# Patient Record
Sex: Female | Born: 1945
Health system: Southern US, Community
[De-identification: ages and names within clinical notes are randomized; demographics above are authoritative.]

## PROBLEM LIST (undated history)

## (undated) DIAGNOSIS — E538 Deficiency of other specified B group vitamins: Secondary | ICD-10-CM

## (undated) DIAGNOSIS — Z803 Family history of malignant neoplasm of breast: Secondary | ICD-10-CM

## (undated) DIAGNOSIS — K317 Polyp of stomach and duodenum: Secondary | ICD-10-CM

## (undated) DIAGNOSIS — C439 Malignant melanoma of skin, unspecified: Secondary | ICD-10-CM

## (undated) DIAGNOSIS — F32A Depression, unspecified: Secondary | ICD-10-CM

## (undated) DIAGNOSIS — Z8619 Personal history of other infectious and parasitic diseases: Secondary | ICD-10-CM

## (undated) DIAGNOSIS — T7840XA Allergy, unspecified, initial encounter: Secondary | ICD-10-CM

## (undated) DIAGNOSIS — F419 Anxiety disorder, unspecified: Secondary | ICD-10-CM

## (undated) DIAGNOSIS — L900 Lichen sclerosus et atrophicus: Secondary | ICD-10-CM

## (undated) DIAGNOSIS — M549 Dorsalgia, unspecified: Secondary | ICD-10-CM

## (undated) DIAGNOSIS — T4145XA Adverse effect of unspecified anesthetic, initial encounter: Secondary | ICD-10-CM

## (undated) DIAGNOSIS — C50912 Malignant neoplasm of unspecified site of left female breast: Secondary | ICD-10-CM

## (undated) DIAGNOSIS — C50911 Malignant neoplasm of unspecified site of right female breast: Secondary | ICD-10-CM

## (undated) DIAGNOSIS — G8929 Other chronic pain: Secondary | ICD-10-CM

## (undated) DIAGNOSIS — M1990 Unspecified osteoarthritis, unspecified site: Secondary | ICD-10-CM

## (undated) DIAGNOSIS — E559 Vitamin D deficiency, unspecified: Secondary | ICD-10-CM

## (undated) DIAGNOSIS — Z800 Family history of malignant neoplasm of digestive organs: Secondary | ICD-10-CM

## (undated) DIAGNOSIS — Z8719 Personal history of other diseases of the digestive system: Secondary | ICD-10-CM

## (undated) DIAGNOSIS — K219 Gastro-esophageal reflux disease without esophagitis: Secondary | ICD-10-CM

## (undated) DIAGNOSIS — F329 Major depressive disorder, single episode, unspecified: Secondary | ICD-10-CM

## (undated) DIAGNOSIS — Z86010 Personal history of colonic polyps: Secondary | ICD-10-CM

## (undated) DIAGNOSIS — N200 Calculus of kidney: Secondary | ICD-10-CM

## (undated) DIAGNOSIS — T8859XA Other complications of anesthesia, initial encounter: Secondary | ICD-10-CM

## (undated) DIAGNOSIS — R0600 Dyspnea, unspecified: Secondary | ICD-10-CM

## (undated) DIAGNOSIS — D759 Disease of blood and blood-forming organs, unspecified: Secondary | ICD-10-CM

## (undated) HISTORY — PX: COLONOSCOPY: SHX174

## (undated) HISTORY — DX: Personal history of other infectious and parasitic diseases: Z86.19

## (undated) HISTORY — DX: Dyspnea, unspecified: R06.00

## (undated) HISTORY — DX: Gastro-esophageal reflux disease without esophagitis: K21.9

## (undated) HISTORY — DX: Polyp of stomach and duodenum: K31.7

## (undated) HISTORY — DX: Family history of malignant neoplasm of breast: Z80.3

## (undated) HISTORY — DX: Major depressive disorder, single episode, unspecified: F32.9

## (undated) HISTORY — DX: Personal history of colonic polyps: Z86.010

## (undated) HISTORY — DX: Depression, unspecified: F32.A

## (undated) HISTORY — PX: HERNIA REPAIR: SHX51

## (undated) HISTORY — DX: Lichen sclerosus et atrophicus: L90.0

## (undated) HISTORY — PX: ESOPHAGOGASTRODUODENOSCOPY: SHX1529

## (undated) HISTORY — PX: BUNIONECTOMY: SHX129

## (undated) HISTORY — DX: Family history of malignant neoplasm of digestive organs: Z80.0

## (undated) HISTORY — DX: Allergy, unspecified, initial encounter: T78.40XA

## (undated) HISTORY — DX: Vitamin D deficiency, unspecified: E55.9

## (undated) HISTORY — DX: Malignant melanoma of skin, unspecified: C43.9

## (undated) HISTORY — DX: Deficiency of other specified B group vitamins: E53.8

## (undated) HISTORY — PX: TONSILLECTOMY: SUR1361

## (undated) HISTORY — DX: Calculus of kidney: N20.0

---

## 1985-05-27 HISTORY — PX: TEMPOROMANDIBULAR JOINT SURGERY: SHX35

## 1993-05-27 DIAGNOSIS — C50911 Malignant neoplasm of unspecified site of right female breast: Secondary | ICD-10-CM

## 1993-05-27 HISTORY — DX: Malignant neoplasm of unspecified site of right female breast: C50.911

## 1994-05-27 HISTORY — PX: MASTECTOMY: SHX3

## 1999-12-31 ENCOUNTER — Encounter: Admission: RE | Admit: 1999-12-31 | Discharge: 1999-12-31 | Payer: Self-pay | Admitting: Oncology

## 1999-12-31 ENCOUNTER — Encounter: Payer: Self-pay | Admitting: Oncology

## 2000-01-09 ENCOUNTER — Other Ambulatory Visit: Admission: RE | Admit: 2000-01-09 | Discharge: 2000-01-09 | Payer: Self-pay | Admitting: Obstetrics and Gynecology

## 2001-01-01 ENCOUNTER — Encounter: Payer: Self-pay | Admitting: Oncology

## 2001-01-01 ENCOUNTER — Encounter: Admission: RE | Admit: 2001-01-01 | Discharge: 2001-01-01 | Payer: Self-pay | Admitting: Oncology

## 2001-01-12 ENCOUNTER — Other Ambulatory Visit: Admission: RE | Admit: 2001-01-12 | Discharge: 2001-01-12 | Payer: Self-pay | Admitting: Obstetrics and Gynecology

## 2001-01-26 ENCOUNTER — Inpatient Hospital Stay (HOSPITAL_COMMUNITY): Admission: EM | Admit: 2001-01-26 | Discharge: 2001-01-29 | Payer: Self-pay | Admitting: Emergency Medicine

## 2001-01-26 ENCOUNTER — Encounter: Payer: Self-pay | Admitting: Internal Medicine

## 2001-01-26 ENCOUNTER — Encounter: Payer: Self-pay | Admitting: Emergency Medicine

## 2001-01-27 ENCOUNTER — Encounter: Payer: Self-pay | Admitting: Internal Medicine

## 2001-01-29 ENCOUNTER — Encounter: Payer: Self-pay | Admitting: Internal Medicine

## 2001-02-16 ENCOUNTER — Ambulatory Visit (HOSPITAL_COMMUNITY): Admission: RE | Admit: 2001-02-16 | Discharge: 2001-02-16 | Payer: Self-pay | Admitting: Gastroenterology

## 2001-02-16 ENCOUNTER — Encounter: Payer: Self-pay | Admitting: Gastroenterology

## 2001-04-02 ENCOUNTER — Encounter: Payer: Self-pay | Admitting: Gastroenterology

## 2001-04-02 ENCOUNTER — Ambulatory Visit (HOSPITAL_COMMUNITY): Admission: RE | Admit: 2001-04-02 | Discharge: 2001-04-02 | Payer: Self-pay | Admitting: Gastroenterology

## 2001-12-01 ENCOUNTER — Emergency Department (HOSPITAL_COMMUNITY): Admission: EM | Admit: 2001-12-01 | Discharge: 2001-12-01 | Payer: Self-pay | Admitting: Emergency Medicine

## 2001-12-01 ENCOUNTER — Encounter: Payer: Self-pay | Admitting: Emergency Medicine

## 2002-01-21 ENCOUNTER — Encounter: Admission: RE | Admit: 2002-01-21 | Discharge: 2002-01-21 | Payer: Self-pay | Admitting: *Deleted

## 2002-01-21 ENCOUNTER — Encounter: Payer: Self-pay | Admitting: *Deleted

## 2002-06-02 ENCOUNTER — Other Ambulatory Visit: Admission: RE | Admit: 2002-06-02 | Discharge: 2002-06-02 | Payer: Self-pay | Admitting: Obstetrics and Gynecology

## 2002-11-12 ENCOUNTER — Ambulatory Visit: Admission: RE | Admit: 2002-11-12 | Discharge: 2002-11-12 | Payer: Self-pay | Admitting: Internal Medicine

## 2003-01-24 ENCOUNTER — Encounter: Admission: RE | Admit: 2003-01-24 | Discharge: 2003-01-24 | Payer: Self-pay | Admitting: Surgery

## 2003-01-24 ENCOUNTER — Encounter: Payer: Self-pay | Admitting: Surgery

## 2003-04-13 ENCOUNTER — Encounter: Payer: Self-pay | Admitting: Gastroenterology

## 2003-05-09 ENCOUNTER — Encounter: Payer: Self-pay | Admitting: Gastroenterology

## 2003-06-06 ENCOUNTER — Other Ambulatory Visit: Admission: RE | Admit: 2003-06-06 | Discharge: 2003-06-06 | Payer: Self-pay | Admitting: Obstetrics and Gynecology

## 2003-08-31 ENCOUNTER — Encounter: Admission: RE | Admit: 2003-08-31 | Discharge: 2003-08-31 | Payer: Self-pay | Admitting: Internal Medicine

## 2004-02-14 ENCOUNTER — Encounter: Admission: RE | Admit: 2004-02-14 | Discharge: 2004-02-14 | Payer: Self-pay | Admitting: Obstetrics and Gynecology

## 2004-04-12 ENCOUNTER — Ambulatory Visit: Payer: Self-pay | Admitting: Internal Medicine

## 2004-09-27 ENCOUNTER — Other Ambulatory Visit: Admission: RE | Admit: 2004-09-27 | Discharge: 2004-09-27 | Payer: Self-pay | Admitting: Obstetrics and Gynecology

## 2004-11-21 ENCOUNTER — Ambulatory Visit: Payer: Self-pay | Admitting: Internal Medicine

## 2005-03-20 ENCOUNTER — Ambulatory Visit: Payer: Self-pay | Admitting: Internal Medicine

## 2005-04-16 ENCOUNTER — Encounter: Admission: RE | Admit: 2005-04-16 | Discharge: 2005-04-16 | Payer: Self-pay | Admitting: General Surgery

## 2005-10-22 ENCOUNTER — Emergency Department (HOSPITAL_COMMUNITY): Admission: EM | Admit: 2005-10-22 | Discharge: 2005-10-22 | Payer: Self-pay | Admitting: *Deleted

## 2006-01-17 ENCOUNTER — Encounter (INDEPENDENT_AMBULATORY_CARE_PROVIDER_SITE_OTHER): Payer: Self-pay | Admitting: *Deleted

## 2006-01-17 ENCOUNTER — Ambulatory Visit (HOSPITAL_BASED_OUTPATIENT_CLINIC_OR_DEPARTMENT_OTHER): Admission: RE | Admit: 2006-01-17 | Discharge: 2006-01-17 | Payer: Self-pay | Admitting: Orthopedic Surgery

## 2006-03-20 ENCOUNTER — Ambulatory Visit: Payer: Self-pay | Admitting: Endocrinology

## 2006-04-01 ENCOUNTER — Ambulatory Visit: Payer: Self-pay | Admitting: Internal Medicine

## 2006-04-23 ENCOUNTER — Encounter: Admission: RE | Admit: 2006-04-23 | Discharge: 2006-04-23 | Payer: Self-pay | Admitting: General Surgery

## 2006-07-01 ENCOUNTER — Ambulatory Visit: Payer: Self-pay | Admitting: Internal Medicine

## 2006-09-19 ENCOUNTER — Ambulatory Visit: Payer: Self-pay

## 2006-09-19 ENCOUNTER — Ambulatory Visit: Payer: Self-pay | Admitting: Internal Medicine

## 2006-09-24 ENCOUNTER — Encounter: Admission: RE | Admit: 2006-09-24 | Discharge: 2006-09-24 | Payer: Self-pay | Admitting: Internal Medicine

## 2006-10-16 ENCOUNTER — Ambulatory Visit: Payer: Self-pay | Admitting: Internal Medicine

## 2006-10-16 LAB — CONVERTED CEMR LAB
ALT: 22 units/L (ref 0–40)
CO2: 32 meq/L (ref 19–32)
GFR calc non Af Amer: 78 mL/min
Glucose, Bld: 81 mg/dL (ref 70–99)
Potassium: 3.7 meq/L (ref 3.5–5.1)
Sodium: 143 meq/L (ref 135–145)
Vit D, 1,25-Dihydroxy: 24 (ref 20–57)

## 2006-11-03 ENCOUNTER — Encounter: Admission: RE | Admit: 2006-11-03 | Discharge: 2006-11-03 | Payer: Self-pay | Admitting: Obstetrics and Gynecology

## 2006-12-10 ENCOUNTER — Ambulatory Visit: Payer: Self-pay | Admitting: Internal Medicine

## 2007-02-27 ENCOUNTER — Encounter: Payer: Self-pay | Admitting: *Deleted

## 2007-02-27 DIAGNOSIS — K219 Gastro-esophageal reflux disease without esophagitis: Secondary | ICD-10-CM

## 2007-02-27 DIAGNOSIS — M797 Fibromyalgia: Secondary | ICD-10-CM

## 2007-02-27 DIAGNOSIS — Z853 Personal history of malignant neoplasm of breast: Secondary | ICD-10-CM | POA: Insufficient documentation

## 2007-02-27 DIAGNOSIS — I10 Essential (primary) hypertension: Secondary | ICD-10-CM | POA: Insufficient documentation

## 2007-02-27 DIAGNOSIS — E538 Deficiency of other specified B group vitamins: Secondary | ICD-10-CM

## 2007-02-27 HISTORY — DX: Fibromyalgia: M79.7

## 2007-02-27 HISTORY — DX: Essential (primary) hypertension: I10

## 2007-02-27 HISTORY — DX: Deficiency of other specified B group vitamins: E53.8

## 2007-02-27 HISTORY — DX: Gastro-esophageal reflux disease without esophagitis: K21.9

## 2007-04-14 ENCOUNTER — Ambulatory Visit: Payer: Self-pay | Admitting: Internal Medicine

## 2007-04-14 DIAGNOSIS — E559 Vitamin D deficiency, unspecified: Secondary | ICD-10-CM | POA: Insufficient documentation

## 2007-04-27 ENCOUNTER — Encounter: Admission: RE | Admit: 2007-04-27 | Discharge: 2007-04-27 | Payer: Self-pay | Admitting: Obstetrics and Gynecology

## 2007-09-03 ENCOUNTER — Emergency Department (HOSPITAL_COMMUNITY): Admission: EM | Admit: 2007-09-03 | Discharge: 2007-09-04 | Payer: Self-pay | Admitting: Emergency Medicine

## 2007-09-15 ENCOUNTER — Telehealth: Payer: Self-pay | Admitting: Internal Medicine

## 2007-09-15 DIAGNOSIS — T783XXA Angioneurotic edema, initial encounter: Secondary | ICD-10-CM | POA: Insufficient documentation

## 2007-09-17 ENCOUNTER — Ambulatory Visit: Payer: Self-pay | Admitting: Internal Medicine

## 2007-09-17 DIAGNOSIS — R21 Rash and other nonspecific skin eruption: Secondary | ICD-10-CM | POA: Insufficient documentation

## 2007-10-08 ENCOUNTER — Ambulatory Visit: Payer: Self-pay | Admitting: Internal Medicine

## 2007-10-08 LAB — CONVERTED CEMR LAB
ALT: 28 units/L (ref 0–35)
BUN: 10 mg/dL (ref 6–23)
Calcium: 9.4 mg/dL (ref 8.4–10.5)
Eosinophils Relative: 5.3 % — ABNORMAL HIGH (ref 0.0–5.0)
GFR calc Af Amer: 82 mL/min
GFR calc non Af Amer: 68 mL/min
HDL: 33.8 mg/dL — ABNORMAL LOW (ref >39.0)
Hemoglobin: 14.5 g/dL (ref 12.0–15.0)
LDL Cholesterol: 145 mg/dL — ABNORMAL HIGH (ref 0–99)
Monocytes Relative: 14.2 % — ABNORMAL HIGH (ref 3.0–12.0)
Platelets: 179 10*3/uL (ref 150–400)
Potassium: 4.2 meq/L (ref 3.5–5.1)
RDW: 12.2 % (ref 11.5–14.6)
Total CHOL/HDL Ratio: 5.7
VLDL: 15 mg/dL (ref 0–40)
Vitamin B-12: 1166 pg/mL — ABNORMAL HIGH (ref 211–911)
WBC: 3.6 10*3/uL — ABNORMAL LOW (ref 4.5–10.5)

## 2007-10-13 ENCOUNTER — Ambulatory Visit: Payer: Self-pay | Admitting: Internal Medicine

## 2007-11-16 ENCOUNTER — Ambulatory Visit: Payer: Self-pay | Admitting: Cardiovascular Disease

## 2007-11-25 ENCOUNTER — Ambulatory Visit: Payer: Self-pay | Admitting: Internal Medicine

## 2007-11-25 ENCOUNTER — Ambulatory Visit: Payer: Self-pay

## 2007-11-25 ENCOUNTER — Encounter: Payer: Self-pay | Admitting: Cardiovascular Disease

## 2007-12-07 ENCOUNTER — Ambulatory Visit: Payer: Self-pay | Admitting: Internal Medicine

## 2007-12-07 DIAGNOSIS — R053 Chronic cough: Secondary | ICD-10-CM | POA: Insufficient documentation

## 2007-12-07 DIAGNOSIS — R05 Cough: Secondary | ICD-10-CM

## 2007-12-07 HISTORY — DX: Chronic cough: R05.3

## 2007-12-22 ENCOUNTER — Ambulatory Visit: Payer: Self-pay | Admitting: Internal Medicine

## 2007-12-22 ENCOUNTER — Encounter: Payer: Self-pay | Admitting: Adult Health

## 2007-12-23 LAB — CONVERTED CEMR LAB
BUN: 15 mg/dL (ref 6–23)
Calcium: 9.3 mg/dL (ref 8.4–10.5)
GFR calc Af Amer: 94 mL/min
GFR calc non Af Amer: 78 mL/min
Glucose, Bld: 92 mg/dL (ref 70–99)
Pro B Natriuretic peptide (BNP): 61 pg/mL (ref 0.0–100.0)
Sed Rate: 13 mm/hr (ref 0–22)
Sodium: 140 meq/L (ref 135–145)

## 2008-01-04 ENCOUNTER — Ambulatory Visit: Payer: Self-pay | Admitting: Internal Medicine

## 2008-01-04 DIAGNOSIS — R61 Generalized hyperhidrosis: Secondary | ICD-10-CM | POA: Insufficient documentation

## 2008-01-04 DIAGNOSIS — R930 Abnormal findings on diagnostic imaging of skull and head, not elsewhere classified: Secondary | ICD-10-CM | POA: Insufficient documentation

## 2008-01-04 HISTORY — DX: Abnormal findings on diagnostic imaging of skull and head, not elsewhere classified: R93.0

## 2008-01-19 ENCOUNTER — Ambulatory Visit: Payer: Self-pay | Admitting: Internal Medicine

## 2008-03-09 ENCOUNTER — Emergency Department (HOSPITAL_BASED_OUTPATIENT_CLINIC_OR_DEPARTMENT_OTHER): Admission: EM | Admit: 2008-03-09 | Discharge: 2008-03-09 | Payer: Self-pay | Admitting: Emergency Medicine

## 2008-03-16 ENCOUNTER — Ambulatory Visit: Payer: Self-pay | Admitting: Internal Medicine

## 2008-03-16 DIAGNOSIS — J209 Acute bronchitis, unspecified: Secondary | ICD-10-CM | POA: Insufficient documentation

## 2008-03-16 DIAGNOSIS — J45901 Unspecified asthma with (acute) exacerbation: Secondary | ICD-10-CM | POA: Insufficient documentation

## 2008-03-28 ENCOUNTER — Encounter: Payer: Self-pay | Admitting: Internal Medicine

## 2008-03-28 ENCOUNTER — Ambulatory Visit: Payer: Self-pay | Admitting: Internal Medicine

## 2008-03-29 ENCOUNTER — Ambulatory Visit: Payer: Self-pay | Admitting: Internal Medicine

## 2008-04-18 ENCOUNTER — Telehealth: Payer: Self-pay | Admitting: Internal Medicine

## 2008-04-28 ENCOUNTER — Encounter: Admission: RE | Admit: 2008-04-28 | Discharge: 2008-04-28 | Payer: Self-pay | Admitting: Obstetrics and Gynecology

## 2008-05-27 DIAGNOSIS — C439 Malignant melanoma of skin, unspecified: Secondary | ICD-10-CM

## 2008-05-27 HISTORY — PX: MELANOMA EXCISION: SHX5266

## 2008-05-27 HISTORY — DX: Malignant melanoma of skin, unspecified: C43.9

## 2008-05-30 ENCOUNTER — Telehealth: Payer: Self-pay | Admitting: Internal Medicine

## 2008-06-09 ENCOUNTER — Telehealth: Payer: Self-pay | Admitting: Internal Medicine

## 2008-06-13 DIAGNOSIS — R609 Edema, unspecified: Secondary | ICD-10-CM | POA: Insufficient documentation

## 2008-06-13 DIAGNOSIS — R0602 Shortness of breath: Secondary | ICD-10-CM | POA: Insufficient documentation

## 2008-06-14 ENCOUNTER — Ambulatory Visit: Payer: Self-pay | Admitting: Cardiology

## 2008-07-11 DIAGNOSIS — K449 Diaphragmatic hernia without obstruction or gangrene: Secondary | ICD-10-CM | POA: Insufficient documentation

## 2008-07-11 DIAGNOSIS — K222 Esophageal obstruction: Secondary | ICD-10-CM | POA: Insufficient documentation

## 2008-07-12 ENCOUNTER — Ambulatory Visit: Payer: Self-pay | Admitting: Gastroenterology

## 2008-07-12 DIAGNOSIS — D689 Coagulation defect, unspecified: Secondary | ICD-10-CM

## 2008-07-12 DIAGNOSIS — K5900 Constipation, unspecified: Secondary | ICD-10-CM | POA: Insufficient documentation

## 2008-07-12 HISTORY — DX: Coagulation defect, unspecified: D68.9

## 2008-07-12 LAB — CONVERTED CEMR LAB
BUN: 17 mg/dL (ref 6–23)
Basophils Relative: 1 % (ref 0.0–3.0)
Bilirubin, Direct: 0.1 mg/dL (ref 0.0–0.3)
Eosinophils Relative: 4.6 % (ref 0.0–5.0)
Ferritin: 200.8 ng/mL (ref 10.0–291.0)
Folate: 11.3 ng/mL
GFR calc non Af Amer: 77 mL/min
Glucose, Bld: 98 mg/dL (ref 70–99)
MCHC: 35.3 g/dL (ref 30.0–36.0)
Monocytes Relative: 7.7 % (ref 3.0–12.0)
Neutrophils Relative %: 64.7 % (ref 43.0–77.0)
Platelets: 176 10*3/uL (ref 150–400)
Saturation Ratios: 24.7 % (ref 20.0–50.0)
TSH: 1.02 microintl units/mL (ref 0.35–5.50)
Total Bilirubin: 0.6 mg/dL (ref 0.3–1.2)
Transferrin: 246.3 mg/dL (ref >=212.0)
Vitamin B-12: 1354 pg/mL — ABNORMAL HIGH (ref 211–911)

## 2008-07-13 ENCOUNTER — Telehealth: Payer: Self-pay | Admitting: Internal Medicine

## 2008-07-19 ENCOUNTER — Ambulatory Visit (HOSPITAL_COMMUNITY): Admission: RE | Admit: 2008-07-19 | Discharge: 2008-07-19 | Payer: Self-pay | Admitting: Gastroenterology

## 2008-07-22 ENCOUNTER — Telehealth: Payer: Self-pay | Admitting: Gastroenterology

## 2008-07-25 ENCOUNTER — Ambulatory Visit: Payer: Self-pay | Admitting: Gastroenterology

## 2008-07-25 ENCOUNTER — Encounter: Payer: Self-pay | Admitting: Gastroenterology

## 2008-07-25 LAB — HM COLONOSCOPY

## 2008-07-27 ENCOUNTER — Encounter: Payer: Self-pay | Admitting: Gastroenterology

## 2008-07-28 ENCOUNTER — Telehealth: Payer: Self-pay | Admitting: Gastroenterology

## 2008-08-02 ENCOUNTER — Ambulatory Visit (HOSPITAL_COMMUNITY): Admission: RE | Admit: 2008-08-02 | Discharge: 2008-08-02 | Payer: Self-pay | Admitting: Gastroenterology

## 2008-08-12 ENCOUNTER — Encounter: Payer: Self-pay | Admitting: Gastroenterology

## 2008-09-12 ENCOUNTER — Telehealth: Payer: Self-pay | Admitting: Gastroenterology

## 2008-09-12 ENCOUNTER — Telehealth: Payer: Self-pay | Admitting: Internal Medicine

## 2008-09-24 HISTORY — PX: NISSEN FUNDOPLICATION: SHX2091

## 2008-10-07 ENCOUNTER — Ambulatory Visit (HOSPITAL_COMMUNITY): Admission: RE | Admit: 2008-10-07 | Discharge: 2008-10-09 | Payer: Self-pay | Admitting: Surgery

## 2009-01-11 ENCOUNTER — Encounter: Payer: Self-pay | Admitting: Internal Medicine

## 2009-02-07 ENCOUNTER — Telehealth: Payer: Self-pay | Admitting: Gastroenterology

## 2009-02-24 ENCOUNTER — Ambulatory Visit: Payer: Self-pay | Admitting: Diagnostic Radiology

## 2009-02-24 ENCOUNTER — Emergency Department (HOSPITAL_BASED_OUTPATIENT_CLINIC_OR_DEPARTMENT_OTHER): Admission: EM | Admit: 2009-02-24 | Discharge: 2009-02-25 | Payer: Self-pay | Admitting: Emergency Medicine

## 2009-02-27 ENCOUNTER — Telehealth: Payer: Self-pay | Admitting: Gastroenterology

## 2009-02-28 ENCOUNTER — Ambulatory Visit: Payer: Self-pay | Admitting: Gastroenterology

## 2009-02-28 DIAGNOSIS — R1310 Dysphagia, unspecified: Secondary | ICD-10-CM | POA: Insufficient documentation

## 2009-02-28 DIAGNOSIS — R079 Chest pain, unspecified: Secondary | ICD-10-CM | POA: Insufficient documentation

## 2009-03-01 ENCOUNTER — Ambulatory Visit (HOSPITAL_COMMUNITY): Admission: RE | Admit: 2009-03-01 | Discharge: 2009-03-01 | Payer: Self-pay | Admitting: Gastroenterology

## 2009-03-01 ENCOUNTER — Ambulatory Visit: Payer: Self-pay | Admitting: Gastroenterology

## 2009-05-02 ENCOUNTER — Encounter: Admission: RE | Admit: 2009-05-02 | Discharge: 2009-05-02 | Payer: Self-pay | Admitting: Obstetrics and Gynecology

## 2009-05-17 ENCOUNTER — Encounter: Admission: RE | Admit: 2009-05-17 | Discharge: 2009-05-17 | Payer: Self-pay | Admitting: Obstetrics and Gynecology

## 2009-11-21 ENCOUNTER — Ambulatory Visit: Payer: Self-pay | Admitting: Gastroenterology

## 2009-11-21 LAB — CONVERTED CEMR LAB
ALT: 19 units/L (ref 0–35)
Albumin: 4.3 g/dL (ref 3.5–5.2)
BUN: 14 mg/dL (ref 6–23)
Basophils Absolute: 0 10*3/uL (ref 0.0–0.1)
Bilirubin, Direct: 0.1 mg/dL (ref 0.0–0.3)
CO2: 32 meq/L (ref 19–32)
Chloride: 110 meq/L (ref 96–112)
Creatinine, Ser: 0.8 mg/dL (ref 0.4–1.2)
Eosinophils Absolute: 0.2 10*3/uL (ref 0.0–0.7)
Eosinophils Relative: 2.5 % (ref 0.0–5.0)
Glucose, Bld: 74 mg/dL (ref 70–99)
INR: 1 (ref 0.8–1.0)
MCHC: 35.2 g/dL (ref 30.0–36.0)
MCV: 91.1 fL (ref 78.0–100.0)
Monocytes Absolute: 0.4 10*3/uL (ref 0.1–1.0)
Neutrophils Relative %: 68.8 % (ref 43.0–77.0)
Platelets: 174 10*3/uL (ref 150.0–400.0)
Prothrombin Time: 10.4 s (ref 9.7–11.8)
RDW: 12.9 % (ref 11.5–14.6)
Sodium: 147 meq/L — ABNORMAL HIGH (ref 135–145)
TSH: 1.15 microintl units/mL (ref 0.35–5.50)
Total Protein: 7.1 g/dL (ref 6.0–8.3)
Transferrin: 251.8 mg/dL (ref 212.0–360.0)
Vitamin B-12: 429 pg/mL (ref 211–911)
WBC: 6.8 10*3/uL (ref 4.5–10.5)

## 2009-12-07 ENCOUNTER — Ambulatory Visit: Payer: Self-pay | Admitting: Gastroenterology

## 2009-12-07 DIAGNOSIS — K760 Fatty (change of) liver, not elsewhere classified: Secondary | ICD-10-CM

## 2009-12-07 DIAGNOSIS — K7689 Other specified diseases of liver: Secondary | ICD-10-CM

## 2009-12-07 HISTORY — DX: Fatty (change of) liver, not elsewhere classified: K76.0

## 2009-12-12 ENCOUNTER — Ambulatory Visit (HOSPITAL_COMMUNITY): Admission: RE | Admit: 2009-12-12 | Discharge: 2009-12-12 | Payer: Self-pay | Admitting: Gastroenterology

## 2010-01-11 ENCOUNTER — Ambulatory Visit: Payer: Self-pay | Admitting: Internal Medicine

## 2010-01-11 DIAGNOSIS — L255 Unspecified contact dermatitis due to plants, except food: Secondary | ICD-10-CM | POA: Insufficient documentation

## 2010-03-26 ENCOUNTER — Telehealth: Payer: Self-pay | Admitting: Gastroenterology

## 2010-05-03 ENCOUNTER — Encounter
Admission: RE | Admit: 2010-05-03 | Discharge: 2010-05-03 | Payer: Self-pay | Source: Home / Self Care | Attending: Obstetrics and Gynecology | Admitting: Obstetrics and Gynecology

## 2010-05-17 ENCOUNTER — Telehealth: Payer: Self-pay | Admitting: Internal Medicine

## 2010-05-22 ENCOUNTER — Ambulatory Visit: Payer: Self-pay | Admitting: Internal Medicine

## 2010-05-22 ENCOUNTER — Encounter: Payer: Self-pay | Admitting: Internal Medicine

## 2010-05-22 DIAGNOSIS — F329 Major depressive disorder, single episode, unspecified: Secondary | ICD-10-CM

## 2010-05-22 DIAGNOSIS — F32A Depression, unspecified: Secondary | ICD-10-CM | POA: Insufficient documentation

## 2010-05-22 LAB — CONVERTED CEMR LAB: Vit D, 25-Hydroxy: 30 ng/mL (ref 30–89)

## 2010-05-24 LAB — CONVERTED CEMR LAB
AST: 29 units/L (ref 0–37)
Alkaline Phosphatase: 71 units/L (ref 39–117)
Basophils Absolute: 0 10*3/uL (ref 0.0–0.1)
Basophils Relative: 0.8 % (ref 0.0–3.0)
Bilirubin Urine: NEGATIVE
Bilirubin, Direct: 0.1 mg/dL (ref 0.0–0.3)
Blood, UA: NEGATIVE
CO2: 29 meq/L (ref 19–32)
Calcium: 9 mg/dL (ref 8.4–10.5)
Creatinine, Ser: 0.7 mg/dL (ref 0.4–1.2)
Eosinophils Absolute: 0.2 10*3/uL (ref 0.0–0.7)
GFR calc non Af Amer: 86.59 mL/min (ref >60.00)
HDL: 46.4 mg/dL (ref >39.00)
Hemoglobin: 14.8 g/dL (ref 12.0–15.0)
Leukocytes, UA: NEGATIVE
Lymphocytes Relative: 22.9 % (ref 12.0–46.0)
MCHC: 34.8 g/dL (ref 30.0–36.0)
Monocytes Relative: 7.1 % (ref 3.0–12.0)
Neutrophils Relative %: 65.9 % (ref 43.0–77.0)
Nitrite: NEGATIVE
RBC: 4.53 M/uL (ref 3.87–5.11)
RDW: 13 % (ref 11.5–14.6)
Sodium: 142 meq/L (ref 135–145)
TSH: 1.57 microintl units/mL (ref 0.35–5.50)
Urobilinogen, UA: 0.2 (ref 0.0–1.0)
VLDL: 20 mg/dL (ref 0.0–40.0)
Vitamin B-12: 463 pg/mL (ref 211–911)

## 2010-06-17 ENCOUNTER — Encounter: Payer: Self-pay | Admitting: Obstetrics and Gynecology

## 2010-06-26 NOTE — Assessment & Plan Note (Signed)
Summary: 2 WEEK F/U..AM.   History of Present Illness Visit Type: Follow-up Visit Primary GI MD: Sheryn Bison MD FACP FAGA Primary Provider: Sonda Primes MD Chief Complaint: Hoarness, coughing & dysphagia has improved History of Present Illness:   No current symptomatology on Exelon 60 mg a day, Bentyl 10 mg t.i.d., and p.r.n. Carafate suspension. Review of labs from previous visit show them to be unremarkable. She is status post fundoplication. Her dysphagia has resolved.   GI Review of Systems      Denies abdominal pain, acid reflux, belching, bloating, chest pain, dysphagia with liquids, dysphagia with solids, heartburn, loss of appetite, nausea, vomiting, vomiting blood, weight loss, and  weight gain.        Denies anal fissure, black tarry stools, change in bowel habit, constipation, diarrhea, diverticulosis, fecal incontinence, heme positive stool, hemorrhoids, irritable bowel syndrome, jaundice, light color stool, liver problems, rectal bleeding, and  rectal pain.    Current Medications (verified): 1)  Klor-Con M20 20 Meq  Tbcr (Potassium Chloride Crys Cr) .... Take 1 By Mouth Qd 2)  Hyzaar 100-12.5 Mg Tabs (Losartan Potassium-Hctz) .Marland Kitchen.. 1 By Mouth Once Daily 3)  Furosemide 40 Mg Tabs (Furosemide) .... Take One Tablet Daily 4)  Vitamin B-12 Cr 1000 Mcg  Tbcr (Cyanocobalamin) .... Take One Tablet By Mouth Daily 5)  Vitamin D3 1000 Unit  Tabs (Cholecalciferol) .Marland Kitchen.. 1 By Mouth Daily 6)  Red Yeast Rice 600 Mg Tabs (Red Yeast Rice Extract) .... Two Times A Day 7)  Dexilant 60 Mg Cpdr (Dexlansoprazole) .Marland Kitchen.. 1 By Mouth Qd 8)  Carafate 1 Gm/40ml  Susp (Sucralfate) .Marland Kitchen.. 1 Gm Q Hs and As Needed 9)  Bentyl 10 Mg  Caps (Dicyclomine Hcl) .Marland Kitchen.. 1 Po Three Times A Day Before Meals  Allergies (verified): 1)  ! Codeine 2)  ! Sulfa 3)  ! Ace Inhibitors 4)  ! * Shellfish 5)  * Micardis  Past History:  Past medical, surgical, family and social histories (including risk factors)  reviewed for relevance to current acute and chronic problems.  Past Medical History: Reviewed history from 06/13/2008 and no changes required. GERD Hypertension Breast cancer, hx of 1996 Vit D def FMS Vit B12 def Gyn   Dr Misinger Post Inflamatory Lung nodules Dyspnea: normal spirometry 11/09 EF 65% BNP normal 7/09  Past Surgical History: Reviewed history from 02/28/2009 and no changes required. 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 Family History of Stroke F 1st degree relative at 20 Family History of Stroke M 1st degree relative   Social History: Reviewed history from 02/28/2009 and no changes required. Occupation: Radiation protection practitioner, 2 children Never Smoked Alcohol use-no Drug use-no Regular exercise-yes Divorced  Review of Systems       The patient complains of cough, fatigue, and shortness of breath.  The patient denies allergy/sinus, anemia, anxiety-new, arthritis/joint pain, back pain, blood in urine, breast changes/lumps, change in vision, confusion, coughing up blood, depression-new, fainting, fever, headaches-new, hearing problems, heart murmur, heart rhythm changes, itching, menstrual pain, muscle pains/cramps, night sweats, nosebleeds, pregnancy symptoms, skin rash, sleeping problems, sore throat, swelling of feet/legs, swollen lymph glands, thirst - excessive , urination - excessive , urination changes/pain, urine leakage, vision changes, and voice change.    Vital Signs:  Patient profile:   65 year old female Height:      4  inches Weight:      195.25 pounds BMI:     28.94 Pulse rate:   80 / minute Pulse rhythm:   regular BP sitting:   142 / 86  (left arm) Cuff size:   regular  Vitals Entered By: June McMurray CMA Duncan Dull) (December 07, 2009 10:20 AM)  Physical Exam  General:  Well  developed, well nourished, no acute distress.healthy appearing.   Head:  Normocephalic and atraumatic. Eyes:  PERRLA, no icterus.exam deferred to patient's ophthalmologist.   Psych:  Alert and cooperative. Normal mood and affect.   Impression & Recommendations:  Problem # 1:  DYSPHAGIA UNSPECIFIED (ICD-787.20) Assessment Improved Continue Dexilant to 60 mg a day and decrease Bentyl and Carafate p.r.n. usage. Gallbladder ultrasound ordered to complete her workup.Possible element of delayed gastric emptying, gas-blood syndrome versus recurrent GERD.  Problem # 2:  CHEST PAIN (ICD-786.50) Assessment: Improved  Problem # 3:  CONSTIPATION (ICD-564.00) Assessment: Improved  Patient Instructions: 1)  You are scheduled for an Ultrasound. 2)  Please continue current medications.  3)  The medication list was reviewed and reconciled.  All changed / newly prescribed medications were explained.  A complete medication list was provided to the patient / caregiver. 4)  Copy sent to : Dr. Trinna Post Plotnikov and Dr. Luretha Murphy  Appended Document: 2 WEEK F/U..AM.    Clinical Lists Changes  Problems: Added new problem of FATTY LIVER DISEASE (ICD-571.8) Orders: Added new Test order of Ultrasound Abdomen (UAS) - Signed

## 2010-06-26 NOTE — Assessment & Plan Note (Signed)
Summary: problems w. esophagus...em   History of Present Illness Visit Type: Follow-up Visit Primary GI MD: Sheryn Bison MD FACP FAGA Primary Provider: Sonda Primes MD Chief Complaint: barrett's esophagus, episodes of hoarseness, coughing, swallowing History of Present Illness:   65 year old Caucasian female who had fundoplication surgery a year ago with marked symptomatic improvement her chronic acid reflux. She did well for one year but has had recurrent spasmodic substernal chest pain over the last month without true reflux symptoms. Associated with the spasmodic episodes his dysphagia for solids and liquids. She denies use of antibiotics, NSAIDs, or abuse of alcohol or cigarettes. She actually had endoscopy in October of this past year which was unremarkable.Previous esophageal manometry was normal in 2002 without evidence of esophageal spasm.  The patient has had recent negative ultrasound exam. She denies any hepatobiliary or lower GI problems at this time.Despite these complaints her after that is good and her weight is stable. She denies specific cardiovascular or pulmonary symptoms. She does not smoke or use ethanol.   GI Review of Systems    Reports abdominal pain, bloating, dysphagia with liquids, dysphagia with solids, loss of appetite, and  nausea.     Location of  Abdominal pain: epigastric area.    Denies acid reflux, belching, chest pain, heartburn, vomiting, vomiting blood, weight loss, and  weight gain.        Denies anal fissure, black tarry stools, change in bowel habit, constipation, diarrhea, diverticulosis, fecal incontinence, heme positive stool, hemorrhoids, irritable bowel syndrome, jaundice, light color stool, liver problems, rectal bleeding, and  rectal pain. Anticoagulation Management History:      Negative risk factors for bleeding include an age less than 70 years old and no history of CVA/TIA.  The bleeding index is 'low risk'.  Positive CHADS2 values  include History of HTN.  Negative CHADS2 values include Age > 22 years old, History of Diabetes, and Prior Stroke/CVA/TIA.       Current Medications (verified): 1)  Klor-Con M20 20 Meq  Tbcr (Potassium Chloride Crys Cr) .... Take 1 By Mouth Qd 2)  Hyzaar 100-12.5 Mg Tabs (Losartan Potassium-Hctz) .Marland Kitchen.. 1 By Mouth Once Daily 3)  Furosemide 40 Mg Tabs (Furosemide) .... Take One Tablet Daily 4)  Vitamin B-12 Cr 1000 Mcg  Tbcr (Cyanocobalamin) .... Take One Tablet By Mouth Daily 5)  Vitamin D3 1000 Unit  Tabs (Cholecalciferol) .Marland Kitchen.. 1 By Mouth Daily 6)  Red Yeast Rice 600 Mg Tabs (Red Yeast Rice Extract) .... Two Times A Day  Allergies (verified): 1)  ! Codeine 2)  ! Sulfa 3)  ! Ace Inhibitors 4)  ! * Shellfish 5)  * Micardis  Past History:  Past medical, surgical, family and social histories (including risk factors) reviewed for relevance to current acute and chronic problems.  Past Medical History: Reviewed history from 06/13/2008 and no changes required. GERD Hypertension Breast cancer, hx of 1996 Vit D def FMS Vit B12 def Gyn   Dr Misinger Post Inflamatory Lung nodules Dyspnea: normal spirometry 11/09 EF 65% BNP normal 7/09  Past Surgical History: Reviewed history from 02/28/2009 and no changes required. 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 Family History of Stroke F 1st degree relative at 52 Family History of Stroke M 1st degree relative   Social History: Reviewed history from 02/28/2009 and no changes required. Occupation: Production designer, theatre/television/film  Single, 2 children Never Smoked Alcohol use-no Drug use-no Regular exercise-yes Divorced  Review of Systems       The patient complains of arthritis/joint pain, back pain, change in vision, cough, fatigue, heart rhythm changes, muscle pains/cramps,  sleeping problems, thirst - excessive, and voice change.  The patient denies allergy/sinus, anemia, anxiety-new, blood in urine, breast changes/lumps, confusion, coughing up blood, depression-new, fainting, fever, headaches-new, hearing problems, heart murmur, itching, menstrual pain, night sweats, nosebleeds, pregnancy symptoms, shortness of breath, skin rash, sore throat, swelling of feet/legs, swollen lymph glands, thirst - excessive , urination - excessive , urination changes/pain, urine leakage, and vision changes.    Vital Signs:  Patient profile:   64 year old female Height:      69 inches Weight:      192.50 pounds BMI:     28.53 Pulse rate:   72 / minute Pulse rhythm:   regular BP sitting:   138 / 82  (left arm) Cuff size:   regular  Vitals Entered By: June McMurray CMA Duncan Dull) (November 21, 2009 2:20 PM)  Physical Exam  General:  Well developed, well nourished, no acute distress.healthy appearing.   Head:  Normocephalic and atraumatic. Eyes:  PERRLA, no icterus.exam deferred to patient's ophthalmologist.   Neck:  Supple; no masses or thyromegaly. Lungs:  Clear throughout to auscultation. Heart:  Regular rate and rhythm; no murmurs, rubs,  or bruits. Abdomen:  Soft, nontender and nondistended. No masses, hepatosplenomegaly or hernias noted. Normal bowel sounds. Extremities:  No clubbing, cyanosis, edema or deformities noted. Neurologic:  Alert and  oriented x4;  grossly normal neurologically. Cervical Nodes:  No significant cervical adenopathy. Psych:  Alert and cooperative. Normal mood and affect.   Impression & Recommendations:  Problem # 1:  DYSPHAGIA UNSPECIFIED (ICD-787.20) Assessment Deteriorated Unusual her current symptomatology---probable occult acid reflux with secondary spasm. I have started Dexilant 60 mg a day along with Bentyl 10 mg t.i.d. before meals, 2 tablespoons of Carafate suspension at bedtime with p.r.n. use during the day, and standard antireflux  maneuvers. I'll see her back in several weeks' time for followup. She may need followup manometry and 24-hour pH probe testing. As mentioned previously, she had endoscopy in October of last year. Also previous barium swallows and upper GI series that showed no other abnormalities. She could have an element of delayed gastric emptying complicating her problems.  Problem # 2:  CONSTIPATION (ICD-564.00) Assessment: Improved high-fiber diet as tolerated.  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 Labs and prothrombin time have been ordered. I cannot see that she is taking salicylates or other anticoagulants.  Other Orders: TLB-CBC Platelet - w/Differential (85025-CBCD) TLB-BMP (Basic Metabolic Panel-BMET) (80048-METABOL) TLB-Hepatic/Liver Function Pnl (80076-HEPATIC) TLB-TSH (Thyroid Stimulating Hormone) (84443-TSH) TLB-B12, Serum-Total ONLY (04540-J81) TLB-Ferritin (82728-FER) TLB-Folic Acid (Folate) (82746-FOL) TLB-IBC Pnl (Iron/FE;Transferrin) (83550-IBC) TLB-Sedimentation Rate (ESR) (85652-ESR) TLB-PT (Protime) (85610-PTP)  Anticoagulation Management Assessment/Plan:            Patient Instructions: 1)  Please go to the basment for lab work. 2)  Begin Dexilant.  Samples are given. 3)  Begin Carafate at bedtime and as needed during the day. 4)  Begin Bentyl three times a day before meals. 5)  Please schedule a follow-up appointment in 2 weeks.  6)  The medication list was reviewed and reconciled.  All changed / newly prescribed medications were explained.  A complete medication list was provided to the patient / caregiver. 7)  Copy  sent to : Dr. Trinna Post Plotnikov 8)  Please continue current medications.  Prescriptions: BENTYL 10 MG  CAPS (DICYCLOMINE HCL) 1 po three times a day before meals  #90 x 1   Entered by:   Ashok Cordia RN   Authorized by:   Mardella Layman MD [facility]   Signed by:   Ashok Cordia RN on  11/21/2009   Method used:   Electronically to        [facility] [address] (retail)       [address]       [place], [place]  [address]       Ph: [phone] or [phone]       Fax: [phone]   RxID:   [id number] CARAFATE 1 GM/10ML  SUSP (SUCRALFATE) 1 gm q hs and as needed  #14 oz x 1   Entered by:   Ashok Cordia RN   Authorized by:   Mardella Layman MD [facility]   Signed by:   Ashok Cordia RN on 11/21/2009   Method used:   Electronically to        [facility] [address] (retail)       [address]       [place], [place]  [address]       Ph: [phone] or [phone]       Fax: [phone]   RxID:   [id number]

## 2010-06-26 NOTE — Progress Notes (Signed)
Summary: Stools are yellowish  Phone Note Call from Patient Call back at Home Phone [phone]   Call For: Dr Jarold Motto Reason for Call: Talk to Nurse Summary of Call: Her stools are yellowish/orange and she is a little worried. Her cancer Physician asked her to call us. Initial call taken by: Leanor Kail [facility],  March 26, 2010 4:12 PM  Follow-up for Phone Call        Patient has a few week hx of "yellowish/orange" stool .  She had some loose stools then.  No longer loose stool, but stools are slightly discolored this "yellowish/orange color" .  Her LDH level was abnormal today at 252 and was asked by her Oncolgist Dr Cresenciano Genre at [facility] to call and let us know.  Dr Jarold Motto please advise if this would cause a problem.  Patient  has no other complaints Follow-up by: Darcey Nora RN, CGRN,  March 26, 2010 4:20 PM  Additional Follow-up for Phone Call Additional follow up Details #1::        negative colonoscopy in March of 2010. I see no need for further evaluation at this time. She is concerned she can make an office visit for followup. Additional Follow-up by: Mardella Layman MD Nani Skillern 12:15 PM    Additional Follow-up for Phone Call Additional follow up Details #2::    patient advised of Dr Norval Gable response Follow-up by: Darcey Nora RN, CGRN,  March 27, 2010 1:27 PM

## 2010-06-26 NOTE — Assessment & Plan Note (Signed)
Summary: DR AVP PT-NO PM CLINIC--MOUTH & HAND SWELL'G-FACE ARM RASH-BR...   Vital Signs:  Patient profile:   65 year old female Height:      69 inches Weight:      192.25 pounds BMI:     28.49 O2 Sat:      97 % on Room air Temp:     98.4 degrees F oral Pulse rate:   72 / minute Pulse rhythm:   regular Resp:     16 per minute BP sitting:   126 / 80  (left arm) Cuff size:   large  Vitals Entered By: Rock Nephew CMA (January 11, 2010 2:11 PM)  Nutrition Counseling: Patient's BMI is greater than 25 and therefore counseled on weight management options.  O2 Flow:  Room air CC: Patient c/o itchy bilateral arm-face rash w/ swollen lip   Primary Care Berna Gitto:  Sonda Primes MD  CC:  Patient c/o itchy bilateral arm-face rash w/ swollen lip.  History of Present Illness: She c/o's a 2 day hx. of itchy rash on her face and forearms that developed one day after she had been spreading bales of pine straw.  Asthma History    Initial Asthma Severity Rating:    Age range: 12+ years    Symptoms: 0-2 days/week    Nighttime Awakenings: 0-2/month    Interferes w/ normal activity: no limitations    SABA use (not for EIB): 0-2 days/week    Asthma Severity Assessment: Intermittent   Preventive Screening-Counseling & Management  Alcohol-Tobacco     Alcohol drinks/day: 0     Smoking Status: never     Passive Smoke Exposure: no  Hep-HIV-STD-Contraception     Hepatitis Risk: no risk noted     HIV Risk: no     STD Risk: no risk noted  Safety-Violence-Falls     Seat Belt Use: yes     Helmet Use: yes     Firearms in the Home: no firearms in the home     Smoke Detectors: yes     Violence in the Home: no risk noted  Medications Prior to Update: 1)  Klor-Con M20 20 Meq  Tbcr (Potassium Chloride Crys Cr) .... Take 1 By Mouth Qd 2)  Hyzaar 100-12.5 Mg Tabs (Losartan Potassium-Hctz) .Marland Kitchen.. 1 By Mouth Once Daily 3)  Furosemide 40 Mg Tabs (Furosemide) .... Take One Tablet Daily 4)  Vitamin  B-12 Cr 1000 Mcg  Tbcr (Cyanocobalamin) .... Take One Tablet By Mouth Daily 5)  Vitamin D3 1000 Unit  Tabs (Cholecalciferol) .Marland Kitchen.. 1 By Mouth Daily 6)  Red Yeast Rice 600 Mg Tabs (Red Yeast Rice Extract) .... Two Times A Day 7)  Dexilant 60 Mg Cpdr (Dexlansoprazole) .Marland Kitchen.. 1 By Mouth Qd 8)  Carafate 1 Gm/57ml  Susp (Sucralfate) .Marland Kitchen.. 1 Gm Q Hs and As Needed 9)  Bentyl 10 Mg  Caps (Dicyclomine Hcl) .Marland Kitchen.. 1 Po Three Times A Day Before Meals  Current Medications (verified): 1)  Klor-Con M20 20 Meq  Tbcr (Potassium Chloride Crys Cr) .... Take 1 By Mouth Qd 2)  Hyzaar 100-12.5 Mg Tabs (Losartan Potassium-Hctz) .Marland Kitchen.. 1 By Mouth Once Daily 3)  Furosemide 40 Mg Tabs (Furosemide) .... Take One Tablet Daily 4)  Vitamin B-12 Cr 1000 Mcg  Tbcr (Cyanocobalamin) .... Take One Tablet By Mouth Daily 5)  Vitamin D3 1000 Unit  Tabs (Cholecalciferol) .Marland Kitchen.. 1 By Mouth Daily 6)  Red Yeast Rice 600 Mg Tabs (Red Yeast Rice Extract) .... Two Times A Day 7)  Dexilant 60 Mg Cpdr (Dexlansoprazole) .Marland Kitchen.. 1 By Mouth Qd 8)  Carafate 1 Gm/40ml  Susp (Sucralfate) .Marland Kitchen.. 1 Gm Q Hs and As Needed 9)  Bentyl 10 Mg  Caps (Dicyclomine Hcl) .Marland Kitchen.. 1 Po Three Times A Day Before Meals 10)  Epipen 2-Pak 0.3 Mg/0.41ml Devi (Epinephrine) .... Use As Directed 11)  Clobetasol Propionate 0.05 % Crea (Clobetasol Propionate) .... Apply To Aa Two Times A Day For 7 Days 12)  Hydroxyzine Hcl 25 Mg Tabs (Hydroxyzine Hcl) .... One By Mouth Qid As Needed For Itching  Allergies (verified): 1)  ! Codeine 2)  ! Sulfa 3)  ! Ace Inhibitors 4)  ! 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 Family History of Stroke F 1st degree relative at 34 Family History of Stroke M 1st degree relative   Social History: Reviewed history from 02/28/2009 and no changes required. Occupation: Radiation protection practitioner, 2 children Never Smoked Alcohol use-no Drug use-no Regular exercise-yes Divorced Hepatitis Risk:  no risk noted STD Risk:  no risk noted Seat Belt Use:  yes  Review of Systems Resp:  Denies chest pain with inspiration, cough, coughing up blood, pleuritic, shortness of breath, sputum productive, and wheezing. Derm:  Complains of itching, lesion(s), and rash; denies changes in color of skin, changes in nail beds, dryness, excessive perspiration, flushing, hair loss, and poor wound healing.  Physical Exam  General:  Well developed, well nourished, no acute distress.healthy appearing.   Head:  Normocephalic and atraumatic. Mouth:  Oral mucosa and oropharynx without lesions or exudates.  Teeth in good repair. Neck:  Supple; no masses or thyromegaly. Lungs:  normal respiratory effort, no intercostal retractions, no accessory muscle use, normal breath sounds, no dullness, no fremitus, no crackles, and no wheezes.   Heart:  Regular rate and rhythm; no murmurs, rubs,  or bruits. Abdomen:  soft, non-tender, normal bowel sounds, no distention, no masses, no guarding, no rigidity,  no rebound tenderness, no abdominal hernia, no inguinal hernia, no hepatomegaly, and no splenomegaly.   Msk:  normal ROM, no joint tenderness, no joint swelling, no joint warmth, no redness over joints, no joint deformities, no joint instability, and no crepitation.   Pulses:  R and L carotid,radial,femoral,dorsalis pedis and posterior tibial pulses are full and equal bilaterally Extremities:  No clubbing, cyanosis, edema, or deformity noted with normal full range of motion of all joints.   Neurologic:  No cranial nerve deficits noted. Station and gait are normal. Plantar reflexes are down-going bilaterally. DTRs are symmetrical throughout. Sensory, motor and coordinative functions appear intact. Skin:  she has faint erythema and swelling on the forearms that ends  abruptly where her gloves were located, she has mild erythema and swelling over her chin and lower face bilaterally. there are some linear papules that appear classic for contact dermatitis Cervical Nodes:  no anterior cervical adenopathy and no posterior cervical adenopathy.   Axillary Nodes:  no R axillary adenopathy and no L axillary adenopathy.   Inguinal Nodes:  no R inguinal adenopathy and no L inguinal adenopathy.   Psych:  Cognition and judgment appear intact. Alert and cooperative with normal attention span and concentration. No apparent delusions, illusions, hallucinations   Impression & Recommendations:  Problem # 1:  CONTACT DERMATITIS&OTHER ECZEMA DUE TO PLANTS (ICD-692.6) Assessment New  Her updated medication list for this problem includes:    Clobetasol Propionate 0.05 % Crea (Clobetasol propionate) .Marland Kitchen... Apply to aa two times a day for 7 days  Orders: Depo- Medrol 40mg  (J1030) Depo- Medrol 80mg  (J1040) Admin of Therapeutic Inj  intramuscular or subcutaneous (45409)  Complete Medication List: 1)  Klor-con M20 20 Meq Tbcr (Potassium chloride crys cr) .... Take 1 by mouth qd 2)  Hyzaar 100-12.5 Mg Tabs (Losartan  potassium-hctz) .Marland Kitchen.. 1 by mouth once daily 3)  Furosemide 40 Mg Tabs (Furosemide) .... Take one tablet daily 4)  Vitamin B-12 Cr 1000 Mcg Tbcr (Cyanocobalamin) .... Take one tablet by mouth daily 5)  Vitamin D3 1000 Unit Tabs (Cholecalciferol) .Marland Kitchen.. 1 by mouth daily 6)  Red Yeast Rice 600 Mg Tabs (Red yeast rice extract) .... Two times a day 7)  Dexilant 60 Mg Cpdr (Dexlansoprazole) .Marland Kitchen.. 1 by mouth qd 8)  Carafate 1 Gm/67ml Susp (Sucralfate) .Marland Kitchen.. 1 gm q hs and as needed 9)  Bentyl 10 Mg Caps (Dicyclomine hcl) .Marland Kitchen.. 1 po three times a day before meals 10)  Epipen 2-pak 0.3 Mg/0.63ml Devi (Epinephrine) .... Use as directed 11)  Clobetasol Propionate 0.05 % Crea (Clobetasol propionate) .... Apply to aa two times a day for 7 days 12)  Hydroxyzine Hcl 25 Mg Tabs (Hydroxyzine hcl) .... One by mouth qid as needed for itching  Patient Instructions: 1)  Please schedule a follow-up appointment in 2 weeks. Prescriptions: HYDROXYZINE HCL 25 MG TABS (HYDROXYZINE HCL) One by mouth QID as needed for itching  #35 x 1   Entered and Authorized by:   Etta Grandchild MD   Signed by:   Etta Grandchild MD on 01/11/2010   Method used:   Electronically to        [facility] [address] (retail)       [address]       [place], [place]  [address]       Ph: [phone] or [phone]       Fax: [phone]   RxID:   [id number] CLOBETASOL PROPIONATE 0.05 % CREA (CLOBETASOL PROPIONATE) Apply to AA two times a day for 7 days  #30 gms x 1   Entered and Authorized by:   Etta Grandchild MD   Signed by:   Etta Grandchild MD on 01/11/2010   Method used:   Electronically to        [facility] [address] (retail)       [address]       [place], [place]  [address]       Ph: [phone] or [phone]       Fax: [phone]   RxID:  [id number] EPIPEN 2-PAK 0.3 MG/0.3ML DEVI (EPINEPHRINE) Use as directed  #1 x 5   Entered and Authorized by:   Etta Grandchild MD   Signed by:   Etta Grandchild MD on 01/11/2010   Method used:   Electronically to        [facility] [address] (retail)       [address]       [place], [place]  [address]       Ph: [phone] or [phone]       Fax: [phone]   RxID:   [id number]    Medication Administration  Injection # 1:    Medication: Depo- Medrol 40mg     Diagnosis: CONTACT DERMATITIS&OTHER ECZEMA DUE TO PLANTS (ICD-692.6)    Route: IM    Site: LUOQ gluteus    Exp Date: 08/25/2012    Lot #: [id number]    Mfr: Pharmacia    Comments: pt rec 120mg     Patient tolerated injection without complications    Given by: Lanier Prude, CMA(AAMA) (January 11, 2010 2:41 PM)  Injection # 2:    Medication: Depo- Medrol 80mg     Diagnosis: CONTACT DERMATITIS&OTHER ECZEMA DUE TO PLANTS (ICD-692.6)    Comments: same as above  Orders Added: 1)  Depo- Medrol 40mg  [J1030] 2)  Depo- Medrol 80mg  [J1040] 3)  Admin of Therapeutic Inj  intramuscular or subcutaneous [96372] 4)  Est. Patient Level IV [95638]

## 2010-06-28 NOTE — Progress Notes (Signed)
Summary: REQ FOR RX?  Phone Note Call from Patient   Summary of Call: Pt's boyfriend has recenlty left her. She is having trouble dealing w/this and wants to know if MD would call in rx to help. Or does pt need office visit?   Pt wants med to help that is not habit forming & she only wants it for a short time.   Initial call taken by: Lamar Sprinkles, CMA,  May 17, 2010 1:58 PM  Follow-up for Phone Call        Take Lorazepam as needed. OV next week Follow-up by: Tresa Garter MD,  May 18, 2010 1:09 PM    New/Updated Medications: LORAZEPAM 0.5 MG TABS (LORAZEPAM) 1 by mouth two times a day as needed anxiety Prescriptions: LORAZEPAM 0.5 MG TABS (LORAZEPAM) 1 by mouth two times a day as needed anxiety  #15 x 0   Entered and Authorized by:   Margaret Pyle, CMA   Signed by:   Margaret Pyle, CMA on 05/18/2010   Method used:   Telephoned to ...       [facility] [address] (retail)       [address]       [place], [place]  [address]       Ph: [phone] or [phone]       Fax: [phone]   RxID:   [id number]

## 2010-06-28 NOTE — Assessment & Plan Note (Signed)
Summary: ROA/ANXIETY/JSS   Vital Signs:  Patient profile:   65 year old female Height:      69 inches Weight:      193 pounds BMI:     28.60 Temp:     98.7 degrees F oral Pulse rate:   84 / minute Pulse rhythm:   regular Resp:     16 per minute BP sitting:   148 / 82  (left arm) Cuff size:   large  Vitals Entered By: Lanier Prude, CMA(AAMA) (May 22, 2010 7:56 AM) CC: f/u  Comments pt is not taking klor con, Hyzaar, Furosemide, Dexilant, Carafate, bentyl, Epipen, Clobetasol or  Hydroxyzine   Primary Care Provider:  Sonda Primes MD  CC:  f/u .  History of Present Illness: C/o depressed mood worse over holidays - boyfriend of 15 years left her. F/u HTN, anxiety, FMS.  Current Medications (verified): 1)  Klor-Con M20 20 Meq  Tbcr (Potassium Chloride Crys Cr) .... Take 1 By Mouth Qd 2)  Hyzaar 100-12.5 Mg Tabs (Losartan Potassium-Hctz) .Marland Kitchen.. 1 By Mouth Once Daily 3)  Furosemide 40 Mg Tabs (Furosemide) .... Take One Tablet Daily 4)  Vitamin B-12 Cr 1000 Mcg  Tbcr (Cyanocobalamin) .... Take One Tablet By Mouth Daily 5)  Vitamin D3 1000 Unit  Tabs (Cholecalciferol) .Marland Kitchen.. 1 By Mouth Daily 6)  Red Yeast Rice 600 Mg Tabs (Red Yeast Rice Extract) .... Two Times A Day 7)  Dexilant 60 Mg Cpdr (Dexlansoprazole) .Marland Kitchen.. 1 By Mouth Qd 8)  Carafate 1 Gm/33ml  Susp (Sucralfate) .Marland Kitchen.. 1 Gm Q Hs and As Needed 9)  Bentyl 10 Mg  Caps (Dicyclomine Hcl) .Marland Kitchen.. 1 Po Three Times A Day Before Meals 10)  Epipen 2-Pak 0.3 Mg/0.38ml Devi (Epinephrine) .... Use As Directed 11)  Clobetasol Propionate 0.05 % Crea (Clobetasol Propionate) .... Apply To Aa Two Times A Day For 7 Days 12)  Hydroxyzine Hcl 25 Mg Tabs (Hydroxyzine Hcl) .... One By Mouth Qid As Needed For Itching 13)  Lorazepam 0.5 Mg Tabs (Lorazepam) .Marland Kitchen.. 1 By Mouth Two Times A Day As Needed Anxiety 14)  Tylenol Extra Strength 500 Mg Tabs (Acetaminophen) .... As Needed  Allergies (verified): 1)  ! Codeine 2)  ! Sulfa 3)  ! Ace Inhibitors 4)   ! 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  The patient denies anorexia, chest pain, and dyspnea on exertion.    Physical Exam  General:  Well developed, well nourished, no acute distress.healthy appearing.   Eyes:  No corneal or conjunctival inflammation noted. EOMI. Perrla. Nose:  External nasal examination shows no deformity or inflammation. Nasal mucosa are pink and moist without lesions or exudates. Mouth:  Oral mucosa and oropharynx without lesions or exudates.  Teeth in good repair. Lungs:  normal respiratory effort, no intercostal retractions, no accessory muscle use, normal breath sounds, no dullness, no fremitus, no crackles,  and no wheezes.   Heart:  Regular rate and rhythm; no murmurs, rubs,  or bruits. Abdomen:  soft, non-tender, normal bowel sounds, no distention, no masses, no guarding, no rigidity, no rebound tenderness, no abdominal hernia, no inguinal hernia, no hepatomegaly, and no splenomegaly.   Msk:  normal ROM, no joint tenderness, no joint swelling, no joint warmth, no redness over joints, no joint deformities, no joint instability, and no crepitation.   Extremities:  No clubbing, cyanosis, edema, or deformity noted with normal full range of motion of all joints.   Neurologic:  No cranial nerve deficits noted. Station and gait  are normal. Plantar reflexes are down-going bilaterally. DTRs are symmetrical throughout. Sensory, motor and coordinative functions appear intact. Skin:  R forearm scar Psych:  Oriented X3, not suicidal, not homicidal, depressed affect, and tearful.     Impression & Recommendations:  Problem # 1:  DEPRESSION (ICD-311) situational Assessment New  Start Prozac - discussed Declined psychol consult  Problem # 2:  VITAMIN B12 DEFICIENCY (ICD-266.2) Assessment: Comment Only  Orders: T-Vitamin D (25-Hydroxy) ([address]) TLB-B12, Serum-Total ONLY (09811-B14) TLB-BMP (Basic Metabolic Panel-BMET) (80048-METABOL) TLB-CBC Platelet - w/Differential (85025-CBCD) TLB-Hepatic/Liver Function Pnl (80076-HEPATIC) TLB-Lipid Panel (80061-LIPID) TLB-TSH (Thyroid Stimulating Hormone) (84443-TSH) TLB-Udip ONLY (81003-UDIP)  Problem # 3:  FIBROMYALGIA (ICD-729.1) Assessment: Improved  Her updated medication list for this problem includes:    Tylenol Extra Strength 500 Mg Tabs (Acetaminophen) .Marland Kitchen... As needed  Problem # 4:  VITAMIN D DEFICIENCY (ICD-268.9) Assessment: Comment Only  Orders: T-Vitamin D (25-Hydroxy) ([address]-62130) TLB-B12, Serum-Total ONLY (86578-I69) TLB-BMP (Basic Metabolic Panel-BMET) (80048-METABOL) TLB-CBC Platelet - w/Differential (85025-CBCD) TLB-Hepatic/Liver Function Pnl (80076-HEPATIC) TLB-Lipid Panel (80061-LIPID) TLB-TSH (Thyroid Stimulating Hormone) (84443-TSH) TLB-Udip ONLY (81003-UDIP)  Problem # 5:  GERD (ICD-530.81) Assessment: Improved  Her updated medication list for this problem includes:    Dexilant 60 Mg Cpdr (Dexlansoprazole) .Marland Kitchen... 1 by mouth qd    Carafate 1 Gm/61ml Susp (Sucralfate) .Marland Kitchen... 1 gm q hs and as needed    Bentyl 10 Mg Caps (Dicyclomine hcl) .Marland Kitchen... 1 po three times a day before meals  Problem # 6:  HYPERTENSION (ICD-401.9) Assessment: Deteriorated  Her updated medication list for this problem includes:    Hyzaar 100-12.5 Mg Tabs (Losartan  potassium-hctz) .Marland Kitchen... 1 by mouth once daily    Furosemide 40 Mg Tabs (Furosemide) .Marland Kitchen... Take one tablet daily  Orders: T-Vitamin D (25-Hydroxy) [phone]) TLB-B12, Serum-Total ONLY (44010-U72) TLB-BMP (Basic Metabolic Panel-BMET) (80048-METABOL) TLB-CBC Platelet - w/Differential (85025-CBCD) TLB-Hepatic/Liver Function Pnl (80076-HEPATIC) TLB-Lipid Panel (80061-LIPID) TLB-TSH (Thyroid Stimulating Hormone) (84443-TSH) TLB-Udip ONLY (81003-UDIP)  BP today: 148/82 Prior BP: 126/80 (01/11/2010)  Prior 10 Yr Risk Heart Disease: > 32 % (03/16/2008)  Labs Reviewed: K+: 3.9 (11/21/2009) Creat: : 0.8 (11/21/2009)   Chol: 194 (10/08/2007)   HDL: 33.8 (10/08/2007)   LDL: 145 (10/08/2007)   TG: 75 (10/08/2007)  Complete Medication List: 1)  Klor-con M20 20 Meq Tbcr (Potassium chloride crys cr) .... Take 1 by mouth qd 2)  Hyzaar 100-12.5 Mg Tabs (Losartan potassium-hctz) .Marland Kitchen.. 1 by mouth once daily 3)  Furosemide 40 Mg Tabs (Furosemide) .... Take one tablet daily 4)  Vitamin B-12 Cr 1000 Mcg Tbcr (Cyanocobalamin) .... Take one tablet by mouth daily 5)  Vitamin D3 1000 Unit Tabs (Cholecalciferol) .Marland Kitchen.. 1 by mouth daily 6)  Red Yeast Rice 600 Mg Tabs (Red yeast rice extract) .... Two times a day 7)  Dexilant 60 Mg Cpdr (Dexlansoprazole) .Marland Kitchen.. 1 by mouth qd 8)  Carafate 1 Gm/20ml Susp (Sucralfate) .Marland KitchenMarland KitchenMarland Kitchen  1 gm q hs and as needed 9)  Bentyl 10 Mg Caps (Dicyclomine hcl) .Marland Kitchen.. 1 po three times a day before meals 10)  Epipen 2-pak 0.3 Mg/0.38ml Devi (Epinephrine) .... Use as directed 11)  Clobetasol Propionate 0.05 % Crea (Clobetasol propionate) .... Apply to aa two times a day for 7 days 12)  Hydroxyzine Hcl 25 Mg Tabs (Hydroxyzine hcl) .... One by mouth qid as needed for itching 13)  Lorazepam 0.5 Mg Tabs (Lorazepam) .Marland Kitchen.. 1 by mouth two times a day as needed anxiety 14)  Tylenol Extra Strength 500 Mg Tabs (Acetaminophen) .... As needed 15)  Fluoxetine Hcl 10 Mg Tabs (Fluoxetine hcl) .Marland Kitchen.. 1 by mouth once  daily  Patient Instructions: 1)  Please schedule a follow-up appointment in 6 weeks. Prescriptions: FLUOXETINE HCL 10 MG TABS (FLUOXETINE HCL) 1 by mouth once daily  #30 x 6   Entered and Authorized by:   Tresa Garter MD   Signed by:   Tresa Garter MD on 05/22/2010   Method used:   Electronically to        [facility] [address] (retail)       [address]       [place], [place]  [address]       Ph: [phone] or [phone]       Fax: [phone]   RxID:   [id number]    Orders Added: 1)  Est. Patient Level IV [27253] 2)  T-Vitamin D (25-Hydroxy) [phone] 3)  TLB-B12, Serum-Total ONLY [82607-B12] 4)  TLB-BMP (Basic Metabolic Panel-BMET) [80048-METABOL] 5)  TLB-CBC Platelet - w/Differential [85025-CBCD] 6)  TLB-Hepatic/Liver Function Pnl [80076-HEPATIC] 7)  TLB-Lipid Panel [80061-LIPID] 8)  TLB-TSH (Thyroid Stimulating Hormone) [84443-TSH] 9)  TLB-Udip ONLY [81003-UDIP]

## 2010-07-03 ENCOUNTER — Ambulatory Visit: Payer: Self-pay | Admitting: Internal Medicine

## 2010-07-23 ENCOUNTER — Ambulatory Visit (INDEPENDENT_AMBULATORY_CARE_PROVIDER_SITE_OTHER): Payer: BC Managed Care – PPO | Admitting: Internal Medicine

## 2010-07-23 ENCOUNTER — Encounter: Payer: Self-pay | Admitting: Internal Medicine

## 2010-07-23 DIAGNOSIS — E538 Deficiency of other specified B group vitamins: Secondary | ICD-10-CM

## 2010-07-23 DIAGNOSIS — F329 Major depressive disorder, single episode, unspecified: Secondary | ICD-10-CM

## 2010-07-23 DIAGNOSIS — E559 Vitamin D deficiency, unspecified: Secondary | ICD-10-CM

## 2010-08-02 NOTE — Assessment & Plan Note (Signed)
Summary: 6 wk rov,#,cd   Vital Signs:  Patient profile:   65 year old female Height:      69 inches (175.26 cm) Weight:      192.50 pounds (87.50 kg) BMI:     28.53 O2 Sat:      98 % on Room air Temp:     98.3 degrees F (36.83 degrees C) oral Pulse rate:   65 / minute Resp:     16 per minute BP sitting:   148 / 82  (left arm) Cuff size:   regular  Vitals Entered By: Burnard Leigh CMA(AAMA) (July 23, 2010 9:11 AM)  O2 Flow:  Room air CC: 6-wk F/U to discuss Meds Is Patient Diabetic? No Comments Pt states that she is no longer taking: Klor-Con, Furosemide, Hyzaar, Dexilant, Carafate, Bentyl, Clobetasol Propionate, Hydroxyzine HCL, Lorazepam. Pt needs Rx for EpiPen renewal.   Primary Care Provider:  Sonda Primes MD  CC:  6-wk F/U to discuss Meds.  History of Present Illness: F/u depression - better F/u FMS, Vit D def and Vit B12 def  Current Medications (verified): 1)  Klor-Con M20 20 Meq  Tbcr (Potassium Chloride Crys Cr) .... Take 1 By Mouth Qd 2)  Hyzaar 100-12.5 Mg Tabs (Losartan Potassium-Hctz) .Marland Kitchen.. 1 By Mouth Once Daily 3)  Furosemide 40 Mg Tabs (Furosemide) .... Take One Tablet Daily 4)  Vitamin B-12 Cr 1000 Mcg  Tbcr (Cyanocobalamin) .... Take One Tablet By Mouth Daily 5)  Vitamin D3 1000 Unit  Tabs (Cholecalciferol) .Marland Kitchen.. 1 By Mouth Daily 6)  Red Yeast Rice 600 Mg Tabs (Red Yeast Rice Extract) .... Two Times A Day 7)  Dexilant 60 Mg Cpdr (Dexlansoprazole) .Marland Kitchen.. 1 By Mouth Qd 8)  Carafate 1 Gm/78ml  Susp (Sucralfate) .Marland Kitchen.. 1 Gm Q Hs and As Needed 9)  Bentyl 10 Mg  Caps (Dicyclomine Hcl) .Marland Kitchen.. 1 Po Three Times A Day Before Meals 10)  Epipen 2-Pak 0.3 Mg/0.26ml Devi (Epinephrine) .... Use As Directed 11)  Clobetasol Propionate 0.05 % Crea (Clobetasol Propionate) .... Apply To Aa Two Times A Day For 7 Days 12)  Hydroxyzine Hcl 25 Mg Tabs (Hydroxyzine Hcl) .... One By Mouth Qid As Needed For Itching 13)  Lorazepam 0.5 Mg Tabs (Lorazepam) .Marland Kitchen.. 1 By Mouth Two Times A Day  As Needed Anxiety 14)  Tylenol Extra Strength 500 Mg Tabs (Acetaminophen) .... As Needed 15)  Fluoxetine Hcl 10 Mg Tabs (Fluoxetine Hcl) .Marland Kitchen.. 1 By Mouth Once Daily  Allergies (verified): 1)  ! Codeine 2)  ! Sulfa 3)  ! Ace Inhibitors 4)  ! * Shellfish 5)  * Micardis  Past History:  Social History: Last updated: 02/28/2009 Occupation: Production designer, theatre/television/film Single, 2 children Never Smoked Alcohol use-no Drug use-no Regular exercise-yes Divorced  Past Medical History: Reviewed history from 05/22/2010 and no changes required. GERD Hypertension Breast cancer, hx of 1996 Vit D def FMS Vit B12 def Gyn   Dr [facility] Inflamatory Lung nodules Dyspnea: normal spirometry 11/09 EF 65% BNP normal 7/09 Depression situational 2011 R elbow melanoma 2009  Past Surgical History: Reviewed history from 05/22/2010 and no changes required. EGD (05/09/2003) Mastect R 1996, no lymph nodes, chemotherapy sx  to feet Tonsillectomy TMJ sx Nissen's surgery 05/10 Melanoma (R) arm 07/10  Review of Systems  The patient denies fever, weight gain, dyspnea on exertion, and abdominal pain.         better  Physical Exam  General:  Well developed, well nourished, no acute distress.healthy appearing.  Eyes:  No corneal or conjunctival inflammation noted. EOMI. Perrla. Mouth:  Oral mucosa and oropharynx without lesions or exudates.  Teeth in good repair. Lungs:  normal respiratory effort, no intercostal retractions, no accessory muscle use, normal breath sounds, no dullness, no fremitus, no crackles, and no wheezes.   Heart:  Regular rate and rhythm; no murmurs, rubs,  or bruits. Abdomen:  soft, non-tender, normal bowel sounds, no distention, no masses, no guarding, no rigidity, no rebound tenderness, no abdominal hernia, no inguinal hernia, no hepatomegaly, and no splenomegaly.   Msk:  normal ROM, no joint tenderness, no joint swelling, no joint warmth, no redness over joints, no joint deformities, no  joint instability, and no crepitation.   Neurologic:  No cranial nerve deficits noted. Station and gait are normal. Plantar reflexes are down-going bilaterally. DTRs are symmetrical throughout. Sensory, motor and coordinative functions appear intact. Skin:  R forearm scar Psych:  Oriented X3, not suicidal, not homicidal, no depressed affect, and not tearful.     Impression & Recommendations:  Problem # 1:  DEPRESSION (ICD-311) Assessment Improved  Her updated medication list for this problem includes:    Hydroxyzine Hcl 25 Mg Tabs (Hydroxyzine hcl) ..... One by mouth qid as needed for itching    Lorazepam 0.5 Mg Tabs (Lorazepam) .Marland Kitchen... 1 by mouth two times a day as needed anxiety    Fluoxetine Hcl 10 Mg Tabs (Fluoxetine hcl) .Marland Kitchen... 1 by mouth once daily  Problem # 2:  VITAMIN B12 DEFICIENCY (ICD-266.2) Assessment: Improved On the regimen of medicine(s) reflected in the chart    Problem # 3:  VITAMIN D DEFICIENCY (ICD-268.9) Assessment: Improved On the regimen of medicine(s) reflected in the chart    Problem # 4:  FIBROMYALGIA (ICD-729.1) Assessment: Improved  Her updated medication list for this problem includes:    Tylenol Extra Strength 500 Mg Tabs (Acetaminophen) .Marland Kitchen... As needed  Complete Medication List: 1)  Klor-con M20 20 Meq Tbcr (Potassium chloride crys cr) .... Take 1 by mouth qd 2)  Hyzaar 100-12.5 Mg Tabs (Losartan potassium-hctz) .Marland Kitchen.. 1 by mouth once daily 3)  Furosemide 40 Mg Tabs (Furosemide) .... Take one tablet daily 4)  Vitamin B-12 Cr 1000 Mcg Tbcr (Cyanocobalamin) .... Take one tablet by mouth daily 5)  Vitamin D3 1000 Unit Tabs (Cholecalciferol) .Marland Kitchen.. 1 by mouth daily 6)  Red Yeast Rice 600 Mg Tabs (Red yeast rice extract) .... Two times a day 7)  Dexilant 60 Mg Cpdr (Dexlansoprazole) .Marland Kitchen.. 1 by mouth qd 8)  Carafate 1 Gm/75ml Susp (Sucralfate) .Marland Kitchen.. 1 gm q hs and as needed 9)  Bentyl 10 Mg Caps (Dicyclomine hcl) .Marland Kitchen.. 1 po three times a day before meals 10)   Epipen 2-pak 0.3 Mg/0.3ml Devi (Epinephrine) .... Use as directed 11)  Clobetasol Propionate 0.05 % Crea (Clobetasol propionate) .... Apply to aa two times a day for 7 days 12)  Hydroxyzine Hcl 25 Mg Tabs (Hydroxyzine hcl) .... One by mouth qid as needed for itching 13)  Lorazepam 0.5 Mg Tabs (Lorazepam) .Marland Kitchen.. 1 by mouth two times a day as needed anxiety 14)  Tylenol Extra Strength 500 Mg Tabs (Acetaminophen) .... As needed 15)  Fluoxetine Hcl 10 Mg Tabs (Fluoxetine hcl) .Marland Kitchen.. 1 by mouth once daily  Patient Instructions: 1)  Please schedule a follow-up appointment in 4 months.   Orders Added: 1)  Est. Patient Level IV [16109]

## 2010-08-30 LAB — POCT CARDIAC MARKERS
CKMB, poc: 1.8 ng/mL (ref 1.0–8.0)
Myoglobin, poc: 64.8 ng/mL (ref 12–200)

## 2010-08-30 LAB — DIFFERENTIAL
Lymphocytes Relative: 26 % (ref 12–46)
Lymphs Abs: 1.5 10*3/uL (ref 0.7–4.0)
Monocytes Absolute: 0.3 10*3/uL (ref 0.1–1.0)
Monocytes Relative: 6 % (ref 3–12)
Neutro Abs: 3.7 10*3/uL (ref 1.7–7.7)
Neutrophils Relative %: 65 % (ref 43–77)

## 2010-08-30 LAB — URINE MICROSCOPIC-ADD ON

## 2010-08-30 LAB — COMPREHENSIVE METABOLIC PANEL
ALT: 21 U/L (ref 0–35)
AST: 29 U/L (ref 0–37)
Albumin: 4 g/dL (ref 3.5–5.2)
Calcium: 9.4 mg/dL (ref 8.4–10.5)
GFR calc Af Amer: >60 mL/min (ref >60)
Glucose, Bld: 141 mg/dL — ABNORMAL HIGH (ref 70–99)
Sodium: 146 mEq/L — ABNORMAL HIGH (ref 135–145)
Total Protein: 7 g/dL (ref 6.0–8.3)

## 2010-08-30 LAB — URINALYSIS, ROUTINE W REFLEX MICROSCOPIC
Glucose, UA: NEGATIVE mg/dL
Ketones, ur: NEGATIVE mg/dL
Protein, ur: NEGATIVE mg/dL
Urobilinogen, UA: 0.2 mg/dL (ref 0.0–1.0)

## 2010-08-30 LAB — CBC
MCHC: 35.9 g/dL (ref 30.0–36.0)
Platelets: 166 10*3/uL (ref 150–400)
RDW: 12.9 % (ref 11.5–15.5)

## 2010-09-04 LAB — CBC
HCT: 34.5 % — ABNORMAL LOW (ref 36.0–46.0)
MCHC: 34.3 g/dL (ref 30.0–36.0)
MCV: 93.9 fL (ref 78.0–100.0)
Platelets: 129 10*3/uL — ABNORMAL LOW (ref 150–400)
RDW: 12.8 % (ref 11.5–15.5)

## 2010-09-04 LAB — HEMOGLOBIN AND HEMATOCRIT, BLOOD: HCT: 35.3 % — ABNORMAL LOW (ref 36.0–46.0)

## 2010-09-04 LAB — DIFFERENTIAL
Basophils Absolute: 0 10*3/uL (ref 0.0–0.1)
Basophils Relative: 1 % (ref 0–1)
Eosinophils Absolute: 0 10*3/uL (ref 0.0–0.7)
Eosinophils Relative: 0 % (ref 0–5)

## 2010-09-04 LAB — BASIC METABOLIC PANEL
BUN: 19 mg/dL (ref 6–23)
Chloride: 108 mEq/L (ref 96–112)
Glucose, Bld: 102 mg/dL — ABNORMAL HIGH (ref 70–99)
Potassium: 3.8 mEq/L (ref 3.5–5.1)
Sodium: 142 mEq/L (ref 135–145)

## 2010-10-09 NOTE — Op Note (Signed)
[name], [name]               ACCOUNT NO.:  [id number]   MEDICAL RECORD NO.:  [id number]          PATIENT TYPE:  OIB   LOCATION:  0098                         FACILITY:  [facility]   PHYSICIAN:  Thornton Park. Daphine Deutscher, MD  DATE OF BIRTH:  1945/07/20   DATE OF PROCEDURE:  10/07/2008  DATE OF DISCHARGE:                               OPERATIVE REPORT   PREOPERATIVE DIAGNOSES:  1. Gastroesophageal reflux.  2. Hiatal hernia.  3. GR.   POSTOPERATIVE DIAGNOSES:  1. Gastroesophageal reflux.  2. Hiatal hernia.  3. GR.   PROCEDURES:  Laparoscopic repair of hiatal hernia, with pledgeted suture  posteriorly and anteriorly; with Nissen fundoplication over a #50  lighted bougie, held in place with 3 sutures.   FINDINGS:  1. Prominent dimple with hiatal hernia.  2. Previous TRAM flap avoided by concentrating trocar sites in the      left upper quadrant.   ASSISTANT:  Sharlet Salina T. Hoxworth, M.D.   ANESTHESIA:  General endotracheal.   DESCRIPTION OF PROCEDURE:  This 65 year old white female was taken to  Room 11 on Friday, Oct 07, 2008, and given general anesthesia.  The  abdomen was prepped with a Techni-Care equivalent and draped sterilely.  I entered the abdomen through the left upper quadrant, using a 0 degree  OptiVu without difficulty and insufflated.  Because of her previous  TRAM, we kind of transilluminated the anterior abdominal wall.  Her TRAM  on the right side was avoided by staying in the left upper quadrant.  I  therefore made a 5 mm trocar in the upper mid on the right, and an 11  more toward the midline on the right; and then another 10/11 in the left  lower abdomen for the camera port.   We took down the EG junction attachments and reduced the hernia, using  the harmonic scalpel.  I got good esophageal length by getting around  with a Penrose drain, holding traction and freeing it.  Once that was  done, I did a pledgeted suture closure posteriorly; and just because of  the  way it was lying, I went ahead and placed one anteriorly.  Anteriorly there was good hiatal crura to sew to, and pledgeted the  closure there as well.   That having been done, I freed up plenty of stomach and vaginated the  distal esophagus around the 50 dilator; suturing all 3 and attaching to  the esophagus, tying them, and holding them into place with tie knots.  I also used tie knots on the crural closure.   I put some Tisseel over the whole area.  It looked good.  No bleeding  was noted.  I deflated and injected the wounds with some Marcaine, and  closed with 4-0 Vicryl, Benzoin and Steri-Strips.  The patient tolerated  the procedure well; was taken to recovery room in satisfactory  condition.      Thornton Park Daphine Deutscher, MD  Electronically Signed     MBM/MEDQ  D:  10/07/2008  T:  10/07/2008  Job:  [id number]   cc:   [name]. Jarold Motto, MD, Caleen Essex,  FAGA  [address]  [place]  [place] [address]   Georgina Quint. Plotnikov, MD  [address]  [place]  [place] [address]

## 2010-10-09 NOTE — Assessment & Plan Note (Signed)
[name]                            CARDIOLOGY OFFICE NOTE   NAME:[name], [name] HITCH                      MRN:          [id number]  DATE:11/16/2007                            DOB:          [birth year]    A 65 year old patient referred for question heart failure and shortness  of breath.   The patient has been seen by Dr. Posey Rea about 6 weeks ago.  She was  treated with antibiotics.  She appeared to have URI with a cough and  congestion.  It did not improve.  She saw Dr. Andi Devon today at [facility] and was given a Z-Pak.  He was concerned since she had a cough,  shortness of breath.  Her symptoms seemed to be worse with recumbency.  There was a question of heart failure.  She apparently had a chest x-ray  there, which she told me was read as normal.  I do not have the results.  She said she only blew 60% on what sounds like a bedside FEV1.   In talking to the patient, there has been no previous history of  coronary artery disease.  She has been on high blood pressure medicines  for about 2 years.  She was also started on Lasix about 2 years ago for  lower extremity edema.  She saw Dr. Alanda Amass more than 10 years ago for  atypical chest pain and apparently had a negative workup.   She also apparently saw Dr. Sherene Sires 2-3 years ago for shortness of breath  and was started on a H2 blocker and Reglan with improvement in her  symptoms.   In talking to the patient, she has not had any significant fever.  She  has had a cough, it is fairly nonproductive.  She does describe her  shortness of breath being worse with recumbency.  She has not had any  significant weight gain and lower extremity edema.  She has also had  some atypical chest pain, it has been right-sided; it is not necessarily  exertional.  She feels tingling.  It can radiate up to the right side of  her head and she gets occasional diaphoresis.   REVIEW OF SYSTEMS:  Otherwise, negative.   PAST MEDICAL HISTORY:  Remarkable for breast cancer with right-sided  myectomy and plastic surgery flap.  She did have chemotherapy, but has  been cured since 1996.  She has had previous surgery of her feet,  tonsillectomy, and TMJ surgery.   ALLERGIES:  1. SHELLFISH.  2. CODEINE.   MEDICATIONS:  1. Amlodipine 10 mg a day.  2. Lasix 20 a day.  3. Klor-Con 10 a day.  4. B12.  5. Vitamin D.  6. Calcium.  7. She has also just started Z-Pak.   SOCIAL HISTORY:  The patient is divorced for over 20 years.  Her  boyfriend was with her.  She has 2 older children.  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   She is a nonsmoker.  She does not drink.  She is otherwise fairly  active.   FAMILY HISTORY:  Noncontributory.  Mother died of her stroke at 57, and  father died at 63 of old age.   PHYSICAL EXAMINATION:  Remarkable for a middle-aged white female in no  distress.  She is coughing, sounds more bronchitic.  Weight is 196, blood pressure 130/79, pulse 80 and regular, respiratory  rate 14, afebrile.  She appears to have a rub in the left side of her lungs.  Diaphragmatic  motion is good.  Carotids are normal without bruit.  No lymphadenopathy, thyromegaly, or  JVP elevation.  There is S1, S2.  Normal heart sounds.  PMI normal.  She is status post right mastectomy with surgical flap and plastic  surgery correction.  No lymphadenopathy.  ABDOMEN:  Benign.  Bowel sounds positive.  No AAA.  No tenderness.  No  bruit.  No hepatosplenomegaly or hepatojugular reflux.  EXTREMITIES:  Distal pulses are intact.  No edema.  NEURO:  Nonfocal.  SKIN:  Warm and dry.  No muscular weakness.   Her EKG shows sinus rhythm, nonspecific ST-T wave changes.   IMPRESSION:  1. Shortness of breath and cough.  I doubt that this is related to her      heart.  However, she does appear to have some orthopnea.  Check 2-D      echocardiogram.  Assess right ventricular and left ventricular       function.  2. Dyspnea with cough, question left-sided pleural rub.  Follow up      high-resolution CT scan and then followup appointment with Dr.      Sherene Sires.  Consider reinstitution of a proton pump inhibitor and      Reglan.  3. Hypertension, currently well controlled.  Continue current dose of      amlodipine.  4. Lower extremity edema, appears resolved.  Continue Lasix 20 a day.  5. Abnormal EKG, likely due to blood pressure, nonspecific ST-T wave      changes without ischemic disease.  However, the patient is having      atypical chest pain.  We will do a followup to review the mean echo      at the time of her resting echo.   Further recommendations will be based on the result of her tests.  However, I suspect that her issue is primarily pulmonary, and we will  make arrangements for her to have a high-resolution CT scan and to  follow up with Dr. Posey Rea and Sherene Sires.     Noralyn Pick. Eden Emms, MD, [facility]  Electronically Signed    PCN/MedQ  DD: 11/16/2007  DT: 11/17/2007  Job #: [id number]   cc:   Georgina Quint. Plotnikov, MD  Gabriel Earing, M.D.  Charlaine Dalton. Sherene Sires, MD, FCCP

## 2010-10-12 NOTE — Procedures (Signed)
[place]. [facility]  Patient:    [name], [name] Visit Number: [id number] MRN: [id number]          Service Type: END Location: ENDO Attending Physician:  Mardella Layman Dictated by:   [name]. Jarold Motto, M.D. [facility] Proc. Date: 02/16/01 Admit Date:  02/16/2001 Discharge Date: 02/16/2001                             Procedure Report  PROCEDURE:  Twenty-four hour pH probe.  RESULTS:  [name] is a 65 year old white female with atypical chest pain. She underwent a 24-hour pH probe test that was completed on February 16, 2001.  The test was entirely normal without any evidence of acid reflux in any position in the proximal or distal pH probe.  Total DeMeester score is 1.8. Total time the pH was less than 4 was normal in all positions, and there were no reflux episodes longer than five minutes in duration.  ASSESSMENT:  This is an entirely normal 24-hour pH probe study without any evidence of acid reflux disease.  There is no correlation of the patients coughing, belching, or other symptomatology with acid reflux.  RECOMMENDATIONS:  I do not think that the patient has acid reflux disease, and probably has atypical chest pain associated with afferent pain sensation. I will review her medical record and probably consider placing this patient on an antidepressant medication for afferent pain modification.  I see no need to continue treatment for acid reflux disease with these results. Dictated by:   [name]. Jarold Motto, M.D. [facility] Attending Physician:  Mardella Layman DD:  02/20/01 TD:  02/20/01 Job: [id number]

## 2010-10-12 NOTE — Op Note (Signed)
NAME:  [name], [name]               ACCOUNT NO.:  [id number]   MEDICAL RECORD NO.:  [id number]          PATIENT TYPE:  AMB   LOCATION:  DSC                          FACILITY:  [facility]   PHYSICIAN:  Marlowe Kays, M.D.  DATE OF BIRTH:  1946/02/20   DATE OF PROCEDURE:  01/17/2006  DATE OF DISCHARGE:                                 OPERATIVE REPORT   PREOPERATIVE DIAGNOSIS:  Nail gun injury to dorsum of proximal  interphalageal joint right index finger with persistent swelling and pain,  rule out foreign body inclusion cyst.   POSTOPERATIVE DIAGNOSIS:  Nail gun injury to dorsum of proximal  interphalageal joint right index finger with persistent swelling and pain,  rule out foreign body inclusion cyst.   OPERATION:  Exploration of the dorsum of the PIP joint in her right index  finger with coring out of the skin lesion, debridement of the underlying  tissues and excision of the central defect in the extensor tendon with  repair of tendon.   SURGEON:  Dr. Marlowe Kays.   ASSISTANT:  Nurse.   ANESTHESIA:  IV regional.   PATHOLOGY AND JUSTIFICATION FOR PROCEDURE:  She sustained a nail gun injury  on Oct 22, 2005 or almost 3 months ago.  She has had a persistent firm  nodule in the dorsum of the PIP joint with some pain.  When seen in my  office, she had no obvious evidence for infection and x-ray demonstrated no  foreign body. Because of the persistent problem, she is here today for  surgical correction however.   DESCRIPTION OF PROCEDURE:  Satisfactory IV regional anesthesia, DuraPrep  from mid forearm to fingertips and was draped in a sterile field.  I made a  ulnar based curved incision around the lesion at the dorsum of the PIP joint  and carried it down through the skin down to the underlying subcutaneous  tissue and extensor mechanism. The thickened area of the skin, I  meticulously pared down partly with magnifying loupes and thinned out most  of the area, sending the  tissue debrided out to pathology. I did core out  however the plug of tissue and sent that to pathology as well.  She had a  few mm hole in the central portion of the extensor tendon which I opened  with a longitudinal incision and coring out of the defect sending this  material to pathology as well. I then irrigated the wound well and again  checking it with loops found no foreign body and there was no gross evidence  for any infection.  I repaired the tendon with interrupted 4-0 Vicryl and  the skin with interrupted 4-0 nylon.  I blocked the finger with a 0.25%  plain Marcaine. Betadine Adaptic dry sterile dressing and a volar finger  splint were applied.  The tourniquet was released.  She tolerated the  procedure well and was taken to the recovery room in satisfactory condition  with no known complications.          ______________________________  Marlowe Kays, M.D.    JA/MEDQ  D:  01/17/2006  T:  01/18/2006  Job:  [id number]

## 2010-10-12 NOTE — Discharge Summary (Signed)
[facility]  Patient:    [name], [name] Visit Number: [id number] MRN: [id number]          Service Type: MED Location: 3W [phone] Attending Physician:  Corwin Levins Dictated by:   Rosalyn Gess. Norins, M.D. [facility] Admit Date:  01/26/2001 Discharge Date: 01/29/2001   CC:         Richard A. Alanda Amass, M.D.  Sonda Primes, M.D. [facility]  [name]. Jarold Motto, M.D. [facility]   Discharge Summary  ADMISSION DIAGNOSES: 1. Near syncope. 2. Abdominal/chest pain. 3. Hypertension. 4. History of gastroesophageal reflux disease. 5. History of breast cancer.  DISCHARGE DIAGNOSES: 1. Near syncope. 2. Abdominal/chest pain. 3. Hypertension. 4. History of gastroesophageal reflux disease. 5. History of breast cancer.  CONSULTATIONS:  Dr. Lina Sar for GI.  PROCEDURES: 1. The patient had an upper endoscopy which revealed the patient to have a    normal upper endoscopy.  She had normal cardia. 2. The patient had chest and abdominal x-rays on admission on 01/26/01, which    revealed a 3 mm calcification that might possibly be in the mid right    ureter.  Chest was unremarkable. 3. The patient had a CT of abdomen and pelvis with contrast on 01/27/01, which    revealed no abdominal findings, and no pelvic findings. 4. The patient had a CT examination of the chest with contrast, which    preliminary report indicates was negative for any significant findings    except for three small nodular densities that were thought to be    insignificant. 5. Abdominal ultrasound was negative for any gallbladder disease.  HISTORY OF PRESENT ILLNESS:  The patient is a 65 year old woman with 2 to 3 weeks of fatigue, hot flashes, and cold sweats, with one episode of left arm and neck tingling and discomfort.  The patient also has had some abdominal cramping and discomfort.  She awoke at 4 a.m. on the day of admission, and had a difficult bowel movement, and had a light-headed feeling and then  a near syncopal episode.  She was found by her family member to be clammy and pale. She did have emesis of a small amount of mucus.  She felt her stomach was "on fire."  Because of her multiple symptoms, she presented to the [facility].  At that point, it was felt prudent to admit the patient for her symptoms.  PAST MEDICAL HISTORY:  Well documented in the patients admission H&P.  ADMISSION MEDICATIONS: 1. Protonix 40 mg q.d. 2. Klonodine 0.1 TTS patch weekly. 3. Multivitamins, including black cohage.  PHYSICAL EXAMINATION:  VITAL SIGNS:  Blood pressure 157/89, temperature 96.5.  ABDOMEN:  Mild diffuse abdominal tenderness, but no other abnormalities noted.  LABORATORY DATA:  EKG showed a sinus rhythm, question of an old septal infarct which was unchanged.  Chest x-ray was negative.  HOSPITAL COURSE:  The patient was admitted to a telemetry floor.  #1 - CARDIOVASCULAR:  The patient had cardiac enzymes, CK, and troponin-I that were negative.  She had no significant abnormalities on telemetry with no arrhythmias.  It was felt that she had non-cardiac related symptoms.  #2 - GASTROINTESTINAL:  The patient has a history of gastroesophageal reflux disease.  Her last esophagogastroduodenoscopy was approximately one year ago. With her significant symptoms, she was seen in consultation by Dr. Lina Sar.  The patient was taken for upper endoscopy with results as noted.  With the patient having a normal esophagogastroduodenoscopy with unremarkable abdominal  ultrasound and negative chest CT and other studies, it was felt that although she had some mild residual discomfort, she was stable and ready for discharge home.  The patients other medical problems remained stable.  DISCHARGE PHYSICAL EXAMINATION:  GENERAL:  The patient is awake, alert, sitting in bed in no acute distress with no significant complaints.  This is a well-nourished woman.  VITAL  SIGNS:  Temperature was 97, blood pressure was 114/62, heart rate was 62, respiratory rate was 16.  CHEST:  The patient had no CVA tenderness.  LUNGS:  Clear.  CARDIOVASCULAR:  2+ radial pulse.  She had a regular rate and rhythm without murmurs.  ABDOMEN:  Soft.  She had no guarding, no rebound, no specific tenderness to palpation.  EXTREMITIES:  Without edema or deformity.  FINAL LABORATORY DATA:  The patient had a CBC on admission which was unremarkable with a white blood cell count of 7700, hemoglobin of 13.9. Prothrombin time was normal with an INR of 0.8.  Chemistries were unremarkable with a sodium of 141, potassium of 3.4, chloride of 110, CO2 of 27, glucose of 104, BUN of 16, creatinine of 0.8.  Liver function tests were normal.  CK was normal at 50, 48, and 42.  Troponin-I was negative x 2 at 0.02 and 0.01. Urinalysis was unremarkable with a urine culture being no growth at the time of discharge.  Radiographic studies as noted.  DISCHARGE MEDICATIONS:  The patient is to continue on her home medications with the change of increasing her Protonix to 40 mg b.i.d.  DISPOSITION:  The patient is discharged home.  She is to keep her already scheduled appointment with Dr. Sheryn Bison for February 10, 2001.  She should follow up with her other physicians as needed, including Dr. Alanda Amass and Dr. Posey Rea.  CONDITION ON DISCHARGE:  Stable. Dictated by:   Rosalyn Gess. Norins, M.D. [facility] Attending Physician:  Corwin Levins DD:  01/29/01 TD:  01/29/01 Job: [id number]

## 2010-10-12 NOTE — Procedures (Signed)
[facility]  Patient:    [name], [name] Visit Number: [id number] MRN: [id number]          Service Type: MED Location: 3W [phone] Attending Physician:  Corwin Levins Dictated by:   Hedwig Morton. Juanda Chance, M.D. [facility] Admit Date:  01/26/2001   CC:         Corwin Levins, M.D. [facility]   Procedure Report  PROCEDURE:  Upper endoscopy.  ENDOSCOPIST:  Hedwig Morton. Juanda Chance, M.D.  INDICATIONS:  This 65 year old white female was admitted with chest pain and severe constant discomfort about the level of the breast.  She is just over breast cancer.  Her pain has been partly positional.  Cardiac sources have been ruled out.  She has a history of gastroesophageal reflux which was evaluated one year ago by Dr. Jarold Motto with impressive findings on upper endoscopy of a 5 cm hiatal hernia, esophageal stricture and erosive esophagitis.  She has been on Protonix 40 mg q.d.  The dose has been increased to b.i.d. today without any improvement of the patient symptoms.  She is undergoing upper endoscopy to evaluate her chest pain.  ENDOSCOPE:  Olympus single-channel video endoscope.  SEDATION:  Versed 5 mg IV, Demerol 50 mg IV.  FINDINGS:  The Olympus single-channel videoscope was passed under direct vision through the posterior pharynx into the esophagus.  The patient was monitored by pulse oximeter.  Oxygen saturations were normal.  The proximal and distal esophageal mucosa were unremarkable with no evidence of hiatal hernia or stricture.  The squamocolumnar junction was normal with one short tongue of gastric mucosa which was 5 mm in length located on the esophageal side of the GE junction.  Again, there was no hiatal hernia.  STOMACH:  The stomach was insufflated with air and showed normal rugal folds. There was no food or blood in the stomach.  The gastric outlet was normal. Retroflexion of the endoscope revealed a normal fundus and cardia.  The patient was quite comfortable  during the procedure.  DUODENUM:  The duodenal bulb and descending duodenum were normal.  IMPRESSION:  Normal upper endoscopy, esophagus, stomach and duodenum.  PLAN:  The patients chest pain is noncardiac.  There is nothing to explain the patients chest pain in a GI basis.  She will have a CT scan of the chest tomorrow and will continue her treatment of gastroesophageal reflux with Protonix. Dictated by:   Hedwig Morton. Juanda Chance, M.D. [facility] Attending Physician:  Corwin Levins DD:  01/28/01 TD:  01/29/01 Job: [id number]

## 2010-10-12 NOTE — Procedures (Signed)
[place]  Patient:    [name], [name] Visit Number: [id number] MRN: [id number]          Service Type: END Location: ENDO Attending Physician:  Mardella Layman Dictated by:   [name]. Jarold Motto, M.D. [facility] Proc. Date: 02/16/01 Admit Date:  02/16/2001 Discharge Date: 02/16/2001                             Procedure Report  PROCEDURE:  Esophageal manometry.  1. Upper esophageal sphincter:  There appears to be normal coordination    between pharyngeal contractions and cricopharyngeal relaxation. 2. Lower esophageal sphincter:  Mean pressure is 30 mmHg with normal    relaxation with swallowing. 3. Motility pattern:  Normally propagated peristaltic waves throughout the    length of the esophagus in both wet and dry swallows.  Mean amplitude of    contractions is 57 mmHg.  ASSESSMENT:  This is a normal esophageal manometry without any evidence of an incompetent lower esophageal sphincter or an esophageal motility disturbance. Dictated by:   [name]. Jarold Motto, M.D. [facility] Attending Physician:  Mardella Layman DD:  02/20/01 TD:  02/20/01 Job: [id number]

## 2010-10-12 NOTE — H&P (Signed)
[facility]  Patient:    [name], [name] Visit Number: [id number] MRN: [id number]          Service Type: MED Location: 1E 0103 01 Attending Physician:  Shelba Flake Dictated by:   Corwin Levins, M.D. [facility] Admit Date:  01/26/2001   CC:         Richard A. Alanda Amass, M.D.   History and Physical  CHIEF COMPLAINT:  Decreased level of consciousness intermittently this morning associated with abdominal and chest discomfort.  HISTORY OF PRESENT ILLNESS:  The patient is a 65 year old white female with 2-3 weeks of feeling of fatigue, hot flashes and cold sweats intermittently with one episode in particular of left arm and neck tingling and pain three weeks ago, which seemed prolonged more than 30 minutes, but has not recurred. She felt worse last evening, with abdominal cramping.  She took Correctol about 9 p.m.  She awoke at 4 a.m. and it was somewhat difficult to have a bowel movement.  She was successful, however, but subsequently started feeling light headed and almost passed out.  She could not get back to the bed, but called her brother, who found her clammy, pale, in and out of it and vomiting a small amount of "mucus."  She still complains of stomach "on fire." as well as vague back and chest discomfort, currently mild, with mild shortness of breath, although she does not appear in distress in the ER.  Also incidentally, she had catfish for dinner last evening with a history of shellfish.  It is not clear whether this is related.  She also takes several over-the-counter health supplements including one or two for the gastrointestinal tract.  She states she is like her mother in that she tends to take things over-the-counter medications for various symptoms she has.  PAST MEDICAL HISTORY: 1. Breast cancer chest pain right mastectomy with chemotherapy only and    reconstructive surgery thereafter. 2. Hypertension. 3. Migraines. 4.  Postmenopausal. 5. GERD. 6. Notation of stress testing per Dr. Alanda Amass just over one year ago, which    was apparently negative per the patient.  PAST SURGICAL HISTORY: 1. Foot bunion surgery bilaterally. 2. Status post T&A.  ALLERGIES:  CODEINE, SULFA, and SHELLFISH.  MEDICATIONS: 1. Protonix 40 mg p.o. q.d. 2. Clonidine 0.1 TTS every week. 3. Multivitamin and several over-the-counter heath food supplements including    "black cohosh."  SOCIAL HISTORY:  No tobacco or alcohol.  Divorced.  Two children.  She lives with her brother in [place] in a duplex.  FAMILY HISTORY:  Father with coronary artery disease status post CABG and dementia.  Uncle deceased with Hodgkins.  Grandfather deceased with leukemia. Mother with DJD and hypertension.  REVIEW OF SYSTEMS:  Otherwise noncontributory.  There is no fever or other symptoms.  PHYSICAL EXAMINATION:  GENERAL:  A 65 year old white female, very nice, alert and oriented x 3.  VITAL SIGNS:  Blood pressure 157/89, heart rate 65, respirations 20, temperature 96.5.  HEENT:  Sclerae clear.  TMs clear.  Pharynx benign.  CHEST:  Without rales or wheezing.  CARDIAC:  Regular rate and rhythm.  No murmur.  ABDOMEN:  Diffusely mildly tender.  Positive bowel sounds.  No organomegaly.  EXTREMITIES:  No edema.  NEUROLOGIC:  Alert and oriented x 3.  Otherwise nonfocal.  LABORATORY DATA:  CPK-MB negative.  Troponin I pending.  ECG shows sinus rhythm.  Questionable septal infarct.  No acute changes. Chest x-ray shows no acute disease.  CMET and CBC pending at this time.  ASSESSMENT AND PLAN: 1. Syncope, likely vagal by history but, with chest and back pain, she is    admitted, placed on telemetry, given oxygen, rule out MI with serial    enzymes, check 2-D echocardiogram, follow-up CBC and CMET. 2. Abdominal pain, likely the source of all of the symptoms.  Questionable    constipation.  Will check abdominal films, rule out  obstruction, add    amylase and lipase to laps, continue the Protonix, add Phenergan on p.r.n.    basis. 3. Hypertension.  Continue clonidine for now. 4. Gastroesophageal reflux disease. 5. History of breast cancer. Dictated by:   Corwin Levins, M.D. [facility] Attending Physician:  Shelba Flake DD:  01/26/01 TD:  01/26/01 Job: [id number]

## 2010-11-22 ENCOUNTER — Ambulatory Visit: Payer: BC Managed Care – PPO | Admitting: Internal Medicine

## 2010-12-07 ENCOUNTER — Other Ambulatory Visit: Payer: Self-pay | Admitting: Internal Medicine

## 2011-02-06 ENCOUNTER — Ambulatory Visit (INDEPENDENT_AMBULATORY_CARE_PROVIDER_SITE_OTHER)
Admission: RE | Admit: 2011-02-06 | Discharge: 2011-02-06 | Disposition: A | Discharge status: Home / Self Care | Payer: PRIVATE HEALTH INSURANCE | Source: Ambulatory Visit | Attending: Internal Medicine | Admitting: Internal Medicine

## 2011-02-06 ENCOUNTER — Telehealth: Payer: Self-pay | Admitting: Internal Medicine

## 2011-02-06 ENCOUNTER — Other Ambulatory Visit (INDEPENDENT_AMBULATORY_CARE_PROVIDER_SITE_OTHER): Payer: PRIVATE HEALTH INSURANCE

## 2011-02-06 ENCOUNTER — Encounter: Payer: Self-pay | Admitting: Internal Medicine

## 2011-02-06 ENCOUNTER — Ambulatory Visit (INDEPENDENT_AMBULATORY_CARE_PROVIDER_SITE_OTHER): Payer: PRIVATE HEALTH INSURANCE | Admitting: Internal Medicine

## 2011-02-06 VITALS — BP 130/70 | HR 76 | Temp 98.9°F | Resp 16 | Wt 189.0 lb

## 2011-02-06 DIAGNOSIS — K219 Gastro-esophageal reflux disease without esophagitis: Secondary | ICD-10-CM

## 2011-02-06 DIAGNOSIS — R05 Cough: Secondary | ICD-10-CM

## 2011-02-06 DIAGNOSIS — M25512 Pain in left shoulder: Secondary | ICD-10-CM | POA: Insufficient documentation

## 2011-02-06 DIAGNOSIS — E559 Vitamin D deficiency, unspecified: Secondary | ICD-10-CM

## 2011-02-06 DIAGNOSIS — E538 Deficiency of other specified B group vitamins: Secondary | ICD-10-CM

## 2011-02-06 DIAGNOSIS — M545 Low back pain, unspecified: Secondary | ICD-10-CM

## 2011-02-06 DIAGNOSIS — M25519 Pain in unspecified shoulder: Secondary | ICD-10-CM

## 2011-02-06 DIAGNOSIS — F329 Major depressive disorder, single episode, unspecified: Secondary | ICD-10-CM

## 2011-02-06 DIAGNOSIS — Z23 Encounter for immunization: Secondary | ICD-10-CM

## 2011-02-06 DIAGNOSIS — R059 Cough, unspecified: Secondary | ICD-10-CM

## 2011-02-06 LAB — URINALYSIS
Bilirubin Urine: NEGATIVE
Ketones, ur: NEGATIVE
Leukocytes, UA: NEGATIVE
Urobilinogen, UA: 0.2 (ref 0.0–1.0)

## 2011-02-06 MED ORDER — MELOXICAM 15 MG PO TABS: 15.0000 mg | ORAL_TABLET | Freq: Every day | ORAL | Status: DC | PRN

## 2011-02-06 MED ORDER — FLUOXETINE HCL 10 MG PO TABS: 10.0000 mg | ORAL_TABLET | Freq: Every day | ORAL | Status: DC

## 2011-02-06 MED ORDER — RANITIDINE HCL 300 MG PO TABS: 300.0000 mg | ORAL_TABLET | Freq: Every day | ORAL | Status: DC

## 2011-02-06 NOTE — Assessment & Plan Note (Signed)
Continue with current prescription therapy as reflected on the Med list.  

## 2011-02-06 NOTE — Telephone Encounter (Signed)
Left mess for patient to call back.  

## 2011-02-06 NOTE — Assessment & Plan Note (Signed)
UA LS xray 

## 2011-02-06 NOTE — Assessment & Plan Note (Signed)
ROM exercise Mobic  Will inject if not well

## 2011-02-06 NOTE — Progress Notes (Signed)
  Subjective:    Patient ID: [name], female    DOB: 1946-05-09, 65 y.o.   MRN: [id number]  HPI  The patient presents for a follow-up of  chronic hypertension, chronic dyslipidemia, depression, controlled with medicines C/o L shoulder and LS spine pain (R>L) worse w/movement    Review of Systems  Constitutional: Negative for chills, activity change, appetite change, fatigue and unexpected weight change.  HENT: Negative for congestion, mouth sores and sinus pressure.   Eyes: Negative for visual disturbance.  Respiratory: Negative for cough and chest tightness.   Gastrointestinal: Negative for nausea and abdominal pain.  Genitourinary: Negative for frequency, difficulty urinating and vaginal pain.  Musculoskeletal: Negative for back pain and gait problem.  Skin: Negative for pallor and rash.  Neurological: Negative for dizziness, tremors, weakness, numbness and headaches.  Psychiatric/Behavioral: Negative for behavioral problems, confusion and sleep disturbance. The patient is not nervous/anxious.        Objective:   Physical Exam  Constitutional: She appears well-developed and well-nourished. No distress.  HENT:  Head: Normocephalic.  Right Ear: External ear normal.  Left Ear: External ear normal.  Nose: Nose normal.  Mouth/Throat: Oropharynx is clear and moist.  Eyes: Conjunctivae are normal. Pupils are equal, round, and reactive to light. Right eye exhibits no discharge. Left eye exhibits no discharge.  Neck: Normal range of motion. Neck supple. No JVD present. No tracheal deviation present. No thyromegaly present.  Cardiovascular: Normal rate, regular rhythm and normal heart sounds.   Pulmonary/Chest: No stridor. No respiratory distress. She has no wheezes.  Abdominal: Soft. Bowel sounds are normal. She exhibits no distension and no mass. There is no tenderness. There is no rebound and no guarding.  Musculoskeletal: She exhibits no edema and no tenderness.       LS NT    Lymphadenopathy:    She has no cervical adenopathy.  Neurological: She displays normal reflexes. No cranial nerve deficit. She exhibits normal muscle tone. Coordination normal.  Skin: No rash noted. No erythema.  Psychiatric: She has a normal mood and affect. Her behavior is normal. Judgment and thought content normal.    Lab Results  Component Value Date   WBC 5.4 05/22/2010   HGB 14.8 05/22/2010   HCT 42.6 05/22/2010   PLT 163.0 05/22/2010   CHOL 205* 05/22/2010   TRIG 100.0 05/22/2010   HDL 46.40 05/22/2010   LDLDIRECT 150.6 05/22/2010   ALT 22 05/22/2010   AST 29 05/22/2010   NA 142 05/22/2010   K 4.1 05/22/2010   CL 104 05/22/2010   CREATININE 0.7 05/22/2010   BUN 17 05/22/2010   CO2 29 05/22/2010   TSH 1.57 05/22/2010   INR 1.0 ratio 11/21/2009         Assessment & Plan:

## 2011-02-06 NOTE — Assessment & Plan Note (Signed)
Better  

## 2011-02-06 NOTE — Assessment & Plan Note (Signed)
Try Zantac 

## 2011-02-06 NOTE — Telephone Encounter (Signed)
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

## 2011-02-06 NOTE — Assessment & Plan Note (Signed)
Worse sx now Start Ranitidine

## 2011-02-07 NOTE — Telephone Encounter (Signed)
Pt informed

## 2011-02-10 ENCOUNTER — Encounter: Payer: Self-pay | Admitting: Internal Medicine

## 2011-02-26 LAB — URINALYSIS, ROUTINE W REFLEX MICROSCOPIC
Bilirubin Urine: NEGATIVE
Glucose, UA: NEGATIVE
Hgb urine dipstick: NEGATIVE
Protein, ur: NEGATIVE
Urobilinogen, UA: 1

## 2011-03-06 ENCOUNTER — Telehealth: Payer: Self-pay | Admitting: Internal Medicine

## 2011-03-06 ENCOUNTER — Ambulatory Visit (INDEPENDENT_AMBULATORY_CARE_PROVIDER_SITE_OTHER): Payer: PRIVATE HEALTH INSURANCE | Admitting: Internal Medicine

## 2011-03-06 ENCOUNTER — Other Ambulatory Visit (INDEPENDENT_AMBULATORY_CARE_PROVIDER_SITE_OTHER): Payer: PRIVATE HEALTH INSURANCE

## 2011-03-06 ENCOUNTER — Encounter: Payer: Self-pay | Admitting: Internal Medicine

## 2011-03-06 VITALS — BP 150/86 | HR 76 | Temp 98.5°F | Resp 16 | Wt 190.0 lb

## 2011-03-06 DIAGNOSIS — R5381 Other malaise: Secondary | ICD-10-CM

## 2011-03-06 DIAGNOSIS — R5383 Other fatigue: Secondary | ICD-10-CM

## 2011-03-06 DIAGNOSIS — T148XXA Other injury of unspecified body region, initial encounter: Secondary | ICD-10-CM | POA: Insufficient documentation

## 2011-03-06 DIAGNOSIS — IMO0001 Reserved for inherently not codable concepts without codable children: Secondary | ICD-10-CM

## 2011-03-06 DIAGNOSIS — M25512 Pain in left shoulder: Secondary | ICD-10-CM

## 2011-03-06 DIAGNOSIS — E538 Deficiency of other specified B group vitamins: Secondary | ICD-10-CM

## 2011-03-06 DIAGNOSIS — F329 Major depressive disorder, single episode, unspecified: Secondary | ICD-10-CM

## 2011-03-06 DIAGNOSIS — M25519 Pain in unspecified shoulder: Secondary | ICD-10-CM

## 2011-03-06 DIAGNOSIS — F3289 Other specified depressive episodes: Secondary | ICD-10-CM

## 2011-03-06 LAB — HEPATIC FUNCTION PANEL
AST: 22 U/L (ref 0–37)
Albumin: 4.1 g/dL (ref 3.5–5.2)
Alkaline Phosphatase: 80 U/L (ref 39–117)
Total Bilirubin: 0.6 mg/dL (ref 0.3–1.2)

## 2011-03-06 LAB — BASIC METABOLIC PANEL
BUN: 16 mg/dL (ref 6–23)
CO2: 30 mEq/L (ref 19–32)
Chloride: 107 mEq/L (ref 96–112)
Potassium: 3.9 mEq/L (ref 3.5–5.1)

## 2011-03-06 LAB — PROTIME-INR: INR: 0.9 ratio (ref 0.8–1.0)

## 2011-03-06 LAB — VITAMIN B12: Vitamin B-12: 303 pg/mL (ref 211–911)

## 2011-03-06 MED ORDER — METHYLPREDNISOLONE ACETATE 40 MG/ML IJ SUSP: 40.0000 mg | Freq: Once | INTRAMUSCULAR | Status: DC

## 2011-03-06 MED ORDER — VALSARTAN-HYDROCHLOROTHIAZIDE 320-25 MG PO TABS: 1.0000 | ORAL_TABLET | Freq: Every day | ORAL | Status: DC

## 2011-03-06 NOTE — Patient Instructions (Signed)
   Postprocedure instructions :    A Band-Aid should be left on for 12 hours. Injection therapy is not a cure itself. It is used in conjunction with other modalities. You can use nonsteroidal anti-inflammatories like ibuprofen , hot and cold compresses. Rest is recommended in the next 24 hours. You need to report immediately  if fever, chills or any signs of infection develop.   You can try Vimovo 1 a day in plce of Meloxicam

## 2011-03-06 NOTE — Assessment & Plan Note (Signed)
Check labs 

## 2011-03-06 NOTE — Progress Notes (Signed)
  Subjective:    Patient ID: [name], female    DOB: [birth year], 65 y.o.   MRN: [id number]  HPI  F/u HTN and L shoulder pain - not much better. GERD is better. General OA is better.  Review of Systems  Constitutional: Positive for fatigue. Negative for chills, activity change, appetite change and unexpected weight change.  HENT: Negative for congestion, mouth sores and sinus pressure.   Eyes: Negative for visual disturbance.  Respiratory: Negative for cough and chest tightness.   Gastrointestinal: Negative for nausea and abdominal pain.  Genitourinary: Negative for frequency, difficulty urinating and vaginal pain.  Musculoskeletal: Positive for back pain and arthralgias. Negative for gait problem.  Skin: Negative for pallor and rash.  Neurological: Negative for dizziness, tremors, weakness, numbness and headaches.  Psychiatric/Behavioral: Negative for confusion and sleep disturbance.       Objective:   Physical Exam  Constitutional: She appears well-developed and well-nourished. No distress.  HENT:  Head: Normocephalic.  Right Ear: External ear normal.  Left Ear: External ear normal.  Nose: Nose normal.  Mouth/Throat: Oropharynx is clear and moist.  Eyes: Conjunctivae are normal. Pupils are equal, round, and reactive to light. Right eye exhibits no discharge. Left eye exhibits no discharge.  Neck: Normal range of motion. Neck supple. No JVD present. No tracheal deviation present. No thyromegaly present.  Cardiovascular: Normal rate, regular rhythm and normal heart sounds.   Pulmonary/Chest: No stridor. No respiratory distress. She has no wheezes.  Abdominal: Soft. Bowel sounds are normal. She exhibits no distension and no mass. There is no tenderness. There is no rebound and no guarding.  Musculoskeletal: She exhibits tenderness (L shoulder is tender). She exhibits no edema.  Lymphadenopathy:    She has no cervical adenopathy.  Neurological: She displays normal reflexes.  No cranial nerve deficit. She exhibits normal muscle tone. Coordination normal.  Skin: No rash noted. No erythema.  Psychiatric: She has a normal mood and affect. Her behavior is normal. Judgment and thought content normal.    Procedure :Joint Injection,   L shoulder   Indication:  Subacromial bursitis with refractory  chronic pain.   Risks including unsuccessful procedure , bleeding, infection, bruising, skin atrophy and others were explained to the patient in detail as well as the benefits. Informed consent was obtained and signed.   Tthe patient was placed in a comfortable position. Lateral approach was used. Skin was prepped with Betadine and alcohol Then, a 5 cc syringe with a 2 inch long 24-gauge needle was used for a joint injection.. The needle was advanced  Into the subacromial space.The bursa was injected with 3 mL of 2% lidocaine and 40 mg of Depo-Medrol .  Band-Aid was applied.   Tolerated well. Complications: None. Good pain relief following the procedure.   Postprocedure instructions :    A Band-Aid should be left on for 12 hours. Injection therapy is not a cure itself. It is used in conjunction with other modalities. You can use nonsteroidal anti-inflammatories like ibuprofen , hot and cold compresses. Rest is recommended in the next 24 hours. You need to report immediately  if fever, chills or any signs of infection develop.         Assessment & Plan:

## 2011-03-06 NOTE — Assessment & Plan Note (Signed)
Continue with current prescription therapy as reflected on the Med list.  

## 2011-03-06 NOTE — Telephone Encounter (Signed)
Stacey, please, inform patient that all labs are ok thx   

## 2011-03-06 NOTE — Assessment & Plan Note (Signed)
Will inject today

## 2011-03-06 NOTE — Assessment & Plan Note (Signed)
Better  

## 2011-03-07 NOTE — Telephone Encounter (Signed)
Pt informed

## 2011-04-01 ENCOUNTER — Other Ambulatory Visit: Payer: Self-pay | Admitting: Obstetrics and Gynecology

## 2011-04-01 DIAGNOSIS — Z1231 Encounter for screening mammogram for malignant neoplasm of breast: Secondary | ICD-10-CM

## 2011-04-01 DIAGNOSIS — Z9011 Acquired absence of right breast and nipple: Secondary | ICD-10-CM

## 2011-05-06 ENCOUNTER — Ambulatory Visit
Admission: RE | Admit: 2011-05-06 | Discharge: 2011-05-06 | Disposition: A | Discharge status: Home / Self Care | Payer: Medicare Other | Source: Ambulatory Visit | Attending: Obstetrics and Gynecology | Admitting: Obstetrics and Gynecology

## 2011-05-06 DIAGNOSIS — Z9011 Acquired absence of right breast and nipple: Secondary | ICD-10-CM

## 2011-05-06 DIAGNOSIS — Z1231 Encounter for screening mammogram for malignant neoplasm of breast: Secondary | ICD-10-CM

## 2011-06-10 ENCOUNTER — Ambulatory Visit: Payer: PRIVATE HEALTH INSURANCE | Admitting: Internal Medicine

## 2011-08-05 ENCOUNTER — Telehealth: Payer: Self-pay

## 2011-08-05 DIAGNOSIS — J329 Chronic sinusitis, unspecified: Secondary | ICD-10-CM

## 2011-08-05 NOTE — Telephone Encounter (Signed)
Pt called requesting a referral to an ENT 

## 2011-08-05 NOTE — Telephone Encounter (Signed)
What is the problem? Thx 

## 2011-08-05 NOTE — Telephone Encounter (Signed)
Pt called requesting a referral to an ENT

## 2011-08-06 NOTE — Telephone Encounter (Signed)
Pt stated she was seen at local [facility] for sinusitis and was advised to see ENT for sinus infections.

## 2011-08-06 NOTE — Telephone Encounter (Signed)
Ok Thx 

## 2011-08-09 ENCOUNTER — Encounter: Payer: Self-pay | Admitting: Gastroenterology

## 2011-08-09 ENCOUNTER — Ambulatory Visit (INDEPENDENT_AMBULATORY_CARE_PROVIDER_SITE_OTHER): Payer: Medicare Other | Admitting: Gastroenterology

## 2011-08-09 VITALS — BP 134/62 | HR 60 | Ht 69.0 in | Wt 182.0 lb

## 2011-08-09 DIAGNOSIS — R05 Cough: Secondary | ICD-10-CM

## 2011-08-09 DIAGNOSIS — R059 Cough, unspecified: Secondary | ICD-10-CM

## 2011-08-09 DIAGNOSIS — K140 Glossitis: Secondary | ICD-10-CM

## 2011-08-09 DIAGNOSIS — R053 Chronic cough: Secondary | ICD-10-CM

## 2011-08-09 DIAGNOSIS — Z9889 Other specified postprocedural states: Secondary | ICD-10-CM

## 2011-08-09 HISTORY — DX: Other specified postprocedural states: Z98.890

## 2011-08-09 MED ORDER — DPH-LIDO-ALHYDR-MGHYDR-SIMETH MT SUSP: 5.0000 mL | Freq: Four times a day (QID) | OROMUCOSAL | Status: DC

## 2011-08-09 NOTE — Progress Notes (Signed)
This is a very nice 66 year old Caucasian female who is status post fundoplication surgery for refractory reflux. She has been doing well taking Zantac 300 mg at bedtime, but has recently had coughing, hoarseness, and recurrent sinusitis and bronchitis with several courses of antibiotics. Apparently recent ENT evaluation was unremarkable. She complains of a coated tongue and a bad taste in her mouth, but denies dysphagia or painful swallowing or any other gastrointestinal or hepatobiliary or systemic complaints. Rest endoscopy was in October of 2010 . and was unremarkable.  Current Medications, Allergies, Past Medical History, Past Surgical History, Family History and Social History were reviewed in [employer] record.  Pertinent Review of Systems Negative.. no specific cardiovascular or pulmonary or collagen vascular disease symptomatology.   Physical Exam: Dictation no acute distress. Blood pressure 134/82 and pulse 60 and regular. PMI 26.8 8. Appreciate stigmata of chronic liver disease or thyromegaly. Examination oropharynx does show some white coat into her tongue but no definite oral pharyngeal plaques. Chest is clear she is a regular rhythm without murmurs gallops or rubs. Abdominal exam shows no organomegaly, masses or tenderness. Bowel sounds are normal. Mental status is normal.    Assessment and Plan: She is status post fundoplication surgery with good response. I suspect she has an element of Candida glossitis and perhaps esophagitis from her multiple antibiotics. I prescribed Mycostatin mouthwash cc swish and swallow 4 times a day. If she is not improved in one week for proceed with repeat endoscopic exam. Have urged her to take her other medications as listed and reviewed in her record. No diagnosis found.

## 2011-08-09 NOTE — Patient Instructions (Signed)
We are sending in your new medication to your pharmacy Call us after one week yo let us know how you are doing

## 2011-08-30 ENCOUNTER — Ambulatory Visit: Payer: Self-pay | Admitting: Internal Medicine

## 2011-12-16 ENCOUNTER — Other Ambulatory Visit: Payer: Self-pay | Admitting: Internal Medicine

## 2012-02-10 ENCOUNTER — Other Ambulatory Visit (INDEPENDENT_AMBULATORY_CARE_PROVIDER_SITE_OTHER): Payer: Medicare Other

## 2012-02-10 ENCOUNTER — Encounter: Payer: Self-pay | Admitting: Internal Medicine

## 2012-02-10 ENCOUNTER — Ambulatory Visit (INDEPENDENT_AMBULATORY_CARE_PROVIDER_SITE_OTHER): Payer: Medicare Other | Admitting: Internal Medicine

## 2012-02-10 VITALS — BP 128/90 | HR 72 | Temp 97.7°F | Resp 16 | Wt 177.0 lb

## 2012-02-10 DIAGNOSIS — K219 Gastro-esophageal reflux disease without esophagitis: Secondary | ICD-10-CM

## 2012-02-10 DIAGNOSIS — R0683 Snoring: Secondary | ICD-10-CM

## 2012-02-10 DIAGNOSIS — M25512 Pain in left shoulder: Secondary | ICD-10-CM

## 2012-02-10 DIAGNOSIS — M25519 Pain in unspecified shoulder: Secondary | ICD-10-CM

## 2012-02-10 DIAGNOSIS — C4360 Malignant melanoma of unspecified upper limb, including shoulder: Secondary | ICD-10-CM

## 2012-02-10 DIAGNOSIS — R5381 Other malaise: Secondary | ICD-10-CM

## 2012-02-10 DIAGNOSIS — IMO0001 Reserved for inherently not codable concepts without codable children: Secondary | ICD-10-CM

## 2012-02-10 DIAGNOSIS — K222 Esophageal obstruction: Secondary | ICD-10-CM

## 2012-02-10 DIAGNOSIS — F329 Major depressive disorder, single episode, unspecified: Secondary | ICD-10-CM

## 2012-02-10 DIAGNOSIS — R0989 Other specified symptoms and signs involving the circulatory and respiratory systems: Secondary | ICD-10-CM

## 2012-02-10 DIAGNOSIS — E538 Deficiency of other specified B group vitamins: Secondary | ICD-10-CM

## 2012-02-10 DIAGNOSIS — Z23 Encounter for immunization: Secondary | ICD-10-CM

## 2012-02-10 DIAGNOSIS — R0609 Other forms of dyspnea: Secondary | ICD-10-CM

## 2012-02-10 DIAGNOSIS — I10 Essential (primary) hypertension: Secondary | ICD-10-CM

## 2012-02-10 DIAGNOSIS — F3289 Other specified depressive episodes: Secondary | ICD-10-CM

## 2012-02-10 DIAGNOSIS — Z853 Personal history of malignant neoplasm of breast: Secondary | ICD-10-CM

## 2012-02-10 DIAGNOSIS — R609 Edema, unspecified: Secondary | ICD-10-CM

## 2012-02-10 DIAGNOSIS — R5383 Other fatigue: Secondary | ICD-10-CM

## 2012-02-10 DIAGNOSIS — E559 Vitamin D deficiency, unspecified: Secondary | ICD-10-CM

## 2012-02-10 DIAGNOSIS — R0602 Shortness of breath: Secondary | ICD-10-CM

## 2012-02-10 HISTORY — DX: Malignant melanoma of unspecified upper limb, including shoulder: C43.60

## 2012-02-10 LAB — CBC WITH DIFFERENTIAL/PLATELET
Basophils Absolute: 0 10*3/uL (ref 0.0–0.1)
HCT: 44 % (ref 36.0–46.0)
Lymphs Abs: 1.3 10*3/uL (ref 0.7–4.0)
MCV: 92.5 fl (ref 78.0–100.0)
Monocytes Absolute: 0.5 10*3/uL (ref 0.1–1.0)
Monocytes Relative: 7.8 % (ref 3.0–12.0)
Neutrophils Relative %: 66.3 % (ref 43.0–77.0)
Platelets: 173 10*3/uL (ref 150.0–400.0)
RDW: 13.3 % (ref 11.5–14.6)

## 2012-02-10 LAB — LIPID PANEL
HDL: 43.4 mg/dL (ref >39.00)
Triglycerides: 103 mg/dL (ref 0.0–149.0)
VLDL: 20.6 mg/dL (ref 0.0–40.0)

## 2012-02-10 LAB — BASIC METABOLIC PANEL
BUN: 16 mg/dL (ref 6–23)
Creatinine, Ser: 0.9 mg/dL (ref 0.4–1.2)
GFR: 66.57 mL/min (ref >60.00)
Glucose, Bld: 96 mg/dL (ref 70–99)
Potassium: 5.1 mEq/L (ref 3.5–5.1)

## 2012-02-10 LAB — VITAMIN B12: Vitamin B-12: 806 pg/mL (ref 211–911)

## 2012-02-10 LAB — TSH: TSH: 2.06 u[IU]/mL (ref 0.35–5.50)

## 2012-02-10 LAB — LDL CHOLESTEROL, DIRECT: Direct LDL: 179.7 mg/dL

## 2012-02-10 MED ORDER — POTASSIUM CHLORIDE CRYS ER 20 MEQ PO TBCR: 20.0000 meq | EXTENDED_RELEASE_TABLET | Freq: Every day | ORAL | Status: DC

## 2012-02-10 MED ORDER — FLUOXETINE HCL 10 MG PO CAPS: 10.0000 mg | ORAL_CAPSULE | Freq: Every day | ORAL | Status: DC

## 2012-02-10 MED ORDER — FUROSEMIDE 40 MG PO TABS: 40.0000 mg | ORAL_TABLET | Freq: Every day | ORAL | Status: DC

## 2012-02-10 MED ORDER — OMEPRAZOLE 40 MG PO CPDR: 40.0000 mg | DELAYED_RELEASE_CAPSULE | Freq: Every day | ORAL | Status: DC

## 2012-02-10 NOTE — Assessment & Plan Note (Signed)
Mild Off meds 2013

## 2012-02-10 NOTE — Assessment & Plan Note (Signed)
Continue with current prescription therapy as reflected on the Med list. Labs  

## 2012-02-10 NOTE — Assessment & Plan Note (Signed)
Better  

## 2012-02-10 NOTE — Assessment & Plan Note (Signed)
Sleep test 

## 2012-02-10 NOTE — Assessment & Plan Note (Signed)
Continue with current prescription therapy as reflected on the Med list.  

## 2012-02-10 NOTE — Progress Notes (Signed)
  Subjective:    Patient ID: [name], female    DOB: 01-14-1946, 66 y.o.   MRN: [id number]  HPI  F/u HTN and L shoulder pain - not much better. GERD is worse (s/p Nissen's). General OA is better.  BP Readings from Last 3 Encounters:  02/10/12 128/90  08/09/11 134/62  03/06/11 150/86   Wt Readings from Last 3 Encounters:  02/10/12 177 lb (80.287 kg)  08/09/11 182 lb (82.555 kg)  03/06/11 190 lb (86.183 kg)      Review of Systems  Constitutional: Positive for fatigue. Negative for chills, activity change, appetite change and unexpected weight change.  HENT: Negative for congestion, mouth sores and sinus pressure.   Eyes: Negative for visual disturbance.  Respiratory: Negative for cough and chest tightness.   Gastrointestinal: Negative for nausea and abdominal pain.  Genitourinary: Negative for frequency, difficulty urinating and vaginal pain.  Musculoskeletal: Positive for back pain and arthralgias. Negative for gait problem.  Skin: Negative for pallor and rash.  Neurological: Negative for dizziness, tremors, weakness, numbness and headaches.  Psychiatric/Behavioral: Negative for suicidal ideas, confusion and disturbed wake/sleep cycle.       Objective:   Physical Exam  Constitutional: She appears well-developed and well-nourished. No distress.  HENT:  Head: Normocephalic.  Right Ear: External ear normal.  Left Ear: External ear normal.  Nose: Nose normal.  Mouth/Throat: Oropharynx is clear and moist.  Eyes: Conjunctivae normal are normal. Pupils are equal, round, and reactive to light. Right eye exhibits no discharge. Left eye exhibits no discharge.  Neck: Normal range of motion. Neck supple. No JVD present. No tracheal deviation present. No thyromegaly present.  Cardiovascular: Normal rate, regular rhythm and normal heart sounds.   Pulmonary/Chest: No stridor. No respiratory distress. She has no wheezes.  Abdominal: Soft. Bowel sounds are normal. She exhibits no  distension and no mass. There is no tenderness. There is no rebound and no guarding.  Musculoskeletal: She exhibits tenderness (L shoulder is tender). She exhibits no edema.  Lymphadenopathy:    She has no cervical adenopathy.  Neurological: She displays normal reflexes. No cranial nerve deficit. She exhibits normal muscle tone. Coordination normal.  Skin: No rash noted. No erythema.  Psychiatric: She has a normal mood and affect. Her behavior is normal. Judgment and thought content normal.     Lab Results  Component Value Date   WBC 5.4 05/22/2010   HGB 14.8 05/22/2010   HCT 42.6 05/22/2010   PLT 163.0 05/22/2010   GLUCOSE 100* 03/06/2011   CHOL 205* 05/22/2010   TRIG 100.0 05/22/2010   HDL 46.40 05/22/2010   LDLDIRECT 150.6 05/22/2010   LDLCALC 145* 10/08/2007   ALT 16 03/06/2011   AST 22 03/06/2011   NA 143 03/06/2011   K 3.9 03/06/2011   CL 107 03/06/2011   CREATININE 0.9 03/06/2011   BUN 16 03/06/2011   CO2 30 03/06/2011   TSH 1.43 03/06/2011   INR 0.9 03/06/2011          Assessment & Plan:

## 2012-02-10 NOTE — Assessment & Plan Note (Signed)
2010 R  Elbow ([facility]) Drm q 12 mo

## 2012-02-10 NOTE — Assessment & Plan Note (Signed)
Continue with current prescription therapy as reflected on the Med list. labs 

## 2012-02-11 ENCOUNTER — Telehealth: Payer: Self-pay | Admitting: Internal Medicine

## 2012-02-11 NOTE — Telephone Encounter (Signed)
[name], please, inform patient that all labs are normal except for elev chol 236 Thx

## 2012-02-12 NOTE — Assessment & Plan Note (Signed)
GI cons if neded See meds

## 2012-02-12 NOTE — Assessment & Plan Note (Signed)
Resolved

## 2012-02-12 NOTE — Telephone Encounter (Signed)
Pt informed

## 2012-02-12 NOTE — Assessment & Plan Note (Signed)
See meds 

## 2012-02-12 NOTE — Assessment & Plan Note (Signed)
See meds GI cons if needed

## 2012-03-31 ENCOUNTER — Other Ambulatory Visit: Payer: Self-pay | Admitting: Obstetrics and Gynecology

## 2012-03-31 DIAGNOSIS — Z9889 Other specified postprocedural states: Secondary | ICD-10-CM

## 2012-03-31 DIAGNOSIS — Z853 Personal history of malignant neoplasm of breast: Secondary | ICD-10-CM

## 2012-03-31 DIAGNOSIS — Z1231 Encounter for screening mammogram for malignant neoplasm of breast: Secondary | ICD-10-CM

## 2012-05-01 ENCOUNTER — Encounter (HOSPITAL_BASED_OUTPATIENT_CLINIC_OR_DEPARTMENT_OTHER): Payer: Self-pay | Admitting: *Deleted

## 2012-05-01 ENCOUNTER — Emergency Department (HOSPITAL_BASED_OUTPATIENT_CLINIC_OR_DEPARTMENT_OTHER)
Admission: EM | Admit: 2012-05-01 | Discharge: 2012-05-02 | Disposition: A | Discharge status: Home / Self Care | Payer: Medicare Other | Attending: Emergency Medicine | Admitting: Emergency Medicine

## 2012-05-01 DIAGNOSIS — F329 Major depressive disorder, single episode, unspecified: Secondary | ICD-10-CM | POA: Insufficient documentation

## 2012-05-01 DIAGNOSIS — Z85828 Personal history of other malignant neoplasm of skin: Secondary | ICD-10-CM | POA: Insufficient documentation

## 2012-05-01 DIAGNOSIS — E538 Deficiency of other specified B group vitamins: Secondary | ICD-10-CM | POA: Insufficient documentation

## 2012-05-01 DIAGNOSIS — W19XXXA Unspecified fall, initial encounter: Secondary | ICD-10-CM

## 2012-05-01 DIAGNOSIS — F3289 Other specified depressive episodes: Secondary | ICD-10-CM | POA: Insufficient documentation

## 2012-05-01 DIAGNOSIS — Z8709 Personal history of other diseases of the respiratory system: Secondary | ICD-10-CM | POA: Insufficient documentation

## 2012-05-01 DIAGNOSIS — K219 Gastro-esophageal reflux disease without esophagitis: Secondary | ICD-10-CM | POA: Insufficient documentation

## 2012-05-01 DIAGNOSIS — E559 Vitamin D deficiency, unspecified: Secondary | ICD-10-CM | POA: Insufficient documentation

## 2012-05-01 DIAGNOSIS — S91109A Unspecified open wound of unspecified toe(s) without damage to nail, initial encounter: Secondary | ICD-10-CM | POA: Insufficient documentation

## 2012-05-01 DIAGNOSIS — I10 Essential (primary) hypertension: Secondary | ICD-10-CM | POA: Insufficient documentation

## 2012-05-01 DIAGNOSIS — Y9301 Activity, walking, marching and hiking: Secondary | ICD-10-CM | POA: Insufficient documentation

## 2012-05-01 DIAGNOSIS — R296 Repeated falls: Secondary | ICD-10-CM | POA: Insufficient documentation

## 2012-05-01 DIAGNOSIS — Z79899 Other long term (current) drug therapy: Secondary | ICD-10-CM | POA: Insufficient documentation

## 2012-05-01 DIAGNOSIS — S91119A Laceration without foreign body of unspecified toe without damage to nail, initial encounter: Secondary | ICD-10-CM

## 2012-05-01 DIAGNOSIS — Y9289 Other specified places as the place of occurrence of the external cause: Secondary | ICD-10-CM | POA: Insufficient documentation

## 2012-05-01 DIAGNOSIS — Z853 Personal history of malignant neoplasm of breast: Secondary | ICD-10-CM | POA: Insufficient documentation

## 2012-05-01 MED ORDER — LIDOCAINE HCL 2 % IJ SOLN
INTRAMUSCULAR | Status: AC
  Administered 2012-05-01: 400 mg
  Filled 2012-05-01: qty 20

## 2012-05-01 NOTE — ED Notes (Signed)
MD at bedside. 

## 2012-05-01 NOTE — ED Notes (Signed)
Pt reports she fell walking up the stairs and hit both arms left knee and has laceration to underside of right great toe- wound care in triage

## 2012-05-02 NOTE — ED Provider Notes (Signed)
History     CSN: [id number]  Arrival date & time 05/01/12  2124   First MD Initiated Contact with Patient 05/01/12 2320      Chief Complaint  Patient presents with  . Toe Injury  . Fall    (Consider location/radiation/quality/duration/timing/severity/associated sxs/prior treatment) Patient is a 66 y.o. female presenting with fall. The history is provided by the patient.  Fall The accident occurred 1 to 2 hours ago. The fall occurred while walking (fell going up the stairs and cut her right great toe). She landed on a hard floor. The volume of blood lost was moderate. The point of impact was the left knee and right knee. Pain location: right great toe. The pain is at a severity of 4/10. The pain is moderate. She was ambulatory at the scene. Pertinent negatives include no loss of consciousness. The symptoms are aggravated by use of the injured limb and pressure on the injury. She has tried nothing for the symptoms. The treatment provided no relief.    Past Medical History  Diagnosis Date  . GERD (gastroesophageal reflux disease)   . Hypertension   . Depression   . Breast cancer 1995  . Vitamin D deficiency   . Vitamin B12 deficiency   . Dyspnea     Normal Spirometry 03/2008 EF 65% BNP normal 11/2007  . 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 Mastectomy 1996    RT, no lymph nodes, chemotherapy  . Tonsillectomy   . Temporomandibular joint surgery   . Nissen fundoplication 09/2008  . Melanoma excision     From elbow-- Done at [facility] History  Problem Relation Age of Onset  . Stroke Other     F 1st degree relative 59, M 1st degree relative  . Colon cancer Cousin     Maternal Side  . Colon cancer Cousin     Paternal Side  . Colon polyps Father     History  Substance Use Topics  . Smoking status: Never Smoker   . Smokeless tobacco: Never Used     Comment: Regular Exercise - Yes  . Alcohol Use: No    OB History    Grav Para  Term Preterm Abortions TAB SAB Ect Mult Living                  Review of Systems  Neurological: Negative for loss of consciousness.  All other systems reviewed and are negative.    Allergies  Ace inhibitors; Codeine; Sulfonamide derivatives; and Telmisartan  Home Medications   Current Outpatient Rx  Name  Route  Sig  Dispense  Refill  . ACETAMINOPHEN 500 MG PO TABS   Oral   Take 500 mg by mouth as needed.           Marland Kitchen VITAMIN D3 1000 UNITS PO CAPS   Oral   Take 1 capsule by mouth daily.           Marland Kitchen VITAMIN B-12 ER 1000 MCG PO TBCR   Oral   Take 1 tablet by mouth daily.           Marland Kitchen EPINEPHRINE 0.3 MG/0.3ML IJ DEVI   Intramuscular   Inject 0.3 mg into the muscle once.           Marland Kitchen FLUOXETINE HCL 10 MG PO CAPS   Oral   Take 1 capsule (10 mg total) by mouth daily.   30  capsule   5   . FUROSEMIDE 40 MG PO TABS   Oral   Take 1 tablet (40 mg total) by mouth daily.   30 tablet   11   . IBUPROFEN 200 MG PO TABS   Oral   Take 200 mg by mouth as needed.         Marland Kitchen OMEPRAZOLE 40 MG PO CPDR   Oral   Take 1 capsule (40 mg total) by mouth daily.   30 capsule   11   . RED YEAST RICE 600 MG PO TABS   Oral   Take 1 tablet by mouth 2 (two) times daily.           Marland Kitchen BIOTIN 5 MG PO CAPS   Oral   Take by mouth as directed.         Marland Kitchen POTASSIUM CHLORIDE CRYS ER 20 MEQ PO TBCR   Oral   Take 1 tablet (20 mEq total) by mouth daily.   30 tablet   11   . RANITIDINE HCL 300 MG PO TABS   Oral   Take 300 mg by mouth at bedtime.           BP 157/70  Pulse 80  Temp 99.1 F (37.3 C) (Oral)  Resp 18  Ht 5' 8.5" (1.74 m)  Wt 182 lb (82.555 kg)  BMI 27.27 kg/m2  SpO2 98%  Physical Exam  Nursing note and vitals reviewed. Constitutional: She is oriented to person, place, and time. She appears well-developed and well-nourished. No distress.  HENT:  Head: Normocephalic and atraumatic.  Eyes: EOM are normal. Pupils are equal, round, and reactive to light.    Musculoskeletal:       Right foot: She exhibits tenderness and laceration. She exhibits normal range of motion, normal capillary refill and no deformity.       Feet:       Laceration to the plantar surface of the right great toe.  Normal cap refill. Normal flex/ext of great toe and normal sensation  Neurological: She is alert and oriented to person, place, and time. She has normal strength. No sensory deficit.  Skin: Skin is warm and dry. No rash noted. No erythema.    ED Course  Procedures (including critical care time)  Labs Reviewed - No data to display No results found. LACERATION REPAIR Performed by: Gwyneth Sprout Authorized byGwyneth Sprout Consent: Verbal consent obtained. Risks and benefits: risks, benefits and alternatives were discussed Consent given by: patient Patient identity confirmed: provided demographic data Prepped and Draped in normal sterile fashion Wound explored  Laceration Location: Plantar surface of the right great toe  Laceration Length: 4 cm  No Foreign Bodies seen or palpated  Anesthesia: local infiltration  Local anesthetic: lidocaine 1 % without epinephrine  Anesthetic total: 4 ml  Irrigation method: syringe Amount of cleaning: standard  Skin closure: 4.0 Prolene   Number of sutures: 8   Technique: Simple interrupted   Patient tolerance: Patient tolerated the procedure well with no immediate complications.   1. Fall   2. Toe laceration       MDM   Patient with a mechanical fall on the steps sustaining a laceration to her right great toe. Wound repaired as above. Patient is unclear when her last tetanus shot was however it was overlooked while she was in the ER tonight and the patient was called. She's returning here for suture removal and will check with her doctor and if need be get a  tetanus shot when she returns. Patient does have some mild bruising to bilateral knees but was able to ambulate and doubt any other acute  injuries at this time.        Gwyneth Sprout, MD 05/02/12 [phone]

## 2012-05-02 NOTE — ED Notes (Signed)
I completed wound care with bacitracin and kerlix wrap. I then fit and adjusted post op boot/shoe.

## 2012-05-02 NOTE — ED Notes (Signed)
Left Pt. A message about getting a Tdap when she comes for suture removal and if she has had one in last 5 yrs to let us know.  Encouraged Pt. To ask for the Tdap when she returns for suture removal.

## 2012-05-08 ENCOUNTER — Ambulatory Visit
Admission: RE | Admit: 2012-05-08 | Discharge: 2012-05-08 | Disposition: A | Discharge status: Home / Self Care | Payer: Medicare Other | Source: Ambulatory Visit | Attending: Obstetrics and Gynecology | Admitting: Obstetrics and Gynecology

## 2012-05-08 DIAGNOSIS — Z9889 Other specified postprocedural states: Secondary | ICD-10-CM

## 2012-05-08 DIAGNOSIS — Z1231 Encounter for screening mammogram for malignant neoplasm of breast: Secondary | ICD-10-CM

## 2012-05-08 DIAGNOSIS — Z853 Personal history of malignant neoplasm of breast: Secondary | ICD-10-CM

## 2012-09-15 ENCOUNTER — Telehealth: Payer: Self-pay | Admitting: *Deleted

## 2012-09-15 ENCOUNTER — Other Ambulatory Visit: Payer: Self-pay | Admitting: *Deleted

## 2012-09-15 MED ORDER — EPINEPHRINE 0.3 MG/0.3ML IJ DEVI: 0.3000 mg | Freq: Once | INTRAMUSCULAR | Status: DC

## 2012-09-15 NOTE — Telephone Encounter (Signed)
Ok Thx 

## 2012-09-15 NOTE — Telephone Encounter (Signed)
Rec fax from pharmacy- pt is requesting to change Pot Cl 20 meq ER tablets to 10 meq caps (taking 2 qd) to see if she can swallow them easier. Please advise.

## 2012-09-16 MED ORDER — POTASSIUM CHLORIDE ER 10 MEQ PO CPCR: 20.0000 meq | ORAL_CAPSULE | Freq: Every day | ORAL | Status: DC

## 2012-09-16 NOTE — Telephone Encounter (Signed)
New Rx sent.

## 2012-11-22 ENCOUNTER — Emergency Department (HOSPITAL_BASED_OUTPATIENT_CLINIC_OR_DEPARTMENT_OTHER)
Admission: EM | Admit: 2012-11-22 | Discharge: 2012-11-22 | Disposition: A | Discharge status: Home / Self Care | Payer: Medicare Other | Attending: Emergency Medicine | Admitting: Emergency Medicine

## 2012-11-22 ENCOUNTER — Other Ambulatory Visit: Payer: Self-pay | Admitting: Internal Medicine

## 2012-11-22 ENCOUNTER — Emergency Department (HOSPITAL_BASED_OUTPATIENT_CLINIC_OR_DEPARTMENT_OTHER): Payer: Medicare Other

## 2012-11-22 ENCOUNTER — Encounter (HOSPITAL_BASED_OUTPATIENT_CLINIC_OR_DEPARTMENT_OTHER): Payer: Self-pay | Admitting: *Deleted

## 2012-11-22 DIAGNOSIS — R5381 Other malaise: Secondary | ICD-10-CM | POA: Insufficient documentation

## 2012-11-22 DIAGNOSIS — F3289 Other specified depressive episodes: Secondary | ICD-10-CM | POA: Insufficient documentation

## 2012-11-22 DIAGNOSIS — Z853 Personal history of malignant neoplasm of breast: Secondary | ICD-10-CM | POA: Insufficient documentation

## 2012-11-22 DIAGNOSIS — R5383 Other fatigue: Secondary | ICD-10-CM

## 2012-11-22 DIAGNOSIS — Z79899 Other long term (current) drug therapy: Secondary | ICD-10-CM | POA: Insufficient documentation

## 2012-11-22 DIAGNOSIS — Z8709 Personal history of other diseases of the respiratory system: Secondary | ICD-10-CM | POA: Insufficient documentation

## 2012-11-22 DIAGNOSIS — E538 Deficiency of other specified B group vitamins: Secondary | ICD-10-CM | POA: Insufficient documentation

## 2012-11-22 DIAGNOSIS — R110 Nausea: Secondary | ICD-10-CM | POA: Insufficient documentation

## 2012-11-22 DIAGNOSIS — E559 Vitamin D deficiency, unspecified: Secondary | ICD-10-CM | POA: Insufficient documentation

## 2012-11-22 DIAGNOSIS — K219 Gastro-esophageal reflux disease without esophagitis: Secondary | ICD-10-CM | POA: Insufficient documentation

## 2012-11-22 DIAGNOSIS — F329 Major depressive disorder, single episode, unspecified: Secondary | ICD-10-CM | POA: Insufficient documentation

## 2012-11-22 DIAGNOSIS — Z85820 Personal history of malignant melanoma of skin: Secondary | ICD-10-CM | POA: Insufficient documentation

## 2012-11-22 DIAGNOSIS — R197 Diarrhea, unspecified: Secondary | ICD-10-CM | POA: Insufficient documentation

## 2012-11-22 DIAGNOSIS — R630 Anorexia: Secondary | ICD-10-CM | POA: Insufficient documentation

## 2012-11-22 DIAGNOSIS — M549 Dorsalgia, unspecified: Secondary | ICD-10-CM

## 2012-11-22 DIAGNOSIS — G4700 Insomnia, unspecified: Secondary | ICD-10-CM | POA: Insufficient documentation

## 2012-11-22 LAB — COMPREHENSIVE METABOLIC PANEL
ALT: 17 U/L (ref 0–35)
Calcium: 9.6 mg/dL (ref 8.4–10.5)
Creatinine, Ser: 0.8 mg/dL (ref 0.50–1.10)
GFR calc Af Amer: 87 mL/min — ABNORMAL LOW (ref >90)
Glucose, Bld: 109 mg/dL — ABNORMAL HIGH (ref 70–99)
Sodium: 141 mEq/L (ref 135–145)
Total Protein: 6.8 g/dL (ref 6.0–8.3)

## 2012-11-22 LAB — CBC WITH DIFFERENTIAL/PLATELET
Eosinophils Absolute: 0.2 10*3/uL (ref 0.0–0.7)
Eosinophils Relative: 4 % (ref 0–5)
Lymphs Abs: 1.2 10*3/uL (ref 0.7–4.0)
MCH: 31.6 pg (ref 26.0–34.0)
MCV: 90.1 fL (ref 78.0–100.0)
Platelets: 155 10*3/uL (ref 150–400)
RBC: 4.43 MIL/uL (ref 3.87–5.11)
RDW: 13 % (ref 11.5–15.5)

## 2012-11-22 LAB — URINALYSIS, ROUTINE W REFLEX MICROSCOPIC
Nitrite: NEGATIVE
Specific Gravity, Urine: 1.019 (ref 1.005–1.030)
pH: 6.5 (ref 5.0–8.0)

## 2012-11-22 LAB — OCCULT BLOOD X 1 CARD TO LAB, STOOL: Fecal Occult Bld: NEGATIVE

## 2012-11-22 LAB — URINE MICROSCOPIC-ADD ON

## 2012-11-22 MED ORDER — POTASSIUM CHLORIDE CRYS ER 20 MEQ PO TBCR
40.0000 meq | EXTENDED_RELEASE_TABLET | Freq: Once | ORAL | Status: AC
  Administered 2012-11-22: 40 meq via ORAL
  Filled 2012-11-22: qty 2

## 2012-11-22 NOTE — ED Notes (Signed)
MD at bedside. 

## 2012-11-22 NOTE — ED Notes (Signed)
The patient's right arm is restricted. She had breast cancer in 1996 and then skin cancer date not told.

## 2012-11-22 NOTE — ED Provider Notes (Signed)
History    CSN: [id number] Arrival date & time 11/22/12  0740  First MD Initiated Contact with Patient 11/22/12 [phone]     Chief Complaint  Patient presents with  . multiple complaints    (Consider location/radiation/quality/duration/timing/severity/associated sxs/prior Treatment) HPI This 67 year old female has a few months of generalized fatigue generalized weakness nausea decreased appetite difficulty sleeping with insomnia with stable weight is 2 months of 2 loose stool diarrhea spells per day nonbloody without abdominal pain or vomiting she is no chest pain or shortness breath or fever but has had a cough for a couple months as well and now since yesterday has a gradual constant left upper thoracic flank pain without rash or trauma the pain is somewhat positional and nonexertional and nonpleuritic with no midline back pain no midline neck pain but the left parathoracic flank pain radiates towards her left trapezius muscle area at times but has been constant for over 12 hours with no pain to her extremities no headache no change in speech vision swallowing or understanding no focal or lateralizing weakness numbness or incoordination no trauma.  Past Medical History  Diagnosis Date  . GERD (gastroesophageal reflux disease)   . Hypertension   . Depression   . Breast cancer 1995  . Vitamin D deficiency   . Vitamin B12 deficiency   . Dyspnea     Normal Spirometry 03/2008 EF 65% BNP normal 11/2007  . 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 Mastectomy  1996    RT, no lymph nodes, chemotherapy  . Tonsillectomy    . Temporomandibular joint surgery    . Nissen fundoplication  09/2008  . Melanoma excision      From elbow-- Done at [facility] History  Problem Relation Age of Onset  . Stroke Other     F 1st degree relative 33, M 1st degree relative  . Colon cancer Cousin     Maternal Side  . Colon cancer Cousin     Paternal Side  . Colon  polyps Father    History  Substance Use Topics  . Smoking status: Never Smoker   . Smokeless tobacco: Never Used     Comment: Regular Exercise - Yes  . Alcohol Use: No   OB History   Grav Para Term Preterm Abortions TAB SAB Ect Mult Living                 Review of Systems 10 Systems reviewed and are negative for acute change except as noted in the HPI. Allergies  Ace inhibitors; Codeine; Sulfonamide derivatives; and Telmisartan  Home Medications   Current Outpatient Rx  Name  Route  Sig  Dispense  Refill  . fish oil-omega-3 fatty acids 1000 MG capsule   Oral   Take 1,400 mg by mouth daily.         Marland Kitchen acetaminophen (TYLENOL) 500 MG tablet   Oral   Take 500 mg by mouth as needed.           . Biotin (BIOTIN 5000) 5 MG CAPS   Oral   Take by mouth as directed.         . Cholecalciferol (VITAMIN D3) 1000 UNITS CAPS   Oral   Take 1 capsule by mouth daily.           . Cyanocobalamin (VITAMIN B-12 CR) 1000 MCG TBCR   Oral   Take 1 tablet by mouth  daily.           Marland Kitchen EPINEPHrine (EPIPEN 2-PAK) 0.3 mg/0.3 mL DEVI   Intramuscular   Inject 0.3 mLs (0.3 mg total) into the muscle once.   1 Device   2   . FLUoxetine (PROZAC) 10 MG capsule   Oral   Take 1 capsule (10 mg total) by mouth daily.   30 capsule   5   . furosemide (LASIX) 40 MG tablet   Oral   Take 1 tablet (40 mg total) by mouth daily.   30 tablet   11   . ibuprofen (ADVIL,MOTRIN) 200 MG tablet   Oral   Take 200 mg by mouth as needed.         Marland Kitchen omeprazole (PRILOSEC) 40 MG capsule   Oral   Take 1 capsule (40 mg total) by mouth daily.   30 capsule   11   . potassium chloride (MICRO-K) 10 MEQ CR capsule   Oral   Take 2 capsules (20 mEq total) by mouth daily.   60 capsule   5   . EXPIRED: ranitidine (ZANTAC) 300 MG tablet   Oral   Take 300 mg by mouth at bedtime.         . Red Yeast Rice 600 MG TABS   Oral   Take 1 tablet by mouth 2 (two) times daily.            BP 146/96   Pulse 74  Temp(Src) 98 F (36.7 C) (Oral)  Resp 18  Ht 5\' 9"  (1.753 m)  Wt 185 lb (83.915 kg)  BMI 27.31 kg/m2  SpO2 99% Physical Exam  Nursing note and vitals reviewed. Constitutional:  Awake, alert, nontoxic appearance with baseline speech for patient.  HENT:  Head: Atraumatic.  Mouth/Throat: No oropharyngeal exudate.  Eyes: EOM are normal. Pupils are equal, round, and reactive to light. Right eye exhibits no discharge. Left eye exhibits no discharge.  Neck: Neck supple.  Cervical spine nontender  Cardiovascular: Normal rate and regular rhythm.   No murmur heard. Pulmonary/Chest: Effort normal and breath sounds normal. No stridor. No respiratory distress. She has no wheezes. She has no rales. She exhibits no tenderness.  Abdominal: Soft. Bowel sounds are normal. She exhibits no mass. There is no tenderness. There is no rebound.  Musculoskeletal: She exhibits tenderness. She exhibits no edema.  Baseline ROM, moves extremities with no obvious new focal weakness. Minimal left parathoracic back tenderness without midline tenderness without rash  Lymphadenopathy:    She has no cervical adenopathy.  Neurological:  Awake, alert, cooperative and aware of situation; motor strength bilaterally; sensation normal to light touch bilaterally; peripheral visual fields full to confrontation; no facial asymmetry; tongue midline; major cranial nerves appear intact; no pronator drift, normal finger to nose bilaterally  Skin: No rash noted.  Psychiatric: She has a normal mood and affect.    ED Course  Procedures (including critical care time) ECG: Normal sinus rhythm, ventricular rate 60, normal axis, normal intervals, no acute ischemic changes noted, nonspecific ST abnormality, no significant change noted compared with October 2010 Patient / Family / Caregiver understand and agree with initial ED impression and plan with expectations set for ED visit. Pt stable in ED with no significant  deterioration in condition. Patient / Family / Caregiver informed of clinical course, understand medical decision-making process, and agree with plan.  Labs Reviewed  COMPREHENSIVE METABOLIC PANEL - Abnormal; Notable for the following:    Potassium 2.9 (*)  Glucose, Bld 109 (*)    GFR calc non Af Amer 75 (*)    GFR calc Af Amer 87 (*)    All other components within normal limits  URINALYSIS, ROUTINE W REFLEX MICROSCOPIC - Abnormal; Notable for the following:    Leukocytes, UA SMALL (*)    All other components within normal limits  CBC WITH DIFFERENTIAL  OCCULT BLOOD X 1 CARD TO LAB, STOOL  URINE MICROSCOPIC-ADD ON  TROPONIN I  TSH   Dg Chest 2 View  11/22/2012   *RADIOLOGY REPORT*  Clinical Data: Low back pain radiating to left shoulder.  CHEST - 2 VIEW  Comparison: None.  Findings: The heart, mediastinum and hilar contours are normal. The lungs are clear.  There are no effusions or pneumothoraces. There are no acute bony changes.  Degenerative changes history of thoracic spine.  Multiple surgical clips project in the anterior upper abdomen.  IMPRESSION: No active disease.   Original Report Authenticated By: Sander Radon, M.D.   1. Back pain   2. Fatigue     MDM  I doubt any other EMC precluding discharge at this time including, but not necessarily limited to the following:ACS.  Hurman Horn, MD 11/22/12 2200

## 2012-11-22 NOTE — ED Notes (Signed)
Patient transported to X-ray 

## 2012-11-22 NOTE — ED Notes (Addendum)
Patient here with c/o low back pain that radiates to her left shoulder, c/o nausea, cold and hot sweats, not sleeping well for the past few weeks.  Patient also c/o diarrhea for about a few weeks.  Yesterday she started having nagging cough.

## 2012-11-23 LAB — TSH: TSH: 1.688 u[IU]/mL (ref 0.350–4.500)

## 2012-12-09 ENCOUNTER — Telehealth: Payer: Self-pay | Admitting: Internal Medicine

## 2012-12-09 NOTE — Telephone Encounter (Signed)
Patient would like to transfer to [facility] due to location. Is this okay? Thanks!

## 2012-12-09 NOTE — Telephone Encounter (Signed)
OK W/me Thx

## 2012-12-15 NOTE — Telephone Encounter (Signed)
Spoke to patient and she will have to call back to schedule appointment

## 2013-01-01 ENCOUNTER — Ambulatory Visit (INDEPENDENT_AMBULATORY_CARE_PROVIDER_SITE_OTHER): Payer: Medicare Other | Admitting: Physician Assistant

## 2013-01-01 ENCOUNTER — Encounter: Payer: Self-pay | Admitting: Physician Assistant

## 2013-01-01 ENCOUNTER — Ambulatory Visit (HOSPITAL_BASED_OUTPATIENT_CLINIC_OR_DEPARTMENT_OTHER)
Admission: RE | Admit: 2013-01-01 | Discharge: 2013-01-01 | Disposition: A | Discharge status: Home / Self Care | Payer: Medicare Other | Source: Ambulatory Visit | Attending: Physician Assistant | Admitting: Physician Assistant

## 2013-01-01 VITALS — BP 132/84 | HR 75 | Temp 98.3°F | Resp 16 | Ht 67.0 in | Wt 190.5 lb

## 2013-01-01 DIAGNOSIS — M545 Low back pain, unspecified: Secondary | ICD-10-CM

## 2013-01-01 DIAGNOSIS — C4360 Malignant melanoma of unspecified upper limb, including shoulder: Secondary | ICD-10-CM

## 2013-01-01 DIAGNOSIS — G629 Polyneuropathy, unspecified: Secondary | ICD-10-CM

## 2013-01-01 DIAGNOSIS — E559 Vitamin D deficiency, unspecified: Secondary | ICD-10-CM

## 2013-01-01 DIAGNOSIS — M47817 Spondylosis without myelopathy or radiculopathy, lumbosacral region: Secondary | ICD-10-CM | POA: Insufficient documentation

## 2013-01-01 DIAGNOSIS — Z0000 Encounter for general adult medical examination without abnormal findings: Secondary | ICD-10-CM

## 2013-01-01 DIAGNOSIS — G609 Hereditary and idiopathic neuropathy, unspecified: Secondary | ICD-10-CM

## 2013-01-01 DIAGNOSIS — I10 Essential (primary) hypertension: Secondary | ICD-10-CM

## 2013-01-01 DIAGNOSIS — K219 Gastro-esophageal reflux disease without esophagitis: Secondary | ICD-10-CM

## 2013-01-01 DIAGNOSIS — C4361 Malignant melanoma of right upper limb, including shoulder: Secondary | ICD-10-CM

## 2013-01-01 DIAGNOSIS — K7689 Other specified diseases of liver: Secondary | ICD-10-CM

## 2013-01-01 HISTORY — DX: Polyneuropathy, unspecified: G62.9

## 2013-01-01 LAB — LIPID PANEL
Cholesterol: 212 mg/dL — ABNORMAL HIGH (ref 0–200)
HDL: 44 mg/dL (ref >39)
Total CHOL/HDL Ratio: 4.8 Ratio
VLDL: 22 mg/dL (ref 0–40)

## 2013-01-01 LAB — VITAMIN B12: Vitamin B-12: 883 pg/mL (ref 211–911)

## 2013-01-01 LAB — HEPATIC FUNCTION PANEL
AST: 18 U/L (ref 0–37)
Albumin: 4.2 g/dL (ref 3.5–5.2)
Total Bilirubin: 0.5 mg/dL (ref 0.3–1.2)
Total Protein: 6.6 g/dL (ref 6.0–8.3)

## 2013-01-01 LAB — BASIC METABOLIC PANEL
BUN: 14 mg/dL (ref 6–23)
CO2: 30 mEq/L (ref 19–32)
Calcium: 9.5 mg/dL (ref 8.4–10.5)
Glucose, Bld: 91 mg/dL (ref 70–99)
Potassium: 4.1 mEq/L (ref 3.5–5.3)

## 2013-01-01 LAB — CBC WITH DIFFERENTIAL/PLATELET
Basophils Relative: 1 % (ref 0–1)
Eosinophils Absolute: 0.2 10*3/uL (ref 0.0–0.7)
HCT: 41.7 % (ref 36.0–46.0)
Hemoglobin: 14.3 g/dL (ref 12.0–15.0)
Lymphs Abs: 1.4 10*3/uL (ref 0.7–4.0)
MCH: 31 pg (ref 26.0–34.0)
MCHC: 34.3 g/dL (ref 30.0–36.0)
Monocytes Absolute: 0.4 10*3/uL (ref 0.1–1.0)
Monocytes Relative: 9 % (ref 3–12)

## 2013-01-01 LAB — TSH: TSH: 2.277 u[IU]/mL (ref 0.350–4.500)

## 2013-01-01 NOTE — Assessment & Plan Note (Signed)
Patient follow-up annually with dermatology

## 2013-01-01 NOTE — Progress Notes (Addendum)
Patient ID: [name], female   DOB: 11-29-1945, 67 y.o.   MRN: [id number]   Patient is transferring care from Dr. Posey Rea.    Hypertension -- BP good at office visit.  Patient endorses checking BP at home.  Denies headache, lightheadedness or dizziness.  GERD -- S/P Nissen.  Endorses cough and symptoms of regurgitation.  Endorses some chest pain associated with symptoms.  No SOB.  Was recently seen in ED for this problem and cardiopulmonary etiology was ruled/out.  Patient takes PPI daily as prescribed.  Has not seen GI in over a year.  Depression -- Well controlled with current regimen.  No current depressive thoughts or suicidal ideation.  Patient with history of Nonalcoholic Fatty Liver disease.  Patient on fish oil supplementation.  No recent lipid panel on record.  Acute Complaints: Patient is complaining of a burning sensation in her legs for the past week.  Sensation is associated with some pain in her lower back, more pronounced on the right side.  Endorses transient numbness/tingling of right thigh.  States low back pain is worse with movement.  Endorses history of OA.  Endorses history of low B12 for which she takes a daily supplement.  Denies loss of consciousness or gait abnormality.  Endorses some transient muscle weakness of R leg.  Endorses some weight gain and increased dryness of skin.  Denies hx of hypothyroidism.  Denies hx of neurological disease.  Denies recent trauma.   Past Medical History  Diagnosis Date  . GERD (gastroesophageal reflux disease)   . Hypertension   . Depression   . Breast cancer 1995  . Vitamin D deficiency   . Vitamin B12 deficiency   . Dyspnea     Normal Spirometry 03/2008 EF 65% BNP normal 11/2007  . Melanoma 2009    Elbow   Current Outpatient Prescriptions on File Prior to Visit  Medication Sig Dispense Refill  . acetaminophen (TYLENOL) 500 MG tablet Take 500 mg by mouth as needed.        . Biotin (BIOTIN 5000) 5 MG CAPS Take by mouth  as directed.      . Cholecalciferol (VITAMIN D3) 1000 UNITS CAPS Take 1 capsule by mouth daily.        . Cyanocobalamin (VITAMIN B-12 CR) 1000 MCG TBCR Take 1 tablet by mouth daily.        Marland Kitchen EPINEPHrine (EPIPEN 2-PAK) 0.3 mg/0.3 mL DEVI Inject 0.3 mLs (0.3 mg total) into the muscle once.  1 Device  2  . fish oil-omega-3 fatty acids 1000 MG capsule Take 1,400 mg by mouth daily.      Marland Kitchen FLUoxetine (PROZAC) 10 MG capsule TAKE 1 CAPSULE BY MOUTH EVERY DAY  30 capsule  3  . furosemide (LASIX) 40 MG tablet Take 1 tablet (40 mg total) by mouth daily.  30 tablet  11  . ibuprofen (ADVIL,MOTRIN) 200 MG tablet Take 200 mg by mouth as needed.      Marland Kitchen omeprazole (PRILOSEC) 40 MG capsule Take 1 capsule (40 mg total) by mouth daily.  30 capsule  11  . potassium chloride (MICRO-K) 10 MEQ CR capsule Take 2 capsules (20 mEq total) by mouth daily.  60 capsule  5  . Red Yeast Rice 600 MG TABS Take 1 tablet by mouth 2 (two) times daily.        . [DISCONTINUED] sucralfate (CARAFATE) 1 GM/10ML suspension Take 1 g by mouth at bedtime as needed.         Current  Facility-Administered Medications on File Prior to Visit  Medication Dose Route Frequency Provider Last Rate Last Dose  . methylPREDNISolone acetate (DEPO-MEDROL) injection 40 mg  40 mg Intra-articular Once Tresa Garter, MD       Allergies  Allergen Reactions  . Ace Inhibitors     REACTION: angioedema  . Codeine   . Sulfonamide Derivatives   . Telmisartan    Family History  Problem Relation Age of Onset  . Stroke Other     F 1st degree relative 63, M 1st degree relative  . Colon cancer Cousin     Maternal Side  . Colon cancer Cousin     Paternal Side  . Colon polyps Father    History   Social History  . Marital Status: Married    Spouse Name: N/A    Number of Children: N/A  . Years of Education: N/A   Occupational History  . Manager    Social History Main Topics  . Smoking status: Never Smoker   . Smokeless tobacco: Never Used      Comment: Regular Exercise - Yes  . Alcohol Use: No  . Drug Use: No  . Sexual Activity: Yes   Other Topics Concern  . None   Social History Narrative  . None   Review of Systems  Constitutional: Positive for malaise/fatigue. Negative for fever, chills and weight loss.  HENT: Negative for hearing loss, ear pain, tinnitus and ear discharge.   Eyes: Negative for blurred vision, double vision, photophobia and pain.  Respiratory: Positive for cough. Negative for hemoptysis, sputum production, shortness of breath and wheezing.   Cardiovascular: Positive for chest pain. Negative for palpitations, orthopnea, claudication, leg swelling and PND.       Chest tenderness 2/2 chronic cough.  Workup in ED ruled out cardiovascular abnormality.  Gastrointestinal: Positive for heartburn and nausea. Negative for vomiting, abdominal pain, diarrhea, constipation, blood in stool and melena.  Genitourinary: Negative for dysuria, urgency, frequency, hematuria and flank pain.  Musculoskeletal: Positive for back pain. Negative for myalgias.  Neurological: Positive for tingling and sensory change. Negative for loss of consciousness and headaches.  Endo/Heme/Allergies: Does not bruise/bleed easily.  Psychiatric/Behavioral: Negative for depression, suicidal ideas and substance abuse.   Filed Vitals:   01/01/13 0833  BP: 132/84  Pulse: 75  Temp: 98.3 F (36.8 C)  Resp: 16    Physical Exam  Constitutional: She is oriented to person, place, and time and well-developed, well-nourished, and in no distress.  HENT:  Head: Normocephalic and atraumatic.  Right Ear: External ear normal.  Left Ear: External ear normal.  Nose: Nose normal.  Mouth/Throat: Oropharynx is clear and moist.  Eyes: Conjunctivae and EOM are normal. Pupils are equal, round, and reactive to light.  Neck: Normal range of motion. Neck supple. No thyromegaly present.  Cardiovascular: Normal rate, regular rhythm, normal heart sounds and intact  distal pulses.   Pulmonary/Chest: Effort normal and breath sounds normal. No respiratory distress. She has no wheezes. She has no rales. She exhibits no tenderness.  Abdominal: Soft. Bowel sounds are normal. She exhibits no distension and no mass. There is no tenderness. There is no rebound and no guarding.  Musculoskeletal: Normal range of motion.  No point tenderness or bony deformity noted  Lymphadenopathy:    She has no cervical adenopathy.  Neurological: She is alert and oriented to person, place, and time. No cranial nerve deficit. Gait normal. Coordination normal.  Right knee jerk reflex 1+, all other DTRs within  normal limits.  Skin: Skin is warm and dry. No rash noted.    Assessment/Plan: HYPERTENSION BP normal at today's visit.  Patient asymptomatic.  Continue to monitor BP at home.  GERD Reflux worsening even after Nissen and daily PPI therapy.  Patient to schedule appointment with her GI provider for reassessment.  LBP (low back pain) Will obtain lumbar xray today and compare with last imaging in 2012.  Patient instructed to apply moist heat to troublesome area.  Ibuprofen every 4-6 hours as needed.  Will reassess therapy once labs/imaging are obtained.  Melanoma of upper arm Patient follow-up annually with dermatology  Peripheral neuropathy Will obtain labs today to include -- B12, A1C, TSH.  Ibuprofen every 4-6 hours as needed to help with symptoms.  If symptoms persist or worsen will consider addition of gabapentin  Encounter for preventive health examination Will obtain fasting labs today.  FATTY LIVER DISEASE Will check lipid profile.

## 2013-01-01 NOTE — Assessment & Plan Note (Signed)
BP normal at today's visit.  Patient asymptomatic.  Continue to monitor BP at home.

## 2013-01-01 NOTE — Assessment & Plan Note (Signed)
Will obtain lumbar xray today and compare with last imaging in 2012.  Patient instructed to apply moist heat to troublesome area.  Ibuprofen every 4-6 hours as needed.  Will reassess therapy once labs/imaging are obtained.

## 2013-01-01 NOTE — Assessment & Plan Note (Signed)
Reflux worsening even after Nissen and daily PPI therapy.  Patient to schedule appointment with her GI provider for reassessment.

## 2013-01-01 NOTE — Assessment & Plan Note (Signed)
Will obtain labs today to include -- B12, A1C, TSH.  Ibuprofen every 4-6 hours as needed to help with symptoms.  If symptoms persist or worsen will consider addition of gabapentin

## 2013-01-01 NOTE — Assessment & Plan Note (Signed)
Will obtain fasting labs today.

## 2013-01-01 NOTE — Patient Instructions (Signed)
Ibuprofen every 4-6 hours as needed for pain relief.  Apply moist heat to troublesome areas.  Obtain labs and xray today.  Will call you with results and tailor treatment depending on cause of pain.  If this persists, you may need to see a neurologist.  Peripheral Neuropathy Peripheral neuropathy is a common disorder of your nerves resulting from damage. CAUSES  This disorder may be caused by a disease of the nerves or illness. Many neuropathies have well known causes such as:  Diabetes. This is one of the most common causes.   Uremia.   AIDS.   Nutritional deficiencies.   Other causes include mechanical pressures. These may be from:   Compression.   Injury.   Contusions or bruises.   Fracture or dislocated bones.   Pressure involving the nerves close to the surface. Nerves such as the ulnar, or radial can be injured by prolonged use of crutches.  Other injuries may come from:  Tumor.   Hemorrhage or bleeding into a nerve.   Exposure to cold or radiation.   Certain medicines or toxic substances (rare).   Vascular or collagen disorders such as:   Atherosclerosis.   Systemic lupus erythematosus.   Scleroderma.   Sarcoidosis.   Rheumatoid arthritis.   Polyarteritis nodosa.   A large number of cases are of unknown cause.  SYMPTOMS  Common problems include:  Weakness.   Numbness.   Abnormal sensations (paresthesia) such as:   Burning.   Tickling.   Pricking.   Tingling.   Pain in the arms, hands, legs and/or feet.  TREATMENT  Therapy for this disorder differs depending on the cause. It may vary from medical treatment with medications or physical therapy among others.   For example, therapy for this disorder caused by diabetes involves control of the diabetes.   In cases where a tumor or ruptured disc is the cause, therapy may involve surgery. This would be to remove the tumor or to repair the ruptured disc.   In entrapment or compression  neuropathy, treatment may consist of splinting or surgical decompression of the ulnar or median nerves. A common example of entrapment neuropathy is carpal tunnel syndrome. This has become more common because of the increasing use of computers.   Peroneal and radial compression neuropathies may require avoidance of pressure.   Physical therapy and/or splints may be useful in preventing contractures. This is a condition in which shortened muscles around joints cause abnormal and sometimes painful positioning of the joints.  Document Released: 05/03/2002 Document Revised: 01/23/2011 Document Reviewed: 05/13/2005 [facility] Patient Information 2012 [place], [place].

## 2013-01-05 LAB — VITAMIN D 1,25 DIHYDROXY: Vitamin D2 1, 25 (OH)2: <8 pg/mL

## 2013-02-12 ENCOUNTER — Ambulatory Visit (INDEPENDENT_AMBULATORY_CARE_PROVIDER_SITE_OTHER): Payer: Medicare Other | Admitting: Physician Assistant

## 2013-02-12 ENCOUNTER — Encounter: Payer: Self-pay | Admitting: Physician Assistant

## 2013-02-12 VITALS — BP 126/86 | HR 75 | Temp 98.3°F | Resp 16 | Ht 67.0 in | Wt 189.8 lb

## 2013-02-12 DIAGNOSIS — J209 Acute bronchitis, unspecified: Secondary | ICD-10-CM

## 2013-02-12 LAB — CBC WITH DIFFERENTIAL/PLATELET
Eosinophils Relative: 4 % (ref 0–5)
Hemoglobin: 13.7 g/dL (ref 12.0–15.0)
Lymphocytes Relative: 27 % (ref 12–46)
Lymphs Abs: 1.8 10*3/uL (ref 0.7–4.0)
MCV: 90.4 fL (ref 78.0–100.0)
Monocytes Relative: 8 % (ref 3–12)
Neutrophils Relative %: 60 % (ref 43–77)
Platelets: 229 10*3/uL (ref 150–400)
RBC: 4.36 MIL/uL (ref 3.87–5.11)
WBC: 6.7 10*3/uL (ref 4.0–10.5)

## 2013-02-12 LAB — BASIC METABOLIC PANEL
CO2: 32 mEq/L (ref 19–32)
Chloride: 107 mEq/L (ref 96–112)
Glucose, Bld: 77 mg/dL (ref 70–99)
Sodium: 142 mEq/L (ref 135–145)

## 2013-02-12 MED ORDER — AZITHROMYCIN 250 MG PO TABS: ORAL_TABLET | ORAL | Status: DC

## 2013-02-12 NOTE — Patient Instructions (Addendum)
Please obtain labs.  I will call you with the results.  Increase water intake.  Get plenty of rest.  Take antibiotics as prescribed until all pills are gone.  Take a daily probiotic.  Mucinex for congestion.  Avoid Mucinex D as it can increase your blood pressure.  A zinc supplement may be beneficial.

## 2013-02-14 DIAGNOSIS — J209 Acute bronchitis, unspecified: Secondary | ICD-10-CM | POA: Insufficient documentation

## 2013-02-14 NOTE — Progress Notes (Signed)
Patient ID: [name], female   DOB: 1945/10/26, 67 y.o.   MRN: [id number]  Patient presents to clinic today c/o cough sometimes productive, with fever and fatigue that has been going on for the past 3 weeks.  Patient states she originally had a non-productive cough that lasted for a week before improving.  Thought she was better then developed nasal congestion, productive cough and felt feverish since 1.5 weeks ago.  Denies sinus pressure, pain or headache.  Denies N/V/C/D or rash.  Denies chest pain, shortness of breath or wheezing.  Past Medical History  Diagnosis Date  . GERD (gastroesophageal reflux disease)   . Hypertension   . Depression   . Breast cancer 1995  . Vitamin D deficiency   . Vitamin B12 deficiency   . Dyspnea     Normal Spirometry 03/2008 EF 65% BNP normal 11/2007  . 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 acetaminophen (TYLENOL) 500 MG tablet Take 500 mg by mouth as needed.        . Biotin (BIOTIN 5000) 5 MG CAPS Take by mouth as directed.      . Cholecalciferol (VITAMIN D3) 1000 UNITS CAPS Take 1 capsule by mouth daily.        . Cyanocobalamin (VITAMIN B-12 CR) 1000 MCG TBCR Take 1 tablet by mouth daily.        Marland Kitchen EPINEPHrine (EPIPEN 2-PAK) 0.3 mg/0.3 mL DEVI Inject 0.3 mLs (0.3 mg total) into the muscle once.  1 Device  2  . fish oil-omega-3 fatty acids 1000 MG capsule Take 1,400 mg by mouth daily.      Marland Kitchen FLUoxetine (PROZAC) 10 MG capsule TAKE 1 CAPSULE BY MOUTH EVERY DAY  30 capsule  3  . furosemide (LASIX) 40 MG tablet Take 1 tablet (40 mg total) by mouth daily.  30 tablet  11  . ibuprofen (ADVIL,MOTRIN) 200 MG tablet Take 200 mg by mouth as needed.      Marland Kitchen omeprazole (PRILOSEC) 40 MG capsule Take 1 capsule (40 mg total) by mouth daily.  30 capsule  11  . potassium chloride (MICRO-K) 10 MEQ CR capsule Take 2 capsules (20 mEq total) by mouth daily.  60 capsule  5  . Red Yeast Rice 600 MG  TABS Take 1 tablet by mouth 2 (two) times daily.        . [DISCONTINUED] sucralfate (CARAFATE) 1 GM/10ML suspension Take 1 g by mouth at bedtime as needed.         Current Facility-Administered Medications on File Prior to Visit  Medication Dose Route Frequency Provider Last Rate Last Dose  . methylPREDNISolone acetate (DEPO-MEDROL) injection 40 mg  40 mg Intra-articular Once Tresa Garter, MD        Allergies  Allergen Reactions  . Ace Inhibitors     REACTION: angioedema  . Codeine   . Sulfonamide Derivatives   . Telmisartan     Family History  Problem Relation Age of Onset  . Stroke Other     F 1st degree relative 45, M 1st degree relative  . Colon cancer Cousin     Maternal Side  . Colon cancer Cousin     Paternal Side  . Colon polyps Father     History   Social History  . Marital Status: Married    Spouse Name: N/A    Number of Children: N/A  . Years of  Education: N/A   Occupational History  . Manager    Social History Main Topics  . Smoking status: Never Smoker   . Smokeless tobacco: Never Used     Comment: Regular Exercise - Yes  . Alcohol Use: No  . Drug Use: No  . Sexual Activity: Yes   Other Topics Concern  . None   Social History Narrative  . None   ROS See HPI  Filed Vitals:   02/12/13 0859  BP: 126/86  Pulse: 75  Temp: 98.3 F (36.8 C)  Resp: 16   Physical Exam  Vitals reviewed. Constitutional: She is oriented to person, place, and time and well-developed, well-nourished, and in no distress.  HENT:  Head: Normocephalic and atraumatic.  Right Ear: External ear normal.  Left Ear: External ear normal.  Nose: Nose normal.  Mouth/Throat: Oropharynx is clear and moist. No oropharyngeal exudate.  TM WNL bilaterally  Eyes: Conjunctivae are normal.  Neck: Neck supple.  Cardiovascular: Normal rate, regular rhythm, normal heart sounds and intact distal pulses.   Pulmonary/Chest: Effort normal and breath sounds normal.   Lymphadenopathy:    She has no cervical adenopathy.  Neurological: She is oriented to person, place, and time.  Skin: Skin is warm and dry. No rash noted.     Recent Results (from the past 2160 hour(s))  OCCULT BLOOD X 1 CARD TO LAB, STOOL     Status: None   Collection Time    11/22/12  8:10 AM      Result Value Range   Fecal Occult Bld NEGATIVE  NEGATIVE  CBC WITH DIFFERENTIAL     Status: None   Collection Time    11/22/12  8:25 AM      Result Value Range   WBC 4.9  4.0 - 10.5 K/uL   RBC 4.43  3.87 - 5.11 MIL/uL   Hemoglobin 14.0  12.0 - 15.0 g/dL   HCT 16.1  09.6 - 04.5 %   MCV 90.1  78.0 - 100.0 fL   MCH 31.6  26.0 - 34.0 pg   MCHC 35.1  30.0 - 36.0 g/dL   RDW 40.9  81.1 - 91.4 %   Platelets 155  150 - 400 K/uL   Neutrophils Relative % 59  43 - 77 %   Neutro Abs 2.9  1.7 - 7.7 K/uL   Lymphocytes Relative 25  12 - 46 %   Lymphs Abs 1.2  0.7 - 4.0 K/uL   Monocytes Relative 11  3 - 12 %   Monocytes Absolute 0.5  0.1 - 1.0 K/uL   Eosinophils Relative 4  0 - 5 %   Eosinophils Absolute 0.2  0.0 - 0.7 K/uL   Basophils Relative 1  0 - 1 %   Basophils Absolute 0.0  0.0 - 0.1 K/uL  COMPREHENSIVE METABOLIC PANEL     Status: Abnormal   Collection Time    11/22/12  8:25 AM      Result Value Range   Sodium 141  135 - 145 mEq/L   Potassium 2.9 (*) 3.5 - 5.1 mEq/L   Chloride 101  96 - 112 mEq/L   CO2 31  19 - 32 mEq/L   Glucose, Bld 109 (*) 70 - 99 mg/dL   BUN 17  6 - 23 mg/dL   Creatinine, Ser 7.82  0.50 - 1.10 mg/dL   Calcium 9.6  8.4 - 95.6 mg/dL   Total Protein 6.8  6.0 - 8.3 g/dL   Albumin 3.8  3.5 - 5.2 g/dL   AST 21  0 - 37 U/L   ALT 17  0 - 35 U/L   Alkaline Phosphatase 77  39 - 117 U/L   Total Bilirubin 0.4  0.3 - 1.2 mg/dL   GFR calc non Af Amer 75 (*) >90 mL/min   GFR calc Af Amer 87 (*) >90 mL/min   Comment:            The eGFR has been calculated     using the CKD EPI equation.     This calculation has not been     validated in all clinical      situations.     eGFR's persistently     <90 mL/min signify     possible Chronic Kidney Disease.  TSH     Status: None   Collection Time    11/22/12  8:25 AM      Result Value Range   TSH 1.688  0.350 - 4.500 uIU/mL  TROPONIN I     Status: None   Collection Time    11/22/12  8:25 AM      Result Value Range   Troponin I <0.30  <0.30 ng/mL   Comment:            Due to the release kinetics of cTnI,     a negative result within the first hours     of the onset of symptoms does not rule out     myocardial infarction with certainty.     If myocardial infarction is still suspected,     repeat the test at appropriate intervals.  URINALYSIS, ROUTINE W REFLEX MICROSCOPIC     Status: Abnormal   Collection Time    11/22/12  8:36 AM      Result Value Range   Color, Urine YELLOW  YELLOW   APPearance CLEAR  CLEAR   Specific Gravity, Urine 1.019  1.005 - 1.030   pH 6.5  5.0 - 8.0   Glucose, UA NEGATIVE  NEGATIVE mg/dL   Hgb urine dipstick NEGATIVE  NEGATIVE   Bilirubin Urine NEGATIVE  NEGATIVE   Ketones, ur NEGATIVE  NEGATIVE mg/dL   Protein, ur NEGATIVE  NEGATIVE mg/dL   Urobilinogen, UA 1.0  0.0 - 1.0 mg/dL   Nitrite NEGATIVE  NEGATIVE   Leukocytes, UA SMALL (*) NEGATIVE  URINE MICROSCOPIC-ADD ON     Status: None   Collection Time    11/22/12  8:36 AM      Result Value Range   Squamous Epithelial / LPF RARE  RARE   WBC, UA 0-2  <3 WBC/hpf   Bacteria, UA RARE  RARE   Urine-Other MUCOUS PRESENT    CBC WITH DIFFERENTIAL     Status: None   Collection Time    01/01/13  9:13 AM      Result Value Range   WBC 5.1  4.0 - 10.5 K/uL   RBC 4.61  3.87 - 5.11 MIL/uL   Hemoglobin 14.3  12.0 - 15.0 g/dL   HCT 16.1  09.6 - 04.5 %   MCV 90.5  78.0 - 100.0 fL   MCH 31.0  26.0 - 34.0 pg   MCHC 34.3  30.0 - 36.0 g/dL   RDW 40.9  81.1 - 91.4 %   Platelets 197  150 - 400 K/uL   Neutrophils Relative % 59  43 - 77 %   Neutro Abs 3.0  1.7 - 7.7 K/uL   Lymphocytes Relative 27  12 - 46 %   Lymphs  Abs 1.4  0.7 - 4.0 K/uL   Monocytes Relative 9  3 - 12 %   Monocytes Absolute 0.4  0.1 - 1.0 K/uL   Eosinophils Relative 4  0 - 5 %   Eosinophils Absolute 0.2  0.0 - 0.7 K/uL   Basophils Relative 1  0 - 1 %   Basophils Absolute 0.0  0.0 - 0.1 K/uL   Smear Review Criteria for review not met    BASIC METABOLIC PANEL     Status: None   Collection Time    01/01/13  9:13 AM      Result Value Range   Sodium 142  135 - 145 mEq/L   Potassium 4.1  3.5 - 5.3 mEq/L   Chloride 104  96 - 112 mEq/L   CO2 30  19 - 32 mEq/L   Glucose, Bld 91  70 - 99 mg/dL   BUN 14  6 - 23 mg/dL   Creat 1.61  0.96 - 0.45 mg/dL   Calcium 9.5  8.4 - 40.9 mg/dL  HEPATIC FUNCTION PANEL     Status: None   Collection Time    01/01/13  9:13 AM      Result Value Range   Total Bilirubin 0.5  0.3 - 1.2 mg/dL   Bilirubin, Direct 0.1  0.0 - 0.3 mg/dL   Indirect Bilirubin 0.4  0.0 - 0.9 mg/dL   Alkaline Phosphatase 73  39 - 117 U/L   AST 18  0 - 37 U/L   ALT 15  0 - 35 U/L   Total Protein 6.6  6.0 - 8.3 g/dL   Albumin 4.2  3.5 - 5.2 g/dL  TSH     Status: None   Collection Time    01/01/13  9:13 AM      Result Value Range   TSH 2.277  0.350 - 4.500 uIU/mL  HEMOGLOBIN A1C     Status: None   Collection Time    01/01/13  9:13 AM      Result Value Range   Hemoglobin A1C 5.5  <5.7 %   Comment:                                                                            According to the ADA Clinical Practice Recommendations for 2011, when     HbA1c is used as a screening test:             >=6.5%   Diagnostic of Diabetes Mellitus                (if abnormal result is confirmed)           5.7-6.4%   Increased risk of developing Diabetes Mellitus           References:Diagnosis and Classification of Diabetes Mellitus,Diabetes     Care,2011,34(Suppl 1):S62-S69 and Standards of Medical Care in             Diabetes - 2011,Diabetes Care,2011,34 (Suppl 1):S11-S61.         Mean Plasma Glucose 111  <117 mg/dL  VITAMIN W11      Status: None   Collection Time  01/01/13  9:13 AM      Result Value Range   Vitamin B-12 883  211 - 911 pg/mL  VITAMIN D 1,25 DIHYDROXY     Status: None   Collection Time    01/01/13  9:13 AM      Result Value Range   Vitamin D 1, 25 (OH) Total 63  18 - 72 pg/mL   Vitamin D3 1, 25 (OH) 63     Vitamin D2 1, 25 (OH) <8     Comment: Vitamin D3, 1,25(OH)2 indicates both endogenous     production and supplementation.  Vitamin D2, 1,25(OH)2     is an indicator of exogeous sources, such as diet or     supplementation.  Interpretation and therapy are based     on measurement of Vitamin D,1,25(OH)2, Total.     This test was developed and its performance     characteristics have been determined by [facility], [place], [place].     Performance characteristics refer to the     analytical performance of the test.  LIPID PANEL     Status: Abnormal   Collection Time    01/01/13  9:13 AM      Result Value Range   Cholesterol 212 (*) 0 - 200 mg/dL   Comment: ATP III Classification:           < 200        mg/dL        Desirable          200 - 239     mg/dL        Borderline High          >= 240        mg/dL        High         Triglycerides 111  <150 mg/dL   HDL 44  >40 mg/dL   Total CHOL/HDL Ratio 4.8     VLDL 22  0 - 40 mg/dL   LDL Cholesterol 981 (*) 0 - 99 mg/dL   Comment:       Total Cholesterol/HDL Ratio:CHD Risk                            Coronary Heart Disease Risk Table                                            Men       Women              1/2 Average Risk              3.4        3.3                  Average Risk              5.0        4.4               2X Average Risk              9.6        7.1               3X Average Risk  23.4       11.0     Use the calculated Patient Ratio above and the CHD Risk table      to determine the patient's CHD Risk.     ATP III Classification (LDL):           < 100        mg/dL         Optimal           100 - 129     mg/dL         Near or Above Optimal          130 - 159     mg/dL         Borderline High          160 - 189     mg/dL         High           > 190        mg/dL         Very High        CBC WITH DIFFERENTIAL     Status: None   Collection Time    02/12/13  9:49 AM      Result Value Range   WBC 6.7  4.0 - 10.5 K/uL   RBC 4.36  3.87 - 5.11 MIL/uL   Hemoglobin 13.7  12.0 - 15.0 g/dL   HCT 16.1  09.6 - 04.5 %   MCV 90.4  78.0 - 100.0 fL   MCH 31.4  26.0 - 34.0 pg   MCHC 34.8  30.0 - 36.0 g/dL   RDW 40.9  81.1 - 91.4 %   Platelets 229  150 - 400 K/uL   Neutrophils Relative % 60  43 - 77 %   Neutro Abs 4.0  1.7 - 7.7 K/uL   Lymphocytes Relative 27  12 - 46 %   Lymphs Abs 1.8  0.7 - 4.0 K/uL   Monocytes Relative 8  3 - 12 %   Monocytes Absolute 0.5  0.1 - 1.0 K/uL   Eosinophils Relative 4  0 - 5 %   Eosinophils Absolute 0.2  0.0 - 0.7 K/uL   Basophils Relative 1  0 - 1 %   Basophils Absolute 0.1  0.0 - 0.1 K/uL   Smear Review Criteria for review not met    BASIC METABOLIC PANEL     Status: None   Collection Time    02/12/13  9:49 AM      Result Value Range   Sodium 142  135 - 145 mEq/L   Potassium 3.9  3.5 - 5.3 mEq/L   Chloride 107  96 - 112 mEq/L   CO2 32  19 - 32 mEq/L   Glucose, Bld 77  70 - 99 mg/dL   BUN 16  6 - 23 mg/dL   Creat 7.82  9.56 - 2.13 mg/dL   Calcium 9.0  8.4 - 08.6 mg/dL    Assessment/Plan: No problem-specific assessment & plan notes found for this encounter.

## 2013-02-14 NOTE — Assessment & Plan Note (Signed)
Rx Azithromycin given length of illness.  PE WNL.  O2 sats 98%.  Rest, fluids, saline nasal spray, daily probiotic, zinc supplement.

## 2013-02-19 NOTE — Assessment & Plan Note (Signed)
Will check lipid profile

## 2013-02-19 NOTE — Addendum Note (Signed)
Addended by: Marcelline Mates on: 02/19/2013 09:21 AM   Modules accepted: Level of Service

## 2013-03-15 ENCOUNTER — Telehealth: Payer: Self-pay

## 2013-03-15 ENCOUNTER — Other Ambulatory Visit: Payer: Self-pay

## 2013-03-15 MED ORDER — FUROSEMIDE 40 MG PO TABS: 40.0000 mg | ORAL_TABLET | Freq: Every day | ORAL | Status: DC

## 2013-03-15 MED ORDER — FLUOXETINE HCL 10 MG PO CAPS: ORAL_CAPSULE | ORAL | Status: DC

## 2013-03-15 MED ORDER — OMEPRAZOLE 40 MG PO CPDR: 40.0000 mg | DELAYED_RELEASE_CAPSULE | Freq: Every day | ORAL | Status: DC

## 2013-03-15 NOTE — Telephone Encounter (Signed)
Received a call and pt states she received a few statements from [facility] stating pt would be sent to collections on Nov. 7th if the bill had not been paid.  Pt states she has called her insurance company twice.  The first time she spoke with Mikle Bosworth in Sept, 2014, who told pt that more medical back up was needed and he was going to request it.  Then pt called and spoke with Abby and states it was denied and that the insurance will not pay for it and it may need to be recoded and resubmitted. Pt spoke with [facility] as well and was told not to pay the bill.  Pt is requesting this be recoded so that her insurance will pay for it.  The two test not covered are the lipid panel which is 55.00 and the A1C which is 49.00. Pls advise.

## 2013-03-15 NOTE — Telephone Encounter (Signed)
Received a call from pt wanting to have prozac 10 mg, omeprazole 40 mg, and furosemide 40 mg sent to [facility] [place].  Pt called the office bc she switched doctors and did not want the rx sent previous PCP. Ok per Malva Cogan to send rx to pharmacy.

## 2013-03-17 NOTE — Telephone Encounter (Signed)
Spoke with Katrina in lab; will contact [facility] and see if these labs can be recoded/SLS

## 2013-04-05 ENCOUNTER — Encounter: Payer: Self-pay | Admitting: Physician Assistant

## 2013-04-05 ENCOUNTER — Ambulatory Visit (INDEPENDENT_AMBULATORY_CARE_PROVIDER_SITE_OTHER): Payer: Medicare Other | Admitting: Physician Assistant

## 2013-04-05 ENCOUNTER — Ambulatory Visit (HOSPITAL_BASED_OUTPATIENT_CLINIC_OR_DEPARTMENT_OTHER)
Admission: RE | Admit: 2013-04-05 | Discharge: 2013-04-05 | Disposition: A | Discharge status: Home / Self Care | Payer: Medicare Other | Source: Ambulatory Visit | Attending: Physician Assistant | Admitting: Physician Assistant

## 2013-04-05 ENCOUNTER — Telehealth: Payer: Self-pay | Admitting: Physician Assistant

## 2013-04-05 VITALS — BP 136/90 | HR 68 | Temp 98.3°F | Ht 67.0 in | Wt 192.5 lb

## 2013-04-05 DIAGNOSIS — H53489 Generalized contraction of visual field, unspecified eye: Secondary | ICD-10-CM | POA: Insufficient documentation

## 2013-04-05 DIAGNOSIS — G43509 Persistent migraine aura without cerebral infarction, not intractable, without status migrainosus: Secondary | ICD-10-CM

## 2013-04-05 LAB — BASIC METABOLIC PANEL
BUN: 14 mg/dL (ref 6–23)
Calcium: 9.1 mg/dL (ref 8.4–10.5)
Creat: 0.81 mg/dL (ref 0.50–1.10)
Glucose, Bld: 88 mg/dL (ref 70–99)

## 2013-04-05 LAB — CBC WITH DIFFERENTIAL/PLATELET
Basophils Relative: 1 % (ref 0–1)
Eosinophils Absolute: 0.2 10*3/uL (ref 0.0–0.7)
Eosinophils Relative: 3 % (ref 0–5)
HCT: 40.1 % (ref 36.0–46.0)
Hemoglobin: 14 g/dL (ref 12.0–15.0)
MCH: 31.7 pg (ref 26.0–34.0)
MCHC: 34.9 g/dL (ref 30.0–36.0)
Monocytes Absolute: 0.5 10*3/uL (ref 0.1–1.0)
Monocytes Relative: 8 % (ref 3–12)

## 2013-04-05 LAB — SEDIMENTATION RATE: Sed Rate: 12 mm/hr (ref 0–22)

## 2013-04-05 NOTE — Assessment & Plan Note (Signed)
Patient delines Triptan therapy due to previous reaction.  Encourage use of excedrin migraine.  Daily claritin.  Avoid high salt intake.  Will obtain Ct head to rule out mass.  Will also obtain CBC, BMP.

## 2013-04-05 NOTE — Patient Instructions (Signed)
Please take excedrin migraine for headache.  Take a daily claritin.  Monitor your diet, being mindful of salt intake.  Please obtain labs and imaging.  I will call you with your results.

## 2013-04-05 NOTE — Progress Notes (Signed)
Patient ID: [name], female   DOB: 1945-10-24, 67 y.o.   MRN: [id number]  Patient presents to clinic today c/o migraine with tunnel vision that has been present intermittently over the past 2 weeks.  Onset of headache was associated with nausea.  Patient states she took some excedrin migraine and the headache went away.  Has endorsed intermittent auras associated with phonophobia and tunnel vision.  Endorses occasional blurry vision during auras.  Denies ear pain, tinnitus, vertigo.  Denies hx of meniere's disease.  Follows a low-0salt diet.  Patient denies recent change in medication.  Denies chest pain, palpitations, LH or syncope.  Denies recent URI or symptoms.  Denies sinus pressure or sinus pain.   Past Medical History  Diagnosis Date  . GERD (gastroesophageal reflux disease)   . Hypertension   . Depression   . Breast cancer 1995  . Vitamin D deficiency   . Vitamin B12 deficiency   . Dyspnea     Normal Spirometry 03/2008 EF 65% BNP normal 11/2007  . 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 acetaminophen (TYLENOL) 500 MG tablet Take 500 mg by mouth as needed.        Marland Kitchen azithromycin (ZITHROMAX) 250 MG tablet Take 2 tablet today, then 1 tablet each day after  6 tablet  0  . Biotin (BIOTIN 5000) 5 MG CAPS Take by mouth as directed.      . Cholecalciferol (VITAMIN D3) 1000 UNITS CAPS Take 1 capsule by mouth daily.        . Cyanocobalamin (VITAMIN B-12 CR) 1000 MCG TBCR Take 1 tablet by mouth daily.        Marland Kitchen EPINEPHrine (EPIPEN 2-PAK) 0.3 mg/0.3 mL DEVI Inject 0.3 mLs (0.3 mg total) into the muscle once.  1 Device  2  . fish oil-omega-3 fatty acids 1000 MG capsule Take 1,400 mg by mouth daily.      Marland Kitchen FLUoxetine (PROZAC) 10 MG capsule TAKE 1 CAPSULE BY MOUTH EVERY DAY  90 capsule  1  . furosemide (LASIX) 40 MG tablet Take 1 tablet (40 mg total) by mouth daily.  90 tablet  1  . ibuprofen (ADVIL,MOTRIN) 200 MG  tablet Take 200 mg by mouth as needed.      Marland Kitchen omeprazole (PRILOSEC) 40 MG capsule Take 1 capsule (40 mg total) by mouth daily.  90 capsule  3  . potassium chloride (MICRO-K) 10 MEQ CR capsule Take 2 capsules (20 mEq total) by mouth daily.  60 capsule  5  . Red Yeast Rice 600 MG TABS Take 1 tablet by mouth 2 (two) times daily.        . [DISCONTINUED] sucralfate (CARAFATE) 1 GM/10ML suspension Take 1 g by mouth at bedtime as needed.         Current Facility-Administered Medications on File Prior to Visit  Medication Dose Route Frequency Provider Last Rate Last Dose  . methylPREDNISolone acetate (DEPO-MEDROL) injection 40 mg  40 mg Intra-articular Once Tresa Garter, MD        Allergies  Allergen Reactions  . Ace Inhibitors     REACTION: angioedema  . Codeine   . Sulfonamide Derivatives   . Telmisartan     Family History  Problem Relation Age of Onset  . Stroke Other     F 1st degree relative 18, M 1st degree relative  . Colon cancer Cousin  Maternal Side  . Colon cancer Cousin     Paternal Side  . Colon polyps Father     History   Social History  . Marital Status: Married    Spouse Name: N/A    Number of Children: N/A  . Years of Education: N/A   Occupational History  . Manager    Social History Main Topics  . Smoking status: Never Smoker   . Smokeless tobacco: Never Used     Comment: Regular Exercise - Yes  . Alcohol Use: No  . Drug Use: No  . Sexual Activity: Yes   Other Topics Concern  . None   Social History Narrative  . None    Review of Systems - See HPI.  All other ROS are negative.   Filed Vitals:   04/05/13 1100  BP: 136/90  Pulse: 68  Temp: 98.3 F (36.8 C)   Physical Exam  Vitals reviewed. Constitutional: She is oriented to person, place, and time and well-developed, well-nourished, and in no distress.  HENT:  Head: Normocephalic and atraumatic.  Right Ear: External ear normal.  Left Ear: External ear normal.  Nose: Nose  normal.  Mouth/Throat: Oropharynx is clear and moist. No oropharyngeal exudate.  TM within normal limits bilaterally   Eyes: Conjunctivae and EOM are normal. Pupils are equal, round, and reactive to light.  Neck: Neck supple.  Cardiovascular: Normal rate, regular rhythm, normal heart sounds and intact distal pulses.   Pulmonary/Chest: Effort normal and breath sounds normal. No respiratory distress. She has no wheezes. She has no rales. She exhibits no tenderness.  Lymphadenopathy:    She has no cervical adenopathy.  Neurological: She is alert and oriented to person, place, and time. No cranial nerve deficit. Coordination normal.  Skin: Skin is warm and dry. No rash noted.  Psychiatric: Affect normal.   Recent Results (from the past 2160 hour(s))  CBC WITH DIFFERENTIAL     Status: None   Collection Time    02/12/13  9:49 AM      Result Value Range   WBC 6.7  4.0 - 10.5 K/uL   RBC 4.36  3.87 - 5.11 MIL/uL   Hemoglobin 13.7  12.0 - 15.0 g/dL   HCT 09.8  11.9 - 14.7 %   MCV 90.4  78.0 - 100.0 fL   MCH 31.4  26.0 - 34.0 pg   MCHC 34.8  30.0 - 36.0 g/dL   RDW 82.9  56.2 - 13.0 %   Platelets 229  150 - 400 K/uL   Neutrophils Relative % 60  43 - 77 %   Neutro Abs 4.0  1.7 - 7.7 K/uL   Lymphocytes Relative 27  12 - 46 %   Lymphs Abs 1.8  0.7 - 4.0 K/uL   Monocytes Relative 8  3 - 12 %   Monocytes Absolute 0.5  0.1 - 1.0 K/uL   Eosinophils Relative 4  0 - 5 %   Eosinophils Absolute 0.2  0.0 - 0.7 K/uL   Basophils Relative 1  0 - 1 %   Basophils Absolute 0.1  0.0 - 0.1 K/uL   Smear Review Criteria for review not met    BASIC METABOLIC PANEL     Status: None   Collection Time    02/12/13  9:49 AM      Result Value Range   Sodium 142  135 - 145 mEq/L   Potassium 3.9  3.5 - 5.3 mEq/L   Chloride 107  96 -  112 mEq/L   CO2 32  19 - 32 mEq/L   Glucose, Bld 77  70 - 99 mg/dL   BUN 16  6 - 23 mg/dL   Creat 4.09  8.11 - 9.14 mg/dL   Calcium 9.0  8.4 - 78.2 mg/dL     Assessment/Plan: Migraine aura, persistent Patient delines Triptan therapy due to previous reaction.  Encourage use of excedrin migraine.  Daily claritin.  Avoid high salt intake.  Will obtain Ct head to rule out mass.  Will also obtain CBC, BMP.

## 2013-04-05 NOTE — Telephone Encounter (Signed)
LMOM with return number to discuss results with patient.  CT Head within normal limits.  Labs pending.

## 2013-04-05 NOTE — Progress Notes (Signed)
Pre visit review using our clinic review tool, if applicable. No additional management support is needed unless otherwise documented below in the visit note. 

## 2013-04-06 ENCOUNTER — Telehealth: Payer: Self-pay

## 2013-04-06 NOTE — Telephone Encounter (Signed)
Patient informed of results.  

## 2013-04-06 NOTE — Telephone Encounter (Signed)
Message copied by Eulis Manly on Tue Apr 06, 2013  8:30 AM ------      Message from: Marcelline Mates      Created: Tue Apr 06, 2013  6:54 AM       Please inform patient that her labs look good.  No sign of infection or electrolyte disturbance.  She should continue care as discussed at last visit.  If her symptoms are not improving, she should return to clinic. ------

## 2013-04-28 ENCOUNTER — Telehealth: Payer: Self-pay | Admitting: Physician Assistant

## 2013-04-28 DIAGNOSIS — R937 Abnormal findings on diagnostic imaging of other parts of musculoskeletal system: Secondary | ICD-10-CM

## 2013-04-28 NOTE — Telephone Encounter (Signed)
Pt reports Left foot Fx treated by Dr. Ralene Cork at [facility], will call and request those records faxed to our office; Insurance letter noted & faxed; bone density scheduled for Mon, 12.08.14/SLS

## 2013-04-28 NOTE — Telephone Encounter (Signed)
Please inform patient that she is due to have a bone density screening giving she is over 67 years of age, female, and has no record of previous density.  She is at increased risk of osteoporosis. Also received a letter from her insurance stating she also needs the scan because she sustained a fracture on 01/12/2013.  This is not documented in our system.  I am placing an order for bone density scan.  She will be contacted to set up a day/time for this imaging/

## 2013-05-03 ENCOUNTER — Ambulatory Visit (INDEPENDENT_AMBULATORY_CARE_PROVIDER_SITE_OTHER)
Admission: RE | Admit: 2013-05-03 | Discharge: 2013-05-03 | Disposition: A | Discharge status: Home / Self Care | Payer: Medicare Other | Source: Ambulatory Visit | Attending: Physician Assistant | Admitting: Physician Assistant

## 2013-05-03 ENCOUNTER — Other Ambulatory Visit: Payer: Self-pay | Admitting: Physician Assistant

## 2013-05-03 ENCOUNTER — Telehealth: Payer: Self-pay | Admitting: Physician Assistant

## 2013-05-03 DIAGNOSIS — Z299 Encounter for prophylactic measures, unspecified: Secondary | ICD-10-CM

## 2013-05-03 NOTE — Telephone Encounter (Signed)
Call from [facility].  Patient presented for DEXA scan.  Order not visible in system to performing technician.  Order placed again.

## 2013-05-18 ENCOUNTER — Telehealth: Payer: Self-pay | Admitting: *Deleted

## 2013-05-18 DIAGNOSIS — M8580 Other specified disorders of bone density and structure, unspecified site: Secondary | ICD-10-CM

## 2013-05-18 NOTE — Telephone Encounter (Signed)
Message copied by Regis Bill on Tue May 18, 2013  3:07 PM ------      Message from: Marcelline Mates      Created: Thu May 13, 2013  5:18 PM       Please inform patient that her bone density scan showed osteopenia, but no osteoporosis.  I would like for her to return to clinic for a vitamin-d blood level.  I would also like for her to exercise several times per week, including upper-body exercises with 2-5lb weights.  This will help prevent fractures and bone weakening.  Will also start a vitamin D supplement once lab results are in. ------

## 2013-05-18 NOTE — Telephone Encounter (Signed)
Patient informed, understood & agreed; lab order for Vit D placed/SLS

## 2013-05-27 LAB — HM PAP SMEAR

## 2013-05-27 LAB — VITAMIN D 25 HYDROXY (VIT D DEFICIENCY, FRACTURES): Vit D, 25-Hydroxy: 39 ng/mL (ref 30–89)

## 2013-08-19 ENCOUNTER — Telehealth: Payer: Self-pay | Admitting: *Deleted

## 2013-08-19 NOTE — Telephone Encounter (Signed)
Patient called and left message inquiring if she has received an influenza vaccination this year and a shingles vaccine. These immunizations both need to be updated. Called and unable to leave message for patient. JG//[id number]

## 2013-11-08 ENCOUNTER — Other Ambulatory Visit: Payer: Self-pay | Admitting: Physician Assistant

## 2013-11-08 DIAGNOSIS — F419 Anxiety disorder, unspecified: Secondary | ICD-10-CM

## 2013-11-08 MED ORDER — FLUOXETINE HCL 10 MG PO CAPS: ORAL_CAPSULE | ORAL | Status: DC

## 2013-11-08 NOTE — Telephone Encounter (Signed)
Please call patient and schedule F/U visit needed prior to future med refills/SLS Medications:     Approved        Disp Refills Start End      FLUoxetine (PROZAC) 10 MG capsule 30 capsule 1 11/08/2013        Sig:  TAKE 1 CAPSULE BY MOUTH EVERY DAY      Class:  Normal      Comment:  No further refills without appointment.      Authorizing Provider:  Leeanne Rio, PA-C         Call Documentation      Rockwell Germany, CMA at 11/08/2013 11:54 AM      Status: Signed            Refill request for Fluoxetine Last filled by MD on - 10.20.14. #90x1 Last AEX - New Pt 08.08.14; Acute visit 11.10.14 Next AEX - No future appointment scheduled Please Advise on refills/SLS

## 2013-11-08 NOTE — Telephone Encounter (Signed)
Refill request for Fluoxetine Last filled by MD on - 10.20.14. #90x1 Last AEX - New Pt 08.08.14; Acute visit 11.10.14 Next AEX - No future appointment scheduled Please Advise on refills/SLS

## 2013-11-08 NOTE — Telephone Encounter (Signed)
New rx request  Fluoxetine  [facility]

## 2013-11-09 NOTE — Telephone Encounter (Signed)
Appointment scheduled for 11/10/13

## 2013-11-10 ENCOUNTER — Ambulatory Visit: Payer: Medicare Other | Admitting: Physician Assistant

## 2013-11-10 MED ORDER — FLUOXETINE HCL 10 MG PO CAPS: ORAL_CAPSULE | ORAL | Status: DC

## 2013-11-10 NOTE — Telephone Encounter (Signed)
Received fax from [place] for Fluoxetine for pt [sent to [facility] on 06.15.15]; this pharmacy not listed in patient demographics, called patient to verify if change was needed in Demographics to send this refill request to new pharmacy. Patient verified Ok to change pharmacy in EMR and forwarded all future request to [place]; [facility] deleted from pt demographics and Fluoxetine request reordered to new pharmacy/SLS

## 2013-11-11 ENCOUNTER — Encounter: Payer: Self-pay | Admitting: Physician Assistant

## 2013-11-11 ENCOUNTER — Other Ambulatory Visit: Payer: Self-pay | Admitting: Physician Assistant

## 2013-11-11 ENCOUNTER — Ambulatory Visit (INDEPENDENT_AMBULATORY_CARE_PROVIDER_SITE_OTHER): Payer: Medicare HMO | Admitting: Physician Assistant

## 2013-11-11 VITALS — BP 122/84 | HR 75 | Temp 98.0°F | Resp 16 | Ht 67.0 in | Wt 188.0 lb

## 2013-11-11 DIAGNOSIS — R399 Unspecified symptoms and signs involving the genitourinary system: Secondary | ICD-10-CM

## 2013-11-11 DIAGNOSIS — R3989 Other symptoms and signs involving the genitourinary system: Secondary | ICD-10-CM

## 2013-11-11 DIAGNOSIS — M549 Dorsalgia, unspecified: Secondary | ICD-10-CM

## 2013-11-11 DIAGNOSIS — G8929 Other chronic pain: Secondary | ICD-10-CM

## 2013-11-11 DIAGNOSIS — R0789 Other chest pain: Secondary | ICD-10-CM

## 2013-11-11 DIAGNOSIS — Z9889 Other specified postprocedural states: Secondary | ICD-10-CM

## 2013-11-11 DIAGNOSIS — R5383 Other fatigue: Secondary | ICD-10-CM

## 2013-11-11 DIAGNOSIS — R109 Unspecified abdominal pain: Secondary | ICD-10-CM

## 2013-11-11 DIAGNOSIS — Z789 Other specified health status: Secondary | ICD-10-CM

## 2013-11-11 DIAGNOSIS — R5381 Other malaise: Secondary | ICD-10-CM

## 2013-11-11 LAB — POCT URINALYSIS DIPSTICK
Blood, UA: NEGATIVE
GLUCOSE UA: NEGATIVE
NITRITE UA: NEGATIVE
PH UA: 8
Spec Grav, UA: <=1.005
Urobilinogen, UA: 0.2

## 2013-11-11 LAB — BASIC METABOLIC PANEL
BUN: 18 mg/dL (ref 6–23)
CHLORIDE: 104 meq/L (ref 96–112)
CO2: 29 mEq/L (ref 19–32)
CREATININE: 0.85 mg/dL (ref 0.50–1.10)
Calcium: 9 mg/dL (ref 8.4–10.5)
Glucose, Bld: 88 mg/dL (ref 70–99)
POTASSIUM: 4.1 meq/L (ref 3.5–5.3)
Sodium: 139 mEq/L (ref 135–145)

## 2013-11-11 LAB — CBC
HEMATOCRIT: 41.3 % (ref 36.0–46.0)
HEMOGLOBIN: 14.2 g/dL (ref 12.0–15.0)
MCH: 30.7 pg (ref 26.0–34.0)
MCHC: 34.4 g/dL (ref 30.0–36.0)
MCV: 89.4 fL (ref 78.0–100.0)
Platelets: 182 10*3/uL (ref 150–400)
RBC: 4.62 MIL/uL (ref 3.87–5.11)
RDW: 13.4 % (ref 11.5–15.5)
WBC: 5.4 10*3/uL (ref 4.0–10.5)

## 2013-11-11 LAB — SEDIMENTATION RATE: SED RATE: 11 mm/h (ref 0–22)

## 2013-11-11 MED ORDER — CIPROFLOXACIN HCL 500 MG PO TABS: 500.0000 mg | ORAL_TABLET | Freq: Two times a day (BID) | ORAL | Status: DC

## 2013-11-11 NOTE — Progress Notes (Signed)
Pre visit review using our clinic review tool, if applicable. No additional management support is needed unless otherwise documented below in the visit note/SLS  

## 2013-11-11 NOTE — Patient Instructions (Signed)
Please continue medications as directed. Take ciprofloxacin as directed. Please obtain labs. Then stop by the front desk to get your CT scan scheduled.  I will call you with all of your results. Please add a fiber supplement to your diet and increase your fluid intake.

## 2013-11-12 ENCOUNTER — Ambulatory Visit (HOSPITAL_BASED_OUTPATIENT_CLINIC_OR_DEPARTMENT_OTHER)
Admission: RE | Admit: 2013-11-12 | Discharge: 2013-11-12 | Disposition: A | Discharge status: Home / Self Care | Payer: Medicare HMO | Source: Ambulatory Visit | Attending: Physician Assistant | Admitting: Physician Assistant

## 2013-11-12 ENCOUNTER — Encounter (HOSPITAL_BASED_OUTPATIENT_CLINIC_OR_DEPARTMENT_OTHER): Payer: Self-pay

## 2013-11-12 DIAGNOSIS — R109 Unspecified abdominal pain: Secondary | ICD-10-CM | POA: Insufficient documentation

## 2013-11-12 DIAGNOSIS — N200 Calculus of kidney: Secondary | ICD-10-CM | POA: Insufficient documentation

## 2013-11-12 DIAGNOSIS — G8929 Other chronic pain: Secondary | ICD-10-CM

## 2013-11-12 DIAGNOSIS — R112 Nausea with vomiting, unspecified: Secondary | ICD-10-CM | POA: Insufficient documentation

## 2013-11-12 DIAGNOSIS — R197 Diarrhea, unspecified: Secondary | ICD-10-CM | POA: Insufficient documentation

## 2013-11-12 DIAGNOSIS — Z853 Personal history of malignant neoplasm of breast: Secondary | ICD-10-CM | POA: Insufficient documentation

## 2013-11-12 DIAGNOSIS — Z85820 Personal history of malignant melanoma of skin: Secondary | ICD-10-CM | POA: Insufficient documentation

## 2013-11-12 DIAGNOSIS — K5900 Constipation, unspecified: Secondary | ICD-10-CM | POA: Insufficient documentation

## 2013-11-12 LAB — TSH: TSH: 1.149 u[IU]/mL (ref 0.350–4.500)

## 2013-11-12 LAB — COMPREHENSIVE METABOLIC PANEL
ALBUMIN: 4 g/dL (ref 3.5–5.2)
ALK PHOS: 70 U/L (ref 39–117)
ALT: 13 U/L (ref 0–35)
AST: 18 U/L (ref 0–37)
BUN: 18 mg/dL (ref 6–23)
CALCIUM: 9 mg/dL (ref 8.4–10.5)
CHLORIDE: 105 meq/L (ref 96–112)
CO2: 26 mEq/L (ref 19–32)
Creat: 0.83 mg/dL (ref 0.50–1.10)
GLUCOSE: 87 mg/dL (ref 70–99)
POTASSIUM: 4.3 meq/L (ref 3.5–5.3)
Sodium: 142 mEq/L (ref 135–145)
Total Bilirubin: 0.6 mg/dL (ref 0.2–1.2)
Total Protein: 6.1 g/dL (ref 6.0–8.3)

## 2013-11-12 LAB — LIPASE: LIPASE: 35 U/L (ref 0–75)

## 2013-11-12 LAB — H. PYLORI ANTIBODY, IGG: H Pylori IgG: <0.4 {ISR}

## 2013-11-12 MED ORDER — IOHEXOL 300 MG/ML  SOLN
100.0000 mL | Freq: Once | INTRAMUSCULAR | Status: AC | PRN
  Administered 2013-11-12: 100 mL via INTRAVENOUS

## 2013-11-21 DIAGNOSIS — R109 Unspecified abdominal pain: Secondary | ICD-10-CM

## 2013-11-21 DIAGNOSIS — G8929 Other chronic pain: Secondary | ICD-10-CM | POA: Insufficient documentation

## 2013-11-21 DIAGNOSIS — R399 Unspecified symptoms and signs involving the genitourinary system: Secondary | ICD-10-CM | POA: Insufficient documentation

## 2013-11-21 NOTE — Assessment & Plan Note (Signed)
Unclear etiology. Exam unremarkable. EKG obtained due to complain of co-existing chest tightness.  EKG within normal limits. Will obtain labs and CT abdomen/pelvis.  Increase fluids.  Avoid heavy foods.  Tylenol if needed for pain.  Will need referral to GI if symptoms persist and initial workup unremarkable.

## 2013-11-21 NOTE — Assessment & Plan Note (Signed)
Urine dip with moderate LE.  Will treat with Cipro.  Urine sent for culture.  Increase fluids.  Daily cranberry supplement and probiotic.

## 2013-11-21 NOTE — Progress Notes (Signed)
Patient presents to clinic today c/o nausea, fatigue and epigastric discomfort that has been present for 2 months.  States discomfort is intermittent.  Denies tenesmus, melena or hematochezia.  Denies vomiting.  Denies NSAID use.  Denies worsening discomfort after eating. Also endorses increased urinary urgency, frequency and some dysuria over the past week.  Also endorses chest tightness at present but denies chest pain, palpitations SOB or lightheadedness.  Past Medical History  Diagnosis Date  . GERD (gastroesophageal reflux disease)   . Hypertension   . Depression   . Breast cancer 1995  . Vitamin D deficiency   . Vitamin B12 deficiency   . Dyspnea     Normal Spirometry 03/2008 EF 65% BNP normal 11/2007  . 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 acetaminophen (TYLENOL) 500 MG tablet Take 500 mg by mouth as needed.        . Cholecalciferol (VITAMIN D3) 1000 UNITS CAPS Take 1 capsule by mouth daily.        . Cyanocobalamin (VITAMIN B-12 CR) 1000 MCG TBCR Take 1 tablet by mouth daily.        Marland Kitchen EPINEPHrine (EPIPEN 2-PAK) 0.3 mg/0.3 mL DEVI Inject 0.3 mLs (0.3 mg total) into the muscle once.  1 Device  2  . FLUoxetine (PROZAC) 10 MG capsule TAKE 1 CAPSULE BY MOUTH EVERY DAY  30 capsule  1  . furosemide (LASIX) 40 MG tablet Take 1 tablet (40 mg total) by mouth daily.  90 tablet  1  . ibuprofen (ADVIL,MOTRIN) 200 MG tablet Take 200 mg by mouth as needed.      Marland Kitchen omeprazole (PRILOSEC) 40 MG capsule Take 1 capsule (40 mg total) by mouth daily.  90 capsule  3  . Red Yeast Rice 600 MG TABS Take 1 tablet by mouth 2 (two) times daily.        . [DISCONTINUED] sucralfate (CARAFATE) 1 GM/10ML suspension Take 1 g by mouth at bedtime as needed.         Current Facility-Administered Medications on File Prior to Visit  Medication Dose Route Frequency Provider Last Rate Last Dose  . methylPREDNISolone acetate (DEPO-MEDROL)  injection 40 mg  40 mg Intra-articular Once Lew Dawes V, MD        Allergies  Allergen Reactions  . Ace Inhibitors     REACTION: angioedema  . Codeine   . Sulfonamide Derivatives   . Telmisartan     Family History  Problem Relation Age of Onset  . Stroke Other     F 1st degree relative 57, M 1st degree relative  . Colon cancer Cousin     Maternal Side  . Colon cancer Cousin     Paternal Side  . Colon polyps Father     History   Social History  . Marital Status: Married    Spouse Name: N/A    Number of Children: N/A  . Years of Education: N/A   Occupational History  . Manager    Social History Main Topics  . Smoking status: Never Smoker   . Smokeless tobacco: Never Used     Comment: Regular Exercise - Yes  . Alcohol Use: No  . Drug Use: No  . Sexual Activity: Yes   Other Topics Concern  . None   Social History Narrative  . None   Review of Systems - See HPI.  All other ROS are negative.  BP 122/84  Pulse 75  Temp(Src) 98 F (36.7 C) (Oral)  Resp 16  Ht 5\' 7"  (1.702 m)  Wt 188 lb (85.276 kg)  BMI 29.44 kg/m2  SpO2 98%  Physical Exam  Vitals reviewed. Constitutional: She is oriented to person, place, and time and well-developed, well-nourished, and in no distress.  HENT:  Head: Normocephalic and atraumatic.  Neck: Neck supple.  Cardiovascular: Normal rate, regular rhythm, normal heart sounds and intact distal pulses.   Pulmonary/Chest: Effort normal and breath sounds normal. No respiratory distress. She has no wheezes. She has no rales. She exhibits no tenderness.  Negative CVA tenderness.  Abdominal: Soft. Bowel sounds are normal. She exhibits no distension and no mass. There is no tenderness. There is no rebound and no guarding.  Lymphadenopathy:    She has no cervical adenopathy.  Neurological: She is alert and oriented to person, place, and time.  Skin: Skin is warm and dry. No rash noted.  Psychiatric: Affect normal.    Recent  Results (from the past 2160 hour(s))  POCT URINALYSIS DIPSTICK     Status: Abnormal   Collection Time    11/11/13  8:47 AM      Result Value Ref Range   Color, UA gold     Clarity, UA n/a     Comment: Not enough to determine   Glucose, UA neg     Bilirubin, UA small     Ketones, UA trace     Spec Grav, UA <=1.005     Blood, UA neg     pH, UA 8.0     Protein, UA 30+     Urobilinogen, UA 0.2     Nitrite, UA neg     Leukocytes, UA moderate (2+)    CBC     Status: None   Collection Time    11/11/13  9:40 AM      Result Value Ref Range   WBC 5.4  4.0 - 10.5 K/uL   RBC 4.62  3.87 - 5.11 MIL/uL   Hemoglobin 14.2  12.0 - 15.0 g/dL   HCT 41.3  36.0 - 46.0 %   MCV 89.4  78.0 - 100.0 fL   MCH 30.7  26.0 - 34.0 pg   MCHC 34.4  30.0 - 36.0 g/dL   RDW 13.4  11.5 - 15.5 %   Platelets 182  150 - 400 K/uL  COMPREHENSIVE METABOLIC PANEL     Status: None   Collection Time    11/11/13  9:40 AM      Result Value Ref Range   Sodium 142  135 - 145 mEq/L   Potassium 4.3  3.5 - 5.3 mEq/L   Chloride 105  96 - 112 mEq/L   CO2 26  19 - 32 mEq/L   Glucose, Bld 87  70 - 99 mg/dL   BUN 18  6 - 23 mg/dL   Creat 0.83  0.50 - 1.10 mg/dL   Total Bilirubin 0.6  0.2 - 1.2 mg/dL   Alkaline Phosphatase 70  39 - 117 U/L   AST 18  0 - 37 U/L   ALT 13  0 - 35 U/L   Total Protein 6.1  6.0 - 8.3 g/dL   Albumin 4.0  3.5 - 5.2 g/dL   Calcium 9.0  8.4 - 10.5 mg/dL  TSH     Status: None   Collection Time    11/11/13  9:40 AM      Result Value Ref Range  TSH 1.149  0.350 - 4.500 uIU/mL  SEDIMENTATION RATE     Status: None   Collection Time    11/11/13  9:40 AM      Result Value Ref Range   Sed Rate 11  0 - 22 mm/hr  LIPASE     Status: None   Collection Time    11/11/13  9:40 AM      Result Value Ref Range   Lipase 35  0 - 75 U/L  H. PYLORI ANTIBODY, IGG     Status: None   Collection Time    11/11/13  9:40 AM      Result Value Ref Range   H Pylori IgG <0.40     Comment: No significant level of  IgG antibody to H. pylori detected.              ISR = Immune Status Ratio                  <0.90         ISR       Negative                  0.90 - 1.09   ISR       Equivocal                  >=1.10        ISR       Positive           The above results were obtained with the Immulite 2000 H. pylori IgG     EIA.  Results obtained from other manufacturer's assay methods may not     be used interchangeably.        BASIC METABOLIC PANEL     Status: None   Collection Time    11/11/13 11:05 AM      Result Value Ref Range   Sodium 139  135 - 145 mEq/L   Potassium 4.1  3.5 - 5.3 mEq/L   Chloride 104  96 - 112 mEq/L   CO2 29  19 - 32 mEq/L   Glucose, Bld 88  70 - 99 mg/dL   BUN 18  6 - 23 mg/dL   Creat 0.85  0.50 - 1.10 mg/dL   Calcium 9.0  8.4 - 10.5 mg/dL    Assessment/Plan: Chronic abdominal pain Unclear etiology. Exam unremarkable. EKG obtained due to complain of co-existing chest tightness.  EKG within normal limits. Will obtain labs and CT abdomen/pelvis.  Increase fluids.  Avoid heavy foods.  Tylenol if needed for pain.  Will need referral to GI if symptoms persist and initial workup unremarkable.  UTI symptoms Urine dip with moderate LE.  Will treat with Cipro.  Urine sent for culture.  Increase fluids.  Daily cranberry supplement and probiotic.

## 2013-12-27 ENCOUNTER — Other Ambulatory Visit: Payer: Self-pay | Admitting: Physician Assistant

## 2013-12-27 NOTE — Telephone Encounter (Signed)
Rx request to pharmacy/SLS  

## 2014-01-27 ENCOUNTER — Telehealth: Payer: Self-pay | Admitting: Gastroenterology

## 2014-01-27 NOTE — Telephone Encounter (Signed)
Patient states that she has been having back pain and stomach pain. States every time she eats she has stomach pain, nausea and reports sweats this week. She is concerned that her Florence Canner is coming undone. She did see her PCP earlier this summer with some of these symptoms. She was treated at that time for UTI. She states she is going to call her PCP and have urine checked again.Former Dr. Sharlett Iles patient that would like Dr. Carlean Purl as her MD now. Hx Nissen. She is going out of town for the weekend. Scheduled with Alonza Bogus, PA on 02/02/14 at 1:30 PM.

## 2014-02-02 ENCOUNTER — Ambulatory Visit (INDEPENDENT_AMBULATORY_CARE_PROVIDER_SITE_OTHER): Payer: Medicare HMO | Admitting: Gastroenterology

## 2014-02-02 ENCOUNTER — Encounter: Payer: Self-pay | Admitting: Gastroenterology

## 2014-02-02 VITALS — BP 122/84 | HR 76 | Ht 67.0 in | Wt 187.8 lb

## 2014-02-02 DIAGNOSIS — R110 Nausea: Secondary | ICD-10-CM | POA: Insufficient documentation

## 2014-02-02 DIAGNOSIS — R1011 Right upper quadrant pain: Secondary | ICD-10-CM

## 2014-02-02 DIAGNOSIS — R1013 Epigastric pain: Secondary | ICD-10-CM | POA: Insufficient documentation

## 2014-02-02 NOTE — Patient Instructions (Signed)
STOP PRILOSEC/OMEPRAZOLE SIX HOURS BEFORE EXAM   You have been scheduled for a HIDA scan at [facility] (1st floor) on 02-07-2014. Please arrive 15 minutes prior to your scheduled appointment at  1 PM. Make certain not to have anything to eat or drink at least 6 hours prior to your test. Should this appointment date or time not work well for you, please call radiology scheduling at [phone].  _____________________________________________________________________ hepatobiliary (HIDA) scan is an imaging procedure used to diagnose problems in the liver, gallbladder and bile ducts. In the HIDA scan, a radioactive chemical or tracer is injected into a vein in your arm. The tracer is handled by the liver like bile. Bile is a fluid produced and excreted by your liver that helps your digestive system break down fats in the foods you eat. Bile is stored in your gallbladder and the gallbladder releases the bile when you eat a meal. A special nuclear medicine scanner (gamma camera) tracks the flow of the tracer from your liver into your gallbladder and small intestine.  During your HIDA scan  You'll be asked to change into a hospital gown before your HIDA scan begins. Your health care team will position you on a table, usually on your back. The radioactive tracer is then injected into a vein in your arm.The tracer travels through your bloodstream to your liver, where it's taken up by the bile-producing cells. The radioactive tracer travels with the bile from your liver into your gallbladder and through your bile ducts to your small intestine.You may feel some pressure while the radioactive tracer is injected into your vein. As you lie on the table, a special gamma camera is positioned over your abdomen taking pictures of the tracer as it moves through your body. The gamma camera takes pictures continually for about an hour. You'll need to keep still during the HIDA scan. This can become uncomfortable, but  you may find that you can lessen the discomfort by taking deep breaths and thinking about other things. Tell your health care team if you're uncomfortable. The radiologist will watch on a computer the progress of the radioactive tracer through your body. The HIDA scan may be stopped when the radioactive tracer is seen in the gallbladder and enters your small intestine. This typically takes about an hour. In some cases extra imaging will be performed if original images aren't satisfactory, if morphine is given to help visualize the gallbladder or if the medication CCK is given to look at the contraction of the gallbladder. This test typically takes 2 hours to complete. ________________________________________________________________________

## 2014-02-02 NOTE — Progress Notes (Signed)
     02/02/2014 [name] [id number] 1946/04/25   History of Present Illness: This is a 68 year old female who is previously a patient of Dr. Buel Ream; she did request for Dr. Carlean Purl as her new GI MD. She is status post Nissen fundoplication approximately 5 or 6 years ago for refractory reflux. She has been back on omeprazole 40 mg daily for quite some time, however. She presents to our office today with a few different complaints. She says that all of these symptoms began some time before the month of June, but seem to have gotten worse. She complains of epigastric abdominal burning, but also complains of right upper quadrant abdominal pain as well. She states that the right upper quadrant abdominal pain sometimes goes into her right lower side and seems to be associated with pain into the right side of her back. She complains that she seems to be clearing her throat and coughing more often again. Complains of nausea and that she generally just does not feel well.  She says that she can only eat small amounts at a time before feeling full. When she is going to have a bowel movement she sometimes feels as if she may passout. She denies having diarrhea, but her stools have been a little bit looser recently. She had a CT scan of the abdomen and pelvis with contrast in June, which showed postsurgical changes, grossly stable nodularity at the left lung base, and a nonobstructing calculus in the upper pole right kidney, but was otherwise unremarkable. CBC, CMP, TSH, sedimentation rate, CRP, H. pylori antibody IgG were all unremarkable. They did treat her for a urinary tract infection, but this did not seem to make any difference in her symptoms.  Her last colonoscopy was in March 2010 at which time the study was normal. Her last EGD was in October 2010 following her Nissen fundoplication at which time the study was normal status post surgical changes.   Current Medications, Allergies, Past Medical  History, Past Surgical History, Family History and Social History were reviewed in [employer] record.   Physical Exam: BP 122/84  Pulse 76  Ht 5\' 7"  (1.702 m)  Wt 187 lb 12.8 oz (85.186 kg)  BMI 29.41 kg/m2 General: Well developed white female in no acute distress Head: Normocephalic and atraumatic Eyes:  Sclerae anicteric, conjunctiva pink  Ears: Normal auditory acuity Lungs: Clear throughout to auscultation Heart: Regular rate and rhythm Abdomen: Soft, non-distended.  Normal bowel sounds.  Mild epigastric and RUQ TTP without R/R/G. Musculoskeletal: Symmetrical with no gross deformities  Extremities: No edema  Neurological: Alert oriented x 4, grossly non-focal Psychological:  Alert and cooperative. Normal mood and affect  Assessment and Recommendations: -68 year old female who is s/p Nissen fundoplication surgery who presents with complaints of nausea, belching, epigastric and RUQ abdominal pains.  I am unsure if this is related to her Nissen and reflux.  CT scan recently unremarkable.  Will check HIDA scan to rule out gallbladder dysfunction.  We considered muscular/nerve pain, but this wouldn't explain the worsening of her pain and symptoms with eating.  If HIDA is negative then could consider UGI to evaluate for slipped Nissen, etc.  Could consider back x-rays as well, however.  In the interim she will continue her daily PPI and will just take pepcid or Tums prn for now.

## 2014-02-03 ENCOUNTER — Other Ambulatory Visit: Payer: Self-pay | Admitting: Physician Assistant

## 2014-02-03 NOTE — Progress Notes (Signed)
Agree with Ms. Zehr's management.  Carl E. Gessner, MD, FACG  

## 2014-02-07 ENCOUNTER — Ambulatory Visit (HOSPITAL_COMMUNITY)
Admission: RE | Admit: 2014-02-07 | Discharge: 2014-02-07 | Disposition: A | Discharge status: Home / Self Care | Payer: Medicare HMO | Source: Ambulatory Visit | Attending: Gastroenterology | Admitting: Gastroenterology

## 2014-02-07 ENCOUNTER — Encounter (HOSPITAL_COMMUNITY): Payer: Self-pay

## 2014-02-07 DIAGNOSIS — R110 Nausea: Secondary | ICD-10-CM | POA: Diagnosis not present

## 2014-02-07 DIAGNOSIS — R1011 Right upper quadrant pain: Secondary | ICD-10-CM | POA: Insufficient documentation

## 2014-02-07 MED ORDER — TECHNETIUM TC 99M MEBROFENIN IV KIT: 5.0000 | PACK | Freq: Once | INTRAVENOUS | Status: AC | PRN

## 2014-02-07 MED ORDER — SINCALIDE 5 MCG IJ SOLR: 0.0200 ug/kg | Freq: Once | INTRAMUSCULAR | Status: DC

## 2014-02-11 ENCOUNTER — Telehealth: Payer: Self-pay | Admitting: *Deleted

## 2014-02-11 NOTE — Telephone Encounter (Signed)
Spoke with [facility] and patient is scheduled with Dr. Hassell Done on Oct 23 at 1:30 PM.

## 2014-02-11 NOTE — Telephone Encounter (Signed)
Message copied by Hulan Saas on Fri Feb 11, 2014  3:12 PM ------      Message from: Hulan Saas      Created: Wed Feb 09, 2014 11:45 AM       Did patient get scheduled at [facility] ------

## 2014-02-17 ENCOUNTER — Other Ambulatory Visit (INDEPENDENT_AMBULATORY_CARE_PROVIDER_SITE_OTHER): Payer: Self-pay

## 2014-02-17 DIAGNOSIS — R0789 Other chest pain: Secondary | ICD-10-CM

## 2014-02-17 DIAGNOSIS — Z9889 Other specified postprocedural states: Secondary | ICD-10-CM

## 2014-02-23 ENCOUNTER — Ambulatory Visit
Admission: RE | Admit: 2014-02-23 | Discharge: 2014-02-23 | Disposition: A | Discharge status: Home / Self Care | Payer: Medicare HMO | Source: Ambulatory Visit | Attending: Surgery | Admitting: Surgery

## 2014-02-23 DIAGNOSIS — Z9889 Other specified postprocedural states: Secondary | ICD-10-CM

## 2014-02-23 DIAGNOSIS — R0789 Other chest pain: Secondary | ICD-10-CM

## 2014-03-08 ENCOUNTER — Encounter: Payer: Self-pay | Admitting: Gastroenterology

## 2014-03-10 ENCOUNTER — Other Ambulatory Visit (INDEPENDENT_AMBULATORY_CARE_PROVIDER_SITE_OTHER): Payer: Self-pay | Admitting: Surgery

## 2014-03-10 DIAGNOSIS — R071 Chest pain on breathing: Secondary | ICD-10-CM

## 2014-03-15 ENCOUNTER — Ambulatory Visit
Admission: RE | Admit: 2014-03-15 | Discharge: 2014-03-15 | Disposition: A | Discharge status: Home / Self Care | Payer: Medicare HMO | Source: Ambulatory Visit | Attending: Surgery | Admitting: Surgery

## 2014-03-15 DIAGNOSIS — R071 Chest pain on breathing: Secondary | ICD-10-CM

## 2014-03-15 MED ORDER — IOHEXOL 300 MG/ML  SOLN
75.0000 mL | Freq: Once | INTRAMUSCULAR | Status: AC | PRN
  Administered 2014-03-15: 75 mL via INTRAVENOUS

## 2014-03-23 ENCOUNTER — Other Ambulatory Visit: Payer: Self-pay

## 2014-03-23 DIAGNOSIS — Z9011 Acquired absence of right breast and nipple: Secondary | ICD-10-CM

## 2014-03-23 DIAGNOSIS — Z1239 Encounter for other screening for malignant neoplasm of breast: Secondary | ICD-10-CM

## 2014-03-23 DIAGNOSIS — Z853 Personal history of malignant neoplasm of breast: Secondary | ICD-10-CM

## 2014-04-18 ENCOUNTER — Ambulatory Visit: Payer: Medicare HMO | Admitting: Physician Assistant

## 2014-04-19 ENCOUNTER — Ambulatory Visit (INDEPENDENT_AMBULATORY_CARE_PROVIDER_SITE_OTHER): Payer: Medicare HMO | Admitting: Physician Assistant

## 2014-04-19 ENCOUNTER — Encounter: Payer: Self-pay | Admitting: Physician Assistant

## 2014-04-19 VITALS — BP 137/73 | HR 73 | Temp 98.3°F | Wt 187.0 lb

## 2014-04-19 DIAGNOSIS — F329 Major depressive disorder, single episode, unspecified: Secondary | ICD-10-CM

## 2014-04-19 DIAGNOSIS — H6091 Unspecified otitis externa, right ear: Secondary | ICD-10-CM

## 2014-04-19 DIAGNOSIS — F32A Depression, unspecified: Secondary | ICD-10-CM

## 2014-04-19 MED ORDER — FLUOXETINE HCL 10 MG PO CAPS
ORAL_CAPSULE | ORAL | Status: DC
Start: 2014-04-19 — End: 2014-05-30

## 2014-04-19 MED ORDER — OFLOXACIN 0.3 % OT SOLN: 5.0000 [drp] | Freq: Every day | OTIC | Status: DC

## 2014-04-19 NOTE — Progress Notes (Signed)
Pre visit review using our clinic review tool, if applicable. No additional management support is needed unless otherwise documented below in the visit note. 

## 2014-04-19 NOTE — Assessment & Plan Note (Signed)
Rx Ofloxacin otic solution.  Supportive measures discussed.  Follow-up in 1 week if symptoms have not resolved.

## 2014-04-19 NOTE — Progress Notes (Signed)
Patient presents to clinic today c/o R otalgia x 2-3 weeks.  Denies fever, chills, nasal congestion.  Endorses sinus pressure. Denies ear drainage, tinnitus or decreased hearing.  Denies similar symptoms of L ear.  Patient also requesting refill of Prozac.  Was taking for her depression.  Endorses good improvement with medications.  Denies depressed mood or anhedonia. Denies SI/HI.  Past Medical History  Diagnosis Date  . GERD (gastroesophageal reflux disease)   . Hypertension   . Depression   . Breast cancer 1995  . Vitamin D deficiency   . Vitamin B12 deficiency   . Dyspnea     Normal Spirometry 03/2008 EF 65% BNP normal 11/2007  . 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 Lichen sclerosus     Current Outpatient Prescriptions on File Prior to Visit  Medication Sig Dispense Refill  . acetaminophen (TYLENOL) 500 MG tablet Take 500 mg by mouth as needed.      . Cholecalciferol (VITAMIN D3) 1000 UNITS CAPS Take 1 capsule by mouth daily.      . Cyanocobalamin (VITAMIN B-12 CR) 1000 MCG TBCR Take 1 tablet by mouth daily.      . furosemide (LASIX) 40 MG tablet TAKE 1 TABLET BY MOUTH DAILY 90 tablet 0  . ibuprofen (ADVIL,MOTRIN) 200 MG tablet Take 200 mg by mouth as needed.    Marland Kitchen EPINEPHrine (EPIPEN 2-PAK) 0.3 mg/0.3 mL DEVI Inject 0.3 mLs (0.3 mg total) into the muscle once. (Patient not taking: Reported on 04/19/2014) 1 Device 2  . omeprazole (PRILOSEC) 40 MG capsule Take 1 capsule (40 mg total) by mouth daily. 90 capsule 3  . Red Yeast Rice 600 MG TABS Take 1 tablet by mouth 2 (two) times daily.      . [DISCONTINUED] sucralfate (CARAFATE) 1 GM/10ML suspension Take 1 g by mouth at bedtime as needed.       Current Facility-Administered Medications on File Prior to Visit  Medication Dose Route Frequency Provider Last Rate Last Dose  . methylPREDNISolone acetate (DEPO-MEDROL) injection 40 mg  40 mg Intra-articular Once Lew Dawes V, MD        Allergies  Allergen Reactions  . Ace Inhibitors      REACTION: angioedema  . Codeine   . Sulfonamide Derivatives   . Telmisartan     Family History  Problem Relation Age of Onset  . Stroke Other     F 1st degree relative 40, M 1st degree relative  . Colon cancer Cousin     Maternal Side  . Colon cancer Cousin     Paternal Side  . Colon polyps Father     History   Social History  . Marital Status: Married    Spouse Name: N/A    Number of Children: N/A  . Years of Education: N/A   Occupational History  . Manager    Social History Main Topics  . Smoking status: Never Smoker   . Smokeless tobacco: Never Used     Comment: Regular Exercise - Yes  . Alcohol Use: No  . Drug Use: No  . Sexual Activity: Yes   Other Topics Concern  . None   Social History Narrative   Review of Systems - See HPI.  All other ROS are negative.  BP 137/73 mmHg  Pulse 73  Temp(Src) 98.3 F (36.8 C) (Oral)  Wt 187 lb (84.823 kg)  SpO2 99%  Physical Exam  Constitutional: She is oriented to person, place, and time and well-developed, well-nourished,  and in no distress.  HENT:  Head: Normocephalic and atraumatic.  Right Ear: There is swelling. Tympanic membrane is not erythematous, not retracted and not bulging.  Left Ear: Tympanic membrane, external ear and ear canal normal.  Nose: Nose normal.  Mouth/Throat: Uvula is midline, oropharynx is clear and moist and mucous membranes are normal.  Eyes: Conjunctivae are normal. Pupils are equal, round, and reactive to light.  Neck: Neck supple.  Cardiovascular: Normal rate, regular rhythm, normal heart sounds and intact distal pulses.   Pulmonary/Chest: Effort normal and breath sounds normal. No respiratory distress. She has no wheezes. She has no rales. She exhibits no tenderness.  Lymphadenopathy:    She has no cervical adenopathy.  Neurological: She is alert and oriented to person, place, and time.  Skin: Skin is warm and dry. No rash noted.  Psychiatric: Affect normal.  Vitals  reviewed.  Assessment/Plan: Right otitis externa Rx Ofloxacin otic solution.  Supportive measures discussed.  Follow-up in 1 week if symptoms have not resolved.  Depression Doping well with Prozac 10 mg daily.  Medications refilled.  Follow-up in 3 months.

## 2014-04-19 NOTE — Assessment & Plan Note (Signed)
Doping well with Prozac 10 mg daily.  Medications refilled.  Follow-up in 3 months.

## 2014-04-19 NOTE — Patient Instructions (Signed)
Please use ear drop following instructions to help cure infection.  Take a daily claritin.  Continue other medications as directed.  Apply cotton to the ear when showering to prevent water from entering ear canal.  Otitis Externa Otitis externa is a bacterial or fungal infection of the outer ear canal. This is the area from the eardrum to the outside of the ear. Otitis externa is sometimes called "swimmer's ear." CAUSES  Possible causes of infection include:  Swimming in dirty water.  Moisture remaining in the ear after swimming or bathing.  Mild injury (trauma) to the ear.  Objects stuck in the ear (foreign body).  Cuts or scrapes (abrasions) on the outside of the ear. SIGNS AND SYMPTOMS  The first symptom of infection is often itching in the ear canal. Later signs and symptoms may include swelling and redness of the ear canal, ear pain, and yellowish-white fluid (pus) coming from the ear. The ear pain may be worse when pulling on the earlobe. DIAGNOSIS  Your health care provider will perform a physical exam. A sample of fluid may be taken from the ear and examined for bacteria or fungi. TREATMENT  Antibiotic ear drops are often given for 10 to 14 days. Treatment may also include pain medicine or corticosteroids to reduce itching and swelling. HOME CARE INSTRUCTIONS   Apply antibiotic ear drops to the ear canal as prescribed by your health care provider.  Take medicines only as directed by your health care provider.  If you have diabetes, follow any additional treatment instructions from your health care provider.  Keep all follow-up visits as directed by your health care provider. PREVENTION   Keep your ear dry. Use the corner of a towel to absorb water out of the ear canal after swimming or bathing.  Avoid scratching or putting objects inside your ear. This can damage the ear canal or remove the protective wax that lines the canal. This makes it easier for bacteria and fungi to  grow.  Avoid swimming in lakes, polluted water, or poorly chlorinated pools.  You may use ear drops made of rubbing alcohol and vinegar after swimming. Combine equal parts of white vinegar and alcohol in a bottle. Put 3 or 4 drops into each ear after swimming. SEEK MEDICAL CARE IF:   You have a fever.  Your ear is still red, swollen, painful, or draining pus after 3 days.  Your redness, swelling, or pain gets worse.  You have a severe headache.  You have redness, swelling, pain, or tenderness in the area behind your ear. MAKE SURE YOU:   Understand these instructions.  Will watch your condition.  Will get help right away if you are not doing well or get worse. Document Released: 05/13/2005 Document Revised: 09/27/2013 Document Reviewed: 05/30/2011 [facility] Patient Information 2015 [place], [place]. This information is not intended to replace advice given to you by your health care provider. Make sure you discuss any questions you have with your health care provider.

## 2014-04-26 HISTORY — PX: BREAST BIOPSY: SHX20

## 2014-04-27 ENCOUNTER — Ambulatory Visit
Admission: RE | Admit: 2014-04-27 | Discharge: 2014-04-27 | Disposition: A | Discharge status: Home / Self Care | Payer: Medicare HMO | Source: Ambulatory Visit

## 2014-04-27 DIAGNOSIS — Z853 Personal history of malignant neoplasm of breast: Secondary | ICD-10-CM

## 2014-04-27 DIAGNOSIS — Z9011 Acquired absence of right breast and nipple: Secondary | ICD-10-CM

## 2014-04-27 DIAGNOSIS — Z1239 Encounter for other screening for malignant neoplasm of breast: Secondary | ICD-10-CM

## 2014-04-28 ENCOUNTER — Other Ambulatory Visit: Payer: Self-pay | Admitting: Obstetrics and Gynecology

## 2014-04-28 DIAGNOSIS — R928 Other abnormal and inconclusive findings on diagnostic imaging of breast: Secondary | ICD-10-CM

## 2014-05-09 ENCOUNTER — Other Ambulatory Visit: Payer: Self-pay | Admitting: Obstetrics and Gynecology

## 2014-05-09 ENCOUNTER — Ambulatory Visit
Admission: RE | Admit: 2014-05-09 | Discharge: 2014-05-09 | Disposition: A | Discharge status: Home / Self Care | Payer: Medicare HMO | Source: Ambulatory Visit | Attending: Obstetrics and Gynecology | Admitting: Obstetrics and Gynecology

## 2014-05-09 DIAGNOSIS — R928 Other abnormal and inconclusive findings on diagnostic imaging of breast: Secondary | ICD-10-CM

## 2014-05-10 ENCOUNTER — Other Ambulatory Visit: Payer: Self-pay | Admitting: Obstetrics and Gynecology

## 2014-05-10 ENCOUNTER — Other Ambulatory Visit: Payer: Self-pay | Admitting: Family Medicine

## 2014-05-10 DIAGNOSIS — C50911 Malignant neoplasm of unspecified site of right female breast: Secondary | ICD-10-CM

## 2014-05-10 DIAGNOSIS — C50912 Malignant neoplasm of unspecified site of left female breast: Secondary | ICD-10-CM

## 2014-05-13 NOTE — Progress Notes (Signed)
Spoke with pt. She has an appt with surgeon on Monday and an MRI set up for 12-22.

## 2014-05-16 ENCOUNTER — Encounter: Payer: Self-pay | Admitting: Physician Assistant

## 2014-05-16 ENCOUNTER — Telehealth: Payer: Self-pay | Admitting: *Deleted

## 2014-05-16 ENCOUNTER — Other Ambulatory Visit (INDEPENDENT_AMBULATORY_CARE_PROVIDER_SITE_OTHER): Payer: Self-pay | Admitting: *Deleted

## 2014-05-16 ENCOUNTER — Other Ambulatory Visit (INDEPENDENT_AMBULATORY_CARE_PROVIDER_SITE_OTHER): Payer: Self-pay

## 2014-05-16 ENCOUNTER — Other Ambulatory Visit: Payer: Self-pay | Admitting: Physician Assistant

## 2014-05-16 ENCOUNTER — Ambulatory Visit (HOSPITAL_COMMUNITY)
Admission: RE | Admit: 2014-05-16 | Discharge: 2014-05-16 | Disposition: A | Discharge status: Home / Self Care | Payer: Medicare HMO | Source: Ambulatory Visit | Attending: General Surgery | Admitting: General Surgery

## 2014-05-16 ENCOUNTER — Other Ambulatory Visit (INDEPENDENT_AMBULATORY_CARE_PROVIDER_SITE_OTHER): Payer: Self-pay | Admitting: General Surgery

## 2014-05-16 ENCOUNTER — Ambulatory Visit (INDEPENDENT_AMBULATORY_CARE_PROVIDER_SITE_OTHER): Payer: Medicare HMO | Admitting: Physician Assistant

## 2014-05-16 ENCOUNTER — Ambulatory Visit (HOSPITAL_BASED_OUTPATIENT_CLINIC_OR_DEPARTMENT_OTHER)
Admission: RE | Admit: 2014-05-16 | Discharge: 2014-05-16 | Disposition: A | Discharge status: Home / Self Care | Payer: Medicare HMO | Source: Ambulatory Visit | Attending: Physician Assistant | Admitting: Physician Assistant

## 2014-05-16 VITALS — BP 140/80 | HR 68 | Temp 98.5°F | Resp 16 | Ht 67.0 in | Wt 190.4 lb

## 2014-05-16 DIAGNOSIS — R079 Chest pain, unspecified: Secondary | ICD-10-CM

## 2014-05-16 DIAGNOSIS — Z170 Estrogen receptor positive status [ER+]: Secondary | ICD-10-CM

## 2014-05-16 DIAGNOSIS — R001 Bradycardia, unspecified: Secondary | ICD-10-CM | POA: Insufficient documentation

## 2014-05-16 DIAGNOSIS — I10 Essential (primary) hypertension: Secondary | ICD-10-CM

## 2014-05-16 DIAGNOSIS — C50412 Malignant neoplasm of upper-outer quadrant of left female breast: Secondary | ICD-10-CM

## 2014-05-16 HISTORY — DX: Estrogen receptor positive status (ER+): C50.412

## 2014-05-16 HISTORY — DX: Estrogen receptor positive status (ER+): Z17.0

## 2014-05-16 NOTE — Progress Notes (Signed)
Pre visit review using our clinic review tool, if applicable. No additional management support is needed unless otherwise documented below in the visit note/SLS  

## 2014-05-16 NOTE — Telephone Encounter (Signed)
Left message for a return phone call to schedule an appointment with Dr. Jana Hakim.  Awaiting patient response.

## 2014-05-16 NOTE — Progress Notes (Signed)
Patient presents to clinic today for discussion of today's EKG results and for further evaluation and workup of left-sided chest pain.  Patient was being evaluated by General Surgery (Dr. [name]) to discuss breast biopsy findings and to schedule lumpectomy. Patient endorses persistent left-sided chest pain since biopsy.  MD felt further evaluation was needed.  Called office and was instructed to get an EKG and send patient to ER if there was any concern for ACS.  EKG was obtained revealing sinus bradycardia.  Patient was told to schedule an appointment here.  Patient endorses "pain" is constant and feels like a tightness but sometimes achy.  Does go into shoulder.  Denies jaw pain.  Denies palpitations, lightheadedness, dizziness, SOB or fatigue.  Patient does endorse stress and anxiety secondary to recent diagnosis of breast cancer.  States her symptoms began the day after her biopsy.  Past Medical History  Diagnosis Date  . GERD (gastroesophageal reflux disease)   . Hypertension   . Depression   . Breast cancer 1995  . Vitamin D deficiency   . Vitamin B12 deficiency   . Dyspnea     Normal Spirometry 03/2008 EF 65% BNP normal 11/2007  . 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 Lichen sclerosus     Current Outpatient Prescriptions on File Prior to Visit  Medication Sig Dispense Refill  . acetaminophen (TYLENOL) 500 MG tablet Take 500 mg by mouth as needed.      . Cholecalciferol (VITAMIN D3) 1000 UNITS CAPS Take 1 capsule by mouth daily.      . Cyanocobalamin (VITAMIN B-12 CR) 1000 MCG TBCR Take 1 tablet by mouth daily.      Marland Kitchen FLUoxetine (PROZAC) 10 MG capsule TAKE 1 CAPSULE BY MOUTH EVERY DAY 90 capsule 1  . furosemide (LASIX) 40 MG tablet TAKE 1 TABLET BY MOUTH DAILY 90 tablet 0  . ibuprofen (ADVIL,MOTRIN) 200 MG tablet Take 200 mg by mouth as needed.    Marland Kitchen ofloxacin (FLOXIN) 0.3 % otic solution Place 5 drops into the right ear daily. 5 mL 0  . OVER THE COUNTER MEDICATION Probiotic 90 billion-Take 1  tablet by mouth daily.    Marland Kitchen EPINEPHrine (EPIPEN 2-PAK) 0.3 mg/0.3 mL DEVI Inject 0.3 mLs (0.3 mg total) into the muscle once. (Patient not taking: Reported on 04/19/2014) 1 Device 2  . [DISCONTINUED] sucralfate (CARAFATE) 1 GM/10ML suspension Take 1 g by mouth at bedtime as needed.       Current Facility-Administered Medications on File Prior to Visit  Medication Dose Route Frequency Provider Last Rate Last Dose  . methylPREDNISolone acetate (DEPO-MEDROL) injection 40 mg  40 mg Intra-articular Once Lew Dawes V, MD        Allergies  Allergen Reactions  . Ace Inhibitors     REACTION: angioedema  . Codeine   . Sulfonamide Derivatives   . Telmisartan     Family History  Problem Relation Age of Onset  . Stroke Other     F 1st degree relative 45, M 1st degree relative  . Colon cancer Cousin     Maternal Side  . Colon cancer Cousin     Paternal Side  . Colon polyps Father     History   Social History  . Marital Status: Married    Spouse Name: N/A    Number of Children: N/A  . Years of Education: N/A   Occupational History  . Manager    Social History Main Topics  . Smoking status: Never  Smoker   . Smokeless tobacco: Never Used     Comment: Regular Exercise - Yes  . Alcohol Use: No  . Drug Use: No  . Sexual Activity: Yes   Other Topics Concern  . None   Social History Narrative   Review of Systems - See HPI.  All other ROS are negative.  BP 140/80 mmHg  Pulse 68  Temp(Src) 98.5 F (36.9 C) (Oral)  Resp 16  Ht 5\' 7"  (1.702 m)  Wt 190 lb 6 oz (86.354 kg)  BMI 29.81 kg/m2  SpO2 100%  Physical Exam  Constitutional: She is oriented to person, place, and time and well-developed, well-nourished, and in no distress.  HENT:  Head: Normocephalic and atraumatic.  Eyes: Conjunctivae are normal.  Neck: Neck supple.  Cardiovascular: Normal rate, regular rhythm, normal heart sounds and intact distal pulses.   Pulmonary/Chest: Effort normal and breath sounds  normal. No respiratory distress. She has no wheezes. She has no rales. She exhibits no tenderness.  Musculoskeletal: Normal range of motion. She exhibits no edema or tenderness.  Lymphadenopathy:    She has no cervical adenopathy.  Neurological: She is alert and oriented to person, place, and time.  Skin: Skin is warm and dry. No rash noted.  Psychiatric:  Anxious affect  Vitals reviewed.  Assessment/Plan: Pain in the chest EKG without concerning findings.  Symptoms present for several days and patient with significant anxiety that seems possible cause of symptoms.  Cardiac etiology cannot be ruled out.  Will proceed with labs including troponin and CXR.  Referral to Cardiology placed for evaluation and for cardiac clearance for surgery.  Start 81 mg ASA daily. Discussed anxiety medications but patient declines.  Alarm signs/symptoms discussed.  Follow-up 1 week.

## 2014-05-16 NOTE — Patient Instructions (Signed)
Please start a baby aspirin 81 mg daily.  Your EKG looks good overall.  I will be checking some blood work and a chest x-ray today to further assess.  I have also placed a referral to Cardiology for further assessment and cardiac clearance for your lumpectomy.

## 2014-05-16 NOTE — Assessment & Plan Note (Signed)
EKG without concerning findings.  Symptoms present for several days and patient with significant anxiety that seems possible cause of symptoms.  Cardiac etiology cannot be ruled out.  Will proceed with labs including troponin and CXR.  Referral to Cardiology placed for evaluation and for cardiac clearance for surgery.  Start 81 mg ASA daily. Discussed anxiety medications but patient declines.  Alarm signs/symptoms discussed.  Follow-up 1 week.

## 2014-05-16 NOTE — Telephone Encounter (Signed)
Received call back from patient. Confirmed appointment for 05/18/14 at 930 am for FIN, lab, and then Dr. Jana Hakim. Contact information given.

## 2014-05-16 NOTE — Telephone Encounter (Signed)
error:[id number] ° °

## 2014-05-17 ENCOUNTER — Other Ambulatory Visit: Payer: Self-pay

## 2014-05-17 ENCOUNTER — Ambulatory Visit
Admission: RE | Admit: 2014-05-17 | Discharge: 2014-05-17 | Disposition: A | Discharge status: Home / Self Care | Payer: Medicare HMO | Source: Ambulatory Visit | Attending: Obstetrics and Gynecology | Admitting: Obstetrics and Gynecology

## 2014-05-17 ENCOUNTER — Encounter: Payer: Self-pay | Admitting: *Deleted

## 2014-05-17 DIAGNOSIS — R0789 Other chest pain: Secondary | ICD-10-CM

## 2014-05-17 DIAGNOSIS — Z9889 Other specified postprocedural states: Secondary | ICD-10-CM

## 2014-05-17 DIAGNOSIS — C50912 Malignant neoplasm of unspecified site of left female breast: Secondary | ICD-10-CM

## 2014-05-17 DIAGNOSIS — M549 Dorsalgia, unspecified: Secondary | ICD-10-CM

## 2014-05-17 DIAGNOSIS — R399 Unspecified symptoms and signs involving the genitourinary system: Secondary | ICD-10-CM

## 2014-05-17 LAB — COMPREHENSIVE METABOLIC PANEL
ALBUMIN: 4.3 g/dL (ref 3.5–5.2)
ALK PHOS: 66 U/L (ref 39–117)
ALT: 19 U/L (ref 0–35)
AST: 18 U/L (ref 0–37)
BUN: 20 mg/dL (ref 6–23)
CO2: 24 mEq/L (ref 19–32)
Calcium: 9.4 mg/dL (ref 8.4–10.5)
Chloride: 105 mEq/L (ref 96–112)
Creatinine, Ser: 0.9 mg/dL (ref 0.4–1.2)
GFR: 68.76 mL/min (ref >60.00)
GLUCOSE: 80 mg/dL (ref 70–99)
Potassium: 3.7 mEq/L (ref 3.5–5.1)
SODIUM: 137 meq/L (ref 135–145)
TOTAL PROTEIN: 7.1 g/dL (ref 6.0–8.3)
Total Bilirubin: 0.2 mg/dL (ref 0.2–1.2)

## 2014-05-17 LAB — CBC
HCT: 42.2 % (ref 36.0–46.0)
Hemoglobin: 14 g/dL (ref 12.0–15.0)
MCHC: 33.2 g/dL (ref 30.0–36.0)
MCV: 92.9 fl (ref 78.0–100.0)
PLATELETS: 196 10*3/uL (ref 150.0–400.0)
RBC: 4.55 Mil/uL (ref 3.87–5.11)
RDW: 13.3 % (ref 11.5–15.5)
WBC: 8.1 10*3/uL (ref 4.0–10.5)

## 2014-05-17 LAB — TROPONIN I: Troponin I: 0.01 ng/mL (ref <0.06)

## 2014-05-17 LAB — LIPASE: LIPASE: 40 U/L (ref 11.0–59.0)

## 2014-05-17 MED ORDER — GADOBENATE DIMEGLUMINE 529 MG/ML IV SOLN
18.0000 mL | Freq: Once | INTRAVENOUS | Status: AC | PRN
  Administered 2014-05-17: 18 mL via INTRAVENOUS

## 2014-05-17 NOTE — Progress Notes (Signed)
Received information from [facility] that pt has an appt w/ Dr. Jana Hakim.  Created chart.  Added to spreadsheet & placed in Dr. Virgie Dad box.

## 2014-05-17 NOTE — Addendum Note (Signed)
Addended by: Peggyann Shoals on: 05/17/2014 04:06 PM   Modules accepted: Orders

## 2014-05-17 NOTE — Progress Notes (Unsigned)
Called and spoke with patient to request she return to the clinic for redraw on Troponin 1.  Yesterday's draw was not processed due to [facility] being down. They stated that it was sent to [facility] but they have no records of ever receiving it.  New order placed, patient will come today for draw.

## 2014-05-18 ENCOUNTER — Telehealth: Payer: Self-pay | Admitting: Oncology

## 2014-05-18 ENCOUNTER — Ambulatory Visit (HOSPITAL_BASED_OUTPATIENT_CLINIC_OR_DEPARTMENT_OTHER): Payer: Medicare HMO

## 2014-05-18 ENCOUNTER — Ambulatory Visit (HOSPITAL_BASED_OUTPATIENT_CLINIC_OR_DEPARTMENT_OTHER): Payer: Medicare HMO | Admitting: Oncology

## 2014-05-18 ENCOUNTER — Encounter: Payer: Self-pay | Admitting: Oncology

## 2014-05-18 VITALS — BP 130/67 | HR 71 | Temp 98.6°F | Resp 18 | Ht 67.0 in | Wt 187.2 lb

## 2014-05-18 DIAGNOSIS — C50412 Malignant neoplasm of upper-outer quadrant of left female breast: Secondary | ICD-10-CM

## 2014-05-18 DIAGNOSIS — Z170 Estrogen receptor positive status [ER+]: Secondary | ICD-10-CM

## 2014-05-18 DIAGNOSIS — C50911 Malignant neoplasm of unspecified site of right female breast: Secondary | ICD-10-CM

## 2014-05-18 LAB — COMPREHENSIVE METABOLIC PANEL (CC13)
ALT: 21 U/L (ref 0–55)
AST: 23 U/L (ref 5–34)
Albumin: 3.9 g/dL (ref 3.5–5.0)
Alkaline Phosphatase: 76 U/L (ref 40–150)
Anion Gap: 10 mEq/L (ref 3–11)
BILIRUBIN TOTAL: 0.39 mg/dL (ref 0.20–1.20)
BUN: 17 mg/dL (ref 7.0–26.0)
CHLORIDE: 105 meq/L (ref 98–109)
CO2: 28 mEq/L (ref 22–29)
CREATININE: 1 mg/dL (ref 0.6–1.1)
Calcium: 9.6 mg/dL (ref 8.4–10.4)
EGFR: 59 mL/min/{1.73_m2} — AB (ref >90)
GLUCOSE: 101 mg/dL (ref 70–140)
Potassium: 3.6 mEq/L (ref 3.5–5.1)
Sodium: 143 mEq/L (ref 136–145)
Total Protein: 7 g/dL (ref 6.4–8.3)

## 2014-05-18 LAB — CBC WITH DIFFERENTIAL/PLATELET
BASO%: 0.8 % (ref 0.0–2.0)
BASOS ABS: 0 10*3/uL (ref 0.0–0.1)
EOS ABS: 0.2 10*3/uL (ref 0.0–0.5)
EOS%: 3.4 % (ref 0.0–7.0)
HEMATOCRIT: 43.3 % (ref 34.8–46.6)
HEMOGLOBIN: 14.6 g/dL (ref 11.6–15.9)
LYMPH#: 1.4 10*3/uL (ref 0.9–3.3)
LYMPH%: 25.9 % (ref 14.0–49.7)
MCH: 31.2 pg (ref 25.1–34.0)
MCHC: 33.7 g/dL (ref 31.5–36.0)
MCV: 92.5 fL (ref 79.5–101.0)
MONO#: 0.4 10*3/uL (ref 0.1–0.9)
MONO%: 7.8 % (ref 0.0–14.0)
NEUT#: 3.3 10*3/uL (ref 1.5–6.5)
NEUT%: 62.1 % (ref 38.4–76.8)
PLATELETS: 187 10*3/uL (ref 145–400)
RBC: 4.68 10*6/uL (ref 3.70–5.45)
RDW: 13.4 % (ref 11.2–14.5)
WBC: 5.3 10*3/uL (ref 3.9–10.3)

## 2014-05-18 MED ORDER — TAMOXIFEN CITRATE 20 MG PO TABS: 20.0000 mg | ORAL_TABLET | Freq: Every day | ORAL | Status: DC

## 2014-05-18 NOTE — Progress Notes (Signed)
Checked in new patient with no issues prior to seeing the dr. She will call [address] is any questions(gave card). I gave her [facility] packet.

## 2014-05-18 NOTE — Telephone Encounter (Signed)
per pof to sch pt appt-cld Dr Valetta Close office to sch appt-they req order to be faxed to [phone] and adv pt that the office would reach out to her-pt understood

## 2014-05-18 NOTE — Progress Notes (Signed)
[place]  Telephone:([phone] Fax:([phone]) [phone]     ID: [name] DOB: Mar 19, 1946  MR#: [id number]  FHQ#:[id number]  Patient Care Team: Brunetta Jeans, PA-C as PCP - General (Physician Assistant) Sable Feil, MD as Attending Physician (Gastroenterology) Josue Hector, MD (Cardiology) Cheri Fowler, MD as Attending Physician (Obstetrics and Gynecology) Chauncey Cruel, MD (Hematology and Oncology) PCP: Leeanne Rio, PA-C GYN: SU: Star Age MD OTHER MD: Crissie Reese M.D. , Nena Polio M.D., Silvano Rusk M.D.  CHIEF COMPLAINT: Left-sided breast cancer  CURRENT TREATMENT: Awaiting definitive surgery  RESEARCH PROTOCOL: No    Breast cancer of upper-outer quadrant of left female breast   05/09/2014 Initial Diagnosis Left breast  upper-outer quadrant biopsy shows an invasive breast cancer with lobular features, grade 1, estrogen receptor 100% positive, progesterone receptor 100% positive, with an MIB-1 of 8% and no HER-2 amplification     INTERVAL HISTORY: [name], now [name], was my patient in 1996, at which time she had an early stage breast cancer treated with mastectomy with TRAM flap reconstruction followed by CMF chemotherapy 8. She did not receive radiation or anti-estrogens. She was released from follow-up early this sensory.  More recently [name] had routine left screening mammography with tomography at the breast Center 04/27/2014. Breast density was category B. A possible mass in the left breast was noted and on 05/09/2014 she underwent diagnostic left mammography and ultrasonography showing a 9 mm spiculated asymmetry in the left breast upper outer quadrant. This was not palpable. Ultrasound identified a 1.0 cm irregular hypoechoic lesion in that area. Ultrasound of the left axilla was negative.  Biopsy of the left breast mass 05/09/2014 showed ([id number]) an invasive breast cancer with lobular features, grade 1. The  tumor was estrogen receptor 100% positive, progesterone receptor 99% positive, both with strong staining intensity. The MIB-1 was 8%. HER-2 was not amplified, the signals ratio being 1.10 and the number per cell 1.70.  On 05/17/2014 the patient underwent bilateral breast MRI. This showed breast composition category C. In the upper outer quadrant of the left breast there was stippled non-masslike enhancement measuring 3.9 cm. Within this region there was an area of slightly increased consolidation corresponding to the smaller mass noted on ultrasonography. There were no abnormal appearing lymph nodes in the right breast was unremarkable.  [name]'s case was presented at the breast cancer multidisciplinary conference 05/18/2014. The patient is a candidate for breast conservation, and if she opts for that her surgeon may wish to do a generous lumpectomy or proceed to biopsy of the more extensive area of stippled non-masslike enhancement. It was felt that since the patient had a mastectomy on the right she might choose a mastectomy on the left. An Oncotype test was also recommended.  [name] was evaluated at the breast clinic 05/18/2014. Her subsequent history is as detailed below  REVIEW OF SYSTEMS: There were no specific symptoms leading to the original mammogram, which was routinely scheduled. The patient denies unusual headaches, visual changes, nausea, vomiting, stiff neck, dizziness, or gait imbalance. There has been no cough, phlegm production, or pleurisy, no chest pain or pressure, and no change in bowel or bladder habits. The patient denies fever, rash, bleeding, unexplained fatigue or unexplained weight loss. Chanon did have an episode of left chest wall pain radiating down her left arm, after her left breast biopsy and mammography. She has been referred to cardiology as a result for presurgical clearance. A detailed review of  systems was otherwise entirely negative today.   Allergies  Allergen  Reactions  . Ace Inhibitors     REACTION: angioedema  . Codeine   . Sulfonamide Derivatives   . Telmisartan     Current Outpatient Prescriptions  Medication Sig Dispense Refill  . Cholecalciferol (VITAMIN D3) 1000 UNITS CAPS Take 1 capsule by mouth daily.      . Cyanocobalamin (VITAMIN B-12 CR) 1000 MCG TBCR Take 1 tablet by mouth daily.      Marland Kitchen FLUoxetine (PROZAC) 10 MG capsule TAKE 1 CAPSULE BY MOUTH EVERY DAY 90 capsule 1  . furosemide (LASIX) 40 MG tablet TAKE 1 TABLET BY MOUTH DAILY 90 tablet 0  . acetaminophen (TYLENOL) 500 MG tablet Take 500 mg by mouth as needed.      Marland Kitchen EPINEPHrine (EPIPEN 2-PAK) 0.3 mg/0.3 mL DEVI Inject 0.3 mLs (0.3 mg total) into the muscle once. (Patient not taking: Reported on 04/19/2014) 1 Device 2  . ibuprofen (ADVIL,MOTRIN) 200 MG tablet Take 200 mg by mouth as needed.    Marland Kitchen OVER THE COUNTER MEDICATION Probiotic 90 billion-Take 1 tablet by mouth daily.    . tamoxifen (NOLVADEX) 20 MG tablet Take 1 tablet (20 mg total) by mouth daily. 90 tablet 4  . [DISCONTINUED] sucralfate (CARAFATE) 1 GM/10ML suspension Take 1 g by mouth at bedtime as needed.       Current Facility-Administered Medications  Medication Dose Route Frequency Provider Last Rate Last Dose  . methylPREDNISolone acetate (DEPO-MEDROL) injection 40 mg  40 mg Intra-articular Once Cassandria Anger, MD        PAST MEDICAL HISTORY: Past Medical History  Diagnosis Date  . GERD (gastroesophageal reflux disease)   . Hypertension   . Depression   . Breast cancer 1995  . Vitamin D deficiency   . Vitamin B12 deficiency   . Dyspnea     Normal Spirometry 03/2008 EF 65% BNP normal 11/2007  . 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 Lichen sclerosus     PAST SURGICAL HISTORY: Past Surgical History  Procedure Laterality Date  . Mastectomy  1996    RT, no lymph nodes, chemotherapy  . Tonsillectomy    . Temporomandibular joint surgery    . Nissen fundoplication  [id number]/[id number]  . Melanoma excision      From  elbow-- Done at [facility]     FAMILY HISTORY Family History  Problem Relation Age of Onset  . Stroke Other     F 1st degree relative 26, M 1st degree relative  . Colon cancer Cousin     Maternal Side  . Colon cancer Cousin     Paternal Side  . Colon polyps Father    the patient's father died at age 12 in the setting of Alzheimer's disease. The patient's mother died at age 49 following a stroke. Cate has 3 brothers, no sisters. Hayleigh's niece, Kela Millin, was my patient with a history of breast cancer diagnosed at age 55. She succumbed to that tumor. The patient also has 2 paternal cousins with breast cancer diagnosed at age 53 and age 95. One brother has prostate cancer diagnosed at age 7. The other brother has a history of throat cancer diagnosed at age 33. There is no other history of breast or ovarian cancer in the family to her knowledge.  GENETICS TESTING: Requested  GYNECOLOGIC HISTORY:  No LMP recorded. Patient is postmenopausal. Menarche age 22, first live birth age 36. She is GX P2. She went through menopause in  1996 at the time of her chemotherapy. She did not take hormone replacement. 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 Her husband of 3 years, Marlou Sa, is a Gaffer at [employer]. He matches colors for a [employer]. Susen has 2 children from a prior marriage, Becky Sax, 68 years old, lives in [place] and is a homemaker; and Corene Cornea, 33, who lives in [place] and works as a Freight forwarder. Center has 4 biological grandchildren. Marlou Sa has a son, Mitzi Hansen, who lives in [place] and worse at [employer]. Marlou Sa has 4 grandchildren of his. The patient is a Psychologist, forensic.    ADVANCED DIRECTIVES: In place; currently Joley's children Becky Sax and Corene Cornea are co- healthcare powers of attorney. Corene Cornea can be reached at  [phone]   HEALTH MAINTENANCE: History  Substance Use Topics  . Smoking status: Never Smoker   . Smokeless tobacco: Never Used     Comment: Regular Exercise - Yes  . Alcohol Use: No     Colonoscopy:  PAP: 2013  Bone density: Remote  Lipid panel:    OBJECTIVE: Middle-aged white woman who appears stated age 14 Vitals:   05/18/14 1012  BP: 130/67  Pulse: 71  Temp: 98.6 F (37 C)  Resp: 18     Body mass index is 29.31 kg/(m^2).    ECOG FS:0 - Asymptomatic  Ocular: Sclerae unicteric, pupils equal, round and reactive to light Ear-nose-throat: Oropharynx clear, dentition in good repair Lymphatic: No cervical or supraclavicular adenopathy Lungs no rales or rhonchi, good excursion bilaterally Heart regular rate and rhythm, no murmur appreciated Abd soft, nontender, positive bowel sounds MSK no focal spinal tenderness, no joint edema Neuro: non-focal, well-oriented, appropriate affect Breasts: The right breast is status post mastectomy and TRAM reconstruction. The cosmetic result is excellent. There is no evidence of local recurrence. The right axilla is benign. The left breast is status post recent biopsy. There is a significant ecchymosis. There is no palpable mass. There are no skin or nipple changes of concern. The left axilla is benign.   LAB RESULTS:  CMP     Component Value Date/Time   NA 143 05/18/2014 0930   NA 137 05/16/2014 1650   K 3.6 05/18/2014 0930   K 3.7 05/16/2014 1650   CL 105 05/16/2014 1650   CO2 28 05/18/2014 0930   CO2 24 05/16/2014 1650   GLUCOSE 101 05/18/2014 0930   GLUCOSE 80 05/16/2014 1650   BUN 17.0 05/18/2014 0930   BUN 20 05/16/2014 1650   CREATININE 1.0 05/18/2014 0930   CREATININE 0.9 05/16/2014 1650   CREATININE 0.85 11/11/2013 1105   CALCIUM 9.6 05/18/2014 0930   CALCIUM 9.4 05/16/2014 1650   PROT 7.0 05/18/2014 0930   PROT 7.1 05/16/2014 1650   ALBUMIN 3.9 05/18/2014 0930   ALBUMIN 4.3 05/16/2014 1650   AST 23  05/18/2014 0930   AST 18 05/16/2014 1650   ALT 21 05/18/2014 0930   ALT 19 05/16/2014 1650   ALKPHOS 76 05/18/2014 0930   ALKPHOS 66 05/16/2014 1650   BILITOT 0.39 05/18/2014 0930   BILITOT 0.2 05/16/2014 1650   GFRNONAA 75* 11/22/2012 0825   GFRAA 87* 11/22/2012 0825    INo results found for: SPEP, UPEP  Lab Results  Component Value Date   WBC 5.3 05/18/2014   NEUTROABS 3.3 05/18/2014   HGB 14.6 05/18/2014   HCT 43.3 05/18/2014  MCV 92.5 05/18/2014   PLT 187 05/18/2014      Chemistry      Component Value Date/Time   NA 143 05/18/2014 0930   NA 137 05/16/2014 1650   K 3.6 05/18/2014 0930   K 3.7 05/16/2014 1650   CL 105 05/16/2014 1650   CO2 28 05/18/2014 0930   CO2 24 05/16/2014 1650   BUN 17.0 05/18/2014 0930   BUN 20 05/16/2014 1650   CREATININE 1.0 05/18/2014 0930   CREATININE 0.9 05/16/2014 1650   CREATININE 0.85 11/11/2013 1105      Component Value Date/Time   CALCIUM 9.6 05/18/2014 0930   CALCIUM 9.4 05/16/2014 1650   ALKPHOS 76 05/18/2014 0930   ALKPHOS 66 05/16/2014 1650   AST 23 05/18/2014 0930   AST 18 05/16/2014 1650   ALT 21 05/18/2014 0930   ALT 19 05/16/2014 1650   BILITOT 0.39 05/18/2014 0930   BILITOT 0.2 05/16/2014 1650       No results found for: LABCA2  No components found for: LABCA125  No results for input(s): INR in the last 168 hours.  Urinalysis    Component Value Date/Time   COLORURINE YELLOW 11/22/2012 0836   APPEARANCEUR CLEAR 11/22/2012 0836   LABSPEC 1.019 11/22/2012 0836   PHURINE 6.5 11/22/2012 0836   GLUCOSEU NEGATIVE 11/22/2012 0836   GLUCOSEU NEGATIVE 02/06/2011 0845   HGBUR NEGATIVE 11/22/2012 0836   BILIRUBINUR small 11/11/2013 0847   BILIRUBINUR NEGATIVE 11/22/2012 0836   KETONESUR NEGATIVE 11/22/2012 0836   PROTEINUR 30+ 11/11/2013 0847   PROTEINUR NEGATIVE 11/22/2012 0836   UROBILINOGEN 0.2 11/11/2013 0847   UROBILINOGEN 1.0 11/22/2012 0836   NITRITE neg 11/11/2013 0847   NITRITE NEGATIVE  11/22/2012 0836   LEUKOCYTESUR moderate (2+) 11/11/2013 0847    RADIOLOGY AND OTHER STUDIES: Dg Chest 2 View  05/17/2014   CLINICAL DATA:  Chest pain  EXAM: CHEST  2 VIEW  COMPARISON:  11/22/2012  FINDINGS: The heart size and mediastinal contours are within normal limits. Both lungs are clear. The visualized skeletal structures are unremarkable.  IMPRESSION: No active cardiopulmonary disease.   Electronically Signed   By: Franchot Gallo M.D.   On: 05/17/2014 08:36   Mr Breast Bilateral W Wo Contrast  05/17/2014   ADDENDUM REPORT: 05/17/2014 12:28  ADDENDUM: BUN and creatinine were obtained on site at [place] at  [address]  Results:  BUN 14 mg/dL,  Creatinine 0.9 mg/dL.   Electronically Signed   By: Shon Hale M.D.   On: 05/17/2014 12:28   05/17/2014   CLINICAL DATA:  Newly diagnosed left breast invasive mammary carcinoma and mammary carcinoma in situ following ultrasound-guided core biopsy of mass in the 230 o'clock location of the left breast. History of right breast cancer with mastectomy 1996.  LABS:  None applicable  EXAM: BILATERAL BREAST MRI WITH AND WITHOUT CONTRAST  TECHNIQUE: Multiplanar, multisequence MR images of both breasts were obtained prior to and following the intravenous administration of 27m of MultiHance.  THREE-DIMENSIONAL MR IMAGE RENDERING ON INDEPENDENT WORKSTATION:  Three-dimensional MR images were rendered by post-processing of the original MR data on an independent workstation. The three-dimensional MR images were interpreted, and findings are reported in the following complete MRI report for this study. Three dimensional images were evaluated at the independent DynaCad workstation  COMPARISON:  Mammogram from [facility]/2015 and earlier  FINDINGS: Breast composition: c:  Heterogeneous fibroglandular tissue  Background parenchymal enhancement: Moderate ; small foci  of enhancement in the left breast.  Right breast:  Status post mastectomy with tram flap reconstruction. No suspicious enhancement.  Left breast: Within the upper-outer quadrant of the left breast there is stippled non mass enhancement measuring 3.8 x 3.9 x 1.3 cm. Within this region there is low signal intensity artifact firm recent ultrasound-guided core biopsy which showed malignancy. No other suspicious enhancement identified in the left breast.  Lymph nodes: No abnormal appearing lymph nodes.  Ancillary findings:  None.  IMPRESSION: 1. Status post right mastectomy.  Tram flap is negative by MRI. 2. Non mass enhancement in the upper-outer quadrant of the left breast consistent with known malignancy.  RECOMMENDATION: Treatment plan is recommended regarding new left breast malignancy.  BI-RADS CATEGORY  6: Known biopsy-proven malignancy.  Electronically Signed: By: Shon Hale M.D. On: 05/17/2014 12:14   Mm Digital Diagnostic Unilat L  05/09/2014   CLINICAL DATA:  Status post ultrasound-guided core biopsy of the left breast.  EXAM: DIAGNOSTIC LEFT MAMMOGRAM POST ULTRASOUND BIOPSY  COMPARISON:  Previous exams  FINDINGS: Mammographic images were obtained following ultrasound guided biopsy of the left breast. Mammographic images show there is a ribbon shaped clip in the upper-outer quadrant of the left breast.  IMPRESSION: Status post ultrasound-guided core biopsy of the left breast with pathology pending.  Final Assessment: Post Procedure Mammograms for Marker Placement   Electronically Signed   By: Lillia Mountain M.D.   On: 05/09/2014 14:23   Mm Digital Diagnostic Unilat L  05/09/2014   CLINICAL DATA:  History of right breast cancer status post mastectomy 1996. Patient was called back from screening mammogram for a possible mass in the left breast.  EXAM: DIGITAL DIAGNOSTIC  LEFT MAMMOGRAM  ULTRASOUND LEFT BREAST  COMPARISON:  With priors.  ACR Breast Density Category b: There are scattered areas of fibroglandular density.  FINDINGS: Spot compression views of  the lateral aspect of the left breast shows persistence of a 9 mm spiculated asymmetry only seen on the CC view. There are no malignant type microcalcifications.  On physical exam, I do not palpate a mass in the left breast.  Ultrasound is performed, showing a 10 x 7 x 10 mm irregular hypoechoic lesion in the left breast at 2:30 3 cm from the nipple. Sonographic evaluation of the left axilla does not show any enlarged adenopathy.  IMPRESSION: Suspicious left breast mass.  RECOMMENDATION: Ultrasound-guided core biopsy of the left breast is recommended. Biopsy will be performed and dictated separately.  I have discussed the findings and recommendations with the patient. Results were also provided in writing at the conclusion of the visit. If applicable, a reminder letter will be sent to the patient regarding the next appointment.  BI-RADS CATEGORY  4: Suspicious.   Electronically Signed   By: Lillia Mountain M.D.   On: 05/09/2014 14:20   US Breast Ltd Uni Left Inc Axilla  05/09/2014   CLINICAL DATA:  History of right breast cancer status post mastectomy 1996. Patient was called back from screening mammogram for a possible mass in the left breast.  EXAM: DIGITAL DIAGNOSTIC  LEFT MAMMOGRAM  ULTRASOUND LEFT BREAST  COMPARISON:  With priors.  ACR Breast Density Category b: There are scattered areas of fibroglandular density.  FINDINGS: Spot compression views of the lateral aspect of the left breast shows persistence of a 9 mm spiculated asymmetry only seen on the CC view. There are no malignant type microcalcifications.  On physical exam, I do not palpate a mass in the  left breast.  Ultrasound is performed, showing a 10 x 7 x 10 mm irregular hypoechoic lesion in the left breast at 2:30 3 cm from the nipple. Sonographic evaluation of the left axilla does not show any enlarged adenopathy.  IMPRESSION: Suspicious left breast mass.  RECOMMENDATION: Ultrasound-guided core biopsy of the left breast is recommended. Biopsy will  be performed and dictated separately.  I have discussed the findings and recommendations with the patient. Results were also provided in writing at the conclusion of the visit. If applicable, a reminder letter will be sent to the patient regarding the next appointment.  BI-RADS CATEGORY  4: Suspicious.   Electronically Signed   By: Lillia Mountain M.D.   On: 05/09/2014 14:20   Mm Screening Breast Tomo Uni L  04/27/2014   CLINICAL DATA:  Screening.  EXAM: DIGITAL SCREENING UNILATERAL LEFT MAMMOGRAM WITH TOMO AND CAD  COMPARISON:  Previous exam(s)  ACR Breast Density Category b: There are scattered areas of fibroglandular density.  FINDINGS: In the left breast, a possible mass warrants further evaluation with spot compression views and possibly ultrasound. There has been a previous right mastectomy.  Images were processed with CAD.  IMPRESSION: Further evaluation is suggested for possible mass in the left breast.  RECOMMENDATION: Diagnostic mammogram and possibly ultrasound of the left breast. (Code:[id number]-L-46M)  The patient will be contacted regarding the findings, and additional imaging will be scheduled.  BI-RADS CATEGORY  0: Incomplete. Need additional imaging evaluation and/or prior mammograms for comparison.   Electronically Signed   By: Luberta Robertson M.D.   On: 04/27/2014 14:08   [place] Lt Breast Bx W Loc Dev 1st Lesion Img Bx Spec US Guide  05/10/2014   ADDENDUM REPORT: 05/10/2014 14:18  ADDENDUM: PATHOLOGY ADDENDUM  Pathology Breast, left, needle core biopsy, 2:30  - INVASIVE MAMMARY CARCINOMA.  - MAMMARY CARCINOMA IN SITU.  Pathology concordance with imaging findings: Yes  Recommendation: Consultation regarding treatment plan. MRI scheduled for the patient on 05/17/2014. Consultation will be arranged for the patient.  At the request of the patient, I spoke with her by telephone on 05/10/2014 at 14:17. She reports doing well after the biopsy .   Electronically Signed   By: Shon Hale M.D.   On: 05/10/2014 14:18    05/10/2014   CLINICAL DATA:  Suspicious left breast mass.  EXAM: ULTRASOUND GUIDED LEFT BREAST CORE NEEDLE BIOPSY  COMPARISON:  Previous exams.  PROCEDURE: I met with the patient and we discussed the procedure of ultrasound-guided biopsy, including benefits and alternatives. We discussed the high likelihood of a successful procedure. We discussed the risks of the procedure including infection, bleeding, tissue injury, clip migration, and inadequate sampling. Informed written consent was given. The usual time-out protocol was performed immediately prior to the procedure.  Using sterile technique and 2% Lidocaine as local anesthetic, under direct ultrasound visualization, a 12 gauge vacuum-assisteddevice was used to perform biopsy of a mass in the 2:30 region of the left breast using a inferior to superior approach. At the conclusion of the procedure, a ribbon shaped tissue marker clip was deployed into the biopsy cavity. Follow-up 2-view mammogram was performed and dictated separately.  IMPRESSION: Ultrasound-guided biopsy of the left breast. No apparent complications.  Electronically Signed: By: Lillia Mountain M.D. On: 05/09/2014 14:22      ASSESSMENT: 68 y.o. [place] woman status post left breast biopsy 05/09/2014 for a clinical T1-T2, N0 (stage I-II) invasive lobular breast cancer, estrogen receptor 100% positive, progesterone receptor 99% positive,  with an MIB-1 of 8%, and no HER-2 amplification.  (1) history of right modified radical mastectomy with TRAM reconstruction 1996, followed by chemotherapy for 6 months; did not receive antiestrogen therapy or radiation therapy adjuvantly  (2) tamoxifen started neoadjuvant 05/18/2014 in view of likely surgical the left; 10 years of antiestrogen planned  (3) the patient has opted for mastectomy over lumpectomy  (4) Oncotype will be sent from definitive surgery, but I anticipate chemotherapy will not be necessary  (5) genetics testing has been  requested  PLAN: We spent the better part of today's hour-long appointment discussing the biology of breast cancer in general, and the specifics of the patient's tumor in particular. I also showed Graviela the images from her breast MRI. There is a large area of stippled enhancement, which may be benign, may be DCIS, or might even be invasive, although that seems less likely. Within this area there is a smaller section which is clearly more dense. This is probably what is being seen on ultrasound.  Because her cancer appears to be lobular, it is more difficult to image and it could indeed be that all that we see in the left breast is tumor. In that case while a generous lumpectomy would still be possible, most likely there would not be enough breast left for good cosmesis. In addition, if she decided to try for breast conservation she would have to have biopsies of the more extreme sections of the abnormal part of the left breast. Of course, if she did have breast conservation she would also need radiation.  Ketzia understands that a mastectomy does not provide any survival advantage over lumpectomy with negative margins followed by radiation. She really does not want anymore mammograms does not want any more biopsies and certainly doesn't want any radiation. She has decided that she would like to proceed to mastectomy. Given this discussion, I have absolutely no problem with that decision.  We then discussed systemic therapy. She understands she is not a candidate for anti-HER-2 treatment, but a very good candidate for anti-estrogens. The issue of chemotherapy is more complicated. Lobular breast cancers do not appear to benefit as much from chemotherapy as ductal, and in any case she has a low-grade slow-growing tumor which likely would type out as luminal a. We are going to send an Oncotype from the definitive surgery but my expectation is that it will show this to be "low risk" and do not need  chemotherapy.  Finally I think Saloni would benefit from genetics testing and this is being operationalized.  Because she needs cardiology clearance before going to surgery, and because she is going on a cruise in February, her definitive local treatment will be delayed. Accordingly I think it would be a good idea for her to start anti-estrogens now. If anything this will make the surgery easier. It will also let us know whether she would tolerate anti-estrogens well. I discussed the possible toxicities, side effects and complications of tamoxifen as compared to aromatase inhibitors and we are starting tamoxifen now. She will call if there are any problems with that.  Otherwise she will see me again in early March. As she should have had her surgery and we should have the Oncotype result by then.  Eddith has a good understanding of the overall plan. She agrees with it. She knows the goal of treatment in her case is cure. She will call with any problems that may develop before her next visit here.   The patient  has a good understanding of the overall plan. She agrees with it. She knows the goal of treatment in her case is cure. She will call with any problems that may develop before her next visit here.  Chauncey Cruel, MD   05/18/2014 11:04 AM Medical Oncology and Hematology [facility] [address] [place], [place] [address] Tel. [phone]    Fax. [phone]

## 2014-05-19 ENCOUNTER — Telehealth: Payer: Self-pay

## 2014-05-19 NOTE — Telephone Encounter (Signed)
Ok to attempt trial off of medication for now. If symptoms worsen, she is to call

## 2014-05-19 NOTE — Telephone Encounter (Signed)
FYI-Kim with [facility] called to advice that there is an interaction between patient's tamoxifen (NOLVADEX) 20 MG tablet and FLUoxetine (PROZAC) 10 MG capsule. Kim recommends either effexor or celexa. Patient states that she may try to just not take an SSRI for now.

## 2014-05-25 ENCOUNTER — Telehealth: Payer: Self-pay | Admitting: *Deleted

## 2014-05-25 NOTE — Telephone Encounter (Signed)
Received request from Dr. Jana Hakim to schedule pt for genetics.  Called pt and confirmed 06/06/14 genetic appt w/ her.  Emailed Dr. Jana Hakim to make him aware.

## 2014-05-30 ENCOUNTER — Other Ambulatory Visit (INDEPENDENT_AMBULATORY_CARE_PROVIDER_SITE_OTHER): Payer: Self-pay | Admitting: General Surgery

## 2014-05-30 DIAGNOSIS — C50912 Malignant neoplasm of unspecified site of left female breast: Secondary | ICD-10-CM

## 2014-05-30 MED ORDER — CITALOPRAM HYDROBROMIDE 10 MG PO TABS: 10.0000 mg | ORAL_TABLET | Freq: Every day | ORAL | Status: DC

## 2014-05-30 NOTE — Telephone Encounter (Signed)
Patient states that she has been off the medication but she is starting to feel very anxious. She would like to try one of the other medications until she gets through this season. Please advise.   Let patient know when Rx sent.

## 2014-05-30 NOTE — Telephone Encounter (Signed)
LMOM with contact name and number [for return call, if needed] RE: new medication to pharmacy and further provider instructions/SLS

## 2014-05-30 NOTE — Telephone Encounter (Signed)
Will start Celexa 10 mg daily for now.  Follow-up in 3-4 weeks. If anxiety or mood worsens while on medication, stop medication and call office.

## 2014-05-31 ENCOUNTER — Other Ambulatory Visit: Payer: Self-pay | Admitting: Physician Assistant

## 2014-05-31 NOTE — Telephone Encounter (Signed)
Rx request to pharmacy/SLS  

## 2014-06-01 NOTE — Progress Notes (Signed)
Patient ID: ARBOR LEER, female   DOB: 1945-11-09, 69 y.o.   MRN: [id number]    69 y.o. referred by DR Hassell Done for preop clearance Previous right mastectomy XRT and chemo with recurrent breast CA on left for mastectomy soon.  Has started hormonal Rx  Seen by primary on 12/21And indicated left sided pain started after her breast biopsy STarted on ASA , CXR reviewed NAD and tropoinin negative  She has no history of CAD  Saw Dr Rollene Fare for dyspnea many years ago with negative w/u ? From diet drug.  Normal myovue at that time.  Pain is clearly muscular  Actually localized to shoulder and upper back.  Relieved with NSAI's  And warm heat.  Not worse with exercise.  Persistent last 3 weeks since mamogram and biopsy    ROS: Denies fever, malais, weight loss, blurry vision, decreased visual acuity, cough, sputum, SOB, hemoptysis, pleuritic pain, palpitaitons, heartburn, abdominal pain, melena, lower extremity edema, claudication, or rash.  All other systems reviewed and negative   General: Affect appropriate Healthy:  appears stated age 4: normal Neck supple with no adenopathy JVP normal no bruits no thyromegaly Lungs clear with no wheezing and good diaphragmatic motion Heart:  S1/S2 no murmur,rub, gallop or click  Previous right mastectomy  PMI normal Abdomen: benighn, BS positve, no tenderness, no AAA no bruit.  No HSM or HJR Distal pulses intact with no bruits No edema Neuro non-focal Skin warm and dry No muscular weakness  Medications Current Outpatient Prescriptions  Medication Sig Dispense Refill  . acetaminophen (TYLENOL) 500 MG tablet Take 500 mg by mouth as needed.      . Cholecalciferol (VITAMIN D3) 1000 UNITS CAPS Take 1 capsule by mouth daily.      . citalopram (CELEXA) 10 MG tablet Take 1 tablet (10 mg total) by mouth daily. 30 tablet 1  . Cyanocobalamin (VITAMIN B-12 CR) 1000 MCG TBCR Take 1 tablet by mouth daily.      Marland Kitchen EPINEPHrine (EPIPEN 2-PAK) 0.3 mg/0.3 mL DEVI  Inject 0.3 mLs (0.3 mg total) into the muscle once. (Patient not taking: Reported on 04/19/2014) 1 Device 2  . furosemide (LASIX) 40 MG tablet TAKE 1 TABLET BY MOUTH DAILY 90 tablet 0  . ibuprofen (ADVIL,MOTRIN) 200 MG tablet Take 200 mg by mouth as needed.    Marland Kitchen OVER THE COUNTER MEDICATION Probiotic 90 billion-Take 1 tablet by mouth daily.    . tamoxifen (NOLVADEX) 20 MG tablet Take 1 tablet (20 mg total) by mouth daily. 90 tablet 4  . [DISCONTINUED] sucralfate (CARAFATE) 1 GM/10ML suspension Take 1 g by mouth at bedtime as needed.       Current Facility-Administered Medications  Medication Dose Route Frequency Provider Last Rate Last Dose  . methylPREDNISolone acetate (DEPO-MEDROL) injection 40 mg  40 mg Intra-articular Once Lew Dawes V, MD        Allergies Ace inhibitors; Codeine; Sulfonamide derivatives; and Telmisartan  Family History: Family History  Problem Relation Age of Onset  . Stroke Other     F 1st degree relative 86, M 1st degree relative  . Colon cancer Cousin     Maternal Side  . Colon cancer Cousin     Paternal Side  . Colon polyps Father     Social History: History   Social History  . Marital Status: Married    Spouse Name: N/A    Number of Children: N/A  . Years of Education: N/A   Occupational History  . Manager  Social History Main Topics  . Smoking status: Never Smoker   . Smokeless tobacco: Never Used     Comment: Regular Exercise - Yes  . Alcohol Use: No  . Drug Use: No  . Sexual Activity: Yes   Other Topics Concern  . Not on file   Social History Narrative    Past Surgical History  Procedure Laterality Date  . Mastectomy  1996    RT, no lymph nodes, chemotherapy  . Tonsillectomy    . Temporomandibular joint surgery    . Nissen fundoplication  74/0814  . Melanoma excision      From elbow-- Done at [facility]     Past Medical History  Diagnosis Date  . GERD (gastroesophageal reflux disease)   . Hypertension   . Depression     . Breast cancer 1995  . Vitamin D deficiency   . Vitamin B12 deficiency   . Dyspnea     Normal Spirometry 03/2008 EF 65% BNP normal 11/2007  . 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 Lichen sclerosus     Electrocardiogram:  05/16/14  SR rate 51 normal ECG  Today 06/01/13  SR rate 64  Nonspecific ST/T Wave changes   Assessment and Plan

## 2014-06-02 ENCOUNTER — Encounter: Payer: Self-pay | Admitting: Cardiovascular Disease

## 2014-06-02 ENCOUNTER — Ambulatory Visit (INDEPENDENT_AMBULATORY_CARE_PROVIDER_SITE_OTHER): Payer: Medicare HMO | Admitting: Cardiovascular Disease

## 2014-06-02 VITALS — BP 126/80 | HR 65 | Ht 67.0 in | Wt 186.0 lb

## 2014-06-02 DIAGNOSIS — Z01810 Encounter for preprocedural cardiovascular examination: Secondary | ICD-10-CM | POA: Insufficient documentation

## 2014-06-02 DIAGNOSIS — I10 Essential (primary) hypertension: Secondary | ICD-10-CM

## 2014-06-02 NOTE — Assessment & Plan Note (Signed)
No history of CAD.  Atypical pain temporally related to mammogram and biopsy , relieved with NSAI's  ECG normal This is non cardiac neuropathic/muscular  pain  Clear to have surgery with no further testing

## 2014-06-02 NOTE — Assessment & Plan Note (Signed)
Well controlled.  Continue current medications and low sodium Dash type diet.    

## 2014-06-02 NOTE — Addendum Note (Signed)
Addended by: Marlis Edelson C on: 06/02/2014 01:26 PM   Modules accepted: Orders

## 2014-06-02 NOTE — Patient Instructions (Signed)
Your physician recommends that you schedule a follow-up appointment in: AS NEEDED  Your physician recommends that you continue on your current medications as directed. Please refer to the Current Medication list given to you today.  

## 2014-06-06 ENCOUNTER — Other Ambulatory Visit: Payer: Medicare HMO

## 2014-06-06 ENCOUNTER — Encounter: Payer: Self-pay | Admitting: Genetic Counselor

## 2014-06-06 ENCOUNTER — Ambulatory Visit (HOSPITAL_BASED_OUTPATIENT_CLINIC_OR_DEPARTMENT_OTHER): Payer: Medicare HMO | Admitting: Genetic Counselor

## 2014-06-06 DIAGNOSIS — C50911 Malignant neoplasm of unspecified site of right female breast: Secondary | ICD-10-CM

## 2014-06-06 DIAGNOSIS — Z803 Family history of malignant neoplasm of breast: Secondary | ICD-10-CM

## 2014-06-06 DIAGNOSIS — C439 Malignant melanoma of skin, unspecified: Secondary | ICD-10-CM

## 2014-06-06 DIAGNOSIS — C50412 Malignant neoplasm of upper-outer quadrant of left female breast: Secondary | ICD-10-CM

## 2014-06-06 DIAGNOSIS — C4490 Unspecified malignant neoplasm of skin, unspecified: Secondary | ICD-10-CM | POA: Insufficient documentation

## 2014-06-06 DIAGNOSIS — Z800 Family history of malignant neoplasm of digestive organs: Secondary | ICD-10-CM

## 2014-06-06 NOTE — Progress Notes (Signed)
REFERRING PROVIDER: Brunetta Jeans, PA-C [address] [place], [place] [address]   Lurline Del, MD  PRIMARY PROVIDER:  Leeanne Rio, PA-C  PRIMARY REASON FOR VISIT:  1. Breast cancer of upper-outer quadrant of left female breast   2. Breast cancer, right breast   3. Melanoma of skin   4. Family history of breast cancer   5. Family history of pancreatic cancer   6. Family history of colon cancer      HISTORY OF PRESENT ILLNESS:   [name], a 69 y.o. female, was seen for a [place] cancer genetics consultation at the request of Dr. Jana Hakim due to a personal and family history of cancer.  [name] presents to clinic today to discuss the possibility of a hereditary predisposition to cancer, genetic testing, and to further clarify her future cancer risks, as well as potential cancer risks for family members.   CANCER HISTORY:    Breast cancer of upper-outer quadrant of left female breast   05/09/2014 Initial Diagnosis Left breast  upper-outer quadrant biopsy shows an invasive breast cancer with lobular features, grade 1, estrogen receptor 100% positive, progesterone receptor 100% positive, with an MIB-1 of 8% and no HER-2 amplification     HISTORY OF PRESENT ILLNESS: In 1996, at the age of 69, [name] was diagnosed with breast cancer of the right breast. This was treated with mastectomy and chemotherapy.  Ms. Payment reports that this was not estrogen driven.  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  She was treated at [facility] with an excision of the melanoma.  She did not have further treatment and remembers being told that "they got it all".  In 2015, at the age of 69, [name] was diagnosed with breast cancer on her left breast.  This is ER+/PR+/Her2-.  HORMONAL RISK FACTORS:  Menarche was at age 1.  First live birth at age 29.  OCP use for approximately 20 years.  Ovaries intact: yes.  Hysterectomy: no.   Menopausal status: postmenopausal.  HRT use: 0 years. Colonoscopy: yes; normal. Mammogram within the last year: yes. Number of breast biopsies: 1. Up to date with pelvic exams:  yes. Any excessive radiation exposure in the past:  no  Past Medical History  Diagnosis Date  . GERD (gastroesophageal reflux disease)   . Hypertension   . Depression   . Breast cancer 1995  . Vitamin D deficiency   . Vitamin B12 deficiency   . Dyspnea     Normal Spirometry 03/2008 EF 65% BNP normal 11/2007  . 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 Lichen sclerosus   . Family history of breast cancer   . Family history of colon cancer   . Family history of pancreatic cancer   . Skin cancer 2010    melanoma  . Melanoma 2010    [facility] at [facility]    Past Surgical History  Procedure Laterality Date  . Mastectomy  1996    RT, no lymph nodes, chemotherapy  . Tonsillectomy    . Temporomandibular joint surgery    . Nissen fundoplication  41/3244  . Melanoma excision      From elbow-- Done at [facility]     History   Social History  . Marital Status: Married    Spouse Name: N/A    Number of Children: 2  . Years of Education: N/A   Occupational History  .  Manager    Social History Main Topics  . Smoking status: Never Smoker   . Smokeless tobacco: Never Used     Comment: Regular Exercise - Yes  . Alcohol Use: No  . Drug Use: No  . Sexual Activity: Yes   Other Topics Concern  . None   Social History Narrative     FAMILY HISTORY:  We obtained a detailed, 4-generation family history.  Significant diagnoses are listed below: Family History  Problem Relation Age of Onset  . Stroke Other     F 1st degree relative 15, M 1st degree relative  . Colon cancer Cousin 107    double first cousin  . Colon cancer Cousin 66    double first cousin  . Colon polyps Father     between 43-20  . Dementia Father   . Pancreatic cancer Brother 46  . Colon polyps Brother     between 10-20  . Cancer Paternal Uncle      NOS  . Anemia Paternal Grandfather     pernicious anemia  . Breast cancer Cousin 33    double first cousin  . Breast cancer Cousin 74    paternal cousin   Ms. Dutt's niece had breast cancer at age 40 and died at 60 during reconstruction.  She was born with spina bifida, and it is thought that something happened with her shunt.  Ms. Boteler had three brothers.  One died 3 weeks after a diagnosis of pancreatic cancer.  Another brother had throat cancer at 34.  Ms. Lona has three female double first cousins - one who had breast cancer at 58, and two that had colon cancer one at age 65 and the other at 68.  She has one paternal first cousin with breast cancer at 35.  Patient's maternal ancestors are of [place] and [place] descent, and paternal ancestors are of [place] and [place] descent. There is no reported Ashkenazi Jewish ancestry. There is no known consanguinity.  GENETIC COUNSELING ASSESSMENT: [name] is a 69 y.o. female with a personal and family history which somewhat suggestive of a hereditary cancer syndrome and predisposition to cancer. We, therefore, discussed and recommended the following at today's visit.   DISCUSSION: We reviewed the characteristics, features and inheritance patterns of hereditary cancer syndromes. We also discussed genetic testing, including the appropriate family members to test, the process of testing, insurance coverage and turn-around-time for results. We discussed several conditions that are concerning based on her personal and family history including BRCA mutations, PALB2 and CHEK2 mutations as well as Lynch syndrome.  Based on the cancer in her family we may want to consider a larger panel.  We discussed the implications of a negative, positive and/or variant of uncertain significant result. We recommended [name] pursue genetic testing for the CancerNext gene panel.  The CancerNext gene panel offered by [employer] and includes sequencing and  rearrangement analysis for the following 32 genes:   APC, ATM, BARD1, BMPR1A, BRCA1, BRCA2, BRIP1, CDH1, CDK4, CDKN2A, CHEK2, EPCAM, GREM1, MLH1, MRE11A, MSH2, MSH6, MUTYH, NBN, NF1, PALB2, PMS2, POLD1, POLE, PTEN, RAD50, RAD51D, SMAD4, SMARCA4, STK11, and TP53.     PLAN: After considering the risks, benefits, and limitations,[name]  provided informed consent to pursue genetic testing and the blood sample was sent to [facility] for analysis of the [place]. Results should be available within approximately 3-4 weeks' time, at which point they will be disclosed by telephone to [name], as will any  additional recommendations warranted by these results. [name] will receive a summary of her genetic counseling visit and a copy of her results once available. This information will also be available in Epic. We encouraged [name] to remain in contact with cancer genetics annually so that we can continuously update the family history and inform her of any changes in cancer genetics and testing that may be of benefit for her family. [name]'s questions were answered to her satisfaction today. Our contact information was provided should additional questions or concerns arise.  Lastly, we encouraged [name] to remain in contact with cancer genetics annually so that we can continuously update the family history and inform her of any changes in cancer genetics and testing that may be of benefit for this family.   Ms.  [name]'s questions were answered to her satisfaction today. Our contact information was provided should additional questions or concerns arise. Thank you for the referral and allowing [place] to share in the care of your patient.   Karen P. Florene Glen, [place], [facility] Certified Genetic Counselor [name] phone: [phone]  The patient was seen for a total of 45 minutes in face-to-face genetic counseling.  This patient was discussed with Drs. Magrinat, Lindi Adie and/or  Burr Medico who agrees with the above.    _______________________________________________________________________ For Office Staff:  Number of people involved in session: 1 Was an Intern/ student involved with case: no

## 2014-06-20 ENCOUNTER — Other Ambulatory Visit (INDEPENDENT_AMBULATORY_CARE_PROVIDER_SITE_OTHER): Payer: Self-pay | Admitting: General Surgery

## 2014-06-20 ENCOUNTER — Other Ambulatory Visit: Payer: Self-pay

## 2014-06-20 DIAGNOSIS — Z9011 Acquired absence of right breast and nipple: Secondary | ICD-10-CM

## 2014-06-20 DIAGNOSIS — N6310 Unspecified lump in the right breast, unspecified quadrant: Secondary | ICD-10-CM

## 2014-06-21 ENCOUNTER — Other Ambulatory Visit: Payer: Self-pay | Admitting: General Surgery

## 2014-06-21 ENCOUNTER — Other Ambulatory Visit: Payer: Self-pay

## 2014-06-21 ENCOUNTER — Other Ambulatory Visit (INDEPENDENT_AMBULATORY_CARE_PROVIDER_SITE_OTHER): Payer: Self-pay | Admitting: *Deleted

## 2014-06-21 DIAGNOSIS — Z9011 Acquired absence of right breast and nipple: Secondary | ICD-10-CM

## 2014-06-21 DIAGNOSIS — N6310 Unspecified lump in the right breast, unspecified quadrant: Secondary | ICD-10-CM

## 2014-06-24 ENCOUNTER — Ambulatory Visit
Admission: RE | Admit: 2014-06-24 | Discharge: 2014-06-24 | Disposition: A | Discharge status: Home / Self Care | Payer: Medicare HMO | Source: Ambulatory Visit | Attending: General Surgery | Admitting: General Surgery

## 2014-06-24 DIAGNOSIS — Z9011 Acquired absence of right breast and nipple: Secondary | ICD-10-CM

## 2014-06-24 DIAGNOSIS — N6310 Unspecified lump in the right breast, unspecified quadrant: Secondary | ICD-10-CM

## 2014-06-27 ENCOUNTER — Other Ambulatory Visit: Payer: Medicare HMO

## 2014-06-28 ENCOUNTER — Ambulatory Visit (INDEPENDENT_AMBULATORY_CARE_PROVIDER_SITE_OTHER): Payer: Medicare HMO | Admitting: Physician Assistant

## 2014-06-28 ENCOUNTER — Encounter: Payer: Self-pay | Admitting: Physician Assistant

## 2014-06-28 VITALS — BP 125/58 | HR 60 | Temp 98.5°F | Resp 16 | Ht 67.0 in | Wt 189.5 lb

## 2014-06-28 DIAGNOSIS — F329 Major depressive disorder, single episode, unspecified: Secondary | ICD-10-CM

## 2014-06-28 DIAGNOSIS — F32A Depression, unspecified: Secondary | ICD-10-CM

## 2014-06-28 MED ORDER — FUROSEMIDE 40 MG PO TABS: 40.0000 mg | ORAL_TABLET | Freq: Every day | ORAL | Status: DC

## 2014-06-28 MED ORDER — CITALOPRAM HYDROBROMIDE 10 MG PO TABS: 10.0000 mg | ORAL_TABLET | Freq: Every day | ORAL | Status: DC

## 2014-06-28 NOTE — Progress Notes (Signed)
Pre visit review using our clinic review tool, if applicable. No additional management support is needed unless otherwise documented below in the visit note/SLS  

## 2014-06-28 NOTE — Patient Instructions (Signed)
Please continue medications as directed. Follow-up with me in 3 months.  Best of Luck to you with your upcoming surgery!  Have fun on your cruise!

## 2014-06-28 NOTE — Progress Notes (Signed)
Patient presents to clinic today for follow-up of anxiety and depression after starting Citalopram 10 mg daily.  Endorses taking as directed. Notes improvement in symptoms with medication.  Denies SI/HI.  Past Medical History  Diagnosis Date  . GERD (gastroesophageal reflux disease)   . Hypertension   . Depression   . Breast cancer 1995  . Vitamin D deficiency   . Vitamin B12 deficiency   . Dyspnea     Normal Spirometry 03/2008 EF 65% BNP normal 11/2007  . 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 Lichen sclerosus   . Family history of breast cancer   . Family history of colon cancer   . Family history of pancreatic cancer   . Skin cancer 2010    melanoma  . Melanoma 2010    treated at [facility]    Current Outpatient Prescriptions on File Prior to Visit  Medication Sig Dispense Refill  . acetaminophen (TYLENOL) 500 MG tablet Take 500 mg by mouth as needed.      . Cholecalciferol (VITAMIN D3) 1000 UNITS CAPS Take 1 capsule by mouth daily.      . Cyanocobalamin (VITAMIN B-12 CR) 1000 MCG TBCR Take 1 tablet by mouth daily.      Marland Kitchen ibuprofen (ADVIL,MOTRIN) 200 MG tablet Take 200 mg by mouth as needed.    Marland Kitchen OVER THE COUNTER MEDICATION Probiotic 90 billion-Take 1 tablet by mouth daily.    . tamoxifen (NOLVADEX) 20 MG tablet Take 1 tablet (20 mg total) by mouth daily. 90 tablet 4  . [DISCONTINUED] sucralfate (CARAFATE) 1 GM/10ML suspension Take 1 g by mouth at bedtime as needed.       Current Facility-Administered Medications on File Prior to Visit  Medication Dose Route Frequency Provider Last Rate Last Dose  . methylPREDNISolone acetate (DEPO-MEDROL) injection 40 mg  40 mg Intra-articular Once Lew Dawes V, MD        Allergies  Allergen Reactions  . Ace Inhibitors     REACTION: angioedema  . Codeine   . Sulfonamide Derivatives   . Telmisartan     Family History  Problem Relation Age of Onset  . Stroke Other     F 1st degree relative 74, M 1st degree relative  . Colon cancer  Cousin 64    double first cousin  . Colon cancer Cousin 74    double first cousin  . Colon polyps Father     between 27-20  . Dementia Father   . Pancreatic cancer Brother 64  . Colon polyps Brother     between 10-20  . Cancer Paternal Uncle     NOS  . Anemia Paternal Grandfather     pernicious anemia  . Breast cancer Cousin 85    double first cousin  . Breast cancer Cousin 74    paternal cousin    History   Social History  . Marital Status: Married    Spouse Name: N/A    Number of Children: 2  . Years of Education: N/A   Occupational History  . Manager    Social History Main Topics  . Smoking status: Never Smoker   . Smokeless tobacco: Never Used     Comment: Regular Exercise - Yes  . Alcohol Use: No  . Drug Use: No  . Sexual Activity: Yes   Other Topics Concern  . None   Social History Narrative   Review of Systems - See HPI.  All other ROS are negative.  BP 125/58  mmHg  Pulse 60  Temp(Src) 98.5 F (36.9 C) (Oral)  Resp 16  Ht 5' 7"  (1.702 m)  Wt 189 lb 8 oz (85.957 kg)  BMI 29.67 kg/m2  SpO2 100%  Physical Exam  Constitutional: She is oriented to person, place, and time and well-developed, well-nourished, and in no distress.  HENT:  Head: Normocephalic and atraumatic.  Eyes: Conjunctivae are normal.  Cardiovascular: Normal rate, regular rhythm, normal heart sounds and intact distal pulses.   Pulmonary/Chest: Effort normal and breath sounds normal. No respiratory distress. She has no wheezes. She has no rales. She exhibits no tenderness.  Neurological: She is alert and oriented to person, place, and time.  Skin: Skin is warm and dry. No rash noted.  Psychiatric: Affect normal.  Vitals reviewed.   Recent Results (from the past 2160 hour(s))  CBC     Status: None   Collection Time: 05/16/14  4:50 PM  Result Value Ref Range   WBC 8.1 4.0 - 10.5 K/uL   RBC 4.55 3.87 - 5.11 Mil/uL   Platelets 196.0 150.0 - 400.0 K/uL   Hemoglobin 14.0 12.0 -  15.0 g/dL   HCT 42.2 36.0 - 46.0 %   MCV 92.9 78.0 - 100.0 fl   MCHC 33.2 30.0 - 36.0 g/dL   RDW 13.3 11.5 - 15.5 %  Lipase     Status: None   Collection Time: 05/16/14  4:50 PM  Result Value Ref Range   Lipase 40.0 11.0 - 59.0 U/L  Comp Met (CMET)     Status: None   Collection Time: 05/16/14  4:50 PM  Result Value Ref Range   Sodium 137 135 - 145 mEq/L   Potassium 3.7 3.5 - 5.1 mEq/L   Chloride 105 96 - 112 mEq/L   CO2 24 19 - 32 mEq/L   Glucose, Bld 80 70 - 99 mg/dL   BUN 20 6 - 23 mg/dL   Creatinine, Ser 0.9 0.4 - 1.2 mg/dL   Total Bilirubin 0.2 0.2 - 1.2 mg/dL   Alkaline Phosphatase 66 39 - 117 U/L   AST 18 0 - 37 U/L   ALT 19 0 - 35 U/L   Total Protein 7.1 6.0 - 8.3 g/dL   Albumin 4.3 3.5 - 5.2 g/dL   Calcium 9.4 8.4 - 10.5 mg/dL   GFR 68.76 >60.00 mL/min  Troponin I     Status: None   Collection Time: 05/17/14  4:06 PM  Result Value Ref Range   Troponin I 0.01 <0.06 ng/mL    Comment: No indication of myocardial injury.  CBC with Differential     Status: None   Collection Time: 05/18/14  9:30 AM  Result Value Ref Range   WBC 5.3 3.9 - 10.3 10e3/uL   NEUT# 3.3 1.5 - 6.5 10e3/uL   HGB 14.6 11.6 - 15.9 g/dL   HCT 43.3 34.8 - 46.6 %   Platelets 187 145 - 400 10e3/uL   MCV 92.5 79.5 - 101.0 fL   MCH 31.2 25.1 - 34.0 pg   MCHC 33.7 31.5 - 36.0 g/dL   RBC 4.68 3.70 - 5.45 10e6/uL   RDW 13.4 11.2 - 14.5 %   lymph# 1.4 0.9 - 3.3 10e3/uL   MONO# 0.4 0.1 - 0.9 10e3/uL   Eosinophils Absolute 0.2 0.0 - 0.5 10e3/uL   Basophils Absolute 0.0 0.0 - 0.1 10e3/uL   NEUT% 62.1 38.4 - 76.8 %   LYMPH% 25.9 14.0 - 49.7 %   MONO% 7.8 0.0 -  14.0 %   EOS% 3.4 0.0 - 7.0 %   BASO% 0.8 0.0 - 2.0 %  Comprehensive metabolic panel (Cmet) - [facility]     Status: Abnormal   Collection Time: 05/18/14  9:30 AM  Result Value Ref Range   Sodium 143 136 - 145 mEq/L   Potassium 3.6 3.5 - 5.1 mEq/L   Chloride 105 98 - 109 mEq/L   CO2 28 22 - 29 mEq/L   Glucose 101 70 - 140 mg/dl   BUN 17.0 7.0 -  26.0 mg/dL   Creatinine 1.0 0.6 - 1.1 mg/dL   Total Bilirubin 0.39 0.20 - 1.20 mg/dL   Alkaline Phosphatase 76 40 - 150 U/L   AST 23 5 - 34 U/L   ALT 21 0 - 55 U/L   Total Protein 7.0 6.4 - 8.3 g/dL   Albumin 3.9 3.5 - 5.0 g/dL   Calcium 9.6 8.4 - 10.4 mg/dL   Anion Gap 10 3 - 11 mEq/L   EGFR 59 (L) >90 ml/min/1.73 m2    Comment: eGFR is calculated using the CKD-EPI Creatinine Equation (2009)    Assessment/Plan: Depression Stable.  Continue current regimen. Mediations refilled.

## 2014-06-29 ENCOUNTER — Encounter: Payer: Self-pay | Admitting: Genetic Counselor

## 2014-06-29 DIAGNOSIS — Z1379 Encounter for other screening for genetic and chromosomal anomalies: Secondary | ICD-10-CM | POA: Insufficient documentation

## 2014-06-29 NOTE — Assessment & Plan Note (Signed)
Stable.  Continue current regimen. Mediations refilled.

## 2014-06-30 ENCOUNTER — Encounter: Payer: Self-pay | Admitting: Genetic Counselor

## 2014-06-30 ENCOUNTER — Telehealth: Payer: Self-pay | Admitting: Genetic Counselor

## 2014-06-30 NOTE — Progress Notes (Signed)
HPI: Ms. Schicker was previously seen in the [facility] due to a personal and family history of cancer and concerns regarding a hereditary predisposition to cancer. Please refer to our prior cancer genetics clinic note for more information regarding Ms. Enoch's medical, social and family histories, and our assessment and recommendations, at the time. Ms. Postlethwait's recent genetic test results were disclosed to her, as were recommendations warranted by these results. These results and recommendations are discussed in more detail below.  GENETIC TEST RESULTS: At the time of Ms. Mceuen's visit, we recommended she pursue genetic testing of the CancerNext gene panel. The CancerNext gene panel offered by [employer] and includes sequencing and rearrangement analysis for the following 32 genes:   APC, ATM, BARD1, BMPR1A, BRCA1, BRCA2, BRIP1, CDH1, CDK4, CDKN2A, CHEK2, EPCAM, GREM1, MLH1, MRE11A, MSH2, MSH6, MUTYH, NBN, NF1, PALB2, PMS2, POLD1, POLE, PTEN, RAD50, RAD51D, SMAD4, SMARCA4, STK11, and TP53.  The report date is June 28, 2014.  Testing was performed at [facility]. 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 Heterozygous MUTYH mutations are not associated with an increased risk for cancer in the patient, however, other family members may be at risk for homozygous mutations and therefore family testing should be considered. The test report has been scanned into EPIC and is located under the Media tab. Ms. Chriswell was emailed a copy of her report via secure email.  We discussed with Ms. Waynick that since the current genetic testing is not perfect, it is possible there may be a gene mutation in one of these genes that current testing cannot detect, but that chance is small. We also discussed, that it is possible that another gene that has not yet been discovered, or that we have not yet tested, is responsible for the cancer  diagnoses in the family, and it is, therefore, important to remain in touch with cancer genetics in the future so that we can continue to offer Ms. Marlette the most up to date genetic testing.   CANCER SCREENING RECOMMENDATIONS: This result is reassuring and suggests that Ms. Houchins's personal history of cancer was most likely not due to an inherited predisposition associated with one of these genes, however other family members should consider testing in to determine if others in the family may have a hereditary cancer syndrome. Most cancers happen by chance and this negative test, along with details of her family history, suggests that her cancer falls into this category. We, therefore, recommended she continue to follow the cancer management and screening guidelines provided by her oncology and primary providers.   RECOMMENDATIONS FOR FAMILY MEMBERS: Women in this family might be at some increased risk of developing cancer, over the general population risk, simply due to the family history of cancer. We recommended women in this family have a yearly mammogram beginning at age 28, or 17 years younger than the earliest onset of cancer, an an annual clinical breast exam, and perform monthly breast self-exams. Women in this family should also have a gynecological exam as recommended by their primary provider. All family members should have a colonoscopy by age 65.  FOLLOW-UP: Lastly, we discussed with Ms. Bena that cancer genetics is a rapidly advancing field and it is possible that new genetic tests will be appropriate for her and/or her family members in the future. We encouraged her to remain in contact with cancer genetics on an annual basis so we can update her personal and family  histories and let her know of advances in cancer genetics that may benefit this family.   Our contact number was provided. Ms. Gledhill's questions were answered to her satisfaction, and she knows she is welcome to call us at  anytime with additional questions or concerns.   Roma Kayser, MS, [facility] Certified Genetic Counselor [name]

## 2014-06-30 NOTE — Telephone Encounter (Signed)
Revealed MUTYH heterozygous mutation, but no other deleterious mutation explaining her breast cancer.  Discussed recessive inheritance and that her children should be tested.

## 2014-07-02 ENCOUNTER — Other Ambulatory Visit: Payer: Self-pay | Admitting: Oncology

## 2014-07-02 NOTE — Progress Notes (Unsigned)
At the time of Ms. Simonetti's visit, we recommended she pursue genetic testing of the CancerNext gene panel. The CancerNext gene panel offered by [facility] and includes sequencing and rearrangement analysis for the following 32 genes: APC, ATM, BARD1, BMPR1A, BRCA1, BRCA2, BRIP1, CDH1, CDK4, CDKN2A, CHEK2, EPCAM, GREM1, MLH1, MRE11A, MSH2, MSH6, MUTYH, NBN, NF1, PALB2, PMS2, POLD1, POLE, PTEN, RAD50, RAD51D, SMAD4, SMARCA4, STK11, and TP53. The report date is June 28, 2014. Testing was performed at [facility]. 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 Heterozygous MUTYH mutations are not associated with an increased risk for cancer in the patient, however, other family members may be at risk for homozygous mutations and therefore family testing should be considered. The test report has been scanned into EPIC and is located under the Media tab. Ms. Fawcett was emailed a copy of her report via secure email. 

## 2014-07-13 ENCOUNTER — Other Ambulatory Visit (HOSPITAL_COMMUNITY): Payer: Self-pay | Admitting: Plastic Surgery

## 2014-07-19 NOTE — Pre-Procedure Instructions (Addendum)
[name]  07/19/2014   Your procedure is scheduled on:  Thursday, March 3.   Report to [facility] Admitting at 5:30AM.  Call this number if you have problems the morning of surgery: [phone]              For any other questions, please call [phone], Monday - Friday 8 AM - 4 PM.  Remember:   Do not eat food or drink liquids after midnight Wednesday, March 2.   Take these medicines the morning of surgery with A SIP OF WATER: citalopram (CELEXA).              Stop taking Aspirin, Coumadin, Plavix, Effient and Herbal medications.  Do not take any NSAIDs ie: Ibuprofen,  Advil,Naproxen or any medication containing Aspirin.   Do not wear jewelry, make-up or nail polish.  Do not wear lotions, powders, or perfumes.   Do not shave 48 hours prior to surgery.   Do not bring valuables to the hospital.              [name] is not responsible for any belongings or valuables.               Contacts, dentures or bridgework may not be worn into surgery.  Leave suitcase in the car. After surgery it may be brought to your room.  For patients admitted to the hospital, discharge time is determined by your treatment team.               Patients discharged the day of surgery will not be allowed to drive home.  Name and phone number of your driver: -   Special Instructions: [place] - Preparing for Surgery  Before surgery, you can play an important role.  Because skin is not sterile, your skin needs to be as free of germs as possible.  You can reduce the number of germs on you skin by washing with CHG (chlorahexidine gluconate) soap before surgery.  CHG is an antiseptic cleaner which kills germs and bonds with the skin to continue killing germs even after washing.  Please DO NOT use if you have an allergy to CHG or antibacterial soaps.  If your skin becomes reddened/irritated stop using the CHG and inform your nurse when you arrive at Short Stay.  Do not shave (including  legs and underarms) for at least 48 hours prior to the first CHG shower.  You may shave your face.  Please follow these instructions carefully:   1.  Shower with CHG Soap the night before surgery and the                                morning of Surgery.  2.  If you choose to wash your hair, wash your hair first as usual with your       normal shampoo.  3.  After you shampoo, rinse your hair and body thoroughly to remove the                      Shampoo.  4.  Use CHG as you would any other liquid soap.  You can apply chg directly       to the skin and wash gently with scrungie or a clean washcloth.  5.  Apply the CHG Soap to your body ONLY FROM THE NECK DOWN.  Do not use on open wounds or open sores.  Avoid contact with your eyes,       ears, mouth and genitals (private parts).  Wash genitals (private parts)       with your normal soap.  6.  Wash thoroughly, paying special attention to the area where your surgery        will be performed.  7.  Thoroughly rinse your body with warm water from the neck down.  8.  DO NOT shower/wash with your normal soap after using and rinsing off       the CHG Soap.  9.  Pat yourself dry with a clean towel.            10.  Wear clean pajamas.            11.  Place clean sheets on your bed the night of your first shower and do not        sleep with pets.  Day of Surgery  Do not apply any lotions/deoderants the morning of surgery.  Please wear clean clothes to the hospital/surgery center.      Please read over the following fact sheets that you were given: Pain Booklet, Coughing and Deep Breathing and Surgical Site Infection Prevention

## 2014-07-20 ENCOUNTER — Encounter (HOSPITAL_COMMUNITY)
Admission: RE | Admit: 2014-07-20 | Discharge: 2014-07-20 | Disposition: A | Discharge status: Home / Self Care | Payer: Medicare HMO | Source: Ambulatory Visit | Attending: General Surgery | Admitting: General Surgery

## 2014-07-20 ENCOUNTER — Ambulatory Visit (HOSPITAL_COMMUNITY)
Admission: RE | Admit: 2014-07-20 | Discharge: 2014-07-20 | Disposition: A | Discharge status: Home / Self Care | Payer: Medicare HMO | Source: Ambulatory Visit | Attending: General Surgery | Admitting: General Surgery

## 2014-07-20 ENCOUNTER — Encounter (HOSPITAL_COMMUNITY): Payer: Self-pay

## 2014-07-20 DIAGNOSIS — Z01818 Encounter for other preprocedural examination: Secondary | ICD-10-CM | POA: Diagnosis not present

## 2014-07-20 DIAGNOSIS — C50412 Malignant neoplasm of upper-outer quadrant of left female breast: Secondary | ICD-10-CM | POA: Diagnosis not present

## 2014-07-20 HISTORY — DX: Unspecified osteoarthritis, unspecified site: M19.90

## 2014-07-20 HISTORY — DX: Other complications of anesthesia, initial encounter: T88.59XA

## 2014-07-20 HISTORY — DX: Personal history of other diseases of the digestive system: Z87.19

## 2014-07-20 HISTORY — DX: Disease of blood and blood-forming organs, unspecified: D75.9

## 2014-07-20 HISTORY — DX: Adverse effect of unspecified anesthetic, initial encounter: T41.45XA

## 2014-07-20 LAB — CBC WITH DIFFERENTIAL/PLATELET
Basophils Absolute: 0 10*3/uL (ref 0.0–0.1)
Basophils Relative: 1 % (ref 0–1)
EOS PCT: 3 % (ref 0–5)
Eosinophils Absolute: 0.2 10*3/uL (ref 0.0–0.7)
HCT: 40.6 % (ref 36.0–46.0)
HEMOGLOBIN: 13.5 g/dL (ref 12.0–15.0)
LYMPHS PCT: 26 % (ref 12–46)
Lymphs Abs: 1.6 10*3/uL (ref 0.7–4.0)
MCH: 30.4 pg (ref 26.0–34.0)
MCHC: 33.3 g/dL (ref 30.0–36.0)
MCV: 91.4 fL (ref 78.0–100.0)
MONO ABS: 0.4 10*3/uL (ref 0.1–1.0)
Monocytes Relative: 6 % (ref 3–12)
NEUTROS ABS: 4 10*3/uL (ref 1.7–7.7)
Neutrophils Relative %: 64 % (ref 43–77)
Platelets: 180 10*3/uL (ref 150–400)
RBC: 4.44 MIL/uL (ref 3.87–5.11)
RDW: 12.9 % (ref 11.5–15.5)
WBC: 6.2 10*3/uL (ref 4.0–10.5)

## 2014-07-20 LAB — COMPREHENSIVE METABOLIC PANEL
ALT: 19 U/L (ref 0–35)
AST: 22 U/L (ref 0–37)
Albumin: 3.7 g/dL (ref 3.5–5.2)
Alkaline Phosphatase: 56 U/L (ref 39–117)
Anion gap: 8 (ref 5–15)
BILIRUBIN TOTAL: 0.4 mg/dL (ref 0.3–1.2)
BUN: 14 mg/dL (ref 6–23)
CALCIUM: 8.8 mg/dL (ref 8.4–10.5)
CO2: 29 mmol/L (ref 19–32)
Chloride: 105 mmol/L (ref 96–112)
Creatinine, Ser: 0.9 mg/dL (ref 0.50–1.10)
GFR calc Af Amer: 74 mL/min — ABNORMAL LOW (ref >90)
GFR, EST NON AFRICAN AMERICAN: 64 mL/min — AB (ref >90)
Glucose, Bld: 92 mg/dL (ref 70–99)
POTASSIUM: 3.7 mmol/L (ref 3.5–5.1)
SODIUM: 142 mmol/L (ref 135–145)
Total Protein: 6.4 g/dL (ref 6.0–8.3)

## 2014-07-20 MED ORDER — CHLORHEXIDINE GLUCONATE 4 % EX LIQD: 1.0000 "application " | Freq: Once | CUTANEOUS | Status: DC

## 2014-07-27 MED ORDER — CEFAZOLIN SODIUM-DEXTROSE 2-3 GM-% IV SOLR
2.0000 g | INTRAVENOUS | Status: AC
  Administered 2014-07-28: 2 g via INTRAVENOUS
  Filled 2014-07-27: qty 50

## 2014-07-27 MED ORDER — HEPARIN SODIUM (PORCINE) 5000 UNIT/ML IJ SOLN
5000.0000 [IU] | Freq: Once | INTRAMUSCULAR | Status: AC
  Administered 2014-07-28: 5000 [IU] via SUBCUTANEOUS
  Filled 2014-07-27: qty 1

## 2014-07-28 ENCOUNTER — Encounter (HOSPITAL_COMMUNITY)
Admission: RE | Disposition: A | Discharge status: Home / Self Care | Payer: Self-pay | Source: Ambulatory Visit | Attending: General Surgery

## 2014-07-28 ENCOUNTER — Ambulatory Visit (HOSPITAL_COMMUNITY): Payer: Medicare HMO | Admitting: Certified Registered Nurse Anesthetist

## 2014-07-28 ENCOUNTER — Ambulatory Visit (HOSPITAL_COMMUNITY)
Admission: RE | Admit: 2014-07-28 | Discharge: 2014-07-28 | Disposition: A | Discharge status: Home / Self Care | Payer: Medicare HMO | Source: Ambulatory Visit | Attending: General Surgery | Admitting: General Surgery

## 2014-07-28 ENCOUNTER — Encounter (HOSPITAL_COMMUNITY): Payer: Self-pay | Admitting: *Deleted

## 2014-07-28 ENCOUNTER — Ambulatory Visit (HOSPITAL_COMMUNITY)
Admission: RE | Admit: 2014-07-28 | Discharge: 2014-07-29 | Disposition: A | Discharge status: Home / Self Care | Payer: Medicare HMO | Source: Ambulatory Visit | Attending: General Surgery | Admitting: General Surgery

## 2014-07-28 DIAGNOSIS — Z853 Personal history of malignant neoplasm of breast: Secondary | ICD-10-CM | POA: Insufficient documentation

## 2014-07-28 DIAGNOSIS — Z888 Allergy status to other drugs, medicaments and biological substances status: Secondary | ICD-10-CM | POA: Insufficient documentation

## 2014-07-28 DIAGNOSIS — Z23 Encounter for immunization: Secondary | ICD-10-CM | POA: Insufficient documentation

## 2014-07-28 DIAGNOSIS — C50412 Malignant neoplasm of upper-outer quadrant of left female breast: Secondary | ICD-10-CM | POA: Diagnosis present

## 2014-07-28 DIAGNOSIS — Z79899 Other long term (current) drug therapy: Secondary | ICD-10-CM | POA: Insufficient documentation

## 2014-07-28 DIAGNOSIS — C50912 Malignant neoplasm of unspecified site of left female breast: Secondary | ICD-10-CM | POA: Diagnosis present

## 2014-07-28 DIAGNOSIS — Z882 Allergy status to sulfonamides status: Secondary | ICD-10-CM | POA: Insufficient documentation

## 2014-07-28 DIAGNOSIS — Z170 Estrogen receptor positive status [ER+]: Secondary | ICD-10-CM

## 2014-07-28 HISTORY — PX: BREAST RECONSTRUCTION WITH PLACEMENT OF TISSUE EXPANDER AND FLEX HD (ACELLULAR HYDRATED DERMIS): SHX6295

## 2014-07-28 HISTORY — DX: Anxiety disorder, unspecified: F41.9

## 2014-07-28 HISTORY — DX: Malignant neoplasm of unspecified site of left female breast: C50.912

## 2014-07-28 HISTORY — PX: MASTECTOMY W/ SENTINEL NODE BIOPSY: SHX2001

## 2014-07-28 HISTORY — DX: Other chronic pain: G89.29

## 2014-07-28 HISTORY — PX: MASTECTOMY COMPLETE / SIMPLE W/ SENTINEL NODE BIOPSY: SUR846

## 2014-07-28 HISTORY — PX: RECONSTRUCTION BREAST IMMEDIATE / DELAYED W/ TISSUE EXPANDER: SUR1077

## 2014-07-28 HISTORY — DX: Malignant neoplasm of unspecified site of right female breast: C50.911

## 2014-07-28 HISTORY — DX: Dorsalgia, unspecified: M54.9

## 2014-07-28 SURGERY — MASTECTOMY WITH SENTINEL LYMPH NODE BIOPSY
Anesthesia: General | Laterality: Left

## 2014-07-28 MED ORDER — TECHNETIUM TC 99M SULFUR COLLOID FILTERED: 1.0000 | Freq: Once | INTRAVENOUS | Status: AC | PRN

## 2014-07-28 MED ORDER — HYDROMORPHONE HCL 1 MG/ML IJ SOLN: 0.2500 mg | INTRAMUSCULAR | Status: DC | PRN

## 2014-07-28 MED ORDER — 0.9 % SODIUM CHLORIDE (POUR BTL) OPTIME
TOPICAL | Status: DC | PRN
  Administered 2014-07-28: 1000 mL

## 2014-07-28 MED ORDER — DOCUSATE SODIUM 100 MG PO CAPS
100.0000 mg | ORAL_CAPSULE | Freq: Every day | ORAL | Status: DC
  Administered 2014-07-28 – 2014-07-29 (×2): 100 mg via ORAL
  Filled 2014-07-28 (×2): qty 1

## 2014-07-28 MED ORDER — ONDANSETRON HCL 4 MG/2ML IJ SOLN
INTRAMUSCULAR | Status: DC | PRN
  Administered 2014-07-28: 4 mg via INTRAVENOUS

## 2014-07-28 MED ORDER — FENTANYL CITRATE 0.05 MG/ML IJ SOLN
50.0000 ug | INTRAMUSCULAR | Status: DC | PRN
  Administered 2014-07-28 (×3): 50 ug via INTRAVENOUS
  Filled 2014-07-28 (×3): qty 2

## 2014-07-28 MED ORDER — NEOSTIGMINE METHYLSULFATE 10 MG/10ML IV SOLN
INTRAVENOUS | Status: DC | PRN
Start: 2014-07-28 — End: 2014-07-28
  Administered 2014-07-28: 4 mg via INTRAVENOUS

## 2014-07-28 MED ORDER — PROMETHAZINE HCL 25 MG/ML IJ SOLN: 6.2500 mg | INTRAMUSCULAR | Status: DC | PRN

## 2014-07-28 MED ORDER — ONDANSETRON HCL 4 MG/2ML IJ SOLN: 4.0000 mg | Freq: Four times a day (QID) | INTRAMUSCULAR | Status: DC | PRN

## 2014-07-28 MED ORDER — EPHEDRINE SULFATE 50 MG/ML IJ SOLN
INTRAMUSCULAR | Status: AC
  Filled 2014-07-28: qty 1

## 2014-07-28 MED ORDER — PROPOFOL 10 MG/ML IV BOLUS
INTRAVENOUS | Status: AC
  Filled 2014-07-28: qty 20

## 2014-07-28 MED ORDER — CITALOPRAM HYDROBROMIDE 20 MG PO TABS
10.0000 mg | ORAL_TABLET | Freq: Every day | ORAL | Status: DC
  Administered 2014-07-29: 10 mg via ORAL
  Filled 2014-07-28: qty 1

## 2014-07-28 MED ORDER — FENTANYL CITRATE 0.05 MG/ML IJ SOLN
INTRAMUSCULAR | Status: AC
Start: 2014-07-28 — End: 2014-07-28
  Filled 2014-07-28: qty 5

## 2014-07-28 MED ORDER — VECURONIUM BROMIDE 10 MG IV SOLR
INTRAVENOUS | Status: AC
  Filled 2014-07-28: qty 10

## 2014-07-28 MED ORDER — STERILE WATER FOR INJECTION IJ SOLN
INTRAMUSCULAR | Status: AC
  Filled 2014-07-28: qty 10

## 2014-07-28 MED ORDER — LIDOCAINE HCL (CARDIAC) 20 MG/ML IV SOLN
INTRAVENOUS | Status: DC | PRN
  Administered 2014-07-28: 20 mg via INTRAVENOUS

## 2014-07-28 MED ORDER — FENTANYL CITRATE 0.05 MG/ML IJ SOLN
INTRAMUSCULAR | Status: AC
  Filled 2014-07-28: qty 5

## 2014-07-28 MED ORDER — FUROSEMIDE 40 MG PO TABS
40.0000 mg | ORAL_TABLET | Freq: Every day | ORAL | Status: DC
  Administered 2014-07-28 – 2014-07-29 (×2): 40 mg via ORAL
  Filled 2014-07-28 (×2): qty 1

## 2014-07-28 MED ORDER — LACTATED RINGERS IV SOLN
INTRAVENOUS | Status: DC | PRN
  Administered 2014-07-28 (×3): via INTRAVENOUS

## 2014-07-28 MED ORDER — PNEUMOCOCCAL VAC POLYVALENT 25 MCG/0.5ML IJ INJ
0.5000 mL | INJECTION | INTRAMUSCULAR | Status: AC
  Administered 2014-07-29: 0.5 mL via INTRAMUSCULAR

## 2014-07-28 MED ORDER — TAMOXIFEN CITRATE 10 MG PO TABS
20.0000 mg | ORAL_TABLET | Freq: Every day | ORAL | Status: DC
  Administered 2014-07-28 – 2014-07-29 (×2): 20 mg via ORAL
  Filled 2014-07-28 (×2): qty 2

## 2014-07-28 MED ORDER — GLYCOPYRROLATE 0.2 MG/ML IJ SOLN
INTRAMUSCULAR | Status: DC | PRN
  Administered 2014-07-28: 0.2 mg via INTRAVENOUS
  Administered 2014-07-28: 0.6 mg via INTRAVENOUS

## 2014-07-28 MED ORDER — DEXAMETHASONE SODIUM PHOSPHATE 4 MG/ML IJ SOLN
INTRAMUSCULAR | Status: AC
  Filled 2014-07-28: qty 2

## 2014-07-28 MED ORDER — ROCURONIUM BROMIDE 50 MG/5ML IV SOLN
INTRAVENOUS | Status: AC
  Filled 2014-07-28: qty 1

## 2014-07-28 MED ORDER — METHOCARBAMOL 500 MG PO TABS
500.0000 mg | ORAL_TABLET | Freq: Four times a day (QID) | ORAL | Status: DC
  Administered 2014-07-28 – 2014-07-29 (×5): 500 mg via ORAL
  Filled 2014-07-28 (×6): qty 1

## 2014-07-28 MED ORDER — VECURONIUM BROMIDE 10 MG IV SOLR
INTRAVENOUS | Status: DC | PRN
  Administered 2014-07-28: 1 mg via INTRAVENOUS
  Administered 2014-07-28 (×2): 2 mg via INTRAVENOUS

## 2014-07-28 MED ORDER — ONDANSETRON HCL 4 MG PO TABS: 4.0000 mg | ORAL_TABLET | Freq: Four times a day (QID) | ORAL | Status: DC | PRN

## 2014-07-28 MED ORDER — MIDAZOLAM HCL 5 MG/5ML IJ SOLN
INTRAMUSCULAR | Status: DC | PRN
  Administered 2014-07-28: 1 mg via INTRAVENOUS

## 2014-07-28 MED ORDER — EPHEDRINE SULFATE 50 MG/ML IJ SOLN
INTRAMUSCULAR | Status: DC | PRN
  Administered 2014-07-28: 10 mg via INTRAVENOUS
  Administered 2014-07-28: 5 mg via INTRAVENOUS

## 2014-07-28 MED ORDER — ROCURONIUM BROMIDE 100 MG/10ML IV SOLN
INTRAVENOUS | Status: DC | PRN
  Administered 2014-07-28: 50 mg via INTRAVENOUS

## 2014-07-28 MED ORDER — PROPOFOL 10 MG/ML IV BOLUS
INTRAVENOUS | Status: DC | PRN
  Administered 2014-07-28: 130 mg via INTRAVENOUS

## 2014-07-28 MED ORDER — MIDAZOLAM HCL 2 MG/2ML IJ SOLN: 0.5000 mg | Freq: Once | INTRAMUSCULAR | Status: DC | PRN

## 2014-07-28 MED ORDER — ONDANSETRON HCL 4 MG/2ML IJ SOLN
INTRAMUSCULAR | Status: AC
  Filled 2014-07-28: qty 2

## 2014-07-28 MED ORDER — CEFAZOLIN SODIUM 1-5 GM-% IV SOLN
1.0000 g | Freq: Four times a day (QID) | INTRAVENOUS | Status: DC
  Administered 2014-07-28 – 2014-07-29 (×3): 1 g via INTRAVENOUS
  Filled 2014-07-28 (×5): qty 50

## 2014-07-28 MED ORDER — NEOSTIGMINE METHYLSULFATE 10 MG/10ML IV SOLN
INTRAVENOUS | Status: AC
  Filled 2014-07-28: qty 3

## 2014-07-28 MED ORDER — KCL IN DEXTROSE-NACL 20-5-0.9 MEQ/L-%-% IV SOLN
INTRAVENOUS | Status: DC
  Administered 2014-07-28 – 2014-07-29 (×2): via INTRAVENOUS
  Filled 2014-07-28 (×4): qty 1000

## 2014-07-28 MED ORDER — GLYCOPYRROLATE 0.2 MG/ML IJ SOLN
INTRAMUSCULAR | Status: AC
  Filled 2014-07-28: qty 3

## 2014-07-28 MED ORDER — MEPERIDINE HCL 25 MG/ML IJ SOLN: 6.2500 mg | INTRAMUSCULAR | Status: DC | PRN

## 2014-07-28 MED ORDER — SODIUM CHLORIDE 0.9 % IV SOLN
Freq: Once | INTRAVENOUS | Status: AC
  Administered 2014-07-28: 1000 mL
  Filled 2014-07-28: qty 1

## 2014-07-28 MED ORDER — MIDAZOLAM HCL 2 MG/2ML IJ SOLN
INTRAMUSCULAR | Status: AC
  Filled 2014-07-28: qty 2

## 2014-07-28 MED ORDER — INFLUENZA VAC SPLIT QUAD 0.5 ML IM SUSY
0.5000 mL | PREFILLED_SYRINGE | INTRAMUSCULAR | Status: AC
  Administered 2014-07-29: 0.5 mL via INTRAMUSCULAR
  Filled 2014-07-28: qty 0.5

## 2014-07-28 MED ORDER — HEPARIN SODIUM (PORCINE) 5000 UNIT/ML IJ SOLN
5000.0000 [IU] | Freq: Three times a day (TID) | INTRAMUSCULAR | Status: DC
  Administered 2014-07-29: 5000 [IU] via SUBCUTANEOUS

## 2014-07-28 MED ORDER — FENTANYL CITRATE 0.05 MG/ML IJ SOLN
INTRAMUSCULAR | Status: DC | PRN
Start: 2014-07-28 — End: 2014-07-28
  Administered 2014-07-28 (×3): 50 ug via INTRAVENOUS
  Administered 2014-07-28: 200 ug via INTRAVENOUS
  Administered 2014-07-28 (×2): 25 ug via INTRAVENOUS
  Administered 2014-07-28: 50 ug via INTRAVENOUS

## 2014-07-28 MED ORDER — OXYCODONE-ACETAMINOPHEN 5-325 MG PO TABS
1.0000 | ORAL_TABLET | ORAL | Status: DC | PRN
  Administered 2014-07-29: 2 via ORAL
  Filled 2014-07-28: qty 2

## 2014-07-28 MED ORDER — DEXAMETHASONE SODIUM PHOSPHATE 4 MG/ML IJ SOLN
INTRAMUSCULAR | Status: DC | PRN
  Administered 2014-07-28: 8 mg via INTRAVENOUS

## 2014-07-28 MED ORDER — LIDOCAINE HCL (CARDIAC) 20 MG/ML IV SOLN
INTRAVENOUS | Status: AC
  Filled 2014-07-28: qty 5

## 2014-07-28 SURGICAL SUPPLY — 78 items
ADH SKN CLS APL DERMABOND .7 (GAUZE/BANDAGES/DRESSINGS) ×1
APPLIER CLIP 9.375 MED OPEN (MISCELLANEOUS) ×2
APR CLP MED 9.3 20 MLT OPN (MISCELLANEOUS) ×1
ATCH SMKEVC FLXB CAUT HNDSWH (FILTER) ×1 IMPLANT
BAG DECANTER FOR FLEXI CONT (MISCELLANEOUS) ×2 IMPLANT
BINDER BREAST LRG (GAUZE/BANDAGES/DRESSINGS) IMPLANT
BINDER BREAST XLRG (GAUZE/BANDAGES/DRESSINGS) IMPLANT
BINDER BREAST XXLRG (GAUZE/BANDAGES/DRESSINGS) ×2 IMPLANT
BIOPATCH RED 1 DISK 7.0 (GAUZE/BANDAGES/DRESSINGS) ×4 IMPLANT
BLADE 10 SAFETY STRL DISP (BLADE) ×2 IMPLANT
CANISTER SUCTION 2500CC (MISCELLANEOUS) ×6 IMPLANT
CHLORAPREP W/TINT 26ML (MISCELLANEOUS) ×4 IMPLANT
CLIP APPLIE 9.375 MED OPEN (MISCELLANEOUS) ×1 IMPLANT
CONT SPEC 4OZ CLIKSEAL STRL BL (MISCELLANEOUS) ×2 IMPLANT
COVER PROBE W GEL 5X96 (DRAPES) ×2 IMPLANT
COVER SURGICAL LIGHT HANDLE (MISCELLANEOUS) ×4 IMPLANT
DERMABOND ADVANCED (GAUZE/BANDAGES/DRESSINGS) ×1
DERMABOND ADVANCED .7 DNX12 (GAUZE/BANDAGES/DRESSINGS) ×1 IMPLANT
DEVICE DISSECT PLASMABLAD 3.0S (MISCELLANEOUS) ×1 IMPLANT
DRAIN CHANNEL 19F RND (DRAIN) ×6 IMPLANT
DRAPE LAPAROSCOPIC ABDOMINAL (DRAPES) ×2 IMPLANT
DRAPE ORTHO SPLIT 77X108 STRL (DRAPES) ×4
DRAPE PROXIMA HALF (DRAPES) ×8 IMPLANT
DRAPE SURG 17X23 STRL (DRAPES) ×10 IMPLANT
DRAPE SURG ORHT 6 SPLT 77X108 (DRAPES) ×2 IMPLANT
DRAPE UTILITY XL STRL (DRAPES) ×4 IMPLANT
DRAPE WARM FLUID 44X44 (DRAPE) ×2 IMPLANT
DRSG PAD ABDOMINAL 8X10 ST (GAUZE/BANDAGES/DRESSINGS) ×4 IMPLANT
DRSG SORBAVIEW 3.5X5-5/16 MED (GAUZE/BANDAGES/DRESSINGS) ×4 IMPLANT
ELECT BLADE 6.5 EXT (BLADE) IMPLANT
ELECT CAUTERY BLADE 6.4 (BLADE) ×4 IMPLANT
ELECT REM PT RETURN 9FT ADLT (ELECTROSURGICAL) ×4
ELECTRODE REM PT RTRN 9FT ADLT (ELECTROSURGICAL) ×2 IMPLANT
EVACUATOR SILICONE 100CC (DRAIN) ×6 IMPLANT
EVACUATOR SMOKE ACCUVAC VALLEY (FILTER) ×1
EXPANDER BREAST CONT 800CC (Breast) ×2 IMPLANT
GAUZE SPONGE 4X4 12PLY STRL (GAUZE/BANDAGES/DRESSINGS) ×2 IMPLANT
GAUZE XEROFORM 5X9 LF (GAUZE/BANDAGES/DRESSINGS) ×2 IMPLANT
GLOVE BIO SURGEON STRL SZ7.5 (GLOVE) ×4 IMPLANT
GLOVE BIOGEL PI IND STRL 6 (GLOVE) ×1 IMPLANT
GLOVE BIOGEL PI IND STRL 8 (GLOVE) ×1 IMPLANT
GLOVE BIOGEL PI INDICATOR 6 (GLOVE) ×1
GLOVE BIOGEL PI INDICATOR 8 (GLOVE) ×1
GLOVE SURG SS PI 7.0 STRL IVOR (GLOVE) ×12 IMPLANT
GOWN STRL REUS W/ TWL LRG LVL3 (GOWN DISPOSABLE) ×3 IMPLANT
GOWN STRL REUS W/ TWL XL LVL3 (GOWN DISPOSABLE) ×1 IMPLANT
GOWN STRL REUS W/TWL LRG LVL3 (GOWN DISPOSABLE) ×6
GOWN STRL REUS W/TWL XL LVL3 (GOWN DISPOSABLE) ×2
KIT BASIN OR (CUSTOM PROCEDURE TRAY) ×4 IMPLANT
KIT ROOM TURNOVER OR (KITS) ×4 IMPLANT
LIQUID BAND (GAUZE/BANDAGES/DRESSINGS) ×4 IMPLANT
MARKER SKIN DUAL TIP RULER LAB (MISCELLANEOUS) ×2 IMPLANT
NEEDLE 18GX1X1/2 (RX/OR ONLY) (NEEDLE) IMPLANT
NEEDLE 21 GA WING INFUSION (NEEDLE) ×2 IMPLANT
NEEDLE HYPO 25GX1X1/2 BEV (NEEDLE) IMPLANT
NS IRRIG 1000ML POUR BTL (IV SOLUTION) ×6 IMPLANT
PACK GENERAL/GYN (CUSTOM PROCEDURE TRAY) ×4 IMPLANT
PAD ARMBOARD 7.5X6 YLW CONV (MISCELLANEOUS) ×4 IMPLANT
PLASMABLADE 3.0S (MISCELLANEOUS) ×2
PREFILTER EVAC NS 1 1/3-3/8IN (MISCELLANEOUS) ×2 IMPLANT
SET ASEPTIC TRANSFER (MISCELLANEOUS) ×2 IMPLANT
SPECIMEN JAR X LARGE (MISCELLANEOUS) ×2 IMPLANT
STAPLER VISISTAT 35W (STAPLE) ×2 IMPLANT
SUT ETHILON 3 0 FSL (SUTURE) ×2 IMPLANT
SUT MNCRL AB 3-0 PS2 18 (SUTURE) ×8 IMPLANT
SUT MON AB 4-0 PC3 18 (SUTURE) ×2 IMPLANT
SUT PDS AB 3-0 SH 27 (SUTURE) IMPLANT
SUT PROLENE 3 0 PS 2 (SUTURE) ×4 IMPLANT
SUT VIC AB 3-0 54X BRD REEL (SUTURE) IMPLANT
SUT VIC AB 3-0 BRD 54 (SUTURE)
SUT VIC AB 3-0 SH 18 (SUTURE) ×6 IMPLANT
SYR BULB IRRIGATION 50ML (SYRINGE) ×2 IMPLANT
SYR CONTROL 10ML LL (SYRINGE) IMPLANT
TOWEL OR 17X24 6PK STRL BLUE (TOWEL DISPOSABLE) ×4 IMPLANT
TOWEL OR 17X26 10 PK STRL BLUE (TOWEL DISPOSABLE) ×4 IMPLANT
TRAY FOLEY CATH 14FRSI W/METER (CATHETERS) IMPLANT
TRAY FOLEY CATH 16FRSI W/METER (SET/KITS/TRAYS/PACK) ×2 IMPLANT
TUBE CONNECTING 12X1/4 (SUCTIONS) ×4 IMPLANT

## 2014-07-28 NOTE — Anesthesia Preprocedure Evaluation (Addendum)
Anesthesia Evaluation  Patient identified by MRN, date of birth, ID band Patient awake    Reviewed: Allergy & Precautions, NPO status , Patient's Chart, lab work & pertinent test results  History of Anesthesia Complications Negative for: history of anesthetic complications  Airway Mallampati: II  TM Distance: >3 FB Neck ROM: Full    Dental  (+) Dental Advisory Given, Teeth Intact   Pulmonary shortness of breath and with exertion,  breath sounds clear to auscultation        Cardiovascular hypertension, Rhythm:Regular Rate:Normal  '09 EF 65%   Neuro/Psych  Headaches, PSYCHIATRIC DISORDERS Depression    GI/Hepatic Neg liver ROS, hiatal hernia, GERD-  Medicated and Controlled,  Endo/Other  negative endocrine ROS  Renal/GU negative Renal ROS     Musculoskeletal  (+) Arthritis -,   Abdominal   Peds  Hematology negative hematology ROS (+)   Anesthesia Other Findings Breast cancer  Reproductive/Obstetrics                          Anesthesia Physical Anesthesia Plan  ASA: III  Anesthesia Plan: General   Post-op Pain Management:    Induction: Intravenous  Airway Management Planned: Oral ETT  Additional Equipment:   Intra-op Plan:   Post-operative Plan: Extubation in OR  Informed Consent: I have reviewed the patients History and Physical, chart, labs and discussed the procedure including the risks, benefits and alternatives for the proposed anesthesia with the patient or authorized representative who has indicated his/her understanding and acceptance.   Dental advisory given  Plan Discussed with: Anesthesiologist, CRNA and Surgeon  Anesthesia Plan Comments: (Plan routine monitors, GETA)       Anesthesia Quick Evaluation

## 2014-07-28 NOTE — Anesthesia Procedure Notes (Signed)
Procedure Name: Intubation Date/Time: 07/28/2014 7:43 AM Performed by: Garrison Columbus T Pre-anesthesia Checklist: Patient identified, Emergency Drugs available, Suction available and Patient being monitored Patient Re-evaluated:Patient Re-evaluated prior to inductionOxygen Delivery Method: Circle system utilized Preoxygenation: Pre-oxygenation with 100% oxygen Intubation Type: IV induction Ventilation: Mask ventilation without difficulty Laryngoscope Size: Miller and 2 Grade View: Grade II Tube type: Oral Tube size: 7.5 mm Number of attempts: 1 Airway Equipment and Method: Stylet Placement Confirmation: ETT inserted through vocal cords under direct vision,  positive ETCO2 and breath sounds checked- equal and bilateral Secured at: 22 cm Tube secured with: Tape Dental Injury: Teeth and Oropharynx as per pre-operative assessment

## 2014-07-28 NOTE — Op Note (Signed)
07/28/2014  9:54 AM  PATIENT:  [name]  69 y.o. female  PRE-OPERATIVE DIAGNOSIS:  Left Breast Cancer  POST-OPERATIVE DIAGNOSIS:  Left Breast Cancer  PROCEDURE:  Procedure(s): LEFT MASTECTOMY WITH SENTINEL LYMPH NODE MAPPING (Left) LEFT BREAST RECONSTRUCTION PLACEMENT OF LEFT TISSUE EXPANDER  (Left)  SURGEON:  Surgeon(s) and Role: Panel 1:    * Jovita Kussmaul, MD - Primary  Panel 2:    * Crissie Reese, MD - Primary  PHYSICIAN ASSISTANT:   ASSISTANTS: none   ANESTHESIA:   general  EBL:  Total I/O In: 1000 [I.V.:1000] Out: 60 [Urine:235; Blood:50]  BLOOD ADMINISTERED:none  DRAINS: none   LOCAL MEDICATIONS USED:  NONE  SPECIMEN:  Source of Specimen:  left mastectomy and sentinel node  DISPOSITION OF SPECIMEN:  PATHOLOGY  COUNTS:  YES  TOURNIQUET:  * No tourniquets in log *  DICTATION: .Dragon Dictation  After informed consent was obtained the patient was brought to the operating room and placed in the supine position on the operating room table. After adequate induction of general anesthesia the patient's bilateral breast, chest, and axillary areas were prepped with ChloraPrep, allowed to dry, and draped in usual sterile manner. An elliptical incision was made around the left breast nipple and areola complex in order to save as much skin as possible. This incision was carried through the skin and subcutaneous tissue sharply with the plasma blade. Earlier in the day the patient underwent injection of 1 mCi of technetium sulfur colloid in the subareolar position on the left. Breast ducts were then used to elevate the skin flaps anteriorly towards the ceiling. Thin skin flaps were created circumferentially around the incision between the breast tissue in the subcutaneous fat. This dissection was carried all the way to the chest wall. Laterally when the dissection reached the edge of the axilla and the neoprobe was used to identify the hot spot in the left axilla. The hot  lymph node was identified by blunt hemostat dissection. This lymph node was excised by combination of sharp dissection with the plasma blade and some lymphatics and small vessels were controlled with clips. Ex vivo counts on the sentinel node were approximately 1100. No other hot or palpable lymph nodes were identified in the left axilla. The sentinel node was sent for touch preps which were negative. At this point the breast was removed from the pectoralis muscle with the pectoralis fascia in a top-down manner. This was also done sharply with the plasma blade. A couple of small vessels were controlled medially with figure-of-eight 3-0 Vicryl stitches. Once the breast was removed it was oriented with a stitch on the lateral aspect and sent pathology for further evaluation. Wound was irrigated with copious amounts of saline. The wound was examined and found to be hemostatic. The wound was then packed with a moistened lap sponge and covered with a sterile towel. The patient had tolerated this portion of the procedure well. She was hemodynamically stable. At this point the operation was turned over to Dr. Harlow Mares for the reconstruction and his portion will be dictated separately. At the end of my portion of the case all needle sponge and instrument counts were correct.  PLAN OF CARE: Admit for overnight observation  PATIENT DISPOSITION:  PACU - hemodynamically stable.   Delay start of Pharmacological VTE agent (>24hrs) due to surgical blood loss or risk of bleeding: no

## 2014-07-28 NOTE — Anesthesia Postprocedure Evaluation (Signed)
  Anesthesia Post-op Note  Patient: [name]  Procedure(s) Performed: Procedure(s): LEFT MASTECTOMY WITH SENTINEL LYMPH NODE MAPPING (Left) LEFT BREAST RECONSTRUCTION PLACEMENT OF LEFT TISSUE EXPANDER  (Left)  Patient Location: PACU  Anesthesia Type:General  Level of Consciousness: awake, alert , oriented and patient cooperative  Airway and Oxygen Therapy: Patient Spontanous Breathing  Post-op Pain: none  Post-op Assessment: Post-op Vital signs reviewed, Patient's Cardiovascular Status Stable, Respiratory Function Stable, Patent Airway, No signs of Nausea or vomiting and Pain level controlled  Post-op Vital Signs: Reviewed and stable  Last Vitals:  Filed Vitals:   07/28/14 1219  BP:   Pulse: 70  Temp:   Resp: 16    Complications: No apparent anesthesia complications

## 2014-07-28 NOTE — Brief Op Note (Signed)
07/28/2014  11:23 AM  PATIENT:  [name]  69 y.o. female  PRE-OPERATIVE DIAGNOSIS:  Left Breast Cancer  POST-OPERATIVE DIAGNOSIS:  Left Breast Cancer  PROCEDURE:  Procedure(s): LEFT MASTECTOMY WITH SENTINEL LYMPH NODE MAPPING (Left) LEFT BREAST RECONSTRUCTION PLACEMENT OF LEFT TISSUE EXPANDER  (Left)  SURGEON:  Surgeon(s) and Role: Panel 1:    * Jovita Kussmaul, MD - Primary  Panel 2:    * Crissie Reese, MD - Primary  PHYSICIAN ASSISTANT:   ASSISTANTS: Tina, RNFA   ANESTHESIA:   general  EBL:  Total I/O In: 2000 [I.V.:2000] Out: 485 [Urine:385; Blood:100]  BLOOD ADMINISTERED:none  DRAINS: (2) Jackson-Pratt drain(s) with closed bulb suction in the left mastectomy space   LOCAL MEDICATIONS USED:  NONE  SPECIMEN:  No Specimen  DISPOSITION OF SPECIMEN:  N/A  COUNTS:  YES  TOURNIQUET:  * No tourniquets in log *  DICTATION: .Other Dictation: Dictation Number [id number]  PLAN OF CARE: Admit for overnight observation  PATIENT DISPOSITION:  PACU - hemodynamically stable.   Delay start of Pharmacological VTE agent (>24hrs) due to surgical blood loss or risk of bleeding: no

## 2014-07-28 NOTE — Transfer of Care (Signed)
Immediate Anesthesia Transfer of Care Note  Patient: [name]  Procedure(s) Performed: Procedure(s): LEFT MASTECTOMY WITH SENTINEL LYMPH NODE MAPPING (Left) LEFT BREAST RECONSTRUCTION PLACEMENT OF LEFT TISSUE EXPANDER  (Left)  Patient Location: PACU  Anesthesia Type:General  Level of Consciousness: awake and alert   Airway & Oxygen Therapy: Patient Spontanous Breathing and Patient connected to nasal cannula oxygen  Post-op Assessment: Report given to RN, Post -op Vital signs reviewed and stable and Patient moving all extremities X 4  Post vital signs: Reviewed and stable  Last Vitals:  Filed Vitals:   07/28/14 0546  BP: 160/69  Pulse: 64  Temp: 36.9 C  Resp: 20    Complications: No apparent anesthesia complications

## 2014-07-28 NOTE — H&P (Signed)
[name] 06/20/2014 2:41 PM Location: [facility] Patient #: [id number] DOB: 1946/01/29 Married / Language: English / Race: White Female  History of Present Illness [name]. [name] Starks MD; 06/20/2014 4:41 PM) Patient words: new lump right breast.  The patient is a 69 year old female who presents with a breast mass. 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 She is scheduled for left mastectomy with reconstruction on March 3. She also has a history of right breast cancer for which she underwent modified radical mastectomy with TRAM reconstruction in 1996. Since she was last seen she has developed some swelling and tenderness in the upper portion of the right reconstructed breast. She denies any fevers or chills. She denies any drainage.   Allergies ([name], LPN; 6/76/7209 4:70 PM) Codeine Phosphate *ANALGESICS - OPIOID* Rash. ACE Inhibitors Sulfa Antibiotics No Known Drug Allergies10/15/2015 (Marked as Inactive)  Medication History ([name], LPN; 9/62/8366 2:94 PM) PROzac (10MG  Capsule, Oral) Active. Lasix (40MG  Tablet, Oral) Active. Ibuprofen IB (200MG  Tablet, Oral) Active. Red Yeast Rice (600MG  Tablet, Oral) Active. Tylenol Extra Strength (500MG  Tablet, Oral daily) Active. Probiotic Daily (Oral) Active.  Review of Systems [name] S. [name] Starks MD; 06/20/2014 4:41 PM) General Not Present- Appetite Loss, Chills, Fatigue, Fever, Night Sweats, Weight Gain and Weight Loss. Skin Not Present- Change in Wart/Mole, Dryness, Hives, Jaundice, New Lesions, Non-Healing Wounds, Rash and Ulcer. HEENT Not Present- Earache, Hearing Loss, Hoarseness, Nose Bleed, Oral Ulcers, Ringing in the Ears, Seasonal Allergies, Sinus Pain, Sore Throat, Visual Disturbances, Wears glasses/contact lenses and Yellow Eyes. Respiratory Not Present- Bloody sputum, Chronic Cough, Difficulty Breathing,  Snoring and Wheezing. Breast Not Present- Breast Mass, Breast Pain, Nipple Discharge and Skin Changes. Cardiovascular Not Present- Chest Pain, Difficulty Breathing Lying Down, Leg Cramps, Palpitations, Rapid Heart Rate, Shortness of Breath and Swelling of Extremities. Gastrointestinal Not Present- Abdominal Pain, Bloating, Bloody Stool, Change in Bowel Habits, Chronic diarrhea, Constipation, Difficulty Swallowing, Excessive gas, Gets full quickly at meals, Hemorrhoids, Indigestion, Nausea, Rectal Pain and Vomiting. Female Genitourinary Not Present- Frequency, Nocturia, Painful Urination, Pelvic Pain and Urgency. Musculoskeletal Not Present- Back Pain, Joint Pain, Joint Stiffness, Muscle Pain, Muscle Weakness and Swelling of Extremities. Neurological Not Present- Decreased Memory, Fainting, Headaches, Numbness, Seizures, Tingling, Tremor, Trouble walking and Weakness. Psychiatric Not Present- Anxiety, Bipolar, Change in Sleep Pattern, Depression, Fearful and Frequent crying. Endocrine Not Present- Cold Intolerance, Excessive Hunger, Hair Changes, Heat Intolerance, Hot flashes and New Diabetes. Hematology Not Present- Easy Bruising, Excessive bleeding, Gland problems, HIV and Persistent Infections.   Vitals ([name] LPN; 7/65/4650 3:54 PM) 06/20/2014 2:42 PM Weight: 187.2 lb Height: 67in Body Surface Area: 2 m Body Mass Index: 29.32 kg/m Temp.: 98.53F(Oral)  Pulse: 82 (Regular)  BP: 154/80 (Sitting, Left Arm, Standard)    Physical Exam [name] S. [name] Starks MD; 06/20/2014 4:43 PM) General Mental Status-Alert. General Appearance-Consistent with stated age. Hydration-Well hydrated. Voice-Normal.  Head and Neck Head-normocephalic, atraumatic with no lesions or palpable masses. Trachea-midline. Thyroid Gland Characteristics - normal size and consistency.  Eye Eyeball - Bilateral-Extraocular movements intact. Sclera/Conjunctiva - Bilateral-No scleral  icterus.  Chest and Lung Exam Chest and lung exam reveals -quiet, even and easy respiratory effort with no use of accessory muscles and on auscultation, normal breath sounds, no adventitious sounds and normal vocal resonance. Inspection Chest Wall - Normal. Back - normal.  Breast Note: There is some faint palpable swelling of the  upper portion of the right reconstructed breast. She has some point tenderness on the lateral upper right chest wall. There may be a small area of palpable induration in this location but it is difficult to appreciate. There is no palpable mass that corresponds to the generalized swelling. There is no palpable axillary, supraclavicular, or cervical lymphadenopathy   Cardiovascular Cardiovascular examination reveals -normal heart sounds, regular rate and rhythm with no murmurs and normal pedal pulses bilaterally.  Abdomen Inspection Inspection of the abdomen reveals - No Hernias. Skin - Scar - no surgical scars. Palpation/Percussion Palpation and Percussion of the abdomen reveal - Soft, Non Tender, No Rebound tenderness, No Rigidity (guarding) and No hepatosplenomegaly. Auscultation Auscultation of the abdomen reveals - Bowel sounds normal.  Neurologic Neurologic evaluation reveals -alert and oriented x 3 with no impairment of recent or remote memory. Mental Status-Normal.  Musculoskeletal Normal Exam - Left-Upper Extremity Strength Normal and Lower Extremity Strength Normal. Normal Exam - Right-Upper Extremity Strength Normal and Lower Extremity Strength Normal.  Lymphatic Head & Neck  General Head & Neck Lymphatics: Bilateral - Description - Normal. Axillary  General Axillary Region: Bilateral - Description - Normal. Tenderness - Non Tender. Femoral & Inguinal  Generalized Femoral & Inguinal Lymphatics: Bilateral - Description - Normal. Tenderness - Non Tender.    Assessment & Plan [name] S. [name] Starks MD; 06/20/2014 3:02 PM) PRIMARY CANCER  OF UPPER OUTER QUADRANT OF LEFT FEMALE BREAST (174.4  C50.412) BREAST MASS, RIGHT (611.72  N63) Impression: The patient was recently diagnosed with a left-sided breast cancer for which she has chosen mastectomy with reconstruction. Since that time she has developed some swelling in the upper portion of the right reconstructed breast with point tenderness in the upper outer quadrant. At this point I would recommend evaluation of the right reconstructed breast with mammogram and ultrasound. We will call her with the results of the study. She is scheduled for definitive surgery on the left side on March 3.     Signed by Luella Cook, MD (06/20/2014 4:43 PM)

## 2014-07-28 NOTE — Interval H&P Note (Signed)
History and Physical Interval Note:  07/28/2014 7:16 AM  [name]  has presented today for surgery, with the diagnosis of Left Breast Cancer  The various methods of treatment have been discussed with the patient and family. After consideration of risks, benefits and other options for treatment, the patient has consented to  Procedure(s): LEFT MASTECTOMY WITH SENTINEL LYMPH NODE MAPPING (Left) LEFT BREAST RECONSTRUCTION PLACEMENT OF LEFT TISSUE EXPANDER AND ACE CELLULAR DERMAL MATRIX  (Left) as a surgical intervention .  The patient's history has been reviewed, patient examined, no change in status, stable for surgery.  I have reviewed the patient's chart and labs.  Questions were answered to the patient's satisfaction.     TOTH III,PAUL S

## 2014-07-29 ENCOUNTER — Encounter (HOSPITAL_COMMUNITY): Payer: Self-pay | Admitting: General Surgery

## 2014-07-29 DIAGNOSIS — C50412 Malignant neoplasm of upper-outer quadrant of left female breast: Secondary | ICD-10-CM | POA: Diagnosis not present

## 2014-07-29 MED ORDER — ENOXAPARIN SODIUM 40 MG/0.4ML ~~LOC~~ SOLN: 40.0000 mg | Freq: Every day | SUBCUTANEOUS | Status: DC

## 2014-07-29 MED ORDER — DIPHENHYDRAMINE HCL 25 MG PO CAPS
25.0000 mg | ORAL_CAPSULE | Freq: Four times a day (QID) | ORAL | Status: DC | PRN
  Administered 2014-07-29: 25 mg via ORAL
  Filled 2014-07-29: qty 1

## 2014-07-29 MED ORDER — METHOCARBAMOL 500 MG PO TABS: 500.0000 mg | ORAL_TABLET | Freq: Four times a day (QID) | ORAL | Status: DC

## 2014-07-29 MED ORDER — DOXYCYCLINE HYCLATE 50 MG PO CAPS: 50.0000 mg | ORAL_CAPSULE | Freq: Two times a day (BID) | ORAL | Status: DC

## 2014-07-29 MED ORDER — OXYCODONE-ACETAMINOPHEN 5-325 MG PO TABS: 1.0000 | ORAL_TABLET | ORAL | Status: DC | PRN

## 2014-07-29 MED ORDER — ENOXAPARIN SODIUM 40 MG/0.4ML ~~LOC~~ SOLN
40.0000 mg | Freq: Every day | SUBCUTANEOUS | Status: DC
  Administered 2014-07-29: 40 mg via SUBCUTANEOUS
  Filled 2014-07-29: qty 0.4

## 2014-07-29 MED ORDER — DOCUSATE SODIUM 100 MG PO CAPS: 100.0000 mg | ORAL_CAPSULE | Freq: Every day | ORAL | Status: DC

## 2014-07-29 NOTE — Progress Notes (Signed)
Discharge home. Home discharge instruction given to pateint,no questions verbalized.

## 2014-07-29 NOTE — Discharge Instructions (Addendum)
No lifting for 6 weeks No vigorous activity for 6 weeks (including outdoor walks) No driving for 4 weeks OK to walk up stairs slowly Stay propped up Use incentive spirometer at home every hour while awake No shower while drains are in place Empty drains at least three times a day and record the amounts separately Change drain dressings every third day if instructed to do so by Dr. Harlow [name]  Apply Bacitracin antibiotic ointment to the drain sites  Place gauze dressing over drains  Secure the gauze with tape Take an over-the-counter Probiotic while on antibiotics Take an over-the-counter stool softener (such as Colace) while on pain medication See Dr. Harlow [name] next week For questions call [phone] or [phone]

## 2014-07-29 NOTE — Op Note (Signed)
NAMESHARAH, [name]                ACCOUNT NO.:  [id number]  MEDICAL RECORD NO.:  [id number]  LOCATION:  [facility]                          FACILITY:  [facility]  PHYSICIAN:  Crissie Reese, M.D.     DATE OF BIRTH:  1945/10/24  DATE OF PROCEDURE:  07/28/2014 DATE OF DISCHARGE:                              OPERATIVE REPORT   PREOPERATIVE DIAGNOSIS:  Left breast cancer.  POSTOPERATIVE DIAGNOSIS:  Left breast cancer.  PROCEDURE PERFORMED:  Breast reconstruction of left side, immediate with tissue expander.  SURGEON:  Crissie Reese, M.D.  ANESTHESIA:  General.  ESTIMATED BLOOD LOSS:  5 mL.  DRAINS:  Two 19-French drains.  CLINICAL NOTE:  A 69 year old woman has left breast cancer and desires reconstruction at the time of mastectomy.  She understood this to be a staged procedure when she selected tissue expander followed by eventual placement of an implant.  The nature of this procedure and risks and possible complications were discussed with her in detail.  Options regarding type of mastectomy were discussed, but it was decided that she should have a standard mastectomy for her particular tumor.  She understood the nature of the procedure and risks that include, but not limited to, bleeding, infection, healing problems, scarring, loss of sensation, fluid accumulations, anesthesia-related complications, pneumothorax, DVT, PE, contour deformities, contour deformities at the periphery of the reconstruction, failure of device, capsular contracture, displacement of device, wrinkles, asymmetry, and there certainly would be some asymmetry, chronic pain, and overall disappointment.  She also understood the possibility of loss of some of the skin on the mastectomy flaps.  She wished to proceed.  DESCRIPTION OF PROCEDURE:  The mastectomy had been completed, and the skin flaps were inspected and found to be in excellent condition.  Good color was noted and bright red bleeding at the periphery  consistent with viability.  Thorough irrigation with saline.  The dissection was then carried deep to the pectoralis major muscle, lifting serratus for short distance laterally and rectus inferiorly.  Thorough irrigation with saline.  Hemostasis with electrocautery.  Throughout this dissection, great care was taken to avoid damage to underlying chest cavity.  The expander was soaked in antibiotic solution and was prepared after thoroughly cleaning gloves.  This was a Mentor 800 mL moderate height tissue expander.  A 200 mL sterile saline was placed using a closed filling system and the expander was then returned to the antibiotic solution.  The space underneath the muscle was inspected and hemostasis with electrocautery.  Thorough irrigation again with saline and antibiotic solution.  The skin edges were prepped with Betadine and the tissue expander was positioned avoiding contact with the skin edges. Care was taken to make sure that the expander was oriented properly and no folds and wrinkles.  With the expander in position, antibiotic solution again placed and the muscle was closed with 3-0 Vicryl interrupted sutures.  Two 19-French drains were positioned, brought out through separate stab wounds inferomedially and secured with 3-0 Prolene sutures.  Care was taken to make sure that the subcutaneous tunnels were longer than 5 cm for each drain.  The mastectomy space was irrigated one more time with saline followed by antibiotic  solution, and then again hemostasis with electrocautery.  Skin edges were again inspected and found to be in excellent condition and appeared to viable.  Skin closure with 3-0 Monocryl interrupted inverted deep dermal sutures and Dermabond.  The drains were dressed with Biopatch and SorbaView dressings and ABDs were applied, and she was transported to the recovery room stable having tolerated the procedure well.  DISPOSITION:  She will be observed  overnight.     Crissie Reese, M.D.     DB/MEDQ  D:  07/28/2014  T:  07/29/2014  Job:  [id number]

## 2014-07-29 NOTE — Discharge Summary (Signed)
Physician Discharge Summary  Patient ID: [name] MRN: [id number] DOB/AGE: 12/20/1945 69 y.o.  Admit date: 07/28/2014 Discharge date: 07/29/2014  Admission Diagnoses:  Discharge Diagnoses:  Active Problems:   Breast cancer of upper-outer quadrant of left female breast   Breast cancer, left breast   Discharged Condition: good  Hospital Course: the pt underwent left mastectomy and sentinel node biopsy with reconstruction. She tolerated surgery well and on pod 1 was ready for discharge home  Consults: None  Significant Diagnostic Studies: none  Treatments: surgery: as above  Discharge Exam: Blood pressure 148/59, pulse 72, temperature 99.3 F (37.4 C), temperature source Oral, resp. rate 18, height 5\' 7"  (1.702 m), weight 188 lb (85.276 kg), SpO2 95 %. Chest wall: skin looks viable  Disposition: 01-Home or Self Care  Discharge Instructions    Call MD for:  difficulty breathing, headache or visual disturbances    Complete by:  As directed      Call MD for:  extreme fatigue    Complete by:  As directed      Call MD for:  hives    Complete by:  As directed      Call MD for:  persistant dizziness or light-headedness    Complete by:  As directed      Call MD for:  persistant nausea and vomiting    Complete by:  As directed      Call MD for:  redness, tenderness, or signs of infection (pain, swelling, redness, odor or green/yellow discharge around incision site)    Complete by:  As directed      Call MD for:  severe uncontrolled pain    Complete by:  As directed      Call MD for:  temperature >100.4    Complete by:  As directed      Diet - low sodium heart healthy    Complete by:  As directed      Discharge instructions    Complete by:  As directed   Sponge bathe while drains are in. Record output and recharge bulb twice a day     Increase activity slowly    Complete by:  As directed             Medication List    STOP taking these medications        acetaminophen 500 MG tablet  Commonly known as:  TYLENOL     HAIR/SKIN/NAILS PO     ibuprofen 200 MG tablet  Commonly known as:  ADVIL,MOTRIN      TAKE these medications        citalopram 10 MG tablet  Commonly known as:  CELEXA  Take 1 tablet (10 mg total) by mouth daily.     docusate sodium 100 MG capsule  Commonly known as:  COLACE  Take 1 capsule (100 mg total) by mouth daily.     doxycycline 50 MG capsule  Commonly known as:  VIBRAMYCIN  Take 1 capsule (50 mg total) by mouth 2 (two) times daily.     enoxaparin 40 MG/0.4ML injection  Commonly known as:  LOVENOX  Inject 0.4 mLs (40 mg total) into the skin at bedtime.     furosemide 40 MG tablet  Commonly known as:  LASIX  Take 1 tablet (40 mg total) by mouth daily.     methocarbamol 500 MG tablet  Commonly known as:  ROBAXIN  Take 1 tablet (500 mg total) by mouth 4 (four) times daily.  OVER THE COUNTER MEDICATION  Probiotic 90 billion-Take 1 tablet by mouth daily.     oxyCODONE-acetaminophen 5-325 MG per tablet  Commonly known as:  PERCOCET/ROXICET  Take 1-2 tablets by mouth every 4 (four) hours as needed for moderate pain.     tamoxifen 20 MG tablet  Commonly known as:  NOLVADEX  Take 1 tablet (20 mg total) by mouth daily.     Vitamin B-12 CR 1000 MCG Tbcr  Take 1 tablet by mouth daily.     Vitamin D3 1000 UNITS Caps  Take 1 capsule by mouth daily.         Signed: Jovita Kussmaul S 07/29/2014, 2:17 PM

## 2014-07-29 NOTE — Progress Notes (Signed)
1 Day Post-Op  Subjective: No complaints  Objective: Vital signs in last 24 hours: Temp:  [97.9 F (36.6 C)-99.5 F (37.5 C)] 99.3 F (37.4 C) (03/04 1331) Pulse Rate:  [72-91] 72 (03/04 1331) Resp:  [17-18] 18 (03/04 1331) BP: (135-150)/(57-69) 148/59 mmHg (03/04 1331) SpO2:  [95 %-99 %] 95 % (03/04 1331)    Intake/Output from previous day: 03/03 0701 - 03/04 0700 In: 3020 [P.O.:120; I.V.:2900] Out: 1080 [Urine:685; Drains:295; Blood:100] Intake/Output this shift: Total I/O In: 240 [P.O.:240] Out: 70 [Drains:70]  Resp: clear to auscultation bilaterally Chest wall: skin looks viable Cardio: regular rate and rhythm GI: soft, non-tender; bowel sounds normal; no masses,  no organomegaly  Lab Results:  No results for input(s): WBC, HGB, HCT, PLT in the last 72 hours. BMET No results for input(s): NA, K, CL, CO2, GLUCOSE, BUN, CREATININE, CALCIUM in the last 72 hours. PT/INR No results for input(s): LABPROT, INR in the last 72 hours. ABG No results for input(s): PHART, HCO3 in the last 72 hours.  Invalid input(s): PCO2, PO2  Studies/Results: Nm Sentinel Node Inj-no Rpt (breast)  07/28/2014   CLINICAL DATA: left breast cancer   Sulfur colloid was injected intradermally by the nuclear medicine  technologist for breast cancer sentinel node localization.     Anti-infectives: Anti-infectives    Start     Dose/Rate Route Frequency Ordered Stop   07/29/14 0000  doxycycline (VIBRAMYCIN) 50 MG capsule     50 mg Oral 2 times daily 07/29/14 1405     07/28/14 1245  ceFAZolin (ANCEF) IVPB 1 g/50 mL premix  Status:  Discontinued     1 g 100 mL/hr over 30 Minutes Intravenous 4 times per day 07/28/14 1234 07/29/14 0118   07/28/14 0730  bacitracin 50,000 Units, gentamicin (GARAMYCIN) 80 mg, ceFAZolin (ANCEF) 1 g in sodium chloride 0.9 % 1,000 mL      Irrigation Once 07/28/14 0722 07/28/14 1008   07/28/14 0600  ceFAZolin (ANCEF) IVPB 2 g/50 mL premix     2 g 100 mL/hr over 30  Minutes Intravenous On call to O.R. 07/27/14 1330 07/28/14 0748      Assessment/Plan: s/p Procedure(s): LEFT MASTECTOMY WITH SENTINEL LYMPH NODE MAPPING (Left) LEFT BREAST RECONSTRUCTION PLACEMENT OF LEFT TISSUE EXPANDER  (Left) Advance diet Discharge     TOTH III,Kamron Portee S 07/29/2014

## 2014-07-29 NOTE — Discharge Summary (Signed)
Physician Discharge Summary  Patient ID: [name] MRN: [id number] DOB/AGE: September 01, 1945 69 y.o.  Admit date: 07/28/2014 Discharge date: 07/29/2014  Admission Diagnoses:left breast cancer  Discharge Diagnoses: same  Active Problems:   Breast cancer of upper-outer quadrant of left female breast   Breast cancer, left breast   Discharged Condition: good  Hospital Course: On the day of admission the patient was taken to surgery and had left mastectomy, sentinel node, tissue expander. The patient tolerated the procedures well. Postoperatively, the flap maintained excellent color and capillary refill. The patient was ambulatory and tolerating diet on the first postoperative day.  Treatments: antibiotics: Ancef, anticoagulation: heparin and surgery: left mastectomy, sentinel node, tissue expander  Discharge Exam: Blood pressure 148/59, pulse 72, temperature 99.3 F (37.4 C), temperature source Oral, resp. rate 18, height 5\' 7"  (1.702 m), weight 188 lb (85.276 kg), SpO2 95 %.  Operative sites: Mastectomy flaps viable. Tissue expander appears to be okay. Drains functioning. Drainage thin. There is no evidence of bleeding or infection.  Disposition: 01-Home or Self Care     Medication List    STOP taking these medications        acetaminophen 500 MG tablet  Commonly known as:  TYLENOL     HAIR/SKIN/NAILS PO     ibuprofen 200 MG tablet  Commonly known as:  ADVIL,MOTRIN      TAKE these medications        citalopram 10 MG tablet  Commonly known as:  CELEXA  Take 1 tablet (10 mg total) by mouth daily.     docusate sodium 100 MG capsule  Commonly known as:  COLACE  Take 1 capsule (100 mg total) by mouth daily.     doxycycline 50 MG capsule  Commonly known as:  VIBRAMYCIN  Take 1 capsule (50 mg total) by mouth 2 (two) times daily.     enoxaparin 40 MG/0.4ML injection  Commonly known as:  LOVENOX  Inject 0.4 mLs (40 mg total) into the skin at bedtime.     furosemide 40 MG  tablet  Commonly known as:  LASIX  Take 1 tablet (40 mg total) by mouth daily.     methocarbamol 500 MG tablet  Commonly known as:  ROBAXIN  Take 1 tablet (500 mg total) by mouth 4 (four) times daily.     OVER THE COUNTER MEDICATION  Probiotic 90 billion-Take 1 tablet by mouth daily.     oxyCODONE-acetaminophen 5-325 MG per tablet  Commonly known as:  PERCOCET/ROXICET  Take 1-2 tablets by mouth every 4 (four) hours as needed for moderate pain.     tamoxifen 20 MG tablet  Commonly known as:  NOLVADEX  Take 1 tablet (20 mg total) by mouth daily.     Vitamin B-12 CR 1000 MCG Tbcr  Take 1 tablet by mouth daily.     Vitamin D3 1000 UNITS Caps  Take 1 capsule by mouth daily.         SignedHarlow Mares, Jahmier Willadsen M 07/29/2014, 2:06 PM

## 2014-07-29 NOTE — Progress Notes (Signed)
Chaplain visited with patient and husband.  Pt reports feeling better and is hopeful she will be discharged today.  Pt shared previous history of breast cancer and that it has been 20 years.  She is thankful there was reportedly no node involvement.  This current event seems to have been minimized for patient and family as last week, the patient's 69 year old sister-in-law passed away from breast cancer.  Chaplain expressed prayers for healing from the surgery and support during family grief.  [place] Intern 07/29/2014 10:35 AM

## 2014-08-04 ENCOUNTER — Encounter: Payer: Self-pay | Admitting: *Deleted

## 2014-08-04 NOTE — Progress Notes (Signed)
Ordered oncotype per Dr. Virgie Dad order.  Faxed requisition to pathology and confirmed with [facility].

## 2014-08-11 ENCOUNTER — Telehealth: Payer: Self-pay | Admitting: *Deleted

## 2014-08-11 NOTE — Telephone Encounter (Signed)
Received oncotype score of 3/4%. Copy given to Dr. Jana Hakim. Original to [facility] for scanning.

## 2014-08-12 ENCOUNTER — Encounter (HOSPITAL_COMMUNITY): Payer: Self-pay

## 2014-08-14 ENCOUNTER — Other Ambulatory Visit: Payer: Self-pay | Admitting: Oncology

## 2014-08-16 ENCOUNTER — Telehealth: Payer: Self-pay | Admitting: Oncology

## 2014-08-16 ENCOUNTER — Ambulatory Visit (HOSPITAL_BASED_OUTPATIENT_CLINIC_OR_DEPARTMENT_OTHER): Payer: Medicare HMO | Admitting: Oncology

## 2014-08-16 VITALS — BP 127/82 | HR 74 | Temp 98.8°F | Resp 18 | Ht 67.0 in | Wt 185.5 lb

## 2014-08-16 DIAGNOSIS — Z170 Estrogen receptor positive status [ER+]: Secondary | ICD-10-CM

## 2014-08-16 DIAGNOSIS — C50911 Malignant neoplasm of unspecified site of right female breast: Secondary | ICD-10-CM

## 2014-08-16 DIAGNOSIS — Z1589 Genetic susceptibility to other disease: Secondary | ICD-10-CM

## 2014-08-16 DIAGNOSIS — C50412 Malignant neoplasm of upper-outer quadrant of left female breast: Secondary | ICD-10-CM

## 2014-08-16 HISTORY — DX: Genetic susceptibility to other disease: Z15.89

## 2014-08-16 MED ORDER — FLUCONAZOLE 100 MG PO TABS: 100.0000 mg | ORAL_TABLET | Freq: Every day | ORAL | Status: DC

## 2014-08-16 NOTE — Progress Notes (Signed)
[name]  Telephone:(336) [phone] [phone]) [phone]     ID: [name] DOB: 09/05/1945  MR#: [id number]  YIR#:[id number]  Patient Care Team: [name] Jeans, PA-C as PCP - General (Physician Assistant) [name] Feil, MD as Attending Physician (Gastroenterology) [name] Hector, MD (Cardiology) [name] Fowler, MD as Attending Physician (Obstetrics and Gynecology) [name] Cruel, MD (Hematology and Oncology) PCP: [name] Rio, PA-C GYN: SU: [name] Age MD OTHER MD: [name] M.D. , [name] M.D., [name] M.D.  CHIEF COMPLAINT: Left-sided breast cancer  CURRENT TREATMENT: Awaiting definitive surgery  RESEARCH PROTOCOL: No    Breast cancer of upper-outer quadrant of left female breast   05/09/2014 Initial Diagnosis Left breast  upper-outer quadrant biopsy shows an invasive breast cancer with lobular features, grade 1, estrogen receptor 100% positive, progesterone receptor 100% positive, with an MIB-1 of 8% and no HER-2 amplification    HISTORY OF BREAST CANCER From the original intake note:  [name], now [name], was my patient in 1996, at which time she had an early stage breast cancer treated with mastectomy with TRAM flap reconstruction followed by CMF chemotherapy 8. She did not receive radiation or anti-estrogens. She was released from follow-up early this sensory.  More recently [name] had routine left screening mammography with tomography at the breast Center 04/27/2014. Breast density was category B. A possible mass in the left breast was noted and on 05/09/2014 she underwent diagnostic left mammography and ultrasonography showing a 9 mm spiculated asymmetry in the left breast upper outer quadrant. This was not palpable. Ultrasound identified a 1.0 cm irregular hypoechoic lesion in that area. Ultrasound of the left axilla was negative.  Biopsy of the left breast mass 05/09/2014 showed ([id number]) an invasive breast  cancer with lobular features, grade 1. The tumor was estrogen receptor 100% positive, progesterone receptor 99% positive, both with strong staining intensity. The MIB-1 was 8%. HER-2 was not amplified, the signals ratio being 1.10 and the number per cell 1.70.  On 05/17/2014 the patient underwent bilateral breast MRI. This showed breast composition category C. In the upper outer quadrant of the left breast there was stippled non-masslike enhancement measuring 3.9 cm. Within this region there was an area of slightly increased consolidation corresponding to the smaller mass noted on ultrasonography. There were no abnormal appearing lymph nodes in the right breast was unremarkable.  [name]'s case was presented at the breast cancer multidisciplinary conference 05/18/2014. The patient is a candidate for breast conservation, and if she opts for that her surgeon may wish to do a generous lumpectomy or proceed to biopsy of the more extensive area of stippled non-masslike enhancement. It was felt that since the patient had a mastectomy on the right she might choose a mastectomy on the left. An Oncotype test was also recommended.  [name] was evaluated at the breast clinic 05/18/2014. Her subsequent history is as detailed below  [name] returns today for follow-up of her breast cancer accompanied by a friend. Since her last visit here [name] underwent left mastectomy and sentinel lymph node sampling on 07/28/2014. The final pathology from that procedure ([id number]) showed a 1.67 m invasive ductal carcinoma, grade 1, with isolated tumor cells in the single sentinel lymph node. Repeat HER-2 testing was again negative, with the signals ratio 1.38 and the number per cell 1.80.--We also obtained an Oncotype DX on this sample from the surgery. This showed a score of 3 predicting a risk of outside  the breast recurrence over the next 10 years before percent if the patient's only systemic treatment is  tamoxifen for 5 years.  REVIEW OF SYSTEMS: She did well with her surgery, with no significant problems with pain, bleeding, or fever. She had an expander placed at the same time. As a result she is not able to exercise at present. She feels somewhat tired. She bruises easily. She has some back and joint pain which is not more intense or persistent than before. She has minimal numbness in her fingertips and toe tips, again unchanged. She continues to tolerate tamoxifen without significant issues and particularly hot flashes and vaginal dryness are not a concern. A detailed review of systems today was otherwise stable   Allergies  Allergen Reactions  . Ace Inhibitors     REACTION: angioedema  . Morphine And Related     "crazy"  . Codeine Rash  . Sulfonamide Derivatives Rash  . Telmisartan Rash    Current Outpatient Prescriptions  Medication Sig Dispense Refill  . Cholecalciferol (VITAMIN D3) 1000 UNITS CAPS Take 1 capsule by mouth daily.      . citalopram (CELEXA) 10 MG tablet Take 1 tablet (10 mg total) by mouth daily. 30 tablet 1  . Cyanocobalamin (VITAMIN B-12 CR) 1000 MCG TBCR Take 1 tablet by mouth daily.      . fluconazole (DIFLUCAN) 100 MG tablet Take 1 tablet (100 mg total) by mouth daily. 10 tablet 1  . furosemide (LASIX) 40 MG tablet Take 1 tablet (40 mg total) by mouth daily. 90 tablet 0  . OVER THE COUNTER MEDICATION Probiotic 90 billion-Take 1 tablet by mouth daily.    . tamoxifen (NOLVADEX) 20 MG tablet Take 1 tablet (20 mg total) by mouth daily. 90 tablet 4  . [DISCONTINUED] sucralfate (CARAFATE) 1 GM/10ML suspension Take 1 g by mouth at bedtime as needed.       No current facility-administered medications for this visit.    PAST MEDICAL HISTORY: Past Medical History  Diagnosis Date  . GERD (gastroesophageal reflux disease)   . Depression   . Vitamin D deficiency   . Vitamin B12 deficiency   . Lichen sclerosus   . Family history of breast cancer   . Family history  of colon cancer   . Family history of pancreatic cancer   . Dyspnea     Normal Spirometry 03/2008 EF 65% BNP normal 11/2007  . Blood dyscrasia     bruises and bleed easily  . History of hiatal hernia   . Complication of anesthesia     went to sleep easily but hard to wake up up until elbow OR in 2010  . Arthritis     "spine" (07/28/2014)  . Chronic back pain     "all over"  . Mild anxiety   . Breast cancer, right breast 1995  . Melanoma 2010    "right elbow; [facility] at [facility]"  . Breast cancer, left breast 07/28/2014    PAST SURGICAL HISTORY: Past Surgical History  Procedure Laterality Date  . Temporomandibular joint surgery Bilateral 1987  . Nissen fundoplication  01/2329  . Melanoma excision Right 2010    From elbow-- Done at [facility]   . Tonsillectomy    . Colonoscopy      Dr Sharlett Iles  . Reconstruction breast immediate / delayed w/ tissue expander Left 07/28/2014  . Hernia repair    . Breast biopsy Left 04/2014  . Mastectomy Right 1996     chemotherapy. pt. states 13  lymph nodes were removed  . Mastectomy complete / simple w/ sentinel node biopsy Left 07/28/2014  . Bunionectomy Bilateral 1970's  . Mastectomy w/ sentinel node biopsy Left 07/28/2014    Procedure: LEFT MASTECTOMY WITH SENTINEL LYMPH NODE MAPPING;  Surgeon: Autumn Messing III, MD;  Location: [place];  Service: General;  Laterality: Left;  . Breast reconstruction with placement of tissue expander and flex hd (acellular hydrated dermis) Left 07/28/2014    Procedure: LEFT BREAST RECONSTRUCTION PLACEMENT OF LEFT TISSUE EXPANDER ;  Surgeon: [name] Reese, MD;  Location: [place];  Service: Plastics;  Laterality: Left;    FAMILY HISTORY Family History  Problem Relation [name] of Onset  . Stroke Other     F 1st degree relative 35, M 1st degree relative  . Colon cancer Cousin 37    double first cousin  . Colon cancer Cousin 19    double first cousin  . Colon polyps Father     between 4-20  . Dementia Father   . Pancreatic cancer  Brother 13  . Colon polyps Brother     between 10-20  . Cancer Paternal Uncle     NOS  . Anemia Paternal Grandfather     pernicious anemia  . Breast cancer Cousin 40    double first cousin  . Breast cancer Cousin 68    paternal cousin   the patient's father died at [name] 67 in the setting of Alzheimer's disease. The patient's mother died at [name] 64 following a stroke. Adisa has 3 brothers, no sisters. Zarria's niece, Kela Millin, was my patient with a history of breast cancer diagnosed at [name] 64. She succumbed to that tumor. The patient also has 2 paternal cousins with breast cancer diagnosed at [name] 39 and [name] 37. One brother has prostate cancer diagnosed at [name] 86. The other brother has a history of throat cancer diagnosed at [name] 56. There is no other history of breast or ovarian cancer in the family to her knowledge.  GENETICS TESTING:  MUTYH mutation, normal BRCA genes  GYNECOLOGIC HISTORY:  No LMP recorded. Patient is postmenopausal. Menarche [name] 72, first live birth [name] 47. She is GX P2. She went through menopause in 1996 at the time of her chemotherapy. She did not take hormone replacement. 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 Her husband of 3 years, Marlou Sa, is a Gaffer at [employer]. He matches colors for a [employer]. [name] has 2 children from a prior marriage, Becky Sax, 69 years old, lives in [place] and is a homemaker; and Corene Cornea, 36, who lives in [place] and works as a Freight forwarder. Center has 4 biological grandchildren. Marlou Sa has a son, Mitzi Hansen, who lives in [place] and worse at [employer]. Marlou Sa has 4 grandchildren of his. The patient is a Psychologist, forensic.    ADVANCED DIRECTIVES: In place; currently Caisley's children Becky Sax and Corene Cornea are co- healthcare powers of attorney. Corene Cornea can be reached at [phone]   HEALTH  MAINTENANCE: History  Substance Use Topics  . Smoking status: Never Smoker   . Smokeless tobacco: Never Used     Comment: Regular Exercise - Yes  . Alcohol Use: No     Colonoscopy:  PAP: 2013  Bone density: Remote  Lipid panel:    OBJECTIVE: Middle-aged white [name] in no acute distress Filed Vitals:   08/16/14 1510  BP: 127/82  Pulse:  74  Temp: 98.8 F (37.1 C)  Resp: 18     Body mass index is 29.05 kg/(m^2).    ECOG FS:1 - Symptomatic but completely ambulatory  Sclerae unicteric, EOMs intact Oropharynx clear and moist-- no thrush or other lesions No cervical or supraclavicular adenopathy Lungs no rales or rhonchi Heart regular rate and rhythm Abd soft, obese, nontender, positive bowel sounds MSK no focal spinal tenderness, no upper extremity lymphedema Neuro: nonfocal, well oriented, positive affect Breasts: The right breast is status post remote mastectomy with TRAM reconstruction. There is no evidence of local recurrence. The right axilla is benign. The left breast is status post recent mastectomy with expander in place. The incision is healing nicely. There is no significant dehiscence, erythema, or swelling. The left axilla is benign.  LAB RESULTS:  CMP     Component Value Date/Time   NA 142 07/20/2014 0920   NA 143 05/18/2014 0930   K 3.7 07/20/2014 0920   K 3.6 05/18/2014 0930   CL 105 07/20/2014 0920   CO2 29 07/20/2014 0920   CO2 28 05/18/2014 0930   GLUCOSE 92 07/20/2014 0920   GLUCOSE 101 05/18/2014 0930   BUN 14 07/20/2014 0920   BUN 17.0 05/18/2014 0930   CREATININE 0.90 07/20/2014 0920   CREATININE 1.0 05/18/2014 0930   CREATININE 0.85 11/11/2013 1105   CALCIUM 8.8 07/20/2014 0920   CALCIUM 9.6 05/18/2014 0930   PROT 6.4 07/20/2014 0920   PROT 7.0 05/18/2014 0930   ALBUMIN 3.7 07/20/2014 0920   ALBUMIN 3.9 05/18/2014 0930   AST 22 07/20/2014 0920   AST 23 05/18/2014 0930   ALT 19 07/20/2014 0920   ALT 21 05/18/2014 0930   ALKPHOS 56  07/20/2014 0920   ALKPHOS 76 05/18/2014 0930   BILITOT 0.4 07/20/2014 0920   BILITOT 0.39 05/18/2014 0930   GFRNONAA 64* 07/20/2014 0920   GFRAA 74* 07/20/2014 0920    INo results found for: SPEP, UPEP  Lab Results  Component Value Date   WBC 6.2 07/20/2014   NEUTROABS 4.0 07/20/2014   HGB 13.5 07/20/2014   HCT 40.6 07/20/2014   MCV 91.4 07/20/2014   PLT 180 07/20/2014      Chemistry      Component Value Date/Time   NA 142 07/20/2014 0920   NA 143 05/18/2014 0930   K 3.7 07/20/2014 0920   K 3.6 05/18/2014 0930   CL 105 07/20/2014 0920   CO2 29 07/20/2014 0920   CO2 28 05/18/2014 0930   BUN 14 07/20/2014 0920   BUN 17.0 05/18/2014 0930   CREATININE 0.90 07/20/2014 0920   CREATININE 1.0 05/18/2014 0930   CREATININE 0.85 11/11/2013 1105      Component Value Date/Time   CALCIUM 8.8 07/20/2014 0920   CALCIUM 9.6 05/18/2014 0930   ALKPHOS 56 07/20/2014 0920   ALKPHOS 76 05/18/2014 0930   AST 22 07/20/2014 0920   AST 23 05/18/2014 0930   ALT 19 07/20/2014 0920   ALT 21 05/18/2014 0930   BILITOT 0.4 07/20/2014 0920   BILITOT 0.39 05/18/2014 0930       No results found for: LABCA2  No components found for: [id number]  No results for input(s): INR in the last 168 hours.  Urinalysis    Component Value Date/Time   COLORURINE YELLOW 11/22/2012 0836   APPEARANCEUR CLEAR 11/22/2012 0836   LABSPEC 1.019 11/22/2012 0836   PHURINE 6.5 11/22/2012 0836   GLUCOSEU NEGATIVE 11/22/2012 0836   GLUCOSEU NEGATIVE 02/06/2011 0845  HGBUR NEGATIVE 11/22/2012 0836   BILIRUBINUR small 11/11/2013 [place] 11/22/2012 0836   KETONESUR NEGATIVE 11/22/2012 0836   PROTEINUR 30+ 11/11/2013 0847   PROTEINUR NEGATIVE 11/22/2012 0836   UROBILINOGEN 0.2 11/11/2013 0847   UROBILINOGEN 1.0 11/22/2012 0836   NITRITE neg 11/11/2013 0847   NITRITE NEGATIVE 11/22/2012 0836   LEUKOCYTESUR moderate (2+) 11/11/2013 0847    RADIOLOGY AND OTHER STUDIES: Chest 2  View  07/20/2014   CLINICAL DATA:  Preoperative these examination prior to mastectomy  EXAM: CHEST  2 VIEW  COMPARISON:  PA and lateral chest of May 16, 2014  FINDINGS: The lungs are adequately inflated and clear. The heart and pulmonary vascularity are normal. The mediastinum is normal in width. There is mild reverse S-shaped thoracolumbar spine curvature. There is no pleural effusion. There are numerous surgical clips over the right breast and upper abdominal wall.  IMPRESSION: There is no active cardiopulmonary disease.   Electronically Signed   By: David  Martinique   On: 07/20/2014 13:54   Nm Sentinel Node Inj-no Rpt (breast)  07/28/2014   CLINICAL DATA: left breast cancer   Sulfur colloid was injected intradermally by the nuclear medicine  technologist for breast cancer sentinel node localization.     ASSESSMENT: 69 y.o. [name] status post left breast biopsy 05/09/2014 for a clinical T1-T2, N0 (stage I-II) invasive lobular breast cancer, estrogen receptor 100% positive, progesterone receptor 99% positive, with an MIB-1 of 8%, and no HER-2 amplification.  (1) history of right modified radical mastectomy with TRAM reconstruction 1996, followed by chemotherapy for 6 months; did not receive antiestrogen therapy or radiation therapy adjuvantly  (2) tamoxifen started neoadjuvantly 05/18/2014  (3) status post left mastectomy and sentinel lymph node sampling 07/28/2014 for a pT1c pN0(i+). Stage IA invasive ductal carcinoma, grade 1, with repeat HER-2 again negative. Margins were ample  (4) Oncotype score of 3 predicts an outside the breast risk of recurrence within 10 years of 4% if the patient's only systemic treatment is tamoxifen for 5 years. It also predicts no significant benefit from chemotherapy.  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 Sequencing and rearrangement analysis for the following 32 genes found no deleterious mutations: APC, ATM, BARD1, BMPR1A, BRCA1, BRCA2, BRIP1, CDH1, CDK4, CDKN2A, CHEK2, EPCAM, GREM1, MLH1, MRE11A, MSH2, MSH6, MUTYH, NBN, NF1, PALB2, PMS2, POLD1, POLE, PTEN, RAD50, RAD51D, SMAD4, SMARCA4, STK11, and TP53.  (a) Heterozygous MUTYH mutations are not associated with an increased risk for cancer in the patient, however, other family members may be at risk for homozygous mutations and therefore family testing should be considered.   PLAN: I went over Yulitza's pathology with her in detail so she would understand that isolated tumor cells do not negatively affect her prognosis. They are counted as negative.   We then discussed the question of radiation. She has an appointment with radiation oncology for next week, but she had a mastectomy for a small tumor, with ample margins, and is lymph node negative. I do not believe she will need radiation but I am leaving that question up to the radiation oncologist. He will cancel the appointment if it is not necessary.  We discussed her Oncotype. Three is a very low score and it means that without chemotherapy and with only 5 years of tamoxifen her risk of outside the breast recurrence will be  less then 5%. Clearly chemotherapy will be of no benefit. The plan accordingly is to continue tamoxifen at least 5 years and then consider either continuing 5 more years of switching to an aromatase inhibitor.  Finally we discussed her genetic mutation. She understands that she has one normal and 1 abnormal MUTYH gene. Because she has a normal gene, this is not significant for her. However if one of her children happen to have inherited another mutant gene from the other parent, and both genes were defective, they would have a very significant risk of developing colon cancer and would need intense surveillance.  For that reason we have recommended testing of other  family members. That can be done through their own doctors offices or if she wishes [place] to facilitate that we will be glad to do that.  The patient has a good understanding of the overall plan. She agrees with it. She knows the goal of treatment in her case is cure. She will call with any problems that may develop before her next visit here, which will be in 3 months.  [name] Cruel, MD   08/16/2014 7:00 PM Medical Oncology and Hematology [facility] [address] [place], [place] [address] Tel. [phone]    Fax. [phone]

## 2014-08-16 NOTE — Telephone Encounter (Signed)
Gave patient avs report and appointments for june °

## 2014-08-18 ENCOUNTER — Telehealth: Payer: Self-pay | Admitting: *Deleted

## 2014-08-18 NOTE — Telephone Encounter (Signed)
[name] called asking if RT appointment has been cancelled as discussed with Dr. Jana Hakim on 08-16-2014.  Appointment scheduled and plan for visit per office note is for RT provider to detremine if radiation needed.  Patient states she may cancel radiation appointment as she will be out of town.

## 2014-08-22 ENCOUNTER — Ambulatory Visit
Admission: RE | Admit: 2014-08-22 | Discharge: 2014-08-22 | Disposition: A | Discharge status: Home / Self Care | Payer: Medicare HMO | Source: Ambulatory Visit | Attending: Radiation Oncology | Admitting: Radiation Oncology

## 2014-08-22 ENCOUNTER — Ambulatory Visit: Payer: Medicare HMO

## 2014-08-28 ENCOUNTER — Encounter (HOSPITAL_BASED_OUTPATIENT_CLINIC_OR_DEPARTMENT_OTHER): Payer: Self-pay

## 2014-08-28 ENCOUNTER — Emergency Department (HOSPITAL_BASED_OUTPATIENT_CLINIC_OR_DEPARTMENT_OTHER): Payer: Medicare HMO

## 2014-08-28 ENCOUNTER — Inpatient Hospital Stay (HOSPITAL_BASED_OUTPATIENT_CLINIC_OR_DEPARTMENT_OTHER)
Admission: EM | Admit: 2014-08-28 | Discharge: 2014-09-01 | DRG: 872 | Disposition: A | Discharge status: Home / Self Care | Payer: Medicare HMO | Attending: Internal Medicine | Admitting: Internal Medicine

## 2014-08-28 DIAGNOSIS — Z800 Family history of malignant neoplasm of digestive organs: Secondary | ICD-10-CM

## 2014-08-28 DIAGNOSIS — A419 Sepsis, unspecified organism: Secondary | ICD-10-CM | POA: Diagnosis present

## 2014-08-28 DIAGNOSIS — K222 Esophageal obstruction: Secondary | ICD-10-CM | POA: Diagnosis present

## 2014-08-28 DIAGNOSIS — Z9011 Acquired absence of right breast and nipple: Secondary | ICD-10-CM | POA: Diagnosis present

## 2014-08-28 DIAGNOSIS — Z79810 Long term (current) use of selective estrogen receptor modulators (SERMs): Secondary | ICD-10-CM

## 2014-08-28 DIAGNOSIS — R1310 Dysphagia, unspecified: Secondary | ICD-10-CM | POA: Diagnosis present

## 2014-08-28 DIAGNOSIS — R1013 Epigastric pain: Secondary | ICD-10-CM | POA: Diagnosis not present

## 2014-08-28 DIAGNOSIS — K3580 Unspecified acute appendicitis: Secondary | ICD-10-CM | POA: Diagnosis present

## 2014-08-28 DIAGNOSIS — R198 Other specified symptoms and signs involving the digestive system and abdomen: Secondary | ICD-10-CM | POA: Diagnosis not present

## 2014-08-28 DIAGNOSIS — M797 Fibromyalgia: Secondary | ICD-10-CM | POA: Diagnosis present

## 2014-08-28 DIAGNOSIS — Z85820 Personal history of malignant melanoma of skin: Secondary | ICD-10-CM | POA: Diagnosis not present

## 2014-08-28 DIAGNOSIS — F329 Major depressive disorder, single episode, unspecified: Secondary | ICD-10-CM | POA: Diagnosis present

## 2014-08-28 DIAGNOSIS — IMO0001 Reserved for inherently not codable concepts without codable children: Secondary | ICD-10-CM | POA: Insufficient documentation

## 2014-08-28 DIAGNOSIS — C50912 Malignant neoplasm of unspecified site of left female breast: Secondary | ICD-10-CM | POA: Diagnosis present

## 2014-08-28 DIAGNOSIS — R509 Fever, unspecified: Secondary | ICD-10-CM | POA: Diagnosis present

## 2014-08-28 DIAGNOSIS — C50911 Malignant neoplasm of unspecified site of right female breast: Secondary | ICD-10-CM | POA: Diagnosis present

## 2014-08-28 DIAGNOSIS — Z9221 Personal history of antineoplastic chemotherapy: Secondary | ICD-10-CM | POA: Diagnosis not present

## 2014-08-28 DIAGNOSIS — Z9012 Acquired absence of left breast and nipple: Secondary | ICD-10-CM | POA: Diagnosis present

## 2014-08-28 DIAGNOSIS — L0291 Cutaneous abscess, unspecified: Secondary | ICD-10-CM | POA: Insufficient documentation

## 2014-08-28 DIAGNOSIS — C439 Malignant melanoma of skin, unspecified: Secondary | ICD-10-CM | POA: Diagnosis present

## 2014-08-28 DIAGNOSIS — Z803 Family history of malignant neoplasm of breast: Secondary | ICD-10-CM

## 2014-08-28 DIAGNOSIS — I10 Essential (primary) hypertension: Secondary | ICD-10-CM | POA: Diagnosis present

## 2014-08-28 DIAGNOSIS — K219 Gastro-esophageal reflux disease without esophagitis: Secondary | ICD-10-CM | POA: Diagnosis present

## 2014-08-28 DIAGNOSIS — K449 Diaphragmatic hernia without obstruction or gangrene: Secondary | ICD-10-CM

## 2014-08-28 DIAGNOSIS — Z823 Family history of stroke: Secondary | ICD-10-CM

## 2014-08-28 DIAGNOSIS — Z170 Estrogen receptor positive status [ER+]: Secondary | ICD-10-CM | POA: Diagnosis not present

## 2014-08-28 DIAGNOSIS — R21 Rash and other nonspecific skin eruption: Secondary | ICD-10-CM | POA: Diagnosis not present

## 2014-08-28 DIAGNOSIS — M1990 Unspecified osteoarthritis, unspecified site: Secondary | ICD-10-CM | POA: Diagnosis present

## 2014-08-28 DIAGNOSIS — R6510 Systemic inflammatory response syndrome (SIRS) of non-infectious origin without acute organ dysfunction: Secondary | ICD-10-CM | POA: Diagnosis present

## 2014-08-28 LAB — CBC WITH DIFFERENTIAL/PLATELET
BASOS ABS: 0 10*3/uL (ref 0.0–0.1)
Basophils Relative: 0 % (ref 0–1)
EOS PCT: 1 % (ref 0–5)
Eosinophils Absolute: 0.1 10*3/uL (ref 0.0–0.7)
HCT: 42.7 % (ref 36.0–46.0)
Hemoglobin: 14.4 g/dL (ref 12.0–15.0)
LYMPHS PCT: 7 % — AB (ref 12–46)
Lymphs Abs: 1.1 10*3/uL (ref 0.7–4.0)
MCH: 31.2 pg (ref 26.0–34.0)
MCHC: 33.7 g/dL (ref 30.0–36.0)
MCV: 92.6 fL (ref 78.0–100.0)
Monocytes Absolute: 1 10*3/uL (ref 0.1–1.0)
Monocytes Relative: 6 % (ref 3–12)
NEUTROS PCT: 86 % — AB (ref 43–77)
Neutro Abs: 14.1 10*3/uL — ABNORMAL HIGH (ref 1.7–7.7)
PLATELETS: 140 10*3/uL — AB (ref 150–400)
RBC: 4.61 MIL/uL (ref 3.87–5.11)
RDW: 12.8 % (ref 11.5–15.5)
WBC: 16.2 10*3/uL — ABNORMAL HIGH (ref 4.0–10.5)

## 2014-08-28 LAB — BASIC METABOLIC PANEL
ANION GAP: 10 (ref 5–15)
BUN: 14 mg/dL (ref 6–23)
CHLORIDE: 102 mmol/L (ref 96–112)
CO2: 26 mmol/L (ref 19–32)
Calcium: 8.7 mg/dL (ref 8.4–10.5)
Creatinine, Ser: 0.86 mg/dL (ref 0.50–1.10)
GFR calc non Af Amer: 68 mL/min — ABNORMAL LOW (ref >90)
GFR, EST AFRICAN AMERICAN: 79 mL/min — AB (ref >90)
GLUCOSE: 134 mg/dL — AB (ref 70–99)
Potassium: 3.5 mmol/L (ref 3.5–5.1)
SODIUM: 138 mmol/L (ref 135–145)

## 2014-08-28 LAB — URINALYSIS, ROUTINE W REFLEX MICROSCOPIC
Bilirubin Urine: NEGATIVE
GLUCOSE, UA: NEGATIVE mg/dL
HGB URINE DIPSTICK: NEGATIVE
Ketones, ur: 15 mg/dL — AB
Leukocytes, UA: NEGATIVE
Nitrite: NEGATIVE
Protein, ur: NEGATIVE mg/dL
Specific Gravity, Urine: 1.018 (ref 1.005–1.030)
Urobilinogen, UA: 1 mg/dL (ref 0.0–1.0)
pH: 8 (ref 5.0–8.0)

## 2014-08-28 LAB — TROPONIN I: Troponin I: <0.03 ng/mL (ref <0.031)

## 2014-08-28 LAB — PROTIME-INR
INR: 1.08 (ref 0.00–1.49)
Prothrombin Time: 14.1 seconds (ref 11.6–15.2)

## 2014-08-28 LAB — FIBRINOGEN: FIBRINOGEN: 359 mg/dL (ref 204–475)

## 2014-08-28 LAB — I-STAT CG4 LACTIC ACID, ED
LACTIC ACID, VENOUS: 1.89 mmol/L (ref 0.5–2.0)
LACTIC ACID, VENOUS: 1.73 mmol/L (ref 0.5–2.0)

## 2014-08-28 LAB — APTT: APTT: 29 s (ref 24–37)

## 2014-08-28 MED ORDER — PIPERACILLIN-TAZOBACTAM 3.375 G IVPB 30 MIN
3.3750 g | INTRAVENOUS | Status: DC
  Filled 2014-08-28: qty 50

## 2014-08-28 MED ORDER — PIPERACILLIN-TAZOBACTAM 3.375 G IVPB 30 MIN: 3.3750 g | Freq: Once | INTRAVENOUS | Status: DC

## 2014-08-28 MED ORDER — ONDANSETRON HCL 4 MG/2ML IJ SOLN: 4.0000 mg | Freq: Four times a day (QID) | INTRAMUSCULAR | Status: DC | PRN

## 2014-08-28 MED ORDER — SODIUM CHLORIDE 0.9 % IV SOLN
INTRAVENOUS | Status: DC
  Administered 2014-08-29 – 2014-08-30 (×4): via INTRAVENOUS

## 2014-08-28 MED ORDER — VANCOMYCIN HCL IN DEXTROSE 1-5 GM/200ML-% IV SOLN
1000.0000 mg | Freq: Once | INTRAVENOUS | Status: AC
  Administered 2014-08-28: 1000 mg via INTRAVENOUS
  Filled 2014-08-28: qty 200

## 2014-08-28 MED ORDER — PIPERACILLIN-TAZOBACTAM 3.375 G IVPB 30 MIN
3.3750 g | Freq: Once | INTRAVENOUS | Status: AC
  Administered 2014-08-28: 3.375 g via INTRAVENOUS
  Filled 2014-08-28 (×2): qty 50

## 2014-08-28 MED ORDER — ENOXAPARIN SODIUM 40 MG/0.4ML ~~LOC~~ SOLN
40.0000 mg | Freq: Every day | SUBCUTANEOUS | Status: DC
  Administered 2014-08-28 – 2014-08-31 (×4): 40 mg via SUBCUTANEOUS
  Filled 2014-08-28 (×5): qty 0.4

## 2014-08-28 MED ORDER — PIPERACILLIN-TAZOBACTAM 3.375 G IVPB
3.3750 g | Freq: Three times a day (TID) | INTRAVENOUS | Status: DC
  Administered 2014-08-28 – 2014-08-30 (×5): 3.375 g via INTRAVENOUS
  Filled 2014-08-28 (×8): qty 50

## 2014-08-28 MED ORDER — VANCOMYCIN HCL IN DEXTROSE 1-5 GM/200ML-% IV SOLN
1000.0000 mg | Freq: Two times a day (BID) | INTRAVENOUS | Status: DC
  Administered 2014-08-29 – 2014-08-30 (×3): 1000 mg via INTRAVENOUS
  Filled 2014-08-28 (×4): qty 200

## 2014-08-28 MED ORDER — VANCOMYCIN HCL IN DEXTROSE 1-5 GM/200ML-% IV SOLN: 1000.0000 mg | Freq: Once | INTRAVENOUS | Status: DC

## 2014-08-28 MED ORDER — SODIUM CHLORIDE 0.9 % IV BOLUS (SEPSIS)
2500.0000 mL | Freq: Once | INTRAVENOUS | Status: AC
  Administered 2014-08-28: 15:00:00 via INTRAVENOUS
  Administered 2014-08-28: 1000 mL via INTRAVENOUS
  Administered 2014-08-28: 500 mL via INTRAVENOUS

## 2014-08-28 NOTE — ED Notes (Addendum)
Pt post op march 3 from mastectomy on L side, still with jp drain in place.  Today with body aches, vomiting, subjective fever.  Concerned for post op infection.  Last tylenol @ 1400, no motrin today.

## 2014-08-28 NOTE — ED Provider Notes (Addendum)
CSN: [id number]     Arrival date & time 08/28/14  1424 History  This chart was scribed for Orlie Dakin, MD by Edison Simon, ED Scribe. This patient was seen in room MH06/MH06 and the patient's care was started at 3:02 PM.    Chief Complaint  Patient presents with  . Post-op Problem   The history is provided by the patient. No language interpreter was used.    HPI Comments: [name] is a 69 y.o. female who presents to the Emergency Department complaining of nausea, vomiting, subjective fever, diffuse muscles aches with onset this morning, worsening this afternoon. She had mastectomy on left side on march 3 by Dr. [name] who works with Dr. Harlow Mares; she has JP drain still in place. She states she has had abdominal pain since the surgery, not above baseline now but more sore today than yesterday. She has had breast cancer twice. 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 She reports baseline cough due to esophageal problem. No abdominal painPresently. No diarrhea.associated symptoms include lightheadedness. She treated herself with a muscle relaxer and Tylenol immediately prior to coming here. No other associated symptomsShe denies smoking or drinking. She denies difficulty urinating or dysuria.  Past Medical History  Diagnosis Date  . GERD (gastroesophageal reflux disease)   . Depression   . Vitamin D deficiency   . Vitamin B12 deficiency   . Lichen sclerosus   . Family history of breast cancer   . Family history of colon cancer   . Family history of pancreatic cancer   . Dyspnea     Normal Spirometry 03/2008 EF 65% BNP normal 11/2007  . Blood dyscrasia     bruises and bleed easily  . History of hiatal hernia   . Complication of anesthesia     went to sleep easily but hard to wake up up until elbow OR in 2010  . Arthritis     "spine" (07/28/2014)  . Chronic back pain     "all over"  . Mild anxiety   . Breast cancer, right breast 1995  . Melanoma 2010    "right elbow; treated at [facility]"  .  Breast cancer, left breast 07/28/2014   Past Surgical History  Procedure Laterality Date  . Temporomandibular joint surgery Bilateral 1987  . Nissen fundoplication  00/7121  . Melanoma excision Right 2010    From elbow-- Done at [facility]   . Tonsillectomy    . Colonoscopy      Dr Sharlett Iles  . Reconstruction breast immediate / delayed w/ tissue expander Left 07/28/2014  . Hernia repair    . Breast biopsy Left 04/2014  . Mastectomy Right 1996     chemotherapy. pt. states 13 lymph nodes were removed  . Mastectomy complete / simple w/ sentinel node biopsy Left 07/28/2014  . Bunionectomy Bilateral 1970's  . Mastectomy w/ sentinel node biopsy Left 07/28/2014    Procedure: LEFT MASTECTOMY WITH SENTINEL LYMPH NODE MAPPING;  Surgeon: Autumn Messing III, MD;  Location: [place];  Service: General;  Laterality: Left;  . Breast reconstruction with placement of tissue expander and flex hd (acellular hydrated dermis) Left 07/28/2014    Procedure: LEFT BREAST RECONSTRUCTION PLACEMENT OF LEFT TISSUE EXPANDER ;  Surgeon: Crissie Reese, MD;  Location: [place];  Service: Plastics;  Laterality: Left;   Family History  Problem Relation Age of Onset  . Stroke Other     F 1st degree relative 94, M 1st degree relative  . Colon cancer Cousin 17  double first cousin  . Colon cancer Cousin 65    double first cousin  . Colon polyps Father     between 29-20  . Dementia Father   . Pancreatic cancer Brother 74  . Colon polyps Brother     between 10-20  . Cancer Paternal Uncle     NOS  . Anemia Paternal Grandfather     pernicious anemia  . Breast cancer Cousin 1    double first cousin  . Breast cancer Cousin 74    paternal cousin   History  Substance Use Topics  . Smoking status: Never Smoker   . Smokeless tobacco: Never Used     Comment: Regular Exercise - Yes  . Alcohol Use: No   OB History    No data available     Review of Systems  Constitutional: Positive for fever.  HENT: Negative.   Respiratory:  Positive for cough.   Cardiovascular: Negative.   Gastrointestinal: Positive for nausea, vomiting and abdominal pain.  Genitourinary: Negative for dysuria and difficulty urinating.  Musculoskeletal: Positive for myalgias.  Skin: Negative.   Allergic/Immunologic: Positive for immunocompromised state.       Breast cancer  Neurological: Positive for light-headedness.  Psychiatric/Behavioral: Negative.       Allergies  Ace inhibitors; Morphine and related; Codeine; Sulfonamide derivatives; and Telmisartan  Home Medications   Prior to Admission medications   Medication Sig Start Date End Date Taking? Authorizing Provider  Cholecalciferol (VITAMIN D3) 1000 UNITS CAPS Take 1 capsule by mouth daily.      Historical Provider, MD  citalopram (CELEXA) 10 MG tablet Take 1 tablet (10 mg total) by mouth daily. 06/28/14   Brunetta Jeans, PA-C  Cyanocobalamin (VITAMIN B-12 CR) 1000 MCG TBCR Take 1 tablet by mouth daily.      Historical Provider, MD  fluconazole (DIFLUCAN) 100 MG tablet Take 1 tablet (100 mg total) by mouth daily. 08/16/14   Chauncey Cruel, MD  furosemide (LASIX) 40 MG tablet Take 1 tablet (40 mg total) by mouth daily. 06/28/14   Brunetta Jeans, PA-C  OVER THE COUNTER MEDICATION Probiotic 90 billion-Take 1 tablet by mouth daily.    Historical Provider, MD  tamoxifen (NOLVADEX) 20 MG tablet Take 1 tablet (20 mg total) by mouth daily. 05/18/14   Chauncey Cruel, MD   BP 129/63 mmHg  Pulse 115  Temp(Src) 102.1 F (38.9 C) (Oral)  Resp 18  Ht 5\' 7"  (1.702 m)  Wt 185 lb (83.915 kg)  BMI 28.97 kg/m2  SpO2 97% Physical Exam  Constitutional: She is oriented to person, place, and time. She appears well-developed and well-nourished.  Ill-appearing  HENT:  Head: Normocephalic and atraumatic.  Mucous membranes dry  Eyes: Conjunctivae are normal. Pupils are equal, round, and reactive to light.  Neck: Neck supple. No tracheal deviation present. No thyromegaly present.   Cardiovascular: Normal rate and regular rhythm.   No murmur heard. Pulmonary/Chest: Effort normal and breath sounds normal.  Right breast with surgical scars. Nontender. Left breast with clean-appearing sutured surgical wound. No drainage no surrounding redness or tenderness. There is a J_P drain and medially inferior to the left breast any serosanguineous liquid. She is not tender at the insertion site over thedrain  Abdominal: Soft. Bowel sounds are normal. She exhibits no distension. There is no tenderness.  Musculoskeletal: Normal range of motion. She exhibits no edema or tenderness.  Neurological: She is alert and oriented to person, place, and time. Coordination normal.  Skin: Skin  is warm and dry. No rash noted.  Psychiatric: She has a normal mood and affect.  Nursing note and vitals reviewed.   ED Course  Procedures (including critical care time)  DIAGNOSTIC STUDIES: Oxygen Saturation is 97% on room air, normal by my interpretation.    COORDINATION OF CARE: 3:08 PM Discussed treatment plan with patient at beside, the patient agrees with the plan and has no further questions at this time.   Labs Review Labs Reviewed  CULTURE, BLOOD (ROUTINE X 2)  CULTURE, BLOOD (ROUTINE X 2)  URINALYSIS, ROUTINE W REFLEX MICROSCOPIC  CBC WITH DIFFERENTIAL/PLATELET  BASIC METABOLIC PANEL  I-STAT CG4 LACTIC ACID, ED    Imaging Review No results found.   EKG Interpretation None      Patient became lightheaded and suffered a near syncopal event as I was interviewing her. Second peripheral IV line ordered. IV fluids will as ordered 30 mL/kg empiric antibiotics ordered. Code sepsis called 4:40 PM patient is alert states "I feel fine" after treatment with"intravenous fluids and antibiotics.she is alert Glasgow Coma Score 15 no distress She is hemodynamically stable  Chest x-ray viewed by me Results for orders placed or performed during the hospital encounter of 08/28/14  Urinalysis,  Routine w reflex microscopic  Result Value Ref Range   Color, Urine YELLOW YELLOW   APPearance CLEAR CLEAR   Specific Gravity, Urine 1.018 1.005 - 1.030   pH 8.0 5.0 - 8.0   Glucose, UA NEGATIVE NEGATIVE mg/dL   Hgb urine dipstick NEGATIVE NEGATIVE   Bilirubin Urine NEGATIVE NEGATIVE   Ketones, ur 15 (A) NEGATIVE mg/dL   Protein, ur NEGATIVE NEGATIVE mg/dL   Urobilinogen, UA 1.0 0.0 - 1.0 mg/dL   Nitrite NEGATIVE NEGATIVE   Leukocytes, UA NEGATIVE NEGATIVE  CBC with Differential  Result Value Ref Range   WBC 16.2 (H) 4.0 - 10.5 K/uL   RBC 4.61 3.87 - 5.11 MIL/uL   Hemoglobin 14.4 12.0 - 15.0 g/dL   HCT 42.7 36.0 - 46.0 %   MCV 92.6 78.0 - 100.0 fL   MCH 31.2 26.0 - 34.0 pg   MCHC 33.7 30.0 - 36.0 g/dL   RDW 12.8 11.5 - 15.5 %   Platelets 140 (L) 150 - 400 K/uL   Neutrophils Relative % 86 (H) 43 - 77 %   Neutro Abs 14.1 (H) 1.7 - 7.7 K/uL   Lymphocytes Relative 7 (L) 12 - 46 %   Lymphs Abs 1.1 0.7 - 4.0 K/uL   Monocytes Relative 6 3 - 12 %   Monocytes Absolute 1.0 0.1 - 1.0 K/uL   Eosinophils Relative 1 0 - 5 %   Eosinophils Absolute 0.1 0.0 - 0.7 K/uL   Basophils Relative 0 0 - 1 %   Basophils Absolute 0.0 0.0 - 0.1 K/uL  Basic metabolic panel  Result Value Ref Range   Sodium 138 135 - 145 mmol/L   Potassium 3.5 3.5 - 5.1 mmol/L   Chloride 102 96 - 112 mmol/L   CO2 26 19 - 32 mmol/L   Glucose, Bld 134 (H) 70 - 99 mg/dL   BUN 14 6 - 23 mg/dL   Creatinine, Ser 0.86 0.50 - 1.10 mg/dL   Calcium 8.7 8.4 - 10.5 mg/dL   GFR calc non Af Amer 68 (L) >90 mL/min   GFR calc Af Amer 79 (L) >90 mL/min   Anion gap 10 5 - 15  I-Stat CG4 Lactic Acid, ED  Result Value Ref Range   Lactic Acid, Venous  1.89 0.5 - 2.0 mmol/L   Dg Chest Port 1 View  (if Code Sepsis Called)  08/28/2014   CLINICAL DATA:  Nausea.  Vomiting.  Fever.  EXAM: PORTABLE CHEST - 1 VIEW  COMPARISON:  07/20/2014  FINDINGS: Surgical clips which project over the lower right chest and upper abdomen. Soft tissue  expander projects over the left chest wall. Surgical clips about the left axilla and lateral chest wall.  Midline trachea. Normal heart size. No pleural effusion or pneumothorax. Clear lungs.  IMPRESSION: No acute cardiopulmonary disease.   Electronically Signed   By: Abigail Miyamoto M.D.   On: 08/28/2014 15:34    MDM  Spoke with Dr. Rockne Menghini plan transfer [place] cultures pending. Diagnosis#1 sepsis #2 hyperglycemia Final diagnoses:  None  #3near syncope  CRITICAL CARE Performed by: Orlie Dakin Total critical care time: 30 minute Critical care time was exclusive of separately billable procedures and treating other patients. Critical care was necessary to treat or prevent imminent or life-threatening deterioration. Critical care was time spent personally by me on the following activities: development of treatment plan with patient and/or surrogate as well as nursing, discussions with consultants, evaluation of patient's response to treatment, examination of patient, obtaining history from patient or surrogate, ordering and performing treatments and interventions, ordering and review of laboratory studies, ordering and review of radiographic studies, pulse oximetry and re-evaluation of patient's condition.      Orlie Dakin, MD 08/28/14 1705  Addendum 8 PM patient is alert talkative no distress.  Orlie Dakin, MD 08/28/14 2001

## 2014-08-28 NOTE — H&P (Signed)
[facility] History and Physical  [name] [id number] DOB: 05/06/1946 DOA: 08/28/2014  Referring physician:  PCP: Leeanne Rio, PA-C  Specialists:   Chief Complaint: SIRS/Sepsis?  HPI: [name] is a 69 y.o.  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 She had mastectomy on left side on march 3+ chemotherapy by Dr. [name] who works with Dr. Harlow Mares; she has JP drain still in place. She states she has had abdominal pain since the surgery, not above baseline now but more sore today than yesterday. She has had breast cancer twice. 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 She reports baseline cough due to esophageal problem. No abdominal pain Presently. No diarrhea.associated symptoms include lightheadedness. She treated herself with a muscle relaxer and Tylenol immediately prior to coming here. No other associated symptomsShe denies smoking or drinking. She denies difficulty urinating or dysuria. States has been on antibiotics for a month unsure of type. Review of records show patient has been on Diflucan since 3/22- 4/3.  Review of Systems: The patient denies anorexia, fever, weight loss,, vision loss, decreased hearing, hoarseness, chest pain, syncope, dyspnea on exertion, peripheral edema, balance deficits, hemoptysis, abdominal pain, melena, hematochezia, severe indigestion/heartburn, hematuria, incontinence, genital sores, muscle weakness, suspicious skin lesions, transient blindness, difficulty walking, depression, unusual weight change, abnormal bleeding, enlarged lymph nodes, angioedema, and breast masses.    TRAVEL  HISTORY: NA   Consultants:    Procedure/Significant Events:  4/3 PCXR; nondiagnostic for cause    Culture  4/3 blood pending 4/3 urine pending 4/3 fluid from J-P drain left breast  Antibiotics:  Zosyn 4/30>> Vancomycin 4/3>>   DVT prophylaxis:  Lovenox  Devices     LINES / TUBES:     Past Medical History  Diagnosis Date  . GERD (gastroesophageal reflux disease)   . Depression   . Vitamin D deficiency   . Vitamin B12 deficiency   . Lichen sclerosus   . Family history of breast cancer   . Family history of colon cancer   . Family history of pancreatic cancer   . Dyspnea     Normal Spirometry 03/2008 EF 65% BNP normal 11/2007  . Blood dyscrasia     bruises and bleed easily  . History of hiatal hernia   . Complication of anesthesia     went to sleep easily but hard to wake up up until elbow OR in 2010  . Arthritis     "spine" (07/28/2014)  . Chronic back pain     "all over"  . Mild anxiety   . Breast cancer, right breast 1995  . Melanoma 2010    "right elbow; [facility] at [facility]"  . Breast cancer, left breast 07/28/2014   Past Surgical History  Procedure Laterality Date  . Temporomandibular joint surgery Bilateral 1987  . Nissen fundoplication  17/9150  . Melanoma excision Right 2010    From elbow-- Done at [facility]   . Tonsillectomy    . Colonoscopy      Dr Sharlett Iles  . Reconstruction breast immediate / delayed w/ tissue expander Left 07/28/2014  . Hernia repair    . Breast biopsy Left 04/2014  . Mastectomy Right 1996     chemotherapy. pt.  states 13 lymph nodes were removed  . Mastectomy complete / simple w/ sentinel node biopsy Left 07/28/2014  . Bunionectomy Bilateral 1970's  . Mastectomy w/ sentinel node biopsy Left 07/28/2014    Procedure: LEFT MASTECTOMY WITH SENTINEL LYMPH NODE MAPPING;  Surgeon: Autumn Messing III, MD;  Location: [place];  Service: General;  Laterality: Left;  . Breast reconstruction with placement of tissue expander and flex hd (acellular  hydrated dermis) Left 07/28/2014    Procedure: LEFT BREAST RECONSTRUCTION PLACEMENT OF LEFT TISSUE EXPANDER ;  Surgeon: Crissie Reese, MD;  Location: [place];  Service: Plastics;  Laterality: Left;   Social History:  reports that she has never smoked. She has never used smokeless tobacco. She reports that she does not drink alcohol or use illicit drugs. Lives with husband   Allergies  Allergen Reactions  . Ace Inhibitors     REACTION: angioedema  . Morphine And Related     "crazy"  . Codeine Rash  . Sulfonamide Derivatives Rash  . Telmisartan Rash    Family History  Problem Relation Age of Onset  . Stroke Other     F 1st degree relative 57, M 1st degree relative  . Colon cancer Cousin 67    double first cousin  . Colon cancer Cousin 28    double first cousin  . Colon polyps Father     between 74-20  . Dementia Father   . Pancreatic cancer Brother 4  . Colon polyps Brother     between 10-20  . Cancer Paternal Uncle     NOS  . Anemia Paternal Grandfather     pernicious anemia  . Breast cancer Cousin 40    double first cousin  . Breast cancer Cousin 74    paternal cousin    Prior to Admission medications   Medication Sig Start Date End Date Taking? Authorizing Provider  Cholecalciferol (VITAMIN D3) 1000 UNITS CAPS Take 1 capsule by mouth daily.     Yes Historical Provider, MD  citalopram (CELEXA) 10 MG tablet Take 1 tablet (10 mg total) by mouth daily. 06/28/14  Yes Brunetta Jeans, PA-C  Cyanocobalamin (VITAMIN B-12 CR) 1000 MCG TBCR Take 1 tablet by mouth daily.     Yes Historical Provider, MD  furosemide (LASIX) 40 MG tablet Take 1 tablet (40 mg total) by mouth daily. 06/28/14  Yes Brunetta Jeans, PA-C  OVER THE COUNTER MEDICATION Probiotic 90 billion-Take 1 tablet by mouth daily.   Yes Historical Provider, MD  tamoxifen (NOLVADEX) 20 MG tablet Take 1 tablet (20 mg total) by mouth daily. 05/18/14  Yes Chauncey Cruel, MD  fluconazole (DIFLUCAN) 100 MG tablet Take 1  tablet (100 mg total) by mouth daily. Patient not taking: Reported on 08/28/2014 08/16/14   Chauncey Cruel, MD   Physical Exam: Filed Vitals:   08/28/14 1830 08/28/14 1845 08/28/14 2000 08/28/14 2103  BP: 108/58 113/56 115/51 138/68  Pulse: 78 79 80 83  Temp:    99.4 F (37.4 C)  TempSrc:    Oral  Resp: _0 Height:    _1  (1.702 m)  Weight:    83.915 kg (185 lb)  SpO2: 94% 95% 98% 100%     General:  A/O 4, NAD.  Eyes: Pupils equal round reactive to light and accommodation   Neck: Negative lymphadenopathy, negative JVD  Cardiovascular: Regular rhythm and rate, negative murmurs rubs or gallops, normal S1/S2  Respiratory: Clear to auscultation bilateral  Abdomen:  Soft, nontender, nondistended, plus bowel sound  Skin: J-P drain midline inferior left breast, left breast transverse incision healing well, negative erythema, negative warmth to touch, negative pain to palpation  Musculoskeletal: Negative pedal edema    Labs on Admission:  Basic Metabolic Panel:  Recent Labs Lab 08/28/14 1500  NA 138  K 3.5  CL 102  CO2 26  GLUCOSE 134*  BUN 14  CREATININE 0.86  CALCIUM 8.7   Liver Function Tests: No results for input(s): AST, ALT, ALKPHOS, BILITOT, PROT, ALBUMIN in the last 168 hours. No results for input(s): LIPASE, AMYLASE in the last 168 hours. No results for input(s): AMMONIA in the last 168 hours. CBC:  Recent Labs Lab 08/28/14 1500  WBC 16.2*  NEUTROABS 14.1*  HGB 14.4  HCT 42.7  MCV 92.6  PLT 140*   Cardiac Enzymes:  Recent Labs Lab 08/28/14 2300  TROPONINI <0.03    BNP (last 3 results) No results for input(s): BNP in the last 8760 hours.  ProBNP (last 3 results) No results for input(s): PROBNP in the last 8760 hours.  CBG: No results for input(s): GLUCAP in the last 168 hours.  Radiological Exams on Admission: Dg Chest Port 1 View  (if Code Sepsis Called)  08/28/2014   CLINICAL DATA:  Nausea.  Vomiting.  Fever.  EXAM:  PORTABLE CHEST - 1 VIEW  COMPARISON:  07/20/2014  FINDINGS: Surgical clips which project over the lower right chest and upper abdomen. Soft tissue expander projects over the left chest wall. Surgical clips about the left axilla and lateral chest wall.  Midline trachea. Normal heart size. No pleural effusion or pneumothorax. Clear lungs.  IMPRESSION: No acute cardiopulmonary disease.   Electronically Signed   By: Abigail Miyamoto M.D.   On: 08/28/2014 15:34    EKG: Pending  Assessment/Plan Principal Problem:   SIRS (systemic inflammatory response syndrome) Active Problems:   Essential hypertension   ESOPHAGEAL STRICTURE   Hiatal hernia   Fibromyalgia   Dysphagia   Abdominal pain, epigastric   Febrile illness, acute   Sepsis   Breast cancer, left breast  SIRS/sepsis? -Immunocompromised patient with multiple possible areas of infection, J-P drain, abscess left breast, metastatic breast cancer? -Although fluid in J-P drain looks serous, patient continues to have significant amount of drainage from site; abscess? Obtain CT chest with contrast -Although patient doesn't quite meet criteria for sepsis given her recent breast cancer, and immunocompromised state (on tamoxifen) continue empiric antibiotics   Acute febrile illness -See SIRS  Left breast cancer -In the a.m. notify Dr. Lurline Del (hematology oncology) of patient's admission   Esophageal stricture  -S/P Nissen fundoplication 6160, which could explain patient's new onset of tightness in her epigastric area. In addition patient with a history of strictures. -Secondary issue to SIRS/sepsis, however if patient remains for several days would consult GI for possible EGD  Dysphagia -See esophageal stricture  Hiatal hernia -See esophageal stricture  Abdominal pain epigastric -Patient with some nausea, however abdominal pain nonreproducible -Start Protonix 40 mg BID -Zofran QID  -Clear liquid diet  Essential  hypertension -Patient relatively hypotensive we'll hold all BP medication  Depression/ Fibromyalgia -DC Celexa and start Cymbalta 60 mg daily     Code Status: Full  Family Communication: None Disposition Plan: Resolution SIRS/SEPSIS  Time spent: 60 minute  WOOD[place], [place] [facility] Pager [phone]  If 7PM-7AM, please contact night-coverage [email or web address] Password [email or web address] 08/29/2014, 1:43 AM

## 2014-08-28 NOTE — Progress Notes (Signed)
Lovenox per Pharmacy for DVT Prophylaxis    Pharmacy has been consulted from dosing enoxaparin (lovenox) in this patient for DVT prophylaxis.  The pharmacist has reviewed pertinent labs (Hgb _14.4__; PLT_140__), patient weight (_84__kg) and renal function (CrCl___mL/min) and decided that enoxaparin _40_mg SQ Q_24_Hrs is appropriate for this patient.  The pharmacy department will sign off at this time.  Please reconsult pharmacy if status changes or for further issues.  Thank you  Cyndia Diver PharmD, BCPS  08/28/2014, 10:48 PM

## 2014-08-28 NOTE — ED Notes (Signed)
MD at bedside. 

## 2014-08-28 NOTE — Progress Notes (Signed)
Accepted for admission to a med-surg bed, [name], 69 year old woman with a chief complaint N/V, fever, malaise, myalgias.  She had recent left mastectomy by Dr. [name] with JP drain in place.  No complaints related to this.  Temp elevated to 102.1 in ED.  R/O sepsis.  Asked EDP to order influenza panel.  Darrio Bade 08/28/2014 4:56 PM

## 2014-08-28 NOTE — Progress Notes (Signed)
ANTIBIOTIC CONSULT NOTE - INITIAL  Pharmacy Consult for vancomycin/zosyn Indication: rule out sepsis  Allergies  Allergen Reactions  . Ace Inhibitors     REACTION: angioedema  . Morphine And Related     "crazy"  . Codeine Rash  . Sulfonamide Derivatives Rash  . Telmisartan Rash    Patient Measurements: Height: 5\' 7"  (170.2 cm) Weight: 185 lb (83.915 kg) IBW/kg (Calculated) : 61.6 Adjusted Body Weight:    Vital Signs: Temp: 99.9 F (37.7 C) (04/03 1542) Temp Source: Oral (04/03 1542) BP: 132/61 mmHg (04/03 1548) Pulse Rate: 95 (04/03 1548) Intake/Output from previous day:   Intake/Output from this shift:    Labs:  Recent Labs  08/28/14 1500  WBC 16.2*  HGB 14.4  PLT 140*  CREATININE 0.86   Estimated Creatinine Clearance: 69.7 mL/min (by C-G formula based on Cr of 0.86). No results for input(s): VANCOTROUGH, VANCOPEAK, VANCORANDOM, GENTTROUGH, GENTPEAK, GENTRANDOM, TOBRATROUGH, TOBRAPEAK, TOBRARND, AMIKACINPEAK, AMIKACINTROU, AMIKACIN in the last 72 hours.   Microbiology: No results found for this or any previous visit (from the past 720 hour(s)).  Medical History: Past Medical History  Diagnosis Date  . GERD (gastroesophageal reflux disease)   . Depression   . Vitamin D deficiency   . Vitamin B12 deficiency   . Lichen sclerosus   . Family history of breast cancer   . Family history of colon cancer   . Family history of pancreatic cancer   . Dyspnea     Normal Spirometry 03/2008 EF 65% BNP normal 11/2007  . Blood dyscrasia     bruises and bleed easily  . History of hiatal hernia   . Complication of anesthesia     went to sleep easily but hard to wake up up until elbow OR in 2010  . Arthritis     "spine" (07/28/2014)  . Chronic back pain     "all over"  . Mild anxiety   . Breast cancer, right breast 1995  . Melanoma 2010    "right elbow; treated at [facility]"  . Breast cancer, left breast 07/28/2014   Assessment: 69 y.o. female who presents to the  Emergency Department complaining of nausea, vomiting, subjective fever, diffuse muscles aches with onset this morning, worsening this afternoon.   Orders received to start vancomycin and zosyn for sepsis. Tmax 102.1, wbc 16. Scr normal.  Goal of Therapy:  Vancomycin trough level 15-20 mcg/ml  Plan:  Measure antibiotic drug levels at steady state Follow up culture results Zosyn 3.375g IV q8 hours   Vancomycin 1g IV q12 hours  Erin Hearing PharmD., BCPS Clinical Pharmacist Pager [phone] 08/28/2014 3:57 PM

## 2014-08-29 ENCOUNTER — Encounter (HOSPITAL_COMMUNITY): Payer: Self-pay | Admitting: Radiology

## 2014-08-29 ENCOUNTER — Ambulatory Visit (HOSPITAL_COMMUNITY): Payer: Medicare HMO

## 2014-08-29 ENCOUNTER — Encounter: Payer: Self-pay | Admitting: Oncology

## 2014-08-29 DIAGNOSIS — R198 Other specified symptoms and signs involving the digestive system and abdomen: Secondary | ICD-10-CM

## 2014-08-29 DIAGNOSIS — Z170 Estrogen receptor positive status [ER+]: Secondary | ICD-10-CM

## 2014-08-29 DIAGNOSIS — IMO0001 Reserved for inherently not codable concepts without codable children: Secondary | ICD-10-CM | POA: Insufficient documentation

## 2014-08-29 DIAGNOSIS — L0291 Cutaneous abscess, unspecified: Secondary | ICD-10-CM | POA: Insufficient documentation

## 2014-08-29 DIAGNOSIS — R6510 Systemic inflammatory response syndrome (SIRS) of non-infectious origin without acute organ dysfunction: Secondary | ICD-10-CM | POA: Diagnosis present

## 2014-08-29 LAB — URINE CULTURE
Colony Count: NO GROWTH
Culture: NO GROWTH

## 2014-08-29 LAB — LACTIC ACID, PLASMA: LACTIC ACID, VENOUS: 1.1 mmol/L (ref 0.5–2.0)

## 2014-08-29 LAB — COMPREHENSIVE METABOLIC PANEL
ALK PHOS: 40 U/L (ref 39–117)
ALT: 16 U/L (ref 0–35)
ANION GAP: 5 (ref 5–15)
AST: 18 U/L (ref 0–37)
Albumin: 2.7 g/dL — ABNORMAL LOW (ref 3.5–5.2)
BUN: 11 mg/dL (ref 6–23)
CO2: 24 mmol/L (ref 19–32)
Calcium: 7.6 mg/dL — ABNORMAL LOW (ref 8.4–10.5)
Chloride: 109 mmol/L (ref 96–112)
Creatinine, Ser: 0.78 mg/dL (ref 0.50–1.10)
GFR calc non Af Amer: 84 mL/min — ABNORMAL LOW (ref >90)
GLUCOSE: 126 mg/dL — AB (ref 70–99)
POTASSIUM: 3.3 mmol/L — AB (ref 3.5–5.1)
Sodium: 138 mmol/L (ref 135–145)
Total Bilirubin: 0.8 mg/dL (ref 0.3–1.2)
Total Protein: 4.9 g/dL — ABNORMAL LOW (ref 6.0–8.3)

## 2014-08-29 LAB — CBC
HCT: 33.5 % — ABNORMAL LOW (ref 36.0–46.0)
Hemoglobin: 11.2 g/dL — ABNORMAL LOW (ref 12.0–15.0)
MCH: 31.3 pg (ref 26.0–34.0)
MCHC: 33.4 g/dL (ref 30.0–36.0)
MCV: 93.6 fL (ref 78.0–100.0)
PLATELETS: 113 10*3/uL — AB (ref 150–400)
RBC: 3.58 MIL/uL — ABNORMAL LOW (ref 3.87–5.11)
RDW: 12.9 % (ref 11.5–15.5)
WBC: 13.2 10*3/uL — ABNORMAL HIGH (ref 4.0–10.5)

## 2014-08-29 LAB — CORTISOL: CORTISOL PLASMA: 19.6 ug/dL

## 2014-08-29 LAB — INFLUENZA PANEL BY PCR (TYPE A & B)
H1N1 flu by pcr: NOT DETECTED
INFLAPCR: NEGATIVE
Influenza B By PCR: NEGATIVE

## 2014-08-29 LAB — TYPE AND SCREEN
ABO/RH(D): A POS
Antibody Screen: NEGATIVE

## 2014-08-29 LAB — EXPECTORATED SPUTUM ASSESSMENT W GRAM STAIN, RFLX TO RESP C

## 2014-08-29 LAB — EXPECTORATED SPUTUM ASSESSMENT W REFEX TO RESP CULTURE

## 2014-08-29 LAB — ABO/RH: ABO/RH(D): A POS

## 2014-08-29 MED ORDER — IOHEXOL 300 MG/ML  SOLN
80.0000 mL | Freq: Once | INTRAMUSCULAR | Status: AC | PRN
  Administered 2014-08-29: 80 mL via INTRAVENOUS

## 2014-08-29 MED ORDER — POTASSIUM CHLORIDE CRYS ER 20 MEQ PO TBCR
40.0000 meq | EXTENDED_RELEASE_TABLET | ORAL | Status: AC
  Administered 2014-08-29 (×2): 40 meq via ORAL
  Filled 2014-08-29 (×2): qty 2

## 2014-08-29 MED ORDER — ONDANSETRON HCL 4 MG/2ML IJ SOLN
4.0000 mg | Freq: Four times a day (QID) | INTRAMUSCULAR | Status: DC
  Administered 2014-08-29 – 2014-09-01 (×13): 4 mg via INTRAVENOUS
  Filled 2014-08-29 (×13): qty 2

## 2014-08-29 MED ORDER — PANTOPRAZOLE SODIUM 40 MG IV SOLR
40.0000 mg | Freq: Two times a day (BID) | INTRAVENOUS | Status: DC
  Administered 2014-08-29 – 2014-08-30 (×4): 40 mg via INTRAVENOUS
  Filled 2014-08-29 (×5): qty 40

## 2014-08-29 MED ORDER — DULOXETINE HCL 60 MG PO CPEP
60.0000 mg | ORAL_CAPSULE | Freq: Every day | ORAL | Status: DC
  Administered 2014-08-29 – 2014-08-30 (×2): 60 mg via ORAL
  Filled 2014-08-29 (×2): qty 1

## 2014-08-29 MED ORDER — ENSURE PUDDING PO PUDG
1.0000 | Freq: Three times a day (TID) | ORAL | Status: DC
  Administered 2014-08-29 – 2014-09-01 (×6): 1 via ORAL
  Filled 2014-08-29 (×11): qty 1

## 2014-08-29 MED ORDER — POTASSIUM CHLORIDE CRYS ER 20 MEQ PO TBCR: 40.0000 meq | EXTENDED_RELEASE_TABLET | ORAL | Status: DC

## 2014-08-29 NOTE — Progress Notes (Signed)
Patient Demographics  [name], is a 69 y.o. female, DOB - 11-Mar-1946, MVH:[id number]  Admit date - 08/28/2014   Admitting Physician Venetia Maxon Rama, MD  Outpatient Primary MD for the patient is Leeanne Rio, PA-C  LOS - 1   Chief Complaint  Patient presents with  . Code Sepsis/ Post-OP Problem       [place] history of present illness/brief narrative: This is a 69 y.o.S/P Nissen fundoplication 8413, Right breast cancer DX 1996 S/P mastectomy/chemotherapy, melanoma Dx 2010 Dx invasive with lobular features, grade 1 left breast cancer 2015 ER/PR positive, HER-2 negative S/P left mastectomy/tamoxifen  Presents to the Emergency Department complaining of nausea, vomiting, subjective fever, diffuse muscles aches with onset this morning, worsening this afternoon. She had mastectomy on left side on march 3+ chemotherapy by Dr. [name] who works with Dr. Harlow Mares; she has JP drain still in place. She states she has had abdominal pain since the surgery, not above baseline now but more sore today than yesterday. She has had breast cancer twice. 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 She reports baseline cough due to esophageal problem. No abdominal pain Presently. No diarrhea.associated symptoms include lightheadedness. She treated herself with a muscle relaxer and Tylenol immediately prior to coming here. No other associated symptomsShe denies smoking or drinking. She denies difficulty urinating or dysuria. States has been on antibiotics for a month unsure of type. Review of records show patient has been on Diflucan since 3/22- 4/3.   Subjective:   [name] today has, No headache, No chest pain, No abdominal pain - No Nausea, No new weakness tingling or numbness, No Cough - SOB.  Assessment & Plan    Principal Problem:   SIRS (systemic inflammatory response  syndrome) Active Problems:   Essential hypertension   ESOPHAGEAL STRICTURE   Hiatal hernia   Fibromyalgia   Dysphagia   Abdominal pain, epigastric   Febrile illness, acute   Sepsis   Breast cancer, left breast   Abscess   Blood poisoning  SIRS/sepsis - Unclear etiology, patient presents with leukocytosis and fever, negative urinalysis, CT chest with contrast showed no findings of infection. - Continue with broad-spectrum antibiotic pending further workup.  Acute febrile illness -See SIRS  Abdominal pain epigastric, nausea/vomiting. -Patient with some nausea, however abdominal pain nonreproducible, possibly related to gastroenteritis. -Start Protonix 40 mg BID -Zofran QID  -Clear liquid diet  Left breast cancer -following with oncology as an outpatient with Dr. Burman Nieves amount  Esophageal stricture -S/P Nissen fundoplication 2440. need follow-up with GI when stable.  Dysphagia -See esophageal stricture  Hiatal hernia -See esophageal stricture  Essential hypertension - blood pressure is on the lower side on admission, continue to hold medication   depression/fibromyalgia - Celexa and start Cymbalta 60 mg daily   Code Status: full   Family Communication: none at bedside   Disposition Plan: home when stable    Procedures  None    Consults   None    Medications  Scheduled Meds: . DULoxetine  60 mg Oral Daily  . enoxaparin (LOVENOX) injection  40 mg Subcutaneous QHS  . ondansetron (ZOFRAN) IV  4 mg Intravenous 4 times per day  . pantoprazole (PROTONIX) IV  40 mg Intravenous Q12H  .  piperacillin-tazobactam (ZOSYN)  IV  3.375 g Intravenous 3 times per day  . vancomycin  1,000 mg Intravenous Q12H   Continuous Infusions: . sodium chloride 100 mL/hr at 08/29/14 0815   PRN Meds:.  DVT Prophylaxis  Lovenox   Lab Results  Component Value Date   PLT 113* 08/29/2014    Antibiotics    Anti-infectives    Start     Dose/Rate Route Frequency Ordered  Stop   08/29/14 0500  vancomycin (VANCOCIN) IVPB 1000 mg/200 mL premix     1,000 mg 200 mL/hr over 60 Minutes Intravenous Every 12 hours 08/28/14 1559     08/28/14 2245  piperacillin-tazobactam (ZOSYN) IVPB 3.375 g  Status:  Discontinued     3.375 g 100 mL/hr over 30 Minutes Intravenous  Once 08/28/14 2239 08/28/14 2242   08/28/14 2245  vancomycin (VANCOCIN) IVPB 1000 mg/200 mL premix  Status:  Discontinued     1,000 mg 200 mL/hr over 60 Minutes Intravenous  Once 08/28/14 2239 08/28/14 2242   08/28/14 2200  piperacillin-tazobactam (ZOSYN) IVPB 3.375 g     3.375 g 12.5 mL/hr over 240 Minutes Intravenous 3 times per day 08/28/14 1600     08/28/14 1600  piperacillin-tazobactam (ZOSYN) IVPB 3.375 g  Status:  Discontinued     3.375 g 100 mL/hr over 30 Minutes Intravenous STAT 08/28/14 1559 08/28/14 1559   08/28/14 1515  piperacillin-tazobactam (ZOSYN) IVPB 3.375 g     3.375 g 100 mL/hr over 30 Minutes Intravenous  Once 08/28/14 1509 08/28/14 1601   08/28/14 1515  vancomycin (VANCOCIN) IVPB 1000 mg/200 mL premix     1,000 mg 200 mL/hr over 60 Minutes Intravenous  Once 08/28/14 1509 08/28/14 1717          Objective:   Filed Vitals:   08/28/14 1845 08/28/14 2000 08/28/14 2103 08/29/14 0506  BP: 113/56 115/51 138/68 110/45  Pulse: 79 80 83 85  Temp:   99.4 F (37.4 C) 99 F (37.2 C)  TempSrc:   Oral Oral  Resp: 22 17 16 16   Height:   5' 7"  (1.702 m)   Weight:   83.915 kg (185 lb)   SpO2: 95% 98% 100% 97%    Wt Readings from Last 3 Encounters:  08/28/14 83.915 kg (185 lb)  08/16/14 84.142 kg (185 lb 8 oz)  07/28/14 85.276 kg (188 lb)     Intake/Output Summary (Last 24 hours) at 08/29/14 1319 Last data filed at 08/29/14 1013  Gross per 24 hour  Intake   2990 ml  Output    640 ml  Net   2350 ml     Physical Exam  Awake Alert, Oriented X 3, No new F.N deficits, Normal affect [place].AT,PERRAL Supple Neck,No JVD, No cervical lymphadenopathy appriciated.  Symmetrical  Chest wall movement, Good air movement bilaterally, status post bilateral must mastectomy, wound looks clean, no oozing or discharge, as GC drain in mid chest area draining serous fluid. RRR,No Gallops,Rubs or new Murmurs, No Parasternal Heave +ve B.Sounds, Abd Soft, No tenderness, No organomegaly appriciated, No rebound - guarding or rigidity. No Cyanosis, Clubbing or edema, No new Rash or bruise     Data Review   Micro Results Recent Results (from the past 240 hour(s))  Blood culture (routine x 2)     Status: None (Preliminary result)   Collection Time: 08/28/14  2:52 PM  Result Value Ref Range Status   Specimen Description BLOOD RIGHT FOREARM  Final   Special Requests Normal  Final  Culture   Final           BLOOD CULTURE RECEIVED NO GROWTH TO DATE CULTURE WILL BE HELD FOR 5 DAYS BEFORE ISSUING A FINAL NEGATIVE REPORT Performed at [facility]    Report Status PENDING  Incomplete  Blood culture (routine x 2)     Status: None (Preliminary result)   Collection Time: 08/28/14  3:00 PM  Result Value Ref Range Status   Specimen Description BLOOD RIGHT ANTECUBITAL  Final   Special Requests Normal  Final   Culture   Final           BLOOD CULTURE RECEIVED NO GROWTH TO DATE CULTURE WILL BE HELD FOR 5 DAYS BEFORE ISSUING A FINAL NEGATIVE REPORT Performed at [facility]    Report Status PENDING  Incomplete  Culture, sputum-assessment     Status: None   Collection Time: 08/29/14  3:25 AM  Result Value Ref Range Status   Specimen Description SPUTUM  Final   Special Requests NONE  Final   Sputum evaluation   Final    MICROSCOPIC FINDINGS SUGGEST THAT THIS SPECIMEN IS NOT REPRESENTATIVE OF LOWER RESPIRATORY SECRETIONS. PLEASE RECOLLECT. CALLED WAADE/3W @0420  ON 08/29/14 BY KARCZEWSKI,S.    Report Status 08/29/2014 FINAL  Final    Radiology Reports Ct Chest W Contrast  08/29/2014   CLINICAL DATA:  Left breast cancer with recent mastectomy. Fever. Nausea and vomiting,  query abscess. Drain in place.  EXAM: CT CHEST WITH CONTRAST  TECHNIQUE: Multidetector CT imaging of the chest was performed during intravenous contrast administration.  CONTRAST:  73m OMNIPAQUE IOHEXOL 300 MG/ML  SOLN  COMPARISON:  03/15/2014  FINDINGS: Mediastinum/Nodes: No pathologic thoracic adenopathy. Bilateral axillary clips.  Lungs/Pleura: Trace bilateral pleural effusions, left greater than right. Mild biapical pleural parenchymal scarring, right greater than left.  Left upper lobe subpleural lymph node on image 20 of series 5, 0.5 by 0.3 cm, not changed 11/25/2007 and considered benign. 4 mm left lower lobe nodule, image 38 series 5, no change from 11/25/2007. Additional slight left basilar nodularity likewise stable.  Upper abdomen: Diffuse steatosis of the visualized liver. 5 mm right kidney upper pole nonobstructive calculus. Postoperative findings along the gastroesophageal junction.  Musculoskeletal: Thoracic spondylosis.  Left breast expander between the pectoralis muscles. A drain extends along the medial margin of this expander and just superficial to the medial margin of the pectoralis musculature. No abscess identified.  IMPRESSION: 1. No abscess in the vicinity of the drain or tissue expander. 2. Trace bilateral pleural effusions, left greater than right. 3. Small left-sided pulmonary nodules are stable from 2009 and considered benign. 4. Diffuse hepatic steatosis. 5. 5 mm right kidney upper pole nonobstructive calculus.   Electronically Signed   By: WVan ClinesM.D.   On: 08/29/2014 12:58   Dg Chest Port 1 View  (if Code Sepsis Called)  08/28/2014   CLINICAL DATA:  Nausea.  Vomiting.  Fever.  EXAM: PORTABLE CHEST - 1 VIEW  COMPARISON:  07/20/2014  FINDINGS: Surgical clips which project over the lower right chest and upper abdomen. Soft tissue expander projects over the left chest wall. Surgical clips about the left axilla and lateral chest wall.  Midline trachea. Normal heart size. No  pleural effusion or pneumothorax. Clear lungs.  IMPRESSION: No acute cardiopulmonary disease.   Electronically Signed   By: KAbigail MiyamotoM.D.   On: 08/28/2014 15:34    CBC  Recent Labs Lab 08/28/14 1500 08/29/14 0458  WBC 16.2* 13.2*  HGB 14.4 11.2*  HCT 42.7 33.5*  PLT 140* 113*  MCV 92.6 93.6  MCH 31.2 31.3  MCHC 33.7 33.4  RDW 12.8 12.9  LYMPHSABS 1.1  --   MONOABS 1.0  --   EOSABS 0.1  --   BASOSABS 0.0  --     Chemistries   Recent Labs Lab 08/28/14 1500 08/29/14 0458  NA 138 138  K 3.5 3.3*  CL 102 109  CO2 26 24  GLUCOSE 134* 126*  BUN 14 11  CREATININE 0.86 0.78  CALCIUM 8.7 7.6*  AST  --  18  ALT  --  16  ALKPHOS  --  40  BILITOT  --  0.8   ------------------------------------------------------------------------------------------------------------------ estimated creatinine clearance is 74.9 mL/min (by C-G formula based on Cr of 0.78). ------------------------------------------------------------------------------------------------------------------ No results for input(s): HGBA1C in the last 72 hours. ------------------------------------------------------------------------------------------------------------------ No results for input(s): CHOL, HDL, LDLCALC, TRIG, CHOLHDL, LDLDIRECT in the last 72 hours. ------------------------------------------------------------------------------------------------------------------ No results for input(s): TSH, T4TOTAL, T3FREE, THYROIDAB in the last 72 hours.  Invalid input(s): FREET3 ------------------------------------------------------------------------------------------------------------------ No results for input(s): VITAMINB12, FOLATE, FERRITIN, TIBC, IRON, RETICCTPCT in the last 72 hours.  Coagulation profile  Recent Labs Lab 08/28/14 2300  INR 1.08    No results for input(s): DDIMER in the last 72 hours.  Cardiac Enzymes  Recent Labs Lab 08/28/14 2300  TROPONINI <0.03    ------------------------------------------------------------------------------------------------------------------ Invalid input(s): POCBNP     Time Spent in minutes    35 minutes    Merit Gadsby M.D on 08/29/2014 at 1:19 PM  Between 7am to 7pm - Pager - [phone]  After 7pm go to [email or web address] - password [email or web address]  And look for the night coverage person covering for me after hours  [facility] Office  [phone]   **Disclaimer: This note may have been dictated with voice recognition software. Similar sounding words can inadvertently be transcribed and this note may contain transcription errors which may not have been corrected upon publication of note.**

## 2014-08-29 NOTE — Progress Notes (Signed)
[name]   DOB:[birth year]   EX#:[id number]   SWH#:[id number]  Subjective: her abdominal pain is a bit better but not gone-- mostly epigastric, but also "all the way down;" now with frank diarrhea, x 4 in past 12 hours. Tells me fluid in drainage bulb was "milky" yesterday-- it is serosanguinous toda, as expected. No family in room   Objective: middle aged White woman exmained in bed Filed Vitals:   08/29/14 1322  BP: 148/57  Pulse: 83  Temp: 97.8 F (36.6 C)  Resp: 16    Body mass index is 28.97 kg/(m^2).  Intake/Output Summary (Last 24 hours) at 08/29/14 2003 Last data filed at 08/29/14 1600  Gross per 24 hour  Intake    240 ml  Output   1610 ml  Net  -1370 ml   Approximately 25 cc pinkish fluid in JP bulb, easy to see through  CBG (last 3)  No results for input(s): GLUCAP in the last 72 hours.   Labs:  Lab Results  Component Value Date   WBC 13.2* 08/29/2014   HGB 11.2* 08/29/2014   HCT 33.5* 08/29/2014   MCV 93.6 08/29/2014   PLT 113* 08/29/2014   NEUTROABS 14.1* 08/28/2014    _0 @  Urine Studies No results for input(s): UHGB, CRYS in the last 72 hours.  Invalid input(s): UACOL, UAPR, USPG, UPH, UTP, UGL, UKET, UBIL, UNIT, UROB, ULEU, UEPI, UWBC, URBC, UBAC, CAST, UCOM, BILUA  Basic Metabolic Panel:  Recent Labs Lab 08/28/14 1500 08/29/14 0458  NA 138 138  K 3.5 3.3*  CL 102 109  CO2 26 24  GLUCOSE 134* 126*  BUN 14 11  CREATININE 0.86 0.78  CALCIUM 8.7 7.6*   GFR Estimated Creatinine Clearance: 74.9 mL/min (by C-G formula based on Cr of 0.78). Liver Function Tests:  Recent Labs Lab 08/29/14 0458  AST 18  ALT 16  ALKPHOS 40  BILITOT 0.8  PROT 4.9*  ALBUMIN 2.7*   No results for input(s): LIPASE, AMYLASE in the last 168 hours. No results for input(s): AMMONIA in the last 168 hours. Coagulation profile  Recent Labs Lab 08/28/14 2300  INR 1.08    CBC:  Recent Labs Lab 08/28/14 1500 08/29/14 0458  WBC 16.2* 13.2*   NEUTROABS 14.1*  --   HGB 14.4 11.2*  HCT 42.7 33.5*  MCV 92.6 93.6  PLT 140* 113*   Cardiac Enzymes:  Recent Labs Lab 08/28/14 2300  TROPONINI <0.03   BNP: Invalid input(s): POCBNP CBG: No results for input(s): GLUCAP in the last 168 hours. D-Dimer No results for input(s): DDIMER in the last 72 hours. Hgb A1c No results for input(s): HGBA1C in the last 72 hours. Lipid Profile No results for input(s): CHOL, HDL, LDLCALC, TRIG, CHOLHDL, LDLDIRECT in the last 72 hours. Thyroid function studies No results for input(s): TSH, T4TOTAL, T3FREE, THYROIDAB in the last 72 hours.  Invalid input(s): FREET3 Anemia work up No results for input(s): VITAMINB12, FOLATE, FERRITIN, TIBC, IRON, RETICCTPCT in the last 72 hours. Microbiology Recent Results (from the past 240 hour(s))  Urine culture     Status: None   Collection Time: 08/28/14  2:38 PM  Result Value Ref Range Status   Specimen Description URINE, CLEAN CATCH  Final   Special Requests NONE  Final   Colony Count NO GROWTH Performed at [facility]   Final   Culture NO GROWTH Performed at [facility]   Final   Report Status 08/29/2014 FINAL  Final  Blood culture (  routine x 2)     Status: None (Preliminary result)   Collection Time: 08/28/14  2:52 PM  Result Value Ref Range Status   Specimen Description BLOOD RIGHT FOREARM  Final   Special Requests Normal  Final   Culture   Final           BLOOD CULTURE RECEIVED NO GROWTH TO DATE CULTURE WILL BE HELD FOR 5 DAYS BEFORE ISSUING A FINAL NEGATIVE REPORT Performed at [facility]    Report Status PENDING  Incomplete  Blood culture (routine x 2)     Status: None (Preliminary result)   Collection Time: 08/28/14  3:00 PM  Result Value Ref Range Status   Specimen Description BLOOD RIGHT ANTECUBITAL  Final   Special Requests Normal  Final   Culture   Final           BLOOD CULTURE RECEIVED NO GROWTH TO DATE CULTURE WILL BE HELD FOR 5 DAYS BEFORE ISSUING  A FINAL NEGATIVE REPORT Performed at [facility]    Report Status PENDING  Incomplete  Culture, sputum-assessment     Status: None   Collection Time: 08/29/14  3:25 AM  Result Value Ref Range Status   Specimen Description SPUTUM  Final   Special Requests NONE  Final   Sputum evaluation   Final    MICROSCOPIC FINDINGS SUGGEST THAT THIS SPECIMEN IS NOT REPRESENTATIVE OF LOWER RESPIRATORY SECRETIONS. PLEASE RECOLLECT. CALLED WAADE/3W _0  ON 08/29/14 BY KARCZEWSKI,S.    Report Status 08/29/2014 FINAL  Final      Studies:  Ct Chest W Contrast  08/29/2014   CLINICAL DATA:  Left breast cancer with recent mastectomy. Fever. Nausea and vomiting, query abscess. Drain in place.  EXAM: CT CHEST WITH CONTRAST  TECHNIQUE: Multidetector CT imaging of the chest was performed during intravenous contrast administration.  CONTRAST:  3m OMNIPAQUE IOHEXOL 300 MG/ML  SOLN  COMPARISON:  03/15/2014  FINDINGS: Mediastinum/Nodes: No pathologic thoracic adenopathy. Bilateral axillary clips.  Lungs/Pleura: Trace bilateral pleural effusions, left greater than right. Mild biapical pleural parenchymal scarring, right greater than left.  Left upper lobe subpleural lymph node on image 20 of series 5, 0.5 by 0.3 cm, not changed 11/25/2007 and considered benign. 4 mm left lower lobe nodule, image 38 series 5, no change from 11/25/2007. Additional slight left basilar nodularity likewise stable.  Upper abdomen: Diffuse steatosis of the visualized liver. 5 mm right kidney upper pole nonobstructive calculus. Postoperative findings along the gastroesophageal junction.  Musculoskeletal: Thoracic spondylosis.  Left breast expander between the pectoralis muscles. A drain extends along the medial margin of this expander and just superficial to the medial margin of the pectoralis musculature. No abscess identified.  IMPRESSION: 1. No abscess in the vicinity of the drain or tissue expander. 2. Trace bilateral pleural effusions,  left greater than right. 3. Small left-sided pulmonary nodules are stable from 2009 and considered benign. 4. Diffuse hepatic steatosis. 5. 5 mm right kidney upper pole nonobstructive calculus.   Electronically Signed   By: WVan ClinesM.D.   On: 08/29/2014 12:58   Dg Chest Port 1 View  (if Code Sepsis Called)  08/28/2014   CLINICAL DATA:  Nausea.  Vomiting.  Fever.  EXAM: PORTABLE CHEST - 1 VIEW  COMPARISON:  07/20/2014  FINDINGS: Surgical clips which project over the lower right chest and upper abdomen. Soft tissue expander projects over the left chest wall. Surgical clips about the left axilla and lateral chest wall.  Midline trachea. Normal heart size.  No pleural effusion or pneumothorax. Clear lungs.  IMPRESSION: No acute cardiopulmonary disease.   Electronically Signed   By: Abigail Miyamoto M.D.   On: 08/28/2014 15:34    Assessment: 69 y.o. [place] woman status post left breast biopsy 05/09/2014 for a clinical T1-T2, N0 (stage I-II) invasive lobular breast cancer, estrogen receptor 100% positive, progesterone receptor 99% positive, with an MIB-1 of 8%, and no HER-2 amplification.  (1) history of right modified radical mastectomy with TRAM reconstruction 1996, followed by chemotherapy for 6 months; did not receive antiestrogen therapy or radiation therapy adjuvantly  (2) tamoxifen started neoadjuvantly 05/18/2014  (3) status post left mastectomy and sentinel lymph node sampling 07/28/2014 for a pT1c pN0(i+). Stage IA invasive ductal carcinoma, grade 1, with repeat HER-2 again negative. Margins were ample  (4) Oncotype score of 3 predicts an outside the breast risk of recurrence within 10 years of 4% if the patient's only systemic treatment is tamoxifen for 5 years. It also predicts no significant benefit from chemotherapy.  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 Sequencing and rearrangement analysis for the following 32 genes found no deleterious mutations: APC, ATM, BARD1, BMPR1A, BRCA1, BRCA2, BRIP1, CDH1, CDK4, CDKN2A, CHEK2, EPCAM, GREM1, MLH1, MRE11A, MSH2, MSH6, MUTYH, NBN, NF1, PALB2, PMS2, POLD1, POLE, PTEN, RAD50, RAD51D, SMAD4, SMARCA4, STK11, and TP53. (a) Heterozygous MUTYH mutations are not associated with an increased risk for cancer in the patient, however, other family members may be at risk for homozygous mutations and therefore family testing should be considered.     Plan: Not sure what the source of Madolyn's GI complaints are. The good news is there is no evidence of cancer on her scan. I have ordered a C diff pcr. She has a Hx of fundoplication and may need EGD or Ba++ swallow to evaluate as her pain is chiefly epigastric. Do not think we are dealing with a primary reconstruction issue but would make sure her surgeons Drs [name] and Harlow Mares are aware of admission.  Will follow with you.   Chauncey Cruel, MD 08/29/2014  8:03 PM Medical Oncology and Hematology [facility] [address] [place], [place] [address] Tel. [phone]    Fax. [phone]

## 2014-08-30 LAB — BASIC METABOLIC PANEL
Anion gap: 4 — ABNORMAL LOW (ref 5–15)
BUN: 6 mg/dL (ref 6–23)
CALCIUM: 7.9 mg/dL — AB (ref 8.4–10.5)
CO2: 26 mmol/L (ref 19–32)
CREATININE: 0.9 mg/dL (ref 0.50–1.10)
Chloride: 110 mmol/L (ref 96–112)
GFR calc non Af Amer: 64 mL/min — ABNORMAL LOW (ref >90)
GFR, EST AFRICAN AMERICAN: 74 mL/min — AB (ref >90)
Glucose, Bld: 117 mg/dL — ABNORMAL HIGH (ref 70–99)
Potassium: 3.8 mmol/L (ref 3.5–5.1)
Sodium: 140 mmol/L (ref 135–145)

## 2014-08-30 LAB — CBC
HCT: 32.9 % — ABNORMAL LOW (ref 36.0–46.0)
Hemoglobin: 10.9 g/dL — ABNORMAL LOW (ref 12.0–15.0)
MCH: 31.3 pg (ref 26.0–34.0)
MCHC: 33.1 g/dL (ref 30.0–36.0)
MCV: 94.5 fL (ref 78.0–100.0)
PLATELETS: 96 10*3/uL — AB (ref 150–400)
RBC: 3.48 MIL/uL — AB (ref 3.87–5.11)
RDW: 13.1 % (ref 11.5–15.5)
WBC: 6.2 10*3/uL (ref 4.0–10.5)

## 2014-08-30 LAB — CLOSTRIDIUM DIFFICILE BY PCR: CDIFFPCR: NEGATIVE

## 2014-08-30 LAB — VANCOMYCIN, TROUGH: VANCOMYCIN TR: 12.4 ug/mL (ref 10.0–20.0)

## 2014-08-30 MED ORDER — PANTOPRAZOLE SODIUM 40 MG PO TBEC
40.0000 mg | DELAYED_RELEASE_TABLET | Freq: Every day | ORAL | Status: DC
  Administered 2014-08-31 – 2014-09-01 (×2): 40 mg via ORAL
  Filled 2014-08-30 (×2): qty 1

## 2014-08-30 MED ORDER — METRONIDAZOLE 500 MG PO TABS
500.0000 mg | ORAL_TABLET | Freq: Three times a day (TID) | ORAL | Status: DC
  Administered 2014-08-30: 500 mg via ORAL
  Filled 2014-08-30 (×3): qty 1

## 2014-08-30 NOTE — Progress Notes (Signed)
Patient Demographics  [name], is a 69 y.o. female, DOB - [birth year], JXB:[id number]  Admit date - 08/28/2014   Admitting Physician Venetia Maxon Rama, MD  Outpatient Primary MD for the patient is Leeanne Rio, PA-C  LOS - 2   Chief Complaint  Patient presents with  . Code Sepsis/ Post-OP Problem       [place] history of present illness/brief narrative: This is a 69 y.o.S/P Nissen fundoplication 1308, Right breast cancer DX 1996 S/P mastectomy/chemotherapy, melanoma Dx 2010 Dx invasive with lobular features, grade 1 left breast cancer 2015 ER/PR positive, HER-2 negative S/P left mastectomy/tamoxifen  Presents to the Emergency Department complaining of nausea, vomiting, subjective fever, diffuse muscles aches with onset this morning, worsening this afternoon. She had mastectomy on left side on march 3+ chemotherapy by Dr. [name] who works with Dr. Harlow Mares; she has JP drain still in place. She states she has had abdominal pain since the surgery, not above baseline now but more sore today than yesterday. She has had breast cancer twice. 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 She reports baseline cough due to esophageal problem. No abdominal pain Presently. No diarrhea.associated symptoms include lightheadedness. She treated herself with a muscle relaxer and Tylenol immediately prior to coming here. No other associated symptomsShe denies smoking or drinking. She denies difficulty urinating or dysuria. States has been on antibiotics for a month unsure of type. Review of records show patient has been on Diflucan since 3/22- 4/3.   Subjective:   [name] today has, No headache, No chest pain, No abdominal pain - No Nausea, No new weakness tingling or numbness, No Cough - SOB. Had diarrhea multiple episodes overnight.  Assessment & Plan    Principal Problem:   SIRS  (systemic inflammatory response syndrome) Active Problems:   Essential hypertension   ESOPHAGEAL STRICTURE   Hiatal hernia   Fibromyalgia   Dysphagia   Abdominal pain, epigastric   Febrile illness, acute   Sepsis   Breast cancer, left breast   Abscess   Blood poisoning  SIRS/sepsis - Unclear etiology, patient presents with leukocytosis and fever, negative urinalysis, CT chest with contrast showed no findings of infection at recent surgical site. - Initially on IV vancomycin and Zosyn, will discontinue, will observe her off antibiotics. - Blood cultures no growth to date.  Acute febrile illness -See SIRS, a febrile for the last 24 hours.  Abdominal pain epigastric, nausea/vomiting. -Patient with some nausea, however abdominal pain nonreproducible, possibly related to gastroenteritis. -Start Protonix 40 mg BID -Zofran QID  -Related to clear liquid diet, so advanced to regular diet. - Agent had diarrhea, but C. difficile by PCR is negative  Left breast cancer -following with oncology as an outpatient with Dr. Jana Hakim follow-up appreciated. - Resume on tamoxifen  Esophageal stricture -S/P Nissen fundoplication 6578. need follow-up with GI as an outpatient.  Dysphagia -See esophageal stricture  Hiatal hernia -See esophageal stricture  Essential hypertension - blood pressure is on the lower side on admission, currently acceptable, not taking any home medication.    depression/fibromyalgia - Continue with Celexa   Code Status: full   Family Communication: none at bedside   Disposition Plan: home when stable    Procedures  None  Consults   Oncology  Medications  Scheduled Meds: . DULoxetine  60 mg Oral Daily  . enoxaparin (LOVENOX) injection  40 mg Subcutaneous QHS  . feeding supplement (ENSURE)  1 Container Oral TID BM  . metroNIDAZOLE  500 mg Oral 3 times per day  . ondansetron (ZOFRAN) IV  4 mg Intravenous 4 times per day  . pantoprazole  (PROTONIX) IV  40 mg Intravenous Q12H   Continuous Infusions: . sodium chloride 100 mL/hr at 08/30/14 1119   PRN Meds:.  DVT Prophylaxis  Lovenox   Lab Results  Component Value Date   PLT 96* 08/30/2014    Antibiotics    Anti-infectives    Start     Dose/Rate Route Frequency Ordered Stop   08/30/14 1100  metroNIDAZOLE (FLAGYL) tablet 500 mg     500 mg Oral 3 times per day 08/30/14 0943     08/29/14 0500  vancomycin (VANCOCIN) IVPB 1000 mg/200 mL premix  Status:  Discontinued     1,000 mg 200 mL/hr over 60 Minutes Intravenous Every 12 hours 08/28/14 1559 08/30/14 1458   08/28/14 2245  piperacillin-tazobactam (ZOSYN) IVPB 3.375 g  Status:  Discontinued     3.375 g 100 mL/hr over 30 Minutes Intravenous  Once 08/28/14 2239 08/28/14 2242   08/28/14 2245  vancomycin (VANCOCIN) IVPB 1000 mg/200 mL premix  Status:  Discontinued     1,000 mg 200 mL/hr over 60 Minutes Intravenous  Once 08/28/14 2239 08/28/14 2242   08/28/14 2200  piperacillin-tazobactam (ZOSYN) IVPB 3.375 g  Status:  Discontinued     3.375 g 12.5 mL/hr over 240 Minutes Intravenous 3 times per day 08/28/14 1600 08/30/14 1458   08/28/14 1600  piperacillin-tazobactam (ZOSYN) IVPB 3.375 g  Status:  Discontinued     3.375 g 100 mL/hr over 30 Minutes Intravenous STAT 08/28/14 1559 08/28/14 1559   08/28/14 1515  piperacillin-tazobactam (ZOSYN) IVPB 3.375 g     3.375 g 100 mL/hr over 30 Minutes Intravenous  Once 08/28/14 1509 08/28/14 1601   08/28/14 1515  vancomycin (VANCOCIN) IVPB 1000 mg/200 mL premix     1,000 mg 200 mL/hr over 60 Minutes Intravenous  Once 08/28/14 1509 08/28/14 1717          Objective:   Filed Vitals:   08/29/14 1322 08/29/14 2132 08/30/14 0515 08/30/14 1427  BP: 148/57 141/60 149/69 143/56  Pulse: 83 78 68 85  Temp: 97.8 F (36.6 C) 98.8 F (37.1 C) 98.7 F (37.1 C) 98.9 F (37.2 C)  TempSrc: Oral Oral Oral Oral  Resp: 16 18 20 16   Height:      Weight:      SpO2: 99% 97% 95% 97%     Wt Readings from Last 3 Encounters:  08/28/14 83.915 kg (185 lb)  08/16/14 84.142 kg (185 lb 8 oz)  07/28/14 85.276 kg (188 lb)     Intake/Output Summary (Last 24 hours) at 08/30/14 1801 Last data filed at 08/30/14 1427  Gross per 24 hour  Intake    702 ml  Output    485 ml  Net    217 ml     Physical Exam  Awake Alert, Oriented X 3, No new F.N deficits, Normal affect [place].AT,PERRAL Supple Neck,No JVD, No cervical lymphadenopathy appriciated.  Symmetrical Chest wall movement, Good air movement bilaterally, status post bilateral must mastectomy, wound looks clean, no oozing or discharge, as JP drain in mid chest area draining serous fluid. RRR,No Gallops,Rubs or new Murmurs, No Parasternal Heave +ve  B.Sounds, Abd Soft, No tenderness, No organomegaly appriciated, No rebound - guarding or rigidity. No Cyanosis, Clubbing or edema, No new Rash or bruise     Data Review   Micro Results Recent Results (from the past 240 hour(s))  Urine culture     Status: None   Collection Time: 08/28/14  2:38 PM  Result Value Ref Range Status   Specimen Description URINE, CLEAN CATCH  Final   Special Requests NONE  Final   Colony Count NO GROWTH Performed at [facility]   Final   Culture NO GROWTH Performed at [facility]   Final   Report Status 08/29/2014 FINAL  Final  Blood culture (routine x 2)     Status: None (Preliminary result)   Collection Time: 08/28/14  2:52 PM  Result Value Ref Range Status   Specimen Description BLOOD RIGHT FOREARM  Final   Special Requests Normal  Final   Culture   Final           BLOOD CULTURE RECEIVED NO GROWTH TO DATE CULTURE WILL BE HELD FOR 5 DAYS BEFORE ISSUING A FINAL NEGATIVE REPORT Performed at [facility]    Report Status PENDING  Incomplete  Blood culture (routine x 2)     Status: None (Preliminary result)   Collection Time: 08/28/14  3:00 PM  Result Value Ref Range Status   Specimen Description BLOOD RIGHT  ANTECUBITAL  Final   Special Requests Normal  Final   Culture   Final           BLOOD CULTURE RECEIVED NO GROWTH TO DATE CULTURE WILL BE HELD FOR 5 DAYS BEFORE ISSUING A FINAL NEGATIVE REPORT Performed at [facility]    Report Status PENDING  Incomplete  Culture, sputum-assessment     Status: None   Collection Time: 08/29/14  3:25 AM  Result Value Ref Range Status   Specimen Description SPUTUM  Final   Special Requests NONE  Final   Sputum evaluation   Final    MICROSCOPIC FINDINGS SUGGEST THAT THIS SPECIMEN IS NOT REPRESENTATIVE OF LOWER RESPIRATORY SECRETIONS. PLEASE RECOLLECT. CALLED [facility]/3W @0420  ON 08/29/14 BY KARCZEWSKI,S.    Report Status 08/29/2014 FINAL  Final  Clostridium Difficile by PCR     Status: None   Collection Time: 08/30/14  8:30 AM  Result Value Ref Range Status   C difficile by pcr NEGATIVE NEGATIVE Final    Comment: Performed at [facility]  Wound culture     Status: None (Preliminary result)   Collection Time: 08/30/14  9:26 AM  Result Value Ref Range Status   Specimen Description JP DRAINAGE  Final   Special Requests Immunocompromised  Final   Gram Stain   Final    FEW WBC PRESENT, PREDOMINANTLY PMN NO SQUAMOUS EPITHELIAL CELLS SEEN FEW GRAM POSITIVE RODS Performed at [facility]    Culture PENDING  Incomplete   Report Status PENDING  Incomplete    Radiology Reports Ct Chest W Contrast  08/29/2014   CLINICAL DATA:  Left breast cancer with recent mastectomy. Fever. Nausea and vomiting, query abscess. Drain in place.  EXAM: CT CHEST WITH CONTRAST  TECHNIQUE: Multidetector CT imaging of the chest was performed during intravenous contrast administration.  CONTRAST:  14m OMNIPAQUE IOHEXOL 300 MG/ML  SOLN  COMPARISON:  03/15/2014  FINDINGS: Mediastinum/Nodes: No pathologic thoracic adenopathy. Bilateral axillary clips.  Lungs/Pleura: Trace bilateral pleural effusions, left greater than right. Mild biapical pleural parenchymal  scarring, right greater than  left.  Left upper lobe subpleural lymph node on image 20 of series 5, 0.5 by 0.3 cm, not changed 11/25/2007 and considered benign. 4 mm left lower lobe nodule, image 38 series 5, no change from 11/25/2007. Additional slight left basilar nodularity likewise stable.  Upper abdomen: Diffuse steatosis of the visualized liver. 5 mm right kidney upper pole nonobstructive calculus. Postoperative findings along the gastroesophageal junction.  Musculoskeletal: Thoracic spondylosis.  Left breast expander between the pectoralis muscles. A drain extends along the medial margin of this expander and just superficial to the medial margin of the pectoralis musculature. No abscess identified.  IMPRESSION: 1. No abscess in the vicinity of the drain or tissue expander. 2. Trace bilateral pleural effusions, left greater than right. 3. Small left-sided pulmonary nodules are stable from 2009 and considered benign. 4. Diffuse hepatic steatosis. 5. 5 mm right kidney upper pole nonobstructive calculus.   Electronically Signed   By: Van Clines M.D.   On: 08/29/2014 12:58    CBC  Recent Labs Lab 08/28/14 1500 08/29/14 0458 08/30/14 0415  WBC 16.2* 13.2* 6.2  HGB 14.4 11.2* 10.9*  HCT 42.7 33.5* 32.9*  PLT 140* 113* 96*  MCV 92.6 93.6 94.5  MCH 31.2 31.3 31.3  MCHC 33.7 33.4 33.1  RDW 12.8 12.9 13.1  LYMPHSABS 1.1  --   --   MONOABS 1.0  --   --   EOSABS 0.1  --   --   BASOSABS 0.0  --   --     Chemistries   Recent Labs Lab 08/28/14 1500 08/29/14 0458 08/30/14 0415  NA 138 138 140  K 3.5 3.3* 3.8  CL 102 109 110  CO2 26 24 26   GLUCOSE 134* 126* 117*  BUN 14 11 6   CREATININE 0.86 0.78 0.90  CALCIUM 8.7 7.6* 7.9*  AST  --  18  --   ALT  --  16  --   ALKPHOS  --  40  --   BILITOT  --  0.8  --    ------------------------------------------------------------------------------------------------------------------ estimated creatinine clearance is 66.6 mL/min (by C-G  formula based on Cr of 0.9). ------------------------------------------------------------------------------------------------------------------ No results for input(s): HGBA1C in the last 72 hours. ------------------------------------------------------------------------------------------------------------------ No results for input(s): CHOL, HDL, LDLCALC, TRIG, CHOLHDL, LDLDIRECT in the last 72 hours. ------------------------------------------------------------------------------------------------------------------ No results for input(s): TSH, T4TOTAL, T3FREE, THYROIDAB in the last 72 hours.  Invalid input(s): FREET3 ------------------------------------------------------------------------------------------------------------------ No results for input(s): VITAMINB12, FOLATE, FERRITIN, TIBC, IRON, RETICCTPCT in the last 72 hours.  Coagulation profile  Recent Labs Lab 08/28/14 2300  INR 1.08    No results for input(s): DDIMER in the last 72 hours.  Cardiac Enzymes  Recent Labs Lab 08/28/14 2300  TROPONINI <0.03   ------------------------------------------------------------------------------------------------------------------ Invalid input(s): POCBNP     Time Spent in minutes    35 minutes    Shellby Schlink M.D on 08/30/2014 at 6:01 PM  Between 7am to 7pm - Pager - [phone]  After 7pm go to [email or web address] - password [email or web address]  And look for the night coverage person covering for me after hours  [facility] Office  [phone]   **Disclaimer: This note may have been dictated with voice recognition software. Similar sounding words can inadvertently be transcribed and this note may contain transcription errors which may not have been corrected upon publication of note.**

## 2014-08-30 NOTE — Progress Notes (Signed)
ANTIBIOTIC CONSULT NOTE - FOLLOW UP  Pharmacy Consult for Vancomycin and Zosyn Indication: SIRS, sepsis  Allergies  Allergen Reactions  . Ace Inhibitors     REACTION: angioedema  . Morphine And Related     "crazy"  . Codeine Rash  . Sulfonamide Derivatives Rash  . Telmisartan Rash    Patient Measurements: Height: 5\' 7"  (170.2 cm) Weight: 185 lb (83.915 kg) (stated by the patient) IBW/kg (Calculated) : 61.6  Vital Signs: Temp: 98.7 F (37.1 C) (04/05 0515) Temp Source: Oral (04/05 0515) BP: 149/69 mmHg (04/05 0515) Pulse Rate: 68 (04/05 0515) Intake/Output from previous day: 04/04 0701 - 04/05 0700 In: 462 [P.O.:462] Out: 1455 [Urine:1300; Drains:155] Intake/Output from this shift:    Labs:  Recent Labs  08/28/14 1500 08/29/14 0458 08/30/14 0415  WBC 16.2* 13.2* 6.2  HGB 14.4 11.2* 10.9*  PLT 140* 113* 96*  CREATININE 0.86 0.78 0.90   Estimated Creatinine Clearance: 66.6 mL/min (by C-G formula based on Cr of 0.9). No results for input(s): VANCOTROUGH, VANCOPEAK, VANCORANDOM, GENTTROUGH, GENTPEAK, GENTRANDOM, TOBRATROUGH, TOBRAPEAK, TOBRARND, AMIKACINPEAK, AMIKACINTROU, AMIKACIN in the last 72 hours.   Microbiology: Recent Results (from the past 720 hour(s))  Urine culture     Status: None   Collection Time: 08/28/14  2:38 PM  Result Value Ref Range Status   Specimen Description URINE, CLEAN CATCH  Final   Special Requests NONE  Final   Colony Count NO GROWTH Performed at [facility]   Final   Culture NO GROWTH Performed at [facility]   Final   Report Status 08/29/2014 FINAL  Final  Blood culture (routine x 2)     Status: None (Preliminary result)   Collection Time: 08/28/14  2:52 PM  Result Value Ref Range Status   Specimen Description BLOOD RIGHT FOREARM  Final   Special Requests Normal  Final   Culture   Final           BLOOD CULTURE RECEIVED NO GROWTH TO DATE CULTURE WILL BE HELD FOR 5 DAYS BEFORE ISSUING A FINAL NEGATIVE  REPORT Performed at [facility]    Report Status PENDING  Incomplete  Blood culture (routine x 2)     Status: None (Preliminary result)   Collection Time: 08/28/14  3:00 PM  Result Value Ref Range Status   Specimen Description BLOOD RIGHT ANTECUBITAL  Final   Special Requests Normal  Final   Culture   Final           BLOOD CULTURE RECEIVED NO GROWTH TO DATE CULTURE WILL BE HELD FOR 5 DAYS BEFORE ISSUING A FINAL NEGATIVE REPORT Performed at [facility]    Report Status PENDING  Incomplete  Culture, sputum-assessment     Status: None   Collection Time: 08/29/14  3:25 AM  Result Value Ref Range Status   Specimen Description SPUTUM  Final   Special Requests NONE  Final   Sputum evaluation   Final    MICROSCOPIC FINDINGS SUGGEST THAT THIS SPECIMEN IS NOT REPRESENTATIVE OF LOWER RESPIRATORY SECRETIONS. PLEASE RECOLLECT. CALLED [facility]/3W @0420  ON 08/29/14 BY KARCZEWSKI,S.    Report Status 08/29/2014 FINAL  Final    Anti-infectives    Start     Dose/Rate Route Frequency Ordered Stop   08/29/14 0500  vancomycin (VANCOCIN) IVPB 1000 mg/200 mL premix     1,000 mg 200 mL/hr over 60 Minutes Intravenous Every 12 hours 08/28/14 1559     08/28/14 2245  piperacillin-tazobactam (ZOSYN) IVPB 3.375 g  Status:  Discontinued     3.375 g 100 mL/hr over 30 Minutes Intravenous  Once 08/28/14 2239 08/28/14 2242   08/28/14 2245  vancomycin (VANCOCIN) IVPB 1000 mg/200 mL premix  Status:  Discontinued     1,000 mg 200 mL/hr over 60 Minutes Intravenous  Once 08/28/14 2239 08/28/14 2242   08/28/14 2200  piperacillin-tazobactam (ZOSYN) IVPB 3.375 g     3.375 g 12.5 mL/hr over 240 Minutes Intravenous 3 times per day 08/28/14 1600     08/28/14 1600  piperacillin-tazobactam (ZOSYN) IVPB 3.375 g  Status:  Discontinued     3.375 g 100 mL/hr over 30 Minutes Intravenous STAT 08/28/14 1559 08/28/14 1559   08/28/14 1515  piperacillin-tazobactam (ZOSYN) IVPB 3.375 g     3.375 g 100 mL/hr over 30  Minutes Intravenous  Once 08/28/14 1509 08/28/14 1601   08/28/14 1515  vancomycin (VANCOCIN) IVPB 1000 mg/200 mL premix     1,000 mg 200 mL/hr over 60 Minutes Intravenous  Once 08/28/14 1509 08/28/14 1717      Assessment: 37 yoF presents with N/V, fever, diffuse muscle aches. PMHx breast cancer, melanoma with recent L mastectomy 07/28/14 with JP drain still in place.   Rx consulted to dose Vanc/Zosyn for SIRS/sepsis  4/3 >> Zosyn >> 4/3 >> Vancomycin >>   Tmax: 99 WBCs: 13.2K (improved) Renal: SCr 0.78, CrCl 75 CG (78N)  4/3 blood: NGTD 4/3 urine: NGF 4/4 sputum: Needs re-collect  4/5 Cdiff:   Drug level / dose changes info: 4/5 VT at 16:30 = ____ before 5th dose of 1g q12h   Goal of Therapy:  Vancomycin trough level 15-20 mcg/ml  Zosyn dose appropriate for indication and renal function  Plan:   Continue vancomycin 1g IV q12h - check trough tonight  Continue Zosyn 3.375gm IV q8h (4hr extended infusions) Follow up renal function & cultures, de-escalation of therapy as appropriate  Peggyann Juba, PharmD, BCPS Pager: [phone] 08/30/2014,8:47 AM

## 2014-08-31 DIAGNOSIS — K3580 Unspecified acute appendicitis: Secondary | ICD-10-CM | POA: Diagnosis present

## 2014-08-31 LAB — CBC
HCT: 33.5 % — ABNORMAL LOW (ref 36.0–46.0)
Hemoglobin: 11 g/dL — ABNORMAL LOW (ref 12.0–15.0)
MCH: 30.9 pg (ref 26.0–34.0)
MCHC: 32.8 g/dL (ref 30.0–36.0)
MCV: 94.1 fL (ref 78.0–100.0)
PLATELETS: 115 10*3/uL — AB (ref 150–400)
RBC: 3.56 MIL/uL — AB (ref 3.87–5.11)
RDW: 12.9 % (ref 11.5–15.5)
WBC: 5 10*3/uL (ref 4.0–10.5)

## 2014-08-31 LAB — BASIC METABOLIC PANEL
Anion gap: 5 (ref 5–15)
BUN: 6 mg/dL (ref 6–23)
CALCIUM: 8.1 mg/dL — AB (ref 8.4–10.5)
CHLORIDE: 113 mmol/L — AB (ref 96–112)
CO2: 26 mmol/L (ref 19–32)
Creatinine, Ser: 0.86 mg/dL (ref 0.50–1.10)
GFR, EST AFRICAN AMERICAN: 79 mL/min — AB (ref >90)
GFR, EST NON AFRICAN AMERICAN: 68 mL/min — AB (ref >90)
Glucose, Bld: 100 mg/dL — ABNORMAL HIGH (ref 70–99)
Potassium: 3.8 mmol/L (ref 3.5–5.1)
Sodium: 144 mmol/L (ref 135–145)

## 2014-08-31 MED ORDER — AMOXICILLIN-POT CLAVULANATE 875-125 MG PO TABS
1.0000 | ORAL_TABLET | Freq: Two times a day (BID) | ORAL | Status: DC
  Administered 2014-08-31 – 2014-09-01 (×3): 1 via ORAL
  Filled 2014-08-31 (×5): qty 1

## 2014-08-31 NOTE — Progress Notes (Signed)
Patient Demographics  [name], is a 69 y.o. female, DOB - 1946/01/12, RRN:[id number]  Outpatient Primary MD for the patient is Leeanne Rio, PA-C  LOS - 3     Brief narrative: This is a 69 y.o.S/P Nissen fundoplication 3383, Right breast cancer DX 1996 S/P mastectomy/chemotherapy, melanoma Dx 2010 Dx invasive with lobular features, grade 1 left breast cancer 2015 ER/PR positive, HER-2 negative S/P left mastectomy/tamoxifen. She presented with complains of nausea, vomiting, upper abdominal pain. The scan of the chest and upper abdomen was unremarkable.   Subjective:   Patient feels better. Able to tolerate her diet. The fullness in the upper abdomen seems to be improved.   Assessment & Plan    Principal Problem:   SIRS (systemic inflammatory response syndrome) Active Problems:   Essential hypertension   ESOPHAGEAL STRICTURE   Hiatal hernia   Fibromyalgia   Dysphagia   Abdominal pain, epigastric   Febrile illness, acute   Sepsis   Breast cancer, left breast   Abscess   Blood poisoning   SIRS/sepsis - Unclear etiology, patient presents with leukocytosis and fever, negative urinalysis, CT chest with contrast showed no findings of infection at recent surgical site. Upper abdominal area on the CT scan without any significant findings. - Initially on IV vancomycin and Zosyn. All cultures were negative. Antibiotics were discontinued on 4/5. Patient doing well. No fever. Continue to monitor for another day.  Abdominal pain epigastric, nausea/vomiting. -Symptoms are improved. She is status post fundoplication in the past. Oncologist recommended getting surgical opinion. We will do so.  -Protonix 40 mg BID -Zofran QID  -Seems to be tolerating her regular diet.  - Agent had diarrhea, but C. difficile by PCR is negative  Left breast  cancer -following with oncology as an outpatient with Dr. Jana Hakim - on tamoxifen  Dysphagia -Symptoms improved. We'll await surgical opinion. Can be pursued as an outpatient.  Essential hypertension - blood pressure was on the lower side on admission, currently acceptable, not taking any home medication.   Depression/fibromyalgia - Continue with Celexa  DVT prophylaxis: Lovenox Code Status: full  Family Communication: Discussed with patient Disposition Plan: home when stable    Procedures  None   Consults   Oncology General surgery  Medications  Scheduled Meds: . enoxaparin (LOVENOX) injection  40 mg Subcutaneous QHS  . feeding supplement (ENSURE)  1 Container Oral TID BM  . ondansetron (ZOFRAN) IV  4 mg Intravenous 4 times per day  . pantoprazole  40 mg Oral Daily   Continuous Infusions: . sodium chloride 50 mL/hr at 08/30/14 2157   PRN Meds:.   Antibiotics    Anti-infectives    Start     Dose/Rate Route Frequency Ordered Stop   08/30/14 1100  metroNIDAZOLE (FLAGYL) tablet 500 mg  Status:  Discontinued     500 mg Oral 3 times per day 08/30/14 0943 08/30/14 1808   08/29/14 0500  vancomycin (VANCOCIN) IVPB 1000 mg/200 mL premix  Status:  Discontinued     1,000 mg 200 mL/hr over 60 Minutes Intravenous Every 12 hours 08/28/14 1559 08/30/14 1458   08/28/14 2245  piperacillin-tazobactam (ZOSYN) IVPB 3.375 g  Status:  Discontinued     3.375 g 100 mL/hr over 30 Minutes  Intravenous  Once 08/28/14 2239 08/28/14 2242   08/28/14 2245  vancomycin (VANCOCIN) IVPB 1000 mg/200 mL premix  Status:  Discontinued     1,000 mg 200 mL/hr over 60 Minutes Intravenous  Once 08/28/14 2239 08/28/14 2242   08/28/14 2200  piperacillin-tazobactam (ZOSYN) IVPB 3.375 g  Status:  Discontinued     3.375 g 12.5 mL/hr over 240 Minutes Intravenous 3 times per day 08/28/14 1600 08/30/14 1458   08/28/14 1600  piperacillin-tazobactam (ZOSYN) IVPB 3.375 g  Status:  Discontinued     3.375 g 100  mL/hr over 30 Minutes Intravenous STAT 08/28/14 1559 08/28/14 1559   08/28/14 1515  piperacillin-tazobactam (ZOSYN) IVPB 3.375 g     3.375 g 100 mL/hr over 30 Minutes Intravenous  Once 08/28/14 1509 08/28/14 1601   08/28/14 1515  vancomycin (VANCOCIN) IVPB 1000 mg/200 mL premix     1,000 mg 200 mL/hr over 60 Minutes Intravenous  Once 08/28/14 1509 08/28/14 1717          Objective:   Filed Vitals:   08/30/14 0515 08/30/14 1427 08/30/14 2217 08/31/14 0640  BP: 149/69 143/56 154/71 163/66  Pulse: 68 85 75 73  Temp: 98.7 F (37.1 C) 98.9 F (37.2 C) 98.4 F (36.9 C) 98.3 F (36.8 C)  TempSrc: Oral Oral Oral Oral  Resp: 20 16 20 20   Height:      Weight:      SpO2: 95% 97% 99% 98%    Wt Readings from Last 3 Encounters:  08/28/14 83.915 kg (185 lb)  08/16/14 84.142 kg (185 lb 8 oz)  07/28/14 85.276 kg (188 lb)     Intake/Output Summary (Last 24 hours) at 08/31/14 1023 Last data filed at 08/31/14 0815  Gross per 24 hour  Intake    480 ml  Output    100 ml  Net    380 ml     Physical Exam  Awake Alert, Oriented X 3. No distress Lungs are clear to station bilaterally. No wheezing, rales or rhonchi. S1, S2 is normal, regular. No S3, S4. No rubs, murmurs, or bruit. Left chest wall reveals incision site which appears to be healing. There is a JP drain coming out of the anterior chest wall. Abdomen is soft, nontender, nondistended. Bowel sounds are present. No masses or organomegaly.  Data:   Micro Results Recent Results (from the past 240 hour(s))  Urine culture     Status: None   Collection Time: 08/28/14  2:38 PM  Result Value Ref Range Status   Specimen Description URINE, CLEAN CATCH  Final   Special Requests NONE  Final   Colony Count NO GROWTH Performed at [facility]   Final   Culture NO GROWTH Performed at [facility]   Final   Report Status 08/29/2014 FINAL  Final  Blood culture (routine x 2)     Status: None (Preliminary result)    Collection Time: 08/28/14  2:52 PM  Result Value Ref Range Status   Specimen Description BLOOD RIGHT FOREARM  Final   Special Requests Normal  Final   Culture   Final           BLOOD CULTURE RECEIVED NO GROWTH TO DATE CULTURE WILL BE HELD FOR 5 DAYS BEFORE ISSUING A FINAL NEGATIVE REPORT Performed at [facility]    Report Status PENDING  Incomplete  Blood culture (routine x 2)     Status: None (Preliminary result)   Collection Time: 08/28/14  3:00 PM  Result Value Ref Range Status   Specimen Description BLOOD RIGHT ANTECUBITAL  Final   Special Requests Normal  Final   Culture   Final           BLOOD CULTURE RECEIVED NO GROWTH TO DATE CULTURE WILL BE HELD FOR 5 DAYS BEFORE ISSUING A FINAL NEGATIVE REPORT Performed at [facility]    Report Status PENDING  Incomplete  Culture, sputum-assessment     Status: None   Collection Time: 08/29/14  3:25 AM  Result Value Ref Range Status   Specimen Description SPUTUM  Final   Special Requests NONE  Final   Sputum evaluation   Final    MICROSCOPIC FINDINGS SUGGEST THAT THIS SPECIMEN IS NOT REPRESENTATIVE OF LOWER RESPIRATORY SECRETIONS. PLEASE RECOLLECT. CALLED [facility]/3W @0420  ON 08/29/14 BY KARCZEWSKI,S.    Report Status 08/29/2014 FINAL  Final  Clostridium Difficile by PCR     Status: None   Collection Time: 08/30/14  8:30 AM  Result Value Ref Range Status   C difficile by pcr NEGATIVE NEGATIVE Final    Comment: Performed at [facility]  Wound culture     Status: None (Preliminary result)   Collection Time: 08/30/14  9:26 AM  Result Value Ref Range Status   Specimen Description JP DRAINAGE  Final   Special Requests Immunocompromised  Final   Gram Stain   Final    FEW WBC PRESENT, PREDOMINANTLY PMN NO SQUAMOUS EPITHELIAL CELLS SEEN FEW GRAM POSITIVE RODS Performed at [facility]    Culture   Final    NO GROWTH 1 DAY Performed at [facility]    Report Status PENDING  Incomplete     Radiology Reports Ct Chest W Contrast  08/29/2014   CLINICAL DATA:  Left breast cancer with recent mastectomy. Fever. Nausea and vomiting, query abscess. Drain in place.  EXAM: CT CHEST WITH CONTRAST  TECHNIQUE: Multidetector CT imaging of the chest was performed during intravenous contrast administration.  CONTRAST:  84m OMNIPAQUE IOHEXOL 300 MG/ML  SOLN  COMPARISON:  03/15/2014  FINDINGS: Mediastinum/Nodes: No pathologic thoracic adenopathy. Bilateral axillary clips.  Lungs/Pleura: Trace bilateral pleural effusions, left greater than right. Mild biapical pleural parenchymal scarring, right greater than left.  Left upper lobe subpleural lymph node on image 20 of series 5, 0.5 by 0.3 cm, not changed 11/25/2007 and considered benign. 4 mm left lower lobe nodule, image 38 series 5, no change from 11/25/2007. Additional slight left basilar nodularity likewise stable.  Upper abdomen: Diffuse steatosis of the visualized liver. 5 mm right kidney upper pole nonobstructive calculus. Postoperative findings along the gastroesophageal junction.  Musculoskeletal: Thoracic spondylosis.  Left breast expander between the pectoralis muscles. A drain extends along the medial margin of this expander and just superficial to the medial margin of the pectoralis musculature. No abscess identified.  IMPRESSION: 1. No abscess in the vicinity of the drain or tissue expander. 2. Trace bilateral pleural effusions, left greater than right. 3. Small left-sided pulmonary nodules are stable from 2009 and considered benign. 4. Diffuse hepatic steatosis. 5. 5 mm right kidney upper pole nonobstructive calculus.   Electronically Signed   By: WVan ClinesM.D.   On: 08/29/2014 12:58    CBC  Recent Labs Lab 08/28/14 1500 08/29/14 0458 08/30/14 0415 08/31/14 0410  WBC 16.2* 13.2* 6.2 5.0  HGB 14.4 11.2* 10.9* 11.0*  HCT 42.7 33.5* 32.9* 33.5*  PLT 140* 113* 96* 115*  MCV 92.6 93.6 94.5 94.1  MCH 31.2 31.3 31.3  30.9  MCHC  33.7 33.4 33.1 32.8  RDW 12.8 12.9 13.1 12.9  LYMPHSABS 1.1  --   --   --   MONOABS 1.0  --   --   --   EOSABS 0.1  --   --   --   BASOSABS 0.0  --   --   --     Chemistries   Recent Labs Lab 08/28/14 1500 08/29/14 0458 08/30/14 0415 08/31/14 0410  NA 138 138 140 144  K 3.5 3.3* 3.8 3.8  CL 102 109 110 113*  CO2 26 24 26 26   GLUCOSE 134* 126* 117* 100*  BUN 14 11 6 6   CREATININE 0.86 0.78 0.90 0.86  CALCIUM 8.7 7.6* 7.9* 8.1*  AST  --  18  --   --   ALT  --  16  --   --   ALKPHOS  --  40  --   --   BILITOT  --  0.8  --   --    Coagulation profile  Recent Labs Lab 08/28/14 2300  INR 1.08   Cardiac Enzymes  Recent Labs Lab 08/28/14 2300  TROPONINI <0.03   Time Spent in minutes    80 minutes   Parag Dorton M.D on 08/31/2014 at 10:23 AM  Between 7am to 7pm - Pager - [phone]  After 7pm go to [email or web address] - password [email or web address]  And look for the night coverage person covering for me after hours  [facility] Office  [phone]

## 2014-08-31 NOTE — Consult Note (Signed)
Reason for Consult: abdominal pain Referring Physician: Dr. Curly Rim  [name] is an 69 y.o. female.  HPI: 69 y/o who presented to the ED on 08/28/14 with abdominal pain, nausea, and some vomiting since her breast surgery on 07/28/14 by Dr. [name] and Dr. Harlow Mares.  On day of admit she complained of being, "more sore."  She underwent LEFT MASTECTOMY WITH SENTINEL LYMPH NODE MAPPING; Surgeon: Autumn Messing III, and left breast reconstruction/placement of tissue expander with drain Dr. Harlow Mares 07/28/14.   She had a near syncopal episode during ED physician exam. Since being admitted to the hospital she is feeling much better.  Past Medical History  Diagnosis Date  . GERD (gastroesophageal reflux disease)   . Depression   . Vitamin D deficiency   . Vitamin B12 deficiency   . Lichen sclerosus   . Family history of breast cancer   . Family history of colon cancer   . Family history of pancreatic cancer   . Dyspnea     Normal Spirometry 03/2008 EF 65% BNP normal 11/2007  . Blood dyscrasia     bruises and bleed easily  . History of hiatal hernia   . Complication of anesthesia     went to sleep easily but hard to wake up up until elbow OR in 2010  . Arthritis     "spine" (07/28/2014)  . Chronic back pain     "all over"  . Mild anxiety   . Breast cancer, right breast 1995  . Melanoma 2010    "right elbow; treated at [facility]"  . Breast cancer, left breast 07/28/2014    Past Surgical History  Procedure Laterality Date  . Temporomandibular joint surgery Bilateral 1987  . Nissen fundoplication  12/6759  . Melanoma excision Right 2010    From elbow-- Done at [facility]   . Tonsillectomy    . Colonoscopy      Dr Sharlett Iles  . Reconstruction breast immediate / delayed w/ tissue expander Left 07/28/2014  . Hernia repair    . Breast biopsy Left 04/2014  . Mastectomy Right 1996     chemotherapy. pt. states 13 lymph nodes were removed  . Mastectomy complete / simple w/ sentinel node biopsy Left 07/28/2014  .  Bunionectomy Bilateral 1970's  . Mastectomy w/ sentinel node biopsy Left 07/28/2014    Procedure: LEFT MASTECTOMY WITH SENTINEL LYMPH NODE MAPPING;  Surgeon: Autumn Messing III, MD;  Location: [facility];  Service: General;  Laterality: Left;  . Breast reconstruction with placement of tissue expander and flex hd (acellular hydrated dermis) Left 07/28/2014    Procedure: LEFT BREAST RECONSTRUCTION PLACEMENT OF LEFT TISSUE EXPANDER ;  Surgeon: Crissie Reese, MD;  Location: [place];  Service: Plastics;  Laterality: Left;    Family History  Problem Relation Age of Onset  . Stroke Other     F 1st degree relative 4, M 1st degree relative  . Colon cancer Cousin 22    double first cousin  . Colon cancer Cousin 90    double first cousin  . Colon polyps Father     between 69-20  . Dementia Father   . Pancreatic cancer Brother 85  . Colon polyps Brother     between 10-20  . Cancer Paternal Uncle     NOS  . Anemia Paternal Grandfather     pernicious anemia  . Breast cancer Cousin 48    double first cousin  . Breast cancer Cousin 74    paternal cousin  Social History:  reports that she has never smoked. She has never used smokeless tobacco. She reports that she does not drink alcohol or use illicit drugs.  Allergies:  Allergies  Allergen Reactions  . Ace Inhibitors     REACTION: angioedema  . Morphine And Related     "crazy"  . Codeine Rash  . Sulfonamide Derivatives Rash  . Telmisartan Rash    Medications: I have reviewed the patient's current medications.  Results for orders placed or performed during the hospital encounter of 08/28/14 (from the past 48 hour(s))  CBC     Status: Abnormal   Collection Time: 08/30/14  4:15 AM  Result Value Ref Range   WBC 6.2 4.0 - 10.5 K/uL   RBC 3.48 (L) 3.87 - 5.11 MIL/uL   Hemoglobin 10.9 (L) 12.0 - 15.0 g/dL   HCT 32.9 (L) 36.0 - 46.0 %   MCV 94.5 78.0 - 100.0 fL   MCH 31.3 26.0 - 34.0 pg   MCHC 33.1 30.0 - 36.0 g/dL   RDW 13.1 11.5 - 15.5 %    Platelets 96 (L) 150 - 400 K/uL    Comment: CONSISTENT WITH PREVIOUS RESULT  Basic metabolic panel     Status: Abnormal   Collection Time: 08/30/14  4:15 AM  Result Value Ref Range   Sodium 140 135 - 145 mmol/L   Potassium 3.8 3.5 - 5.1 mmol/L   Chloride 110 96 - 112 mmol/L   CO2 26 19 - 32 mmol/L   Glucose, Bld 117 (H) 70 - 99 mg/dL   BUN 6 6 - 23 mg/dL   Creatinine, Ser 0.90 0.50 - 1.10 mg/dL   Calcium 7.9 (L) 8.4 - 10.5 mg/dL   GFR calc non Af Amer 64 (L) >90 mL/min   GFR calc Af Amer 74 (L) >90 mL/min    Comment: (NOTE) The eGFR has been calculated using the CKD EPI equation. This calculation has not been validated in all clinical situations. eGFR's persistently <90 mL/min signify possible Chronic Kidney Disease.    Anion gap 4 (L) 5 - 15  Clostridium Difficile by PCR     Status: None   Collection Time: 08/30/14  8:30 AM  Result Value Ref Range   C difficile by pcr NEGATIVE NEGATIVE    Comment: Performed at [facility]  Wound culture     Status: None (Preliminary result)   Collection Time: 08/30/14  9:26 AM  Result Value Ref Range   Specimen Description JP DRAINAGE    Special Requests Immunocompromised    Gram Stain      FEW WBC PRESENT, PREDOMINANTLY PMN NO SQUAMOUS EPITHELIAL CELLS SEEN FEW GRAM POSITIVE RODS Performed at [facility]    Culture      NO GROWTH 1 DAY Performed at [facility]    Report Status PENDING   Vancomycin, trough     Status: None   Collection Time: 08/30/14  4:28 PM  Result Value Ref Range   Vancomycin Tr 12.4 10.0 - 20.0 ug/mL  CBC     Status: Abnormal   Collection Time: 08/31/14  4:10 AM  Result Value Ref Range   WBC 5.0 4.0 - 10.5 K/uL   RBC 3.56 (L) 3.87 - 5.11 MIL/uL   Hemoglobin 11.0 (L) 12.0 - 15.0 g/dL   HCT 33.5 (L) 36.0 - 46.0 %   MCV 94.1 78.0 - 100.0 fL   MCH 30.9 26.0 - 34.0 pg   MCHC 32.8 30.0 - 36.0 g/dL  RDW 12.9 11.5 - 15.5 %   Platelets 115 (L) 150 - 400 K/uL    Comment: CONSISTENT WITH  PREVIOUS RESULT  Basic metabolic panel     Status: Abnormal   Collection Time: 08/31/14  4:10 AM  Result Value Ref Range   Sodium 144 135 - 145 mmol/L   Potassium 3.8 3.5 - 5.1 mmol/L   Chloride 113 (H) 96 - 112 mmol/L   CO2 26 19 - 32 mmol/L   Glucose, Bld 100 (H) 70 - 99 mg/dL   BUN 6 6 - 23 mg/dL   Creatinine, Ser 0.86 0.50 - 1.10 mg/dL   Calcium 8.1 (L) 8.4 - 10.5 mg/dL   GFR calc non Af Amer 68 (L) >90 mL/min   GFR calc Af Amer 79 (L) >90 mL/min    Comment: (NOTE) The eGFR has been calculated using the CKD EPI equation. This calculation has not been validated in all clinical situations. eGFR's persistently <90 mL/min signify possible Chronic Kidney Disease.    Anion gap 5 5 - 15    Ct Chest W Contrast  08/29/2014   CLINICAL DATA:  Left breast cancer with recent mastectomy. Fever. Nausea and vomiting, query abscess. Drain in place.  EXAM: CT CHEST WITH CONTRAST  TECHNIQUE: Multidetector CT imaging of the chest was performed during intravenous contrast administration.  CONTRAST:  80m OMNIPAQUE IOHEXOL 300 MG/ML  SOLN  COMPARISON:  03/15/2014  FINDINGS: Mediastinum/Nodes: No pathologic thoracic adenopathy. Bilateral axillary clips.  Lungs/Pleura: Trace bilateral pleural effusions, left greater than right. Mild biapical pleural parenchymal scarring, right greater than left.  Left upper lobe subpleural lymph node on image 20 of series 5, 0.5 by 0.3 cm, not changed 11/25/2007 and considered benign. 4 mm left lower lobe nodule, image 38 series 5, no change from 11/25/2007. Additional slight left basilar nodularity likewise stable.  Upper abdomen: Diffuse steatosis of the visualized liver. 5 mm right kidney upper pole nonobstructive calculus. Postoperative findings along the gastroesophageal junction.  Musculoskeletal: Thoracic spondylosis.  Left breast expander between the pectoralis muscles. A drain extends along the medial margin of this expander and just superficial to the medial margin of  the pectoralis musculature. No abscess identified.  IMPRESSION: 1. No abscess in the vicinity of the drain or tissue expander. 2. Trace bilateral pleural effusions, left greater than right. 3. Small left-sided pulmonary nodules are stable from 2009 and considered benign. 4. Diffuse hepatic steatosis. 5. 5 mm right kidney upper pole nonobstructive calculus.   Electronically Signed   By: WVan ClinesM.D.   On: 08/29/2014 12:58    Review of Systems  Constitutional: Positive for malaise/fatigue.  HENT: Negative.   Eyes: Negative.   Respiratory: Negative.   Cardiovascular: Negative.   Gastrointestinal: Positive for vomiting and abdominal pain.  Genitourinary: Negative.   Musculoskeletal: Negative.   Skin: Negative.   Neurological: Negative.   Endo/Heme/Allergies: Negative.   Psychiatric/Behavioral: Negative.    Blood pressure 163/66, pulse 73, temperature 98.3 F (36.8 C), temperature source Oral, resp. rate 20, height 5' 7"  (1.702 m), weight 83.915 kg (185 lb), SpO2 98 %. Physical Exam  Constitutional: She is oriented to person, place, and time. She appears well-developed and well-nourished.  HENT:  Head: Normocephalic and atraumatic.  Eyes: Conjunctivae and EOM are normal. Pupils are equal, round, and reactive to light.  Neck: Normal range of motion. Neck supple.  Cardiovascular: Normal rate, regular rhythm and normal heart sounds.   Respiratory: Effort normal and breath sounds normal.  Mastectomy site looks  good and drain output is serous. No sign of infection  GI: Soft. Bowel sounds are normal.  Currently nontender.  Musculoskeletal: Normal range of motion.  Neurological: She is alert and oriented to person, place, and time.  Skin: Skin is warm and dry.  Psychiatric: She has a normal mood and affect. Her behavior is normal.    Assessment/Plan: The patient appears to have been affected by some sort of infection but the source of this is unclear. Her mastectomy site looks  good so at this point she will need to follow up with Dr. Harlow Mares about her drains once she goes home. She has responded well to antibiotics so she should probably finish a short course but this could be done orally as an outpatient. She seems to be tolerating her diet now. I will plan to see her back in our clinic in the next couple weeks to check her progress.  JENNINGS,WILLARD 08/31/2014, 10:42 AM

## 2014-09-01 DIAGNOSIS — R21 Rash and other nonspecific skin eruption: Secondary | ICD-10-CM

## 2014-09-01 LAB — CBC
HEMATOCRIT: 32.4 % — AB (ref 36.0–46.0)
HEMOGLOBIN: 10.7 g/dL — AB (ref 12.0–15.0)
MCH: 31 pg (ref 26.0–34.0)
MCHC: 33 g/dL (ref 30.0–36.0)
MCV: 93.9 fL (ref 78.0–100.0)
Platelets: 118 10*3/uL — ABNORMAL LOW (ref 150–400)
RBC: 3.45 MIL/uL — AB (ref 3.87–5.11)
RDW: 13 % (ref 11.5–15.5)
WBC: 5.4 10*3/uL (ref 4.0–10.5)

## 2014-09-01 LAB — WOUND CULTURE

## 2014-09-01 LAB — BASIC METABOLIC PANEL
ANION GAP: 8 (ref 5–15)
BUN: 7 mg/dL (ref 6–23)
CO2: 22 mmol/L (ref 19–32)
CREATININE: 0.82 mg/dL (ref 0.50–1.10)
Calcium: 7.9 mg/dL — ABNORMAL LOW (ref 8.4–10.5)
Chloride: 113 mmol/L — ABNORMAL HIGH (ref 96–112)
GFR calc non Af Amer: 72 mL/min — ABNORMAL LOW (ref >90)
GFR, EST AFRICAN AMERICAN: 83 mL/min — AB (ref >90)
Glucose, Bld: 100 mg/dL — ABNORMAL HIGH (ref 70–99)
Potassium: 3.7 mmol/L (ref 3.5–5.1)
SODIUM: 143 mmol/L (ref 135–145)

## 2014-09-01 MED ORDER — AMOXICILLIN-POT CLAVULANATE 875-125 MG PO TABS: 1.0000 | ORAL_TABLET | Freq: Two times a day (BID) | ORAL | Status: DC

## 2014-09-01 MED ORDER — FLUCONAZOLE 100 MG PO TABS: 100.0000 mg | ORAL_TABLET | Freq: Every day | ORAL | Status: DC

## 2014-09-01 MED ORDER — OMEPRAZOLE 20 MG PO CPDR: 20.0000 mg | DELAYED_RELEASE_CAPSULE | Freq: Two times a day (BID) | ORAL | Status: DC

## 2014-09-01 MED ORDER — HYDRALAZINE HCL 20 MG/ML IJ SOLN
10.0000 mg | Freq: Once | INTRAMUSCULAR | Status: DC
  Filled 2014-09-01: qty 0.5

## 2014-09-01 MED ORDER — FLUCONAZOLE 100 MG PO TABS
100.0000 mg | ORAL_TABLET | Freq: Every day | ORAL | Status: DC
  Administered 2014-09-01: 100 mg via ORAL
  Filled 2014-09-01: qty 1

## 2014-09-01 MED ORDER — ONDANSETRON 4 MG PO TBDP: 4.0000 mg | ORAL_TABLET | Freq: Three times a day (TID) | ORAL | Status: DC | PRN

## 2014-09-01 MED ORDER — FUROSEMIDE 40 MG PO TABS
40.0000 mg | ORAL_TABLET | Freq: Every day | ORAL | Status: DC
  Administered 2014-09-01: 40 mg via ORAL
  Filled 2014-09-01: qty 1

## 2014-09-01 NOTE — Discharge Summary (Signed)
[facility]  Physician Discharge Summary   Patient ID: TORII ROYSE MRN: [id number] DOB/AGE: 1946/02/06 69 y.o.  Admit date: 08/28/2014 Discharge date: 09/01/2014  PCP: Leeanne Rio, PA-C  DISCHARGE DIAGNOSES:  Principal Problem:   SIRS (systemic inflammatory response syndrome) Active Problems:   Essential hypertension   ESOPHAGEAL STRICTURE   Hiatal hernia   Fibromyalgia   Dysphagia   Abdominal pain, epigastric   Febrile illness, acute   Sepsis   Breast cancer, left breast   Abscess   Blood poisoning   Acute appendicitis   RECOMMENDATIONS FOR OUTPATIENT FOLLOW UP: 1. Patient has been asked to follow-up with her gastroenterologist regarding her swallowing difficulties and upper abdominal fullness. 2. She's been asked to follow-up with her surgeon regarding the rash over her mastectomy site. 3. Other nonspecific findings on the CT scan reviewed. Can be pursued as an outpatient.  DISCHARGE CONDITION: fair  Diet recommendation: As before  [facility]   08/28/14 1437 08/28/14 2103  Weight: 83.915 kg (185 lb) 83.915 kg (185 lb)    INITIAL HISTORY: This is a 69 y.o.S/P Nissen fundoplication 7897, Right breast cancer DX 1996 S/P mastectomy/chemotherapy, melanoma Dx 2010 Dx invasive with lobular features, grade 1 left breast cancer 2015 ER/PR positive, HER-2 negative S/P left mastectomy/tamoxifen. She presented with complains of nausea, vomiting, upper abdominal pain. The scan of the chest and upper abdomen was unremarkable.  Consultations:  General surgery  Oncology  Procedures:  None  HOSPITAL COURSE:   SIRS/sepsis Patient presents with leukocytosis and fever. Workup was negative including UA, CT of the chest. She was initially placed on broad-spectrum antibiotics with vancomycin and Zosyn. Cultures have all come back negative. Fever subsided on its own. General surgery was consulted who recommended a few more days of antibiotics, so she was put  on Augmentin.   Abdominal pain epigastric, nausea/vomiting. Symptoms are improved. She is status post fundoplication in the past. She was seen by general surgery who did not feel there was any acute issue. Upper abdominal area on the CT scan did not show anything concerning. Subsequently, she started tolerating her diet without any nausea or vomiting. She was experiencing some upper abdominal fullness, which has been present previously as well. She's been asked to follow-up with her gastroenterologist. She was placed on a PPI. She was given Zofran to take at home as needed. C. difficile were PCR was negative.   Left breast cancer status post recent mastectomy On tamoxifen. She can follow-up with her oncologist as an outpatient. She developed a rash over her mastectomy site which appeared to be a yeast infection according to her oncologist. She has been on Diflucan in the past for the same, so she was prescribed more of the same. She was asked to see her providers if rash gets worse or doesn't improve as expected.  Dysphagia Symptoms improved. Can be pursued as an outpatient.  Essential hypertension Blood pressure remained stable. High blood pressure readings were noted as the cuff was being placed on her legs. When her blood pressure was checked in the left arm the pressure was reasonably well controlled.  Depression/fibromyalgia Continue with Celexa  Overall, patient is improved. She is keen on going home. She was discharged in stable condition.  PERTINENT LABS:  The results of significant diagnostics from this hospitalization (including imaging, microbiology, ancillary and laboratory) are listed below for reference.    Microbiology: Recent Results (from the past 240 hour(s))  Urine culture     Status: None  Collection Time: 08/28/14  2:38 PM  Result Value Ref Range Status   Specimen Description URINE, CLEAN CATCH  Final   Special Requests NONE  Final   Colony Count NO  GROWTH Performed at [facility]   Final   Culture NO GROWTH Performed at [facility]   Final   Report Status 08/29/2014 FINAL  Final  Blood culture (routine x 2)     Status: None (Preliminary result)   Collection Time: 08/28/14  2:52 PM  Result Value Ref Range Status   Specimen Description BLOOD RIGHT FOREARM  Final   Special Requests Normal  Final   Culture   Final           BLOOD CULTURE RECEIVED NO GROWTH TO DATE CULTURE WILL BE HELD FOR 5 DAYS BEFORE ISSUING A FINAL NEGATIVE REPORT Performed at [facility]    Report Status PENDING  Incomplete  Blood culture (routine x 2)     Status: None (Preliminary result)   Collection Time: 08/28/14  3:00 PM  Result Value Ref Range Status   Specimen Description BLOOD RIGHT ANTECUBITAL  Final   Special Requests Normal  Final   Culture   Final           BLOOD CULTURE RECEIVED NO GROWTH TO DATE CULTURE WILL BE HELD FOR 5 DAYS BEFORE ISSUING A FINAL NEGATIVE REPORT Performed at [facility]    Report Status PENDING  Incomplete  Culture, sputum-assessment     Status: None   Collection Time: 08/29/14  3:25 AM  Result Value Ref Range Status   Specimen Description SPUTUM  Final   Special Requests NONE  Final   Sputum evaluation   Final    MICROSCOPIC FINDINGS SUGGEST THAT THIS SPECIMEN IS NOT REPRESENTATIVE OF LOWER RESPIRATORY SECRETIONS. PLEASE RECOLLECT. CALLED WAADE/3W _0  ON 08/29/14 BY KARCZEWSKI,S.    Report Status 08/29/2014 FINAL  Final  Clostridium Difficile by PCR     Status: None   Collection Time: 08/30/14  8:30 AM  Result Value Ref Range Status   C difficile by pcr NEGATIVE NEGATIVE Final    Comment: Performed at [facility]  Wound culture     Status: None   Collection Time: 08/30/14  9:26 AM  Result Value Ref Range Status   Specimen Description JP DRAINAGE  Final   Special Requests Immunocompromised  Final   Gram Stain   Final    FEW WBC PRESENT, PREDOMINANTLY PMN NO SQUAMOUS  EPITHELIAL CELLS SEEN FEW GRAM POSITIVE RODS Performed at [facility]    Culture   Final    MULTIPLE ORGANISMS PRESENT, NONE PREDOMINANT Note: NO STAPHYLOCOCCUS AUREUS ISOLATED NO GROUP A STREP (S.PYOGENES) ISOLATED Performed at [facility]    Report Status 09/01/2014 FINAL  Final     Labs: Basic Metabolic Panel:  Recent Labs Lab 08/28/14 1500 08/29/14 0458 08/30/14 0415 08/31/14 0410 09/01/14 0407  NA 138 138 140 144 143  K 3.5 3.3* 3.8 3.8 3.7  CL 102 109 110 113* 113*  CO2 _1 GLUCOSE 134* 126* 117* 100* 100*  BUN _2 CREATININE 0.86 0.78 0.90 0.86 0.82  CALCIUM 8.7 7.6* 7.9* 8.1* 7.9*   Liver Function Tests:  Recent Labs Lab 08/29/14 0458  AST 18  ALT 16  ALKPHOS 40  BILITOT 0.8  PROT 4.9*  ALBUMIN 2.7*   CBC:  Recent Labs Lab 08/28/14 1500 08/29/14 0458 08/30/14 0415 08/31/14  0410 09/01/14 0407  WBC 16.2* 13.2* 6.2 5.0 5.4  NEUTROABS 14.1*  --   --   --   --   HGB 14.4 11.2* 10.9* 11.0* 10.7*  HCT 42.7 33.5* 32.9* 33.5* 32.4*  MCV 92.6 93.6 94.5 94.1 93.9  PLT 140* 113* 96* 115* 118*   Cardiac Enzymes:  Recent Labs Lab 08/28/14 2300  TROPONINI <0.03    IMAGING STUDIES Ct Chest W Contrast  08/29/2014   CLINICAL DATA:  Left breast cancer with recent mastectomy. Fever. Nausea and vomiting, query abscess. Drain in place.  EXAM: CT CHEST WITH CONTRAST  TECHNIQUE: Multidetector CT imaging of the chest was performed during intravenous contrast administration.  CONTRAST:  66m OMNIPAQUE IOHEXOL 300 MG/ML  SOLN  COMPARISON:  03/15/2014  FINDINGS: Mediastinum/Nodes: No pathologic thoracic adenopathy. Bilateral axillary clips.  Lungs/Pleura: Trace bilateral pleural effusions, left greater than right. Mild biapical pleural parenchymal scarring, right greater than left.  Left upper lobe subpleural lymph node on image 20 of series 5, 0.5 by 0.3 cm, not changed 11/25/2007 and considered benign. 4 mm left lower lobe  nodule, image 38 series 5, no change from 11/25/2007. Additional slight left basilar nodularity likewise stable.  Upper abdomen: Diffuse steatosis of the visualized liver. 5 mm right kidney upper pole nonobstructive calculus. Postoperative findings along the gastroesophageal junction.  Musculoskeletal: Thoracic spondylosis.  Left breast expander between the pectoralis muscles. A drain extends along the medial margin of this expander and just superficial to the medial margin of the pectoralis musculature. No abscess identified.  IMPRESSION: 1. No abscess in the vicinity of the drain or tissue expander. 2. Trace bilateral pleural effusions, left greater than right. 3. Small left-sided pulmonary nodules are stable from 2009 and considered benign. 4. Diffuse hepatic steatosis. 5. 5 mm right kidney upper pole nonobstructive calculus.   Electronically Signed   By: WVan ClinesM.D.   On: 08/29/2014 12:58   Dg Chest Port 1 View  (if Code Sepsis Called)  08/28/2014   CLINICAL DATA:  Nausea.  Vomiting.  Fever.  EXAM: PORTABLE CHEST - 1 VIEW  COMPARISON:  07/20/2014  FINDINGS: Surgical clips which project over the lower right chest and upper abdomen. Soft tissue expander projects over the left chest wall. Surgical clips about the left axilla and lateral chest wall.  Midline trachea. Normal heart size. No pleural effusion or pneumothorax. Clear lungs.  IMPRESSION: No acute cardiopulmonary disease.   Electronically Signed   By: KAbigail MiyamotoM.D.   On: 08/28/2014 15:34    DISCHARGE EXAMINATION: Filed Vitals:   08/31/14 1401 08/31/14 2206 09/01/14 0556 09/01/14 0935  BP: 174/63 180/64 180/69 144/69  Pulse: 69 76 78 75  Temp: 98.3 F (36.8 C) 98.7 F (37.1 C) 98 F (36.7 C)   TempSrc: Oral Oral Oral   Resp: _0 Height:      Weight:      SpO2: 100% 100% 98%    General appearance: alert, cooperative, appears stated age and no distress Resp: clear to auscultation bilaterally Cardio: regular rate and  rhythm, S1, S2 normal, no murmur, click, rub or gallop GI: soft, non-tender; bowel sounds normal; no masses,  no organomegaly  DISPOSITION: Home  Discharge Instructions    Call MD for:  extreme fatigue    Complete by:  As directed      Call MD for:  persistant dizziness or light-headedness    Complete by:  As directed      Call MD for:  persistant nausea and vomiting    Complete by:  As directed      Call MD for:  severe uncontrolled pain    Complete by:  As directed      Call MD for:  temperature >100.4    Complete by:  As directed      Diet - low sodium heart healthy    Complete by:  As directed      Discharge instructions    Complete by:  As directed   Please follow up with Dr. Carlean Purl within 1-2 weeks regarding your abdominal discomfort. Monitor the rash closely and if getting worse please call Dr. Harlow Mares or Dr. [name].     Increase activity slowly    Complete by:  As directed            ALLERGIES:  Allergies  Allergen Reactions  . Ace Inhibitors     REACTION: angioedema  . Morphine And Related     "crazy"  . Codeine Rash  . Sulfonamide Derivatives Rash  . Telmisartan Rash     Current Discharge Medication List    START taking these medications   Details  amoxicillin-clavulanate (AUGMENTIN) 875-125 MG per tablet Take 1 tablet by mouth every 12 (twelve) hours. For 5 days Qty: 10 tablet, Refills: 0    omeprazole (PRILOSEC) 20 MG capsule Take 1 capsule (20 mg total) by mouth 2 (two) times daily before a meal. Qty: 60 capsule, Refills: 0    ondansetron (ZOFRAN-ODT) 4 MG disintegrating tablet Take 1 tablet (4 mg total) by mouth every 8 (eight) hours as needed for nausea or vomiting. Qty: 20 tablet, Refills: 0      CONTINUE these medications which have CHANGED   Details  fluconazole (DIFLUCAN) 100 MG tablet Take 1 tablet (100 mg total) by mouth daily. Qty: 10 tablet, Refills: 0   Associated Diagnoses: Essential hypertension; Dysphagia; Breast cancer, left breast        CONTINUE these medications which have NOT CHANGED   Details  Cholecalciferol (VITAMIN D3) 1000 UNITS CAPS Take 1 capsule by mouth daily.      citalopram (CELEXA) 10 MG tablet Take 1 tablet (10 mg total) by mouth daily. Qty: 30 tablet, Refills: 1    Cyanocobalamin (VITAMIN B-12 CR) 1000 MCG TBCR Take 1 tablet by mouth daily.      furosemide (LASIX) 40 MG tablet Take 1 tablet (40 mg total) by mouth daily. Qty: 90 tablet, Refills: 0    OVER THE COUNTER MEDICATION Probiotic 90 billion-Take 1 tablet by mouth daily.    tamoxifen (NOLVADEX) 20 MG tablet Take 1 tablet (20 mg total) by mouth daily. Qty: 90 tablet, Refills: 4       Follow-up Information    Follow up with Chauncey Cruel, MD.   Specialty:  Oncology   Why:  as instructed   Contact information:   [place] [address] [phone]       Follow up with Silvano Rusk, MD. Schedule an appointment as soon as possible for a visit in 2 weeks.   Specialty:  Gastroenterology   Why:  for problems with swallowing and upper abdominal fullness   Contact information:   [address] [place] [address] [phone]       Follow up with Merrie Roof, MD In 2 weeks.   Specialty:  General Surgery   Why:  post hospitalization follow up   Contact information:   [address] TIME: 35 mins  [facility] Pager [phone]  09/01/2014, 9:52 AM

## 2014-09-01 NOTE — Progress Notes (Signed)
[name]   DOB:[birth year]   NM#:[id number]   SRP#:[id number] [name] is feeling much better. She is concerned about where they should be taking her BP-- the answer is the Right arm; or they can use a wrist BP reader. She would like to obtain a R compression sleeve. Otherwise she tells me she has a rash over her L breast now. Otherwise feels ready to go home   Objective: middle aged White woman examined in bed Filed Vitals:   09/01/14 0556  BP: 180/69  Pulse: 78  Temp: 98 F (36.7 C)  Resp: 18    Body mass index is 28.97 kg/(m^2).  Intake/Output Summary (Last 24 hours) at 09/01/14 0817 Last data filed at 09/01/14 0743  Gross per 24 hour  Intake    600 ml  Output    100 ml  Net    500 ml    .there is a palpable erythematous scattered rash over the left breast and chest wall area c/w candidal eruption  CBG (last 3)  No results for input(s): GLUCAP in the last 72 hours.   Labs:  Lab Results  Component Value Date   WBC 5.4 09/01/2014   HGB 10.7* 09/01/2014   HCT 32.4* 09/01/2014   MCV 93.9 09/01/2014   PLT 118* 09/01/2014   NEUTROABS 14.1* 08/28/2014    @LASTCHEMISTRY @  Urine Studies No results for input(s): UHGB, CRYS in the last 72 hours.  Invalid input(s): UACOL, UAPR, USPG, UPH, UTP, UGL, UKET, UBIL, UNIT, UROB, [place], UEPI, UWBC, [name], [name]  Basic Metabolic Panel:  Recent Labs Lab 08/28/14 1500 08/29/14 0458 08/30/14 0415 08/31/14 0410 09/01/14 0407  NA 138 138 140 144 143  K 3.5 3.3* 3.8 3.8 3.7  CL 102 109 110 113* 113*  CO2 26 24 26 26 22   GLUCOSE 134* 126* 117* 100* 100*  BUN 14 11 6 6 7   CREATININE 0.86 0.78 0.90 0.86 0.82  CALCIUM 8.7 7.6* 7.9* 8.1* 7.9*   GFR Estimated Creatinine Clearance: 73.1 mL/min (by C-G formula based on Cr of 0.82). Liver Function Tests:  Recent Labs Lab 08/29/14 0458  AST 18  ALT 16  ALKPHOS 40  BILITOT 0.8  PROT 4.9*  ALBUMIN 2.7*   No results for input(s): LIPASE, AMYLASE in the last 168  hours. No results for input(s): AMMONIA in the last 168 hours. Coagulation profile  Recent Labs Lab 08/28/14 2300  INR 1.08    CBC:  Recent Labs Lab 08/28/14 1500 08/29/14 0458 08/30/14 0415 08/31/14 0410 09/01/14 0407  WBC 16.2* 13.2* 6.2 5.0 5.4  NEUTROABS 14.1*  --   --   --   --   HGB 14.4 11.2* 10.9* 11.0* 10.7*  HCT 42.7 33.5* 32.9* 33.5* 32.4*  MCV 92.6 93.6 94.5 94.1 93.9  PLT 140* 113* 96* 115* 118*   Cardiac Enzymes:  Recent Labs Lab 08/28/14 2300  TROPONINI <0.03   BNP: Invalid input(s): POCBNP CBG: No results for input(s): GLUCAP in the last 168 hours. D-Dimer No results for input(s): DDIMER in the last 72 hours. Hgb A1c No results for input(s): HGBA1C in the last 72 hours. Lipid Profile No results for input(s): CHOL, HDL, LDLCALC, TRIG, CHOLHDL, LDLDIRECT in the last 72 hours. Thyroid function studies No results for input(s): TSH, T4TOTAL, T3FREE, THYROIDAB in the last 72 hours.  Invalid input(s): FREET3 Anemia work up No results for input(s): VITAMINB12, FOLATE, FERRITIN, TIBC, IRON, RETICCTPCT in the last 72 hours. Microbiology Recent Results (from the past 240  hour(s))  Urine culture     Status: None   Collection Time: 08/28/14  2:38 PM  Result Value Ref Range Status   Specimen Description URINE, CLEAN CATCH  Final   Special Requests NONE  Final   Colony Count NO GROWTH Performed at [facility]   Final   Culture NO GROWTH Performed at [facility]   Final   Report Status 08/29/2014 FINAL  Final  Blood culture (routine x 2)     Status: None (Preliminary result)   Collection Time: 08/28/14  2:52 PM  Result Value Ref Range Status   Specimen Description BLOOD RIGHT FOREARM  Final   Special Requests Normal  Final   Culture   Final           BLOOD CULTURE RECEIVED NO GROWTH TO DATE CULTURE WILL BE HELD FOR 5 DAYS BEFORE ISSUING A FINAL NEGATIVE REPORT Performed at [facility]    Report Status PENDING   Incomplete  Blood culture (routine x 2)     Status: None (Preliminary result)   Collection Time: 08/28/14  3:00 PM  Result Value Ref Range Status   Specimen Description BLOOD RIGHT ANTECUBITAL  Final   Special Requests Normal  Final   Culture   Final           BLOOD CULTURE RECEIVED NO GROWTH TO DATE CULTURE WILL BE HELD FOR 5 DAYS BEFORE ISSUING A FINAL NEGATIVE REPORT Performed at [facility]    Report Status PENDING  Incomplete  Culture, sputum-assessment     Status: None   Collection Time: 08/29/14  3:25 AM  Result Value Ref Range Status   Specimen Description SPUTUM  Final   Special Requests NONE  Final   Sputum evaluation   Final    MICROSCOPIC FINDINGS SUGGEST THAT THIS SPECIMEN IS NOT REPRESENTATIVE OF LOWER RESPIRATORY SECRETIONS. PLEASE RECOLLECT. CALLED WA[facility]/3W @0420  ON 08/29/14 BY KARCZEWSKI,S.    Report Status 08/29/2014 FINAL  Final  Clostridium Difficile by PCR     Status: None   Collection Time: 08/30/14  8:30 AM  Result Value Ref Range Status   C difficile by pcr NEGATIVE NEGATIVE Final    Comment: Performed at [facility]  Wound culture     Status: None   Collection Time: 08/30/14  9:26 AM  Result Value Ref Range Status   Specimen Description JP DRAINAGE  Final   Special Requests Immunocompromised  Final   Gram Stain   Final    FEW WBC PRESENT, PREDOMINANTLY PMN NO SQUAMOUS EPITHELIAL CELLS SEEN FEW GRAM POSITIVE RODS Performed at [facility]    Culture   Final    MULTIPLE ORGANISMS PRESENT, NONE PREDOMINANT Note: NO STAPHYLOCOCCUS AUREUS ISOLATED NO GROUP A STREP (S.PYOGENES) ISOLATED Performed at [facility]    Report Status 09/01/2014 FINAL  Final      Studies:  No results found.  Assessment: 69 y.o. [place] woman status post left breast biopsy 05/09/2014 for a clinical T1-T2, N0 (stage I-II) invasive lobular breast cancer, estrogen receptor 100% positive, progesterone receptor 99% positive, with an MIB-1 of  8%, and no HER-2 amplification.  (1) history of right modified radical mastectomy with TRAM reconstruction 1996, followed by chemotherapy for 6 months; did not receive antiestrogen therapy or radiation therapy adjuvantly  (2) tamoxifen started neoadjuvantly 05/18/2014  (3) status post left mastectomy and sentinel lymph node sampling 07/28/2014 for a pT1c pN0(i+). Stage IA invasive ductal carcinoma, grade 1, with repeat  HER-2 again negative. Margins were ample  (4) Oncotype score of 3 predicts an outside the breast risk of recurrence within 10 years of 4% if the patient's only systemic treatment is tamoxifen for 5 years. It also predicts no significant benefit from chemotherapy.  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 Sequencing and rearrangement analysis for the following 32 genes found no deleterious mutations: APC, ATM, BARD1, BMPR1A, BRCA1, BRCA2, BRIP1, CDH1, CDK4, CDKN2A, CHEK2, EPCAM, GREM1, MLH1, MRE11A, MSH2, MSH6, MUTYH, NBN, NF1, PALB2, PMS2, POLD1, POLE, PTEN, RAD50, RAD51D, SMAD4, SMARCA4, STK11, and TP53. (a) Heterozygous MUTYH mutations are not associated with an increased risk for cancer in the patient, however, other family members may be at risk for homozygous mutations and therefore family testing should be considered.      Plan: Beyla is overall feeling much better and likely can go home today. She has developed a rash over her left breast which I believe is candidal. A similar rash recently responded to diflucan-- accordingly I am starting her on this drug and she should continue on it until the reash is completely resolve. If it does not respond she will call our office next week or bring it up with Dr Harlow Mares when she sees him next.  Greatly appreciate your help to this patient!   Chauncey Cruel,  MD 09/01/2014  8:17 AM Medical Oncology and Hematology [facility] [address] [place], [place] [address] Tel. [phone]    Fax. [phone]

## 2014-09-02 ENCOUNTER — Telehealth: Payer: Self-pay | Admitting: *Deleted

## 2014-09-02 DIAGNOSIS — I10 Essential (primary) hypertension: Secondary | ICD-10-CM

## 2014-09-02 DIAGNOSIS — R1310 Dysphagia, unspecified: Secondary | ICD-10-CM

## 2014-09-02 DIAGNOSIS — C50912 Malignant neoplasm of unspecified site of left female breast: Secondary | ICD-10-CM

## 2014-09-02 NOTE — Addendum Note (Signed)
Addended by: Leticia Penna A on: 09/02/2014 12:06 PM   Modules accepted: Medications

## 2014-09-02 NOTE — Telephone Encounter (Addendum)
Transition Care Management Follow-up Telephone Call  How have you been since you were released from the hospital? Patient    Do you understand why you were in the hospital? YES- "Last Sunday, I didn't feel well and my body was aching.  At about 2:00 I got so severe I couldn't stand it and I told my husband I had to go.  They brought me to [place] and they took me right in and they started treating me for bloodstream infection."     Do you understand the discharge instrcutions? YES- I'm supposed to contact my gastrologist because my stomach aches, it feels like it's raw inside and it is not indigestion.    Items Reviewed:  Medications reviewed: YES  Allergies reviewed: YES   Dietary changes reviewed: NO, no changes  Referrals reviewed: YES   Functional Questionnaire:   Activities of Daily Living (ADLs):   She states they are independent in the following: "I can put my clothes on and sit in a chair.  My husband has been bringing me meals."  Her son and husband are at home with her  States they require assistance with the following: Food, transportation    Any transportation issues/concerns?: NO- husband or son will drive   Any patient concerns? She feels like she has caught a cold and still has no energy.      Confirmed importance and date/time of follow-up visits scheduled: YES- follow-up scheduled with Elyn Aquas, PA

## 2014-09-02 NOTE — Telephone Encounter (Signed)
Unable to reach patient at time of TCM Call. Left message for patient to return call when available.  

## 2014-09-03 LAB — CULTURE, BLOOD (ROUTINE X 2)
CULTURE: NO GROWTH
Culture: NO GROWTH
SPECIAL REQUESTS: NORMAL
SPECIAL REQUESTS: NORMAL

## 2014-09-05 ENCOUNTER — Encounter: Payer: Self-pay | Admitting: Physician Assistant

## 2014-09-05 ENCOUNTER — Ambulatory Visit (INDEPENDENT_AMBULATORY_CARE_PROVIDER_SITE_OTHER): Payer: Medicare HMO | Admitting: Physician Assistant

## 2014-09-05 ENCOUNTER — Other Ambulatory Visit (HOSPITAL_COMMUNITY): Payer: Self-pay | Admitting: Plastic Surgery

## 2014-09-05 VITALS — BP 126/73 | HR 74 | Temp 98.2°F | Resp 16 | Ht 67.0 in | Wt 183.2 lb

## 2014-09-05 DIAGNOSIS — J9801 Acute bronchospasm: Secondary | ICD-10-CM

## 2014-09-05 DIAGNOSIS — B9789 Other viral agents as the cause of diseases classified elsewhere: Principal | ICD-10-CM

## 2014-09-05 DIAGNOSIS — J069 Acute upper respiratory infection, unspecified: Secondary | ICD-10-CM

## 2014-09-05 DIAGNOSIS — R6510 Systemic inflammatory response syndrome (SIRS) of non-infectious origin without acute organ dysfunction: Secondary | ICD-10-CM

## 2014-09-05 DIAGNOSIS — A419 Sepsis, unspecified organism: Secondary | ICD-10-CM

## 2014-09-05 MED ORDER — ALBUTEROL SULFATE HFA 108 (90 BASE) MCG/ACT IN AERS: 2.0000 | INHALATION_SPRAY | Freq: Four times a day (QID) | RESPIRATORY_TRACT | Status: DC | PRN

## 2014-09-05 NOTE — Progress Notes (Signed)
Patient presents to clinic today for hospital follow-up of Sepsis following mastectomy. Patient treated with IV antibiotics to be stabilized before being placed on oral antibiotic. Patient stabilized before discharge. Blood cultures negative.  Patient states she has been doing well overall.  Denies fever, chills, malaise.  Is still having drainage from surgical site -- has wound drain in place.  Has upcoming surgery to reassess why drainage is still present.  Patient does endorse mild dry cough x 2 days. Denies chest congestion or SOB.  Past Medical History  Diagnosis Date  . GERD (gastroesophageal reflux disease)   . Depression   . Vitamin D deficiency   . Vitamin B12 deficiency   . Lichen sclerosus   . Family history of breast cancer   . Family history of colon cancer   . Family history of pancreatic cancer   . Dyspnea     Normal Spirometry 03/2008 EF 65% BNP normal 11/2007  . Blood dyscrasia     bruises and bleed easily  . History of hiatal hernia   . Complication of anesthesia     went to sleep easily but hard to wake up up until elbow OR in 2010  . Arthritis     "spine" (07/28/2014)  . Chronic back pain     "all over"  . Mild anxiety   . Breast cancer, right breast 1995  . Melanoma 2010    "right elbow; treated at [facility]"  . Breast cancer, left breast 07/28/2014    Current Outpatient Prescriptions on File Prior to Visit  Medication Sig Dispense Refill  . Cholecalciferol (VITAMIN D3) 1000 UNITS CAPS Take 1 capsule by mouth daily.      . citalopram (CELEXA) 10 MG tablet Take 1 tablet (10 mg total) by mouth daily. 30 tablet 1  . Cyanocobalamin (VITAMIN B-12 CR) 1000 MCG TBCR Take 1 tablet by mouth daily.      . fluconazole (DIFLUCAN) 100 MG tablet Take 1 tablet (100 mg total) by mouth daily. 10 tablet 0  . furosemide (LASIX) 40 MG tablet Take 1 tablet (40 mg total) by mouth daily. 90 tablet 0  . OVER THE COUNTER MEDICATION Probiotic 90 billion-Take 1 tablet by mouth daily.    .  tamoxifen (NOLVADEX) 20 MG tablet Take 1 tablet (20 mg total) by mouth daily. 90 tablet 4  . [DISCONTINUED] sucralfate (CARAFATE) 1 GM/10ML suspension Take 1 g by mouth at bedtime as needed.       No current facility-administered medications on file prior to visit.    Allergies  Allergen Reactions  . Ace Inhibitors     REACTION: angioedema  . Morphine And Related     "crazy"  . Codeine Rash  . Sulfonamide Derivatives Rash  . Telmisartan Rash    Family History  Problem Relation Age of Onset  . Stroke Other     F 1st degree relative 53, M 1st degree relative  . Colon cancer Cousin 55    double first cousin  . Colon cancer Cousin 13    double first cousin  . Colon polyps Father     between 20-20  . Dementia Father   . Pancreatic cancer Brother 57  . Colon polyps Brother     between 10-20  . Cancer Paternal Uncle     NOS  . Anemia Paternal Grandfather     pernicious anemia  . Breast cancer Cousin 65    double first cousin  . Breast cancer Cousin 40  paternal cousin    History   Social History  . Marital Status: Married    Spouse Name: N/A  . Number of Children: 2  . Years of Education: N/A   Occupational History  . Manager    Social History Main Topics  . Smoking status: Never Smoker   . Smokeless tobacco: Never Used     Comment: Regular Exercise - Yes  . Alcohol Use: No  . Drug Use: No  . Sexual Activity: Yes   Other Topics Concern  . None   Social History Narrative    Review of Systems - See HPI.  All other ROS are negative.  BP 126/73 mmHg  Pulse 74  Temp(Src) 98.2 F (36.8 C) (Oral)  Resp 16  Ht _0  (1.702 m)  Wt 183 lb 4 oz (83.122 kg)  BMI 28.69 kg/m2  SpO2 100%  Physical Exam  Constitutional: She is oriented to person, place, and time and well-developed, well-nourished, and in no distress.  HENT:  Head: Normocephalic and atraumatic.  Right Ear: External ear normal.  Left Ear: External ear normal.  Nose: Nose normal.    Mouth/Throat: Oropharynx is clear and moist. No oropharyngeal exudate.  TM within normal limits bilaterally.  Eyes: Conjunctivae are normal.  Neck: Neck supple.  Cardiovascular: Normal rate, regular rhythm, normal heart sounds and intact distal pulses.   Pulmonary/Chest: Effort normal and breath sounds normal. No respiratory distress. She has no wheezes. She has no rales. She exhibits no tenderness.  Lymphadenopathy:    She has no cervical adenopathy.  Neurological: She is alert and oriented to person, place, and time.  Skin: Skin is warm and dry. No rash noted.  Psychiatric: Affect normal.  Vitals reviewed.   Recent Results (from the past 2160 hour(s))  CBC WITH DIFFERENTIAL     Status: None   Collection Time: 07/20/14  9:20 AM  Result Value Ref Range   WBC 6.2 4.0 - 10.5 K/uL   RBC 4.44 3.87 - 5.11 MIL/uL   Hemoglobin 13.5 12.0 - 15.0 g/dL   HCT 40.6 36.0 - 46.0 %   MCV 91.4 78.0 - 100.0 fL   MCH 30.4 26.0 - 34.0 pg   MCHC 33.3 30.0 - 36.0 g/dL   RDW 12.9 11.5 - 15.5 %   Platelets 180 150 - 400 K/uL   Neutrophils Relative % 64 43 - 77 %   Neutro Abs 4.0 1.7 - 7.7 K/uL   Lymphocytes Relative 26 12 - 46 %   Lymphs Abs 1.6 0.7 - 4.0 K/uL   Monocytes Relative 6 3 - 12 %   Monocytes Absolute 0.4 0.1 - 1.0 K/uL   Eosinophils Relative 3 0 - 5 %   Eosinophils Absolute 0.2 0.0 - 0.7 K/uL   Basophils Relative 1 0 - 1 %   Basophils Absolute 0.0 0.0 - 0.1 K/uL  Comprehensive metabolic panel     Status: Abnormal   Collection Time: 07/20/14  9:20 AM  Result Value Ref Range   Sodium 142 135 - 145 mmol/L   Potassium 3.7 3.5 - 5.1 mmol/L   Chloride 105 96 - 112 mmol/L   CO2 29 19 - 32 mmol/L   Glucose, Bld 92 70 - 99 mg/dL   BUN 14 6 - 23 mg/dL   Creatinine, Ser 0.90 0.50 - 1.10 mg/dL   Calcium 8.8 8.4 - 10.5 mg/dL   Total Protein 6.4 6.0 - 8.3 g/dL   Albumin 3.7 3.5 - 5.2 g/dL   AST  22 0 - 37 U/L   ALT 19 0 - 35 U/L   Alkaline Phosphatase 56 39 - 117 U/L   Total Bilirubin 0.4  0.3 - 1.2 mg/dL   GFR calc non Af Amer 64 (L) >90 mL/min   GFR calc Af Amer 74 (L) >90 mL/min    Comment: (NOTE) The eGFR has been calculated using the CKD EPI equation. This calculation has not been validated in all clinical situations. eGFR's persistently <90 mL/min signify possible Chronic Kidney Disease.    Anion gap 8 5 - 15  Urinalysis, Routine w reflex microscopic     Status: Abnormal   Collection Time: 08/28/14  2:38 PM  Result Value Ref Range   Color, Urine YELLOW YELLOW   APPearance CLEAR CLEAR   Specific Gravity, Urine 1.018 1.005 - 1.030   pH 8.0 5.0 - 8.0   Glucose, UA NEGATIVE NEGATIVE mg/dL   Hgb urine dipstick NEGATIVE NEGATIVE   Bilirubin Urine NEGATIVE NEGATIVE   Ketones, ur 15 (A) NEGATIVE mg/dL   Protein, ur NEGATIVE NEGATIVE mg/dL   Urobilinogen, UA 1.0 0.0 - 1.0 mg/dL   Nitrite NEGATIVE NEGATIVE   Leukocytes, UA NEGATIVE NEGATIVE    Comment: MICROSCOPIC NOT DONE ON URINES WITH NEGATIVE PROTEIN, BLOOD, LEUKOCYTES, NITRITE, OR GLUCOSE <1000 mg/dL.  Urine culture     Status: None   Collection Time: 08/28/14  2:38 PM  Result Value Ref Range   Specimen Description URINE, CLEAN CATCH    Special Requests NONE    Colony Count NO GROWTH Performed at [facility]     Culture NO GROWTH Performed at [facility]     Report Status 08/29/2014 FINAL   Blood culture (routine x 2)     Status: None   Collection Time: 08/28/14  2:52 PM  Result Value Ref Range   Specimen Description BLOOD RIGHT FOREARM    Special Requests Normal    Culture      NO GROWTH 5 DAYS Performed at [facility]    Report Status 09/03/2014 FINAL   CBC with Differential     Status: Abnormal   Collection Time: 08/28/14  3:00 PM  Result Value Ref Range   WBC 16.2 (H) 4.0 - 10.5 K/uL   RBC 4.61 3.87 - 5.11 MIL/uL   Hemoglobin 14.4 12.0 - 15.0 g/dL   HCT 42.7 36.0 - 46.0 %   MCV 92.6 78.0 - 100.0 fL   MCH 31.2 26.0 - 34.0 pg   MCHC 33.7 30.0 - 36.0 g/dL   RDW 12.8  11.5 - 15.5 %   Platelets 140 (L) 150 - 400 K/uL   Neutrophils Relative % 86 (H) 43 - 77 %   Neutro Abs 14.1 (H) 1.7 - 7.7 K/uL   Lymphocytes Relative 7 (L) 12 - 46 %   Lymphs Abs 1.1 0.7 - 4.0 K/uL   Monocytes Relative 6 3 - 12 %   Monocytes Absolute 1.0 0.1 - 1.0 K/uL   Eosinophils Relative 1 0 - 5 %   Eosinophils Absolute 0.1 0.0 - 0.7 K/uL   Basophils Relative 0 0 - 1 %   Basophils Absolute 0.0 0.0 - 0.1 K/uL  Basic metabolic panel     Status: Abnormal   Collection Time: 08/28/14  3:00 PM  Result Value Ref Range   Sodium 138 135 - 145 mmol/L   Potassium 3.5 3.5 - 5.1 mmol/L   Chloride 102 96 - 112 mmol/L   CO2 26 19 - 32  mmol/L   Glucose, Bld 134 (H) 70 - 99 mg/dL   BUN 14 6 - 23 mg/dL   Creatinine, Ser 0.86 0.50 - 1.10 mg/dL   Calcium 8.7 8.4 - 10.5 mg/dL   GFR calc non Af Amer 68 (L) >90 mL/min   GFR calc Af Amer 79 (L) >90 mL/min    Comment: (NOTE) The eGFR has been calculated using the CKD EPI equation. This calculation has not been validated in all clinical situations. eGFR's persistently <90 mL/min signify possible Chronic Kidney Disease.    Anion gap 10 5 - 15  Blood culture (routine x 2)     Status: None   Collection Time: 08/28/14  3:00 PM  Result Value Ref Range   Specimen Description BLOOD RIGHT ANTECUBITAL    Special Requests Normal    Culture      NO GROWTH 5 DAYS Performed at [facility]    Report Status 09/03/2014 FINAL   I-Stat CG4 Lactic Acid, ED     Status: None   Collection Time: 08/28/14  3:05 PM  Result Value Ref Range   Lactic Acid, Venous 1.89 0.5 - 2.0 mmol/L  Influenza panel by PCR (type A & B, H1N1)     Status: None   Collection Time: 08/28/14  7:02 PM  Result Value Ref Range   Influenza A By PCR NEGATIVE NEGATIVE   Influenza B By PCR NEGATIVE NEGATIVE   H1N1 flu by pcr NOT DETECTED NOT DETECTED    Comment:        The Xpert Flu assay (FDA approved for nasal aspirates or washes and nasopharyngeal swab specimens),  is intended as an aid in the diagnosis of influenza and should not be used as a sole basis for treatment. Performed at [facility]   I-Stat CG4 Lactic Acid, ED (not at [facility])     Status: None   Collection Time: 08/28/14  7:04 PM  Result Value Ref Range   Lactic Acid, Venous 1.73 0.5 - 2.0 mmol/L  Cortisol     Status: None   Collection Time: 08/28/14 11:00 PM  Result Value Ref Range   Cortisol, Plasma 19.6 ug/dL    Comment: (NOTE) AM:  4.3 - 22.4 ug/dL PM:  3.1 - 16.7 ug/dL Performed at [facility]   Troponin I     Status: None   Collection Time: 08/28/14 11:00 PM  Result Value Ref Range   Troponin I <0.03 <0.031 ng/mL    Comment:        NO INDICATION OF MYOCARDIAL INJURY.   Protime-INR     Status: None   Collection Time: 08/28/14 11:00 PM  Result Value Ref Range   Prothrombin Time 14.1 11.6 - 15.2 seconds   INR 1.08 0.00 - 1.49  APTT     Status: None   Collection Time: 08/28/14 11:00 PM  Result Value Ref Range   aPTT 29 24 - 37 seconds  Fibrinogen     Status: None   Collection Time: 08/28/14 11:00 PM  Result Value Ref Range   Fibrinogen 359 204 - 475 mg/dL  Type and screen     Status: None   Collection Time: 08/28/14 11:00 PM  Result Value Ref Range   ABO/RH(D) A POS    Antibody Screen NEG    Sample Expiration 08/31/2014   ABO/Rh     Status: None   Collection Time: 08/29/14  1:17 AM  Result Value Ref Range   ABO/RH(D) A POS  Culture, sputum-assessment     Status: None   Collection Time: 08/29/14  3:25 AM  Result Value Ref Range   Specimen Description SPUTUM    Special Requests NONE    Sputum evaluation      MICROSCOPIC FINDINGS SUGGEST THAT THIS SPECIMEN IS NOT REPRESENTATIVE OF LOWER RESPIRATORY SECRETIONS. PLEASE RECOLLECT. CALLED [facility]/3W _0  ON 08/29/14 BY KARCZEWSKI,S.    Report Status 08/29/2014 FINAL   Lactic acid, plasma     Status: None   Collection Time: 08/29/14  4:56 AM  Result Value Ref Range   Lactic Acid, Venous 1.1 0.5 -  2.0 mmol/L  CBC     Status: Abnormal   Collection Time: 08/29/14  4:58 AM  Result Value Ref Range   WBC 13.2 (H) 4.0 - 10.5 K/uL   RBC 3.58 (L) 3.87 - 5.11 MIL/uL   Hemoglobin 11.2 (L) 12.0 - 15.0 g/dL   HCT 33.5 (L) 36.0 - 46.0 %   MCV 93.6 78.0 - 100.0 fL   MCH 31.3 26.0 - 34.0 pg   MCHC 33.4 30.0 - 36.0 g/dL   RDW 12.9 11.5 - 15.5 %   Platelets 113 (L) 150 - 400 K/uL    Comment: SPECIMEN CHECKED FOR CLOTS REPEATED TO VERIFY PLATELET COUNT CONFIRMED BY SMEAR   Comprehensive metabolic panel     Status: Abnormal   Collection Time: 08/29/14  4:58 AM  Result Value Ref Range   Sodium 138 135 - 145 mmol/L   Potassium 3.3 (L) 3.5 - 5.1 mmol/L   Chloride 109 96 - 112 mmol/L   CO2 24 19 - 32 mmol/L   Glucose, Bld 126 (H) 70 - 99 mg/dL   BUN 11 6 - 23 mg/dL   Creatinine, Ser 0.78 0.50 - 1.10 mg/dL   Calcium 7.6 (L) 8.4 - 10.5 mg/dL   Total Protein 4.9 (L) 6.0 - 8.3 g/dL   Albumin 2.7 (L) 3.5 - 5.2 g/dL   AST 18 0 - 37 U/L   ALT 16 0 - 35 U/L   Alkaline Phosphatase 40 39 - 117 U/L   Total Bilirubin 0.8 0.3 - 1.2 mg/dL   GFR calc non Af Amer 84 (L) >90 mL/min   GFR calc Af Amer >90 >90 mL/min    Comment: (NOTE) The eGFR has been calculated using the CKD EPI equation. This calculation has not been validated in all clinical situations. eGFR's persistently <90 mL/min signify possible Chronic Kidney Disease.    Anion gap 5 5 - 15  CBC     Status: Abnormal   Collection Time: 08/30/14  4:15 AM  Result Value Ref Range   WBC 6.2 4.0 - 10.5 K/uL   RBC 3.48 (L) 3.87 - 5.11 MIL/uL   Hemoglobin 10.9 (L) 12.0 - 15.0 g/dL   HCT 32.9 (L) 36.0 - 46.0 %   MCV 94.5 78.0 - 100.0 fL   MCH 31.3 26.0 - 34.0 pg   MCHC 33.1 30.0 - 36.0 g/dL   RDW 13.1 11.5 - 15.5 %   Platelets 96 (L) 150 - 400 K/uL    Comment: CONSISTENT WITH PREVIOUS RESULT  Basic metabolic panel     Status: Abnormal   Collection Time: 08/30/14  4:15 AM  Result Value Ref Range   Sodium 140 135 - 145 mmol/L   Potassium 3.8  3.5 - 5.1 mmol/L   Chloride 110 96 - 112 mmol/L   CO2 26 19 - 32 mmol/L   Glucose, Bld 117 (H) 70 - 99 mg/dL  BUN 6 6 - 23 mg/dL   Creatinine, Ser 0.90 0.50 - 1.10 mg/dL   Calcium 7.9 (L) 8.4 - 10.5 mg/dL   GFR calc non Af Amer 64 (L) >90 mL/min   GFR calc Af Amer 74 (L) >90 mL/min    Comment: (NOTE) The eGFR has been calculated using the CKD EPI equation. This calculation has not been validated in all clinical situations. eGFR's persistently <90 mL/min signify possible Chronic Kidney Disease.    Anion gap 4 (L) 5 - 15  Clostridium Difficile by PCR     Status: None   Collection Time: 08/30/14  8:30 AM  Result Value Ref Range   C difficile by pcr NEGATIVE NEGATIVE    Comment: Performed at [facility]  Wound culture     Status: None   Collection Time: 08/30/14  9:26 AM  Result Value Ref Range   Specimen Description JP DRAINAGE    Special Requests Immunocompromised    Gram Stain      FEW WBC PRESENT, PREDOMINANTLY PMN NO SQUAMOUS EPITHELIAL CELLS SEEN FEW GRAM POSITIVE RODS Performed at [facility]    Culture      MULTIPLE ORGANISMS PRESENT, NONE PREDOMINANT Note: NO STAPHYLOCOCCUS AUREUS ISOLATED NO GROUP A STREP (S.PYOGENES) ISOLATED Performed at [facility]    Report Status 09/01/2014 FINAL   Vancomycin, trough     Status: None   Collection Time: 08/30/14  4:28 PM  Result Value Ref Range   Vancomycin Tr 12.4 10.0 - 20.0 ug/mL  CBC     Status: Abnormal   Collection Time: 08/31/14  4:10 AM  Result Value Ref Range   WBC 5.0 4.0 - 10.5 K/uL   RBC 3.56 (L) 3.87 - 5.11 MIL/uL   Hemoglobin 11.0 (L) 12.0 - 15.0 g/dL   HCT 33.5 (L) 36.0 - 46.0 %   MCV 94.1 78.0 - 100.0 fL   MCH 30.9 26.0 - 34.0 pg   MCHC 32.8 30.0 - 36.0 g/dL   RDW 12.9 11.5 - 15.5 %   Platelets 115 (L) 150 - 400 K/uL    Comment: CONSISTENT WITH PREVIOUS RESULT  Basic metabolic panel     Status: Abnormal   Collection Time: 08/31/14  4:10 AM  Result Value Ref Range   Sodium  144 135 - 145 mmol/L   Potassium 3.8 3.5 - 5.1 mmol/L   Chloride 113 (H) 96 - 112 mmol/L   CO2 26 19 - 32 mmol/L   Glucose, Bld 100 (H) 70 - 99 mg/dL   BUN 6 6 - 23 mg/dL   Creatinine, Ser 0.86 0.50 - 1.10 mg/dL   Calcium 8.1 (L) 8.4 - 10.5 mg/dL   GFR calc non Af Amer 68 (L) >90 mL/min   GFR calc Af Amer 79 (L) >90 mL/min    Comment: (NOTE) The eGFR has been calculated using the CKD EPI equation. This calculation has not been validated in all clinical situations. eGFR's persistently <90 mL/min signify possible Chronic Kidney Disease.    Anion gap 5 5 - 15  CBC     Status: Abnormal   Collection Time: 09/01/14  4:07 AM  Result Value Ref Range   WBC 5.4 4.0 - 10.5 K/uL   RBC 3.45 (L) 3.87 - 5.11 MIL/uL   Hemoglobin 10.7 (L) 12.0 - 15.0 g/dL   HCT 32.4 (L) 36.0 - 46.0 %   MCV 93.9 78.0 - 100.0 fL   MCH 31.0 26.0 - 34.0 pg   MCHC 33.0  30.0 - 36.0 g/dL   RDW 13.0 11.5 - 15.5 %   Platelets 118 (L) 150 - 400 K/uL    Comment: SPECIMEN CHECKED FOR CLOTS CONSISTENT WITH PREVIOUS RESULT   Basic metabolic panel     Status: Abnormal   Collection Time: 09/01/14  4:07 AM  Result Value Ref Range   Sodium 143 135 - 145 mmol/L   Potassium 3.7 3.5 - 5.1 mmol/L   Chloride 113 (H) 96 - 112 mmol/L   CO2 22 19 - 32 mmol/L   Glucose, Bld 100 (H) 70 - 99 mg/dL   BUN 7 6 - 23 mg/dL   Creatinine, Ser 0.82 0.50 - 1.10 mg/dL   Calcium 7.9 (L) 8.4 - 10.5 mg/dL   GFR calc non Af Amer 72 (L) >90 mL/min   GFR calc Af Amer 83 (L) >90 mL/min    Comment: (NOTE) The eGFR has been calculated using the CKD EPI equation. This calculation has not been validated in all clinical situations. eGFR's persistently <90 mL/min signify possible Chronic Kidney Disease.    Anion gap 8 5 - 15    Assessment/Plan: Viral URI with cough Supportive measures discussed. Encouraged use of OTC cough medications and increased fluids to help calm cough. Humidifier in bedroom.  Follow-up if symptoms are not  improving.   SIRS (systemic inflammatory response syndrome) Cultures negative.  Patient has completed oral antibiotic. Afebrile.  Doing well overall.  Encouraged patient to stay hydrated and eat a well-balanced diet.  Alarm signs/symptoms discussed with patient.

## 2014-09-05 NOTE — Progress Notes (Signed)
Pre visit review using our clinic review tool, if applicable. No additional management support is needed unless otherwise documented below in the visit note/SLS  

## 2014-09-05 NOTE — Patient Instructions (Signed)
Please follow-up with the Surgeon as scheduled. For the cough, please stay well hydrated and continue Mucinex.  Use the albuterol inhaler as directed for bronchospasm and wheeze. Call or return if symptoms are not improving.  Metered Dose Inhaler (No Spacer Used) Inhaled medicines are the basis of treatment for asthma and other breathing problems. Inhaled medicine can only be effective if used properly. Good technique assures that the medicine reaches the lungs. Metered dose inhalers (MDIs) are used to deliver a variety of inhaled medicines. These include quick relief or rescue medicines (such as bronchodilators) and controller medicines (such as corticosteroids). The medicine is delivered by pushing down on a metal canister to release a set amount of spray. If you are using different kinds of inhalers, use your quick relief medicine to open the airways 10-15 minutes before using a steroid, if instructed to do so by your health care provider. If you are unsure which inhalers to use and the order of using them, ask your health care provider, nurse, or respiratory therapist. HOW TO USE THE INHALER 1. Remove the cap from the inhaler. 2. If you are using the inhaler for the first time, you will need to prime it. Shake the inhaler for 5 seconds and release four puffs into the air, away from your face. Ask your health care provider or pharmacist if you have questions about priming your inhaler. 3. Shake the inhaler for 5 seconds before each breath in (inhalation). 4. Position the inhaler so that the top of the canister faces up. 5. Put your index finger on the top of the medicine canister. Your thumb supports the bottom of the inhaler. 6. Open your mouth. 7. Either place the inhaler between your teeth and place your lips tightly around the mouthpiece, or hold the inhaler 1-2 inches away from your open mouth. If you are unsure of which technique to use, ask your health care provider. 8. Breathe out  (exhale) normally and as completely as possible. 9. Press the canister down with the index finger to release the medicine. 10. At the same time as the canister is pressed, inhale deeply and slowly until your lungs are completely filled. This should take 4-6 seconds. Keep your tongue down. 11. Hold the medicine in your lungs for 5-10 seconds (10 seconds is best). This helps the medicine get into the small airways of your lungs. 12. Breathe out slowly, through pursed lips. Whistling is an example of pursed lips. 13. Wait at least 1 minute between puffs. Continue with the above steps until you have taken the number of puffs your health care provider has ordered. Do not use the inhaler more than your health care provider directs you to. 14. Replace the cap on the inhaler. 15. Follow the directions from your health care provider or the inhaler insert for cleaning the inhaler. If you are using a steroid inhaler, after your last puff, rinse your mouth with water, gargle, and spit out the water. Do not swallow the water. AVOID:  Inhaling before or after starting the spray of medicine. It takes practice to coordinate your breathing with triggering the spray.  Inhaling through the nose (rather than the mouth) when triggering the spray. HOW TO DETERMINE IF YOUR INHALER IS FULL OR NEARLY EMPTY You cannot know when an inhaler is empty by shaking it. Some inhalers are now being made with dose counters. Ask your health care provider for a prescription that has a dose counter if you feel you need that extra help.  If your inhaler does not have a counter, ask your health care provider to help you determine the date you need to refill your inhaler. Write the refill date on a calendar or your inhaler canister. Refill your inhaler 7-10 days before it runs out. Be sure to keep an adequate supply of medicine. This includes making sure it has not expired, and making sure you have a spare inhaler. SEEK MEDICAL CARE  IF:  Symptoms are only partially relieved with your inhaler.  You are having trouble using your inhaler.  You experience an increase in phlegm. SEEK IMMEDIATE MEDICAL CARE IF:  You feel little or no relief with your inhalers. You are still wheezing and feeling shortness of breath, tightness in your chest, or both.  You have dizziness, headaches, or a fast heart rate.  You have chills, fever, or night sweats.  There is a noticeable increase in phlegm production, or there is blood in the phlegm. MAKE SURE YOU:  Understand these instructions.  Will watch your condition.  Will get help right away if you are not doing well or get worse. Document Released: 03/10/2007 Document Revised: 09/27/2013 Document Reviewed: 10/29/2012 [facility] Patient Information 2015 [place], [place]. This information is not intended to replace advice given to you by your health care provider. Make sure you discuss any questions you have with your health care provider.

## 2014-09-07 ENCOUNTER — Encounter (HOSPITAL_COMMUNITY): Payer: Self-pay | Admitting: *Deleted

## 2014-09-07 MED ORDER — HEPARIN SODIUM (PORCINE) 5000 UNIT/ML IJ SOLN
5000.0000 [IU] | Freq: Once | INTRAMUSCULAR | Status: AC
  Administered 2014-09-08: 5000 [IU] via SUBCUTANEOUS
  Filled 2014-09-07: qty 1

## 2014-09-07 MED ORDER — VANCOMYCIN HCL IN DEXTROSE 1-5 GM/200ML-% IV SOLN
1000.0000 mg | INTRAVENOUS | Status: AC
  Administered 2014-09-08: 1000 mg via INTRAVENOUS
  Filled 2014-09-07: qty 200

## 2014-09-07 NOTE — Progress Notes (Signed)
Patient called with time. To arrive at 1130. Spoke with patient

## 2014-09-07 NOTE — Progress Notes (Signed)
Pt denies SOB, chest pain, and being under the care of a cardiologist. Pt stated that she had both a stress test and echo over 10 years ago that was " okay." Pt made aware to stop taking Aspirin, otc vitamins and herbal medications. Do not take any NSAIDs ie: Ibuprofen, Advil, Naproxen or any medication containing Aspirin. Pt verbalized understanding of all pre-op instructions.

## 2014-09-08 ENCOUNTER — Inpatient Hospital Stay (HOSPITAL_COMMUNITY): Payer: Medicare HMO | Admitting: Anesthesiology

## 2014-09-08 ENCOUNTER — Encounter (HOSPITAL_COMMUNITY): Payer: Self-pay | Admitting: Anesthesiology

## 2014-09-08 ENCOUNTER — Inpatient Hospital Stay (HOSPITAL_COMMUNITY)
Admission: RE | Admit: 2014-09-08 | Discharge: 2014-09-10 | DRG: 583 | Disposition: A | Discharge status: Home / Self Care | Payer: Medicare HMO | Source: Ambulatory Visit | Attending: Plastic Surgery | Admitting: Plastic Surgery

## 2014-09-08 ENCOUNTER — Encounter (HOSPITAL_COMMUNITY)
Admission: RE | Disposition: A | Discharge status: Home / Self Care | Payer: Self-pay | Source: Ambulatory Visit | Attending: Plastic Surgery

## 2014-09-08 DIAGNOSIS — I10 Essential (primary) hypertension: Secondary | ICD-10-CM | POA: Diagnosis present

## 2014-09-08 DIAGNOSIS — Z853 Personal history of malignant neoplasm of breast: Secondary | ICD-10-CM

## 2014-09-08 DIAGNOSIS — C50919 Malignant neoplasm of unspecified site of unspecified female breast: Secondary | ICD-10-CM | POA: Diagnosis present

## 2014-09-08 DIAGNOSIS — M797 Fibromyalgia: Secondary | ICD-10-CM | POA: Diagnosis present

## 2014-09-08 DIAGNOSIS — R21 Rash and other nonspecific skin eruption: Secondary | ICD-10-CM | POA: Diagnosis present

## 2014-09-08 DIAGNOSIS — C50912 Malignant neoplasm of unspecified site of left female breast: Secondary | ICD-10-CM | POA: Diagnosis present

## 2014-09-08 DIAGNOSIS — K219 Gastro-esophageal reflux disease without esophagitis: Secondary | ICD-10-CM | POA: Diagnosis present

## 2014-09-08 HISTORY — PX: BREAST RECONSTRUCTION WITH PLACEMENT OF TISSUE EXPANDER AND FLEX HD (ACELLULAR HYDRATED DERMIS): SHX6295

## 2014-09-08 HISTORY — PX: LATISSIMUS FLAP TO BREAST: SHX5357

## 2014-09-08 SURGERY — BREAST RECONSTRUCTION WITH PLACEMENT OF TISSUE EXPANDER AND FLEX HD (ACELLULAR HYDRATED DERMIS)
Anesthesia: General | Site: Breast | Laterality: Left

## 2014-09-08 MED ORDER — ROCURONIUM BROMIDE 100 MG/10ML IV SOLN
INTRAVENOUS | Status: DC | PRN
  Administered 2014-09-08: 20 mg via INTRAVENOUS
  Administered 2014-09-08 (×2): 10 mg via INTRAVENOUS
  Administered 2014-09-08: 40 mg via INTRAVENOUS
  Administered 2014-09-08: 10 mg via INTRAVENOUS
  Administered 2014-09-08: 20 mg via INTRAVENOUS

## 2014-09-08 MED ORDER — NEOSTIGMINE METHYLSULFATE 10 MG/10ML IV SOLN
INTRAVENOUS | Status: DC | PRN
  Administered 2014-09-08: 4 mg via INTRAVENOUS

## 2014-09-08 MED ORDER — DEXAMETHASONE SODIUM PHOSPHATE 4 MG/ML IJ SOLN
INTRAMUSCULAR | Status: DC | PRN
  Administered 2014-09-08: 8 mg via INTRAVENOUS

## 2014-09-08 MED ORDER — PROPOFOL 10 MG/ML IV BOLUS
INTRAVENOUS | Status: AC
  Filled 2014-09-08: qty 20

## 2014-09-08 MED ORDER — ARTIFICIAL TEARS OP OINT
TOPICAL_OINTMENT | OPHTHALMIC | Status: AC
  Filled 2014-09-08: qty 3.5

## 2014-09-08 MED ORDER — ALBUTEROL SULFATE HFA 108 (90 BASE) MCG/ACT IN AERS: 2.0000 | INHALATION_SPRAY | Freq: Four times a day (QID) | RESPIRATORY_TRACT | Status: DC | PRN

## 2014-09-08 MED ORDER — DEXTROSE-NACL 5-0.45 % IV SOLN
INTRAVENOUS | Status: DC
  Administered 2014-09-08: 125 mL/h via INTRAVENOUS
  Administered 2014-09-09: 06:00:00 via INTRAVENOUS

## 2014-09-08 MED ORDER — PHENYLEPHRINE HCL 10 MG/ML IJ SOLN
INTRAMUSCULAR | Status: DC | PRN
  Administered 2014-09-08: 40 ug via INTRAVENOUS
  Administered 2014-09-08: 120 ug via INTRAVENOUS
  Administered 2014-09-08 (×4): 80 ug via INTRAVENOUS

## 2014-09-08 MED ORDER — HYDROMORPHONE HCL 1 MG/ML IJ SOLN
0.5000 mg | INTRAMUSCULAR | Status: DC | PRN
  Administered 2014-09-08: 0.5 mg via INTRAVENOUS

## 2014-09-08 MED ORDER — SODIUM CHLORIDE 0.9 % IV SOLN
INTRAVENOUS | Status: AC
  Administered 2014-09-08: 1000 mL
  Filled 2014-09-08: qty 1

## 2014-09-08 MED ORDER — GLYCOPYRROLATE 0.2 MG/ML IJ SOLN
INTRAMUSCULAR | Status: DC | PRN
  Administered 2014-09-08: 0.6 mg via INTRAVENOUS

## 2014-09-08 MED ORDER — LIDOCAINE HCL (CARDIAC) 20 MG/ML IV SOLN
INTRAVENOUS | Status: DC | PRN
  Administered 2014-09-08: 50 mg via INTRAVENOUS

## 2014-09-08 MED ORDER — HYDROMORPHONE HCL 1 MG/ML IJ SOLN
INTRAMUSCULAR | Status: AC
  Filled 2014-09-08: qty 1

## 2014-09-08 MED ORDER — MIDAZOLAM HCL 5 MG/5ML IJ SOLN
INTRAMUSCULAR | Status: DC | PRN
  Administered 2014-09-08 (×2): 1 mg via INTRAVENOUS

## 2014-09-08 MED ORDER — DOCUSATE SODIUM 100 MG PO CAPS
100.0000 mg | ORAL_CAPSULE | Freq: Every day | ORAL | Status: DC
  Administered 2014-09-09 – 2014-09-10 (×2): 100 mg via ORAL
  Filled 2014-09-08 (×2): qty 1

## 2014-09-08 MED ORDER — PROPOFOL 10 MG/ML IV BOLUS
INTRAVENOUS | Status: DC | PRN
  Administered 2014-09-08: 150 mg via INTRAVENOUS

## 2014-09-08 MED ORDER — OXYCODONE HCL 5 MG/5ML PO SOLN: 5.0000 mg | Freq: Once | ORAL | Status: DC | PRN

## 2014-09-08 MED ORDER — OXYCODONE HCL 5 MG PO TABS: 5.0000 mg | ORAL_TABLET | Freq: Once | ORAL | Status: DC | PRN

## 2014-09-08 MED ORDER — ONDANSETRON HCL 4 MG/2ML IJ SOLN: 4.0000 mg | Freq: Once | INTRAMUSCULAR | Status: DC | PRN

## 2014-09-08 MED ORDER — PHENYLEPHRINE 40 MCG/ML (10ML) SYRINGE FOR IV PUSH (FOR BLOOD PRESSURE SUPPORT)
PREFILLED_SYRINGE | INTRAVENOUS | Status: AC
  Filled 2014-09-08: qty 10

## 2014-09-08 MED ORDER — MIDAZOLAM HCL 2 MG/2ML IJ SOLN
INTRAMUSCULAR | Status: AC
  Filled 2014-09-08: qty 2

## 2014-09-08 MED ORDER — ROCURONIUM BROMIDE 50 MG/5ML IV SOLN
INTRAVENOUS | Status: AC
  Filled 2014-09-08: qty 1

## 2014-09-08 MED ORDER — CITALOPRAM HYDROBROMIDE 20 MG PO TABS
10.0000 mg | ORAL_TABLET | Freq: Every day | ORAL | Status: DC
  Administered 2014-09-09 – 2014-09-10 (×2): 10 mg via ORAL
  Filled 2014-09-08 (×2): qty 1

## 2014-09-08 MED ORDER — TAMOXIFEN CITRATE 20 MG PO TABS
20.0000 mg | ORAL_TABLET | Freq: Every day | ORAL | Status: DC
  Administered 2014-09-09 – 2014-09-10 (×2): 20 mg via ORAL
  Filled 2014-09-08 (×3): qty 1

## 2014-09-08 MED ORDER — EPHEDRINE SULFATE 50 MG/ML IJ SOLN
INTRAMUSCULAR | Status: DC | PRN
  Administered 2014-09-08: 10 mg via INTRAVENOUS

## 2014-09-08 MED ORDER — METHOCARBAMOL 500 MG PO TABS
500.0000 mg | ORAL_TABLET | Freq: Four times a day (QID) | ORAL | Status: DC
  Administered 2014-09-08 – 2014-09-10 (×6): 500 mg via ORAL
  Filled 2014-09-08 (×6): qty 1

## 2014-09-08 MED ORDER — GLYCOPYRROLATE 0.2 MG/ML IJ SOLN
INTRAMUSCULAR | Status: AC
  Filled 2014-09-08: qty 3

## 2014-09-08 MED ORDER — 0.9 % SODIUM CHLORIDE (POUR BTL) OPTIME
TOPICAL | Status: DC | PRN
  Administered 2014-09-08 (×2): 1000 mL

## 2014-09-08 MED ORDER — VANCOMYCIN HCL IN DEXTROSE 1-5 GM/200ML-% IV SOLN
1000.0000 mg | Freq: Two times a day (BID) | INTRAVENOUS | Status: DC
  Administered 2014-09-09 (×2): 1000 mg via INTRAVENOUS
  Filled 2014-09-08 (×3): qty 200

## 2014-09-08 MED ORDER — FENTANYL CITRATE (PF) 250 MCG/5ML IJ SOLN
INTRAMUSCULAR | Status: AC
  Filled 2014-09-08: qty 5

## 2014-09-08 MED ORDER — SODIUM CHLORIDE 0.9 % IV SOLN
INTRAVENOUS | Status: DC | PRN
  Administered 2014-09-08: 1000 mL

## 2014-09-08 MED ORDER — FENTANYL CITRATE 0.05 MG/ML IJ SOLN
INTRAMUSCULAR | Status: AC
  Filled 2014-09-08: qty 5

## 2014-09-08 MED ORDER — LACTATED RINGERS IV SOLN
INTRAVENOUS | Status: DC | PRN
Start: 2014-09-08 — End: 2014-09-08
  Administered 2014-09-08 (×4): via INTRAVENOUS

## 2014-09-08 MED ORDER — FENTANYL CITRATE (PF) 250 MCG/5ML IJ SOLN
INTRAMUSCULAR | Status: DC | PRN
  Administered 2014-09-08: 50 ug via INTRAVENOUS
  Administered 2014-09-08 (×2): 25 ug via INTRAVENOUS
  Administered 2014-09-08: 50 ug via INTRAVENOUS
  Administered 2014-09-08: 100 ug via INTRAVENOUS
  Administered 2014-09-08 (×2): 50 ug via INTRAVENOUS

## 2014-09-08 MED ORDER — ROCURONIUM BROMIDE 50 MG/5ML IV SOLN
INTRAVENOUS | Status: AC
  Filled 2014-09-08: qty 3

## 2014-09-08 MED ORDER — HYDROMORPHONE HCL 2 MG PO TABS
2.0000 mg | ORAL_TABLET | ORAL | Status: DC | PRN
  Administered 2014-09-09: 2 mg via ORAL
  Filled 2014-09-08: qty 1

## 2014-09-08 MED ORDER — FUROSEMIDE 40 MG PO TABS
40.0000 mg | ORAL_TABLET | Freq: Every day | ORAL | Status: DC
  Administered 2014-09-09 – 2014-09-10 (×2): 40 mg via ORAL
  Filled 2014-09-08 (×2): qty 1

## 2014-09-08 MED ORDER — LACTATED RINGERS IV SOLN: INTRAVENOUS | Status: DC

## 2014-09-08 MED ORDER — ONDANSETRON HCL 4 MG/2ML IJ SOLN
INTRAMUSCULAR | Status: AC
  Filled 2014-09-08: qty 2

## 2014-09-08 MED ORDER — ARTIFICIAL TEARS OP OINT
TOPICAL_OINTMENT | OPHTHALMIC | Status: DC | PRN
Start: 2014-09-08 — End: 2014-09-08
  Administered 2014-09-08: 1 via OPHTHALMIC

## 2014-09-08 MED ORDER — ONDANSETRON HCL 4 MG/2ML IJ SOLN
INTRAMUSCULAR | Status: DC | PRN
  Administered 2014-09-08: 4 mg via INTRAVENOUS

## 2014-09-08 MED ORDER — FLUCONAZOLE 100 MG PO TABS
100.0000 mg | ORAL_TABLET | Freq: Every day | ORAL | Status: DC
  Administered 2014-09-09 – 2014-09-10 (×2): 100 mg via ORAL
  Filled 2014-09-08 (×2): qty 1

## 2014-09-08 MED ORDER — NEOSTIGMINE METHYLSULFATE 10 MG/10ML IV SOLN
INTRAVENOUS | Status: AC
  Filled 2014-09-08: qty 1

## 2014-09-08 MED ORDER — PROMETHAZINE HCL 25 MG/ML IJ SOLN: 6.2500 mg | INTRAMUSCULAR | Status: DC | PRN

## 2014-09-08 SURGICAL SUPPLY — 88 items
ADH SKN CLS APL DERMABOND .7 (GAUZE/BANDAGES/DRESSINGS)
APPLIER CLIP 9.375 MED OPEN (MISCELLANEOUS) ×6
APR CLP MED 9.3 20 MLT OPN (MISCELLANEOUS) ×3
ATCH SMKEVC FLXB CAUT HNDSWH (FILTER) ×1 IMPLANT
BAG DECANTER FOR FLEXI CONT (MISCELLANEOUS) ×2 IMPLANT
BINDER BREAST LRG (GAUZE/BANDAGES/DRESSINGS) IMPLANT
BINDER BREAST XLRG (GAUZE/BANDAGES/DRESSINGS) ×2 IMPLANT
BINDER BREAST XXLRG (GAUZE/BANDAGES/DRESSINGS) IMPLANT
BIOPATCH RED 1 DISK 7.0 (GAUZE/BANDAGES/DRESSINGS) ×4 IMPLANT
BIOPATCH WHT 1IN DISK W/4.0 H (GAUZE/BANDAGES/DRESSINGS) ×2 IMPLANT
BLADE 10 SAFETY STRL DISP (BLADE) ×2 IMPLANT
BLADE 15 SAFETY STRL DISP (BLADE) ×4 IMPLANT
BLADE SURG 15 STRL LF DISP TIS (BLADE) ×1 IMPLANT
BLADE SURG 15 STRL SS (BLADE) ×2
CANISTER SUCTION 2500CC (MISCELLANEOUS) ×2 IMPLANT
CHLORAPREP W/TINT 26ML (MISCELLANEOUS) IMPLANT
CLIP APPLIE 9.375 MED OPEN (MISCELLANEOUS) ×3 IMPLANT
CONNECTOR 5 IN 1 STRAIGHT STRL (MISCELLANEOUS) ×2 IMPLANT
COVER SURGICAL LIGHT HANDLE (MISCELLANEOUS) ×2 IMPLANT
DERMABOND ADVANCED (GAUZE/BANDAGES/DRESSINGS)
DERMABOND ADVANCED .7 DNX12 (GAUZE/BANDAGES/DRESSINGS) IMPLANT
DRAIN CHANNEL 19F RND (DRAIN) ×6 IMPLANT
DRAPE CHEST BREAST 15X10 FENES (DRAPES) ×2 IMPLANT
DRAPE INCISE 23X17 IOBAN STRL (DRAPES) ×1
DRAPE INCISE IOBAN 23X17 STRL (DRAPES) ×1 IMPLANT
DRAPE ORTHO SPLIT 77X108 STRL (DRAPES) ×8
DRAPE PROXIMA HALF (DRAPES) ×8 IMPLANT
DRAPE SURG 17X23 STRL (DRAPES) ×8 IMPLANT
DRAPE SURG ORHT 6 SPLT 77X108 (DRAPES) ×4 IMPLANT
DRAPE WARM FLUID 44X44 (DRAPE) ×2 IMPLANT
DRSG PAD ABDOMINAL 8X10 ST (GAUZE/BANDAGES/DRESSINGS) ×4 IMPLANT
DRSG SORBAVIEW 3.5X5-5/16 MED (GAUZE/BANDAGES/DRESSINGS) ×4 IMPLANT
DRSG TEGADERM 2-3/8X2-3/4 SM (GAUZE/BANDAGES/DRESSINGS) ×2 IMPLANT
DRSG TEGADERM 4X4.75 (GAUZE/BANDAGES/DRESSINGS) IMPLANT
ELECT BLADE 6.5 EXT (BLADE) ×2 IMPLANT
ELECT CAUTERY BLADE 6.4 (BLADE) ×6 IMPLANT
ELECT REM PT RETURN 9FT ADLT (ELECTROSURGICAL) ×2
ELECTRODE REM PT RTRN 9FT ADLT (ELECTROSURGICAL) ×1 IMPLANT
EVACUATOR SILICONE 100CC (DRAIN) ×4 IMPLANT
EVACUATOR SMOKE ACCUVAC VALLEY (FILTER) ×1
GAUZE SPONGE 4X4 12PLY STRL (GAUZE/BANDAGES/DRESSINGS) IMPLANT
GAUZE VASELINE 3X9 (GAUZE/BANDAGES/DRESSINGS) ×4 IMPLANT
GAUZE XEROFORM 5X9 LF (GAUZE/BANDAGES/DRESSINGS) IMPLANT
GLOVE BIO SURGEON STRL SZ 6.5 (GLOVE) ×6 IMPLANT
GLOVE BIO SURGEON STRL SZ7 (GLOVE) ×2 IMPLANT
GLOVE BIO SURGEON STRL SZ7.5 (GLOVE) ×8 IMPLANT
GLOVE BIOGEL PI IND STRL 7.0 (GLOVE) ×7 IMPLANT
GLOVE BIOGEL PI IND STRL 7.5 (GLOVE) ×1 IMPLANT
GLOVE BIOGEL PI IND STRL 8 (GLOVE) ×2 IMPLANT
GLOVE BIOGEL PI INDICATOR 7.0 (GLOVE) ×7
GLOVE BIOGEL PI INDICATOR 7.5 (GLOVE) ×1
GLOVE BIOGEL PI INDICATOR 8 (GLOVE) ×2
GOWN STRL REUS W/ TWL LRG LVL3 (GOWN DISPOSABLE) ×1 IMPLANT
GOWN STRL REUS W/ TWL XL LVL3 (GOWN DISPOSABLE) ×1 IMPLANT
GOWN STRL REUS W/TWL LRG LVL3 (GOWN DISPOSABLE) ×2
GOWN STRL REUS W/TWL XL LVL3 (GOWN DISPOSABLE) ×2
IMPL SALINE MOD 475CC (Breast) ×1 IMPLANT
IMPLANT SALINE MOD 475CC (Breast) ×2 IMPLANT
IMPLANT SIZER ×2 IMPLANT
KIT BASIN OR (CUSTOM PROCEDURE TRAY) ×2 IMPLANT
KIT ROOM TURNOVER OR (KITS) ×2 IMPLANT
MARKER SKIN DUAL TIP RULER LAB (MISCELLANEOUS) ×2 IMPLANT
NS IRRIG 1000ML POUR BTL (IV SOLUTION) ×4 IMPLANT
PACK GENERAL/GYN (CUSTOM PROCEDURE TRAY) ×2 IMPLANT
PAD ABD 8X10 STRL (GAUZE/BANDAGES/DRESSINGS) ×4 IMPLANT
PAD ARMBOARD 7.5X6 YLW CONV (MISCELLANEOUS) ×4 IMPLANT
PENCIL BUTTON HOLSTER BLD 10FT (ELECTRODE) ×6 IMPLANT
PREFILTER EVAC NS 1 1/3-3/8IN (MISCELLANEOUS) ×2 IMPLANT
SPONGE GAUZE 4X4 12PLY STER LF (GAUZE/BANDAGES/DRESSINGS) ×2 IMPLANT
SPONGE LAP 18X18 X RAY DECT (DISPOSABLE) IMPLANT
STAPLER VISISTAT 35W (STAPLE) ×4 IMPLANT
SUT MNCRL AB 3-0 PS2 18 (SUTURE) ×10 IMPLANT
SUT MON AB 2-0 CT1 36 (SUTURE) IMPLANT
SUT PDS AB 0 CT 36 (SUTURE) ×4 IMPLANT
SUT PDS AB 3-0 SH 27 (SUTURE) IMPLANT
SUT PROLENE 3 0 PS 1 (SUTURE) IMPLANT
SUT PROLENE 3 0 PS 2 (SUTURE) ×8 IMPLANT
SUT VIC AB 3-0 FS2 27 (SUTURE) IMPLANT
SUT VIC AB 3-0 SH 18 (SUTURE) ×8 IMPLANT
SUT VIC AB 3-0 SH 8-18 (SUTURE) ×2 IMPLANT
SYR 50ML SLIP (SYRINGE) ×2 IMPLANT
SYR BULB IRRIGATION 50ML (SYRINGE) ×4 IMPLANT
TOWEL OR 17X24 6PK STRL BLUE (TOWEL DISPOSABLE) ×4 IMPLANT
TOWEL OR 17X26 10 PK STRL BLUE (TOWEL DISPOSABLE) ×4 IMPLANT
TRAY FOLEY CATH 14FR (SET/KITS/TRAYS/PACK) ×2 IMPLANT
TRAY FOLEY CATH 14FRSI W/METER (CATHETERS) IMPLANT
TRAY FOLEY CATH 16FR SILVER (SET/KITS/TRAYS/PACK) IMPLANT
TUBE CONNECTING 12X1/4 (SUCTIONS) ×8 IMPLANT

## 2014-09-08 NOTE — H&P (Signed)
I have re-examined and re-evaluated the patient and there are no changes. See H&P from office in paper chart.  Planned procedure: Removal of tissue expander and possible latissimus flap with implant for reconstruction left breast

## 2014-09-08 NOTE — Brief Op Note (Signed)
09/08/2014  8:12 PM  PATIENT:  [name]  69 y.o. female  PRE-OPERATIVE DIAGNOSIS:  LEFT BREAST CANCER  POST-OPERATIVE DIAGNOSIS:  LEFT BREAST CANCER  PROCEDURE:  Procedure(s): REMOVAL OF TISSUE EXPANDER FROM LEFT BREAST (Left) LEFT LATISSIMUS FLAP TO BREAST WITH SALINE IMPLANT FOR BREAST RECONSTRUCTION (Left)  SURGEON:  Surgeon(s) and Role:    * Crissie Reese, MD - Primary  PHYSICIAN ASSISTANT:   ASSISTANTS: none   ANESTHESIA:   general  EBL:  80  cc  BLOOD ADMINISTERED:none  DRAINS: (3) Jackson-Pratt drain(s) with closed bulb suction in the left back donor site (2) and left chest (1)   LOCAL MEDICATIONS USED:  NONE  SPECIMEN:  No Specimen  DISPOSITION OF SPECIMEN:  N/A  COUNTS:  YES  TOURNIQUET:  * No tourniquets in log *  DICTATION: .Other Dictation: Dictation Number [id number]  PLAN OF CARE: Admit to inpatient   PATIENT DISPOSITION:  PACU - hemodynamically stable.   Delay start of Pharmacological VTE agent (>24hrs) due to surgical blood loss or risk of bleeding: yes

## 2014-09-08 NOTE — Anesthesia Preprocedure Evaluation (Addendum)
Anesthesia Evaluation  Patient identified by MRN, date of birth, ID band Patient awake    Reviewed: Allergy & Precautions, NPO status   Airway Mallampati: II  TM Distance: >3 FB Neck ROM: Full    Dental no notable dental hx.    Pulmonary  breath sounds clear to auscultation  Pulmonary exam normal       Cardiovascular hypertension, Rhythm:Regular Rate:Normal     Neuro/Psych Anxiety Depression    GI/Hepatic   Endo/Other    Renal/GU      Musculoskeletal  (+) Arthritis -,   Abdominal   Peds  Hematology   Anesthesia Other Findings   Reproductive/Obstetrics                            Anesthesia Physical Anesthesia Plan  ASA: II  Anesthesia Plan: General   Post-op Pain Management:    Induction: Intravenous  Airway Management Planned: Oral ETT  Additional Equipment:   Intra-op Plan:   Post-operative Plan: Extubation in OR  Informed Consent: I have reviewed the patients History and Physical, chart, labs and discussed the procedure including the risks, benefits and alternatives for the proposed anesthesia with the patient or authorized representative who has indicated his/her understanding and acceptance.   Dental advisory given  Plan Discussed with: CRNA  Anesthesia Plan Comments:                                          Anesthesia Evaluation  Patient identified by MRN, date of birth, ID band Patient awake    Reviewed: Allergy & Precautions, NPO status , Patient's Chart, lab work & pertinent test results  History of Anesthesia Complications Negative for: history of anesthetic complications  Airway Mallampati: II  TM Distance: >3 FB Neck ROM: Full    Dental  (+) Dental Advisory Given, Teeth Intact   Pulmonary shortness of breath and with exertion,  breath sounds clear to auscultation        Cardiovascular hypertension, Rhythm:Regular  Rate:Normal  '09 EF 65%   Neuro/Psych  Headaches, PSYCHIATRIC DISORDERS Depression    GI/Hepatic Neg liver ROS, hiatal hernia, GERD-  Medicated and Controlled,  Endo/Other  negative endocrine ROS  Renal/GU negative Renal ROS     Musculoskeletal  (+) Arthritis -,   Abdominal   Peds  Hematology negative hematology ROS (+)   Anesthesia Other Findings Breast cancer  Reproductive/Obstetrics                          Anesthesia Physical Anesthesia Plan  ASA: III  Anesthesia Plan: General   Post-op Pain Management:    Induction: Intravenous  Airway Management Planned: Oral ETT  Additional Equipment:   Intra-op Plan:   Post-operative Plan: Extubation in OR  Informed Consent: I have reviewed the patients History and Physical, chart, labs and discussed the procedure including the risks, benefits and alternatives for the proposed anesthesia with the patient or authorized representative who has indicated his/her understanding and acceptance.   Dental advisory given  Plan Discussed with: Anesthesiologist, CRNA and Surgeon  Anesthesia Plan Comments: (Plan routine monitors, GETA)       Anesthesia Quick Evaluation  Anesthesia Evaluation  Patient identified by MRN, date of birth, ID band Patient awake    Reviewed: Allergy & Precautions, NPO status , Patient's Chart, lab work & pertinent test results  History of Anesthesia Complications Negative for: history of anesthetic complications  Airway Mallampati: II  TM Distance: >3 FB Neck ROM: Full    Dental  (+) Dental Advisory Given, Teeth Intact   Pulmonary shortness of breath and with exertion,  breath sounds clear to auscultation        Cardiovascular hypertension, Rhythm:Regular Rate:Normal  '09 EF 65%   Neuro/Psych  Headaches, PSYCHIATRIC DISORDERS Depression    GI/Hepatic Neg liver ROS, hiatal hernia, GERD-  Medicated and  Controlled,  Endo/Other  negative endocrine ROS  Renal/GU negative Renal ROS     Musculoskeletal  (+) Arthritis -,   Abdominal   Peds  Hematology negative hematology ROS (+)   Anesthesia Other Findings Breast cancer  Reproductive/Obstetrics                          Anesthesia Physical Anesthesia Plan  ASA: III  Anesthesia Plan: General   Post-op Pain Management:    Induction: Intravenous  Airway Management Planned: Oral ETT  Additional Equipment:   Intra-op Plan:   Post-operative Plan: Extubation in OR  Informed Consent: I have reviewed the patients History and Physical, chart, labs and discussed the procedure including the risks, benefits and alternatives for the proposed anesthesia with the patient or authorized representative who has indicated his/her understanding and acceptance.   Dental advisory given  Plan Discussed with: Anesthesiologist, CRNA and Surgeon  Anesthesia Plan Comments: (Plan routine monitors, GETA)       Anesthesia Quick Evaluation  Anesthesia Quick Evaluation

## 2014-09-08 NOTE — Anesthesia Postprocedure Evaluation (Signed)
  Anesthesia Post-op Note  Patient: [name]  Procedure(s) Performed: Procedure(s): REMOVAL OF TISSUE EXPANDER FROM LEFT BREAST (Left) LEFT LATISSIMUS FLAP TO BREAST WITH SALINE IMPLANT FOR BREAST RECONSTRUCTION (Left)  Patient Location: PACU  Anesthesia Type: No value filed.   Level of Consciousness: awake, alert  and oriented  Airway and Oxygen Therapy: Patient Spontanous Breathing  Post-op Pain: mild  Post-op Assessment: Post-op Vital signs reviewed  Post-op Vital Signs: Reviewed  Last Vitals:  Filed Vitals:   09/08/14 2121  BP: 141/76  Pulse: 74  Temp:   Resp: 16    Complications: No apparent anesthesia complications

## 2014-09-08 NOTE — Anesthesia Procedure Notes (Signed)
Procedure Name: Intubation Date/Time: 09/08/2014 2:15 PM Performed by: Susa Loffler Pre-anesthesia Checklist: Patient identified, Timeout performed, Emergency Drugs available, Suction available and Patient being monitored Patient Re-evaluated:Patient Re-evaluated prior to inductionOxygen Delivery Method: Circle system utilized Preoxygenation: Pre-oxygenation with 100% oxygen Intubation Type: IV induction Ventilation: Mask ventilation without difficulty and Oral airway inserted - appropriate to patient size Laryngoscope Size: Mac and 4 Grade View: Grade II Tube type: Oral Tube size: 7.5 mm Airway Equipment and Method: Stylet and Oral airway Placement Confirmation: ETT inserted through vocal cords under direct vision,  positive ETCO2 and breath sounds checked- equal and bilateral Secured at: 22 cm Tube secured with: Tape Dental Injury: Teeth and Oropharynx as per pre-operative assessment

## 2014-09-08 NOTE — Progress Notes (Signed)
ANTIBIOTIC CONSULT NOTE - INITIAL  Pharmacy Consult for Vancomycin Indication: surgical prophylaxis  Allergies  Allergen Reactions  . Ace Inhibitors     REACTION: angioedema  . Morphine And Related     "crazy"  . Codeine Rash  . Sulfonamide Derivatives Rash  . Telmisartan Rash    Patient Measurements: Height: 67 inches Weight: 183 lb (83.008 kg)  Vital Signs: Temp: 98 F (36.7 C) (04/14 2100) Temp Source: Oral (04/14 1150) BP: 141/76 mmHg (04/14 2121) Pulse Rate: 74 (04/14 2121)  Pre-op abs from 09/01/14:  creatinine = 0.82, K+ 3.7  WBC = 5.4  Hemoglobin 10.7, Platelet count 118 (range 96-118, 4/4-09/01/14)  Estimated Creatinine Clearance: 72.8 mL/min (by C-G formula based on Cr of 0.82).   Medical History: Past Medical History  Diagnosis Date  . GERD (gastroesophageal reflux disease)   . Depression   . Vitamin D deficiency   . Vitamin B12 deficiency   . Lichen sclerosus   . Family history of breast cancer   . Family history of colon cancer   . Family history of pancreatic cancer   . Dyspnea     Normal Spirometry 03/2008 EF 65% BNP normal 11/2007  . Blood dyscrasia     bruises and bleed easily  . History of hiatal hernia   . Complication of anesthesia     went to sleep easily but hard to wake up up until elbow OR in 2010  . Arthritis     "spine" (07/28/2014)  . Chronic back pain     "all over"  . Mild anxiety   . Breast cancer, right breast 1995  . Melanoma 2010    "right elbow; treated at [facility]"  . Breast cancer, left breast 07/28/2014   Assessment:  69 yr old female s/p removal of tissue expander from left breast; saline implant reconstruction.   Vancomycin 1 gram IV given pre-op at 2pm.   3 drains in place.  Goal of Therapy:  Vancomycin trough level 10-15 mcg/ml  Plan:   Vancomycin 1 gram IV q12hrs.  Will follow up for stop time.  Expect short course.  No levels planned.  Arty Baumgartner, [place] Pager: [phone] 09/08/2014,10:16 PM

## 2014-09-08 NOTE — Transfer of Care (Signed)
Immediate Anesthesia Transfer of Care Note  Patient: [name]  Procedure(s) Performed: Procedure(s): REMOVAL OF TISSUE EXPANDER FROM LEFT BREAST (Left) LEFT LATISSIMUS FLAP TO BREAST WITH SALINE IMPLANT FOR BREAST RECONSTRUCTION (Left)  Patient Location: PACU  Anesthesia Type:General  Level of Consciousness: sedated  Airway & Oxygen Therapy: Patient Spontanous Breathing and Patient connected to nasal cannula oxygen  Post-op Assessment: Report given to RN, Post -op Vital signs reviewed and stable and Patient moving all extremities  Post vital signs: Reviewed and stable  Last Vitals:  Filed Vitals:   09/08/14 1150  BP: 122/63  Pulse: 71  Temp: 36.5 C  Resp: 16    Complications: No apparent anesthesia complications

## 2014-09-09 MED ORDER — DOXYCYCLINE HYCLATE 100 MG PO TABS
100.0000 mg | ORAL_TABLET | Freq: Two times a day (BID) | ORAL | Status: DC
  Administered 2014-09-09 – 2014-09-10 (×2): 100 mg via ORAL
  Filled 2014-09-09 (×2): qty 1

## 2014-09-09 MED ORDER — ENOXAPARIN SODIUM 40 MG/0.4ML ~~LOC~~ SOLN
40.0000 mg | SUBCUTANEOUS | Status: DC
  Administered 2014-09-09 – 2014-09-10 (×2): 40 mg via SUBCUTANEOUS
  Filled 2014-09-09 (×2): qty 0.4

## 2014-09-09 NOTE — Op Note (Signed)
NAMEALISEA, MATTE                ACCOUNT NO.:  [id number]  MEDICAL RECORD NO.:  [id number]  LOCATION:  [id number]                        FACILITY:  [facility]  PHYSICIAN:  Crissie Reese, M.D.     DATE OF BIRTH:  1946/03/06  DATE OF PROCEDURE:  09/08/2014 DATE OF DISCHARGE:                              OPERATIVE REPORT   PREOPERATIVE DIAGNOSES: 1. Breast cancer. 2. Possible infection, left chest tissue expander.  POSTOPERATIVE DIAGNOSES: 1. Breast cancer. 2. No infection, left chest.  PROCEDURE PERFORMED: 1. Removal of left chest tissue expander as a staged procedure. 2. Latissimus myocutaneous flap, left side as a stage  procedure. 3. Delayed breast reconstruction with saline implant as a stage     procedure.  SURGEON:  Crissie Reese, M.D.  ANESTHESIA:  General.  ESTIMATED BLOOD LOSS:  80 mL.  DRAINS:  Three 19-French, 1 in the left chest, 2 with the back donor site.  CLINICAL NOTE:  A 69 year old woman, who had left breast cancer and had a mastectomy and placement of tissue expander.  She did well  from that surgery, but did have a prolonged drainage from one of the drain tubes that was in place for approximately 6-7 weeks.  At one point, the drain tube begin to advance out, and the patient did push it back up inside and this prompted [place] to go ahead and do a definitive surgery.  The fact that there was prolonged drainage and the fact that she had pushed the drain back into the chest prompted the decision to proceed with a planned staged procedure for breast reconstruction.  In addition, she had developed some rash over her upper chest over this left reconstruction.  This itself was possibly yeast, but it was not responding as one would expect to Diflucan and topical agents.  This also gave some concern that there might be an infection. It was felt that the best option was to explore and to go ahead and try to do a definitive reconstruction at this time if things looked okay  in the space around the tissue expander.  She understood that if not then we would simply remove the tissue expander and do a delayed reconstruction 6 months later.  She accepted this plan.  She understood the nature of the procedure and the risks include, but not limited to, bleeding, infection, healing problems, scarring, loss of sensation, fluid accumulations, anesthesia complications, pneumothorax, DVT, PE, failure of device, capsular contracture, displacement of device, wrinkles and ripples, contour deformities, contour deformities of the periphery of the reconstruction, loss of tissue, loss of portions of the flap, loss of range of motion and/or strength of the left arm and shoulder, and asymmetry with the opposite side.  She understood all this and wished to proceed.  DESCRIPTION OF PROCEDURE:  The patient was marked in the holding area for the flap.  She was taken to the operating room and placed supine. After successful induction of anesthesia, she was prepped with Betadine and then draped with sterile drapes.  The  mastectomy scar was opened and the dissection carried down through the subcutaneous tissue and deep to the muscle and the tissue expander was encountered and then it  was deflated and gently removed.  The space was inspected and noted to be in good condition.  Some gentle debridement was performed but there really was not anything out of the ordinary.  There was no fluid, nothing to culture.  Thorough irrigation with saline as well as antibiotic solution and antibiotic soaked laps were placed in the space and left there and the wound was stapled closed and covered with a sterile large Steri- Drape.  The patient was then placed in a right lateral decubitus position with an axillary roll and stabilized by beanbag.  She was prepped with Betadine and draped with sterile drapes.  The planned skin paddle was incised and the dissection carried into subcutaneous tissue  beveling superior and inferior in order to ensure a broader attachment of soft tissue at the level of the muscle, then present at the level of the skin, and this was done to ensure blood supply to the skin paddle.  The dissection was then continued medial, lateral, superior, and inferior to expose the extent of the latissimus muscle, which was then released inferiorly and reflected in a cephalad direction with larger perforating vessels ligated using double Ligaclips and divided as needed or as larger vessels suture ligated with 3-0 Vicryl suture ligatures.  At the conclusion of the dissection, the flap was inspected and noted to have excellent color with bright red bleeding at the periphery consistent with viability.  A tunnel was then made to the front and the opening then made through to the anterior chest and the laps were then removed and the flap was passed gently through this space again placing antibiotic solution prior to passing the lap through.  There was no tension on the flap.  Thorough irrigation with saline as well as antibiotic solution and meticulous hemostasis was achieved using electrocautery.  Two 19-French drains were positioned, brought through separate stab wounds anteroinferiorly and secured with 3-0 Prolene sutures.  The skin was then closed with 0 PDS interrupted inverted deep sutures, 3-0 Monocryl interrupted inverted deep dermal sutures, and 3-0 Monocryl running subcuticular suture with a couple of 3-0 Prolene sutures interrupted at the central aspect for additional support.  The drains were dressed with Biopatch and the SorbaView and dry sterile dressings placed over the back incision.  The patient was then placed in a supine position.  Attention was directed to the anterior chest, where the wound was then exposed, and then she was again prepped with Betadine and draped with sterile drapes.  The skin staples were removed.  The flap was exposed. It was  inspected and found to have excellent color and again bright red bleeding around the periphery consistent viability.  Irrigation with saline in the space and then antibiotic solution was placed and a drain was placed and brought out through the separate stab wound inferolaterally and secured with a 3-0 Prolene suture.  The muscle was inset inferiorly using 3-0 Vicryl sutures  and medially as well in the same manner and then 3-0 Vicryl sutures were pre-placed but left untied for the superior closures.  After thoroughly cleaning gloves, the implant was prepared.  This was Mentor 475 mL maximum fill saline implant.  It was soaked in antibiotic solution and 150 mL sterile saline placed using a closed filling system.  The implant was then positioned to make sure that it was positioned to the inferior extent of the space and no folds or wrinkles.  Antibiotic solution again placed and the implant was filled with the maximum  475 mL using the closed filling system.  Again, antibiotic solution placed, the 3-0 Vicryl sutures had been pre-placed and were tied and this gave a total muscle coverage for the implant.  The wound was irrigated with antibiotic solution and the skin paddle inset with 3-0 Monocryl interrupted inverted deep dermal sutures, 3-0 Monocryl running subcuticular suture, and a few simple interrupted Prolenes as needed.  The skin paddle had excellent color and capillary refill 1-2 seconds at the conclusion with consistent viability.  The Vaseline gauze was placed over the incisions and ABDs and the drain was dressed again with Biopatch and the SorbaView.  The chest binder was placed gently, and she was transferred to the recovery room in stable having tolerated the procedure well.     Crissie Reese, M.D.     DB/MEDQ  D:  09/08/2014  T:  09/09/2014  Job:  [id number]  cc:   Crissie Reese, MD

## 2014-09-09 NOTE — Plan of Care (Signed)
Problem: Phase I Progression Outcomes Goal: Pain controlled with appropriate interventions Outcome: Completed/Met Date Met:  09/09/14 Taking only Methocarbamol for pain.

## 2014-09-09 NOTE — Plan of Care (Signed)
Problem: Phase I Progression Outcomes Goal: OOB as tolerated unless otherwise ordered Outcome: Completed/Met Date Met:  09/09/14 Up to Bathroom with assistance

## 2014-09-09 NOTE — Progress Notes (Signed)
Subjective: Sore but good pain control. No nausea. Tolerating diet well.  Objective: Vital signs in last 24 hours: Temp:  [97.7 F (36.5 C)-98.8 F (37.1 C)] 98.8 F (37.1 C) (04/15 0651) Pulse Rate:  [71-87] 83 (04/15 0651) Resp:  [13-25] 18 (04/15 0651) BP: (122-152)/(52-79) 151/52 mmHg (04/15 0651) SpO2:  [93 %-100 %] 93 % (04/15 0651) Weight:  [182 lb 15.7 oz (83 kg)-183 lb (83.008 kg)] 182 lb 15.7 oz (83 kg) (04/14 2219)  Intake/Output from previous day: 04/14 0701 - 04/15 0700 In: 4106.3 [I.V.:3906.3; IV Piggyback:200] Out: 1184 [Urine:850; Drains:234; Blood:100] Intake/Output this shift:    Operative sites: Mastectomy flaps viable. Rash is a little better. Flap viable. Good color throughout skin paddle. Implant is in good position. Drains functioning. Drainage thin. No evidence of bleeding or infection at either site.  No results for input(s): WBC, HGB, HCT, NA, K, CL, CO2, BUN, CREATININE, GLU in the last 72 hours.  Invalid input(s): PLATELETS  Studies/Results: No results found.  Assessment/Plan: DVT prohylaxis and antibiotic prophylaxis continue. Ambulate. D/C foley and IV fluids.   LOS: 1 day    Jonanthan Bolender M 09/09/2014 9:22 AM

## 2014-09-09 NOTE — Care Management Note (Signed)
  Page 1 of 1   09/09/2014     10:19:37 AM CARE MANAGEMENT NOTE 09/09/2014  Patient:  [name], [name]   Account Number:  [id number]  Date Initiated:  09/09/2014  Documentation initiated by:  Magdalen Spatz  Subjective/Objective Assessment:     Action/Plan:   Anticipated DC Date:  09/10/2014   Anticipated DC Plan:  HOME/SELF CARE         Choice offered to / List presented to:             Status of service:   Medicare Important Message given?  YES (If response is "NO", the following Medicare IM given date fields will be blank) Date Medicare IM given:  09/09/2014 Medicare IM given by:  Magdalen Spatz Date Additional Medicare IM given:   Additional Medicare IM given by:    Discharge Disposition:    Per UR Regulation:  Reviewed for med. necessity/level of care/duration of stay  If discussed at [place] of Stay Meetings, dates discussed:    Comments:

## 2014-09-10 MED ORDER — ENOXAPARIN SODIUM 40 MG/0.4ML ~~LOC~~ SOLN: 40.0000 mg | SUBCUTANEOUS | Status: DC

## 2014-09-10 MED ORDER — HYDROMORPHONE HCL 2 MG PO TABS: 2.0000 mg | ORAL_TABLET | ORAL | Status: DC | PRN

## 2014-09-10 MED ORDER — DOCUSATE SODIUM 100 MG PO CAPS: 100.0000 mg | ORAL_CAPSULE | Freq: Every day | ORAL | Status: DC

## 2014-09-10 MED ORDER — DOXYCYCLINE HYCLATE 100 MG PO TABS: 100.0000 mg | ORAL_TABLET | Freq: Two times a day (BID) | ORAL | Status: DC

## 2014-09-10 MED ORDER — METHOCARBAMOL 500 MG PO TABS: 500.0000 mg | ORAL_TABLET | Freq: Four times a day (QID) | ORAL | Status: DC

## 2014-09-10 NOTE — Discharge Instructions (Addendum)
No lifting for 6 weeks No vigorous activity for 6 weeks (including outdoor walks) No driving for 4 weeks OK to walk up stairs slowly Stay propped up Use incentive spirometer at home every hour while awake No shower while drains are in place Empty drains at least three times a day and record the amounts separately Change drain dressings every third day if instructed to do so by Dr. Harlow Mares  Apply Bacitracin antibiotic ointment to the drain sites  Place gauze dressing over drains  Secure the gauze with tape Take an over-the-counter Probiotic while on antibiotics Take an over-the-counter stool softener (such as Colace) while on pain medication See Dr. Harlow Mares at end of the upcoming week. Call Monday to arrange an appointment For questions call [phone] or [phone]

## 2014-09-10 NOTE — Progress Notes (Addendum)
Discharge instructions gone over with patient. Dressings changed per instructions. Home medications gone over. Prescriptions given to patient.  Follow up appointment is made. Diet, activity, drain care, and incisional care gone over. Reasons to call the doctor gone over. Patient verbalized understanding of instructions.

## 2014-09-10 NOTE — Discharge Summary (Signed)
Physician Discharge Summary  Patient ID: [name] MRN: [id number] DOB/AGE: 69-Jan-1947 69 y.o.  Admit date: 09/08/2014 Discharge date: 09/10/2014  Admission Diagnoses: left breast cancer  Discharge Diagnoses: same Active Problems:   Breast cancer   Discharged Condition: good  Hospital Course: On the day of admission the patient was taken to surgery and had removal left tissue expander, left latissimus flap, left breast implant for delayed breast reconstruction. The patient tolerated the procedures well. Postoperatively, the flap maintained excellent color and capillary refill. The patient was ambulatory and tolerating diet on the first postoperative day. Antibiotic and DVT prophylaxis continued. By day 2 she was ambulating well enough to be discharged.  Treatments: antibiotics: vancomycin, anticoagulation: LMW heparin and surgery: left removal expander, latissimus flap and placement of saline implant  Discharge Exam: Blood pressure 136/60, pulse 79, temperature 99.4 F (37.4 C), temperature source Oral, resp. rate 18, height 5\' 7"  (1.702 m), weight 182 lb 15.7 oz (83 kg), SpO2 96 %.  Operative sites: Mastectomy flaps viable. Still has a rash but it is improving. Flap viable. Good color and capillary refill throughout. Implant is in good position. Drains functioning. Drainage thin. No evidence of bleeding or infection at either site.  Disposition: 01-Home or Self Care     Medication List    TAKE these medications        albuterol 108 (90 BASE) MCG/ACT inhaler  Commonly known as:  PROVENTIL HFA;VENTOLIN HFA  Inhale 2 puffs into the lungs every 6 (six) hours as needed for wheezing or shortness of breath.     citalopram 10 MG tablet  Commonly known as:  CELEXA  Take 1 tablet (10 mg total) by mouth daily.     docusate sodium 100 MG capsule  Commonly known as:  COLACE  Take 1 capsule (100 mg total) by mouth daily.     doxycycline 100 MG tablet  Commonly known as:   VIBRA-TABS  Take 1 tablet (100 mg total) by mouth every 12 (twelve) hours.     enoxaparin 40 MG/0.4ML injection  Commonly known as:  LOVENOX  Inject 0.4 mLs (40 mg total) into the skin daily.     fluconazole 100 MG tablet  Commonly known as:  DIFLUCAN  Take 1 tablet (100 mg total) by mouth daily.     furosemide 40 MG tablet  Commonly known as:  LASIX  Take 1 tablet (40 mg total) by mouth daily.     HYDROmorphone 2 MG tablet  Commonly known as:  DILAUDID  Take 1-2 tablets (2-4 mg total) by mouth every 4 (four) hours as needed for moderate pain.     methocarbamol 500 MG tablet  Commonly known as:  ROBAXIN  Take 1 tablet (500 mg total) by mouth 4 (four) times daily.     OVER THE COUNTER MEDICATION  Probiotic 90 billion-Take 1 tablet by mouth daily.     tamoxifen 20 MG tablet  Commonly known as:  NOLVADEX  Take 1 tablet (20 mg total) by mouth daily.     Vitamin B-12 CR 1000 MCG Tbcr  Take 1 tablet by mouth daily.     Vitamin D3 1000 UNITS Caps  Take 1 capsule by mouth daily.         SignedHarlow Mares, Alaster Asfaw M 09/10/2014, 11:19 AM

## 2014-09-10 NOTE — Progress Notes (Signed)
Pt given breast cancer bag and explained.  Measuring device for JP drains reviewed and chart for recording drainage.  Pillow and JP holders given and explained.

## 2014-09-11 DIAGNOSIS — B9789 Other viral agents as the cause of diseases classified elsewhere: Principal | ICD-10-CM

## 2014-09-11 DIAGNOSIS — J069 Acute upper respiratory infection, unspecified: Secondary | ICD-10-CM

## 2014-09-11 HISTORY — DX: Acute upper respiratory infection, unspecified: J06.9

## 2014-09-11 NOTE — Assessment & Plan Note (Signed)
Cultures negative.  Patient has completed oral antibiotic. Afebrile.  Doing well overall.  Encouraged patient to stay hydrated and eat a well-balanced diet.  Alarm signs/symptoms discussed with patient.

## 2014-09-11 NOTE — Assessment & Plan Note (Signed)
Supportive measures discussed. Encouraged use of OTC cough medications and increased fluids to help calm cough. Humidifier in bedroom.  Follow-up if symptoms are not improving.

## 2014-09-12 ENCOUNTER — Telehealth: Payer: Self-pay | Admitting: *Deleted

## 2014-09-12 ENCOUNTER — Encounter (HOSPITAL_COMMUNITY): Payer: Self-pay | Admitting: Plastic Surgery

## 2014-09-12 NOTE — Telephone Encounter (Signed)
I saw her this past week already for the TCM visit.

## 2014-09-12 NOTE — Telephone Encounter (Signed)
Patient discharged from hospital- admitted for abdominal pain, fevers of unknown origin.  Her d/c instructions state to follow-up with oncology, surgery, GI.  She already has follow-up scheduled with oncology 6/22- no other appointments.  Would you like her to follow-up here as well?

## 2014-09-29 ENCOUNTER — Encounter: Payer: Self-pay | Admitting: Oncology

## 2014-10-03 ENCOUNTER — Encounter: Payer: Self-pay | Admitting: Internal Medicine

## 2014-10-03 ENCOUNTER — Encounter: Payer: Self-pay | Admitting: Physician Assistant

## 2014-10-03 ENCOUNTER — Ambulatory Visit (INDEPENDENT_AMBULATORY_CARE_PROVIDER_SITE_OTHER): Payer: Medicare HMO | Admitting: Physician Assistant

## 2014-10-03 VITALS — BP 114/76 | HR 84 | Ht 67.0 in | Wt 183.1 lb

## 2014-10-03 DIAGNOSIS — R1013 Epigastric pain: Secondary | ICD-10-CM | POA: Diagnosis not present

## 2014-10-03 DIAGNOSIS — R6881 Early satiety: Secondary | ICD-10-CM

## 2014-10-03 DIAGNOSIS — R110 Nausea: Secondary | ICD-10-CM | POA: Diagnosis not present

## 2014-10-03 MED ORDER — PANTOPRAZOLE SODIUM 40 MG PO TBEC: 40.0000 mg | DELAYED_RELEASE_TABLET | Freq: Every day | ORAL | Status: DC

## 2014-10-03 NOTE — Patient Instructions (Addendum)
You have been scheduled for an endoscopy. Please follow written instructions given to you at your visit today. If you use inhalers (even only as needed), please bring them with you on the day of your procedure. Your physician has requested that you go to www.startemmi.com and enter the access code given to you at your visit today. This web site gives a general overview about your procedure. However, you should still follow specific instructions given to you by our office regarding your preparation for the procedure.  You have been scheduled for a gastric emptying scan at [facility] on 10-18-2014 at 7:30 am . Please arrive at 7:15 am prior to your appointment for registration. Please make certain not to have anything to eat or drink after midnight the night before your test. Hold all stomach medications (ex: Zofran, phenergan, Reglan) 48 hours prior to your test. If you need to reschedule your appointment, please contact radiology scheduling at [phone]. _____________________________________________________________________ A gastric-emptying study measures how long it takes for food to move through your stomach. There are several ways to measure stomach emptying. In the most common test, you eat food that contains a small amount of radioactive material. A scanner that detects the movement of the radioactive material is placed over your abdomen to monitor the rate at which food leaves your stomach. This test normally takes about 2 hours to complete. _____________________________________________________________________ We sent a prescription for Pantoprazole sodium 40 mg to [facility]

## 2014-10-03 NOTE — Progress Notes (Signed)
Patient ID: [name], female   DOB: 1946-01-20, 69 y.o.   MRN: [id number]   Subjective:    Patient ID: [name], female    DOB: Mar 11, 1946, 69 y.o.   MRN: [id number]  HPI [name] is a pleasant 69 year old female known previously to Dr. Sharlett Iles with last EGD and colonoscopy in March 2010. Colonoscopy was normal and EGD showed patient to be status post Nissen fundoplication she had an empiric dilation to #54. She requests to establish with Dr. Carlean Purl. She comes in today with complaints of ongoing issues with epigastric discomfort which she describes as an empty Hall low achy feeling over the past few months. She is also had early satiety. She has lost about 7 pounds over the past 3 months. She had been seen in our office in September 2015 and at that time underwent upper GI which showed an intact Nissen fundoplication and had CCK HIDA scan which was abnormal with only 1.5% EF. She was referred to [facility] and was seen by Dr. Kaylyn Lim who patient states told her that she did not need to have gallbladder surgery but should start a probiotic. After that unfortunately she was diagnosed with a recurrent breast cancer and has since undergone a mastectomy and reconstruction of her left breast in March 2016. She was then hospitalized for 3 through 09/01/2014 with Sirs of unclear etiology. All during this time she was having ongoing GI issues. It was decided that perhaps the tissue expander in her breast was causing problems with infection and this was removed on 09/13/2014. She still has a drain in. She says she feels better than she did weeks ago and is still quite weak and having some pain postoperatively.  Review of Systems Pertinent positive and negative review of systems were noted in the above HPI section.  All other review of systems was otherwise negative.  Outpatient Encounter Prescriptions as of 10/03/2014  Medication Sig  . albuterol (PROVENTIL HFA;VENTOLIN HFA) 108 (90 BASE) MCG/ACT inhaler  Inhale 2 puffs into the lungs every 6 (six) hours as needed for wheezing or shortness of breath.  . Cholecalciferol (VITAMIN D3) 1000 UNITS CAPS Take 1 capsule by mouth daily.    . citalopram (CELEXA) 10 MG tablet Take 1 tablet (10 mg total) by mouth daily.  . Cyanocobalamin (VITAMIN B-12 CR) 1000 MCG TBCR Take 1 tablet by mouth daily.    . furosemide (LASIX) 40 MG tablet Take 1 tablet (40 mg total) by mouth daily.  . methocarbamol (ROBAXIN) 500 MG tablet Take 1 tablet (500 mg total) by mouth 4 (four) times daily.  Marland Kitchen OVER THE COUNTER MEDICATION Probiotic 90 billion-Take 1 tablet by mouth daily.  . tamoxifen (NOLVADEX) 20 MG tablet Take 1 tablet (20 mg total) by mouth daily.  . pantoprazole (PROTONIX) 40 MG tablet Take 1 tablet (40 mg total) by mouth daily.  . [DISCONTINUED] docusate sodium (COLACE) 100 MG capsule Take 1 capsule (100 mg total) by mouth daily.  . [DISCONTINUED] doxycycline (VIBRA-TABS) 100 MG tablet Take 1 tablet (100 mg total) by mouth every 12 (twelve) hours.  . [DISCONTINUED] enoxaparin (LOVENOX) 40 MG/0.4ML injection Inject 0.4 mLs (40 mg total) into the skin daily.  . [DISCONTINUED] fluconazole (DIFLUCAN) 100 MG tablet Take 1 tablet (100 mg total) by mouth daily.  . [DISCONTINUED] HYDROmorphone (DILAUDID) 2 MG tablet Take 1-2 tablets (2-4 mg total) by mouth every 4 (four) hours as needed for moderate pain.   No facility-administered encounter medications on file as of 10/03/2014.  Allergies  Allergen Reactions  . Ace Inhibitors     REACTION: angioedema  . Morphine And Related     "crazy"  . Codeine Rash  . Sulfonamide Derivatives Rash  . Telmisartan Rash   Patient Active Problem List   Diagnosis Date Noted  . Viral URI with cough 09/11/2014  . Breast cancer 09/08/2014  . Acute appendicitis 08/31/2014  . SIRS (systemic inflammatory response syndrome) 08/29/2014  . Abscess   . Blood poisoning   . Febrile illness, acute 08/28/2014  . Sepsis 08/28/2014  . Breast  cancer, left breast 08/28/2014  . 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 Genetic testing 06/29/2014  . Family history of breast cancer   . Family history of colon cancer   . Family history of pancreatic cancer   . Skin cancer   . Breast cancer, right breast 05/18/2014  . Breast cancer of upper-outer quadrant of left female breast 05/16/2014  . Pain in the chest 05/16/2014  . RUQ abdominal pain 02/02/2014  . Abdominal pain, epigastric 02/02/2014  . Chronic abdominal pain 11/21/2013  . 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 Peripheral neuropathy 01/01/2013  . Melanoma of upper arm 02/10/2012  . S/P laparoscopic fundoplication 36/64/4034  . Bruising 03/06/2011  . Fatigue 03/06/2011  . LBP (low back pain) 02/06/2011  . Shoulder pain, left 02/06/2011  . Depression 05/22/2010  . FATTY LIVER DISEASE 12/07/2009  . Dysphagia 02/28/2009  . COAGULOPATHY 07/12/2008  . CONSTIPATION 07/12/2008  . ESOPHAGEAL STRICTURE 07/11/2008  . Hiatal hernia 07/11/2008  . Nonspecific (abnormal) findings on radiological and other examination of body structure 01/04/2008  . CHEST XRAY, ABNORMAL 01/04/2008  . Cough 12/07/2007  . ANGIOEDEMA 09/15/2007  . VITAMIN D DEFICIENCY 04/14/2007  . VITAMIN B12 DEFICIENCY 02/27/2007  . Essential hypertension 02/27/2007  . GERD 02/27/2007  . Fibromyalgia 02/27/2007   History   Social History  . Marital Status: Married    Spouse Name: N/A  . Number of Children: 2  . Years of Education: N/A   Occupational History  . Manager    Social History Main Topics  . Smoking status: Never Smoker   . Smokeless tobacco: Never Used     Comment: Regular Exercise - Yes  . Alcohol Use: No  . Drug Use: No  . Sexual Activity: Yes   Other Topics Concern  . Not on file   Social History Narrative    Ms. Gurganus's family history includes Anemia in her paternal grandfather; Breast cancer (age of onset: 79) in her cousin; Breast cancer (age of onset: 15) in her  cousin; Cancer in her paternal uncle; Colon cancer (age of onset: 37) in her cousin; Colon cancer (age of onset: 73) in her cousin; Colon polyps in her brother and father; Dementia in her father; Pancreatic cancer (age of onset: 74) in her brother; Stroke in her other.      Objective:    Filed Vitals:   10/03/14 0952  BP: 114/76  Pulse: 84    Physical Exam well-developed older white female in no acute distress, quite pleasant blood pressure 114/76 pulse 84 height 5 foot 7 weight 183 and HEENT; nontraumatic normocephalic EOMI PERRLA sclera anicteric, Neck ;supple no JVD, Cardiovascular ;regular rate and rhythm with V4-Q5 soft systolic murmur, Pulmonary; clear bilaterally, She does have an incisional scar on the left upper back itches healing, Abdomen; soft bowel sounds are present she's mildly tender in the epigastrium there is no guarding or rebound no palpable mass or hepatosplenomegaly she  has a lower transverse incisional scar from remote TRAM flap, Rectal; exam not done, Ext;is no clubbing cyanosis or edema skin warm and dry, Psych ;mood and affect appropriate       Assessment & Plan:   #1 69 yo female with several month hx of epigastric pain, early satiety , mild weight loss - this is in setting of recurrent breast cancer s/p left mastectomy and reconstruction 07/2014-subsequent hospitalization for infection and a second surgery to remove tissue expander  R/O gastroparesis , gastropathy, vs sxs secondary to bilary dyskinesia #2 s/p Nissen fundoplication 9021 #3 HTN  Plan; gastroparesis diet Start Protonix 40 mg po qam Schedule for EGD with Dr Harold Barban discussed in detail and she is agreeable to proceed Schedule for GE scan If above workup unrevealing needs to go back to Dr . Hassell Done for lap chole   Nyala Kirchner S Carrabelle PA-C 10/03/2014   Cc: Brunetta Jeans, PA-C

## 2014-10-10 ENCOUNTER — Other Ambulatory Visit: Payer: Self-pay | Admitting: Physician Assistant

## 2014-10-18 ENCOUNTER — Telehealth: Payer: Self-pay

## 2014-10-18 ENCOUNTER — Encounter: Payer: Self-pay | Admitting: Internal Medicine

## 2014-10-18 ENCOUNTER — Ambulatory Visit (AMBULATORY_SURGERY_CENTER): Payer: Medicare HMO | Admitting: Internal Medicine

## 2014-10-18 VITALS — BP 160/72 | HR 63 | Temp 98.0°F | Resp 17 | Ht 67.0 in | Wt 183.0 lb

## 2014-10-18 DIAGNOSIS — R110 Nausea: Secondary | ICD-10-CM | POA: Diagnosis present

## 2014-10-18 DIAGNOSIS — K2950 Unspecified chronic gastritis without bleeding: Secondary | ICD-10-CM | POA: Diagnosis not present

## 2014-10-18 DIAGNOSIS — K317 Polyp of stomach and duodenum: Secondary | ICD-10-CM | POA: Diagnosis not present

## 2014-10-18 DIAGNOSIS — R112 Nausea with vomiting, unspecified: Secondary | ICD-10-CM

## 2014-10-18 MED ORDER — SODIUM CHLORIDE 0.9 % IV SOLN: 500.0000 mL | INTRAVENOUS | Status: DC

## 2014-10-18 MED ORDER — VSL#3 PO CAPS: 2.0000 | ORAL_CAPSULE | Freq: Every day | ORAL | Status: DC

## 2014-10-18 NOTE — Patient Instructions (Addendum)
I found a few polyps in your stomach and took biopsies. I doubt it has anything to do with your symptoms.  I recommend you change probiotics to one called VSL # 3 and take 2 capsules daily.  I know it is available at [facility] and the [facility]. i am writing a prescription but it is over the counter. You can use [facility] even if not a member.  We will call with results and plans.  The gastric emptying study will be cancelled.  I appreciate the opportunity to care for you. Gatha Mayer, MD, FACG   YOU HAD AN ENDOSCOPIC PROCEDURE TODAY AT [facility]:   Refer to the procedure report that was given to you for any specific questions about what was found during the examination.  If the procedure report does not answer your questions, please call your gastroenterologist to clarify.  If you requested that your care partner not be given the details of your procedure findings, then the procedure report has been included in a sealed envelope for you to review at your convenience later.  YOU SHOULD EXPECT: Some feelings of bloating in the abdomen. Passage of more gas than usual.  Walking can help get rid of the air that was put into your GI tract during the procedure and reduce the bloating. If you had a lower endoscopy (such as a colonoscopy or flexible sigmoidoscopy) you may notice spotting of blood in your stool or on the toilet paper. If you underwent a bowel prep for your procedure, you may not have a normal bowel movement for a few days.  Please Note:  You might notice some irritation and congestion in your nose or some drainage.  This is from the oxygen used during your procedure.  There is no need for concern and it should clear up in a day or so.  SYMPTOMS TO REPORT IMMEDIATELY:   Following upper endoscopy (EGD)  Vomiting of blood or coffee ground material  New chest pain or pain under the shoulder blades  Painful or persistently  difficult swallowing  New shortness of breath  Fever of 100F or higher  Black, tarry-looking stools  For urgent or emergent issues, a gastroenterologist can be reached at any hour by calling [phone].   DIET: Your first meal following the procedure should be a small meal and then it is ok to progress to your normal diet. Heavy or fried foods are harder to digest and may make you feel nauseous or bloated.  Likewise, meals heavy in dairy and vegetables can increase bloating.  Drink plenty of fluids but you should avoid alcoholic beverages for 24 hours.  ACTIVITY:  You should plan to take it easy for the rest of today and you should NOT DRIVE or use heavy machinery until tomorrow (because of the sedation medicines used during the test).    FOLLOW UP: Our staff will call the number listed on your records the next business day following your procedure to check on you and address any questions or concerns that you may have regarding the information given to you following your procedure. If we do not reach you, we will leave a message.  However, if you are feeling well and you are not experiencing any problems, there is no need to return our call.  We will assume that you have returned to your regular daily activities without incident.  If any biopsies were taken you will be contacted by phone or  by letter within the next 1-3 weeks.  Please call us at [phone] if you have not heard about the biopsies in 3 weeks.    SIGNATURES/CONFIDENTIALITY: You and/or your care partner have signed paperwork which will be entered into your electronic medical record.  These signatures attest to the fact that that the information above on your After Visit Summary has been reviewed and is understood.  Full responsibility of the confidentiality of this discharge information lies with you and/or your care-partner.   Biopsy results pending.

## 2014-10-18 NOTE — Progress Notes (Signed)
Transferred to PACU.  NAD.  Report to Abigail Butts, Therapist, sports.

## 2014-10-18 NOTE — Progress Notes (Signed)
Agree with Ms. Esterwood's assessment and plan. Jammie Clink E. Jabarri Stefanelli, MD, FACG   

## 2014-10-18 NOTE — Op Note (Signed)
[place]  [address] [place], [address]   ENDOSCOPY PROCEDURE REPORT  PATIENT: [name], [name]  MR#: #[id number] BIRTHDATE: 29-Oct-1945 , 68  yrs. old GENDER: female ENDOSCOPIST: Gatha Mayer, MD, [facility] PROCEDURE DATE:  10/18/2014 PROCEDURE:  EGD w/ biopsy ASA CLASS:     Class II INDICATIONS:  epigastric pain and nausea. MEDICATIONS: Propofol 130 mg IV and Monitored anesthesia care TOPICAL ANESTHETIC: none  DESCRIPTION OF PROCEDURE: After the risks benefits and alternatives of the procedure were thoroughly explained, informed consent was obtained.  The LB [id number] endoscope was introduced through the mouth and advanced to the second portion of the duodenum , Without limitations.  The instrument was slowly withdrawn as the mucosa was fully examined.    1) a few antral polyps - diminutive - biopsies taken 2) intact fundoplication - otherwise normal upper endoscopy.  Retroflexed views revealed as previously described.     The scope was then withdrawn from the patient and the procedure completed.  COMPLICATIONS: There were no immediate complications.  ENDOSCOPIC IMPRESSION: 1) a few antral polyps - diminutive - biopsies taken 2) intact fundoplication - otherwise normal upper endoscopy  RECOMMENDATIONS: 1.  Await pathology results 2.  Office will call with results 3.  Cancel GES - she is not vomiting much if at all - has a soreness and gnawing epigastric pain with some pressure.  Not like biliary colic.  Cause of sxs unclear but she has been on numerous antibiotics lately so ? disruption of bacterial flora.  Chnage probiotioc to VSL #3.    eSigned:  Gatha Mayer, MD, [facility] 10/18/2014 8:31 AM    CC: Kaylyn Lim, MD, The Patient, Elyn Aquas, PA-C

## 2014-10-18 NOTE — Telephone Encounter (Signed)
-----   Message from Gatha Mayer, MD sent at 10/18/2014  8:14 AM EDT ----- Regarding: cancel GES Please cancel GES on 5/26

## 2014-10-18 NOTE — Progress Notes (Signed)
Called to room to assist during endoscopic procedure.  Patient ID and intended procedure confirmed with present staff. Received instructions for my participation in the procedure from the performing physician.  

## 2014-10-18 NOTE — Telephone Encounter (Signed)
appt cancelled with Peggy from radiology scheduling

## 2014-10-19 ENCOUNTER — Telehealth: Payer: Self-pay | Admitting: *Deleted

## 2014-10-19 NOTE — Telephone Encounter (Signed)
  Follow up Call-  Call back number 10/18/2014  Post procedure Call Back phone  # [phone]  Permission to leave phone message Yes     Patient questions:  Do you have a fever, pain , or abdominal swelling? No. Pain Score  0 *  Have you tolerated food without any problems? Yes.    Have you been able to return to your normal activities? Yes.    Do you have any questions about your discharge instructions: Diet   No. Medications  No. Follow up visit  No.  Do you have questions or concerns about your Care? No.  Actions: * If pain score is 4 or above: No action needed, pain <4.

## 2014-10-20 ENCOUNTER — Ambulatory Visit (HOSPITAL_COMMUNITY): Payer: Medicare HMO

## 2014-10-26 NOTE — Progress Notes (Signed)
Quick Note:  biopsies show benign polyp and some inflammation (non-specific) She is trying VSL # 3 probiotic Please stay on that and see me in about 1 month please Let me know if she feels she is worse at this time and how  [place] - no letter or recall  ______

## 2014-11-08 ENCOUNTER — Other Ambulatory Visit: Payer: Self-pay | Admitting: *Deleted

## 2014-11-08 DIAGNOSIS — C50412 Malignant neoplasm of upper-outer quadrant of left female breast: Secondary | ICD-10-CM

## 2014-11-09 ENCOUNTER — Other Ambulatory Visit (HOSPITAL_BASED_OUTPATIENT_CLINIC_OR_DEPARTMENT_OTHER): Payer: Medicare HMO

## 2014-11-09 DIAGNOSIS — C50412 Malignant neoplasm of upper-outer quadrant of left female breast: Secondary | ICD-10-CM | POA: Diagnosis not present

## 2014-11-09 LAB — CBC WITH DIFFERENTIAL/PLATELET
BASO%: 0.6 % (ref 0.0–2.0)
BASOS ABS: 0 10*3/uL (ref 0.0–0.1)
EOS ABS: 0.3 10*3/uL (ref 0.0–0.5)
EOS%: 4 % (ref 0.0–7.0)
HCT: 38.4 % (ref 34.8–46.6)
HEMOGLOBIN: 12.9 g/dL (ref 11.6–15.9)
LYMPH%: 29.8 % (ref 14.0–49.7)
MCH: 31 pg (ref 25.1–34.0)
MCHC: 33.6 g/dL (ref 31.5–36.0)
MCV: 92.2 fL (ref 79.5–101.0)
MONO#: 0.5 10*3/uL (ref 0.1–0.9)
MONO%: 7.5 % (ref 0.0–14.0)
NEUT%: 58.1 % (ref 38.4–76.8)
NEUTROS ABS: 3.7 10*3/uL (ref 1.5–6.5)
PLATELETS: 154 10*3/uL (ref 145–400)
RBC: 4.17 10*6/uL (ref 3.70–5.45)
RDW: 13.5 % (ref 11.2–14.5)
WBC: 6.4 10*3/uL (ref 3.9–10.3)
lymph#: 1.9 10*3/uL (ref 0.9–3.3)

## 2014-11-09 LAB — COMPREHENSIVE METABOLIC PANEL (CC13)
ALT: 48 U/L (ref 0–55)
AST: 40 U/L — ABNORMAL HIGH (ref 5–34)
Albumin: 3.5 g/dL (ref 3.5–5.0)
Alkaline Phosphatase: 54 U/L (ref 40–150)
Anion Gap: 6 mEq/L (ref 3–11)
BILIRUBIN TOTAL: 0.32 mg/dL (ref 0.20–1.20)
BUN: 14.2 mg/dL (ref 7.0–26.0)
CO2: 28 meq/L (ref 22–29)
Calcium: 8.7 mg/dL (ref 8.4–10.4)
Chloride: 110 mEq/L — ABNORMAL HIGH (ref 98–109)
Creatinine: 1.1 mg/dL (ref 0.6–1.1)
EGFR: 53 mL/min/{1.73_m2} — AB (ref >90)
GLUCOSE: 86 mg/dL (ref 70–140)
Potassium: 4.1 mEq/L (ref 3.5–5.1)
SODIUM: 144 meq/L (ref 136–145)
Total Protein: 6.2 g/dL — ABNORMAL LOW (ref 6.4–8.3)

## 2014-11-16 ENCOUNTER — Ambulatory Visit (HOSPITAL_BASED_OUTPATIENT_CLINIC_OR_DEPARTMENT_OTHER): Payer: Medicare HMO | Admitting: Oncology

## 2014-11-16 ENCOUNTER — Telehealth: Payer: Self-pay | Admitting: Oncology

## 2014-11-16 VITALS — BP 142/79 | HR 79 | Temp 99.9°F | Resp 18 | Ht 67.0 in | Wt 181.7 lb

## 2014-11-16 DIAGNOSIS — C50412 Malignant neoplasm of upper-outer quadrant of left female breast: Secondary | ICD-10-CM

## 2014-11-16 DIAGNOSIS — Z79811 Long term (current) use of aromatase inhibitors: Secondary | ICD-10-CM

## 2014-11-16 DIAGNOSIS — Z170 Estrogen receptor positive status [ER+]: Secondary | ICD-10-CM | POA: Diagnosis not present

## 2014-11-16 DIAGNOSIS — Z853 Personal history of malignant neoplasm of breast: Secondary | ICD-10-CM | POA: Diagnosis not present

## 2014-11-16 NOTE — Telephone Encounter (Signed)
Appointments made and avs printed for patient °

## 2014-11-16 NOTE — Progress Notes (Signed)
[name]  [phone] [phone]     ID: [name] DOB: [birth year]  MR#: [id number]  QXI#:[id number]  Patient Care Team: Brunetta Jeans, PA-C as PCP - General (Physician Assistant) Sable Feil, MD as Attending Physician (Gastroenterology) Josue Hector, MD (Cardiology) Cheri Fowler, MD as Attending Physician (Obstetrics and Gynecology) Chauncey Cruel, MD (Hematology and Oncology) PCP: [name] Rio, PA-C GYN: SU: [name] Age MD OTHER MD: [name] M.D. , Nena Polio M.D., Silvano Rusk M.D.  CHIEF COMPLAINT: Bilateral breast cancers  CURRENT TREATMENT: Tamoxifen    Breast cancer of upper-outer quadrant of left female breast   05/09/2014 Initial Diagnosis Left breast  upper-outer quadrant biopsy shows an invasive breast cancer with lobular features, grade 1, estrogen receptor 100% positive, progesterone receptor 100% positive, with an MIB-1 of 8% and no HER-2 amplification    HISTORY OF BREAST CANCER From the original intake note:  [name], now [name], was my patient in 1996, at which time she had an early stage breast cancer treated with mastectomy with TRAM flap reconstruction followed by CMF chemotherapy 8. She did not receive radiation or anti-estrogens. She was released from follow-up early this sensory.  More recently [name] had routine left screening mammography with tomography at the breast Center 04/27/2014. Breast density was category B. A possible mass in the left breast was noted and on 05/09/2014 she underwent diagnostic left mammography and ultrasonography showing a 9 mm spiculated asymmetry in the left breast upper outer quadrant. This was not palpable. Ultrasound identified a 1.0 cm irregular hypoechoic lesion in that area. Ultrasound of the left axilla was negative.  Biopsy of the left breast mass 05/09/2014 showed ([id number]) an invasive breast cancer with lobular features, grade 1. The  tumor was estrogen receptor 100% positive, progesterone receptor 99% positive, both with strong staining intensity. The MIB-1 was 8%. HER-2 was not amplified, the signals ratio being 1.10 and the number per cell 1.70.  On 05/17/2014 the patient underwent bilateral breast MRI. This showed breast composition category C. In the upper outer quadrant of the left breast there was stippled non-masslike enhancement measuring 3.9 cm. Within this region there was an area of slightly increased consolidation corresponding to the smaller mass noted on ultrasonography. There were no abnormal appearing lymph nodes in the right breast was unremarkable.  [name]'s case was presented at the breast cancer multidisciplinary conference 05/18/2014. The patient is a candidate for breast conservation, and if she opts for that her surgeon may wish to do a generous lumpectomy or proceed to biopsy of the more extensive area of stippled non-masslike enhancement. It was felt that since the patient had a mastectomy on the right she might choose a mastectomy on the left. An Oncotype test was also recommended.  [name] was evaluated at the breast clinic 05/18/2014. Her subsequent history is as detailed below  [name] returns today for follow-up of her breast cancer. After her surgery and reconstruction she has been on tamoxifen. She generally tolerates this well. She has some minimal hot flashes, and very minimal by [name]. She obtains it at a good price.   REVIEW OF SYSTEMS: Center has not fully recovered from her earlier surgeries. She still very fatigued. One day she will walk half a mile, and feel tired, the next a she just won't be able to do it. She has achy joints. She has a little bit of flushing but no fever. She is having significant  abdominal problems which Dr. Sherlynn Stalls is working on. She thinks the new probiotic is helping some. She feels forgetful. She is not anxious or depressed. She is not mulling over  things. She is just "blank". A detailed review of systems today was otherwise stable  Allergies  Allergen Reactions  . Ace Inhibitors     REACTION: angioedema  . Morphine And Related     "crazy"  . Codeine Rash  . Sulfonamide Derivatives Rash  . Telmisartan Rash    Current Outpatient Prescriptions  Medication Sig Dispense Refill  . albuterol (PROVENTIL HFA;VENTOLIN HFA) 108 (90 BASE) MCG/ACT inhaler Inhale 2 puffs into the lungs every 6 (six) hours as needed for wheezing or shortness of breath. 1 Inhaler 0  . Cholecalciferol (VITAMIN D3) 1000 UNITS CAPS Take 1 capsule by mouth daily.      . citalopram (CELEXA) 10 MG tablet Take 1 tablet (10 mg total) by mouth daily. 30 tablet 1  . Cyanocobalamin (VITAMIN B-12 CR) 1000 MCG TBCR Take 1 tablet by mouth daily.      . furosemide (LASIX) 40 MG tablet Take 1 tablet (40 mg total) by mouth daily. 90 tablet 0  . OVER THE COUNTER MEDICATION Probiotic 90 billion-Take 1 tablet by mouth daily.    . pantoprazole (PROTONIX) 40 MG tablet Take 1 tablet (40 mg total) by mouth daily. 90 tablet 3  . Probiotic Product (VSL#3) CAPS Take 2 capsules by mouth daily. 60 capsule 11  . tamoxifen (NOLVADEX) 20 MG tablet Take 1 tablet (20 mg total) by mouth daily. 90 tablet 4  . [DISCONTINUED] sucralfate (CARAFATE) 1 GM/10ML suspension Take 1 g by mouth at bedtime as needed.       No current facility-administered medications for this visit.    PAST MEDICAL HISTORY: Past Medical History  Diagnosis Date  . GERD (gastroesophageal reflux disease)   . Depression   . Vitamin D deficiency   . Vitamin B12 deficiency   . Lichen sclerosus   . Family history of breast cancer   . Family history of colon cancer   . Family history of pancreatic cancer   . Dyspnea     Normal Spirometry 03/2008 EF 65% BNP normal 11/2007  . Blood dyscrasia     bruises and bleed easily  . History of hiatal hernia   . Complication of anesthesia     went to sleep easily but hard to wake  up up until elbow OR in 2010  . Arthritis     "spine" (07/28/2014)  . Chronic back pain     "all over"  . Mild anxiety   . Breast cancer, right breast 1995  . Melanoma 2010    "right elbow; treated at [facility]"  . Breast cancer, left breast 07/28/2014  . Gastric polyps     PAST SURGICAL HISTORY: Past Surgical History  Procedure Laterality Date  . Temporomandibular joint surgery Bilateral 1987  . Nissen fundoplication  19/4174  . Melanoma excision Right 2010    From elbow-- Done at [facility]   . Tonsillectomy    . Colonoscopy      Dr Sharlett Iles  . Reconstruction breast immediate / delayed w/ tissue expander Left 07/28/2014  . Hernia repair    . Breast biopsy Left 04/2014  . Mastectomy Right 1996     chemotherapy. pt. states 13 lymph nodes were removed  . Mastectomy complete / simple w/ sentinel node biopsy Left 07/28/2014  . Bunionectomy Bilateral 1970's  . Mastectomy w/ sentinel node biopsy  Left 07/28/2014    Procedure: LEFT MASTECTOMY WITH SENTINEL LYMPH NODE MAPPING;  Surgeon: [name] Messing III, MD;  Location: [place];  Service: General;  Laterality: Left;  . Breast reconstruction with placement of tissue expander and flex hd (acellular hydrated dermis) Left 07/28/2014    Procedure: LEFT BREAST RECONSTRUCTION PLACEMENT OF LEFT TISSUE EXPANDER ;  Surgeon: [name] Reese, MD;  Location: [place];  Service: Plastics;  Laterality: Left;  . Breast reconstruction with placement of tissue expander and flex hd (acellular hydrated dermis) Left 09/08/2014    Procedure: REMOVAL OF TISSUE EXPANDER FROM LEFT BREAST;  Surgeon: [name] Reese, MD;  Location: [place];  Service: Plastics;  Laterality: Left;  . Latissimus flap to breast Left 09/08/2014    Procedure: LEFT LATISSIMUS FLAP TO BREAST WITH SALINE IMPLANT FOR BREAST RECONSTRUCTION;  Surgeon: [name] Reese, MD;  Location: [place];  Service: Plastics;  Laterality: Left;  . Esophagogastroduodenoscopy      FAMILY HISTORY Family History  Problem Relation [name] of Onset  .  Stroke Other     F 1st degree relative 34, M 1st degree relative  . Colon cancer Cousin 43    double first cousin  . Colon cancer Cousin 107    double first cousin  . Colon polyps Father     between 26-20  . Dementia Father   . Pancreatic cancer Brother 48  . Colon polyps Brother     between 10-20  . Cancer Paternal Uncle     NOS  . Anemia Paternal Grandfather     pernicious anemia  . Breast cancer Cousin 76    double first cousin  . Breast cancer Cousin 26    paternal cousin   the patient's father died at [name] 28 in the setting of Alzheimer's disease. The patient's mother died at [name] 23 following a stroke. Jamarie has 3 brothers, no sisters. Malia's niece, [name], was my patient with a history of breast cancer diagnosed at [name] 59. She succumbed to that tumor. The patient also has 2 paternal cousins with breast cancer diagnosed at [name] 48 and [name] 23. One brother has prostate cancer diagnosed at [name] 45. The other brother has a history of throat cancer diagnosed at [name] 6. There is no other history of breast or ovarian cancer in the family to her knowledge.  GENETICS TESTING:  MUTYH mutation, normal BRCA genes  GYNECOLOGIC HISTORY:  No LMP recorded. Patient is postmenopausal. Menarche [name] 48, first live birth [name] 66. She is GX P2. She went through menopause in 1996 at the time of her chemotherapy. She did not take hormone replacement. 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 Her husband of 3 years, [name], is a Gaffer at [employer]. He matches colors for a [employer]. Nyx has 2 children from a prior marriage, [name], 69 years old, lives in [place] and is a homemaker; and [name], 84, who lives in [place] and works as a Freight forwarder. Benn has 4 biological grandchildren. [name] has a son, [name], who lives in [place] and worse at  [employer]. [name] has 4 grandchildren of his. The patient is a Psychologist, forensic.    ADVANCED DIRECTIVES: In place; currently Kaitland's children [name] and [name] are co- healthcare powers of attorney. [name] can be reached at [phone]   HEALTH MAINTENANCE: History  Substance Use Topics  . Smoking status: Never Smoker   .  Smokeless tobacco: Never Used     Comment: Regular Exercise - Yes  . Alcohol Use: No     Colonoscopy:  PAP: 2013  Bone density: Remote  Lipid panel:    OBJECTIVE: Middle-aged white woman who appears stated [name] 30 Vitals:   11/16/14 1416  BP: 142/79  Pulse: 79  Temp: 99.9 F (37.7 C)  Resp: 18     Body mass index is 28.45 kg/(m^2).    ECOG FS:1 - Symptomatic but completely ambulatory  Sclerae unicteric, EOMs intact Oropharynx clear, dentition in good repair, cheeks slightly flushed, no rosacea No cervical or supraclavicular adenopathy Lungs no rales or rhonchi Heart regular rate and rhythm Abd soft, nontender, positive bowel sounds MSK no focal spinal tenderness, no upper extremity lymphedema Neuro: nonfocal, well oriented, appropriate affect Breast: The right breast is status post remote mastectomy with TRAM reconstruction. There is no evidence of local recurrence. The right axilla is benign per the left breast status post more recent mastectomy again with TRAM reconstruction. There is no evidence of local recurrence to the left axilla is benign.   LAB RESULTS:  CMP     Component Value Date/Time   NA 144 11/09/2014 1432   NA 143 09/01/2014 0407   K 4.1 11/09/2014 1432   K 3.7 09/01/2014 0407   CL 113* 09/01/2014 0407   CO2 28 11/09/2014 1432   CO2 22 09/01/2014 0407   GLUCOSE 86 11/09/2014 1432   GLUCOSE 100* 09/01/2014 0407   BUN 14.2 11/09/2014 1432   BUN 7 09/01/2014 0407   CREATININE 1.1 11/09/2014 1432   CREATININE 0.82 09/01/2014 0407   CREATININE 0.85 11/11/2013 1105   CALCIUM 8.7 11/09/2014 1432   CALCIUM 7.9*  09/01/2014 0407   PROT 6.2* 11/09/2014 1432   PROT 4.9* 08/29/2014 0458   ALBUMIN 3.5 11/09/2014 1432   ALBUMIN 2.7* 08/29/2014 0458   AST 40* 11/09/2014 1432   AST 18 08/29/2014 0458   ALT 48 11/09/2014 1432   ALT 16 08/29/2014 0458   ALKPHOS 54 11/09/2014 1432   ALKPHOS 40 08/29/2014 0458   BILITOT 0.32 11/09/2014 1432   BILITOT 0.8 08/29/2014 0458   GFRNONAA 72* 09/01/2014 0407   GFRAA 83* 09/01/2014 0407    INo results found for: SPEP, UPEP  Lab Results  Component Value Date   WBC 6.4 11/09/2014   NEUTROABS 3.7 11/09/2014   HGB 12.9 11/09/2014   HCT 38.4 11/09/2014   MCV 92.2 11/09/2014   PLT 154 11/09/2014      Chemistry      Component Value Date/Time   NA 144 11/09/2014 1432   NA 143 09/01/2014 0407   K 4.1 11/09/2014 1432   K 3.7 09/01/2014 0407   CL 113* 09/01/2014 0407   CO2 28 11/09/2014 1432   CO2 22 09/01/2014 0407   BUN 14.2 11/09/2014 1432   BUN 7 09/01/2014 0407   CREATININE 1.1 11/09/2014 1432   CREATININE 0.82 09/01/2014 0407   CREATININE 0.85 11/11/2013 1105      Component Value Date/Time   CALCIUM 8.7 11/09/2014 1432   CALCIUM 7.9* 09/01/2014 0407   ALKPHOS 54 11/09/2014 1432   ALKPHOS 40 08/29/2014 0458   AST 40* 11/09/2014 1432   AST 18 08/29/2014 0458   ALT 48 11/09/2014 1432   ALT 16 08/29/2014 0458   BILITOT 0.32 11/09/2014 1432   BILITOT 0.8 08/29/2014 0458       No results found for: LABCA2  No components found for: [id number]  No  results for input(s): INR in the last 168 hours.  Urinalysis    Component Value Date/Time   COLORURINE YELLOW 08/28/2014 1438   APPEARANCEUR CLEAR 08/28/2014 1438   LABSPEC 1.018 08/28/2014 1438   PHURINE 8.0 08/28/2014 1438   GLUCOSEU NEGATIVE 08/28/2014 1438   GLUCOSEU NEGATIVE 02/06/2011 0845   HGBUR NEGATIVE 08/28/2014 1438   BILIRUBINUR NEGATIVE 08/28/2014 1438   BILIRUBINUR small 11/11/2013 0847   KETONESUR 15* 08/28/2014 1438   PROTEINUR NEGATIVE 08/28/2014 1438   PROTEINUR 30+  11/11/2013 0847   UROBILINOGEN 1.0 08/28/2014 1438   UROBILINOGEN 0.2 11/11/2013 0847   NITRITE NEGATIVE 08/28/2014 1438   NITRITE neg 11/11/2013 0847   LEUKOCYTESUR NEGATIVE 08/28/2014 1438    RADIOLOGY AND OTHER STUDIES: CLINICAL DATA: Left breast cancer with recent mastectomy. Fever. Nausea and vomiting, query abscess. Drain in place.  EXAM: CT CHEST WITH CONTRAST  TECHNIQUE: Multidetector CT imaging of the chest was performed during intravenous contrast administration.  CONTRAST: 64m OMNIPAQUE IOHEXOL 300 MG/ML SOLN  COMPARISON: 03/15/2014  FINDINGS: Mediastinum/Nodes: No pathologic thoracic adenopathy. Bilateral axillary clips.  Lungs/Pleura: Trace bilateral pleural effusions, left greater than right. Mild biapical pleural parenchymal scarring, right greater than left.  Left upper lobe subpleural lymph node on image 20 of series 5, 0.5 by 0.3 cm, not changed 11/25/2007 and considered benign. 4 mm left lower lobe nodule, image 38 series 5, no change from 11/25/2007. Additional slight left basilar nodularity likewise stable.  Upper abdomen: Diffuse steatosis of the visualized liver. 5 mm right kidney upper pole nonobstructive calculus. Postoperative findings along the gastroesophageal junction.  Musculoskeletal: Thoracic spondylosis.  Left breast expander between the pectoralis muscles. A drain extends along the medial margin of this expander and just superficial to the medial margin of the pectoralis musculature. No abscess identified.  IMPRESSION: 1. No abscess in the vicinity of the drain or tissue expander. 2. Trace bilateral pleural effusions, left greater than right. 3. Small left-sided pulmonary nodules are stable from 2009 and considered benign. 4. Diffuse hepatic steatosis. 5. 5 mm right kidney upper pole nonobstructive calculus.   Electronically Signed  By: WVan ClinesM.D.  On: 08/29/2014 12:58       ASSESSMENT:  69y.o. [place] woman status post left breast biopsy 05/09/2014 for a clinical T1-T2, N0 (stage I-II) invasive lobular breast cancer, estrogen receptor 100% positive, progesterone receptor 99% positive, with an MIB-1 of 8%, and no HER-2 amplification.  (1) history of right modified radical mastectomy with TRAM reconstruction 1996, followed by chemotherapy for 6 months; did not receive antiestrogen therapy or radiation therapy adjuvantly  (2) tamoxifen started neoadjuvantly 05/18/2014  (3) status post left mastectomy and sentinel lymph node sampling 07/28/2014 for a pT1c pN0(i+). Stage IA invasive ductal carcinoma, grade 1, with repeat HER-2 again negative. Margins were ample  (4) Oncotype score of 3 predicts an outside the breast risk of recurrence within 10 years of 4% if the patient's only systemic treatment is tamoxifen for 5 years. It also predicts no significant benefit from chemotherapy.  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 Sequencing and rearrangement analysis for the following 32 genes found no deleterious mutations: APC, ATM, BARD1, BMPR1A, BRCA1, BRCA2, BRIP1, CDH1, CDK4, CDKN2A, CHEK2, EPCAM, GREM1, MLH1, MRE11A, MSH2, MSH6, MUTYH, NBN, NF1, PALB2, PMS2, POLD1, POLE, PTEN, RAD50, RAD51D, SMAD4, SMARCA4, STK11, and TP53.  (a) Heterozygous MUTYH mutations are not  associated with an increased risk for cancer in the patient, however, other family members may be at risk for homozygous mutations and therefore family testing should be considered.   PLAN: Brandice is generally doing well with no evidence of active cancer. She is tolerating the tamoxifen with minimal side effects, and the plan will be to continue that for 5-10 years.  I do think the fatigue she is experiencing may be in line with his lower than usual recovery, on the  other hand it could also be due to other problems were not picking up. I am going to get an ANA and 80*with her next set of labs I just to make sure were not dealing with an autoimmune problem which May BE from making her feel this way. In the meantime I have encouraged her to participate in the [place] program. She will see if she is able to accomplish that.  She will see [place] again in 3 months. She knows to call for any problems that may develop before that visit.  Chauncey Cruel, MD   11/16/2014 2:39 PM Medical Oncology and Hematology [facility] [address] [place], [place] [address] Tel. [phone]    Fax. [phone]

## 2014-11-18 ENCOUNTER — Telehealth: Payer: Self-pay | Admitting: Oncology

## 2014-11-18 ENCOUNTER — Telehealth: Payer: Self-pay

## 2014-11-18 NOTE — Telephone Encounter (Signed)
s.w. pt and advised on SEpt appt cx and moved to Aug...Marland Kitchenper Terri sched on 8.30 @ 12:30pm with MD

## 2014-11-18 NOTE — Telephone Encounter (Signed)
Returned call to patient re: hair thinning from tamoxifen.  Per Dr. Jana Hakim, pt can hold tamoxifen for 2 months and schedule follow up with him.  Pt should hear from scheduling with new d/t.  Pt voiced understanding.    pof entered.

## 2014-11-30 ENCOUNTER — Ambulatory Visit (INDEPENDENT_AMBULATORY_CARE_PROVIDER_SITE_OTHER): Payer: Medicare HMO | Admitting: Internal Medicine

## 2014-11-30 ENCOUNTER — Encounter: Payer: Self-pay | Admitting: Internal Medicine

## 2014-11-30 VITALS — BP 124/80 | HR 72 | Ht 67.0 in | Wt 182.4 lb

## 2014-11-30 DIAGNOSIS — G8929 Other chronic pain: Secondary | ICD-10-CM | POA: Diagnosis not present

## 2014-11-30 DIAGNOSIS — M797 Fibromyalgia: Secondary | ICD-10-CM

## 2014-11-30 DIAGNOSIS — R109 Unspecified abdominal pain: Secondary | ICD-10-CM

## 2014-11-30 NOTE — Assessment & Plan Note (Signed)
Reassured Observe Stay on probx See me prn

## 2014-11-30 NOTE — Progress Notes (Signed)
   Subjective:    Patient ID: [name], female    DOB: 1945/08/13, 69 y.o.   MRN: [id number] Cc: f/u abdominal pain HPI She reports slight less epigastric abdominal pain. Having slight RLQ pain x2 weeks. Thinks VSL#3 helped slightly. No constipation or diarrhea. She has spells of feeling weak all over and sweats. [facility] at Dr. Virgie Dad recommendation. Stopped tamoxifen recently also.  Medications, allergies, past medical history, past surgical history, family history and social history are reviewed and updated in the EMR.  Review of Systems As above hospitalized 2 x after breat reconstruction surgery due to infections/SIRS/Sepsis in April    Objective:   Physical Exam @BP  124/80 mmHg  Pulse 72  Ht 5\' 7"  (1.702 m)  Wt 182 lb 6 oz (82.725 kg)  BMI 28.56 kg/m2@  General:  NAD Eyes:   anicteric Lungs:  clear Heart:: S1S2 no rubs, murmurs or gallops Abdomen:  soft and nontender, BS+ Ext:   no edema, cyanosis or clubbing    Data Reviewed:  Hospital notes 2016 Labs in EMR EGD (negative) 2016  Wt Readings from Last 3 Encounters:  11/30/14 182 lb 6 oz (82.725 kg)  11/16/14 181 lb 11.2 oz (82.419 kg)  10/18/14 183 lb (83.008 kg)       Assessment & Plan:  Chronic abdominal pain Reassured Observe Stay on probx See me prn  Fibromyalgia Probably part of current pain issues

## 2014-11-30 NOTE — Assessment & Plan Note (Signed)
Probably part of current pain issues

## 2014-11-30 NOTE — Patient Instructions (Signed)
   I hope with more time these symptoms will improve. If you have more intense pains, fevers, significant bowel changes call or message me.  I appreciate the opportunity to care for you.  Gatha Mayer, MD, [facility]

## 2014-12-23 ENCOUNTER — Ambulatory Visit (INDEPENDENT_AMBULATORY_CARE_PROVIDER_SITE_OTHER): Payer: Medicare HMO | Admitting: Physician Assistant

## 2014-12-23 ENCOUNTER — Encounter: Payer: Self-pay | Admitting: Physician Assistant

## 2014-12-23 VITALS — BP 130/78 | HR 87 | Temp 98.0°F | Ht 67.0 in | Wt 185.2 lb

## 2014-12-23 DIAGNOSIS — G8929 Other chronic pain: Secondary | ICD-10-CM

## 2014-12-23 DIAGNOSIS — R1031 Right lower quadrant pain: Secondary | ICD-10-CM

## 2014-12-23 DIAGNOSIS — F32A Depression, unspecified: Secondary | ICD-10-CM

## 2014-12-23 DIAGNOSIS — R319 Hematuria, unspecified: Secondary | ICD-10-CM

## 2014-12-23 DIAGNOSIS — F329 Major depressive disorder, single episode, unspecified: Secondary | ICD-10-CM

## 2014-12-23 LAB — POCT URINALYSIS DIPSTICK
Bilirubin, UA: NEGATIVE
GLUCOSE UA: NEGATIVE
Leukocytes, UA: NEGATIVE
NITRITE UA: NEGATIVE
PH UA: 6
Urobilinogen, UA: 1

## 2014-12-23 MED ORDER — FLUOXETINE HCL 10 MG PO TABS: 10.0000 mg | ORAL_TABLET | Freq: Every day | ORAL | Status: DC

## 2014-12-23 MED ORDER — TRAMADOL HCL 50 MG PO TABS
50.0000 mg | ORAL_TABLET | Freq: Three times a day (TID) | ORAL | Status: DC | PRN
Start: 2014-12-23 — End: 2015-04-06

## 2014-12-23 NOTE — Patient Instructions (Signed)
Please go to the lab for blood work. I will call you with your results. You will be contacted to schedule your CT scan.   Please take Tramadol for moderate-severe pain.  If anything acutely worsens, please go to the ER.  For ears, apply head and shoulder shampoo and rinse thoroughly. Apply moisturizing lotion to the ears.  Stop the citalopram and restart your Fluoxetine daily Follow-up 1 month.

## 2014-12-23 NOTE — Progress Notes (Signed)
Pre visit review using our clinic review tool, if applicable. No additional management support is needed unless otherwise documented below in the visit note. 

## 2014-12-24 DIAGNOSIS — R1031 Right lower quadrant pain: Principal | ICD-10-CM

## 2014-12-24 DIAGNOSIS — G8929 Other chronic pain: Secondary | ICD-10-CM | POA: Insufficient documentation

## 2014-12-24 LAB — CBC WITH DIFFERENTIAL/PLATELET
BASOS PCT: 1 % (ref 0–1)
Basophils Absolute: 0.1 10*3/uL (ref 0.0–0.1)
EOS ABS: 0.2 10*3/uL (ref 0.0–0.7)
Eosinophils Relative: 3 % (ref 0–5)
HCT: 37.8 % (ref 36.0–46.0)
Hemoglobin: 12.8 g/dL (ref 12.0–15.0)
Lymphocytes Relative: 32 % (ref 12–46)
Lymphs Abs: 2 10*3/uL (ref 0.7–4.0)
MCH: 31.2 pg (ref 26.0–34.0)
MCHC: 33.9 g/dL (ref 30.0–36.0)
MCV: 92.2 fL (ref 78.0–100.0)
MPV: 10.7 fL (ref 8.6–12.4)
Monocytes Absolute: 0.5 10*3/uL (ref 0.1–1.0)
Monocytes Relative: 8 % (ref 3–12)
NEUTROS PCT: 56 % (ref 43–77)
Neutro Abs: 3.6 10*3/uL (ref 1.7–7.7)
Platelets: 190 10*3/uL (ref 150–400)
RBC: 4.1 MIL/uL (ref 3.87–5.11)
RDW: 13.7 % (ref 11.5–15.5)
WBC: 6.4 10*3/uL (ref 4.0–10.5)

## 2014-12-24 LAB — URINALYSIS, MICROSCOPIC ONLY
BACTERIA UA: NONE SEEN [HPF]
CRYSTALS: NONE SEEN [HPF]
Casts: NONE SEEN [LPF]
Yeast: NONE SEEN [HPF]

## 2014-12-24 LAB — COMPREHENSIVE METABOLIC PANEL
ALT: 39 U/L — ABNORMAL HIGH (ref 6–29)
AST: 33 U/L (ref 10–35)
Albumin: 3.9 g/dL (ref 3.6–5.1)
Alkaline Phosphatase: 51 U/L (ref 33–130)
BUN: 15 mg/dL (ref 7–25)
CO2: 28 mmol/L (ref 20–31)
CREATININE: 0.93 mg/dL (ref 0.50–0.99)
Calcium: 8.8 mg/dL (ref 8.6–10.4)
Chloride: 108 mmol/L (ref 98–110)
Glucose, Bld: 87 mg/dL (ref 65–99)
POTASSIUM: 3.3 mmol/L — AB (ref 3.5–5.3)
SODIUM: 144 mmol/L (ref 135–146)
Total Bilirubin: 0.3 mg/dL (ref 0.2–1.2)
Total Protein: 6 g/dL — ABNORMAL LOW (ref 6.1–8.1)

## 2014-12-24 LAB — CULTURE, URINE COMPREHENSIVE
COLONY COUNT: NO GROWTH
ORGANISM ID, BACTERIA: NO GROWTH

## 2014-12-24 NOTE — Assessment & Plan Note (Signed)
Citalopram sub-therapeutic. Will discontinue and restart Fluoxetine. Follow-up 1 month.

## 2014-12-24 NOTE — Progress Notes (Signed)
Patient presents to clinic today to discuss discontinuing her Celexa. Endorses no improvement with medication. Endorses continue depressed mood and anhedonia. Is now off of Tamoxifen and would like to restart her Fluoxetine as she feels it worked much better. Denies SI/HI>  Patient also complains of intermittent R side pain over the past 3-4 months that feels deep and aching. Denies nausea or vomiting. Endorses good bowel output. Denies urinary urgency, frequency, hematuria.  Notes urine has been darker over the past few days and feels she may have blood in her urine. Denies vaginal bleeding, melena or hematochezia. Has not taken anything for symptoms.  Past Medical History  Diagnosis Date  . GERD (gastroesophageal reflux disease)   . Depression   . Vitamin D deficiency   . Vitamin B12 deficiency   . Lichen sclerosus   . Family history of breast cancer   . Family history of colon cancer   . Family history of pancreatic cancer   . Dyspnea     Normal Spirometry 03/2008 EF 65% BNP normal 11/2007  . Blood dyscrasia     bruises and bleed easily  . History of hiatal hernia   . Complication of anesthesia     went to sleep easily but hard to wake up up until elbow OR in 2010  . Arthritis     "spine" (07/28/2014)  . Chronic back pain     "all over"  . Mild anxiety   . Breast cancer, right breast 1995  . Melanoma 2010    "right elbow; treated at [facility]"  . Breast cancer, left breast 07/28/2014  . Gastric polyps     Current Outpatient Prescriptions on File Prior to Visit  Medication Sig Dispense Refill  . Cholecalciferol (VITAMIN D3) 1000 UNITS CAPS Take 1 capsule by mouth daily.      . Cyanocobalamin (VITAMIN B-12 CR) 1000 MCG TBCR Take 1 tablet by mouth daily.      . furosemide (LASIX) 40 MG tablet Take 1 tablet (40 mg total) by mouth daily. 90 tablet 0  . OVER THE COUNTER MEDICATION Probiotic 90 billion-Take 1 tablet by mouth daily.    . [DISCONTINUED] sucralfate (CARAFATE) 1 GM/10ML  suspension Take 1 g by mouth at bedtime as needed.       No current facility-administered medications on file prior to visit.    Allergies  Allergen Reactions  . Ace Inhibitors     REACTION: angioedema  . Morphine And Related     "crazy"  . Codeine Rash  . Sulfonamide Derivatives Rash  . Telmisartan Rash    Family History  Problem Relation Age of Onset  . Stroke Other     F 1st degree relative 38, M 1st degree relative  . Colon cancer Cousin 70    double first cousin  . Colon cancer Cousin 90    double first cousin  . Colon polyps Father     between 77-20  . Dementia Father   . Pancreatic cancer Brother 50  . Colon polyps Brother     between 10-20  . Cancer Paternal Uncle     NOS  . Anemia Paternal Grandfather     pernicious anemia  . Breast cancer Cousin 60    double first cousin  . Breast cancer Cousin 74    paternal cousin    History   Social History  . Marital Status: Married    Spouse Name: N/A  . Number of Children: 2  . Years of  Education: N/A   Occupational History  . Manager    Social History Main Topics  . Smoking status: Never Smoker   . Smokeless tobacco: Never Used     Comment: Regular Exercise - Yes  . Alcohol Use: No  . Drug Use: No  . Sexual Activity: Yes   Other Topics Concern  . None   Social History Narrative    Review of Systems - See HPI.  All other ROS are negative.  BP 130/78 mmHg  Pulse 87  Temp(Src) 98 F (36.7 C) (Oral)  Ht 5' 7"  (1.702 m)  Wt 185 lb 3.2 oz (84.006 kg)  BMI 29.00 kg/m2  SpO2 98%  Physical Exam  Constitutional: She is oriented to person, place, and time and well-developed, well-nourished, and in no distress.  HENT:  Head: Normocephalic and atraumatic.  Eyes: Conjunctivae are normal.  Neck: Neck supple.  Cardiovascular: Normal rate, regular rhythm, normal heart sounds and intact distal pulses.   Pulmonary/Chest: Effort normal and breath sounds normal. No respiratory distress. She has no  wheezes. She has no rales. She exhibits no tenderness.  Abdominal: Soft. Bowel sounds are normal. She exhibits no distension and no mass. There is no tenderness. There is no rebound and no guarding.  Neurological: She is alert and oriented to person, place, and time.  Skin: Skin is warm and dry. No rash noted.  Psychiatric: Her mood appears not anxious. She exhibits a depressed mood. She expresses no homicidal and no suicidal ideation. She has a flat affect.  Vitals reviewed.   Recent Results (from the past 2160 hour(s))  CBC with Differential     Status: None   Collection Time: 11/09/14  2:32 PM  Result Value Ref Range   WBC 6.4 3.9 - 10.3 10e3/uL   NEUT# 3.7 1.5 - 6.5 10e3/uL   HGB 12.9 11.6 - 15.9 g/dL   HCT 38.4 34.8 - 46.6 %   Platelets 154 145 - 400 10e3/uL   MCV 92.2 79.5 - 101.0 fL   MCH 31.0 25.1 - 34.0 pg   MCHC 33.6 31.5 - 36.0 g/dL   RBC 4.17 3.70 - 5.45 10e6/uL   RDW 13.5 11.2 - 14.5 %   lymph# 1.9 0.9 - 3.3 10e3/uL   MONO# 0.5 0.1 - 0.9 10e3/uL   Eosinophils Absolute 0.3 0.0 - 0.5 10e3/uL   Basophils Absolute 0.0 0.0 - 0.1 10e3/uL   NEUT% 58.1 38.4 - 76.8 %   LYMPH% 29.8 14.0 - 49.7 %   MONO% 7.5 0.0 - 14.0 %   EOS% 4.0 0.0 - 7.0 %   BASO% 0.6 0.0 - 2.0 %  Comprehensive metabolic panel (Cmet) - [facility]     Status: Abnormal   Collection Time: 11/09/14  2:32 PM  Result Value Ref Range   Sodium 144 136 - 145 mEq/L   Potassium 4.1 3.5 - 5.1 mEq/L   Chloride 110 (H) 98 - 109 mEq/L   CO2 28 22 - 29 mEq/L   Glucose 86 70 - 140 mg/dl   BUN 14.2 7.0 - 26.0 mg/dL   Creatinine 1.1 0.6 - 1.1 mg/dL   Total Bilirubin 0.32 0.20 - 1.20 mg/dL   Alkaline Phosphatase 54 40 - 150 U/L   AST 40 (H) 5 - 34 U/L   ALT 48 0 - 55 U/L   Total Protein 6.2 (L) 6.4 - 8.3 g/dL   Albumin 3.5 3.5 - 5.0 g/dL   Calcium 8.7 8.4 - 10.4 mg/dL   Anion Gap 6  3 - 11 mEq/L   EGFR 53 (L) >90 ml/min/1.73 m2    Comment: eGFR is calculated using the CKD-EPI Creatinine Equation (2009)  Comp Met (CMET)      Status: Abnormal   Collection Time: 12/23/14  4:16 PM  Result Value Ref Range   Sodium 144 135 - 146 mmol/L   Potassium 3.3 (L) 3.5 - 5.3 mmol/L   Chloride 108 98 - 110 mmol/L   CO2 28 20 - 31 mmol/L   Glucose, Bld 87 65 - 99 mg/dL   BUN 15 7 - 25 mg/dL   Creat 0.93 0.50 - 0.99 mg/dL   Total Bilirubin 0.3 0.2 - 1.2 mg/dL   Alkaline Phosphatase 51 33 - 130 U/L   AST 33 10 - 35 U/L   ALT 39 (H) 6 - 29 U/L   Total Protein 6.0 (L) 6.1 - 8.1 g/dL   Albumin 3.9 3.6 - 5.1 g/dL   Calcium 8.8 8.6 - 10.4 mg/dL    Comment: ** Please note change in unit of measure and reference range(s). **     CBC w/Diff     Status: None   Collection Time: 12/23/14  4:16 PM  Result Value Ref Range   WBC 6.4 4.0 - 10.5 K/uL   RBC 4.10 3.87 - 5.11 MIL/uL   Hemoglobin 12.8 12.0 - 15.0 g/dL   HCT 37.8 36.0 - 46.0 %   MCV 92.2 78.0 - 100.0 fL   MCH 31.2 26.0 - 34.0 pg   MCHC 33.9 30.0 - 36.0 g/dL   RDW 13.7 11.5 - 15.5 %   Platelets 190 150 - 400 K/uL   MPV 10.7 8.6 - 12.4 fL   Neutrophils Relative % 56 43 - 77 %   Neutro Abs 3.6 1.7 - 7.7 K/uL   Lymphocytes Relative 32 12 - 46 %   Lymphs Abs 2.0 0.7 - 4.0 K/uL   Monocytes Relative 8 3 - 12 %   Monocytes Absolute 0.5 0.1 - 1.0 K/uL   Eosinophils Relative 3 0 - 5 %   Eosinophils Absolute 0.2 0.0 - 0.7 K/uL   Basophils Relative 1 0 - 1 %   Basophils Absolute 0.1 0.0 - 0.1 K/uL   Smear Review Criteria for review not met   Urine Microscopic Only     Status: Abnormal   Collection Time: 12/23/14  4:16 PM  Result Value Ref Range   WBC, UA 6-10 (A) <=5 WBC/HPF   RBC / HPF >60 (A) <=2 RBC/HPF   Squamous Epithelial / LPF 0-5 <=5 HPF   Bacteria, UA NONE SEEN NONE SEEN HPF   Crystals NONE SEEN NONE SEEN HPF   Casts NONE SEEN NONE SEEN LPF   Yeast NONE SEEN NONE SEEN HPF  POCT urinalysis dipstick     Status: None   Collection Time: 12/23/14  5:34 PM  Result Value Ref Range   Color, UA dark-yellow    Clarity, UA cloudy    Glucose, UA neg     Bilirubin, UA neg    Ketones, UA trace    Spec Grav, UA >=1.030    Blood, UA 3+    pH, UA 6.0    Protein, UA 2+    Urobilinogen, UA 1.0    Nitrite, UA neg    Leukocytes, UA Negative Negative    Assessment/Plan: Chronic RLQ pain With hematuria noted on dip. Will send urine for micro and culture. Will obtain CBC and CMP. Will also obtain CT Abdomen to further  assess cause of this chronic pain in patient with hx of breat cancer. Will give short course of Tramadol as patient allergic to codeine and cannot tolerate NSAIDs. Previously tolerated Tramadol. Alarm signs/symptoms discussed.  Depression Citalopram sub-therapeutic. Will discontinue and restart Fluoxetine. Follow-up 1 month.

## 2014-12-24 NOTE — Assessment & Plan Note (Signed)
With hematuria noted on dip. Will send urine for micro and culture. Will obtain CBC and CMP. Will also obtain CT Abdomen to further assess cause of this chronic pain in patient with hx of breat cancer. Will give short course of Tramadol as patient allergic to codeine and cannot tolerate NSAIDs. Previously tolerated Tramadol. Alarm signs/symptoms discussed.

## 2014-12-26 ENCOUNTER — Ambulatory Visit (HOSPITAL_BASED_OUTPATIENT_CLINIC_OR_DEPARTMENT_OTHER)
Admission: RE | Admit: 2014-12-26 | Discharge: 2014-12-26 | Disposition: A | Discharge status: Home / Self Care | Payer: Medicare HMO | Source: Ambulatory Visit | Attending: Physician Assistant | Admitting: Physician Assistant

## 2014-12-26 DIAGNOSIS — K760 Fatty (change of) liver, not elsewhere classified: Secondary | ICD-10-CM | POA: Diagnosis not present

## 2014-12-26 DIAGNOSIS — N200 Calculus of kidney: Secondary | ICD-10-CM | POA: Insufficient documentation

## 2014-12-26 DIAGNOSIS — M47895 Other spondylosis, thoracolumbar region: Secondary | ICD-10-CM | POA: Diagnosis not present

## 2014-12-26 DIAGNOSIS — R1031 Right lower quadrant pain: Secondary | ICD-10-CM | POA: Diagnosis present

## 2014-12-26 DIAGNOSIS — G8929 Other chronic pain: Secondary | ICD-10-CM

## 2014-12-26 DIAGNOSIS — R319 Hematuria, unspecified: Secondary | ICD-10-CM

## 2014-12-27 NOTE — Progress Notes (Signed)
Notified pt. 

## 2015-01-04 ENCOUNTER — Ambulatory Visit (INDEPENDENT_AMBULATORY_CARE_PROVIDER_SITE_OTHER): Payer: Medicare HMO | Admitting: Physician Assistant

## 2015-01-04 ENCOUNTER — Encounter: Payer: Self-pay | Admitting: Physician Assistant

## 2015-01-04 VITALS — BP 118/90 | HR 74 | Temp 98.4°F | Ht 67.0 in | Wt 183.4 lb

## 2015-01-04 DIAGNOSIS — R109 Unspecified abdominal pain: Secondary | ICD-10-CM | POA: Diagnosis not present

## 2015-01-04 DIAGNOSIS — R310 Gross hematuria: Secondary | ICD-10-CM

## 2015-01-04 NOTE — Patient Instructions (Signed)
Please continue pain medication as directed. Stay well hydrated. You will be contacted by Urology for assessment.

## 2015-01-04 NOTE — Progress Notes (Signed)
Pre visit review using our clinic review tool, if applicable. No additional management support is needed unless otherwise documented below in the visit note. 

## 2015-01-08 DIAGNOSIS — R109 Unspecified abdominal pain: Secondary | ICD-10-CM | POA: Insufficient documentation

## 2015-01-08 NOTE — Assessment & Plan Note (Signed)
With gross hematuria intermittently. CT with nonobstructive stones only. Will refer to Urology for further assessment giving gross hematuria and hx of cancer. Pain management reviewed. Continue Tramadol as directed. ER if anything acutely worsens.

## 2015-01-08 NOTE — Progress Notes (Signed)
Patient presents to clinic today c/o continued R flank pain. CT at last visit revealed non obstructive stones in lower pole of R kidney but no other cause of symptoms. Urine + hematuria in this breast cancer patient. Patient endorses pain improved but still comes and goes. Denies new symptoms. Still denies fever, chills. Endorses normal urination and stools.  Past Medical History  Diagnosis Date  . GERD (gastroesophageal reflux disease)   . Depression   . Vitamin D deficiency   . Vitamin B12 deficiency   . Lichen sclerosus   . Family history of breast cancer   . Family history of colon cancer   . Family history of pancreatic cancer   . Dyspnea     Normal Spirometry 03/2008 EF 65% BNP normal 11/2007  . Blood dyscrasia     bruises and bleed easily  . History of hiatal hernia   . Complication of anesthesia     went to sleep easily but hard to wake up up until elbow OR in 2010  . Arthritis     "spine" (07/28/2014)  . Chronic back pain     "all over"  . Mild anxiety   . Breast cancer, right breast 1995  . Melanoma 2010    "right elbow; treated at [facility]"  . Breast cancer, left breast 07/28/2014  . Gastric polyps     Current Outpatient Prescriptions on File Prior to Visit  Medication Sig Dispense Refill  . Cholecalciferol (VITAMIN D3) 1000 UNITS CAPS Take 1 capsule by mouth daily.      . Cyanocobalamin (VITAMIN B-12 CR) 1000 MCG TBCR Take 1 tablet by mouth daily.      Marland Kitchen FLUoxetine (PROZAC) 10 MG tablet Take 1 tablet (10 mg total) by mouth daily. 30 tablet 3  . furosemide (LASIX) 40 MG tablet Take 1 tablet (40 mg total) by mouth daily. 90 tablet 0  . OVER THE COUNTER MEDICATION Probiotic 90 billion-Take 1 tablet by mouth daily.    . traMADol (ULTRAM) 50 MG tablet Take 1 tablet (50 mg total) by mouth every 8 (eight) hours as needed. 30 tablet 0  . [DISCONTINUED] sucralfate (CARAFATE) 1 GM/10ML suspension Take 1 g by mouth at bedtime as needed.       No current facility-administered  medications on file prior to visit.    Allergies  Allergen Reactions  . Ace Inhibitors     REACTION: angioedema  . Morphine And Related     "crazy"  . Codeine Rash  . Sulfonamide Derivatives Rash  . Telmisartan Rash    Family History  Problem Relation Age of Onset  . Stroke Other     F 1st degree relative 11, M 1st degree relative  . Colon cancer Cousin 44    double first cousin  . Colon cancer Cousin 64    double first cousin  . Colon polyps Father     between 42-20  . Dementia Father   . Pancreatic cancer Brother 71  . Colon polyps Brother     between 10-20  . Cancer Paternal Uncle     NOS  . Anemia Paternal Grandfather     pernicious anemia  . Breast cancer Cousin 57    double first cousin  . Breast cancer Cousin 74    paternal cousin    Social History   Social History  . Marital Status: Married    Spouse Name: N/A  . Number of Children: 2  . Years of Education: N/A  Occupational History  . Manager    Social History Main Topics  . Smoking status: Never Smoker   . Smokeless tobacco: Never Used     Comment: Regular Exercise - Yes  . Alcohol Use: No  . Drug Use: No  . Sexual Activity: Yes   Other Topics Concern  . None   Social History Narrative    Review of Systems - See HPI.  All other ROS are negative.  BP 118/90 mmHg  Pulse 74  Temp(Src) 98.4 F (36.9 C) (Oral)  Ht _0  (1.702 m)  Wt 183 lb 6.4 oz (83.19 kg)  BMI 28.72 kg/m2  SpO2 98%  Physical Exam  Constitutional: She is oriented to person, place, and time and well-developed, well-nourished, and in no distress.  HENT:  Head: Normocephalic and atraumatic.  Eyes: Conjunctivae are normal. Pupils are equal, round, and reactive to light.  Neck: Neck supple.  Cardiovascular: Normal rate, regular rhythm, normal heart sounds and intact distal pulses.   Pulmonary/Chest: Effort normal and breath sounds normal. No respiratory distress. She has no wheezes. She has no rales. She exhibits no  tenderness.  Abdominal: Soft. Bowel sounds are normal. She exhibits no distension and no mass. There is no tenderness. There is no rebound and no guarding.  Neurological: She is alert and oriented to person, place, and time.  Skin: Skin is warm and dry. No rash noted.  Psychiatric: Affect normal.  Vitals reviewed.   Recent Results (from the past 2160 hour(s))  CBC with Differential     Status: None   Collection Time: 11/09/14  2:32 PM  Result Value Ref Range   WBC 6.4 3.9 - 10.3 10e3/uL   NEUT# 3.7 1.5 - 6.5 10e3/uL   HGB 12.9 11.6 - 15.9 g/dL   HCT 38.4 34.8 - 46.6 %   Platelets 154 145 - 400 10e3/uL   MCV 92.2 79.5 - 101.0 fL   MCH 31.0 25.1 - 34.0 pg   MCHC 33.6 31.5 - 36.0 g/dL   RBC 4.17 3.70 - 5.45 10e6/uL   RDW 13.5 11.2 - 14.5 %   lymph# 1.9 0.9 - 3.3 10e3/uL   MONO# 0.5 0.1 - 0.9 10e3/uL   Eosinophils Absolute 0.3 0.0 - 0.5 10e3/uL   Basophils Absolute 0.0 0.0 - 0.1 10e3/uL   NEUT% 58.1 38.4 - 76.8 %   LYMPH% 29.8 14.0 - 49.7 %   MONO% 7.5 0.0 - 14.0 %   EOS% 4.0 0.0 - 7.0 %   BASO% 0.6 0.0 - 2.0 %  Comprehensive metabolic panel (Cmet) - [facility]     Status: Abnormal   Collection Time: 11/09/14  2:32 PM  Result Value Ref Range   Sodium 144 136 - 145 mEq/L   Potassium 4.1 3.5 - 5.1 mEq/L   Chloride 110 (H) 98 - 109 mEq/L   CO2 28 22 - 29 mEq/L   Glucose 86 70 - 140 mg/dl   BUN 14.2 7.0 - 26.0 mg/dL   Creatinine 1.1 0.6 - 1.1 mg/dL   Total Bilirubin 0.32 0.20 - 1.20 mg/dL   Alkaline Phosphatase 54 40 - 150 U/L   AST 40 (H) 5 - 34 U/L   ALT 48 0 - 55 U/L   Total Protein 6.2 (L) 6.4 - 8.3 g/dL   Albumin 3.5 3.5 - 5.0 g/dL   Calcium 8.7 8.4 - 10.4 mg/dL   Anion Gap 6 3 - 11 mEq/L   EGFR 53 (L) >90 ml/min/1.73 m2    Comment: eGFR  is calculated using the CKD-EPI Creatinine Equation (2009)  Comp Met (CMET)     Status: Abnormal   Collection Time: 12/23/14  4:16 PM  Result Value Ref Range   Sodium 144 135 - 146 mmol/L   Potassium 3.3 (L) 3.5 - 5.3 mmol/L   Chloride  108 98 - 110 mmol/L   CO2 28 20 - 31 mmol/L   Glucose, Bld 87 65 - 99 mg/dL   BUN 15 7 - 25 mg/dL   Creat 0.93 0.50 - 0.99 mg/dL   Total Bilirubin 0.3 0.2 - 1.2 mg/dL   Alkaline Phosphatase 51 33 - 130 U/L   AST 33 10 - 35 U/L   ALT 39 (H) 6 - 29 U/L   Total Protein 6.0 (L) 6.1 - 8.1 g/dL   Albumin 3.9 3.6 - 5.1 g/dL   Calcium 8.8 8.6 - 10.4 mg/dL    Comment: ** Please note change in unit of measure and reference range(s). **     CBC w/Diff     Status: None   Collection Time: 12/23/14  4:16 PM  Result Value Ref Range   WBC 6.4 4.0 - 10.5 K/uL   RBC 4.10 3.87 - 5.11 MIL/uL   Hemoglobin 12.8 12.0 - 15.0 g/dL   HCT 37.8 36.0 - 46.0 %   MCV 92.2 78.0 - 100.0 fL   MCH 31.2 26.0 - 34.0 pg   MCHC 33.9 30.0 - 36.0 g/dL   RDW 13.7 11.5 - 15.5 %   Platelets 190 150 - 400 K/uL   MPV 10.7 8.6 - 12.4 fL   Neutrophils Relative % 56 43 - 77 %   Neutro Abs 3.6 1.7 - 7.7 K/uL   Lymphocytes Relative 32 12 - 46 %   Lymphs Abs 2.0 0.7 - 4.0 K/uL   Monocytes Relative 8 3 - 12 %   Monocytes Absolute 0.5 0.1 - 1.0 K/uL   Eosinophils Relative 3 0 - 5 %   Eosinophils Absolute 0.2 0.0 - 0.7 K/uL   Basophils Relative 1 0 - 1 %   Basophils Absolute 0.1 0.0 - 0.1 K/uL   Smear Review Criteria for review not met   CULTURE, URINE COMPREHENSIVE     Status: None   Collection Time: 12/23/14  4:16 PM  Result Value Ref Range   Colony Count NO GROWTH    Organism ID, Bacteria NO GROWTH   Urine Microscopic Only     Status: Abnormal   Collection Time: 12/23/14  4:16 PM  Result Value Ref Range   WBC, UA 6-10 (A) <=5 WBC/HPF   RBC / HPF >60 (A) <=2 RBC/HPF   Squamous Epithelial / LPF 0-5 <=5 HPF   Bacteria, UA NONE SEEN NONE SEEN HPF   Crystals NONE SEEN NONE SEEN HPF   Casts NONE SEEN NONE SEEN LPF   Yeast NONE SEEN NONE SEEN HPF  POCT urinalysis dipstick     Status: None   Collection Time: 12/23/14  5:34 PM  Result Value Ref Range   Color, UA dark-yellow    Clarity, UA cloudy    Glucose, UA neg     Bilirubin, UA neg    Ketones, UA trace    Spec Grav, UA >=1.030    Blood, UA 3+    pH, UA 6.0    Protein, UA 2+    Urobilinogen, UA 1.0    Nitrite, UA neg    Leukocytes, UA Negative Negative    Assessment/Plan: Right flank pain With gross hematuria  intermittently. CT with nonobstructive stones only. Will refer to Urology for further assessment giving gross hematuria and hx of cancer. Pain management reviewed. Continue Tramadol as directed. ER if anything acutely worsens.

## 2015-01-24 ENCOUNTER — Other Ambulatory Visit (HOSPITAL_BASED_OUTPATIENT_CLINIC_OR_DEPARTMENT_OTHER): Payer: Medicare HMO

## 2015-01-24 ENCOUNTER — Telehealth: Payer: Self-pay | Admitting: Oncology

## 2015-01-24 ENCOUNTER — Ambulatory Visit (HOSPITAL_BASED_OUTPATIENT_CLINIC_OR_DEPARTMENT_OTHER): Payer: Medicare HMO | Admitting: Oncology

## 2015-01-24 VITALS — BP 132/61 | HR 82 | Temp 98.7°F | Resp 18 | Ht 67.0 in | Wt 185.7 lb

## 2015-01-24 DIAGNOSIS — Z853 Personal history of malignant neoplasm of breast: Secondary | ICD-10-CM

## 2015-01-24 DIAGNOSIS — C50412 Malignant neoplasm of upper-outer quadrant of left female breast: Secondary | ICD-10-CM

## 2015-01-24 DIAGNOSIS — C50911 Malignant neoplasm of unspecified site of right female breast: Secondary | ICD-10-CM

## 2015-01-24 LAB — COMPREHENSIVE METABOLIC PANEL (CC13)
ALBUMIN: 3.7 g/dL (ref 3.5–5.0)
ALK PHOS: 69 U/L (ref 40–150)
ALT: 41 U/L (ref 0–55)
AST: 34 U/L (ref 5–34)
Anion Gap: 9 mEq/L (ref 3–11)
BUN: 15 mg/dL (ref 7.0–26.0)
CALCIUM: 9.4 mg/dL (ref 8.4–10.4)
CHLORIDE: 107 meq/L (ref 98–109)
CO2: 30 mEq/L — ABNORMAL HIGH (ref 22–29)
Creatinine: 1.1 mg/dL (ref 0.6–1.1)
EGFR: 52 mL/min/{1.73_m2} — ABNORMAL LOW (ref >90)
Glucose: 117 mg/dl (ref 70–140)
Potassium: 3.9 mEq/L (ref 3.5–5.1)
Sodium: 146 mEq/L — ABNORMAL HIGH (ref 136–145)
TOTAL PROTEIN: 6.5 g/dL (ref 6.4–8.3)
Total Bilirubin: 0.34 mg/dL (ref 0.20–1.20)

## 2015-01-24 LAB — CBC WITH DIFFERENTIAL/PLATELET
BASO%: 0.5 % (ref 0.0–2.0)
Basophils Absolute: 0 10*3/uL (ref 0.0–0.1)
EOS%: 5 % (ref 0.0–7.0)
Eosinophils Absolute: 0.4 10*3/uL (ref 0.0–0.5)
HEMATOCRIT: 40 % (ref 34.8–46.6)
HEMOGLOBIN: 13.6 g/dL (ref 11.6–15.9)
LYMPH#: 1.7 10*3/uL (ref 0.9–3.3)
LYMPH%: 23.4 % (ref 14.0–49.7)
MCH: 31.5 pg (ref 25.1–34.0)
MCHC: 34 g/dL (ref 31.5–36.0)
MCV: 92.6 fL (ref 79.5–101.0)
MONO#: 0.6 10*3/uL (ref 0.1–0.9)
MONO%: 7.8 % (ref 0.0–14.0)
NEUT%: 63.3 % (ref 38.4–76.8)
NEUTROS ABS: 4.7 10*3/uL (ref 1.5–6.5)
Platelets: 167 10*3/uL (ref 145–400)
RBC: 4.32 10*6/uL (ref 3.70–5.45)
RDW: 13.4 % (ref 11.2–14.5)
WBC: 7.4 10*3/uL (ref 3.9–10.3)

## 2015-01-24 MED ORDER — TAMOXIFEN CITRATE 20 MG PO TABS
20.0000 mg | ORAL_TABLET | Freq: Every day | ORAL | Status: AC
Start: 2015-01-24 — End: 2015-02-23

## 2015-01-24 NOTE — Telephone Encounter (Signed)
Appointments made and avs printed for patient °

## 2015-01-24 NOTE — Progress Notes (Signed)
[place]  Telephone:(336) [phone] Fax:(336) [phone]     ID: [name] DOB: 1945-07-12  MR#: [id number]  KGU#:[id number]  Patient Care Team: Brunetta Jeans, PA-C as PCP - General (Physician Assistant) Sable Feil, MD as Attending Physician (Gastroenterology) Josue Hector, MD (Cardiology) Cheri Fowler, MD as Attending Physician (Obstetrics and Gynecology) Chauncey Cruel, MD (Hematology and Oncology) PCP: Leeanne Rio, PA-C GYN: SU: Star Age MD OTHER MD: Crissie Reese M.D. , Nena Polio M.D., Silvano Rusk M.D., Stevphen Rochester MD, Sherren Mocha McDiarmid MD  CHIEF COMPLAINT: Bilateral breast cancers  CURRENT TREATMENT: Tamoxifen    Breast cancer of upper-outer quadrant of left female breast   05/09/2014 Initial Diagnosis Left breast  upper-outer quadrant biopsy shows an invasive breast cancer with lobular features, grade 1, estrogen receptor 100% positive, progesterone receptor 100% positive, with an MIB-1 of 8% and no HER-2 amplification    Breast cancer   09/08/2014 Initial Diagnosis Breast cancer    HISTORY OF BREAST CANCER From the original intake note:  [name], now [name], was my patient in 1996, at which time she had an early stage breast cancer treated with mastectomy with TRAM flap reconstruction followed by CMF chemotherapy 8. She did not receive radiation or anti-estrogens. She was released from follow-up early this sensory.  More recently [name] had routine left screening mammography with tomography at the breast Center 04/27/2014. Breast density was category B. A possible mass in the left breast was noted and on 05/09/2014 she underwent diagnostic left mammography and ultrasonography showing a 9 mm spiculated asymmetry in the left breast upper outer quadrant. This was not palpable. Ultrasound identified a 1.0 cm irregular hypoechoic lesion in that area. Ultrasound of the left axilla was negative.  Biopsy of the left  breast mass 05/09/2014 showed ([id number]) an invasive breast cancer with lobular features, grade 1. The tumor was estrogen receptor 100% positive, progesterone receptor 99% positive, both with strong staining intensity. The MIB-1 was 8%. HER-2 was not amplified, the signals ratio being 1.10 and the number per cell 1.70.  On 05/17/2014 the patient underwent bilateral breast MRI. This showed breast composition category C. In the upper outer quadrant of the left breast there was stippled non-masslike enhancement measuring 3.9 cm. Within this region there was an area of slightly increased consolidation corresponding to the smaller mass noted on ultrasonography. There were no abnormal appearing lymph nodes in the right breast was unremarkable.  [name]'s case was presented at the breast cancer multidisciplinary conference 05/18/2014. The patient is a candidate for breast conservation, and if she opts for that her surgeon may wish to do a generous lumpectomy or proceed to biopsy of the more extensive area of stippled non-masslike enhancement. It was felt that since the patient had a mastectomy on the right she might choose a mastectomy on the left. An Oncotype test was also recommended.  [name] was evaluated at the breast clinic 05/18/2014. Her subsequent history is as detailed below  [name] returns today for follow-up of her breast cancer. She had gone off tamoxifen about 2 months ago because she was losing her hair.Shefindsthatsheisnolongerlosingherhair,atleastnotasrapidly and she has more energy. Otherwise she has not noted any change since going off that medication  She had been told by pharmacy that there was an interaction between fluoxetine and tamoxifen so she was taken off the fluoxetine and put on Cymbalta. That did not work very well for her. Since she went off the tamoxifen she  is now back on the fluoxetine and tolerating that well.  She is very dissatisfied regarding her  reconstruction. The left latissimus flap tends to pull the implant sideways so at night it flops over to the side and wakes her up. She has to prop it up towards the middle, which feels better. She has experimented with several breast but none of them so the problem. She has discussed this with Dr. Harlow Mares but from her account he has done all he can to get that to work for her. She would like a second opinion regarding this.  She has also been found to have blood in her urine. She does have a history of kidney stones. She has been scheduled to see Dr. Tamsen Roers within the next 2 weeks   REVIEW OF SYSTEMS: Aside from the problems above, a detailed review of systems today was noncontributory.  Allergies  Allergen Reactions  . Ace Inhibitors     REACTION: angioedema  . Morphine And Related     "crazy"  . Codeine Rash  . Sulfonamide Derivatives Rash  . Telmisartan Rash    Current Outpatient Prescriptions  Medication Sig Dispense Refill  . Cholecalciferol (VITAMIN D3) 1000 UNITS CAPS Take 1 capsule by mouth daily.      . Cyanocobalamin (VITAMIN B-12 CR) 1000 MCG TBCR Take 1 tablet by mouth daily.      Marland Kitchen FLUoxetine (PROZAC) 10 MG tablet Take 1 tablet (10 mg total) by mouth daily. 30 tablet 3  . furosemide (LASIX) 40 MG tablet Take 1 tablet (40 mg total) by mouth daily. 90 tablet 0  . OVER THE COUNTER MEDICATION Probiotic 90 billion-Take 1 tablet by mouth daily.    . traMADol (ULTRAM) 50 MG tablet Take 1 tablet (50 mg total) by mouth every 8 (eight) hours as needed. 30 tablet 0  . [DISCONTINUED] sucralfate (CARAFATE) 1 GM/10ML suspension Take 1 g by mouth at bedtime as needed.       No current facility-administered medications for this visit.    PAST MEDICAL HISTORY: Past Medical History  Diagnosis Date  . GERD (gastroesophageal reflux disease)   . Depression   . Vitamin D deficiency   . Vitamin B12 deficiency   . Lichen sclerosus   . Family history of breast cancer   . Family  history of colon cancer   . Family history of pancreatic cancer   . Dyspnea     Normal Spirometry 03/2008 EF 65% BNP normal 11/2007  . Blood dyscrasia     bruises and bleed easily  . History of hiatal hernia   . Complication of anesthesia     went to sleep easily but hard to wake up up until elbow OR in 2010  . Arthritis     "spine" (07/28/2014)  . Chronic back pain     "all over"  . Mild anxiety   . Breast cancer, right breast 1995  . Melanoma 2010    "right elbow; [facility] at [facility]"  . Breast cancer, left breast 07/28/2014  . Gastric polyps     PAST SURGICAL HISTORY: Past Surgical History  Procedure Laterality Date  . Temporomandibular joint surgery Bilateral 1987  . Nissen fundoplication  44/9675  . Melanoma excision Right 2010    From elbow-- Done [facility]   . Tonsillectomy    . Colonoscopy      Dr Sharlett Iles  . Reconstruction breast immediate / delayed w/ tissue expander Left 07/28/2014  . Hernia repair    . Breast  biopsy Left 04/2014  . Mastectomy Right 1996     chemotherapy. pt. states 13 lymph nodes were removed  . Mastectomy complete / simple w/ sentinel node biopsy Left 07/28/2014  . Bunionectomy Bilateral 1970's  . Mastectomy w/ sentinel node biopsy Left 07/28/2014    Procedure: LEFT MASTECTOMY WITH SENTINEL LYMPH NODE MAPPING;  Surgeon: Autumn Messing III, MD;  Location: [place];  Service: General;  Laterality: Left;  . Breast reconstruction with placement of tissue expander and flex hd (acellular hydrated dermis) Left 07/28/2014    Procedure: LEFT BREAST RECONSTRUCTION PLACEMENT OF LEFT TISSUE EXPANDER ;  Surgeon: Crissie Reese, MD;  Location: [place];  Service: Plastics;  Laterality: Left;  . Breast reconstruction with placement of tissue expander and flex hd (acellular hydrated dermis) Left 09/08/2014    Procedure: REMOVAL OF TISSUE EXPANDER FROM LEFT BREAST;  Surgeon: Crissie Reese, MD;  Location: [place];  Service: Plastics;  Laterality: Left;  . Latissimus flap to breast Left 09/08/2014     Procedure: LEFT LATISSIMUS FLAP TO BREAST WITH SALINE IMPLANT FOR BREAST RECONSTRUCTION;  Surgeon: Crissie Reese, MD;  Location: [place];  Service: Plastics;  Laterality: Left;  . Esophagogastroduodenoscopy      FAMILY HISTORY Family History  Problem Relation Age of Onset  . Stroke Other     F 1st degree relative 59, M 1st degree relative  . Colon cancer Cousin 51    double first cousin  . Colon cancer Cousin 48    double first cousin  . Colon polyps Father     between 68-20  . Dementia Father   . Pancreatic cancer Brother 76  . Colon polyps Brother     between 10-20  . Cancer Paternal Uncle     NOS  . Anemia Paternal Grandfather     pernicious anemia  . Breast cancer Cousin 6    double first cousin  . Breast cancer Cousin 75    paternal cousin   the patient's father died at age 39 in the setting of Alzheimer's disease. The patient's mother died at age 23 following a stroke. Melaina has 3 brothers, no sisters. Ikeya's niece, Kela Millin, was my patient with a history of breast cancer diagnosed at age 83. She succumbed to that tumor. The patient also has 2 paternal cousins with breast cancer diagnosed at age 37 and age 65. One brother has prostate cancer diagnosed at age 4. The other brother has a history of throat cancer diagnosed at age 55. There is no other history of breast or ovarian cancer in the family to her knowledge.  GENETICS TESTING:  MUTYH mutation, normal BRCA genes  GYNECOLOGIC HISTORY:  No LMP recorded. Patient is postmenopausal. Menarche age 49, first live birth age 80. She is GX P2. She went through menopause in 1996 at the time of her chemotherapy. She did not take hormone replacement. 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 Her husband of 3 years, Marlou Sa, is a Gaffer at [employer]. He matches colors for a [employer].  Hillari has 2 children from a prior marriage, Becky Sax, 69 years old, lives in [place] and is a homemaker; and Corene Cornea, 71, who lives in [place] and works as a Freight forwarder. Pacifico has 4 biological grandchildren. Marlou Sa has a son, Mitzi Hansen, who lives in [place] and worse at [employer]. Marlou Sa has 4 grandchildren of his. The  patient is a Psychologist, forensic.    ADVANCED DIRECTIVES: In place; currently Dawnielle's children Becky Sax and Corene Cornea are co- healthcare powers of attorney. Corene Cornea can be reached at [place]: Social History  Substance Use Topics  . Smoking status: Never Smoker   . Smokeless tobacco: Never Used     Comment: Regular Exercise - Yes  . Alcohol Use: No     Colonoscopy:  PAP: 2013  Bone density: Remote  Lipid panel:    OBJECTIVE: Middle-aged white woman in no acute distress Filed Vitals:   01/24/15 1246  BP: 132/61  Pulse: 82  Temp: 98.7 F (37.1 C)  Resp: 18     Body mass index is 29.08 kg/(m^2).    ECOG FS:1 - Symptomatic but completely ambulatory  Sclerae unicteric, pupils round and equal Oropharynx clear and moist-- no thrush or other lesions No cervical or supraclavicular adenopathy Lungs no rales or rhonchi Heart regular rate and rhythm Abd soft, nontender, positive bowel sounds MSK no focal spinal tenderness, no upper extremity lymphedema Neuro: nonfocal, well oriented, appropriate affect Breasts: Status post bilateral mastectomies with bilateral reconstruction, latissimus flap on the left, a TRAM flap on the right. There is no evidence of local recurrence. Both axillae are benign.   LAB RESULTS:  CMP     Component Value Date/Time   NA 144 12/23/2014 1616   NA 144 11/09/2014 1432   K 3.3* 12/23/2014 1616   K 4.1 11/09/2014 1432   CL 108 12/23/2014 1616   CO2 28 12/23/2014 1616   CO2 28 11/09/2014 1432   GLUCOSE 87 12/23/2014 1616   GLUCOSE 86 11/09/2014 1432   BUN 15 12/23/2014 1616   BUN 14.2 11/09/2014  1432   CREATININE 0.93 12/23/2014 1616   CREATININE 1.1 11/09/2014 1432   CREATININE 0.82 09/01/2014 0407   CALCIUM 8.8 12/23/2014 1616   CALCIUM 8.7 11/09/2014 1432   PROT 6.0* 12/23/2014 1616   PROT 6.2* 11/09/2014 1432   ALBUMIN 3.9 12/23/2014 1616   ALBUMIN 3.5 11/09/2014 1432   AST 33 12/23/2014 1616   AST 40* 11/09/2014 1432   ALT 39* 12/23/2014 1616   ALT 48 11/09/2014 1432   ALKPHOS 51 12/23/2014 1616   ALKPHOS 54 11/09/2014 1432   BILITOT 0.3 12/23/2014 1616   BILITOT 0.32 11/09/2014 1432   GFRNONAA 72* 09/01/2014 0407   GFRAA 83* 09/01/2014 0407    INo results found for: SPEP, UPEP  Lab Results  Component Value Date   WBC 7.4 01/24/2015   NEUTROABS 4.7 01/24/2015   HGB 13.6 01/24/2015   HCT 40.0 01/24/2015   MCV 92.6 01/24/2015   PLT 167 01/24/2015      Chemistry      Component Value Date/Time   NA 144 12/23/2014 1616   NA 144 11/09/2014 1432   K 3.3* 12/23/2014 1616   K 4.1 11/09/2014 1432   CL 108 12/23/2014 1616   CO2 28 12/23/2014 1616   CO2 28 11/09/2014 1432   BUN 15 12/23/2014 1616   BUN 14.2 11/09/2014 1432   CREATININE 0.93 12/23/2014 1616   CREATININE 1.1 11/09/2014 1432   CREATININE 0.82 09/01/2014 0407      Component Value Date/Time   CALCIUM 8.8 12/23/2014 1616   CALCIUM 8.7 11/09/2014 1432   ALKPHOS 51 12/23/2014 1616   ALKPHOS 54 11/09/2014 1432   AST 33 12/23/2014 1616   AST 40* 11/09/2014 1432   ALT 39* 12/23/2014 1616   ALT 48 11/09/2014 1432   BILITOT  0.3 12/23/2014 1616   BILITOT 0.32 11/09/2014 1432       No results found for: LABCA2  No components found for: [id number]  No results for input(s): INR in the last 168 hours.  Urinalysis    Component Value Date/Time   COLORURINE YELLOW 08/28/2014 1438   APPEARANCEUR CLEAR 08/28/2014 1438   LABSPEC 1.018 08/28/2014 1438   PHURINE 8.0 08/28/2014 1438   GLUCOSEU NEGATIVE 08/28/2014 1438   GLUCOSEU NEGATIVE 02/06/2011 0845   HGBUR NEGATIVE 08/28/2014 1438    BILIRUBINUR neg 12/23/2014 1734   BILIRUBINUR NEGATIVE 08/28/2014 1438   KETONESUR 15* 08/28/2014 1438   PROTEINUR 2+ 12/23/2014 1734   PROTEINUR NEGATIVE 08/28/2014 1438   UROBILINOGEN 1.0 12/23/2014 1734   UROBILINOGEN 1.0 08/28/2014 1438   NITRITE neg 12/23/2014 1734   NITRITE NEGATIVE 08/28/2014 1438   LEUKOCYTESUR Negative 12/23/2014 1734    RADIOLOGY AND OTHER STUDIES: CLINICAL DATA: Left breast cancer with recent mastectomy. Fever. Nausea and vomiting, query abscess. Drain in place.  EXAM: CT CHEST WITH CONTRAST  TECHNIQUE: Multidetector CT imaging of the chest was performed during intravenous contrast administration.  CONTRAST: 22m OMNIPAQUE IOHEXOL 300 MG/ML SOLN  COMPARISON: 03/15/2014  FINDINGS: Mediastinum/Nodes: No pathologic thoracic adenopathy. Bilateral axillary clips.  Lungs/Pleura: Trace bilateral pleural effusions, left greater than right. Mild biapical pleural parenchymal scarring, right greater than left.  Left upper lobe subpleural lymph node on image 20 of series 5, 0.5 by 0.3 cm, not changed 11/25/2007 and considered benign. 4 mm left lower lobe nodule, image 38 series 5, no change from 11/25/2007. Additional slight left basilar nodularity likewise stable.  Upper abdomen: Diffuse steatosis of the visualized liver. 5 mm right kidney upper pole nonobstructive calculus. Postoperative findings along the gastroesophageal junction.  Musculoskeletal: Thoracic spondylosis.  Left breast expander between the pectoralis muscles. A drain extends along the medial margin of this expander and just superficial to the medial margin of the pectoralis musculature. No abscess identified.  IMPRESSION: 1. No abscess in the vicinity of the drain or tissue expander. 2. Trace bilateral pleural effusions, left greater than right. 3. Small left-sided pulmonary nodules are stable from 2009 and considered benign. 4. Diffuse hepatic steatosis. 5. 5 mm  right kidney upper pole nonobstructive calculus.   Electronically Signed  By: WVan ClinesM.D.  On: 08/29/2014 12:58       ASSESSMENT: 69y.o. [name] woman status post left breast biopsy 05/09/2014 for a clinical T1-T2, N0 (stage I-II) invasive lobular breast cancer, estrogen receptor 100% positive, progesterone receptor 99% positive, with an MIB-1 of 8%, and no HER-2 amplification.  (1) history of right modified radical mastectomy with TRAM reconstruction 1996, followed by chemotherapy for 6 months; did not receive antiestrogen therapy or radiation therapy adjuvantly  (2) tamoxifen started neoadjuvantly 05/18/2014  (3) status post left mastectomy and sentinel lymph node sampling 07/28/2014 for a pT1c pN0(i+). Stage IA invasive ductal carcinoma, grade 1, with repeat HER-2 again negative. Margins were ample  (4) Oncotype score of 3 predicts an outside the breast risk of recurrence within 10 years of 4% if the patient's only systemic treatment is tamoxifen for 5 years. It also predicts no significant benefit from chemotherapy.  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 Sequencing and rearrangement analysis for the following 32 genes found no deleterious  mutations: APC, ATM, BARD1, BMPR1A, BRCA1, BRCA2, BRIP1, CDH1, CDK4, CDKN2A, CHEK2, EPCAM, GREM1, MLH1, MRE11A, MSH2, MSH6, MUTYH, NBN, NF1, PALB2, PMS2, POLD1, POLE, PTEN, RAD50, RAD51D, SMAD4, SMARCA4, STK11, and TP53.  (a) Heterozygous MUTYH mutations are not associated with an increased risk for cancer in the patient, however, other family members may be at risk for homozygous mutations and therefore family testing should be considered.   PLAN: Krupa had understood her risk of recurrence was 4%  and therefore taking tamoxifen seemed a little less important to her. We clarified her risk of recurrence is likely closer to 8% and that does motivate her.  We then discussed aromatase inhibitors as an alternative. She has a good understanding of the possible toxicities, side effects and complications with those agents as compared to tamoxifen.  After much discussion, however, because of the concern regarding bone density, she would like to go back to tamoxifen and that is what we are doing.  I explained that there is an interaction between tamoxifen and fluoxetine and several other antidepressants, and that this affects the metabolism of tamoxifen. However it does not affect the clinical effectiveness of tamoxifen. This is well established for at least postmenopausal women. The same may or may not be true of premenopausal women. Unfortunately this information has not filtered down to the pharmacy level and they are still stating that there is an interaction and that patients need to change their antidepressants for this reasons. I have reassured her she can stay on Paxil and still take tamoxifen.  She is sufficiently dissatisfied with her reconstruction that she would like a second opinion. I am referring her to Dr. Isa Rankin at [facility] for a second opinion  Bobetta Lime will return to see me in 6 months. She knows to call for any problems that may develop before next visit here.  Chauncey Cruel, MD   01/24/2015 1:12 PM Medical Oncology and Hematology [facility] [address] [place], [place] [address] Tel. [phone]    Fax. [phone]

## 2015-01-27 ENCOUNTER — Telehealth: Payer: Self-pay | Admitting: *Deleted

## 2015-01-27 MED ORDER — FUROSEMIDE 40 MG PO TABS: 40.0000 mg | ORAL_TABLET | Freq: Every day | ORAL | Status: DC

## 2015-01-27 NOTE — Telephone Encounter (Signed)
Rx request to pharmacy/SLS  

## 2015-02-01 ENCOUNTER — Telehealth: Payer: Self-pay | Admitting: Oncology

## 2015-02-01 NOTE — Telephone Encounter (Signed)
FAXED PT MEDICAL RECORDS TO DR ROUGHTON'S OFFICE. THEY WILL CALL PT WITH APPT

## 2015-02-03 ENCOUNTER — Encounter: Payer: Self-pay | Admitting: Genetic Counselor

## 2015-02-10 ENCOUNTER — Other Ambulatory Visit: Payer: Medicare HMO

## 2015-02-15 ENCOUNTER — Telehealth: Payer: Self-pay | Admitting: Oncology

## 2015-02-15 NOTE — Telephone Encounter (Signed)
Pt appt. With Dr. Isa Rankin is 02/22/15@12 :30.  Pt is aware.

## 2015-02-17 ENCOUNTER — Ambulatory Visit: Payer: Medicare HMO | Admitting: Nurse Practitioner

## 2015-03-20 ENCOUNTER — Telehealth: Payer: Self-pay

## 2015-03-20 NOTE — Telephone Encounter (Signed)
Called to schedule Medicare Wellness Visit with Health Coach.  Left a message for call back.  

## 2015-03-22 NOTE — Telephone Encounter (Signed)
Pt is returning your call. She says that she was in outpt surgery so she couldn't answer the phone.    CB [phone]

## 2015-03-23 NOTE — Telephone Encounter (Signed)
Appt scheduled on 04/06/15 @ 9:30 am.

## 2015-04-05 ENCOUNTER — Telehealth: Payer: Self-pay | Admitting: Behavioral Health

## 2015-04-05 NOTE — Telephone Encounter (Signed)
Medicare Wellness visit confirmed for 04/06/15 at 9:30 AM.

## 2015-04-06 ENCOUNTER — Ambulatory Visit (INDEPENDENT_AMBULATORY_CARE_PROVIDER_SITE_OTHER): Payer: Medicare HMO

## 2015-04-06 VITALS — BP 122/70 | HR 74 | Ht 67.0 in | Wt 186.0 lb

## 2015-04-06 DIAGNOSIS — Z0000 Encounter for general adult medical examination without abnormal findings: Secondary | ICD-10-CM | POA: Diagnosis not present

## 2015-04-06 DIAGNOSIS — Z1159 Encounter for screening for other viral diseases: Secondary | ICD-10-CM | POA: Diagnosis not present

## 2015-04-06 DIAGNOSIS — Z23 Encounter for immunization: Secondary | ICD-10-CM | POA: Diagnosis not present

## 2015-04-06 MED ORDER — FLUOXETINE HCL 10 MG PO TABS: 10.0000 mg | ORAL_TABLET | Freq: Every day | ORAL | Status: DC

## 2015-04-06 NOTE — Patient Instructions (Addendum)
Bring in advanced directive and results from [place] at next visit.  Continue to eat heart healthy diet (full of fruits, vegetables, whole grains, lean protein, water--limit salt, fat, and sugar intake) and increase physical activity as tolerated.  Talk to oncologist about future mammograms.    Follow up with [name] Aquas, PA-C as scheduled.    Complete Hepatitis C Screening during visit with [place].     Fall Prevention in the Home  Falls can cause injuries. They can happen to people of all ages. There are many things you can do to make your home safe and to help prevent falls.  WHAT CAN I DO ON THE OUTSIDE OF MY HOME?  Regularly fix the edges of walkways and driveways and fix any cracks.  Remove anything that might make you trip as you walk through a door, such as a raised step or threshold.  Trim any bushes or trees on the path to your home.  Use bright outdoor lighting.  Clear any walking paths of anything that might make someone trip, such as rocks or tools.  Regularly check to see if handrails are loose or broken. Make sure that both sides of any steps have handrails.  Any raised decks and porches should have guardrails on the edges.  Have any leaves, snow, or ice cleared regularly.  Use sand or salt on walking paths during winter.  Clean up any spills in your garage right away. This includes oil or grease spills. WHAT CAN I DO IN THE BATHROOM?   Use night lights.  Install grab bars by the toilet and in the tub and shower. Do not use towel bars as grab bars.  Use non-skid mats or decals in the tub or shower.  If you need to sit down in the shower, use a plastic, non-slip stool.  Keep the floor dry. Clean up any water that spills on the floor as soon as it happens.  Remove soap buildup in the tub or shower regularly.  Attach bath mats securely with double-sided non-slip rug tape.  Do not have throw rugs and other things on the floor that can make you trip. WHAT  CAN I DO IN THE BEDROOM?  Use night lights.  Make sure that you have a light by your bed that is easy to reach.  Do not use any sheets or blankets that are too big for your bed. They should not hang down onto the floor.  Have a firm chair that has side arms. You can use this for support while you get dressed.  Do not have throw rugs and other things on the floor that can make you trip. WHAT CAN I DO IN THE KITCHEN?  Clean up any spills right away.  Avoid walking on wet floors.  Keep items that you use a lot in easy-to-reach places.  If you need to reach something above you, use a strong step stool that has a grab bar.  Keep electrical cords out of the way.  Do not use floor polish or wax that makes floors slippery. If you must use wax, use non-skid floor wax.  Do not have throw rugs and other things on the floor that can make you trip. WHAT CAN I DO WITH MY STAIRS?  Do not leave any items on the stairs.  Make sure that there are handrails on both sides of the stairs and use them. Fix handrails that are broken or loose. Make sure that handrails are as long as the stairways.  Check any carpeting to make sure that it is firmly attached to the stairs. Fix any carpet that is loose or worn.  Avoid having throw rugs at the top or bottom of the stairs. If you do have throw rugs, attach them to the floor with carpet tape.  Make sure that you have a light switch at the top of the stairs and the bottom of the stairs. If you do not have them, ask someone to add them for you. WHAT ELSE CAN I DO TO HELP PREVENT FALLS?  Wear shoes that:  Do not have high heels.  Have rubber bottoms.  Are comfortable and fit you well.  Are closed at the toe. Do not wear sandals.  If you use a stepladder:  Make sure that it is fully opened. Do not climb a closed stepladder.  Make sure that both sides of the stepladder are locked into place.  Ask someone to hold it for you, if  possible.  Clearly mark and make sure that you can see:  Any grab bars or handrails.  First and last steps.  Where the edge of each step is.  Use tools that help you move around (mobility aids) if they are needed. These include:  Canes.  Walkers.  Scooters.  Crutches.  Turn on the lights when you go into a dark area. Replace any light bulbs as soon as they burn out.  Set up your furniture so you have a clear path. Avoid moving your furniture around.  If any of your floors are uneven, fix them.  If there are any pets around you, be aware of where they are.  Review your medicines with your doctor. Some medicines can make you feel dizzy. This can increase your chance of falling. Ask your doctor what other things that you can do to help prevent falls.   This information is not intended to replace advice given to you by your health care provider. Make sure you discuss any questions you have with your health care provider.   Document Released: 03/09/2009 Document Revised: 09/27/2014 Document Reviewed: 06/17/2014 Elsevier Interactive Patient Education 2016 [place] Maintenance, Female Adopting a healthy lifestyle and getting preventive care can go a long way to promote health and wellness. Talk with your health care provider about what schedule of regular examinations is right for you. This is a good chance for you to check in with your provider about disease prevention and staying healthy. In between checkups, there are plenty of things you can do on your own. Experts have done a lot of research about which lifestyle changes and preventive measures are most likely to keep you healthy. Ask your health care provider for more information. WEIGHT AND DIET  Eat a healthy diet  Be sure to include plenty of vegetables, fruits, low-fat dairy products, and lean protein.  Do not eat a lot of foods high in solid fats, added sugars, or salt.  Get regular exercise. This is  one of the most important things you can do for your health.  Most adults should exercise for at least 150 minutes each week. The exercise should increase your heart rate and make you sweat (moderate-intensity exercise).  Most adults should also do strengthening exercises at least twice a week. This is in addition to the moderate-intensity exercise.  Maintain a healthy weight  Body mass index (BMI) is a measurement that can be used to identify possible weight problems. It estimates body fat based on height and  weight. Your health care provider can help determine your BMI and help you achieve or maintain a healthy weight.  For females 85 years of age and older:   A BMI below 18.5 is considered underweight.  A BMI of 18.5 to 24.9 is normal.  A BMI of 25 to 29.9 is considered overweight.  A BMI of 30 and above is considered obese.  Watch levels of cholesterol and blood lipids  You should start having your blood tested for lipids and cholesterol at 69 years of age, then have this test every 5 years.  You may need to have your cholesterol levels checked more often if:  Your lipid or cholesterol levels are high.  You are older than 69 years of age.  You are at high risk for heart disease.  CANCER SCREENING   Lung Cancer  Lung cancer screening is recommended for adults 31-36 years old who are at high risk for lung cancer because of a history of smoking.  A yearly low-dose CT scan of the lungs is recommended for people who:  Currently smoke.  Have quit within the past 15 years.  Have at least a 30-pack-year history of smoking. A pack year is smoking an average of one pack of cigarettes a day for 1 year.  Yearly screening should continue until it has been 15 years since you quit.  Yearly screening should stop if you develop a health problem that would prevent you from having lung cancer treatment.  Breast Cancer  Practice breast self-awareness. This means understanding  how your breasts normally appear and feel.  It also means doing regular breast self-exams. Let your health care provider know about any changes, no matter how small.  If you are in your 20s or 30s, you should have a clinical breast exam (CBE) by a health care provider every 1-3 years as part of a regular health exam.  If you are 82 or older, have a CBE every year. Also consider having a breast X-ray (mammogram) every year.  If you have a family history of breast cancer, talk to your health care provider about genetic screening.  If you are at high risk for breast cancer, talk to your health care provider about having an MRI and a mammogram every year.  Breast cancer gene (BRCA) assessment is recommended for women who have family members with BRCA-related cancers. BRCA-related cancers include:  Breast.  Ovarian.  Tubal.  Peritoneal cancers.  Results of the assessment will determine the need for genetic counseling and BRCA1 and BRCA2 testing. Cervical Cancer Your health care provider may recommend that you be screened regularly for cancer of the pelvic organs (ovaries, uterus, and vagina). This screening involves a pelvic examination, including checking for microscopic changes to the surface of your cervix (Pap test). You may be encouraged to have this screening done every 3 years, beginning at age 32.  For women ages 33-65, health care providers may recommend pelvic exams and Pap testing every 3 years, or they may recommend the Pap and pelvic exam, combined with testing for human papilloma virus (HPV), every 5 years. Some types of HPV increase your risk of cervical cancer. Testing for HPV may also be done on women of any age with unclear Pap test results.  Other health care providers may not recommend any screening for nonpregnant women who are considered low risk for pelvic cancer and who do not have symptoms. Ask your health care provider if a screening pelvic exam is right for  you.  If you have had past treatment for cervical cancer or a condition that could lead to cancer, you need Pap tests and screening for cancer for at least 20 years after your treatment. If Pap tests have been discontinued, your risk factors (such as having a new sexual partner) need to be reassessed to determine if screening should resume. Some women have medical problems that increase the chance of getting cervical cancer. In these cases, your health care provider may recommend more frequent screening and Pap tests. Colorectal Cancer  This type of cancer can be detected and often prevented.  Routine colorectal cancer screening usually begins at 69 years of age and continues through 69 years of age.  Your health care provider may recommend screening at an earlier age if you have risk factors for colon cancer.  Your health care provider may also recommend using home test kits to check for hidden blood in the stool.  A small camera at the end of a tube can be used to examine your colon directly (sigmoidoscopy or colonoscopy). This is done to check for the earliest forms of colorectal cancer.  Routine screening usually begins at age 29.  Direct examination of the colon should be repeated every 5-10 years through 69 years of age. However, you may need to be screened more often if early forms of precancerous polyps or small growths are found. Skin Cancer  Check your skin from head to toe regularly.  Tell your health care provider about any new moles or changes in moles, especially if there is a change in a mole's shape or color.  Also tell your health care provider if you have a mole that is larger than the size of a pencil eraser.  Always use sunscreen. Apply sunscreen liberally and repeatedly throughout the day.  Protect yourself by wearing long sleeves, pants, a wide-brimmed hat, and sunglasses whenever you are outside. HEART DISEASE, DIABETES, AND HIGH BLOOD PRESSURE   High blood  pressure causes heart disease and increases the risk of stroke. High blood pressure is more likely to develop in:  People who have blood pressure in the high end of the normal range (130-139/85-89 mm Hg).  People who are overweight or obese.  People who are African American.  If you are 44-85 years of age, have your blood pressure checked every 3-5 years. If you are 64 years of age or older, have your blood pressure checked every year. You should have your blood pressure measured twice--once when you are at a hospital or clinic, and once when you are not at a hospital or clinic. Record the average of the two measurements. To check your blood pressure when you are not at a hospital or clinic, you can use:  An automated blood pressure machine at a pharmacy.  A home blood pressure monitor.  If you are between 33 years and 41 years old, ask your health care provider if you should take aspirin to prevent strokes.  Have regular diabetes screenings. This involves taking a blood sample to check your fasting blood sugar level.  If you are at a normal weight and have a low risk for diabetes, have this test once every three years after 69 years of age.  If you are overweight and have a high risk for diabetes, consider being tested at a younger age or more often. PREVENTING INFECTION  Hepatitis B  If you have a higher risk for hepatitis B, you should be screened for this virus. You  are considered at high risk for hepatitis B if:  You were born in a country where hepatitis B is common. Ask your health care provider which countries are considered high risk.  Your parents were born in a high-risk country, and you have not been immunized against hepatitis B (hepatitis B vaccine).  You have HIV or AIDS.  You use needles to inject street drugs.  You live with someone who has hepatitis B.  You have had sex with someone who has hepatitis B.  You get hemodialysis treatment.  You take certain  medicines for conditions, including cancer, organ transplantation, and autoimmune conditions. Hepatitis C  Blood testing is recommended for:  Everyone born from [birth year] through 1965.  Anyone with known risk factors for hepatitis C. Sexually transmitted infections (STIs)  You should be screened for sexually transmitted infections (STIs) including gonorrhea and chlamydia if:  You are sexually active and are younger than 69 years of age.  You are older than 69 years of age and your health care provider tells you that you are at risk for this type of infection.  Your sexual activity has changed since you were last screened and you are at an increased risk for chlamydia or gonorrhea. Ask your health care provider if you are at risk.  If you do not have HIV, but are at risk, it may be recommended that you take a prescription medicine daily to prevent HIV infection. This is called pre-exposure prophylaxis (PrEP). You are considered at risk if:  You are sexually active and do not regularly use condoms or know the HIV status of your partner(s).  You take drugs by injection.  You are sexually active with a partner who has HIV. Talk with your health care provider about whether you are at high risk of being infected with HIV. If you choose to begin PrEP, you should first be tested for HIV. You should then be tested every 3 months for as long as you are taking PrEP.  PREGNANCY   If you are premenopausal and you may become pregnant, ask your health care provider about preconception counseling.  If you may become pregnant, take 400 to 800 micrograms (mcg) of folic acid every day.  If you want to prevent pregnancy, talk to your health care provider about birth control (contraception). OSTEOPOROSIS AND MENOPAUSE   Osteoporosis is a disease in which the bones lose minerals and strength with aging. This can result in serious bone fractures. Your risk for osteoporosis can be identified using a bone  density scan.  If you are 32 years of age or older, or if you are at risk for osteoporosis and fractures, ask your health care provider if you should be screened.  Ask your health care provider whether you should take a calcium or vitamin D supplement to lower your risk for osteoporosis.  Menopause may have certain physical symptoms and risks.  Hormone replacement therapy may reduce some of these symptoms and risks. Talk to your health care provider about whether hormone replacement therapy is right for you.  HOME CARE INSTRUCTIONS   Schedule regular health, dental, and eye exams.  Stay current with your immunizations.   Do not use any tobacco products including cigarettes, chewing tobacco, or electronic cigarettes.  If you are pregnant, do not drink alcohol.  If you are breastfeeding, limit how much and how often you drink alcohol.  Limit alcohol intake to no more than 1 drink per day for nonpregnant women. One drink equals 12  ounces of beer, 5 ounces of wine, or 1 ounces of hard liquor.  Do not use street drugs.  Do not share needles.  Ask your health care provider for help if you need support or information about quitting drugs.  Tell your health care provider if you often feel depressed.  Tell your health care provider if you have ever been abused or do not feel safe at home.   This information is not intended to replace advice given to you by your health care provider. Make sure you discuss any questions you have with your health care provider.   Document Released: 11/26/2010 Document Revised: 06/03/2014 Document Reviewed: 04/14/2013 Elsevier Interactive Patient Education [employer].

## 2015-04-06 NOTE — Progress Notes (Signed)
u  Subjective:   [name] is a 69 y.o. female who presents for an Initial Medicare Annual Wellness Visit.  Review of Systems: No ROS  Cardiac risk factors:  Age> 36 years old, woman, hypertension   Sleep patterns:  Sleeps at least 8-9 hours per night/Occasionally gets up at least once during the night to void.   Home Safety/Smoke Alarms:  Feels safe at home.  Lives with husband in 1 level home. Smoke alarms present.   Firearm Safety:  Kept in safe place.  Seat Belt Safety/Bike Helmet:  Always wears seat belt.    Counseling:   Eye Exam- 2 years ago per patient Dental- Once a year.--Dr. Charolotte Capuchin Female:   Mammo-05/09/14-hx breast cancer-left and right breast   Dexa scan-05/03/13-osteopenia     CCS-07/25/08-normal, repeat in 10 years.    Immunization:  High dose flu vaccine given during this visit.     Objective:    Today's Vitals   04/06/15 0942  BP: 122/70  Pulse: 74  Height: 5\' 7"  (1.702 m)  Weight: 186 lb (84.369 kg)  SpO2: 97%  PainSc: 3     Current Medications (verified) Outpatient Encounter Prescriptions as of 04/06/2015  Medication Sig  . Cholecalciferol (VITAMIN D3) 1000 UNITS CAPS Take 1 capsule by mouth daily.    . Cyanocobalamin (VITAMIN B-12 CR) 1000 MCG TBCR Take 1 tablet by mouth daily.    Marland Kitchen FLUoxetine (PROZAC) 10 MG tablet Take 1 tablet (10 mg total) by mouth daily.  . furosemide (LASIX) 40 MG tablet Take 1 tablet (40 mg total) by mouth daily.  Marland Kitchen OVER THE COUNTER MEDICATION Probiotic 90 billion-Take 1 tablet by mouth daily.  . tamoxifen (NOLVADEX) 20 MG tablet Take 20 mg by mouth daily.  . [DISCONTINUED] traMADol (ULTRAM) 50 MG tablet Take 1 tablet (50 mg total) by mouth every 8 (eight) hours as needed.   No facility-administered encounter medications on file as of 04/06/2015.    Allergies (verified) Silodosin; Ace inhibitors; Buprenorphine hcl; Morphine and related; Codeine; Sulfonamide derivatives; and Telmisartan   History: Past Medical  History  Diagnosis Date  . GERD (gastroesophageal reflux disease)   . Depression   . Vitamin D deficiency   . Vitamin B12 deficiency   . Lichen sclerosus   . Family history of breast cancer   . Family history of colon cancer   . Family history of pancreatic cancer   . Dyspnea     Normal Spirometry 03/2008 EF 65% BNP normal 11/2007  . Blood dyscrasia     bruises and bleed easily  . History of hiatal hernia   . Complication of anesthesia     went to sleep easily but hard to wake up up until elbow OR in 2010  . Arthritis     "spine" (07/28/2014)  . Chronic back pain     "all over"  . Mild anxiety   . Breast cancer, right breast ([place]) 1995  . Melanoma ([place]) 2010    "right elbow; treated at [facility]"  . Breast cancer, left breast ([place]) 07/28/2014  . Gastric polyps   . Kidney stone     right kidney    Past Surgical History  Procedure Laterality Date  . Temporomandibular joint surgery Bilateral 1987  . Nissen fundoplication  [id number]  . Melanoma excision Right 2010    From elbow-- Done at [facility]   . Tonsillectomy    . Colonoscopy      Dr Sharlett Iles  . Reconstruction breast immediate /  delayed w/ tissue expander Left 07/28/2014  . Hernia repair    . Breast biopsy Left 04/2014  . Mastectomy Right 1996     chemotherapy. pt. states 13 lymph nodes were removed  . Mastectomy complete / simple w/ sentinel node biopsy Left 07/28/2014  . Bunionectomy Bilateral 1970's  . Mastectomy w/ sentinel node biopsy Left 07/28/2014    Procedure: LEFT MASTECTOMY WITH SENTINEL LYMPH NODE MAPPING;  Surgeon: Autumn Messing III, MD;  Location: [place];  Service: General;  Laterality: Left;  . Breast reconstruction with placement of tissue expander and flex hd (acellular hydrated dermis) Left 07/28/2014    Procedure: LEFT BREAST RECONSTRUCTION PLACEMENT OF LEFT TISSUE EXPANDER ;  Surgeon: Crissie Reese, MD;  Location: [place];  Service: Plastics;  Laterality: Left;  . Breast reconstruction with placement of tissue expander and  flex hd (acellular hydrated dermis) Left 09/08/2014    Procedure: REMOVAL OF TISSUE EXPANDER FROM LEFT BREAST;  Surgeon: Crissie Reese, MD;  Location: [place];  Service: Plastics;  Laterality: Left;  . Latissimus flap to breast Left 09/08/2014    Procedure: LEFT LATISSIMUS FLAP TO BREAST WITH SALINE IMPLANT FOR BREAST RECONSTRUCTION;  Surgeon: Crissie Reese, MD;  Location: [place];  Service: Plastics;  Laterality: Left;  . Esophagogastroduodenoscopy     Family History  Problem Relation Age of Onset  . Stroke Other     F 1st degree relative 72, M 1st degree relative  . Colon cancer Cousin 4    double first cousin  . Colon cancer Cousin 85    double first cousin  . Colon polyps Father     between 25-20  . Dementia Father   . Pancreatic cancer Brother 37  . Colon polyps Brother     between 10-20  . Cancer Paternal Uncle     NOS  . Anemia Paternal Grandfather     pernicious anemia  . Breast cancer Cousin 72    double first cousin  . Breast cancer Cousin 74    paternal cousin   Social History   Occupational History  . Manager    Social History Main Topics  . Smoking status: Never Smoker   . Smokeless tobacco: Never Used     Comment: Regular Exercise - Yes  . Alcohol Use: No  . Drug Use: No  . Sexual Activity: Yes    Tobacco Counseling Counseling given: Yes   Activities of Daily Living In your present state of health, do you have any difficulty performing the following activities: 04/06/2015 09/08/2014  Hearing? N N  Vision? N N  Difficulty concentrating or making decisions? N N  Walking or climbing stairs? N N  Dressing or bathing? N N  Doing errands, shopping? N N  Preparing Food and eating ? N -  Using the Toilet? N -  In the past six months, have you accidently leaked urine? Y -  Do you have problems with loss of bowel control? N -  Managing your Medications? N -  Managing your Finances? N -  Housekeeping or managing your Housekeeping? N -    Immunizations and  Health Maintenance Immunization History  Administered Date(s) Administered  . Influenza Split 02/06/2011, 02/10/2012  . Influenza Whole 04/14/2007  . Influenza, High Dose Seasonal PF 04/06/2015  . Influenza,inj,Quad PF,36+ Mos 07/29/2014  . Pneumococcal Polysaccharide-23 07/29/2014  . Tetanus 01/04/2008   Health Maintenance Due  Topic Date Due  . Hepatitis C Screening  January 24, 1946  . DTaP/Tdap/Td (1 - Tdap) 01/27/1965  .  ZOSTAVAX  01/27/2006    Patient Care Team: [name] Jeans, PA-C as PCP - General (Physician Assistant) Josue Hector, MD (Cardiology) Cheri Fowler, MD as Attending Physician (Obstetrics and Gynecology) Chauncey Cruel, MD (Hematology and Oncology) Gatha Mayer, MD as Consulting Physician (Gastroenterology) Iona Beard, MD as Referring Physician (Optometry) Autumn Messing III, MD as Consulting Physician (General Surgery) Ardis Hughs, MD as Attending Physician (Urology) Charolotte Capuchin, MD as Consulting Physician (General Practice)  Indicate any recent Medical Services you may have received from other than [facility] providers in the past year (date may be approximate).     Assessment:   This is a routine wellness examination for [name].  Hearing/Vision screen  Hearing Screening   125Hz  250Hz  500Hz  1000Hz  2000Hz  4000Hz  8000Hz   Right ear:   Pass Pass Pass Pass   Left ear:   Pass Pass Pass Pass   Comments: No changes in hearing.     Vision Screening Comments: No changes in vision. Last Eye Exam- 2 years ago- Dr. Len Childs   Dietary issues and exercise activities discussed: Current Exercise Habits:: The patient has a physically strenous job, but has no regular exercise apart from work. 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  Has been restricted lately due to recent surgery. )   Diet:  Eats 2-3 meals per day.  Yesterday:  Breakfast- 2 cups of cereal + whole milk  Lunch-no lunch  Dinner- Salad bar at [employer] Tuesday.  Cook in the evenings.  Drinks [employer], green tea, very little soda, cranberry grape, very little caffeine drinks.    Goals    . Increase physical activity     Has silver sneakers. Plans to use this year.   Walking and water aerobics      . Travel more and visit with family.        Depression Screen PHQ 2/9 Scores 04/06/2015 01/04/2015  PHQ - 2 Score 0 0    Fall Risk Fall Risk  04/06/2015 01/04/2015  Falls in the past year? Yes No  Number falls in past yr: 1 -  Injury with Fall? No -  Follow up Education provided;Falls prevention discussed -    Cognitive Function: MMSE - Mini Mental State Exam 04/06/2015  Orientation to time 5  Orientation to Place 5  Registration 3  Attention/ Calculation 5  Recall 3  Language- name 2 objects 2  Language- repeat 1  Language- follow 3 step command 3  Language- read & follow direction 1  Write a sentence 1  Copy design 1  Total score 30    Screening Tests Health Maintenance  Topic Date Due  . Hepatitis C Screening  [birth year]  . DTaP/Tdap/Td (1 - Tdap) 01/27/1965  . ZOSTAVAX  01/27/2006  . PNA vac Low Risk Adult (2 of 2 - PCV13) 07/29/2015  . INFLUENZA VACCINE  12/26/2015  . MAMMOGRAM  05/09/2016  . TETANUS/TDAP  01/03/2018  . COLONOSCOPY  07/26/2018  . DEXA SCAN  Completed      Plan:  Bring in advanced directive and results from [facility] at next visit.  Continue to eat heart healthy diet (full of fruits, vegetables, whole grains, lean protein, water--limit salt, fat, and sugar intake) and increase physical activity as tolerated.  Talk to oncologist about future mammograms.    Follow up with Elyn Aquas, PA-C as scheduled.    Complete Hepatitis C Screening during visit with Cody.     During the course of the visit, Parrish was  educated and counseled about the following appropriate screening and preventive services:   Vaccines to include Pneumoccal, Influenza, Hepatitis B, Td, Zostavax, HCV  Electrocardiogram  Cardiovascular disease  screening  Colorectal cancer screening  Bone density screening  Diabetes screening  Glaucoma screening  Mammography/PAP  Nutrition counseling  Smoking cessation counseling  Patient Instructions (the written plan) were given to the patient.    Rudene Anda, RN   04/06/2015

## 2015-04-06 NOTE — Progress Notes (Signed)
Pre visit review using our clinic review tool, if applicable. No additional management support is needed unless otherwise documented below in the visit note. 

## 2015-04-25 ENCOUNTER — Telehealth: Payer: Self-pay

## 2015-04-25 NOTE — Telephone Encounter (Signed)
Joelyn, Andreason - 02/03/15 >','<< Less Detail',event)" href="javascript:;"><< Less Detail   Results Review    Gatha Mayer, MD    Sent: Mon April 24, 2015 5:52 PM    To: Marlon Pel, RN        Message     Looks like she needs a colonoscopy in next few months

## 2015-04-27 NOTE — Telephone Encounter (Signed)
I spoke with the patient and she was under the impression she is not due until 2020.  She would like to know why the change in recommendation prior to scheduling?

## 2015-04-27 NOTE — Telephone Encounter (Signed)
Left message for patient to call back  

## 2015-04-27 NOTE — Telephone Encounter (Signed)
Patient notified Colon scheduled for 06/27/14

## 2015-04-27 NOTE — Telephone Encounter (Signed)
The genetic testing she had this year indicates she could be at higher risk of colon cancer so i recommedn she do one every 5 yrs based upon that Genetics counselor sent me that info

## 2015-05-08 ENCOUNTER — Other Ambulatory Visit: Payer: Self-pay | Admitting: Physician Assistant

## 2015-05-08 NOTE — Telephone Encounter (Signed)
Refill sent per [facility] refill protocol/SLS  

## 2015-06-05 ENCOUNTER — Other Ambulatory Visit: Payer: Self-pay | Admitting: Urology

## 2015-06-05 ENCOUNTER — Encounter: Payer: Self-pay | Admitting: Behavioral Health

## 2015-06-05 ENCOUNTER — Telehealth: Payer: Self-pay | Admitting: Behavioral Health

## 2015-06-05 NOTE — Patient Instructions (Addendum)
[name]  06/05/2015   Your procedure is scheduled on: 06-09-15  Report to [facility] Main  Entrance take [facility]  elevators to 3rd floor to  [place] at  700 AM.  Call this number if you have problems the morning of surgery [phone]   Remember: ONLY 1 PERSON MAY GO WITH YOU TO SHORT STAY TO GET  READY MORNING OF [name].  Do not eat food or drink liquids :After Midnight.     Take these medicines the morning of surgery with A SIP OF WATER: FLUOXETINE (PROZAC), TAMOXIFEN DO NOT TAKE ANY DIABETIC MEDICATIONS DAY OF YOUR SURGERY                               You may not have any metal on your body including hair pins and              piercings  Do not wear jewelry, make-up, lotions, powders or perfumes, deodorant             Do not wear nail polish.  Do not shave  48 hours prior to surgery.              Men may shave face and neck.   Do not bring valuables to the hospital. [name].  Contacts, dentures or bridgework may not be worn into surgery.  Leave suitcase in the car. After surgery it may be brought to your room.     Patients discharged the day of surgery will not be allowed to drive home.  Name and phone number of your driver: spouse [name] [phone]  Special Instructions: N/A              Please read over the following fact sheets you were given: _____________________________________________________________________             [name] - Preparing for Surgery Before surgery, you can play an important role.  Because skin is not sterile, your skin needs to be as free of germs as possible.  You can reduce the number of germs on your skin by washing with CHG (chlorahexidine gluconate) soap before surgery.  CHG is an antiseptic cleaner which kills germs and bonds with the skin to continue killing germs even after washing. Please DO NOT use if you have an allergy to CHG or  antibacterial soaps.  If your skin becomes reddened/irritated stop using the CHG and inform your nurse when you arrive at Short Stay. Do not shave (including legs and underarms) for at least 48 hours prior to the first CHG shower.  You may shave your face/neck. Please follow these instructions carefully:  1.  Shower with CHG Soap the night before surgery and the  morning of Surgery.  2.  If you choose to wash your hair, wash your hair first as usual with your  normal  shampoo.  3.  After you shampoo, rinse your hair and body thoroughly to remove the  shampoo.                           4.  Use CHG as you would any other liquid soap.  You can apply chg directly  to  the skin and wash                       Gently with a scrungie or clean washcloth.  5.  Apply the CHG Soap to your body ONLY FROM THE NECK DOWN.   Do not use on face/ open                           Wound or open sores. Avoid contact with eyes, ears mouth and genitals (private parts).                       Wash face,  Genitals (private parts) with your normal soap.             6.  Wash thoroughly, paying special attention to the area where your surgery  will be performed.  7.  Thoroughly rinse your body with warm water from the neck down.  8.  DO NOT shower/wash with your normal soap after using and rinsing off  the CHG Soap.                9.  Pat yourself dry with a clean towel.            10.  Wear clean pajamas.            11.  Place clean sheets on your bed the night of your first shower and do not  sleep with pets. Day of Surgery : Do not apply any lotions/deodorants the morning of surgery.  Please wear clean clothes to the hospital/surgery center.  FAILURE TO FOLLOW THESE INSTRUCTIONS MAY RESULT IN THE CANCELLATION OF YOUR SURGERY PATIENT SIGNATURE_________________________________  NURSE SIGNATURE__________________________________  ________________________________________________________________________

## 2015-06-05 NOTE — Progress Notes (Signed)
Called and LM on VM for Pam , scheduler for Dr. Louis Meckel for orders to be put in [facility].

## 2015-06-05 NOTE — Telephone Encounter (Signed)
Pre-Visit Call completed with patient and chart updated.   Pre-Visit Info documented in Specialty Comments under SnapShot.    

## 2015-06-06 ENCOUNTER — Encounter: Payer: Medicare HMO | Admitting: Physician Assistant

## 2015-06-07 ENCOUNTER — Encounter (HOSPITAL_COMMUNITY): Payer: Self-pay

## 2015-06-07 ENCOUNTER — Encounter (HOSPITAL_COMMUNITY)
Admission: RE | Admit: 2015-06-07 | Discharge: 2015-06-07 | Disposition: A | Discharge status: Home / Self Care | Payer: Medicare HMO | Source: Ambulatory Visit | Attending: Urology | Admitting: Urology

## 2015-06-07 DIAGNOSIS — F329 Major depressive disorder, single episode, unspecified: Secondary | ICD-10-CM | POA: Diagnosis not present

## 2015-06-07 DIAGNOSIS — M1990 Unspecified osteoarthritis, unspecified site: Secondary | ICD-10-CM | POA: Diagnosis not present

## 2015-06-07 DIAGNOSIS — N201 Calculus of ureter: Secondary | ICD-10-CM | POA: Diagnosis present

## 2015-06-07 DIAGNOSIS — Z79899 Other long term (current) drug therapy: Secondary | ICD-10-CM | POA: Diagnosis not present

## 2015-06-07 DIAGNOSIS — I10 Essential (primary) hypertension: Secondary | ICD-10-CM | POA: Diagnosis not present

## 2015-06-07 DIAGNOSIS — N132 Hydronephrosis with renal and ureteral calculous obstruction: Secondary | ICD-10-CM | POA: Diagnosis not present

## 2015-06-07 DIAGNOSIS — K760 Fatty (change of) liver, not elsewhere classified: Secondary | ICD-10-CM | POA: Diagnosis not present

## 2015-06-07 DIAGNOSIS — R9389 Abnormal findings on diagnostic imaging of other specified body structures: Secondary | ICD-10-CM

## 2015-06-07 DIAGNOSIS — K219 Gastro-esophageal reflux disease without esophagitis: Secondary | ICD-10-CM | POA: Diagnosis not present

## 2015-06-07 LAB — BASIC METABOLIC PANEL
ANION GAP: 7 (ref 5–15)
BUN: 19 mg/dL (ref 6–20)
CALCIUM: 9.3 mg/dL (ref 8.9–10.3)
CO2: 30 mmol/L (ref 22–32)
CREATININE: 1.03 mg/dL — AB (ref 0.44–1.00)
Chloride: 107 mmol/L (ref 101–111)
GFR calc Af Amer: >60 mL/min (ref >60)
GFR calc non Af Amer: 54 mL/min — ABNORMAL LOW (ref >60)
GLUCOSE: 94 mg/dL (ref 65–99)
Potassium: 4.2 mmol/L (ref 3.5–5.1)
Sodium: 144 mmol/L (ref 135–145)

## 2015-06-07 LAB — CBC
HCT: 39.3 % (ref 36.0–46.0)
HEMOGLOBIN: 13.1 g/dL (ref 12.0–15.0)
MCH: 31.3 pg (ref 26.0–34.0)
MCHC: 33.3 g/dL (ref 30.0–36.0)
MCV: 94 fL (ref 78.0–100.0)
Platelets: 171 10*3/uL (ref 150–400)
RBC: 4.18 MIL/uL (ref 3.87–5.11)
RDW: 13 % (ref 11.5–15.5)
WBC: 6.6 10*3/uL (ref 4.0–10.5)

## 2015-06-07 NOTE — Progress Notes (Signed)
Spoke with dr rose anesthesia and madea ware chest ct resuklts from 08-29-14, do not need to repeat chest xray with pre op today per dr rose. ekg 08-28-14 epic

## 2015-06-08 NOTE — Progress Notes (Signed)
Patient notified of time change for surgery on 06/09/2015. Patient instructed to arrive at Short Stay at [facility] at [place] am . Patient verbalized understanding.

## 2015-06-08 NOTE — Anesthesia Preprocedure Evaluation (Signed)
Anesthesia Evaluation  Patient identified by MRN, date of birth, ID band Patient awake    Reviewed: Allergy & Precautions, NPO status   Airway Mallampati: II  TM Distance: >3 FB Neck ROM: Full    Dental no notable dental hx.    Pulmonary    Pulmonary exam normal breath sounds clear to auscultation       Cardiovascular hypertension, Normal cardiovascular exam Rhythm:Regular Rate:Normal     Neuro/Psych Anxiety Depression    GI/Hepatic   Endo/Other    Renal/GU      Musculoskeletal  (+) Arthritis ,   Abdominal   Peds  Hematology   Anesthesia Other Findings "Hard to wake up" at [facility] GERD      Lichen sclerosus    pancreatic cancer   Dyspnea   Normal Spirometry 03/2008 EF 65% BNP normal 11/2007  Blood dyscrasia  bruises and bleed easily History of hiatal hernia   Arthritis   Chronic back pain    Breast cancer,  Melanoma     Reproductive/Obstetrics                             Anesthesia Physical  Anesthesia Plan  ASA: II  Anesthesia Plan: General   Post-op Pain Management:    Induction: Intravenous  Airway Management Planned: LMA  Additional Equipment:   Intra-op Plan:   Post-operative Plan: Extubation in OR  Informed Consent: I have reviewed the patients History and Physical, chart, labs and discussed the procedure including the risks, benefits and alternatives for the proposed anesthesia with the patient or authorized representative who has indicated his/her understanding and acceptance.   Dental advisory given  Plan Discussed with: CRNA  Anesthesia Plan Comments:                                          Anesthesia Evaluation  Patient identified by MRN, date of birth, ID band Patient awake    Reviewed: Allergy & Precautions, NPO status , Patient's Chart, lab work & pertinent test results  History of Anesthesia Complications Negative for: history of  anesthetic complications  Airway Mallampati: II  TM Distance: >3 FB Neck ROM: Full    Dental  (+) Dental Advisory Given, Teeth Intact   Pulmonary shortness of breath and with exertion,  breath sounds clear to auscultation        Cardiovascular hypertension, Rhythm:Regular Rate:Normal  '09 EF 65%   Neuro/Psych  Headaches, PSYCHIATRIC DISORDERS Depression    GI/Hepatic Neg liver ROS, hiatal hernia, GERD-  Medicated and Controlled,  Endo/Other  negative endocrine ROS  Renal/GU negative Renal ROS     Musculoskeletal  (+) Arthritis -,   Abdominal   Peds  Hematology negative hematology ROS (+)   Anesthesia Other Findings Breast cancer  Reproductive/Obstetrics                          Anesthesia Physical Anesthesia Plan  ASA: III  Anesthesia Plan: General   Post-op Pain Management:    Induction: Intravenous  Airway Management Planned: Oral ETT  Additional Equipment:   Intra-op Plan:   Post-operative Plan: Extubation in OR  Informed Consent: I have reviewed the patients History and Physical, chart, labs and discussed the procedure including the risks, benefits and alternatives for the proposed anesthesia with the patient or authorized representative  who has indicated his/her understanding and acceptance.   Dental advisory given  Plan Discussed with: Anesthesiologist, CRNA and Surgeon  Anesthesia Plan Comments: (Plan routine monitors, GETA)       Anesthesia Quick Evaluation                                   Anesthesia Evaluation  Patient identified by MRN, date of birth, ID band Patient awake    Reviewed: Allergy & Precautions, NPO status , Patient's Chart, lab work & pertinent test results  History of Anesthesia Complications Negative for: history of anesthetic complications  Airway Mallampati: II  TM Distance: >3 FB Neck ROM: Full    Dental  (+) Dental Advisory Given, Teeth Intact    Pulmonary shortness of breath and with exertion,  breath sounds clear to auscultation        Cardiovascular hypertension, Rhythm:Regular Rate:Normal  '09 EF 65%   Neuro/Psych  Headaches, PSYCHIATRIC DISORDERS Depression    GI/Hepatic Neg liver ROS, hiatal hernia, GERD-  Medicated and Controlled,  Endo/Other  negative endocrine ROS  Renal/GU negative Renal ROS     Musculoskeletal  (+) Arthritis -,   Abdominal   Peds  Hematology negative hematology ROS (+)   Anesthesia Other Findings Breast cancer  Reproductive/Obstetrics                          Anesthesia Physical Anesthesia Plan  ASA: III  Anesthesia Plan: General   Post-op Pain Management:    Induction: Intravenous  Airway Management Planned: Oral ETT  Additional Equipment:   Intra-op Plan:   Post-operative Plan: Extubation in OR  Informed Consent: I have reviewed the patients History and Physical, chart, labs and discussed the procedure including the risks, benefits and alternatives for the proposed anesthesia with the patient or authorized representative who has indicated his/her understanding and acceptance.   Dental advisory given  Plan Discussed with: Anesthesiologist, CRNA and Surgeon  Anesthesia Plan Comments: (Plan routine monitors, GETA)       Anesthesia Quick Evaluation  Anesthesia Quick Evaluation

## 2015-06-09 ENCOUNTER — Ambulatory Visit (HOSPITAL_COMMUNITY): Payer: Medicare HMO | Admitting: Anesthesiology

## 2015-06-09 ENCOUNTER — Encounter (HOSPITAL_COMMUNITY): Payer: Self-pay

## 2015-06-09 ENCOUNTER — Encounter (HOSPITAL_COMMUNITY)
Admission: RE | Disposition: A | Discharge status: Home / Self Care | Payer: Self-pay | Source: Ambulatory Visit | Attending: Urology

## 2015-06-09 ENCOUNTER — Ambulatory Visit (HOSPITAL_COMMUNITY)
Admission: RE | Admit: 2015-06-09 | Discharge: 2015-06-09 | Disposition: A | Discharge status: Home / Self Care | Payer: Medicare HMO | Source: Ambulatory Visit | Attending: Urology | Admitting: Urology

## 2015-06-09 DIAGNOSIS — K219 Gastro-esophageal reflux disease without esophagitis: Secondary | ICD-10-CM | POA: Diagnosis not present

## 2015-06-09 DIAGNOSIS — F329 Major depressive disorder, single episode, unspecified: Secondary | ICD-10-CM | POA: Diagnosis not present

## 2015-06-09 DIAGNOSIS — M1990 Unspecified osteoarthritis, unspecified site: Secondary | ICD-10-CM | POA: Insufficient documentation

## 2015-06-09 DIAGNOSIS — N200 Calculus of kidney: Secondary | ICD-10-CM

## 2015-06-09 DIAGNOSIS — N132 Hydronephrosis with renal and ureteral calculous obstruction: Secondary | ICD-10-CM | POA: Diagnosis not present

## 2015-06-09 DIAGNOSIS — K760 Fatty (change of) liver, not elsewhere classified: Secondary | ICD-10-CM | POA: Diagnosis not present

## 2015-06-09 DIAGNOSIS — I10 Essential (primary) hypertension: Secondary | ICD-10-CM | POA: Insufficient documentation

## 2015-06-09 DIAGNOSIS — Z79899 Other long term (current) drug therapy: Secondary | ICD-10-CM | POA: Insufficient documentation

## 2015-06-09 HISTORY — PX: HOLMIUM LASER APPLICATION: SHX5852

## 2015-06-09 HISTORY — PX: CYSTOSCOPY WITH RETROGRADE PYELOGRAM, URETEROSCOPY AND STENT PLACEMENT: SHX5789

## 2015-06-09 SURGERY — CYSTOURETEROSCOPY, WITH RETROGRADE PYELOGRAM AND STENT INSERTION
Anesthesia: General | Site: Ureter | Laterality: Right

## 2015-06-09 MED ORDER — BELLADONNA ALKALOIDS-OPIUM 16.2-60 MG RE SUPP
RECTAL | Status: AC
  Filled 2015-06-09: qty 1

## 2015-06-09 MED ORDER — FENTANYL CITRATE (PF) 100 MCG/2ML IJ SOLN
INTRAMUSCULAR | Status: DC | PRN
  Administered 2015-06-09 (×2): 50 ug via INTRAVENOUS

## 2015-06-09 MED ORDER — SODIUM CHLORIDE 0.9 % IR SOLN
Status: DC | PRN
  Administered 2015-06-09: 4000 mL

## 2015-06-09 MED ORDER — FENTANYL CITRATE (PF) 100 MCG/2ML IJ SOLN
25.0000 ug | INTRAMUSCULAR | Status: DC | PRN
Start: 2015-06-09 — End: 2015-06-09

## 2015-06-09 MED ORDER — TROSPIUM CHLORIDE ER 60 MG PO CP24: 60.0000 mg | ORAL_CAPSULE | Freq: Every day | ORAL | Status: DC

## 2015-06-09 MED ORDER — PROPOFOL 10 MG/ML IV BOLUS
INTRAVENOUS | Status: AC
  Filled 2015-06-09: qty 20

## 2015-06-09 MED ORDER — CIPROFLOXACIN HCL 500 MG PO TABS: 500.0000 mg | ORAL_TABLET | Freq: Once | ORAL | Status: DC

## 2015-06-09 MED ORDER — DEXAMETHASONE SODIUM PHOSPHATE 10 MG/ML IJ SOLN
INTRAMUSCULAR | Status: DC | PRN
  Administered 2015-06-09: 10 mg via INTRAVENOUS

## 2015-06-09 MED ORDER — 0.9 % SODIUM CHLORIDE (POUR BTL) OPTIME
TOPICAL | Status: DC | PRN
Start: 2015-06-09 — End: 2015-06-09
  Administered 2015-06-09: 1000 mL

## 2015-06-09 MED ORDER — CIPROFLOXACIN IN D5W 400 MG/200ML IV SOLN
400.0000 mg | INTRAVENOUS | Status: AC
  Administered 2015-06-09: 400 mg via INTRAVENOUS

## 2015-06-09 MED ORDER — PHENAZOPYRIDINE HCL 200 MG PO TABS: 200.0000 mg | ORAL_TABLET | Freq: Three times a day (TID) | ORAL | Status: DC | PRN

## 2015-06-09 MED ORDER — LIDOCAINE HCL (CARDIAC) 20 MG/ML IV SOLN
INTRAVENOUS | Status: DC | PRN
  Administered 2015-06-09: 100 mg via INTRAVENOUS

## 2015-06-09 MED ORDER — ONDANSETRON HCL 4 MG/2ML IJ SOLN
INTRAMUSCULAR | Status: AC
  Filled 2015-06-09: qty 2

## 2015-06-09 MED ORDER — ACETAMINOPHEN 160 MG/5ML PO SOLN: 650.0000 mg | Freq: Once | ORAL | Status: DC

## 2015-06-09 MED ORDER — DEXAMETHASONE SODIUM PHOSPHATE 10 MG/ML IJ SOLN
INTRAMUSCULAR | Status: AC
  Filled 2015-06-09: qty 1

## 2015-06-09 MED ORDER — BELLADONNA ALKALOIDS-OPIUM 16.2-60 MG RE SUPP
RECTAL | Status: DC | PRN
  Administered 2015-06-09: 1 via RECTAL

## 2015-06-09 MED ORDER — FENTANYL CITRATE (PF) 100 MCG/2ML IJ SOLN
INTRAMUSCULAR | Status: AC
Start: 2015-06-09 — End: 2015-06-09
  Filled 2015-06-09: qty 2

## 2015-06-09 MED ORDER — LACTATED RINGERS IV SOLN
INTRAVENOUS | Status: DC | PRN
  Administered 2015-06-09: 08:00:00 via INTRAVENOUS

## 2015-06-09 MED ORDER — ONDANSETRON HCL 4 MG/2ML IJ SOLN
INTRAMUSCULAR | Status: DC | PRN
  Administered 2015-06-09: 4 mg via INTRAVENOUS

## 2015-06-09 MED ORDER — LIDOCAINE HCL (CARDIAC) 20 MG/ML IV SOLN
INTRAVENOUS | Status: AC
  Filled 2015-06-09: qty 5

## 2015-06-09 MED ORDER — IOHEXOL 300 MG/ML  SOLN
INTRAMUSCULAR | Status: DC | PRN
  Administered 2015-06-09: 50 mL

## 2015-06-09 MED ORDER — PROPOFOL 10 MG/ML IV BOLUS
INTRAVENOUS | Status: DC | PRN
  Administered 2015-06-09: 140 mg via INTRAVENOUS

## 2015-06-09 MED ORDER — CIPROFLOXACIN IN D5W 400 MG/200ML IV SOLN
INTRAVENOUS | Status: AC
  Filled 2015-06-09: qty 200

## 2015-06-09 MED FILL — CIPROFLOXACIN HCL 500 MG TA: 500 | 1 days supply | Qty: 1 | Fill #0

## 2015-06-09 SURGICAL SUPPLY — 19 items
BAG URO CATCHER STRL LF (MISCELLANEOUS) ×2 IMPLANT
BASKET DAKOTA 1.9FR 11X120 (BASKET) IMPLANT
BASKET ZERO TIP NITINOL 2.4FR (BASKET) IMPLANT
BSKT STON RTRVL ZERO TP 2.4FR (BASKET)
CATH URET 5FR 28IN OPEN ENDED (CATHETERS) ×2 IMPLANT
CLOTH BEACON ORANGE TIMEOUT ST (SAFETY) ×2 IMPLANT
FIBER LASER FLEXIVA 365 (UROLOGICAL SUPPLIES) ×2 IMPLANT
GLOVE BIOGEL M STRL SZ7.5 (GLOVE) ×2 IMPLANT
GOWN STRL REUS W/TWL XL LVL3 (GOWN DISPOSABLE) ×2 IMPLANT
GUIDEWIRE ANG ZIPWIRE 038X150 (WIRE) IMPLANT
GUIDEWIRE COONS BENTSON MOVE (WIRE) ×2 IMPLANT
GUIDEWIRE STR DUAL SENSOR (WIRE) ×2 IMPLANT
MANIFOLD NEPTUNE II (INSTRUMENTS) ×2 IMPLANT
PACK CYSTO (CUSTOM PROCEDURE TRAY) ×2 IMPLANT
SHEATH ACCESS URETERAL 24CM (SHEATH) IMPLANT
SHEATH ACCESS URETERAL 38CM (SHEATH) IMPLANT
SHEATH ACCESS URETERAL 54CM (SHEATH) IMPLANT
STENT CONTOUR 6FRX26X.038 (STENTS) ×2 IMPLANT
TUBING CONNECTING 10 (TUBING) ×2 IMPLANT

## 2015-06-09 NOTE — Anesthesia Postprocedure Evaluation (Signed)
Anesthesia Post Note  Patient: [name]  Procedure(s) Performed: Procedure(s) (LRB): CYSTOSCOPY WITH RIGHT RETROGRADE PYELOGRAM, RIGHT URETEROSCOPY AND RIGHT URTERAL STENT PLACEMENT (Right) HOLMIUM LASER APPLICATION (Right)  Patient location during evaluation: PACU Anesthesia Type: General Level of consciousness: awake and alert Pain management: pain level controlled Vital Signs Assessment: post-procedure vital signs reviewed and stable Respiratory status: spontaneous breathing, nonlabored ventilation, respiratory function stable and patient connected to nasal cannula oxygen Cardiovascular status: blood pressure returned to baseline and stable Postop Assessment: no signs of nausea or vomiting Anesthetic complications: no    Last Vitals:  Filed Vitals:   06/09/15 0624  BP: 147/79  Pulse: 66  Temp: 36.6 C  Resp: 18    Last Pain:  Filed Vitals:   06/09/15 0653  PainSc: 6                  Zenaida Deed

## 2015-06-09 NOTE — Op Note (Signed)
Preoperative diagnosis: right ureteral calculus  Postoperative diagnosis: right ureteral calculus  Procedure:  1. Cystoscopy 2. right ureteroscopy and stone removal 3. Right pyeloscopy and stone removal 4. Ureteroscopic laser lithotripsy 5. right 82F x 26cm ureteral stent placement  6. right retrograde pyelography with interpretation  Surgeon: Ardis Hughs, MD  Anesthesia: General  Complications: None  Intraoperative findings: right retrograde pyelography demonstrated a filling defect within the right ureter consistent with the patient's known calculus without other abnormalities. There was hydronephrosis proximal to the ureteral stone. There are no filling defects within the right collecting system.  EBL: Minimal  Specimens: 1. right ureteral calculus 2. Right renal calculus  Disposition of specimens: [facility] for stone analysis  Indication: [name] is a 70 y.o.   patient with urolithiasis. After reviewing the management options for treatment, the patient elected to proceed with the above surgical procedure(s). We have discussed the potential benefits and risks of the procedure, side effects of the proposed treatment, the likelihood of the patient achieving the goals of the procedure, and any potential problems that might occur during the procedure or recuperation. Informed consent has been obtained.  Description of procedure:  The patient was taken to the operating room and general anesthesia was induced.  The patient was placed in the dorsal lithotomy position, prepped and draped in the usual sterile fashion, and preoperative antibiotics were administered. A preoperative time-out was performed.   Cystourethroscopy was performed.  The patient's urethra was examined and was normal. The bladder was then systematically examined in its entirety. There was no evidence for any bladder tumors, stones, or other mucosal pathology.    Attention then  turned to the right ureteral orifice and a ureteral catheter was used to intubate the ureteral orifice.  Omnipaque contrast was injected through the ureteral catheter and a retrograde pyelogram was performed with findings as dictated above.  A 0.38 sensor guidewire was then advanced up the right ureter into the renal pelvis under fluoroscopic guidance. The 6 Fr semirigid ureteroscope was then advanced into the ureter next to the guidewire and the calculus was identified.   The stone was then fragmented with the 365 micron holmium laser fiber on a setting of 0.6 and frequency of 6 Hz.   All stones were then removed from the ureter with a [place] basket.  Reinspection of the ureter revealed no remaining visible stones or fragments. I then advanced a second wire through the ureteroscope up into the right renal pelvis and removed the rigid scope over the wire. I then advanced a single-lumen flexible ureteroscope over the second wire through the urethra and into the right ureter. This advanced quite easily. Once up to the proximal ureter removed the wire and inserted the scope into the right renal pelvis. Pyeloscopy was then performed using contrast to ensure that all calyces had been inspected. I did encounter the stone in the mid pole calyx that we had seen on CT scan. I was able to remove the stone using a [place] basket without fragmenting it. This was removed quite easily. The ureteroscope was then backed out under visual guidance to ensure that the remainder of the ureter was intact without any residual stone fragments.  The wire was then backloaded through the cystoscope and a ureteral stent was advance over the wire using Seldinger technique.  The stent was positioned appropriately under fluoroscopic and cystoscopic guidance.  The wire was then removed with an adequate stent curl noted in the renal pelvis as  well as in the bladder.  The bladder was then emptied and the procedure ended.  The patient  appeared to tolerate the procedure well and without complications.  The patient was able to be awakened and transferred to the recovery unit in satisfactory condition.   Disposition: The tether of the stent was left on and tucked inside the patient's vagina.  Instructions for removing the stent have been provided to the patient. This has been scheduled for followup in 6 weeks with a renal ultrasound.

## 2015-06-09 NOTE — Discharge Instructions (Signed)
DISCHARGE INSTRUCTIONS FOR KIDNEY STONE/URETERAL STENT   MEDICATIONS:  1.  Resume all your other meds from home - except do not take any extra narcotic pain meds that you may have at home.  2. Trospium is to prevent bladder spasms and help reduce urinary frequency. 3. Pyridium is to help with the burning/stinging when you urinate. 4. Take Cipro one hour prior to removal of your stent.   ACTIVITY:  1. No strenuous activity x 1week  2. No driving while on narcotic pain medications  3. Drink plenty of water  4. Continue to walk at home - you can still get blood clots when you are at home, so keep active, but don't over do it.  5. May return to work/school tomorrow or when you feel ready   BATHING:  1. You can shower and we recommend daily showers  2. You have a string coming from your urethra: The stent string is attached to your ureteral stent. Do not pull on this.   SIGNS/SYMPTOMS TO CALL:  Please call us if you have a fever greater than 101.5, uncontrolled nausea/vomiting, uncontrolled pain, dizziness, unable to urinate, bloody urine, chest pain, shortness of breath, leg swelling, leg pain, redness around wound, drainage from wound, or any other concerns or questions.   You can reach [place] at [phone].   FOLLOW-UP:  1. You have an appointment in 6 weeks with a ultrasound of your kidneys prior.   2. You have a string attached to your stent, you may remove it on Monday Jan 16. To do this, pull the strings until the stents are completely removed. You may feel an odd sensation in your back.

## 2015-06-09 NOTE — H&P (Signed)
Reason For Visit Surveillance of her right distal ureteral stone   History of Present Illness 70F who presents today for follow-up of her right distal ureteral stone. She was first diagnosed with this in Sept 29. She has been on medical explusion therapy. It has been difficult to visualize on KUB over the last several visits visit. She has a second, smaller stone in the right renal pelvis seen on her CT scan.   Intv: Since the patient was last seen she has had several episodes of right lower quadrant pain. She is also had some back pain. She's been treating this with ibuprofen. Her pain has been intermittent, but she continues to not feel well. She denies any fevers/chills. She denies any progressive voiding symptoms. She has not had any gross hematuria.  The patient has undergone a CT scan prior to her visit today.   Past Medical History Problems  1. History of Fatty liver disease, nonalcoholic ([id number]) 2. History of arthritis (Z87.39) 3. History of breast cancer (Z85.3) 4. History of esophageal reflux (Z87.19) 5. History of malignant melanoma (Z85.820) 6. History of Mild depression (F32.0)  Surgical History Problems  1. History of Breast Surgery 2. History of Breast Surgery Reconstruction 3. History of Esophagogastric Fundoplasty Laparoscopic 4. History of Simple Bunion Exostectomy (Silver Procedure) 5. History of Tonsillectomy  Current Meds 1. B-12 TABS;  Therapy: (Recorded:01Sep2016) to Recorded 2. Lasix 40 MG Oral Tablet;  Therapy: (Recorded:01Sep2016) to Recorded 3. Probiotic CAPS;  Therapy: (Recorded:01Sep2016) to Recorded 4. PROzac 10 MG Oral Capsule;  Therapy: (Recorded:01Sep2016) to Recorded 5. Sprix 15.75 MG/SPRAY Nasal Solution; INSTILL 1 SQUIRT Every 8 hours;  Therapy: 13Oct2016 to (Last Rx:13Oct2016)  Requested for: 13Oct2016 Ordered 6. Tamoxifen Citrate TABS;  Therapy: (Recorded:01Sep2016) to Recorded 7. Vitamin D3 CAPS;  Therapy: (Recorded:01Sep2016) to  Recorded  Allergies Medication  1. ACE Inhibitors 2. codeine 3. Micardis TABS 4. Rapaflo CAPS 5. Sulfa Drugs  Family History Problems  1. Family history of anemia (Z83.2) : Mother 2. Family history of cerebrovascular accident (CVA) (Z82.3) : Mother 3. Family history of pancreatic cancer (Z80.0) : Brother 4. Family history of Heart problem : Father 5. Family history of Throat cancer : Brother  Social History Problems  1. Denied: History of Alcohol use 2. Daily caffeine consumption   4 per day 3. Death in the family, father   age 32 due to heart/age, natural causes 4. Death in the family, mother   age 18 due to anemia 5. Employed   owner/operator 6. Never a smoker 7. Two children   1 son and 1 daughter  Vitals Vital Signs [Data Includes: Last 1 Day]  Recorded: [id number] 09:55AM  Blood Pressure: 163 / 96 Heart Rate: 82  Physical Exam Constitutional: Well nourished and well developed . No acute distress.  ENT:. The ears and nose are normal in appearance.  Neck: The appearance of the neck is normal and no neck mass is present.  Pulmonary: No respiratory distress and normal respiratory rhythm and effort.  Cardiovascular: Heart rate and rhythm are normal . No peripheral edema.  Abdomen: The abdomen is soft and nontender. No masses are palpated. Right CVA tenderness and no left CVA tenderness. No hernias are palpable. No hepatosplenomegaly noted.  Neuro/Psych:. Mood and affect are appropriate.    Results/Data Selected Results  AU CT-STONE PROTOCOL [id number] 12:00AM Louis Meckel   Test Name Result Flag Reference  CT-STONE PROTOCOL (Report)    ** RADIOLOGY REPORT BY [facility] **   CLINICAL DATA: Right  lower quadrant and low back pain. Micro hematuria. Nephrolithiasis.  EXAM: CT ABDOMEN AND PELVIS WITHOUT CONTRAST  TECHNIQUE: Multidetector CT imaging of the abdomen and pelvis was performed following the standard protocol without IV  contrast.  COMPARISON: 02/23/2015  FINDINGS: Lower chest: No acute findings.  Hepatobiliary: No mass visualized on this un-enhanced exam. Severe hepatic steatosis again demonstrated, with focal fatty sparing adjacent gallbladder fossa. Gallbladder sludge versus tiny gallstones again noted, without evidence of cholecystitis or biliary dilatation.  Pancreas: No mass or inflammatory process identified on this un-enhanced exam.  Spleen: Within normal limits in size.  Adrenals/Urinary Tract: Adrenal glands are unremarkable. 3 mm calculus again seen in the anterior midpole of right kidney. Moderate right hydronephrosis and ureterectasis seen due to a 5 mm calculus in the proximal pelvic portion of the right ureter on image 61/series 2 which is migrated distally from the proximal right ureter on previous study.  Stomach/Bowel: No evidence of obstruction, inflammatory process, or abnormal fluid collections.  Vascular/Lymphatic: No pathologically enlarged lymph nodes. No evidence of abdominal aortic aneurysm.  Reproductive: Probable tiny uterine fibroids appear stable. Stable benign-appearing left ovarian cyst measuring 2.1 cm also remains stable compared to earlier study on 11/12/2013.  Other: None.  Musculoskeletal: No suspicious bone lesions identified.  IMPRESSION: 5 mm calculus in proximal pelvic portion of right ureter, which shows distal migration since previous study. Moderate right hydroureteronephrosis shows no significant change.  Stable severe hepatic steatosis, and gallbladder sludge versus tiny gallstones.   Electronically Signed  By: Earle Gell M.D.  On: 06/05/2015 10:37   UA With REFLEX [id number] 03:57PM Louis Meckel  SPECIMEN TYPE: CLEAN CATCH   Test Name Result Flag Reference  COLOR YELLOW  YELLOW  ** PLEASE NOTE CHANGE IN UNIT OF MEASURE AND REFERENCE RANGE(S). **  APPEARANCE CLEAR  CLEAR  SPECIFIC GRAVITY 1.025  1.001-1.035  pH 5.5  5.0-8.0   GLUCOSE NEGATIVE  NEGATIVE  BILIRUBIN NEGATIVE  NEGATIVE  KETONE NEGATIVE  NEGATIVE  BLOOD TRACE A NEGATIVE  PROTEIN NEGATIVE  NEGATIVE  NITRITE NEGATIVE  NEGATIVE  LEUKOCYTE ESTERASE TRACE A NEGATIVE  SQUAMOUS EPITHELIAL/HPF 0-5 HPF  <=5  WBC 10-20 WBC/HPF A <=5  RBC 3-10 RBC/HPF A <=2  BACTERIA FEW HPF A NONE SEEN  CRYSTALS NONE SEEN HPF  NONE SEEN  CASTS NONE SEEN LPF  NONE SEEN  Other     FEW MUCOUS THREADS  Yeast NONE SEEN HPF  NONE SEEN    I have independently reviewed the patient's CT scan, she continues to have right-sided hydronephrosis with what appears to be 2 stones in the mid/distal ureter. She also has one stone up in the right kidney which is nonobstructing.  Discussion/Summary The patient is now had her stone in the right mid/distal ureter without any significant movement for the last 3 months. As such, I recommended the patient undergo intervention. Unfortunately, shockwave lithotripsy is not a viable option as these stones are not visible on plain KUB. As such, I recommended she consider ureteroscopy. I discussed the procedure with the patient in quite some detail. She understands that following the operation she will have a stent. She will likely also have a stent tether which she will be have to remove around in 5 days. I'll follow the patient 6 weeks after the surgery for renal ultrasound.

## 2015-06-09 NOTE — Transfer of Care (Signed)
Immediate Anesthesia Transfer of Care Note  Patient: [name]  Procedure(s) Performed: Procedure(s): CYSTOSCOPY WITH RIGHT RETROGRADE PYELOGRAM, RIGHT URETEROSCOPY AND RIGHT URTERAL STENT PLACEMENT (Right) HOLMIUM LASER APPLICATION (Right)  Patient Location: PACU  Anesthesia Type:General  Level of Consciousness: sedated  Airway & Oxygen Therapy: Patient Spontanous Breathing and Patient connected to face mask oxygen  Post-op Assessment: Report given to RN and Post -op Vital signs reviewed and stable  Post vital signs: Reviewed and stable  Last Vitals:  Filed Vitals:   06/09/15 0624  BP: 147/79  Pulse: 66  Temp: 36.6 C  Resp: 18    Complications: No apparent anesthesia complications

## 2015-06-13 ENCOUNTER — Ambulatory Visit (INDEPENDENT_AMBULATORY_CARE_PROVIDER_SITE_OTHER): Payer: Medicare HMO | Admitting: Physician Assistant

## 2015-06-13 ENCOUNTER — Encounter: Payer: Self-pay | Admitting: Physician Assistant

## 2015-06-13 VITALS — BP 134/60 | HR 70 | Temp 98.3°F | Ht 67.0 in | Wt 186.8 lb

## 2015-06-13 DIAGNOSIS — F329 Major depressive disorder, single episode, unspecified: Secondary | ICD-10-CM

## 2015-06-13 DIAGNOSIS — E559 Vitamin D deficiency, unspecified: Secondary | ICD-10-CM

## 2015-06-13 DIAGNOSIS — I10 Essential (primary) hypertension: Secondary | ICD-10-CM

## 2015-06-13 DIAGNOSIS — Z0000 Encounter for general adult medical examination without abnormal findings: Secondary | ICD-10-CM | POA: Diagnosis not present

## 2015-06-13 DIAGNOSIS — F32A Depression, unspecified: Secondary | ICD-10-CM

## 2015-06-13 LAB — LIPID PANEL
CHOLESTEROL: 186 mg/dL (ref 0–200)
HDL: 40.1 mg/dL (ref >39.00)
LDL CALC: 128 mg/dL — AB (ref 0–99)
NonHDL: 146.1
Total CHOL/HDL Ratio: 5
Triglycerides: 91 mg/dL (ref 0.0–149.0)
VLDL: 18.2 mg/dL (ref 0.0–40.0)

## 2015-06-13 LAB — VITAMIN D 25 HYDROXY (VIT D DEFICIENCY, FRACTURES): VITD: 41.32 ng/mL (ref 30.00–100.00)

## 2015-06-13 LAB — CBC
HCT: 39.8 % (ref 36.0–46.0)
Hemoglobin: 13.4 g/dL (ref 12.0–15.0)
MCHC: 33.6 g/dL (ref 30.0–36.0)
MCV: 91.4 fl (ref 78.0–100.0)
PLATELETS: 182 10*3/uL (ref 150.0–400.0)
RBC: 4.36 Mil/uL (ref 3.87–5.11)
RDW: 13.2 % (ref 11.5–15.5)
WBC: 5.9 10*3/uL (ref 4.0–10.5)

## 2015-06-13 LAB — URINALYSIS, ROUTINE W REFLEX MICROSCOPIC
Bilirubin Urine: NEGATIVE
KETONES UR: NEGATIVE
Leukocytes, UA: NEGATIVE
Nitrite: NEGATIVE
PH: 6 (ref 5.0–8.0)
SPECIFIC GRAVITY, URINE: 1.025 (ref 1.000–1.030)
Total Protein, Urine: 100 — AB
UROBILINOGEN UA: 1 (ref 0.0–1.0)
Urine Glucose: NEGATIVE

## 2015-06-13 LAB — COMPREHENSIVE METABOLIC PANEL
ALBUMIN: 3.7 g/dL (ref 3.5–5.2)
ALT: 21 U/L (ref 0–35)
AST: 20 U/L (ref 0–37)
Alkaline Phosphatase: 49 U/L (ref 39–117)
BILIRUBIN TOTAL: 0.4 mg/dL (ref 0.2–1.2)
BUN: 18 mg/dL (ref 6–23)
CALCIUM: 8.9 mg/dL (ref 8.4–10.5)
CO2: 34 meq/L — AB (ref 19–32)
CREATININE: 1.08 mg/dL (ref 0.40–1.20)
Chloride: 105 mEq/L (ref 96–112)
GFR: 53.41 mL/min — ABNORMAL LOW (ref >60.00)
Glucose, Bld: 94 mg/dL (ref 70–99)
Potassium: 3.9 mEq/L (ref 3.5–5.1)
Sodium: 143 mEq/L (ref 135–145)
Total Protein: 6.4 g/dL (ref 6.0–8.3)

## 2015-06-13 LAB — TSH: TSH: 1.83 u[IU]/mL (ref 0.35–4.50)

## 2015-06-13 LAB — HEMOGLOBIN A1C: HEMOGLOBIN A1C: 5.4 % (ref 4.6–6.5)

## 2015-06-13 MED ORDER — FLUOXETINE HCL 10 MG PO TABS: 10.0000 mg | ORAL_TABLET | Freq: Every day | ORAL | Status: DC

## 2015-06-13 MED FILL — FLUoxetine HCL 10 MG TABS: 10 | 30 days supply | Qty: 30 | Fill #0

## 2015-06-13 NOTE — Assessment & Plan Note (Signed)
Stable. Asymptomatic. Continue current regimen. Follow-up with Cardiology as scheduled.

## 2015-06-13 NOTE — Assessment & Plan Note (Signed)
Stable. Have previously discussed her Fluoxetine with the addition of Tamoxifen and increased risk of recurrence. Was previously on multiple other regimens but this has been was has successfully worked. Oncology has previously ok'd this medication. We will continue for now with plans to wean off in the next few months.

## 2015-06-13 NOTE — Progress Notes (Signed)
Pre visit review using our clinic review tool, if applicable. No additional management support is needed unless otherwise documented below in the visit note. 

## 2015-06-13 NOTE — Progress Notes (Signed)
Patient presents to clinic today for annual exam.  Patient is fasting for labs.  Acute Concerns: Denies acute concerns at today's visit.  Chronic Issues: Depression -- Currently on Fluoxetine 10 mg daily without side effect. Denies breakthrough symptoms with medication. Denies SI/HI.  Breast Cancer -- Followed by Dr. Jana Hakim. Has next appointment in February. Is taking tamoxifen 20 mg daily.   Health Maintenance: Dental -- up-to-date Vision -- up-to-date Immunizations -- tetanus and flu up-to-date Colonoscopy -- up-to-date Mammogram -- up-to-date; s/p bilateral mastectomy   Past Medical History  Diagnosis Date  . GERD (gastroesophageal reflux disease)   . Depression   . Vitamin D deficiency   . Vitamin B12 deficiency   . Lichen sclerosus   . Family history of breast cancer   . Family history of colon cancer   . Family history of pancreatic cancer   . Dyspnea     Normal Spirometry 03/2008 EF 65% BNP normal 11/2007  . Blood dyscrasia     bruises and bleed easily  . History of hiatal hernia   . Arthritis     "spine" (07/28/2014)  . Chronic back pain     "all over"  . Mild anxiety   . Gastric polyps   . Kidney stone     right kidney   . Breast cancer, right breast ([place]) 1995  . Melanoma ([place]) 2010    "right elbow; treated at [facility]"  . Breast cancer, left breast ([place]) 07/28/2014  . Complication of anesthesia     went to sleep easily but hard to wake up up until elbow OR in 2010    Past Surgical History  Procedure Laterality Date  . Temporomandibular joint surgery Bilateral 1987  . Nissen fundoplication  74/0814  . Melanoma excision Right 2010    From elbow-- Done at [facility]   . Tonsillectomy    . Colonoscopy      Dr Sharlett Iles  . Reconstruction breast immediate / delayed w/ tissue expander Left 07/28/2014  . Hernia repair    . Breast biopsy Left 04/2014  . Mastectomy Right 1996     chemotherapy. pt. states 13 lymph nodes were removed  . Mastectomy complete /  simple w/ sentinel node biopsy Left 07/28/2014  . Bunionectomy Bilateral 1970's  . Mastectomy w/ sentinel node biopsy Left 07/28/2014    Procedure: LEFT MASTECTOMY WITH SENTINEL LYMPH NODE MAPPING;  Surgeon: Autumn Messing III, MD;  Location: [place];  Service: General;  Laterality: Left;  . Breast reconstruction with placement of tissue expander and flex hd (acellular hydrated dermis) Left 07/28/2014    Procedure: LEFT BREAST RECONSTRUCTION PLACEMENT OF LEFT TISSUE EXPANDER ;  Surgeon: Crissie Reese, MD;  Location: [place];  Service: Plastics;  Laterality: Left;  . Breast reconstruction with placement of tissue expander and flex hd (acellular hydrated dermis) Left 09/08/2014    Procedure: REMOVAL OF TISSUE EXPANDER FROM LEFT BREAST;  Surgeon: Crissie Reese, MD;  Location: [place];  Service: Plastics;  Laterality: Left;  . Latissimus flap to breast Left 09/08/2014    Procedure: LEFT LATISSIMUS FLAP TO BREAST WITH SALINE IMPLANT FOR BREAST RECONSTRUCTION;  Surgeon: Crissie Reese, MD;  Location: [place];  Service: Plastics;  Laterality: Left;  . Esophagogastroduodenoscopy    . Cystoscopy with retrograde pyelogram, ureteroscopy and stent placement Right 06/09/2015    Procedure: CYSTOSCOPY WITH RIGHT RETROGRADE PYELOGRAM, RIGHT URETEROSCOPY AND RIGHT URTERAL STENT PLACEMENT;  Surgeon: Ardis Hughs, MD;  Location: [facility];  Service: Urology;  Laterality:  Right;  . Holmium laser application Right 3/55/9741    Procedure: HOLMIUM LASER APPLICATION;  Surgeon: Ardis Hughs, MD;  Location: [facility];  Service: Urology;  Laterality: Right;    Current Outpatient Prescriptions on File Prior to Visit  Medication Sig Dispense Refill  . Cholecalciferol (VITAMIN D3) 1000 UNITS CAPS Take 1 capsule by mouth daily.      . Cyanocobalamin (VITAMIN B-12 CR) 1000 MCG TBCR Take 1 tablet by mouth daily.      . furosemide (LASIX) 40 MG tablet TAKE 1 TABLET (40 MG TOTAL) BY MOUTH DAILY. 90 tablet 0  . OVER THE COUNTER MEDICATION Probiotic  90 billion-Take 1 tablet by mouth daily.    . tamoxifen (NOLVADEX) 20 MG tablet Take 20 mg by mouth daily.    . [DISCONTINUED] sucralfate (CARAFATE) 1 GM/10ML suspension Take 1 g by mouth at bedtime as needed.       No current facility-administered medications on file prior to visit.    Allergies  Allergen Reactions  . Silodosin Rash    Facial rash  . Ace Inhibitors Other (See Comments)    REACTION: angioedema  . Buprenorphine Hcl Other (See Comments)    "crazy"  . Morphine And Related Other (See Comments)    "crazy"  . Codeine Rash  . Sulfonamide Derivatives Rash  . Telmisartan Rash    Family History  Problem Relation Age of Onset  . Stroke Other     F 1st degree relative 1, M 1st degree relative  . Colon cancer Cousin 1    double first cousin  . Colon cancer Cousin 57    double first cousin  . Colon polyps Father     between 33-20  . Dementia Father   . Pancreatic cancer Brother 73  . Colon polyps Brother     between 10-20  . Cancer Paternal Uncle     NOS  . Anemia Paternal Grandfather     pernicious anemia  . Breast cancer Cousin 42    double first cousin  . Breast cancer Cousin 74    paternal cousin    Social History   Social History  . Marital Status: Married    Spouse Name: N/A  . Number of Children: 2  . Years of Education: N/A   Occupational History  . Manager    Social History Main Topics  . Smoking status: Never Smoker   . Smokeless tobacco: Never Used     Comment: Regular Exercise - Yes  . Alcohol Use: No  . Drug Use: No  . Sexual Activity: Yes   Other Topics Concern  . Not on file   Social History Narrative   Review of Systems  Constitutional: Negative for fever and weight loss.  HENT: Negative for ear discharge, ear pain, hearing loss and tinnitus.   Eyes: Negative for blurred vision, double vision, photophobia and pain.  Respiratory: Negative for cough and shortness of breath.   Cardiovascular: Negative for chest pain and  palpitations.  Gastrointestinal: Negative for heartburn, nausea, vomiting, abdominal pain, diarrhea, constipation, blood in stool and melena.  Genitourinary: Negative for dysuria, urgency, frequency, hematuria and flank pain.  Musculoskeletal: Negative for falls.  Neurological: Negative for dizziness, loss of consciousness and headaches.  Endo/Heme/Allergies: Negative for environmental allergies.  Psychiatric/Behavioral: Negative for depression, suicidal ideas, hallucinations and substance abuse. The patient is not nervous/anxious and does not have insomnia.    BP 134/60 mmHg  Pulse 70  Temp(Src) 98.3 F (36.8 C) (Oral)  Ht _0  (1.702 m)  Wt 186 lb 12.8 oz (84.732 kg)  BMI 29.25 kg/m2  SpO2 99%  Physical Exam  Constitutional: She is oriented to person, place, and time and well-developed, well-nourished, and in no distress.  HENT:  Head: Normocephalic and atraumatic.  Eyes: Conjunctivae are normal.  Neck: Neck supple.  Cardiovascular: Normal rate, regular rhythm, normal heart sounds and intact distal pulses.   Pulmonary/Chest: Effort normal and breath sounds normal. No respiratory distress. She has no wheezes. She has no rales. She exhibits no tenderness.  Neurological: She is alert and oriented to person, place, and time.  Skin: Skin is warm and dry. No rash noted.  Psychiatric: Affect normal.  Vitals reviewed.  Recent Results (from the past 2160 hour(s))  Basic metabolic panel     Status: Abnormal   Collection Time: 06/07/15  9:05 AM  Result Value Ref Range   Sodium 144 135 - 145 mmol/L   Potassium 4.2 3.5 - 5.1 mmol/L   Chloride 107 101 - 111 mmol/L   CO2 30 22 - 32 mmol/L   Glucose, Bld 94 65 - 99 mg/dL   BUN 19 6 - 20 mg/dL   Creatinine, Ser 1.03 (H) 0.44 - 1.00 mg/dL   Calcium 9.3 8.9 - 10.3 mg/dL   GFR calc non Af Amer 54 (L) >60 mL/min   GFR calc Af Amer >60 >60 mL/min    Comment: (NOTE) The eGFR has been calculated using the CKD EPI equation. This calculation  has not been validated in all clinical situations. eGFR's persistently <60 mL/min signify possible Chronic Kidney Disease.    Anion gap 7 5 - 15  CBC     Status: None   Collection Time: 06/07/15  9:05 AM  Result Value Ref Range   WBC 6.6 4.0 - 10.5 K/uL   RBC 4.18 3.87 - 5.11 MIL/uL   Hemoglobin 13.1 12.0 - 15.0 g/dL   HCT 39.3 36.0 - 46.0 %   MCV 94.0 78.0 - 100.0 fL   MCH 31.3 26.0 - 34.0 pg   MCHC 33.3 30.0 - 36.0 g/dL   RDW 13.0 11.5 - 15.5 %   Platelets 171 150 - 400 K/uL   Assessment/Plan: Depression Stable. Have previously discussed her Fluoxetine with the addition of Tamoxifen and increased risk of recurrence. Was previously on multiple other regimens but this has been was has successfully worked. Oncology has previously ok'd this medication. We will continue for now with plans to wean off in the next few months.  Essential hypertension Stable. Asymptomatic. Continue current regimen. Follow-up with Cardiology as scheduled.  Vitamin D deficiency With osteopenia. Will check Calcium and Vitamin D today. Continue supplements as directed. Will repeat DEXA later this year.  Visit for preventive health examination Depression screen negative. Health Maintenance reviewed -- Immunizations and colonoscopy up-to-date. Patient s/p bilateral mastectomy. Will follow-up with Oncology for further imaging. Preventive schedule discussed and handout given in AVS. Will obtain fasting labs today.

## 2015-06-13 NOTE — Patient Instructions (Signed)
Please go to the lab for blood work.  I will call you with your results. If your blood work is normal we will follow-up yearly for physicals.  Continue medications as directed. Start a 1200 mg daily in addition to your Vitamin D.   Please follow-up with your specialists. I will make a reminder to call you when we need to repeat a carotid ultrasound and bone density scan.  Preventive Care for Adults, Female A healthy lifestyle and preventive care can promote health and wellness. Preventive health guidelines for women include the following key practices.  A routine yearly physical is a good way to check with your health care provider about your health and preventive screening. It is a chance to share any concerns and updates on your health and to receive a thorough exam.  Visit your dentist for a routine exam and preventive care every 6 months. Brush your teeth twice a day and floss once a day. Good oral hygiene prevents tooth decay and gum disease.  The frequency of eye exams is based on your age, health, family medical history, use of contact lenses, and other factors. Follow your health care provider's recommendations for frequency of eye exams.  Eat a healthy diet. Foods like vegetables, fruits, whole grains, low-fat dairy products, and lean protein foods contain the nutrients you need without too many calories. Decrease your intake of foods high in solid fats, added sugars, and salt. Eat the right amount of calories for you.Get information about a proper diet from your health care provider, if necessary.  Regular physical exercise is one of the most important things you can do for your health. Most adults should get at least 150 minutes of moderate-intensity exercise (any activity that increases your heart rate and causes you to sweat) each week. In addition, most adults need muscle-strengthening exercises on 2 or more days a week.  Maintain a healthy weight. The body mass index (BMI) is a  screening tool to identify possible weight problems. It provides an estimate of body fat based on height and weight. Your health care provider can find your BMI and can help you achieve or maintain a healthy weight.For adults 20 years and older:  A BMI below 18.5 is considered underweight.  A BMI of 18.5 to 24.9 is normal.  A BMI of 25 to 29.9 is considered overweight.  A BMI of 30 and above is considered obese.  Maintain normal blood lipids and cholesterol levels by exercising and minimizing your intake of saturated fat. Eat a balanced diet with plenty of fruit and vegetables. Blood tests for lipids and cholesterol should begin at age 82 and be repeated every 5 years. If your lipid or cholesterol levels are high, you are over 50, or you are at high risk for heart disease, you may need your cholesterol levels checked more frequently.Ongoing high lipid and cholesterol levels should be treated with medicines if diet and exercise are not working.  If you smoke, find out from your health care provider how to quit. If you do not use tobacco, do not start.  Lung cancer screening is recommended for adults aged 19-80 years who are at high risk for developing lung cancer because of a history of smoking. A yearly low-dose CT scan of the lungs is recommended for people who have at least a 30-pack-year history of smoking and are a current smoker or have quit within the past 15 years. A pack year of smoking is smoking an average of 1 pack  of cigarettes a day for 1 year (for example: 1 pack a day for 30 years or 2 packs a day for 15 years). Yearly screening should continue until the smoker has stopped smoking for at least 15 years. Yearly screening should be stopped for people who develop a health problem that would prevent them from having lung cancer treatment.  If you are pregnant, do not drink alcohol. If you are breastfeeding, be very cautious about drinking alcohol. If you are not pregnant and choose to  drink alcohol, do not have more than 1 drink per day. One drink is considered to be 12 ounces (355 mL) of beer, 5 ounces (148 mL) of wine, or 1.5 ounces (44 mL) of liquor.  Avoid use of street drugs. Do not share needles with anyone. Ask for help if you need support or instructions about stopping the use of drugs.  High blood pressure causes heart disease and increases the risk of stroke. Your blood pressure should be checked at least every 1 to 2 years. Ongoing high blood pressure should be treated with medicines if weight loss and exercise do not work.  If you are 28-82 years old, ask your health care provider if you should take aspirin to prevent strokes.  Diabetes screening is done by taking a blood sample to check your blood glucose level after you have not eaten for a certain period of time (fasting). If you are not overweight and you do not have risk factors for diabetes, you should be screened once every 3 years starting at age 77. If you are overweight or obese and you are 61-85 years of age, you should be screened for diabetes every year as part of your cardiovascular risk assessment.  Breast cancer screening is essential preventive care for women. You should practice "breast self-awareness." This means understanding the normal appearance and feel of your breasts and may include breast self-examination. Any changes detected, no matter how small, should be reported to a health care provider. Women in their 20s and 30s should have a clinical breast exam (CBE) by a health care provider as part of a regular health exam every 1 to 3 years. After age 75, women should have a CBE every year. Starting at age 61, women should consider having a mammogram (breast X-ray test) every year. Women who have a family history of breast cancer should talk to their health care provider about genetic screening. Women at a high risk of breast cancer should talk to their health care providers about having an MRI and a  mammogram every year.  Breast cancer gene (BRCA)-related cancer risk assessment is recommended for women who have family members with BRCA-related cancers. BRCA-related cancers include breast, ovarian, tubal, and peritoneal cancers. Having family members with these cancers may be associated with an increased risk for harmful changes (mutations) in the breast cancer genes BRCA1 and BRCA2. Results of the assessment will determine the need for genetic counseling and BRCA1 and BRCA2 testing.  Your health care provider may recommend that you be screened regularly for cancer of the pelvic organs (ovaries, uterus, and vagina). This screening involves a pelvic examination, including checking for microscopic changes to the surface of your cervix (Pap test). You may be encouraged to have this screening done every 3 years, beginning at age 8.  For women ages 74-65, health care providers may recommend pelvic exams and Pap testing every 3 years, or they may recommend the Pap and pelvic exam, combined with testing for human papilloma  virus (HPV), every 5 years. Some types of HPV increase your risk of cervical cancer. Testing for HPV may also be done on women of any age with unclear Pap test results.  Other health care providers may not recommend any screening for nonpregnant women who are considered low risk for pelvic cancer and who do not have symptoms. Ask your health care provider if a screening pelvic exam is right for you.  If you have had past treatment for cervical cancer or a condition that could lead to cancer, you need Pap tests and screening for cancer for at least 20 years after your treatment. If Pap tests have been discontinued, your risk factors (such as having a new sexual partner) need to be reassessed to determine if screening should resume. Some women have medical problems that increase the chance of getting cervical cancer. In these cases, your health care provider may recommend more frequent  screening and Pap tests.  Colorectal cancer can be detected and often prevented. Most routine colorectal cancer screening begins at the age of 38 years and continues through age 58 years. However, your health care provider may recommend screening at an earlier age if you have risk factors for colon cancer. On a yearly basis, your health care provider may provide home test kits to check for hidden blood in the stool. Use of a small camera at the end of a tube, to directly examine the colon (sigmoidoscopy or colonoscopy), can detect the earliest forms of colorectal cancer. Talk to your health care provider about this at age 81, when routine screening begins. Direct exam of the colon should be repeated every 5-10 years through age 68 years, unless early forms of precancerous polyps or small growths are found.  People who are at an increased risk for hepatitis B should be screened for this virus. You are considered at high risk for hepatitis B if:  You were born in a country where hepatitis B occurs often. Talk with your health care provider about which countries are considered high risk.  Your parents were born in a high-risk country and you have not received a shot to protect against hepatitis B (hepatitis B vaccine).  You have HIV or AIDS.  You use needles to inject street drugs.  You live with, or have sex with, someone who has hepatitis B.  You get hemodialysis treatment.  You take certain medicines for conditions like cancer, organ transplantation, and autoimmune conditions.  Hepatitis C blood testing is recommended for all people born from [birth year] through 1965 and any individual with known risks for hepatitis C.  Practice safe sex. Use condoms and avoid high-risk sexual practices to reduce the spread of sexually transmitted infections (STIs). STIs include gonorrhea, chlamydia, syphilis, trichomonas, herpes, HPV, and human immunodeficiency virus (HIV). Herpes, HIV, and HPV are viral illnesses  that have no cure. They can result in disability, cancer, and death.  You should be screened for sexually transmitted illnesses (STIs) including gonorrhea and chlamydia if:  You are sexually active and are younger than 24 years.  You are older than 24 years and your health care provider tells you that you are at risk for this type of infection.  Your sexual activity has changed since you were last screened and you are at an increased risk for chlamydia or gonorrhea. Ask your health care provider if you are at risk.  If you are at risk of being infected with HIV, it is recommended that you take a prescription medicine daily  to prevent HIV infection. This is called preexposure prophylaxis (PrEP). You are considered at risk if:  You are sexually active and do not regularly use condoms or know the HIV status of your partner(s).  You take drugs by injection.  You are sexually active with a partner who has HIV.  Talk with your health care provider about whether you are at high risk of being infected with HIV. If you choose to begin PrEP, you should first be tested for HIV. You should then be tested every 3 months for as long as you are taking PrEP.  Osteoporosis is a disease in which the bones lose minerals and strength with aging. This can result in serious bone fractures or breaks. The risk of osteoporosis can be identified using a bone density scan. Women ages 40 years and over and women at risk for fractures or osteoporosis should discuss screening with their health care providers. Ask your health care provider whether you should take a calcium supplement or vitamin D to reduce the rate of osteoporosis.  Menopause can be associated with physical symptoms and risks. Hormone replacement therapy is available to decrease symptoms and risks. You should talk to your health care provider about whether hormone replacement therapy is right for you.  Use sunscreen. Apply sunscreen liberally and  repeatedly throughout the day. You should seek shade when your shadow is shorter than you. Protect yourself by wearing long sleeves, pants, a wide-brimmed hat, and sunglasses year round, whenever you are outdoors.  Once a month, do a whole body skin exam, using a mirror to look at the skin on your back. Tell your health care provider of new moles, moles that have irregular borders, moles that are larger than a pencil eraser, or moles that have changed in shape or color.  Stay current with required vaccines (immunizations).  Influenza vaccine. All adults should be immunized every year.  Tetanus, diphtheria, and acellular pertussis (Td, Tdap) vaccine. Pregnant women should receive 1 dose of Tdap vaccine during each pregnancy. The dose should be obtained regardless of the length of time since the last dose. Immunization is preferred during the 27th-36th week of gestation. An adult who has not previously received Tdap or who does not know her vaccine status should receive 1 dose of Tdap. This initial dose should be followed by tetanus and diphtheria toxoids (Td) booster doses every 10 years. Adults with an unknown or incomplete history of completing a 3-dose immunization series with Td-containing vaccines should begin or complete a primary immunization series including a Tdap dose. Adults should receive a Td booster every 10 years.  Varicella vaccine. An adult without evidence of immunity to varicella should receive 2 doses or a second dose if she has previously received 1 dose. Pregnant females who do not have evidence of immunity should receive the first dose after pregnancy. This first dose should be obtained before leaving the health care facility. The second dose should be obtained 4-8 weeks after the first dose.  Human papillomavirus (HPV) vaccine. Females aged 13-26 years who have not received the vaccine previously should obtain the 3-dose series. The vaccine is not recommended for use in pregnant  females. However, pregnancy testing is not needed before receiving a dose. If a female is found to be pregnant after receiving a dose, no treatment is needed. In that case, the remaining doses should be delayed until after the pregnancy. Immunization is recommended for any person with an immunocompromised condition through the age of 84 years if  she did not get any or all doses earlier. During the 3-dose series, the second dose should be obtained 4-8 weeks after the first dose. The third dose should be obtained 24 weeks after the first dose and 16 weeks after the second dose.  Zoster vaccine. One dose is recommended for adults aged 94 years or older unless certain conditions are present.  Measles, mumps, and rubella (MMR) vaccine. Adults born before [birth year] generally are considered immune to measles and mumps. Adults born in [birth year] or later should have 1 or more doses of MMR vaccine unless there is a contraindication to the vaccine or there is laboratory evidence of immunity to each of the three diseases. A routine second dose of MMR vaccine should be obtained at least 28 days after the first dose for students attending postsecondary schools, health care workers, or international travelers. People who received inactivated measles vaccine or an unknown type of measles vaccine during 1963-1967 should receive 2 doses of MMR vaccine. People who received inactivated mumps vaccine or an unknown type of mumps vaccine before 1979 and are at high risk for mumps infection should consider immunization with 2 doses of MMR vaccine. For females of childbearing age, rubella immunity should be determined. If there is no evidence of immunity, females who are not pregnant should be vaccinated. If there is no evidence of immunity, females who are pregnant should delay immunization until after pregnancy. Unvaccinated health care workers born before [birth year] who lack laboratory evidence of measles, mumps, or rubella immunity or laboratory  confirmation of disease should consider measles and mumps immunization with 2 doses of MMR vaccine or rubella immunization with 1 dose of MMR vaccine.  Pneumococcal 13-valent conjugate (PCV13) vaccine. When indicated, a person who is uncertain of his immunization history and has no record of immunization should receive the PCV13 vaccine. All adults 59 years of age and older should receive this vaccine. An adult aged 74 years or older who has certain medical conditions and has not been previously immunized should receive 1 dose of PCV13 vaccine. This PCV13 should be followed with a dose of pneumococcal polysaccharide (PPSV23) vaccine. Adults who are at high risk for pneumococcal disease should obtain the PPSV23 vaccine at least 8 weeks after the dose of PCV13 vaccine. Adults older than 70 years of age who have normal immune system function should obtain the PPSV23 vaccine dose at least 1 year after the dose of PCV13 vaccine.  Pneumococcal polysaccharide (PPSV23) vaccine. When PCV13 is also indicated, PCV13 should be obtained first. All adults aged 59 years and older should be immunized. An adult younger than age 40 years who has certain medical conditions should be immunized. Any person who resides in a nursing home or long-term care facility should be immunized. An adult smoker should be immunized. People with an immunocompromised condition and certain other conditions should receive both PCV13 and PPSV23 vaccines. People with human immunodeficiency virus (HIV) infection should be immunized as soon as possible after diagnosis. Immunization during chemotherapy or radiation therapy should be avoided. Routine use of PPSV23 vaccine is not recommended for American Indians, [place] Natives, or people younger than 65 years unless there are medical conditions that require PPSV23 vaccine. When indicated, people who have unknown immunization and have no record of immunization should receive PPSV23 vaccine. One-time  revaccination 5 years after the first dose of PPSV23 is recommended for people aged 19-64 years who have chronic kidney failure, nephrotic syndrome, asplenia, or immunocompromised conditions. People who received 1-2 doses of  PPSV23 before age 40 years should receive another dose of PPSV23 vaccine at age 68 years or later if at least 5 years have passed since the previous dose. Doses of PPSV23 are not needed for people immunized with PPSV23 at or after age 5 years.  Meningococcal vaccine. Adults with asplenia or persistent complement component deficiencies should receive 2 doses of quadrivalent meningococcal conjugate (MenACWY-D) vaccine. The doses should be obtained at least 2 months apart. Microbiologists working with certain meningococcal bacteria, [place] recruits, people at risk during an outbreak, and people who travel to or live in countries with a high rate of meningitis should be immunized. A first-year college student up through age 23 years who is living in a residence hall should receive a dose if she did not receive a dose on or after her 16th birthday. Adults who have certain high-risk conditions should receive one or more doses of vaccine.  Hepatitis A vaccine. Adults who wish to be protected from this disease, have certain high-risk conditions, work with hepatitis A-infected animals, work in hepatitis A research labs, or travel to or work in countries with a high rate of hepatitis A should be immunized. Adults who were previously unvaccinated and who anticipate close contact with an international adoptee during the first 60 days after arrival in the [place] from a country with a high rate of hepatitis A should be immunized.  Hepatitis B vaccine. Adults who wish to be protected from this disease, have certain high-risk conditions, may be exposed to blood or other infectious body fluids, are household contacts or sex partners of hepatitis B positive people, are clients or workers in  certain care facilities, or travel to or work in countries with a high rate of hepatitis B should be immunized.  Haemophilus influenzae type b (Hib) vaccine. A previously unvaccinated person with asplenia or sickle cell disease or having a scheduled splenectomy should receive 1 dose of Hib vaccine. Regardless of previous immunization, a recipient of a hematopoietic stem cell transplant should receive a 3-dose series 6-12 months after her successful transplant. Hib vaccine is not recommended for adults with HIV infection. Preventive Services / Frequency Ages 33 to 59 years  Blood pressure check.** / Every 3-5 years.  Lipid and cholesterol check.** / Every 5 years beginning at age 55.  Clinical breast exam.** / Every 3 years for women in their 44s and 27s.  BRCA-related cancer risk assessment.** / For women who have family members with a BRCA-related cancer (breast, ovarian, tubal, or peritoneal cancers).  Pap test.** / Every 2 years from ages 99 through 39. Every 3 years starting at age 61 through age 31 or 58 with a history of 3 consecutive normal Pap tests.  HPV screening.** / Every 3 years from ages 77 through ages 63 to 75 with a history of 3 consecutive normal Pap tests.  Hepatitis C blood test.** / For any individual with known risks for hepatitis C.  Skin self-exam. / Monthly.  Influenza vaccine. / Every year.  Tetanus, diphtheria, and acellular pertussis (Tdap, Td) vaccine.** / Consult your health care provider. Pregnant women should receive 1 dose of Tdap vaccine during each pregnancy. 1 dose of Td every 10 years.  Varicella vaccine.** / Consult your health care provider. Pregnant females who do not have evidence of immunity should receive the first dose after pregnancy.  HPV vaccine. / 3 doses over 6 months, if 46 and younger. The vaccine is not recommended for use in pregnant females. However, pregnancy testing  is not needed before receiving a dose.  Measles, mumps, rubella  (MMR) vaccine.** / You need at least 1 dose of MMR if you were born in 1957 or later. You may also need a 2nd dose. For females of childbearing age, rubella immunity should be determined. If there is no evidence of immunity, females who are not pregnant should be vaccinated. If there is no evidence of immunity, females who are pregnant should delay immunization until after pregnancy.  Pneumococcal 13-valent conjugate (PCV13) vaccine.** / Consult your health care provider.  Pneumococcal polysaccharide (PPSV23) vaccine.** / 1 to 2 doses if you smoke cigarettes or if you have certain conditions.  Meningococcal vaccine.** / 1 dose if you are age 67 to 49 years and a Market researcher living in a residence hall, or have one of several medical conditions, you need to get vaccinated against meningococcal disease. You may also need additional booster doses.  Hepatitis A vaccine.** / Consult your health care provider.  Hepatitis B vaccine.** / Consult your health care provider.  Haemophilus influenzae type b (Hib) vaccine.** / Consult your health care provider. Ages 31 to 57 years  Blood pressure check.** / Every year.  Lipid and cholesterol check.** / Every 5 years beginning at age 28 years.  Lung cancer screening. / Every year if you are aged 43-80 years and have a 30-pack-year history of smoking and currently smoke or have quit within the past 15 years. Yearly screening is stopped once you have quit smoking for at least 15 years or develop a health problem that would prevent you from having lung cancer treatment.  Clinical breast exam.** / Every year after age 51 years.  BRCA-related cancer risk assessment.** / For women who have family members with a BRCA-related cancer (breast, ovarian, tubal, or peritoneal cancers).  Mammogram.** / Every year beginning at age 32 years and continuing for as long as you are in good health. Consult with your health care provider.  Pap test.** / Every 3  years starting at age 72 years through age 16 or 69 years with a history of 3 consecutive normal Pap tests.  HPV screening.** / Every 3 years from ages 22 years through ages 67 to 19 years with a history of 3 consecutive normal Pap tests.  Fecal occult blood test (FOBT) of stool. / Every year beginning at age 64 years and continuing until age 64 years. You may not need to do this test if you get a colonoscopy every 10 years.  Flexible sigmoidoscopy or colonoscopy.** / Every 5 years for a flexible sigmoidoscopy or every 10 years for a colonoscopy beginning at age 73 years and continuing until age 81 years.  Hepatitis C blood test.** / For all people born from [birth year] through 1965 and any individual with known risks for hepatitis C.  Skin self-exam. / Monthly.  Influenza vaccine. / Every year.  Tetanus, diphtheria, and acellular pertussis (Tdap/Td) vaccine.** / Consult your health care provider. Pregnant women should receive 1 dose of Tdap vaccine during each pregnancy. 1 dose of Td every 10 years.  Varicella vaccine.** / Consult your health care provider. Pregnant females who do not have evidence of immunity should receive the first dose after pregnancy.  Zoster vaccine.** / 1 dose for adults aged 24 years or older.  Measles, mumps, rubella (MMR) vaccine.** / You need at least 1 dose of MMR if you were born in 1957 or later. You may also need a second dose. For females of childbearing age, rubella  immunity should be determined. If there is no evidence of immunity, females who are not pregnant should be vaccinated. If there is no evidence of immunity, females who are pregnant should delay immunization until after pregnancy.  Pneumococcal 13-valent conjugate (PCV13) vaccine.** / Consult your health care provider.  Pneumococcal polysaccharide (PPSV23) vaccine.** / 1 to 2 doses if you smoke cigarettes or if you have certain conditions.  Meningococcal vaccine.** / Consult your health care  provider.  Hepatitis A vaccine.** / Consult your health care provider.  Hepatitis B vaccine.** / Consult your health care provider.  Haemophilus influenzae type b (Hib) vaccine.** / Consult your health care provider. Ages 66 years and over  Blood pressure check.** / Every year.  Lipid and cholesterol check.** / Every 5 years beginning at age 26 years.  Lung cancer screening. / Every year if you are aged 7-80 years and have a 30-pack-year history of smoking and currently smoke or have quit within the past 15 years. Yearly screening is stopped once you have quit smoking for at least 15 years or develop a health problem that would prevent you from having lung cancer treatment.  Clinical breast exam.** / Every year after age 19 years.  BRCA-related cancer risk assessment.** / For women who have family members with a BRCA-related cancer (breast, ovarian, tubal, or peritoneal cancers).  Mammogram.** / Every year beginning at age 68 years and continuing for as long as you are in good health. Consult with your health care provider.  Pap test.** / Every 3 years starting at age 36 years through age 10 or 7 years with 3 consecutive normal Pap tests. Testing can be stopped between 65 and 70 years with 3 consecutive normal Pap tests and no abnormal Pap or HPV tests in the past 10 years.  HPV screening.** / Every 3 years from ages 17 years through ages 23 or 48 years with a history of 3 consecutive normal Pap tests. Testing can be stopped between 65 and 70 years with 3 consecutive normal Pap tests and no abnormal Pap or HPV tests in the past 10 years.  Fecal occult blood test (FOBT) of stool. / Every year beginning at age 38 years and continuing until age 89 years. You may not need to do this test if you get a colonoscopy every 10 years.  Flexible sigmoidoscopy or colonoscopy.** / Every 5 years for a flexible sigmoidoscopy or every 10 years for a colonoscopy beginning at age 36 years and continuing  until age 11 years.  Hepatitis C blood test.** / For all people born from [birth year] through 1965 and any individual with known risks for hepatitis C.  Osteoporosis screening.** / A one-time screening for women ages 62 years and over and women at risk for fractures or osteoporosis.  Skin self-exam. / Monthly.  Influenza vaccine. / Every year.  Tetanus, diphtheria, and acellular pertussis (Tdap/Td) vaccine.** / 1 dose of Td every 10 years.  Varicella vaccine.** / Consult your health care provider.  Zoster vaccine.** / 1 dose for adults aged 1 years or older.  Pneumococcal 13-valent conjugate (PCV13) vaccine.** / Consult your health care provider.  Pneumococcal polysaccharide (PPSV23) vaccine.** / 1 dose for all adults aged 74 years and older.  Meningococcal vaccine.** / Consult your health care provider.  Hepatitis A vaccine.** / Consult your health care provider.  Hepatitis B vaccine.** / Consult your health care provider.  Haemophilus influenzae type b (Hib) vaccine.** / Consult your health care provider. ** Family history and personal history  of risk and conditions may change your health care provider's recommendations.   This information is not intended to replace advice given to you by your health care provider. Make sure you discuss any questions you have with your health care provider.   Document Released: 07/09/2001 Document Revised: 06/03/2014 Document Reviewed: 10/08/2010 Elsevier Interactive Patient Education [employer].

## 2015-06-13 NOTE — Assessment & Plan Note (Signed)
Depression screen negative. Health Maintenance reviewed -- Immunizations and colonoscopy up-to-date. Patient s/p bilateral mastectomy. Will follow-up with Oncology for further imaging. Preventive schedule discussed and handout given in AVS. Will obtain fasting labs today.

## 2015-06-13 NOTE — Assessment & Plan Note (Signed)
With osteopenia. Will check Calcium and Vitamin D today. Continue supplements as directed. Will repeat DEXA later this year.

## 2015-06-14 ENCOUNTER — Ambulatory Visit (AMBULATORY_SURGERY_CENTER): Payer: Self-pay

## 2015-06-14 VITALS — Ht 67.0 in | Wt 187.0 lb

## 2015-06-14 DIAGNOSIS — Z83719 Family history of colon polyps, unspecified: Secondary | ICD-10-CM

## 2015-06-14 DIAGNOSIS — Z8371 Family history of colonic polyps: Secondary | ICD-10-CM

## 2015-06-14 NOTE — Progress Notes (Signed)
No allergies to eggs or soy No diet/weight loss meds No home oxygen No past problems with anesthesia except hard to wake up with general anesthesia  Has email and internet; refused emmi

## 2015-06-28 ENCOUNTER — Encounter: Payer: Self-pay | Admitting: Internal Medicine

## 2015-06-28 ENCOUNTER — Ambulatory Visit (AMBULATORY_SURGERY_CENTER): Payer: Medicare HMO | Admitting: Internal Medicine

## 2015-06-28 VITALS — BP 167/92 | HR 66 | Temp 97.1°F | Resp 20 | Ht 67.0 in | Wt 187.0 lb

## 2015-06-28 DIAGNOSIS — Z1589 Genetic susceptibility to other disease: Secondary | ICD-10-CM | POA: Diagnosis not present

## 2015-06-28 DIAGNOSIS — D123 Benign neoplasm of transverse colon: Secondary | ICD-10-CM | POA: Diagnosis not present

## 2015-06-28 DIAGNOSIS — Z800 Family history of malignant neoplasm of digestive organs: Secondary | ICD-10-CM | POA: Diagnosis not present

## 2015-06-28 DIAGNOSIS — Z1211 Encounter for screening for malignant neoplasm of colon: Secondary | ICD-10-CM

## 2015-06-28 MED ORDER — SODIUM CHLORIDE 0.9 % IV SOLN: 500.0000 mL | INTRAVENOUS | Status: DC

## 2015-06-28 NOTE — Op Note (Signed)
[place]  [address] [place], [id number]   COLONOSCOPY PROCEDURE REPORT  PATIENT: [name], [name]  MR#: [id number] BIRTHDATE: [birth year] , 38  yrs. old GENDER: female ENDOSCOPIST: Gatha Mayer, MD, [facility] PROCEDURE DATE:  06/28/2015 PROCEDURE:   Colonoscopy, screening and Colonoscopy with biopsy First Screening Colonoscopy - Avg.  risk and is 50 yrs.  old or older - No.  Prior Negative Screening - Now for repeat screening. Less than 10 yrs Prior Negative Screening - Now for repeat screening.  Above average risk  History of Adenoma - Now for follow-up colonoscopy & has been > or = to 3 yrs.  N/A  Polyps removed today? 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 MEDICATIONS: Propofol 200 mg IV and Monitored anesthesia care  DESCRIPTION OF PROCEDURE:   After the risks benefits and alternatives of the procedure were thoroughly explained, informed consent was obtained.  The digital rectal exam revealed no abnormalities of the rectum.   The [id number]  endoscope was introduced through the anus and advanced to the cecum, which was identified by both the appendix and ileocecal valve. No adverse events experienced.   The quality of the prep was good.  (MiraLax was used)  The instrument was then slowly withdrawn as the colon was fully examined. Estimated blood loss is zero unless otherwise noted in this procedure report.      COLON FINDINGS: A polypoid shaped sessile polyp measuring 2 mm in size was found in the transverse colon.  A polypectomy was performed with cold forceps.   The examination was otherwise normal.  Retroflexed views revealed no abnormalities. The time to cecum = 3.8 Withdrawal time = 10.0   The scope was withdrawn and the procedure completed. COMPLICATIONS: There were no immediate complications.  ENDOSCOPIC IMPRESSION: 1.   Sessile polyp was found in  the transverse colon; polypectomy was performed with cold forceps 2.   The examination was otherwise normal  RECOMMENDATIONS: Timing of repeat colonoscopy will be determined by pathology findings.  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

## 2015-06-28 NOTE — Progress Notes (Signed)
Report to PACU, RN, vss, BBS= Clear.  

## 2015-06-28 NOTE — Assessment & Plan Note (Signed)
Colonoscopy 06/28/2015

## 2015-06-28 NOTE — Patient Instructions (Addendum)
I found and removed one tiny polyp. I will let you know pathology results and when to have another routine colonoscopy by mail. I anticipate 5 years for next exam.  I appreciate the opportunity to care for you. Gatha Mayer, MD, [facility]  Discharge instructions given. Handout on polyps. Resume previous medications. YOU HAD AN ENDOSCOPIC PROCEDURE TODAY AT [facility]:   Refer to the procedure report that was given to you for any specific questions about what was found during the examination.  If the procedure report does not answer your questions, please call your gastroenterologist to clarify.  If you requested that your care partner not be given the details of your procedure findings, then the procedure report has been included in a sealed envelope for you to review at your convenience later.  YOU SHOULD EXPECT: Some feelings of bloating in the abdomen. Passage of more gas than usual.  Walking can help get rid of the air that was put into your GI tract during the procedure and reduce the bloating. If you had a lower endoscopy (such as a colonoscopy or flexible sigmoidoscopy) you may notice spotting of blood in your stool or on the toilet paper. If you underwent a bowel prep for your procedure, you may not have a normal bowel movement for a few days.  Please Note:  You might notice some irritation and congestion in your nose or some drainage.  This is from the oxygen used during your procedure.  There is no need for concern and it should clear up in a day or so.  SYMPTOMS TO REPORT IMMEDIATELY:   Following lower endoscopy (colonoscopy or flexible sigmoidoscopy):  Excessive amounts of blood in the stool  Significant tenderness or worsening of abdominal pains  Swelling of the abdomen that is new, acute  Fever of 100F or higher  For urgent or emergent issues, a gastroenterologist can be reached at any hour by calling [phone].   DIET: Your first meal  following the procedure should be a small meal and then it is ok to progress to your normal diet. Heavy or fried foods are harder to digest and may make you feel nauseous or bloated.  Likewise, meals heavy in dairy and vegetables can increase bloating.  Drink plenty of fluids but you should avoid alcoholic beverages for 24 hours.  ACTIVITY:  You should plan to take it easy for the rest of today and you should NOT DRIVE or use heavy machinery until tomorrow (because of the sedation medicines used during the test).    FOLLOW UP: Our staff will call the number listed on your records the next business day following your procedure to check on you and address any questions or concerns that you may have regarding the information given to you following your procedure. If we do not reach you, we will leave a message.  However, if you are feeling well and you are not experiencing any problems, there is no need to return our call.  We will assume that you have returned to your regular daily activities without incident.  If any biopsies were taken you will be contacted by phone or by letter within the next 1-3 weeks.  Please call us at [phone] if you have not heard about the biopsies in 3 weeks.    SIGNATURES/CONFIDENTIALITY: You and/or your care partner have signed paperwork which will be entered into your electronic medical record.  These signatures attest to the fact that that the  information above on your After Visit Summary has been reviewed and is understood.  Full responsibility of the confidentiality of this discharge information lies with you and/or your care-partner.

## 2015-06-28 NOTE — Progress Notes (Signed)
Called to room to assist during endoscopic procedure.  Patient ID and intended procedure confirmed with present staff. Received instructions for my participation in the procedure from the performing physician.  

## 2015-06-29 ENCOUNTER — Telehealth: Payer: Self-pay

## 2015-06-29 NOTE — Telephone Encounter (Signed)
  Follow up Call-  Call back number 06/28/2015 10/18/2014  Post procedure Call Back phone  # [phone]  Permission to leave phone message Yes Yes     Patient questions:  Do you have a fever, pain , or abdominal swelling? No. Pain Score  0 *  Have you tolerated food without any problems? Yes.    Have you been able to return to your normal activities? Yes.    Do you have any questions about your discharge instructions: Diet   No. Medications  No. Follow up visit  No.  Do you have questions or concerns about your Care? No.  Actions: * If pain score is 4 or above: No action needed, pain <4.

## 2015-07-03 ENCOUNTER — Encounter: Payer: Self-pay | Admitting: Internal Medicine

## 2015-07-03 DIAGNOSIS — Z86010 Personal history of colonic polyps: Secondary | ICD-10-CM

## 2015-07-03 DIAGNOSIS — Z860101 Personal history of adenomatous and serrated colon polyps: Secondary | ICD-10-CM | POA: Insufficient documentation

## 2015-07-03 HISTORY — DX: Personal history of colonic polyps: Z86.010

## 2015-07-03 HISTORY — DX: Personal history of adenomatous and serrated colon polyps: Z86.0101

## 2015-07-03 NOTE — Progress Notes (Signed)
Quick Note:  Adenoma - recall colonoscopy 2022 ______

## 2015-07-07 ENCOUNTER — Telehealth: Payer: Self-pay | Admitting: Genetic Counselor

## 2015-07-07 NOTE — Telephone Encounter (Signed)
Patient called looking for a copy of her test results.  HEr granddaughter is going in for a biopsy.  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  Instead it can increase the risk for colon cancer.  I sent her a copy of the results via secure email.

## 2015-07-12 MED FILL — TAMOXIFEN 20 MG TABLET: 20 | 90 days supply | Qty: 90 | Fill #1

## 2015-07-14 ENCOUNTER — Telehealth: Payer: Self-pay | Admitting: Physician Assistant

## 2015-07-14 MED ORDER — CITALOPRAM HYDROBROMIDE 10 MG PO TABS: 10.0000 mg | ORAL_TABLET | Freq: Every day | ORAL | Status: DC

## 2015-07-14 NOTE — Telephone Encounter (Signed)
Please call to get more information

## 2015-07-14 NOTE — Telephone Encounter (Signed)
Caller name: Self  Can be reached: [phone]   Reason for call: Patient request call back about a medication that she had discussed with [facility].

## 2015-07-14 NOTE — Telephone Encounter (Signed)
Yes I am absolutely ok with that. We had discussed this before but per her, the oncologist wanted her to continue the Prozac since she was doing well. We will stop the Prozac immediately and send in an Rx for citalopram 10 mg.   Spoke with patient. Rx sent. She will follow-up in a couple of weeks. We will likely need to increase dosage.

## 2015-07-14 NOTE — Telephone Encounter (Signed)
Called and spoke with the pt and she stated that she's requesting to discontinue the Prozac and asking to go back on what she was on before starting the Prozac.  She stated that she had a colonoscopy done and they find a polyp.  Pt feels she needs her Tamoxifen to be as stronger as it can.    Looked and it looked as if she was on Celexa.  Please advise.//AB/CMA

## 2015-07-24 MED FILL — CITALOPRAM HBR 10 MG TABLET: 10 | 30 days supply | Qty: 30 | Fill #0

## 2015-07-25 ENCOUNTER — Ambulatory Visit (HOSPITAL_BASED_OUTPATIENT_CLINIC_OR_DEPARTMENT_OTHER): Payer: Medicare HMO | Admitting: Oncology

## 2015-07-25 ENCOUNTER — Telehealth: Payer: Self-pay | Admitting: Oncology

## 2015-07-25 ENCOUNTER — Other Ambulatory Visit (HOSPITAL_BASED_OUTPATIENT_CLINIC_OR_DEPARTMENT_OTHER): Payer: Medicare HMO

## 2015-07-25 VITALS — BP 155/75 | HR 76 | Temp 98.3°F | Resp 18 | Ht 67.0 in | Wt 187.7 lb

## 2015-07-25 DIAGNOSIS — N951 Menopausal and female climacteric states: Secondary | ICD-10-CM

## 2015-07-25 DIAGNOSIS — R61 Generalized hyperhidrosis: Secondary | ICD-10-CM

## 2015-07-25 DIAGNOSIS — C50412 Malignant neoplasm of upper-outer quadrant of left female breast: Secondary | ICD-10-CM

## 2015-07-25 DIAGNOSIS — Z853 Personal history of malignant neoplasm of breast: Secondary | ICD-10-CM | POA: Diagnosis not present

## 2015-07-25 DIAGNOSIS — C50912 Malignant neoplasm of unspecified site of left female breast: Secondary | ICD-10-CM

## 2015-07-25 LAB — COMPREHENSIVE METABOLIC PANEL
ALBUMIN: 3.9 g/dL (ref 3.5–5.0)
ALK PHOS: 61 U/L (ref 40–150)
ALT: 22 U/L (ref 0–55)
AST: 25 U/L (ref 5–34)
Anion Gap: 9 mEq/L (ref 3–11)
BILIRUBIN TOTAL: 0.48 mg/dL (ref 0.20–1.20)
BUN: 15.9 mg/dL (ref 7.0–26.0)
CO2: 25 mEq/L (ref 22–29)
Calcium: 9.1 mg/dL (ref 8.4–10.4)
Chloride: 109 mEq/L (ref 98–109)
Creatinine: 1 mg/dL (ref 0.6–1.1)
EGFR: 60 mL/min/{1.73_m2} — ABNORMAL LOW (ref >90)
GLUCOSE: 80 mg/dL (ref 70–140)
Potassium: 4.1 mEq/L (ref 3.5–5.1)
SODIUM: 143 meq/L (ref 136–145)
TOTAL PROTEIN: 6.8 g/dL (ref 6.4–8.3)

## 2015-07-25 LAB — CBC WITH DIFFERENTIAL/PLATELET
BASO%: 1.1 % (ref 0.0–2.0)
Basophils Absolute: 0.1 10*3/uL (ref 0.0–0.1)
EOS ABS: 0.3 10*3/uL (ref 0.0–0.5)
EOS%: 4.7 % (ref 0.0–7.0)
HCT: 41.5 % (ref 34.8–46.6)
HEMOGLOBIN: 13.8 g/dL (ref 11.6–15.9)
LYMPH%: 24.1 % (ref 14.0–49.7)
MCH: 30.5 pg (ref 25.1–34.0)
MCHC: 33.3 g/dL (ref 31.5–36.0)
MCV: 91.6 fL (ref 79.5–101.0)
MONO#: 0.4 10*3/uL (ref 0.1–0.9)
MONO%: 6.4 % (ref 0.0–14.0)
NEUT%: 63.7 % (ref 38.4–76.8)
NEUTROS ABS: 3.9 10*3/uL (ref 1.5–6.5)
Platelets: 184 10*3/uL (ref 145–400)
RBC: 4.53 10*6/uL (ref 3.70–5.45)
RDW: 13.6 % (ref 11.2–14.5)
WBC: 6.1 10*3/uL (ref 3.9–10.3)
lymph#: 1.5 10*3/uL (ref 0.9–3.3)

## 2015-07-25 MED ORDER — TAMOXIFEN CITRATE 20 MG PO TABS: 20.0000 mg | ORAL_TABLET | Freq: Every day | ORAL | Status: DC

## 2015-07-25 NOTE — Progress Notes (Signed)
[facility]  [phone] [phone]     ID: JASILYN HOLDERMAN DOB: 17-Jan-1946  MR#: [id number]  AOZ#:[id number]  Patient Care Team: Brunetta Jeans, PA-C as PCP - General (Physician Assistant) Josue Hector, MD (Cardiology) Cheri Fowler, MD as Attending Physician (Obstetrics and Gynecology) Chauncey Cruel, MD (Hematology and Oncology) Gatha Mayer, MD as Consulting Physician (Gastroenterology) Iona Beard, MD as Referring Physician (Optometry) Autumn Messing III, MD as Consulting Physician (General Surgery) Ardis Hughs, MD as Attending Physician (Urology) Charolotte Capuchin, MD as Consulting Physician (General Practice) PCP: Leeanne Rio, PA-C GYN: SU: Star Age MD, Stevphen Rochester MD OTHER MD: Crissie Reese M.D. , Nena Polio M.D., Silvano Rusk M.D., Stevphen Rochester MD, Sherren Mocha McDiarmid MD  CHIEF COMPLAINT: Bilateral breast cancers  CURRENT TREATMENT: Tamoxifen    Breast cancer of upper-outer quadrant of left female breast ([place])   05/09/2014 Initial Diagnosis Left breast  upper-outer quadrant biopsy shows an invasive breast cancer with lobular features, grade 1, estrogen receptor 100% positive, progesterone receptor 100% positive, with an MIB-1 of 8% and no HER-2 amplification    Breast cancer ([place])   09/08/2014 Initial Diagnosis Breast cancer    HISTORY OF BREAST CANCER From the original intake note:  Kailie Polus, now [name], was my patient in 1996, at which time she had an early stage breast cancer treated with mastectomy with TRAM flap reconstruction followed by CMF chemotherapy 8. She did not receive radiation or anti-estrogens. She was released from follow-up early this sensory.  More recently Creola had routine left screening mammography with tomography at the breast Center 04/27/2014. Breast density was category B. A possible mass in the left breast was noted and on 05/09/2014 she underwent diagnostic left mammography  and ultrasonography showing a 9 mm spiculated asymmetry in the left breast upper outer quadrant. This was not palpable. Ultrasound identified a 1.0 cm irregular hypoechoic lesion in that area. Ultrasound of the left axilla was negative.  Biopsy of the left breast mass 05/09/2014 showed ([id number]) an invasive breast cancer with lobular features, grade 1. The tumor was estrogen receptor 100% positive, progesterone receptor 99% positive, both with strong staining intensity. The MIB-1 was 8%. HER-2 was not amplified, the signals ratio being 1.10 and the number per cell 1.70.  On 05/17/2014 the patient underwent bilateral breast MRI. This showed breast composition category C. In the upper outer quadrant of the left breast there was stippled non-masslike enhancement measuring 3.9 cm. Within this region there was an area of slightly increased consolidation corresponding to the smaller mass noted on ultrasonography. There were no abnormal appearing lymph nodes in the right breast was unremarkable.  Alylah's case was presented at the breast cancer multidisciplinary conference 05/18/2014. The patient is a candidate for breast conservation, and if she opts for that her surgeon may wish to do a generous lumpectomy or proceed to biopsy of the more extensive area of stippled non-masslike enhancement. It was felt that since the patient had a mastectomy on the right she might choose a mastectomy on the left. An Oncotype test was also recommended.  Sherlyn was evaluated at the breast clinic 05/18/2014. Her subsequent history is as detailed below  [place] returns today for follow-up of her estrogen receptor positive breast cancer.she continues on tamoxifen, with generally good tolerance. She has some hot flashes and some night sweats. She sleeps well however. She doesn't have any vaginal wetness from this. She obtains a drug at a  good price. She has not had any further problems with hair loss  Since  the last visit here she also had further reconstruction at [facility]. Things did not go quite as well as planned and while things may be a little bit better (for one thing her left breast doesn't flip around as much as before) she is not as satisfied as she hopes she would be. She had a nipple reconstruction that "fell off" . Aside from that she has some pain in her knees and back, which is not more intense or persistent than before. She feels tired, but she is working full-time trying to work towards retirement. 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 She bruises easily. She feels a little bit forgetful.  Also since her last visit here she had a colonoscopy, which showed a sessile adenoma ([id number]); she has a repeat colonoscopy scheduled in 5 years.  REVIEW OF SYSTEMS: Aside from the problems just noted, a detailed review of systems today was noncontributory.  Allergies  Allergen Reactions  . Silodosin Rash    Facial rash  . Ace Inhibitors Other (See Comments)    REACTION: angioedema  . Buprenorphine Hcl Other (See Comments)    "crazy"  . Morphine And Related Other (See Comments)    "crazy"  . Codeine Rash  . Sulfonamide Derivatives Rash  . Telmisartan Rash    Current Outpatient Prescriptions  Medication Sig Dispense Refill  . Cholecalciferol (VITAMIN D3) 1000 UNITS CAPS Take 1 capsule by mouth daily.      . citalopram (CELEXA) 10 MG tablet Take 1 tablet (10 mg total) by mouth daily. 30 tablet 1  . Cyanocobalamin (VITAMIN B-12 CR) 1000 MCG TBCR Take 1 tablet by mouth daily.      . furosemide (LASIX) 40 MG tablet TAKE 1 TABLET (40 MG TOTAL) BY MOUTH DAILY. 90 tablet 0  . OVER THE COUNTER MEDICATION Probiotic 90 billion-Take 1 tablet by mouth daily.    . tamoxifen (NOLVADEX) 20 MG tablet Take 20 mg by mouth daily.    . [DISCONTINUED] sucralfate (CARAFATE) 1 GM/10ML suspension Take 1 g by mouth at bedtime as needed.       No  current facility-administered medications for this visit.    PAST MEDICAL HISTORY: Past Medical History  Diagnosis Date  . GERD (gastroesophageal reflux disease)   . Depression   . Vitamin D deficiency   . Vitamin B12 deficiency   . Lichen sclerosus   . Family history of breast cancer   . Family history of colon cancer   . Family history of pancreatic cancer   . Dyspnea     Normal Spirometry 03/2008 EF 65% BNP normal 11/2007  . Blood dyscrasia     bruises and bleed easily  . History of hiatal hernia   . Arthritis     "spine" (07/28/2014)  . Chronic back pain     "all over"  . Mild anxiety   . Gastric polyps   . Kidney stone     right kidney   . Breast cancer, right breast ([place]) 1995  . Melanoma ([place]) 2010    "right elbow; [facility] at [facility]"  . Breast cancer, left breast ([place]) 07/28/2014  . Complication of anesthesia     went to sleep easily but hard to wake up up until elbow OR in 2010  . Hx of adenomatous polyp of colon 07/03/2015    PAST SURGICAL HISTORY:  Past Surgical History  Procedure Laterality Date  . Temporomandibular joint surgery Bilateral 1987  . Nissen fundoplication  17/5102  . Melanoma excision Right 2010    From elbow-- Done at [facility]   . Tonsillectomy    . Colonoscopy      Dr Sharlett Iles  . Reconstruction breast immediate / delayed w/ tissue expander Left 07/28/2014  . Hernia repair    . Breast biopsy Left 04/2014  . Mastectomy Right 1996     chemotherapy. pt. states 13 lymph nodes were removed  . Mastectomy complete / simple w/ sentinel node biopsy Left 07/28/2014  . Bunionectomy Bilateral 1970's  . Mastectomy w/ sentinel node biopsy Left 07/28/2014    Procedure: LEFT MASTECTOMY WITH SENTINEL LYMPH NODE MAPPING;  Surgeon: Autumn Messing III, MD;  Location: [facility];  Service: General;  Laterality: Left;  . Breast reconstruction with placement of tissue expander and flex hd (acellular hydrated dermis) Left 07/28/2014    Procedure: LEFT BREAST RECONSTRUCTION PLACEMENT OF  LEFT TISSUE EXPANDER ;  Surgeon: Crissie Reese, MD;  Location: [facility];  Service: Plastics;  Laterality: Left;  . Breast reconstruction with placement of tissue expander and flex hd (acellular hydrated dermis) Left 09/08/2014    Procedure: REMOVAL OF TISSUE EXPANDER FROM LEFT BREAST;  Surgeon: Crissie Reese, MD;  Location: [facility];  Service: Plastics;  Laterality: Left;  . Latissimus flap to breast Left 09/08/2014    Procedure: LEFT LATISSIMUS FLAP TO BREAST WITH SALINE IMPLANT FOR BREAST RECONSTRUCTION;  Surgeon: Crissie Reese, MD;  Location: [facility];  Service: Plastics;  Laterality: Left;  . Esophagogastroduodenoscopy    . Cystoscopy with retrograde pyelogram, ureteroscopy and stent placement Right 06/09/2015    Procedure: CYSTOSCOPY WITH RIGHT RETROGRADE PYELOGRAM, RIGHT URETEROSCOPY AND RIGHT URTERAL STENT PLACEMENT;  Surgeon: Ardis Hughs, MD;  Location: [facility];  Service: Urology;  Laterality: Right;  . Holmium laser application Right 5/85/2778    Procedure: HOLMIUM LASER APPLICATION;  Surgeon: Ardis Hughs, MD;  Location: [facility];  Service: Urology;  Laterality: Right;    FAMILY HISTORY Family History  Problem Relation Age of Onset  . Stroke Other     F 1st degree relative 10, M 1st degree relative  . Colon cancer Cousin 50    double first cousin  . Colon cancer Cousin 57    double first cousin  . Colon polyps Father     between 8-20  . Dementia Father   . Pancreatic cancer Brother 66  . Colon polyps Brother     between 10-20  . Cancer Paternal Uncle     NOS  . Anemia Paternal Grandfather     pernicious anemia  . Breast cancer Cousin 65    double first cousin  . Breast cancer Cousin 10    paternal cousin   the patient's father died at age 47 in the setting of Alzheimer's disease. The patient's mother died at age 18 following a stroke. Warren has 3 brothers, no sisters. Texie's niece, Kela Millin, was my patient with a history of breast cancer diagnosed at age 68. She  succumbed to that tumor. The patient also has 2 paternal cousins with breast cancer diagnosed at age 74 and age 46. One brother has prostate cancer diagnosed at age 52. The other brother has a history of throat cancer diagnosed at age 10. There is no other history of breast or ovarian cancer in the family to her knowledge.  GENETICS TESTING:  MUTYH mutation, normal BRCA genes  GYNECOLOGIC HISTORY:  No LMP recorded. Patient is not currently having periods (Reason: Chemotherapy). Menarche age 17, first live birth age 46. She is GX P2. She went through menopause in 1996 at the time of her chemotherapy. She did not take hormone replacement. 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 Her husband of 3 years, Marlou Sa, is a Gaffer at [employer]. He matches colors for a [employer]. Sarahelizabeth has 2 children from a prior marriage, Becky Sax, 70 years old, lives in [place] and is a homemaker; and Corene Cornea, 38, who lives in [place] and works as a Freight forwarder. Varone has 4 biological grandchildren. Marlou Sa has a son, Mitzi Hansen, who lives in [place] and worse at [employer]. Marlou Sa has 4 grandchildren of his. The patient is a Psychologist, forensic.    ADVANCED DIRECTIVES: In place; currently Janeva's children Becky Sax and Corene Cornea are co- healthcare powers of attorney. Corene Cornea can be reached at [place]: Social History  Substance Use Topics  . Smoking status: Never Smoker   . Smokeless tobacco: Never Used     Comment: Regular Exercise - Yes  . Alcohol Use: No     Colonoscopy: FEB 2017  PAP: 2013  Bone density: Remote  Lipid panel:    OBJECTIVE: Middle-aged white woman who appears well Filed Vitals:   07/25/15 1157  BP: 155/75  Pulse: 76  Temp: 98.3 F (36.8 C)  Resp: 18     Body mass index is 29.39 kg/(m^2).    ECOG FS:0 -  Asymptomatic  Sclerae unicteric, EOMs intact Oropharynx clear, dentition in good repair No cervical or supraclavicular adenopathy Lungs no rales or rhonchi Heart regular rate and rhythm Abd soft, nontender, positive bowel sounds MSK no focal spinal tenderness, no upper extremity lymphedema Neuro: nonfocal, well oriented, appropriate affect Breasts: status post bilateral mastectomies with bilateral reconstruction. There is no evidence of chest wall recurrence. Both axillae are benign.   LAB RESULTS:  CMP     Component Value Date/Time   NA 143 06/13/2015 0830   NA 146* 01/24/2015 1237   K 3.9 06/13/2015 0830   K 3.9 01/24/2015 1237   CL 105 06/13/2015 0830   CO2 34* 06/13/2015 0830   CO2 30* 01/24/2015 1237   GLUCOSE 94 06/13/2015 0830   GLUCOSE 117 01/24/2015 1237   BUN 18 06/13/2015 0830   BUN 15.0 01/24/2015 1237   CREATININE 1.08 06/13/2015 0830   CREATININE 1.1 01/24/2015 1237   CREATININE 0.93 12/23/2014 1616   CALCIUM 8.9 06/13/2015 0830   CALCIUM 9.4 01/24/2015 1237   PROT 6.4 06/13/2015 0830   PROT 6.5 01/24/2015 1237   ALBUMIN 3.7 06/13/2015 0830   ALBUMIN 3.7 01/24/2015 1237   AST 20 06/13/2015 0830   AST 34 01/24/2015 1237   ALT 21 06/13/2015 0830   ALT 41 01/24/2015 1237   ALKPHOS 49 06/13/2015 0830   ALKPHOS 69 01/24/2015 1237   BILITOT 0.4 06/13/2015 0830   BILITOT 0.34 01/24/2015 1237   GFRNONAA 54* 06/07/2015 0905   GFRAA >60 06/07/2015 0905    INo results found for: SPEP, UPEP  Lab Results  Component Value Date   WBC 6.1 07/25/2015   NEUTROABS 3.9 07/25/2015   HGB 13.8 07/25/2015   HCT 41.5 07/25/2015   MCV 91.6 07/25/2015   PLT 184 07/25/2015      Chemistry      Component  Value Date/Time   NA 143 06/13/2015 0830   NA 146* 01/24/2015 1237   K 3.9 06/13/2015 0830   K 3.9 01/24/2015 1237   CL 105 06/13/2015 0830   CO2 34* 06/13/2015 0830   CO2 30* 01/24/2015 1237   BUN 18 06/13/2015 0830   BUN 15.0 01/24/2015 1237   CREATININE  1.08 06/13/2015 0830   CREATININE 1.1 01/24/2015 1237   CREATININE 0.93 12/23/2014 1616      Component Value Date/Time   CALCIUM 8.9 06/13/2015 0830   CALCIUM 9.4 01/24/2015 1237   ALKPHOS 49 06/13/2015 0830   ALKPHOS 69 01/24/2015 1237   AST 20 06/13/2015 0830   AST 34 01/24/2015 1237   ALT 21 06/13/2015 0830   ALT 41 01/24/2015 1237   BILITOT 0.4 06/13/2015 0830   BILITOT 0.34 01/24/2015 1237       No results found for: LABCA2  No components found for: LABCA125  No results for input(s): INR in the last 168 hours.  Urinalysis    Component Value Date/Time   COLORURINE YELLOW 06/13/2015 0830   APPEARANCEUR CLEAR 06/13/2015 0830   LABSPEC 1.025 06/13/2015 0830   PHURINE 6.0 06/13/2015 0830   GLUCOSEU NEGATIVE 06/13/2015 0830   GLUCOSEU NEGATIVE 08/28/2014 1438   HGBUR LARGE* 06/13/2015 0830   BILIRUBINUR NEGATIVE 06/13/2015 0830   BILIRUBINUR neg 12/23/2014 1734   KETONESUR NEGATIVE 06/13/2015 0830   PROTEINUR 2+ 12/23/2014 1734   PROTEINUR NEGATIVE 08/28/2014 1438   UROBILINOGEN 1.0 06/13/2015 0830   UROBILINOGEN 1.0 12/23/2014 1734   NITRITE NEGATIVE 06/13/2015 0830   NITRITE neg 12/23/2014 1734   LEUKOCYTESUR NEGATIVE 06/13/2015 0830    RADIOLOGY AND OTHER STUDIES:  ASSESSMENT: 71 y.o. [place] woman status post left breast biopsy 05/09/2014 for a clinical T1-T2, N0 (stage I-II) invasive lobular breast cancer, estrogen receptor 100% positive, progesterone receptor 99% positive, with an MIB-1 of 8%, and no HER-2 amplification.  (1) history of right modified radical mastectomy with TRAM reconstruction 1996, followed by chemotherapy for 6 months; did not receive antiestrogen therapy or radiation therapy adjuvantly  (2) tamoxifen started neoadjuvantly 05/18/2014  (3) status post left mastectomy and sentinel lymph node sampling 07/28/2014 for a pT1c pN0(i+). Stage IA invasive ductal carcinoma, grade 1, with repeat HER-2 again negative. Margins were ample  (4)  Oncotype score of 3 predicts an outside the breast risk of recurrence within 10 years of 4% if the patient's only systemic treatment is tamoxifen for 5 years. It also predicts no significant benefit from chemotherapy.  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 Sequencing and rearrangement analysis for the following 32 genes found no deleterious mutations: APC, ATM, BARD1, BMPR1A, BRCA1, BRCA2, BRIP1, CDH1, CDK4, CDKN2A, CHEK2, EPCAM, GREM1, MLH1, MRE11A, MSH2, MSH6, MUTYH, NBN, NF1, PALB2, PMS2, POLD1, POLE, PTEN, RAD50, RAD51D, SMAD4, SMARCA4, STK11, and TP53.  (a) Heterozygous MUTYH mutations are not associated with an increased risk for cancer in the patient, however, other family members may be at risk for homozygous mutations and therefore family testing should be considered.   PLAN: Courney is still not completely satisfied with her reconstruction--especially with the nipple falling off--but she is done with surgery. She is going to live with what she has. Today I wrote her for a couple of sports bras and also for nipple  prosthesis if she wishes to have one.  She is tolerating the tamoxifen without any unusual side effects. She does not want to try any more drugs for control of hot flashes. She did switch from fluoxetine to cytology gram just in case there is an interaction (there is a metabolic interaction, the question is whether it is clinically significant: the data remains controversial). She is tolerating the cytology from well and she hopes to be able to get off it perhaps after a few months.  She is very busy right now but I have encouraged her as soon as she has a little bit more time to herself to start exercising and in particular [facility] would greatly [facility].  Otherwise she will return to see me in 6 months. She knows to call for any problems that may develop before her next visit here.   Chauncey Cruel, MD   07/25/2015 12:09 PM Medical Oncology and Hematology [facility] [address] [place], [place] [address] Tel. [phone]    Fax. [phone] so you to continue to transition her to surgery isn't as yet okay for sounds and so, what that she missed she birth were doing great thank you very excited that

## 2015-07-25 NOTE — Telephone Encounter (Signed)
Scheduled patient appt per pof, avs report printed.  °

## 2015-07-26 LAB — ANA COMPREHENSIVE PANEL
ENA RNP Ab: <0.2 AI (ref 0.0–0.9)
Smith Antibodies: <0.2 AI (ref 0.0–0.9)
dsDNA Ab: 3 IU/mL (ref 0–9)

## 2015-07-26 LAB — SEDIMENTATION RATE: Sedimentation Rate-Westergren: 11 mm/hr (ref 0–40)

## 2015-08-29 MED FILL — CITALOPRAM HBR 10 MG TABLET: 10 | 30 days supply | Qty: 30 | Fill #1

## 2015-09-13 ENCOUNTER — Other Ambulatory Visit: Payer: Self-pay | Admitting: Physician Assistant

## 2015-09-25 MED FILL — FUROSEMIDE 40 MG TABLET: 40 | 90 days supply | Qty: 90 | Fill #0

## 2015-10-02 MED FILL — CITALOPRAM HBR 10 MG TABLET: 10 | 30 days supply | Qty: 30 | Fill #2

## 2015-10-30 MED FILL — TAMOXIFEN 20 MG TABLET: 20 | 90 days supply | Qty: 90 | Fill #2

## 2015-11-14 ENCOUNTER — Other Ambulatory Visit: Payer: Self-pay | Admitting: Physician Assistant

## 2015-11-14 MED FILL — CITALOPRAM HBR 10 MG TABLET: 10 | 30 days supply | Qty: 30 | Fill #0

## 2015-11-14 NOTE — Telephone Encounter (Signed)
Refill sent per [facility] refill protocol/SLS 06/20

## 2015-12-17 IMAGING — CT CT CHEST W/ CM
2 of 3 series · 15 of 36 positions shown, 18 images · IV contrast (75CC OMNI 300)
Comparison: 02/24/2009, 06/14/2008 and 11/25/2007.

CLINICAL DATA: Right-sided chest pain. History of breast cancer and
melanoma. Prior Nissen fundoplication.

EXAM:
CT CHEST WITH CONTRAST
TECHNIQUE: Multidetector CT imaging of the chest was performed during
intravenous contrast administration.
CONTRAST:  75mL OMNIPAQUE IOHEXOL 300 MG/ML  SOLN

[Series 2: chest with · axial · 0.70mm/px · z∈[-329,-54]mm · 12 of 65 slices shown, 15 images]
[im 5/65  mediastinal]
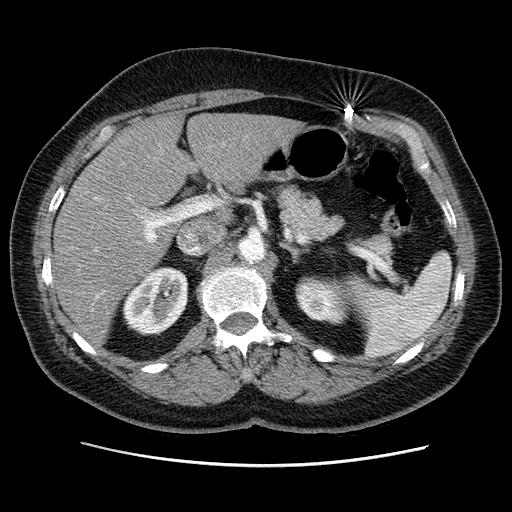
[im 5/65  lung]
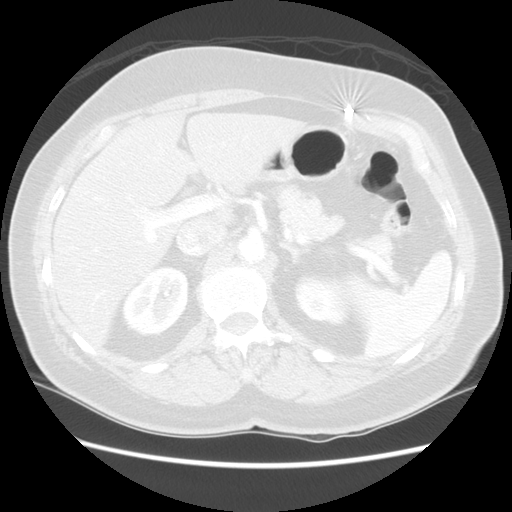
[im 10/65  lung]
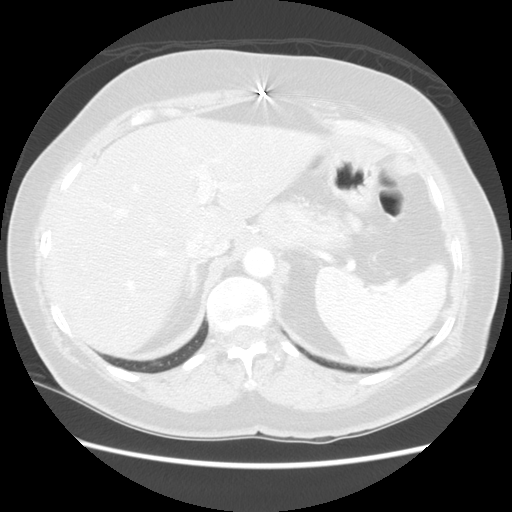
[im 15/65  lung]
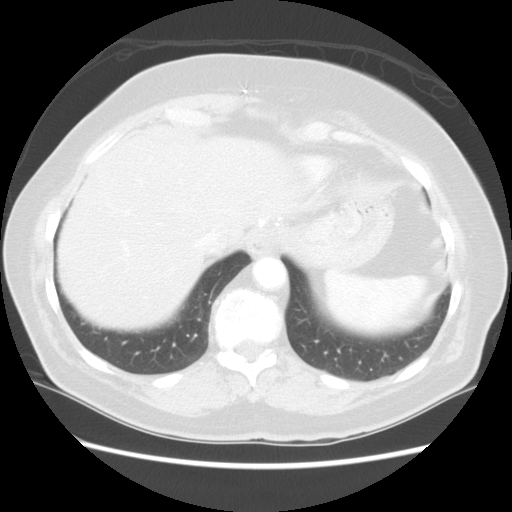
[im 19/65  lung]
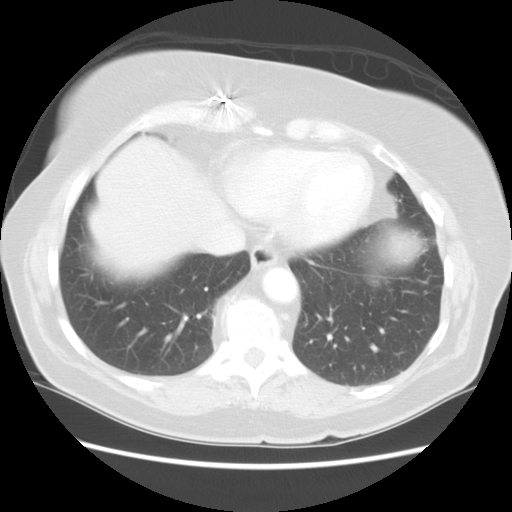
[im 24/65  mediastinal]
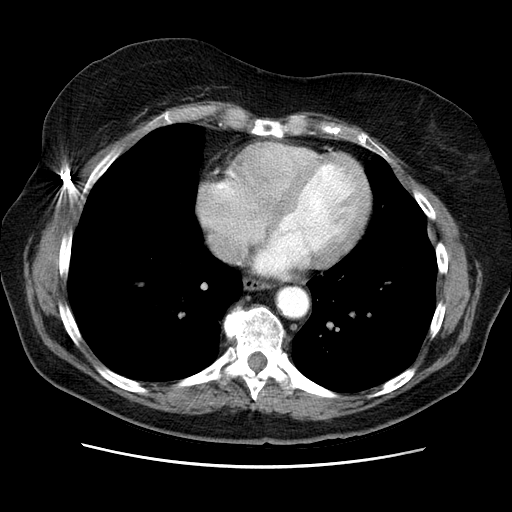
[im 24/65  lung]
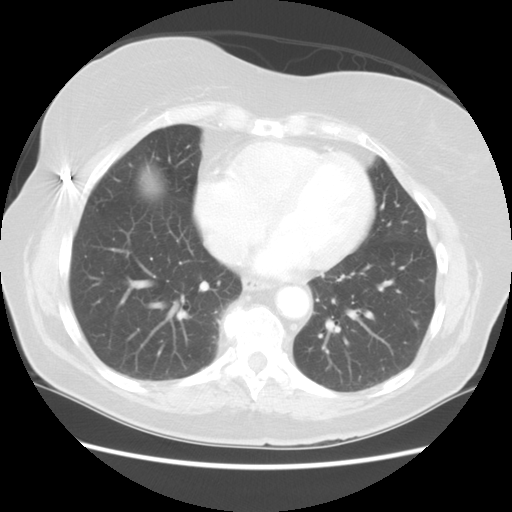
[im 29/65  lung]
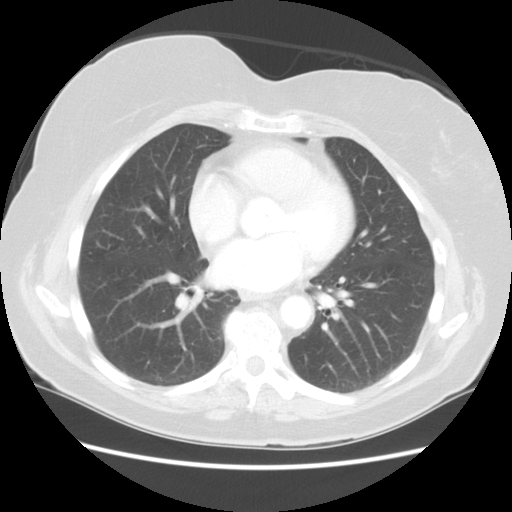
[im 36/65  lung]
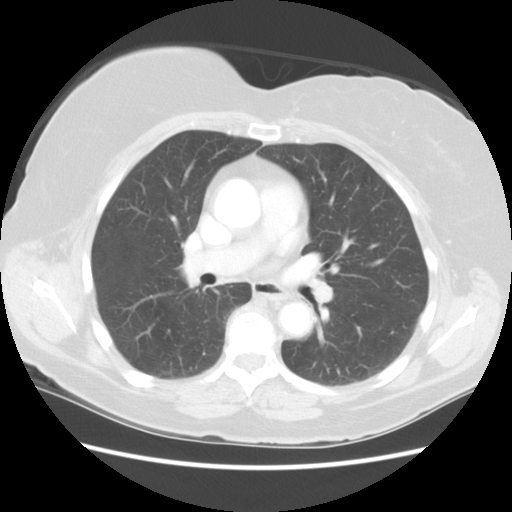
[im 41/65  lung]
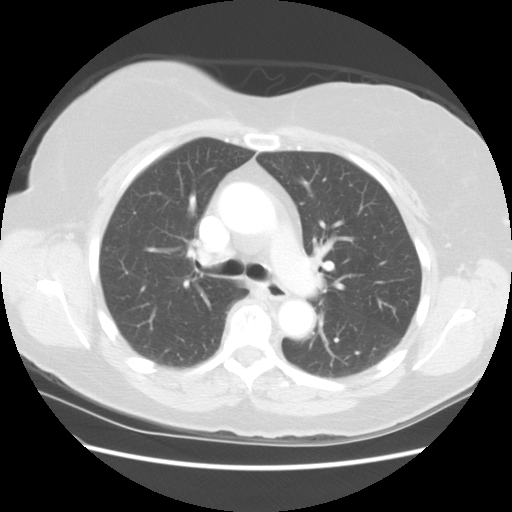
[im 46/65  mediastinal]
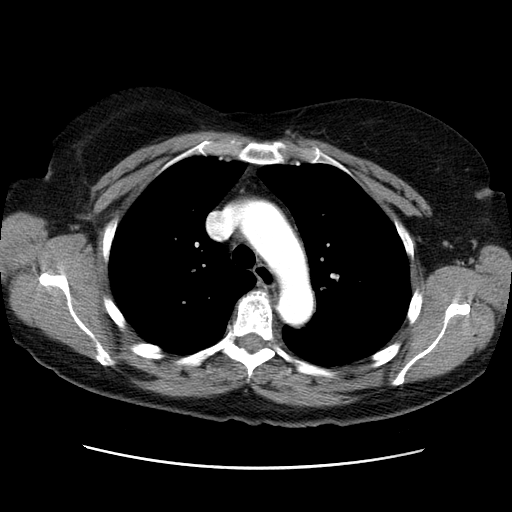
[im 46/65  lung]
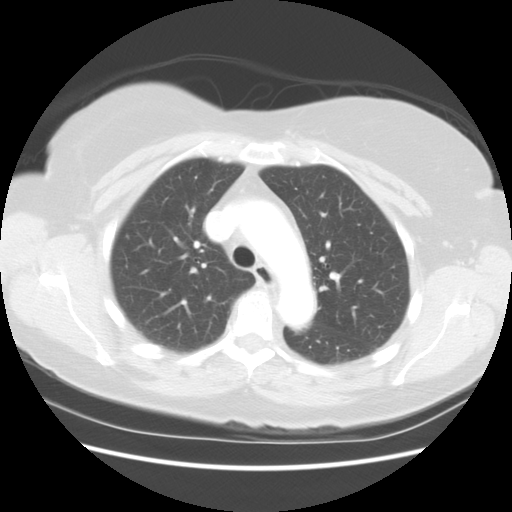
[im 50/65  lung]
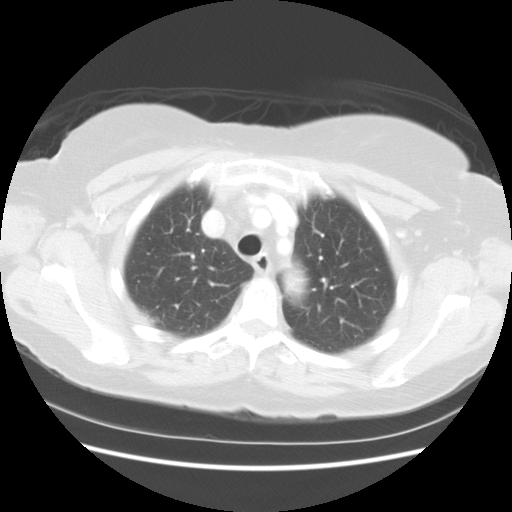
[im 55/65  lung]
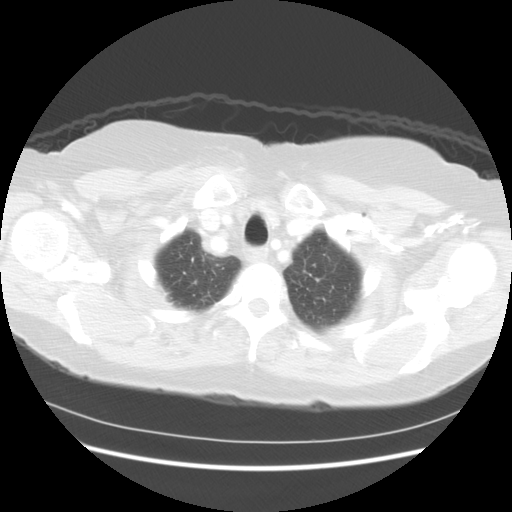
[im 60/65  lung]
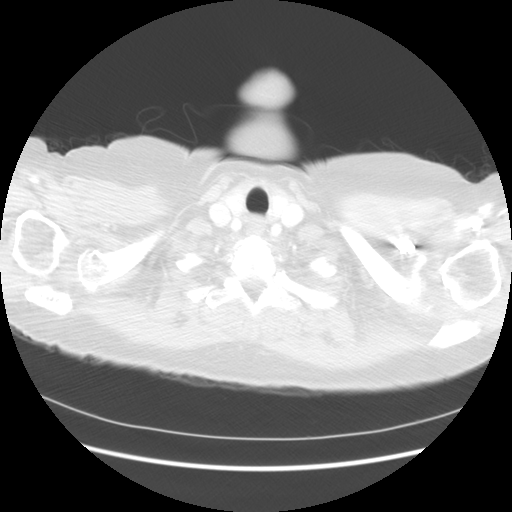

[Series 400: cor · coronal · 0.70mm/px · 3 of 130 slices shown]
[im 26/130  lung]
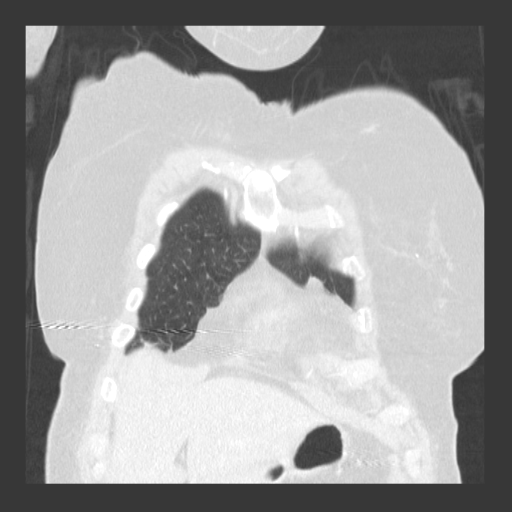
[im 52/130  lung]
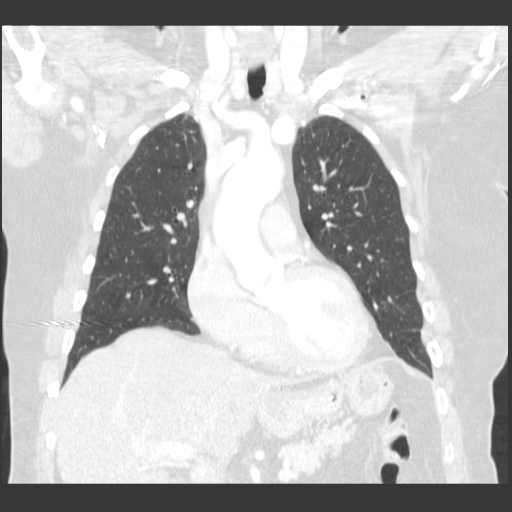
[im 78/130  lung]
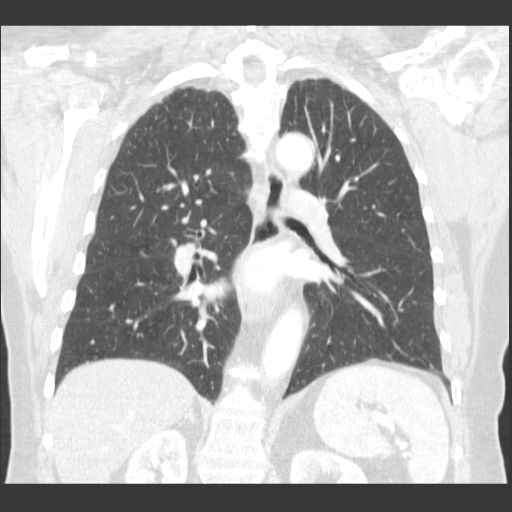

[15 of 36 positions shown; findings below may reference images not displayed]

FINDINGS: There is no evidence of pulmonary edema, consolidation,
pneumothorax, nodule or pleural fluid. The heart size is normal. No
pericardial fluid or thickening identified. Visualized airways are
normally patent.

The thoracic aorta is of normal caliber. No evidence of aortic
dissection. Clips are seen related to prior right breast surgery. No
focal masses or enlarged lymph nodes are identified.

Postoperative changes seen related to prior fundoplication. No
abnormalities identified in the upper abdomen other than hepatic
steatosis.

The thoracic spine shows stable spondylosis and mild scoliosis
without bony lesions or fractures. No rib or chest wall
abnormalities.
IMPRESSION: No significant abnormalities in the chest.

## 2015-12-25 MED FILL — CITALOPRAM HBR 10 MG TABLET: 10 | 30 days supply | Qty: 30 | Fill #1

## 2016-01-12 ENCOUNTER — Other Ambulatory Visit: Payer: Self-pay

## 2016-01-12 DIAGNOSIS — C50412 Malignant neoplasm of upper-outer quadrant of left female breast: Secondary | ICD-10-CM

## 2016-01-15 ENCOUNTER — Other Ambulatory Visit (HOSPITAL_BASED_OUTPATIENT_CLINIC_OR_DEPARTMENT_OTHER): Payer: Medicare HMO

## 2016-01-15 DIAGNOSIS — C50412 Malignant neoplasm of upper-outer quadrant of left female breast: Secondary | ICD-10-CM | POA: Diagnosis not present

## 2016-01-15 LAB — COMPREHENSIVE METABOLIC PANEL
ALBUMIN: 3.6 g/dL (ref 3.5–5.0)
ALT: 21 U/L (ref 0–55)
AST: 25 U/L (ref 5–34)
Alkaline Phosphatase: 48 U/L (ref 40–150)
Anion Gap: 8 mEq/L (ref 3–11)
BUN: 17.5 mg/dL (ref 7.0–26.0)
CHLORIDE: 109 meq/L (ref 98–109)
CO2: 28 mEq/L (ref 22–29)
Calcium: 8.9 mg/dL (ref 8.4–10.4)
Creatinine: 0.9 mg/dL (ref 0.6–1.1)
EGFR: 63 mL/min/{1.73_m2} — ABNORMAL LOW (ref >90)
GLUCOSE: 98 mg/dL (ref 70–140)
POTASSIUM: 3.8 meq/L (ref 3.5–5.1)
SODIUM: 145 meq/L (ref 136–145)
Total Bilirubin: 0.5 mg/dL (ref 0.20–1.20)
Total Protein: 6.5 g/dL (ref 6.4–8.3)

## 2016-01-15 LAB — CBC WITH DIFFERENTIAL/PLATELET
BASO%: 0.7 % (ref 0.0–2.0)
BASOS ABS: 0 10*3/uL (ref 0.0–0.1)
EOS%: 5.1 % (ref 0.0–7.0)
Eosinophils Absolute: 0.3 10*3/uL (ref 0.0–0.5)
HCT: 40 % (ref 34.8–46.6)
HEMOGLOBIN: 13.7 g/dL (ref 11.6–15.9)
LYMPH%: 28.9 % (ref 14.0–49.7)
MCH: 31.9 pg (ref 25.1–34.0)
MCHC: 34.3 g/dL (ref 31.5–36.0)
MCV: 93 fL (ref 79.5–101.0)
MONO#: 0.4 10*3/uL (ref 0.1–0.9)
MONO%: 7.1 % (ref 0.0–14.0)
NEUT#: 3.5 10*3/uL (ref 1.5–6.5)
NEUT%: 58.2 % (ref 38.4–76.8)
Platelets: 155 10*3/uL (ref 145–400)
RBC: 4.3 10*6/uL (ref 3.70–5.45)
RDW: 13.3 % (ref 11.2–14.5)
WBC: 6.1 10*3/uL (ref 3.9–10.3)
lymph#: 1.8 10*3/uL (ref 0.9–3.3)

## 2016-01-16 ENCOUNTER — Telehealth: Payer: Self-pay | Admitting: *Deleted

## 2016-01-16 NOTE — Telephone Encounter (Signed)
"  I left message yesterday because the appointment 01-22-2016 needs to be rescheduled." Call transferred to ext [phone].

## 2016-01-22 ENCOUNTER — Ambulatory Visit: Payer: Medicare HMO | Admitting: Oncology

## 2016-01-24 ENCOUNTER — Telehealth: Payer: Self-pay | Admitting: Physician Assistant

## 2016-01-24 MED FILL — CITALOPRAM HBR 10 MG TABLET: 10 | 30 days supply | Qty: 30 | Fill #2

## 2016-01-24 MED FILL — FUROSEMIDE 40 MG TABLET: 40 | 90 days supply | Qty: 90 | Fill #0

## 2016-01-24 NOTE — Telephone Encounter (Signed)
Furosemide refill sent to pharmacy. Pt last seen by PCP 05/2015 and advised 6 month f/u. Pt is past due and needs to be seen for further refills.  Please call pt to schedule appt. Thanks!

## 2016-01-24 NOTE — Telephone Encounter (Signed)
Pt has been scheduled.  °

## 2016-02-20 ENCOUNTER — Ambulatory Visit (INDEPENDENT_AMBULATORY_CARE_PROVIDER_SITE_OTHER): Payer: Medicare HMO | Admitting: Physician Assistant

## 2016-02-20 VITALS — BP 116/86 | HR 66 | Temp 98.3°F | Resp 16 | Ht 67.0 in | Wt 182.0 lb

## 2016-02-20 DIAGNOSIS — Z23 Encounter for immunization: Secondary | ICD-10-CM | POA: Diagnosis not present

## 2016-02-20 DIAGNOSIS — I10 Essential (primary) hypertension: Secondary | ICD-10-CM | POA: Diagnosis not present

## 2016-02-20 DIAGNOSIS — F32A Depression, unspecified: Secondary | ICD-10-CM

## 2016-02-20 DIAGNOSIS — F329 Major depressive disorder, single episode, unspecified: Secondary | ICD-10-CM

## 2016-02-20 MED ORDER — CITALOPRAM HYDROBROMIDE 10 MG PO TABS: ORAL_TABLET | ORAL | 2 refills | Status: DC

## 2016-02-20 NOTE — Addendum Note (Signed)
Addended by: Magdalene Molly A on: 02/20/2016 10:31 AM   Modules accepted: Orders

## 2016-02-20 NOTE — Progress Notes (Signed)
Pre visit review using our clinic review tool, if applicable. No additional management support is needed unless otherwise documented below in the visit note/SLS  

## 2016-02-20 NOTE — Patient Instructions (Signed)
Please continue medications as directed. Make sure to speak with Dr. Jana Hakim regarding the Tamoxifen and other options.  Let me know how that visit goes.  Follow-up in 6 months for your physical.

## 2016-02-20 NOTE — Progress Notes (Signed)
Patient presents to clinic today for follow-up of anxiety/depression.  Is currently on Citalopram 10 mg daily. Is taking medication as directed. Endorses doing well on medication. Denies depressed mood with medication. Denies anxiety. Denies SI/HI. Is sleeping well with medication overall. Does still feel somewhat tired throughout the day that she has noticed since taking Tamoxifen.   Patient also with remote history of hypertension, previously controlled with diet and exercise. Is on Lasix 40 mg for chronic peripheral edema. Is taking daily as directed. Notes good relief of symptoms with medication. Patient denies chest pain, palpitations, lightheadedness. Notes hot flashes and change in depth perception with the Tamoxifen.   BP Readings from Last 3 Encounters:  02/20/16 116/86  07/25/15 (!) 155/75  06/28/15 (!) 167/92    Past Medical History:  Diagnosis Date  . Arthritis    "spine" (07/28/2014)  . Blood dyscrasia    bruises and bleed easily  . Breast cancer, left breast ([place]) 07/28/2014  . Breast cancer, right breast ([place]) 1995  . Chronic back pain    "all over"  . Complication of anesthesia    went to sleep easily but hard to wake up up until elbow OR in 2010  . Depression   . Dyspnea    Normal Spirometry 03/2008 EF 65% BNP normal 11/2007  . Family history of breast cancer   . Family history of colon cancer   . Family history of pancreatic cancer   . Gastric polyps   . GERD (gastroesophageal reflux disease)   . History of hiatal hernia   . Hx of adenomatous polyp of colon 07/03/2015  . Kidney stone    right kidney   . Lichen sclerosus   . Melanoma ([place]) 2010   "right elbow; treated at [facility]"  . Mild anxiety   . Vitamin B12 deficiency   . Vitamin D deficiency     Current Outpatient Prescriptions on File Prior to Visit  Medication Sig Dispense Refill  . Cholecalciferol (VITAMIN D3) 1000 UNITS CAPS Take 1 capsule by mouth daily.      . citalopram (CELEXA) 10 MG tablet TAKE 1  TABLET (10 MG TOTAL) BY MOUTH DAILY. 30 tablet 2  . Cyanocobalamin (VITAMIN B-12 CR) 1000 MCG TBCR Take 1 tablet by mouth daily.      . furosemide (LASIX) 40 MG tablet TAKE 1 TABLET (40 MG TOTAL) BY MOUTH DAILY. 90 tablet 0  . OVER THE COUNTER MEDICATION Probiotic 90 billion-Take 1 tablet by mouth daily.    . tamoxifen (NOLVADEX) 20 MG tablet Take 1 tablet (20 mg total) by mouth daily. 90 tablet 4  . [DISCONTINUED] sucralfate (CARAFATE) 1 GM/10ML suspension Take 1 g by mouth at bedtime as needed.       No current facility-administered medications on file prior to visit.     Allergies  Allergen Reactions  . Silodosin Rash    Facial rash  . Ace Inhibitors Other (See Comments)    REACTION: angioedema  . Buprenorphine Hcl Other (See Comments)    "crazy"  . Morphine And Related Other (See Comments)    "crazy"  . Codeine Rash  . Sulfonamide Derivatives Rash  . Telmisartan Rash    Family History  Problem Relation Age of Onset  . Stroke Other     F 1st degree relative 33, M 1st degree relative  . Colon cancer Cousin 59    double first cousin  . Colon cancer Cousin 73    double first cousin  . Colon  polyps Father     between 60-20  . Dementia Father   . Pancreatic cancer Brother 41  . Colon polyps Brother     between 10-20  . Cancer Paternal Uncle     NOS  . Anemia Paternal Grandfather     pernicious anemia  . Breast cancer Cousin 10    double first cousin  . Breast cancer Cousin 74    paternal cousin    Social History   Social History  . Marital status: Married    Spouse name: N/A  . Number of children: 2  . Years of education: N/A   Occupational History  . Manager    Social History Main Topics  . Smoking status: Never Smoker  . Smokeless tobacco: Never Used     Comment: Regular Exercise - Yes  . Alcohol use No  . Drug use: No  . Sexual activity: Yes   Other Topics Concern  . Not on file   Social History Narrative  . No narrative on file    Review of  Systems - See HPI.  All other ROS are negative.  BP 116/86 (BP Location: Right Arm, Patient Position: Sitting, Cuff Size: Large)   Pulse 66   Temp 98.3 F (36.8 C) (Oral)   Resp 16   Ht 5' 7"  (1.702 m)   Wt 182 lb (82.6 kg)   SpO2 99%   BMI 28.51 kg/m   Physical Exam  Constitutional: She is oriented to person, place, and time and well-developed, well-nourished, and in no distress.  HENT:  Head: Normocephalic and atraumatic.  Eyes: Conjunctivae are normal.  Cardiovascular: Normal rate, regular rhythm, normal heart sounds and intact distal pulses.   Pulses:      Dorsalis pedis pulses are 2+ on the right side, and 2+ on the left side.       Posterior tibial pulses are 2+ on the right side, and 2+ on the left side.  No pitting edema noted of lower extremities bilaterally.  Pulmonary/Chest: Effort normal and breath sounds normal. No respiratory distress. She has no wheezes. She has no rales. She exhibits no tenderness.  Neurological: She is alert and oriented to person, place, and time.  Skin: Skin is warm and dry. No rash noted.  Psychiatric: Affect normal.  Vitals reviewed.   Recent Results (from the past 2160 hour(s))  CBC with Differential     Status: None   Collection Time: 01/15/16  8:05 AM  Result Value Ref Range   WBC 6.1 3.9 - 10.3 10e3/uL   NEUT# 3.5 1.5 - 6.5 10e3/uL   HGB 13.7 11.6 - 15.9 g/dL   HCT 40.0 34.8 - 46.6 %   Platelets 155 145 - 400 10e3/uL   MCV 93.0 79.5 - 101.0 fL   MCH 31.9 25.1 - 34.0 pg   MCHC 34.3 31.5 - 36.0 g/dL   RBC 4.30 3.70 - 5.45 10e6/uL   RDW 13.3 11.2 - 14.5 %   lymph# 1.8 0.9 - 3.3 10e3/uL   MONO# 0.4 0.1 - 0.9 10e3/uL   Eosinophils Absolute 0.3 0.0 - 0.5 10e3/uL   Basophils Absolute 0.0 0.0 - 0.1 10e3/uL   NEUT% 58.2 38.4 - 76.8 %   LYMPH% 28.9 14.0 - 49.7 %   MONO% 7.1 0.0 - 14.0 %   EOS% 5.1 0.0 - 7.0 %   BASO% 0.7 0.0 - 2.0 %  Comprehensive metabolic panel     Status: Abnormal   Collection Time: 01/15/16  8:05 AM  Result  Value Ref Range   Sodium 145 136 - 145 mEq/L   Potassium 3.8 3.5 - 5.1 mEq/L   Chloride 109 98 - 109 mEq/L   CO2 28 22 - 29 mEq/L   Glucose 98 70 - 140 mg/dl    Comment: Glucose reference range is for nonfasting patients. Fasting glucose reference range is 70- 100.   BUN 17.5 7.0 - 26.0 mg/dL   Creatinine 0.9 0.6 - 1.1 mg/dL   Total Bilirubin 0.50 0.20 - 1.20 mg/dL   Alkaline Phosphatase 48 40 - 150 U/L   AST 25 5 - 34 U/L   ALT 21 0 - 55 U/L   Total Protein 6.5 6.4 - 8.3 g/dL   Albumin 3.6 3.5 - 5.0 g/dL   Calcium 8.9 8.4 - 10.4 mg/dL   Anion Gap 8 3 - 11 mEq/L   EGFR 63 (L) >90 ml/min/1.73 m2    Comment: eGFR is calculated using the CKD-EPI Creatinine Equation (2009)    Assessment/Plan: 1. Essential hypertension BP Normotensive. Well-controlled. Recent BMP with Oncology within normal limits. Continue diet, exercise and Lasix as directed.  2. Depression Doing well on Citalopram. Will continue for now. She is to discuss Tamoxifen side effects with Dr. Jana Hakim on Thursday. If taken off of medication, she would like to switch back to Prozac. Will discuss at that time. Medications refilled.  Prevnar and flu vaccinations given today.   Leeanne Rio, PA-C

## 2016-02-22 ENCOUNTER — Ambulatory Visit (HOSPITAL_BASED_OUTPATIENT_CLINIC_OR_DEPARTMENT_OTHER): Payer: Medicare HMO | Admitting: Oncology

## 2016-02-22 VITALS — BP 150/69 | HR 70 | Temp 98.1°F | Resp 18 | Ht 67.0 in | Wt 181.1 lb

## 2016-02-22 DIAGNOSIS — N951 Menopausal and female climacteric states: Secondary | ICD-10-CM

## 2016-02-22 DIAGNOSIS — R2689 Other abnormalities of gait and mobility: Secondary | ICD-10-CM

## 2016-02-22 DIAGNOSIS — C50412 Malignant neoplasm of upper-outer quadrant of left female breast: Secondary | ICD-10-CM | POA: Diagnosis not present

## 2016-02-22 DIAGNOSIS — C50911 Malignant neoplasm of unspecified site of right female breast: Secondary | ICD-10-CM

## 2016-02-22 DIAGNOSIS — R52 Pain, unspecified: Secondary | ICD-10-CM

## 2016-02-22 DIAGNOSIS — C4360 Malignant melanoma of unspecified upper limb, including shoulder: Secondary | ICD-10-CM

## 2016-02-22 DIAGNOSIS — Z853 Personal history of malignant neoplasm of breast: Secondary | ICD-10-CM

## 2016-02-22 MED ORDER — ANASTROZOLE 1 MG PO TABS: 1.0000 mg | ORAL_TABLET | Freq: Every day | ORAL | 4 refills | Status: DC

## 2016-02-22 MED FILL — ANASTROZOLE 1 MG TABLET: 1 | 90 days supply | Qty: 90 | Fill #0 | Status: TO

## 2016-02-22 NOTE — Progress Notes (Signed)
[facility]  [phone] [phone]     ID: [name] DOB: 04/09/1946  MR#: [id number]  LSL#:[id number]  Patient Care Team: [name] Jeans, PA-C as PCP - General (Physician Assistant) Josue Hector, MD (Cardiology) Cheri Fowler, MD as Attending Physician (Obstetrics and Gynecology) Chauncey Cruel, MD (Hematology and Oncology) Gatha Mayer, MD as Consulting Physician (Gastroenterology) Iona Beard, MD as Referring Physician (Optometry) Autumn Messing III, MD as Consulting Physician (General Surgery) Ardis Hughs, MD as Attending Physician (Urology) Charolotte Capuchin, MD as Consulting Physician (General Practice) PCP: Leeanne Rio, PA-C GYN: SU: Star Age MD, Stevphen Rochester MD OTHER MD: Crissie Reese M.D. , Nena Polio M.D., Silvano Rusk M.D., Stevphen Rochester MD, Sherren Mocha McDiarmid MD  CHIEF COMPLAINT: Bilateral breast cancers  CURRENT TREATMENT: Tamoxifen    Breast cancer of upper-outer quadrant of left female breast ([place])   05/09/2014 Initial Diagnosis    Left breast  upper-outer quadrant biopsy shows an invasive breast cancer with lobular features, grade 1, estrogen receptor 100% positive, progesterone receptor 100% positive, with an MIB-1 of 8% and no HER-2 amplification       Breast cancer ([place])   09/08/2014 Initial Diagnosis    Breast cancer       HISTORY OF BREAST CANCER From the original intake note:  [name], now [name], was my patient in 1996, at which time she had an early stage breast cancer treated with mastectomy with TRAM flap reconstruction followed by CMF chemotherapy 8. She did not receive radiation or anti-estrogens. She was released from follow-up early this sensory.  More recently [name] had routine left screening mammography with tomography at the breast Center 04/27/2014. Breast density was category B. A possible mass in the left breast was noted and on 05/09/2014 she underwent diagnostic  left mammography and ultrasonography showing a 9 mm spiculated asymmetry in the left breast upper outer quadrant. This was not palpable. Ultrasound identified a 1.0 cm irregular hypoechoic lesion in that area. Ultrasound of the left axilla was negative.  Biopsy of the left breast mass 05/09/2014 showed ([id number]) an invasive breast cancer with lobular features, grade 1. The tumor was estrogen receptor 100% positive, progesterone receptor 99% positive, both with strong staining intensity. The MIB-1 was 8%. HER-2 was not amplified, the signals ratio being 1.10 and the number per cell 1.70.  On 05/17/2014 the patient underwent bilateral breast MRI. This showed breast composition category C. In the upper outer quadrant of the left breast there was stippled non-masslike enhancement measuring 3.9 cm. Within this region there was an area of slightly increased consolidation corresponding to the smaller mass noted on ultrasonography. There were no abnormal appearing lymph nodes in the right breast was unremarkable.  [name]'s case was presented at the breast cancer multidisciplinary conference 05/18/2014. The patient is a candidate for breast conservation, and if she opts for that her surgeon may wish to do a generous lumpectomy or proceed to biopsy of the more extensive area of stippled non-masslike enhancement. It was felt that since the patient had a mastectomy on the right she might choose a mastectomy on the left. An Oncotype test was also recommended.  [name] was evaluated at the breast clinic 05/18/2014. Her subsequent history is as detailed below  [name] returns today for follow-up of her bilateral breast cancers. She continues on tamoxifen, with significant hot flashes, which causes her to sweat and some embarrassment. She obtains a drug at a good price.  REVIEW OF SYSTEMS: [name] has been "hurting all over," including her knees, back, hands, and pain going down the right leg. She  did have a CT scan of the abdomen and pelvis in August looking for kidney stones and it found significant degenerative disease in the spine. She is aware that she has arthritis. In addition she says her depth perception is very. She is afraid of running into the car front of her. She is stumbling against walls of bruising herself. She says she had an eye exam on the eyes [name] fine. She has occasional headaches and a little bit of nausea, but no persistent symptoms. She describes herself is moderately fatigued. She has sinus problems. She is short of breath when walking upstairs. She keeps a little bit of a dry cough. She feels forgetful but not anxious or depressed. A detailed review of systems today was otherwise stable  Allergies  Allergen Reactions  . Silodosin Rash    Facial rash  . Ace Inhibitors Other (See Comments)    REACTION: angioedema  . Buprenorphine Hcl Other (See Comments)    "crazy"  . Morphine And Related Other (See Comments)    "crazy"  . Codeine Rash  . Sulfonamide Derivatives Rash  . Telmisartan Rash    Current Outpatient Prescriptions  Medication Sig Dispense Refill  . Cholecalciferol (VITAMIN D3) 1000 UNITS CAPS Take 1 capsule by mouth daily.      . citalopram (CELEXA) 10 MG tablet TAKE 1 TABLET (10 MG TOTAL) BY MOUTH DAILY. 30 tablet 2  . Cyanocobalamin (VITAMIN B-12 CR) 1000 MCG TBCR Take 1 tablet by mouth daily.      . furosemide (LASIX) 40 MG tablet TAKE 1 TABLET (40 MG TOTAL) BY MOUTH DAILY. 90 tablet 0  . naproxen sodium (ANAPROX) 220 MG tablet Take 220 mg by mouth every morning.    Marland Kitchen OVER THE COUNTER MEDICATION Probiotic 90 billion-Take 1 tablet by mouth daily.    . tamoxifen (NOLVADEX) 20 MG tablet Take 1 tablet (20 mg total) by mouth daily. 90 tablet 4   No current facility-administered medications for this visit.     PAST MEDICAL HISTORY: Past Medical History:  Diagnosis Date  . Arthritis    "spine" (07/28/2014)  . Blood dyscrasia    bruises and bleed  easily  . Breast cancer, left breast ([place]) 07/28/2014  . Breast cancer, right breast ([place]) 1995  . Chronic back pain    "all over"  . Complication of anesthesia    went to sleep easily but hard to wake up up until elbow OR in 2010  . Depression   . Dyspnea    Normal Spirometry 03/2008 EF 65% BNP normal 11/2007  . Family history of breast cancer   . Family history of colon cancer   . Family history of pancreatic cancer   . Gastric polyps   . GERD (gastroesophageal reflux disease)   . History of hiatal hernia   . Hx of adenomatous polyp of colon 07/03/2015  . Kidney stone    right kidney   . Lichen sclerosus   . Melanoma ([place]) 2010   "right elbow; [facility] at [facility]"  . Mild anxiety   . Vitamin B12 deficiency   . Vitamin D deficiency     PAST SURGICAL HISTORY: Past Surgical History:  Procedure Laterality Date  . BREAST BIOPSY Left 04/2014  . BREAST RECONSTRUCTION WITH PLACEMENT OF TISSUE EXPANDER AND FLEX HD (ACELLULAR HYDRATED DERMIS) Left 07/28/2014   Procedure: LEFT BREAST RECONSTRUCTION  PLACEMENT OF LEFT TISSUE EXPANDER ;  Surgeon: Crissie Reese, MD;  Location: [facility];  Service: Plastics;  Laterality: Left;  . BREAST RECONSTRUCTION WITH PLACEMENT OF TISSUE EXPANDER AND FLEX HD (ACELLULAR HYDRATED DERMIS) Left 09/08/2014   Procedure: REMOVAL OF TISSUE EXPANDER FROM LEFT BREAST;  Surgeon: Crissie Reese, MD;  Location: [facility];  Service: Plastics;  Laterality: Left;  . BUNIONECTOMY Bilateral 1970's  . COLONOSCOPY     Dr Sharlett Iles  . CYSTOSCOPY WITH RETROGRADE PYELOGRAM, URETEROSCOPY AND STENT PLACEMENT Right 06/09/2015   Procedure: CYSTOSCOPY WITH RIGHT RETROGRADE PYELOGRAM, RIGHT URETEROSCOPY AND RIGHT URTERAL STENT PLACEMENT;  Surgeon: Ardis Hughs, MD;  Location: [facility];  Service: Urology;  Laterality: Right;  . ESOPHAGOGASTRODUODENOSCOPY    . HERNIA REPAIR    . HOLMIUM LASER APPLICATION Right 1/61/0960   Procedure: HOLMIUM LASER APPLICATION;  Surgeon: Ardis Hughs, MD;   Location: [facility];  Service: Urology;  Laterality: Right;  . LATISSIMUS FLAP TO BREAST Left 09/08/2014   Procedure: LEFT LATISSIMUS FLAP TO BREAST WITH SALINE IMPLANT FOR BREAST RECONSTRUCTION;  Surgeon: Crissie Reese, MD;  Location: [facility];  Service: Plastics;  Laterality: Left;  Marland Kitchen MASTECTOMY Right 1996    chemotherapy. pt. states 13 lymph nodes were removed  . MASTECTOMY COMPLETE / SIMPLE W/ SENTINEL NODE BIOPSY Left 07/28/2014  . MASTECTOMY W/ SENTINEL NODE BIOPSY Left 07/28/2014   Procedure: LEFT MASTECTOMY WITH SENTINEL LYMPH NODE MAPPING;  Surgeon: Autumn Messing III, MD;  Location: [facility];  Service: General;  Laterality: Left;  Marland Kitchen MELANOMA EXCISION Right 2010   From elbow-- Done at [facility]   . NISSEN FUNDOPLICATION  [id number]  . RECONSTRUCTION BREAST IMMEDIATE / DELAYED W/ TISSUE EXPANDER Left 07/28/2014  . TEMPOROMANDIBULAR JOINT SURGERY Bilateral 1987  . TONSILLECTOMY      FAMILY HISTORY Family History  Problem Relation Age of Onset  . Stroke Other     F 1st degree relative 30, M 1st degree relative  . Colon cancer Cousin 58    double first cousin  . Colon cancer Cousin 71    double first cousin  . Colon polyps Father     between 43-20  . Dementia Father   . Pancreatic cancer Brother 92  . Colon polyps Brother     between 10-20  . Cancer Paternal Uncle     NOS  . Anemia Paternal Grandfather     pernicious anemia  . Breast cancer Cousin 72    double first cousin  . Breast cancer Cousin 83    paternal cousin   the patient's father died at age 48 in the setting of Alzheimer's disease. The patient's mother died at age 53 following a stroke. Cherice has 3 brothers, no sisters. [name]'s niece, [name], was my patient with a history of breast cancer diagnosed at age 67. She succumbed to that tumor. The patient also has 2 paternal cousins with breast cancer diagnosed at age 70 and age 60. One brother has prostate cancer diagnosed at age 32. The other brother has a history of throat cancer  diagnosed at age 69. There is no other history of breast or ovarian cancer in the family to her knowledge.  GENETICS TESTING:  MUTYH mutation, normal BRCA genes  GYNECOLOGIC HISTORY:  No LMP recorded. Patient is not currently having periods (Reason: Chemotherapy). Menarche age 59, first live birth age 75. She is GX P2. She went through menopause in 1996 at the time of her chemotherapy. She did not take hormone replacement. 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 Her husband of 3 years, [name], is a Gaffer at [employer]. He matches colors for a [employer]. Pietra has 2 children from a prior marriage, [name], 70 years old, lives in [place] and is a homemaker; and [name], 61, who lives in [place] and works as a Freight forwarder. [name] has 4 biological grandchildren. [name] has a son, [name], who lives in [place] and worse at [employer]. [name] has 4 grandchildren of his. The patient is a Psychologist, forensic.    ADVANCED DIRECTIVES: In place; currently Nyimah's children [name] and [name] are co- healthcare powers of attorney. [name] can be reached at [place]: Social History  Substance Use Topics  . Smoking status: Never Smoker  . Smokeless tobacco: Never Used     Comment: Regular Exercise - Yes  . Alcohol use No     Colonoscopy: FEB 2017  PAP: 2013  Bone density: Remote  Lipid panel:    OBJECTIVE: Middle-aged white woman who appears Stated age Vitals:   02/22/16 0857  BP: (!) 150/69  Pulse: 70  Resp: 18  Temp: 98.1 F (36.7 C)     Body mass index is 28.36 kg/m.    ECOG FS:1 - Symptomatic but completely ambulatory  Sclerae unicteric, pupils round and equal, reactive, EOMs intact Oropharynx clear and moist-- no thrush or other lesions No cervical or supraclavicular adenopathy Lungs no rales or  rhonchi Heart regular rate and rhythm Abd soft, nontender, positive bowel sounds MSK no focal spinal tenderness, no upper extremity lymphedema Neuro: nonfocal, well oriented, appropriate affect, Romberg staggers to right Breasts: Status post bilateral mastectomies with bilateral reconstruction. No evidence of local recurrence.     LAB RESULTS:  CMP     Component Value Date/Time   NA 145 01/15/2016 0805   K 3.8 01/15/2016 0805   CL 105 06/13/2015 0830   CO2 28 01/15/2016 0805   GLUCOSE 98 01/15/2016 0805   BUN 17.5 01/15/2016 0805   CREATININE 0.9 01/15/2016 0805   CALCIUM 8.9 01/15/2016 0805   PROT 6.5 01/15/2016 0805   ALBUMIN 3.6 01/15/2016 0805   AST 25 01/15/2016 0805   ALT 21 01/15/2016 0805   ALKPHOS 48 01/15/2016 0805   BILITOT 0.50 01/15/2016 0805   GFRNONAA 54 (L) 06/07/2015 0905   GFRAA >60 06/07/2015 0905    INo results found for: SPEP, UPEP  Lab Results  Component Value Date   WBC 6.1 01/15/2016   NEUTROABS 3.5 01/15/2016   HGB 13.7 01/15/2016   HCT 40.0 01/15/2016   MCV 93.0 01/15/2016   PLT 155 01/15/2016      Chemistry      Component Value Date/Time   NA 145 01/15/2016 0805   K 3.8 01/15/2016 0805   CL 105 06/13/2015 0830   CO2 28 01/15/2016 0805   BUN 17.5 01/15/2016 0805   CREATININE 0.9 01/15/2016 0805      Component Value Date/Time   CALCIUM 8.9 01/15/2016 0805   ALKPHOS 48 01/15/2016 0805   AST 25 01/15/2016 0805   ALT 21 01/15/2016 0805   BILITOT 0.50 01/15/2016 0805       No results found for: LABCA2  No components found for: LABCA125  No results for input(s): INR in the last 168 hours.  Urinalysis    Component Value Date/Time   COLORURINE YELLOW 06/13/2015 0830  APPEARANCEUR CLEAR 06/13/2015 0830   LABSPEC 1.025 06/13/2015 0830   PHURINE 6.0 06/13/2015 0830   GLUCOSEU NEGATIVE 06/13/2015 0830   HGBUR LARGE (A) 06/13/2015 0830   BILIRUBINUR NEGATIVE 06/13/2015 0830   BILIRUBINUR neg 12/23/2014 1734   KETONESUR  NEGATIVE 06/13/2015 0830   PROTEINUR 2+ 12/23/2014 1734   PROTEINUR NEGATIVE 08/28/2014 1438   UROBILINOGEN 1.0 06/13/2015 0830   NITRITE NEGATIVE 06/13/2015 0830   LEUKOCYTESUR NEGATIVE 06/13/2015 0830    RADIOLOGY AND OTHER STUDIES: Study Result   CLINICAL DATA:  Right lower quadrant pain for 1 month, hematuria  EXAM: CT ABDOMEN AND PELVIS WITHOUT CONTRAST  TECHNIQUE: Multidetector CT imaging of the abdomen and pelvis was performed following the standard protocol without IV contrast.  COMPARISON:  CT scan 11/12/2013  FINDINGS: Tiny nodules left lung bases are stable. Mild dextroscoliosis lumbar spine. There is significant disc space flattening with endplate sclerotic changes at L4-L5 level. Degenerative changes lower thoracic spine.  Significant fatty infiltration of the liver. Post Nissen fundoplication postsurgical changes and GE junction level. No intrahepatic biliary ductal dilatation. No calcified gallstones are noted within gallbladder. Unenhanced pancreas, spleen and adrenal glands are unremarkable. At least 2 nonobstructive calcified calculi are noted in lower pole of the right kidney the largest measures 5.3 mm. No hydronephrosis or hydroureter. No calcified ureteral calculi are noted bilaterally. Multiple pelvic phleboliths. Atherosclerotic calcifications of abdominal aorta and iliac arteries. No aortic aneurysm. The uterus is atrophic. No adnexal mass. Limited assessment of urinary bladder which is empty. Degenerative changes pubic symphysis. Degenerative changes bilateral SI joints.  No small bowel obstruction. There is no pericecal inflammation. The terminal ileum is unremarkable. Normal appendix is partially visualized in axial image 62. There is a surgical clip in left anterior pelvis.  IMPRESSION: 1. There is right nonobstructive nephrolithiasis. No hydronephrosis or hydroureter. 2. No calcified ureteral calculi. 3. No small bowel  obstruction. 4. No pericecal inflammation.  Normal appendix. 5. Limited assessment of urinary bladder which is empty. 6. Degenerative changes thoracolumbar spine. Degenerative changes bilateral SI joints. Degenerative changes pubic symphysis. 7. Significant fatty infiltration of the liver.   Electronically Signed   By: Lahoma Crocker M.D.   On: 12/26/2014 09:50     ASSESSMENT: 70 y.o. [place] woman status post left breast upper outer quadrant biopsy 05/09/2014 for a clinical T1-T2, N0 (stage I-II) invasive lobular breast cancer, estrogen receptor 100% positive, progesterone receptor 99% positive, with an MIB-1 of 8%, and no HER-2 amplification.  (1) history of right modified radical mastectomy with TRAM reconstruction 1996, followed by chemotherapy for 6 months; did not receive antiestrogen therapy or radiation therapy adjuvantly  (2) tamoxifen started neoadjuvantly 05/18/2014  (3) status post left mastectomy and sentinel lymph node sampling 07/28/2014 for a pT1c pN0(i+). Stage IA invasive ductal carcinoma, grade 1, with repeat HER-2 again negative. Margins were ample  (4) Oncotype score of 3 predicts an outside the breast risk of recurrence within 10 years of 4% if the patient's only systemic treatment is tamoxifen for 5 years. It also predicts no significant benefit from chemotherapy.  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 Sequencing and rearrangement analysis for the following 32 genes found no deleterious mutations: APC, ATM, BARD1, BMPR1A, BRCA1, BRCA2, BRIP1, CDH1, CDK4, CDKN2A, CHEK2, EPCAM, GREM1, MLH1, MRE11A, MSH2, MSH6,  MUTYH, NBN, NF1, PALB2, PMS2, POLD1, POLE, PTEN, RAD50, RAD51D, SMAD4, SMARCA4, STK11, and TP53.  (a) Heterozygous MUTYH  mutations are not associated with an increased risk for cancer in the patient, however, other family members may be at risk for homozygous mutations and therefore family testing should be considered.   PLAN: [name] is now a year and a half out from definitive surgery for her left-sided breast cancer with no evidence of disease recurrence. This is favorable.  She is tolerating tamoxifen only moderately well. She would not mind giving aromatase inhibitors a try in case they cause fewer hot flashes. She understands that they could make her bony aches worse. At any case we are going to go off tamoxifen now and she will start anastrozole 04/26/2016. She has a good understanding of the possible toxicities side effects and complications of this agent, which however we will further reduce the risk of recurrence above and beyond the benefit she would have obtained from 5 years of tamoxifen  She is having some balance issues which are demonstrable by exam and by history. She has had a good eye exam which shows nothing wrong with her vision. This is going to be a cerebellar problem. Note that there is no history of alcohol use. We are going to start with a brain MRI. She understands I expect that to be negative. Once we demonstrate that she will be referred to neurology for further evaluation  Tonita has a good understanding of this plan. She agrees with it. She will call with any problems that may develop before her next visit.   Chauncey Cruel, MD   02/22/2016 9:05 AM Medical Oncology and Hematology [facility] [address] [place],  [address] Tel. [phone]    Fax. [phone]

## 2016-02-27 ENCOUNTER — Ambulatory Visit (HOSPITAL_COMMUNITY)
Admission: RE | Admit: 2016-02-27 | Discharge: 2016-02-27 | Disposition: A | Discharge status: Home / Self Care | Payer: Medicare HMO | Source: Ambulatory Visit | Attending: Oncology | Admitting: Oncology

## 2016-02-27 DIAGNOSIS — C50412 Malignant neoplasm of upper-outer quadrant of left female breast: Secondary | ICD-10-CM

## 2016-02-27 DIAGNOSIS — C4360 Malignant melanoma of unspecified upper limb, including shoulder: Secondary | ICD-10-CM | POA: Diagnosis not present

## 2016-02-27 DIAGNOSIS — J328 Other chronic sinusitis: Secondary | ICD-10-CM | POA: Diagnosis not present

## 2016-02-27 DIAGNOSIS — C50911 Malignant neoplasm of unspecified site of right female breast: Secondary | ICD-10-CM | POA: Insufficient documentation

## 2016-02-27 DIAGNOSIS — I6782 Cerebral ischemia: Secondary | ICD-10-CM | POA: Diagnosis not present

## 2016-02-27 MED ORDER — GADOBENATE DIMEGLUMINE 529 MG/ML IV SOLN
20.0000 mL | Freq: Once | INTRAVENOUS | Status: AC | PRN
  Administered 2016-02-27: 17 mL via INTRAVENOUS

## 2016-02-29 ENCOUNTER — Encounter: Payer: Self-pay | Admitting: Family Medicine

## 2016-02-29 ENCOUNTER — Ambulatory Visit (INDEPENDENT_AMBULATORY_CARE_PROVIDER_SITE_OTHER): Payer: Medicare HMO | Admitting: Family Medicine

## 2016-02-29 VITALS — BP 150/80 | HR 80 | Temp 98.4°F | Ht 67.0 in | Wt 183.8 lb

## 2016-02-29 DIAGNOSIS — R8290 Unspecified abnormal findings in urine: Secondary | ICD-10-CM

## 2016-02-29 DIAGNOSIS — Z711 Person with feared health complaint in whom no diagnosis is made: Secondary | ICD-10-CM | POA: Diagnosis not present

## 2016-02-29 DIAGNOSIS — R82998 Other abnormal findings in urine: Secondary | ICD-10-CM

## 2016-02-29 DIAGNOSIS — R8299 Other abnormal findings in urine: Secondary | ICD-10-CM | POA: Diagnosis not present

## 2016-02-29 LAB — POC URINALSYSI DIPSTICK (AUTOMATED)
BILIRUBIN UA: NEGATIVE
Blood, UA: NEGATIVE
GLUCOSE UA: NEGATIVE
KETONES UA: NEGATIVE
LEUKOCYTES UA: NEGATIVE
NITRITE UA: NEGATIVE
Protein, UA: NEGATIVE
Spec Grav, UA: 1.025
Urobilinogen, UA: 0.2
pH, UA: 6

## 2016-02-29 NOTE — Addendum Note (Signed)
Addended by: Harl Bowie on: 02/29/2016 04:23 PM   Modules accepted: Orders

## 2016-02-29 NOTE — Progress Notes (Signed)
Chief Complaint  Patient presents with  . Urinary Tract Infection    pt states her urine has an odor and it's dark-x 1 week    [name] is a 70 y.o. female here for possible UTI.  Duration: 3 days. Symptoms: odor and dark colored urine Denies: urinary frequency, hematuria, urinary hesitancy, urinary retention, fever, abdominal pain, vaginal discharge Hx of recurrent UTI? No  ROS:  Constitutional: denies fever GU: As noted in HPI MSK: Denies back pain Abd: Denies constipation or abdominal pain  Past Medical History:  Diagnosis Date  . Arthritis    "spine" (07/28/2014)  . Blood dyscrasia    bruises and bleed easily  . Breast cancer, left breast ([place]) 07/28/2014  . Breast cancer, right breast ([place]) 1995  . Chronic back pain    "all over"  . Complication of anesthesia    went to sleep easily but hard to wake up up until elbow OR in 2010  . Depression   . Dyspnea    Normal Spirometry 03/2008 EF 65% BNP normal 11/2007  . Family history of breast cancer   . Family history of colon cancer   . Family history of pancreatic cancer   . Gastric polyps   . GERD (gastroesophageal reflux disease)   . History of hiatal hernia   . Hx of adenomatous polyp of colon 07/03/2015  . Kidney stone    right kidney   . Lichen sclerosus   . Melanoma ([place]) 2010   "right elbow; [facility] at [facility]"  . Mild anxiety   . Vitamin B12 deficiency   . Vitamin D deficiency    Family History  Problem Relation Age of Onset  . Stroke Other     F 1st degree relative 56, M 1st degree relative  . Colon cancer Cousin 90    double first cousin  . Colon cancer Cousin 87    double first cousin  . Colon polyps Father     between 79-20  . Dementia Father   . Pancreatic cancer Brother 35  . Colon polyps Brother     between 10-20  . Cancer Paternal Uncle     NOS  . Anemia Paternal Grandfather     pernicious anemia  . Breast cancer Cousin 49    double first cousin  . Breast cancer Cousin 74    paternal  cousin   Social History   Social History  . Marital status: Married  . Number of children: 2   Occupational History  . Manager    Social History Main Topics  . Smoking status: Never Smoker  . Smokeless tobacco: Never Used     Comment: Regular Exercise - Yes  . Alcohol use No  . Drug use: No  . Sexual activity: Yes    BP (!) 150/80 (BP Location: Left Arm, Patient Position: Sitting, Cuff Size: Normal)   Pulse 80   Temp 98.4 F (36.9 C) (Oral)   Ht 5\' 7"  (1.702 m)   Wt 183 lb 12.8 oz (83.4 kg)   SpO2 98%   BMI 28.79 kg/m  General: Awake, alert, appears stated age HEENT: MMM Heart: RRR, no murmurs Lungs: CTAB, normal respiratory effort, no accessory muscle usage Abd: BS+, soft, NT, ND, no masses or organomegaly MSK: No CVA tenderness, neg Lloyd's sign Psych: Age appropriate judgment and insight  Physically well but worried  Orders as above. Urine negative, her symptoms have resolved. Could be due to dehydration. She does say she does not  drink lots of water. Follow-up as needed. The patient voiced understanding and agreement to the plan.  [place], DO 02/29/16 12:18 PM

## 2016-02-29 NOTE — Progress Notes (Signed)
Pre visit review using our clinic review tool, if applicable. No additional management support is needed unless otherwise documented below in the visit note. 

## 2016-03-04 MED FILL — CITALOPRAM HBR 10 MG TABLET: 10 | 30 days supply | Qty: 30 | Fill #0

## 2016-03-24 NOTE — Progress Notes (Signed)
Patient presents to clinic today c/o Left leg pain x 1 month s/p injury. Patient endorses being in the garage and moving things when a coffee table fell on her left calf. Pain is described as soreness. Is not radiating. 7/10 on average. 8/10 at worst. Patient endorses pain seems to be worsening since onset. Denies swelling, numbness or tingling. Notes some hardness of skin at site of impact.   Patient also endorses R hip pain. Is currently followed by a chiropractor who has diagnosed her with bursitis and sacroiliitis. She was instructed by chiropractor to see her PCP for imaging of hip and lumbosacral spine. Physical therapy was also recommended and patient would like to set this up. Hip pain is described as burning and sharp. Is non-radiating. Is sometimes accompanied by lower back pain described as aching and soreness. Has history of OA. Denies trauma or injury to lower back or hip. Denies numbness, weakness of extremity. Notes some tingling in RLE if she sits for too long.  Past Medical History:  Diagnosis Date  . Arthritis    "spine" (07/28/2014)  . Blood dyscrasia    bruises and bleed easily  . Breast cancer, left breast ([place]) 07/28/2014  . Breast cancer, right breast ([place]) 1995  . Chronic back pain    "all over"  . Complication of anesthesia    went to sleep easily but hard to wake up up until elbow OR in 2010  . Depression   . Dyspnea    Normal Spirometry 03/2008 EF 65% BNP normal 11/2007  . Family history of breast cancer   . Family history of colon cancer   . Family history of pancreatic cancer   . Gastric polyps   . GERD (gastroesophageal reflux disease)   . History of hiatal hernia   . Hx of adenomatous polyp of colon 07/03/2015  . Kidney stone    right kidney   . Lichen sclerosus   . Melanoma ([place]) 2010   "right elbow; treated at [facility]"  . Mild anxiety   . Vitamin B12 deficiency   . Vitamin D deficiency     Current Outpatient Prescriptions on File Prior to Visit    Medication Sig Dispense Refill  . anastrozole (ARIMIDEX) 1 MG tablet Take 1 tablet (1 mg total) by mouth daily. 90 tablet 4  . Cholecalciferol (VITAMIN D3) 1000 UNITS CAPS Take 1 capsule by mouth daily.      . citalopram (CELEXA) 10 MG tablet TAKE 1 TABLET (10 MG TOTAL) BY MOUTH DAILY. 30 tablet 2  . Cyanocobalamin (VITAMIN B-12 CR) 1000 MCG TBCR Take 1 tablet by mouth daily.      . furosemide (LASIX) 40 MG tablet TAKE 1 TABLET (40 MG TOTAL) BY MOUTH DAILY. 90 tablet 0  . naproxen sodium (ANAPROX) 220 MG tablet Take 220 mg by mouth every morning.    Marland Kitchen OVER THE COUNTER MEDICATION Probiotic 90 billion-Take 1 tablet by mouth daily.    . [DISCONTINUED] sucralfate (CARAFATE) 1 GM/10ML suspension Take 1 g by mouth at bedtime as needed.       No current facility-administered medications on file prior to visit.     Allergies  Allergen Reactions  . Silodosin Rash    Facial rash  . Ace Inhibitors Other (See Comments)    REACTION: angioedema  . Buprenorphine Hcl Other (See Comments)    "crazy"  . Morphine And Related Other (See Comments)    "crazy"  . Codeine Rash  . Sulfonamide Derivatives Rash  .  Telmisartan Rash    Family History  Problem Relation Age of Onset  . Stroke Other     F 1st degree relative 42, M 1st degree relative  . Colon cancer Cousin 67    double first cousin  . Colon cancer Cousin 60    double first cousin  . Colon polyps Father     between 64-20  . Dementia Father   . Pancreatic cancer Brother 46  . Colon polyps Brother     between 10-20  . Cancer Paternal Uncle     NOS  . Anemia Paternal Grandfather     pernicious anemia  . Breast cancer Cousin 40    double first cousin  . Breast cancer Cousin 74    paternal cousin    Social History   Social History  . Marital status: Married    Spouse name: N/A  . Number of children: 2  . Years of education: N/A   Occupational History  . Manager    Social History Main Topics  . Smoking status: Never Smoker   . Smokeless tobacco: Never Used     Comment: Regular Exercise - Yes  . Alcohol use No  . Drug use: No  . Sexual activity: Yes   Other Topics Concern  . Not on file   Social History Narrative  . No narrative on file    Review of Systems - See HPI.  All other ROS are negative.  There were no vitals taken for this visit.  Physical Exam  Constitutional: She is oriented to person, place, and time and well-developed, well-nourished, and in no distress.  HENT:  Head: Normocephalic and atraumatic.  Cardiovascular: Normal rate, regular rhythm, normal heart sounds and intact distal pulses.   Pulmonary/Chest: Effort normal.  Musculoskeletal:       Right hip: She exhibits tenderness. She exhibits normal range of motion, normal strength, no bony tenderness and no swelling.       Right knee: Normal.       Left knee: Normal.       Lumbar back: Normal.       Right lower leg: Normal.       Left lower leg: She exhibits tenderness. She exhibits no bony tenderness, no swelling, no edema, no deformity and no laceration.  Neurological: She is alert and oriented to person, place, and time. She has normal strength.  Skin: Skin is warm and dry. No rash noted.  Psychiatric: Affect normal.  Vitals reviewed.   Recent Results (from the past 2160 hour(s))  CBC with Differential     Status: None   Collection Time: 01/15/16  8:05 AM  Result Value Ref Range   WBC 6.1 3.9 - 10.3 10e3/uL   NEUT# 3.5 1.5 - 6.5 10e3/uL   HGB 13.7 11.6 - 15.9 g/dL   HCT 40.0 34.8 - 46.6 %   Platelets 155 145 - 400 10e3/uL   MCV 93.0 79.5 - 101.0 fL   MCH 31.9 25.1 - 34.0 pg   MCHC 34.3 31.5 - 36.0 g/dL   RBC 4.30 3.70 - 5.45 10e6/uL   RDW 13.3 11.2 - 14.5 %   lymph# 1.8 0.9 - 3.3 10e3/uL   MONO# 0.4 0.1 - 0.9 10e3/uL   Eosinophils Absolute 0.3 0.0 - 0.5 10e3/uL   Basophils Absolute 0.0 0.0 - 0.1 10e3/uL   NEUT% 58.2 38.4 - 76.8 %   LYMPH% 28.9 14.0 - 49.7 %   MONO% 7.1 0.0 - 14.0 %   EOS%  5.1 0.0 - 7.0 %    BASO% 0.7 0.0 - 2.0 %  Comprehensive metabolic panel     Status: Abnormal   Collection Time: 01/15/16  8:05 AM  Result Value Ref Range   Sodium 145 136 - 145 mEq/L   Potassium 3.8 3.5 - 5.1 mEq/L   Chloride 109 98 - 109 mEq/L   CO2 28 22 - 29 mEq/L   Glucose 98 70 - 140 mg/dl    Comment: Glucose reference range is for nonfasting patients. Fasting glucose reference range is 70- 100.   BUN 17.5 7.0 - 26.0 mg/dL   Creatinine 0.9 0.6 - 1.1 mg/dL   Total Bilirubin 0.50 0.20 - 1.20 mg/dL   Alkaline Phosphatase 48 40 - 150 U/L   AST 25 5 - 34 U/L   ALT 21 0 - 55 U/L   Total Protein 6.5 6.4 - 8.3 g/dL   Albumin 3.6 3.5 - 5.0 g/dL   Calcium 8.9 8.4 - 10.4 mg/dL   Anion Gap 8 3 - 11 mEq/L   EGFR 63 (L) >90 ml/min/1.73 m2    Comment: eGFR is calculated using the CKD-EPI Creatinine Equation (2009)  POCT Urinalysis Dipstick (Automated)     Status: None   Collection Time: 02/29/16  4:21 PM  Result Value Ref Range   Color, UA yellow    Clarity, UA clear    Glucose, UA neg    Bilirubin, UA neg    Ketones, UA neg    Spec Grav, UA 1.025    Blood, UA neg    pH, UA 6.0    Protein, UA neg    Urobilinogen, UA 0.2    Nitrite, UA neg    Leukocytes, UA Negative Negative   Assessment/Plan: 1. Sacroiliac dysfunction Diagnosed by specialist. X-ray recommend. Will obtain today. If there is any SI joint changes, will consider HLA B27 assessment.  - DG Lumbar Spine Complete; Future  2. Chronic bilateral low back pain without sciatica Imaging today giving chronicity of symptoms. Rx diclofenac as patient intolerant to most pain relievers. Take with food.  - DG Lumbar Spine Complete; Future  3. Right hip pain Questionable bursitis as there is spot tenderness present. X-ray today. Referral to Sports Medicine for assessment and potential injection placed. - DG HIP UNILAT W OR W/O PELVIS 2-3 VIEWS RIGHT; Future  4. Injury of left lower leg, initial encounter No edema or swelling of extremity.  Significant injury occurring 1 month ago. Some mild Sq calcification palpable on exam. No sign of thrombophlebitis or SVT on exam. ROM intact and strength intact. Will obtain imaging to r/o fracture giving worsening symptoms. Pain medication reviewed. Sports Medicine referral in the works. - DG Tibia/Fibula Left; Future   Leeanne Rio, PA-C

## 2016-03-25 ENCOUNTER — Ambulatory Visit (INDEPENDENT_AMBULATORY_CARE_PROVIDER_SITE_OTHER): Payer: Medicare HMO | Admitting: Physician Assistant

## 2016-03-25 ENCOUNTER — Ambulatory Visit (HOSPITAL_BASED_OUTPATIENT_CLINIC_OR_DEPARTMENT_OTHER)
Admission: RE | Admit: 2016-03-25 | Discharge: 2016-03-25 | Disposition: A | Discharge status: Home / Self Care | Payer: Medicare HMO | Source: Ambulatory Visit | Attending: Physician Assistant | Admitting: Physician Assistant

## 2016-03-25 ENCOUNTER — Encounter: Payer: Self-pay | Admitting: Physician Assistant

## 2016-03-25 VITALS — BP 120/94 | HR 66 | Temp 98.0°F | Resp 16 | Ht 67.0 in | Wt 181.2 lb

## 2016-03-25 DIAGNOSIS — X58XXXA Exposure to other specified factors, initial encounter: Secondary | ICD-10-CM | POA: Diagnosis not present

## 2016-03-25 DIAGNOSIS — G8929 Other chronic pain: Secondary | ICD-10-CM | POA: Diagnosis not present

## 2016-03-25 DIAGNOSIS — M533 Sacrococcygeal disorders, not elsewhere classified: Secondary | ICD-10-CM | POA: Diagnosis not present

## 2016-03-25 DIAGNOSIS — M4807 Spinal stenosis, lumbosacral region: Secondary | ICD-10-CM | POA: Diagnosis not present

## 2016-03-25 DIAGNOSIS — S8992XA Unspecified injury of left lower leg, initial encounter: Secondary | ICD-10-CM | POA: Insufficient documentation

## 2016-03-25 DIAGNOSIS — M545 Low back pain, unspecified: Secondary | ICD-10-CM

## 2016-03-25 DIAGNOSIS — M25551 Pain in right hip: Secondary | ICD-10-CM | POA: Diagnosis not present

## 2016-03-25 DIAGNOSIS — M1288 Other specific arthropathies, not elsewhere classified, other specified site: Secondary | ICD-10-CM | POA: Insufficient documentation

## 2016-03-25 DIAGNOSIS — M4186 Other forms of scoliosis, lumbar region: Secondary | ICD-10-CM | POA: Diagnosis not present

## 2016-03-25 MED ORDER — DICLOFENAC SODIUM 75 MG PO TBEC: 75.0000 mg | DELAYED_RELEASE_TABLET | Freq: Two times a day (BID) | ORAL | 0 refills | Status: DC

## 2016-03-25 NOTE — Patient Instructions (Signed)
Please go downstairs for imaging. I will call with your results.  For the leg -- keep elevated while resting. An ACE wrap to add compression will likely help pain.  Take diclofenac as directed with food. We will be setting up Sports Medicine assessment for the leg and hip pending imaging results. Physical therapy will be of benefit but will get the Sports Medicine assessment first.

## 2016-03-25 NOTE — Progress Notes (Signed)
Pre visit review using our clinic review tool, if applicable. No additional management support is needed unless otherwise documented below in the visit note/SLS  

## 2016-03-26 ENCOUNTER — Ambulatory Visit (INDEPENDENT_AMBULATORY_CARE_PROVIDER_SITE_OTHER): Payer: Medicare HMO | Admitting: Family Medicine

## 2016-03-26 ENCOUNTER — Encounter: Payer: Self-pay | Admitting: Family Medicine

## 2016-03-26 ENCOUNTER — Telehealth: Payer: Self-pay | Admitting: Family Medicine

## 2016-03-26 DIAGNOSIS — M545 Low back pain, unspecified: Secondary | ICD-10-CM

## 2016-03-26 DIAGNOSIS — S86819A Strain of other muscle(s) and tendon(s) at lower leg level, unspecified leg, initial encounter: Secondary | ICD-10-CM | POA: Insufficient documentation

## 2016-03-26 DIAGNOSIS — S86812A Strain of other muscle(s) and tendon(s) at lower leg level, left leg, initial encounter: Secondary | ICD-10-CM | POA: Diagnosis not present

## 2016-03-26 DIAGNOSIS — M79604 Pain in right leg: Secondary | ICD-10-CM

## 2016-03-26 NOTE — Assessment & Plan Note (Signed)
brief MSK u/s reassuring with very mild swelling around medial gastroc muscle but no large tear.  Compression, ice/heat.  Tylenol or aleve.  Shown home exercises to do daily.  Consider physical therapy.

## 2016-03-26 NOTE — Progress Notes (Signed)
PCP: Leeanne Rio, PA-C  Subjective:   HPI: Patient is a 70 y.o. female here for right hip, back pain.  Patient denies known injury. She reports having pain in posterior right hip, low back area for at least 4 years. Pain radiates from this area down to the ankle. Describes pain as burning, 9/10 level of pain. She takes aleve. Associated numbness in right side down to knee No bowel/bladder dysfunction. History of melanoma and had breast cancer twice. No skin changes. She also asked me to take a look at her left medial calf - had coffee table fall on this a month ago.  Past Medical History:  Diagnosis Date  . Arthritis    "spine" (07/28/2014)  . Blood dyscrasia    bruises and bleed easily  . Breast cancer, left breast ([place]) 07/28/2014  . Breast cancer, right breast ([place]) 1995  . Chronic back pain    "all over"  . Complication of anesthesia    went to sleep easily but hard to wake up up until elbow OR in 2010  . Depression   . Dyspnea    Normal Spirometry 03/2008 EF 65% BNP normal 11/2007  . Family history of breast cancer   . Family history of colon cancer   . Family history of pancreatic cancer   . Gastric polyps   . GERD (gastroesophageal reflux disease)   . History of hiatal hernia   . Hx of adenomatous polyp of colon 07/03/2015  . Kidney stone    right kidney   . Lichen sclerosus   . Melanoma ([place]) 2010   "right elbow; treated at [facility]"  . Mild anxiety   . Vitamin B12 deficiency   . Vitamin D deficiency     Current Outpatient Prescriptions on File Prior to Visit  Medication Sig Dispense Refill  . anastrozole (ARIMIDEX) 1 MG tablet Take 1 tablet (1 mg total) by mouth daily. (Patient not taking: Reported on 03/25/2016) 90 tablet 4  . Cholecalciferol (VITAMIN D3) 1000 UNITS CAPS Take 1 capsule by mouth daily.      . citalopram (CELEXA) 10 MG tablet TAKE 1 TABLET (10 MG TOTAL) BY MOUTH DAILY. 30 tablet 2  . Cyanocobalamin (VITAMIN B-12 CR) 1000 MCG TBCR Take 1  tablet by mouth daily.      . diclofenac (VOLTAREN) 75 MG EC tablet Take 1 tablet (75 mg total) by mouth 2 (two) times daily. 30 tablet 0  . furosemide (LASIX) 40 MG tablet TAKE 1 TABLET (40 MG TOTAL) BY MOUTH DAILY. 90 tablet 0  . naproxen sodium (ANAPROX) 220 MG tablet Take 220 mg by mouth every morning.    Marland Kitchen OVER THE COUNTER MEDICATION Probiotic 90 billion-Take 1 tablet by mouth daily.    . [DISCONTINUED] sucralfate (CARAFATE) 1 GM/10ML suspension Take 1 g by mouth at bedtime as needed.       No current facility-administered medications on file prior to visit.     Past Surgical History:  Procedure Laterality Date  . BREAST BIOPSY Left 04/2014  . BREAST RECONSTRUCTION WITH PLACEMENT OF TISSUE EXPANDER AND FLEX HD (ACELLULAR HYDRATED DERMIS) Left 07/28/2014   Procedure: LEFT BREAST RECONSTRUCTION PLACEMENT OF LEFT TISSUE EXPANDER ;  Surgeon: Crissie Reese, MD;  Location: [place];  Service: Plastics;  Laterality: Left;  . BREAST RECONSTRUCTION WITH PLACEMENT OF TISSUE EXPANDER AND FLEX HD (ACELLULAR HYDRATED DERMIS) Left 09/08/2014   Procedure: REMOVAL OF TISSUE EXPANDER FROM LEFT BREAST;  Surgeon: Crissie Reese, MD;  Location: [place];  Service: Clinical cytogeneticist;  Laterality: Left;  . BUNIONECTOMY Bilateral 1970's  . COLONOSCOPY     Dr Sharlett Iles  . CYSTOSCOPY WITH RETROGRADE PYELOGRAM, URETEROSCOPY AND STENT PLACEMENT Right 06/09/2015   Procedure: CYSTOSCOPY WITH RIGHT RETROGRADE PYELOGRAM, RIGHT URETEROSCOPY AND RIGHT URTERAL STENT PLACEMENT;  Surgeon: Ardis Hughs, MD;  Location: [facility];  Service: Urology;  Laterality: Right;  . ESOPHAGOGASTRODUODENOSCOPY    . HERNIA REPAIR    . HOLMIUM LASER APPLICATION Right [id number]   Procedure: HOLMIUM LASER APPLICATION;  Surgeon: Ardis Hughs, MD;  Location: [facility];  Service: Urology;  Laterality: Right;  . LATISSIMUS FLAP TO BREAST Left 09/08/2014   Procedure: LEFT LATISSIMUS FLAP TO BREAST WITH SALINE IMPLANT FOR BREAST RECONSTRUCTION;  Surgeon: Crissie Reese, MD;  Location: [place];  Service: Plastics;  Laterality: Left;  Marland Kitchen MASTECTOMY Right 1996    chemotherapy. pt. states 13 lymph nodes were removed  . MASTECTOMY COMPLETE / SIMPLE W/ SENTINEL NODE BIOPSY Left 07/28/2014  . MASTECTOMY W/ SENTINEL NODE BIOPSY Left 07/28/2014   Procedure: LEFT MASTECTOMY WITH SENTINEL LYMPH NODE MAPPING;  Surgeon: Autumn Messing III, MD;  Location: [place];  Service: General;  Laterality: Left;  Marland Kitchen MELANOMA EXCISION Right 2010   From elbow-- Done at [facility]   . NISSEN FUNDOPLICATION  [id number]  . RECONSTRUCTION BREAST IMMEDIATE / DELAYED W/ TISSUE EXPANDER Left 07/28/2014  . TEMPOROMANDIBULAR JOINT SURGERY Bilateral 1987  . TONSILLECTOMY      Allergies  Allergen Reactions  . Silodosin Rash    Facial rash  . Ace Inhibitors Other (See Comments)    REACTION: angioedema  . Buprenorphine Hcl Other (See Comments)    "crazy"  . Morphine And Related Other (See Comments)    "crazy"  . Codeine Rash  . Sulfonamide Derivatives Rash  . Telmisartan Rash    Social History   Social History  . Marital status: Married    Spouse name: N/A  . Number of children: 2  . Years of education: N/A   Occupational History  . Manager    Social History Main Topics  . Smoking status: Never Smoker  . Smokeless tobacco: Never Used     Comment: Regular Exercise - Yes  . Alcohol use No  . Drug use: No  . Sexual activity: Yes   Other Topics Concern  . Not on file   Social History Narrative  . No narrative on file    Family History  Problem Relation Age of Onset  . Stroke Other     F 1st degree relative 42, M 1st degree relative  . Colon cancer Cousin 1    double first cousin  . Colon cancer Cousin 60    double first cousin  . Colon polyps Father     between 77-20  . Dementia Father   . Pancreatic cancer Brother 28  . Colon polyps Brother     between 10-20  . Cancer Paternal Uncle     NOS  . Anemia Paternal Grandfather     pernicious anemia  . Breast cancer Cousin 40     double first cousin  . Breast cancer Cousin 36    paternal cousin    BP 118/78   Pulse 76   Ht 5\' 8"  (1.727 m)   Wt 180 lb (81.6 kg)   BMI 27.37 kg/m   Review of Systems: See HPI above.    Objective:  Physical Exam:  Gen: NAD, comfortable in exam room  Back/right hip: No gross deformity,  scoliosis. TTP mildly right lumbar paraspinal area, buttock, throughout IT band.  No midline or bony TTP. FROM with pain on extension. Strength LEs 5/5 all muscle groups.   2+ MSRs in patellar and achilles tendons, equal bilaterally. Negative SLRs. Decreased sensation lateral right lower leg only. Negative logroll bilateral hips Negative fabers and piriformis stretches.  Left lower leg: No gross deformity, swelling, bruising. Mild TTP medial gastroc. FROM ankle.    Assessment & Plan:  1. Right low back pain - with radiation into right leg.  Consistent with radiculopathy.  Independently reviewed radiographs and degenerative changes noted.  We discussed options - longstanding low back pain and history of 3 separate malignancies - will go ahead with MRI.  We also discussed consideration of physical therapy, prednisone, nsaids.    2. Left calf strain - brief MSK u/s reassuring with very mild swelling around medial gastroc muscle but no large tear.  Compression, ice/heat.  Tylenol or aleve.  Shown home exercises to do daily.  Consider physical therapy.

## 2016-03-26 NOTE — Patient Instructions (Signed)
We will go ahead with an MRI of your lumbar spine and call you with the results the day after this.  You have a calf strain Compression sleeve or ace wrap to help with swelling and pain. Icing or heat for 15 minutes at a time 3-4 times a day Heel lifts either in temporary orthotics or on their own to prevent further strain. Tylenol and/or aleve for pain. Start ankle range of motion exercises (up/down and alphabet exercises). Theraband strengthening 3 sets of 10. Advance to calf raises 3 sets of 10 once a day and finally single leg calf raises. Do the exercises for 6 weeks.

## 2016-03-26 NOTE — Assessment & Plan Note (Signed)
with radiation into right leg.  Consistent with radiculopathy.  Independently reviewed radiographs and degenerative changes noted.  We discussed options - longstanding low back pain and history of 3 separate malignancies - will go ahead with MRI.  We also discussed consideration of physical therapy, prednisone, nsaids.

## 2016-03-27 NOTE — Telephone Encounter (Signed)
Spoke to patient and told her have to do a prior authorization.

## 2016-03-29 ENCOUNTER — Telehealth: Payer: Self-pay | Admitting: *Deleted

## 2016-03-29 NOTE — Telephone Encounter (Signed)
Request for Xray results signed by patient; faxed to [phone]/SLS 11/03

## 2016-04-01 ENCOUNTER — Telehealth: Payer: Self-pay | Admitting: Family Medicine

## 2016-04-01 NOTE — Telephone Encounter (Signed)
[name] can you call to check on this?  I'm surprised in a patient with several year history of back pain and history of 3 malignancies that it requires a peer to peer.

## 2016-04-02 ENCOUNTER — Telehealth: Payer: Self-pay | Admitting: Family Medicine

## 2016-04-02 MED FILL — DICLOFENAC SOD 75 MG TAB EC: 75 | 15 days supply | Qty: 30 | Fill #0

## 2016-04-02 MED FILL — CITALOPRAM HBR 10 MG TABLET: 10 | 30 days supply | Qty: 30 | Fill #1

## 2016-04-02 NOTE — Telephone Encounter (Signed)
Noted.  Nevin Bloodgood called them yesterday to provide information and they gave [place] this information.  I will contact them later when done with patients to do peer to peer and try to get this approved.  We will call her either way.

## 2016-04-02 NOTE — Telephone Encounter (Signed)
Spoke to [place] on 04-01-16 and gave more information and was still advised needed a peer to peer. Marland Kitchen

## 2016-04-03 ENCOUNTER — Telehealth: Payer: Self-pay | Admitting: *Deleted

## 2016-04-03 NOTE — Addendum Note (Signed)
Addended by: Sherrie George F on: 04/03/2016 11:01 AM   Modules accepted: Orders

## 2016-04-03 NOTE — Telephone Encounter (Signed)
MRI was denied after peer to peer as well.  She requires 4-6 weeks of conservative treatment with reevaluation before it would be approved.  So she would have to do physical therapy, consider prednisone (or a prescription anti-inflammatory) then come back in the office in 4-6 weeks to reexamine her.

## 2016-04-04 ENCOUNTER — Ambulatory Visit: Payer: Medicare HMO | Attending: Family Medicine | Admitting: Physical Therapy

## 2016-04-04 VITALS — Wt 180.0 lb

## 2016-04-04 DIAGNOSIS — M6281 Muscle weakness (generalized): Secondary | ICD-10-CM | POA: Diagnosis present

## 2016-04-04 DIAGNOSIS — R293 Abnormal posture: Secondary | ICD-10-CM | POA: Insufficient documentation

## 2016-04-04 DIAGNOSIS — G8929 Other chronic pain: Secondary | ICD-10-CM | POA: Diagnosis present

## 2016-04-04 DIAGNOSIS — M5441 Lumbago with sciatica, right side: Secondary | ICD-10-CM | POA: Diagnosis not present

## 2016-04-04 NOTE — Therapy (Signed)
[facility] [address]  [place], [place], [address] Phone: [phone]   Fax:  [phone]  Physical Therapy Evaluation  Patient Details  Name: [name] MRN: [id number] Date of Birth: 1945-12-17 Referring Provider: Dr. Karlton Lemon  Encounter Date: 04/04/2016      PT End of Session - 04/04/16 0921    Visit Number 1   Number of Visits 12   Date for PT Re-Evaluation 05/16/16   PT Start Time 0845   PT Stop Time 0920   PT Time Calculation (min) 35 min   Activity Tolerance Patient tolerated treatment well   Behavior During Therapy [facility] for tasks assessed/performed      Past Medical History:  Diagnosis Date  . Arthritis    "spine" (07/28/2014)  . Blood dyscrasia    bruises and bleed easily  . Breast cancer, left breast ([place]) 07/28/2014  . Breast cancer, right breast ([place]) 1995  . Chronic back pain    "all over"  . Complication of anesthesia    went to sleep easily but hard to wake up up until elbow OR in 2010  . Depression   . Dyspnea    Normal Spirometry 03/2008 EF 65% BNP normal 11/2007  . Family history of breast cancer   . Family history of colon cancer   . Family history of pancreatic cancer   . Gastric polyps   . GERD (gastroesophageal reflux disease)   . History of hiatal hernia   . Hx of adenomatous polyp of colon 07/03/2015  . Kidney stone    right kidney   . Lichen sclerosus   . Melanoma ([place]) 2010   "right elbow; treated at [facility]"  . Mild anxiety   . Vitamin B12 deficiency   . Vitamin D deficiency     Past Surgical History:  Procedure Laterality Date  . BREAST BIOPSY Left 04/2014  . BREAST RECONSTRUCTION WITH PLACEMENT OF TISSUE EXPANDER AND FLEX HD (ACELLULAR HYDRATED DERMIS) Left 07/28/2014   Procedure: LEFT BREAST RECONSTRUCTION PLACEMENT OF LEFT TISSUE EXPANDER ;  Surgeon: Crissie Reese, MD;  Location: [place];  Service: Plastics;  Laterality: Left;  . BREAST RECONSTRUCTION WITH  PLACEMENT OF TISSUE EXPANDER AND FLEX HD (ACELLULAR HYDRATED DERMIS) Left 09/08/2014   Procedure: REMOVAL OF TISSUE EXPANDER FROM LEFT BREAST;  Surgeon: Crissie Reese, MD;  Location: [place];  Service: Plastics;  Laterality: Left;  . BUNIONECTOMY Bilateral 1970's  . COLONOSCOPY     Dr Sharlett Iles  . CYSTOSCOPY WITH RETROGRADE PYELOGRAM, URETEROSCOPY AND STENT PLACEMENT Right 06/09/2015   Procedure: CYSTOSCOPY WITH RIGHT RETROGRADE PYELOGRAM, RIGHT URETEROSCOPY AND RIGHT URTERAL STENT PLACEMENT;  Surgeon: Ardis Hughs, MD;  [facility];  Service: Urology;  Laterality: Right;  . ESOPHAGOGASTRODUODENOSCOPY    . HERNIA REPAIR    . HOLMIUM LASER APPLICATION Right [id number]   Procedure: HOLMIUM LASER APPLICATION;  Surgeon: Ardis Hughs, MD;  [facility];  Service: Urology;  Laterality: Right;  . LATISSIMUS FLAP TO BREAST Left 09/08/2014   Procedure: LEFT LATISSIMUS FLAP TO BREAST WITH SALINE IMPLANT FOR BREAST RECONSTRUCTION;  Surgeon: Crissie Reese, MD;  Location: [place];  Service: Plastics;  Laterality: Left;  Marland Kitchen MASTECTOMY Right 1996    chemotherapy. pt. states 13 lymph nodes were removed  . MASTECTOMY COMPLETE / SIMPLE W/ SENTINEL NODE BIOPSY Left 07/28/2014  . MASTECTOMY W/ SENTINEL NODE BIOPSY Left 07/28/2014   Procedure: LEFT MASTECTOMY WITH SENTINEL LYMPH NODE MAPPING;  Surgeon: [name]  Daiva Nakayama, MD;  Location: [place];  Service: General;  Laterality: Left;  Marland Kitchen MELANOMA EXCISION Right 2010   From elbow-- Done at [facility]   . NISSEN FUNDOPLICATION  [id number]  . RECONSTRUCTION BREAST IMMEDIATE / DELAYED W/ TISSUE EXPANDER Left 07/28/2014  . TEMPOROMANDIBULAR JOINT SURGERY Bilateral 1987  . TONSILLECTOMY      Vitals:   04/04/16 0847  Weight: 180 lb (81.6 kg)         Subjective Assessment - 04/04/16 0847    Subjective Pt is a 70 y/o female who presents to OPPT with several year hx of intermittent LBP with radiating symptoms down into RLE.  Pt reports recent exacerbation x 4 months.  Pt c/o  numbness in RLE all the way to foot.  Pt at this time reports MD was wanting MRI but must try therapy first.   Pertinent History arthritis, Breast Cancer x 2, Melanoma (R elbow)   Limitations Sitting;Standing;Lifting;House hold activities   How long can you sit comfortably? 30-45 min   How long can you stand comfortably? 15 min   Diagnostic tests x ray: "2 slipped discs"   Patient Stated Goals improve flexibility, decrease pain, maintain active lifestyle   Currently in Pain? Yes   Pain Score 8    Pain Location Hip   Pain Orientation Right   Pain Descriptors / Indicators Burning;Aching;Numbness   Pain Type Chronic pain   Pain Radiating Towards to R foot   Pain Onset More than a month ago   Pain Frequency Constant   Aggravating Factors  sitting, standing   Pain Relieving Factors repositioning, walking, flexion stretches            [facility] PT Assessment - 04/04/16 0853      Assessment   Medical Diagnosis LBP   Referring Provider Dr. Karlton Lemon   Onset Date/Surgical Date --  4 months   Next MD Visit not currently scheduled   Prior Therapy none recently     Precautions   Precautions None     Restrictions   Weight Bearing Restrictions No     Balance Screen   Has the patient fallen in the past 6 months No   Has the patient had a decrease in activity level because of a fear of falling?  Yes   Is the patient reluctant to leave their home because of a fear of falling?  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   Leisure spend time with grandchildren, travel     Observation/Other Assessments   Focus on Therapeutic Outcomes  (FOTO)  52 (48% limited; predicted 42% limited)     Posture/Postural Control   Posture/Postural Control Postural limitations   Postural Limitations Decreased lumbar lordosis;Rounded Shoulders;Forward head     ROM / Strength   AROM / PROM / Strength AROM;Strength     AROM   AROM Assessment Site Lumbar   Lumbar Flexion 63   Lumbar Extension 18   Lumbar - Right Side Bend 38   Lumbar - Left Side Bend 34     Strength   Strength Assessment Site Hip;Knee;Ankle  Right/Left Hip Right;Left   Right Hip Flexion 4/5   Right Hip Extension 3+/5   Right Hip ABduction 4/5   Right Hip ADduction 5/5   Left Hip Flexion 4/5   Left Hip Extension 3+/5   Left Hip ABduction 4/5   Left Hip ADduction 5/5   Right/Left Knee Right;Left   Right Knee Flexion 4/5   Right Knee Extension 5/5   Left Knee Flexion 4/5   Left Knee Extension 5/5   Right/Left Ankle Right;Left   Right Ankle Dorsiflexion 5/5   Left Ankle Dorsiflexion 5/5     Palpation   Palpation comment tenderness along R SIJ     Special Tests    Special Tests Lumbar   Lumbar Tests Straight Leg Raise     Straight Leg Raise   Findings Negative   Comment bil                   [facility] Adult PT Treatment/Exercise - 04/04/16 0853      Exercises   Exercises Lumbar     Lumbar Exercises: Stretches   Passive Hamstring Stretch 1 rep;30 seconds   Passive Hamstring Stretch Limitations for HEP instruction; seated   Single Knee to Chest Stretch 1 rep;30 seconds   Single Knee to Chest Stretch Limitations for HEP instruction                PT Education - 04/04/16 0921    Education provided Yes   Education Details HEP   Person(s) Educated Patient   Methods Explanation;Demonstration;Handout   Comprehension Verbalized understanding;Returned demonstration;Need further instruction             PT Long Term Goals - 04/04/16 0924      PT LONG TERM GOAL #1   Title verbalize understanding of posture/body mechanics to  decrease risk of reinjury (05/16/16)   Time 6   Period Weeks   Status New     PT LONG TERM GOAL #2   Title independent with HEP (05/16/16)   Time 6   Period Weeks   Status New     PT LONG TERM GOAL #3   Title improve lumbar flexion to > 70 degrees for improved function and decreased pain (05/16/16)   Time 6   Period Weeks   Status New     PT LONG TERM GOAL #4   Title report centralization of symptoms for decreased pain and improved function (05/16/16)   Time 6   Period Weeks   Status New               Plan - 04/04/16 0921    Clinical Impression Statement Pt is a 70 y/o female who presents to OPPT for moderate complexity PT eval for LBP with radiating symptoms to RLE.  Pt demonstrates postural abnormalities, decreased strength and ROM affecting functional mobiilty.  Pt will benefit from PT to address deficits listed.   Rehab Potential Good   PT Frequency 2x / week   PT Duration 6 weeks   PT Treatment/Interventions ADLs/Self Care Home Management;Cryotherapy;Electrical Stimulation;Moist Heat;Traction;Ultrasound;Iontophoresis 4mg /ml Dexamethasone;Therapeutic exercise;Therapeutic activities;Functional mobility training;Stair training;Gait training;Patient/family education;Manual techniques;Taping;Dry needling   PT Next Visit Plan add core/hip strengthening to HEP, traction   Consulted and Agree with Plan of Care Patient      Patient will benefit from skilled therapeutic intervention in order to improve the following deficits and impairments:  Decreased strength, Postural dysfunction, Impaired flexibility, Decreased range of motion, Decreased mobility, Pain  Visit Diagnosis: Chronic right-sided low  back pain with right-sided sciatica  Abnormal posture  Muscle weakness (generalized)      G-Codes - 04-21-16 0927    Functional Assessment Tool Used FOTO 48%   Functional Limitation Mobility: Walking and moving around   Mobility: Walking and Moving Around Current Status  [phone]) At least 40 percent but less than 60 percent impaired, limited or restricted   Mobility: Walking and Moving Around Goal Status [phone]) At least 40 percent but less than 60 percent impaired, limited or restricted       Problem List Patient Active Problem List   Diagnosis Date Noted  . Strain of calf muscle 03/26/2016  . Hx of adenomatous polyp of colon 07/03/2015  . Visit for preventive health examination 06/13/2015  . Breast cancer ([place]) 09/08/2014  . 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 Genetic testing 06/29/2014  . Family history of breast cancer   . Family history of colon cancer   . Family history of pancreatic cancer   . Breast cancer, right breast ([place]) 05/18/2014  . Breast cancer of upper-outer quadrant of left female breast ([place]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  . Pain in the chest 05/16/2014  . 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 Peripheral neuropathy ([place]) 01/01/2013  . Melanoma of upper arm ([place]) 02/10/2012  . S/P laparoscopic fundoplication [id number]  . Fatigue 03/06/2011  . Low back pain radiating to right leg 02/06/2011  . Depression 05/22/2010  . FATTY LIVER DISEASE 12/07/2009  . COAGULOPATHY 07/12/2008  . CONSTIPATION 07/12/2008  . CHEST XRAY, ABNORMAL 01/04/2008  . Vitamin D deficiency 04/14/2007  . VITAMIN B12 DEFICIENCY 02/27/2007  . Essential hypertension 02/27/2007  . GERD 02/27/2007  . Fibromyalgia 02/27/2007       Laureen Abrahams, PT, DPT Apr 21, 2016 9:29 AM    [facility] [address]  [place], [place], [address] Phone: [phone]   Fax:  [phone]  Name: [name] MRN: [id number] Date of Birth: 08/07/1945

## 2016-04-04 NOTE — Patient Instructions (Signed)
  Knee to Chest (Flexion)    Pull knee toward chest. Feel stretch in lower back or buttock area. Breathing deeply, Hold _30___ seconds. Repeat with other knee. Repeat _2-3___ times. Do __2-3__ sessions per day.  http://gt2.exer.us/226   Copyright  VHI. All rights reserved.   HIP: Hamstrings - Short Sitting    Rest leg on raised surface. Keep knee straight. Lift chest. Hold _30__ seconds. __2-3_ reps per set, _2-3__ sets per day, _6-7__ days per week  Copyright  VHI. All rights reserved.

## 2016-04-05 ENCOUNTER — Telehealth: Payer: Self-pay | Admitting: Family Medicine

## 2016-04-05 NOTE — Telephone Encounter (Signed)
We are aware.  I did the peer to peer already and it was still denied.  The physician stated she needed to do 6 weeks of conservative therapy so she's starting physical therapy and will come back after she has completed that.

## 2016-04-08 ENCOUNTER — Ambulatory Visit: Payer: Medicare HMO | Admitting: Physical Therapy

## 2016-04-08 DIAGNOSIS — M5441 Lumbago with sciatica, right side: Principal | ICD-10-CM

## 2016-04-08 DIAGNOSIS — R293 Abnormal posture: Secondary | ICD-10-CM

## 2016-04-08 DIAGNOSIS — M6281 Muscle weakness (generalized): Secondary | ICD-10-CM

## 2016-04-08 DIAGNOSIS — G8929 Other chronic pain: Secondary | ICD-10-CM

## 2016-04-08 NOTE — Patient Instructions (Signed)
  PELVIC STABILIZATION: Pelvic Tilt (Lying)    Exhaling, pull belly toward spine, tilting pelvis forward. Inhaling, release. Hold for 5 seconds. Repeat _10__ times. Do _2-3__ times per day.  Copyright  VHI. All rights reserved.    Knee Drop   Keep pelvis stable. Without rotating hips, slowly drop knee to side, pause, return to center, bring knee across midline toward opposite hip. Feel obliques engaging. Repeat for ___10_ times each leg.  Bridging    Slowly raise buttocks from floor, keeping stomach tight.  Hold for 5 seconds. Repeat _10___ times per set. Do __1__ sets per session. Do __2-3__ sessions per day.  http://orth.exer.us/1097   Copyright  VHI. All rights reserved.    Strengthening: Hip Abductor - Resisted    With band looped around both legs above knees, push one leg out keeping the other still.   Repeat _10___ times on each side per set. Do __1__ sets per session. Do __2-3__ sessions per day.  http://orth.exer.us/689   Copyright  VHI. All rights reserved.

## 2016-04-08 NOTE — Therapy (Signed)
[facility] [address]  [place], [place], [address] Phone: [phone]   Fax:  [phone]  Physical Therapy Treatment  Patient Details  Name: [name] MRN: [id number] Date of Birth: 1946-01-12 Referring Provider: Dr. Karlton Lemon  Encounter Date: 04/08/2016      PT End of Session - 04/08/16 0842    Visit Number 2   Number of Visits 12   Date for PT Re-Evaluation 05/16/16   PT Start Time 0800   PT Stop Time 0849   PT Time Calculation (min) 49 min   Activity Tolerance Patient tolerated treatment well   Behavior During Therapy [facility] for tasks assessed/performed      Past Medical History:  Diagnosis Date  . Arthritis    "spine" (07/28/2014)  . Blood dyscrasia    bruises and bleed easily  . Breast cancer, left breast ([place]) 07/28/2014  . Breast cancer, right breast ([place]) 1995  . Chronic back pain    "all over"  . Complication of anesthesia    went to sleep easily but hard to wake up up until elbow OR in 2010  . Depression   . Dyspnea    Normal Spirometry 03/2008 EF 65% BNP normal 11/2007  . Family history of breast cancer   . Family history of colon cancer   . Family history of pancreatic cancer   . Gastric polyps   . GERD (gastroesophageal reflux disease)   . History of hiatal hernia   . Hx of adenomatous polyp of colon 07/03/2015  . Kidney stone    right kidney   . Lichen sclerosus   . Melanoma ([place]) 2010   "right elbow; treated at [facility]"  . Mild anxiety   . Vitamin B12 deficiency   . Vitamin D deficiency     Past Surgical History:  Procedure Laterality Date  . BREAST BIOPSY Left 04/2014  . BREAST RECONSTRUCTION WITH PLACEMENT OF TISSUE EXPANDER AND FLEX HD (ACELLULAR HYDRATED DERMIS) Left 07/28/2014   Procedure: LEFT BREAST RECONSTRUCTION PLACEMENT OF LEFT TISSUE EXPANDER ;  Surgeon: Crissie Reese, MD;  [place];  Service: Plastics;  Laterality: Left;  . BREAST RECONSTRUCTION WITH  PLACEMENT OF TISSUE EXPANDER AND FLEX HD (ACELLULAR HYDRATED DERMIS) Left 09/08/2014   Procedure: REMOVAL OF TISSUE EXPANDER FROM LEFT BREAST;  Surgeon: Crissie Reese, MD;  Location: [place];  Service: Plastics;  Laterality: Left;  . BUNIONECTOMY Bilateral 1970's  . COLONOSCOPY     Dr Sharlett Iles  . CYSTOSCOPY WITH RETROGRADE PYELOGRAM, URETEROSCOPY AND STENT PLACEMENT Right 06/09/2015   Procedure: CYSTOSCOPY WITH RIGHT RETROGRADE PYELOGRAM, RIGHT URETEROSCOPY AND RIGHT URTERAL STENT PLACEMENT;  Surgeon: Ardis Hughs, MD;  [facility];  Service: Urology;  Laterality: Right;  . ESOPHAGOGASTRODUODENOSCOPY    . HERNIA REPAIR    . HOLMIUM LASER APPLICATION Right [id number]   Procedure: HOLMIUM LASER APPLICATION;  Surgeon: Ardis Hughs, MD;  [facility];  Service: Urology;  Laterality: Right;  . LATISSIMUS FLAP TO BREAST Left 09/08/2014   Procedure: LEFT LATISSIMUS FLAP TO BREAST WITH SALINE IMPLANT FOR BREAST RECONSTRUCTION;  Surgeon: Crissie Reese, MD;  Location: [place];  Service: Plastics;  Laterality: Left;  Marland Kitchen MASTECTOMY Right 1996    chemotherapy. pt. states 13 lymph nodes were removed  . MASTECTOMY COMPLETE / SIMPLE W/ SENTINEL NODE BIOPSY Left 07/28/2014  . MASTECTOMY W/ SENTINEL NODE BIOPSY Left 07/28/2014   Procedure: LEFT MASTECTOMY WITH SENTINEL LYMPH NODE MAPPING;  Surgeon: [name]  Daiva Nakayama, MD;  Location: [place];  Service: General;  Laterality: Left;  Marland Kitchen MELANOMA EXCISION Right 2010   From elbow-- Done at [facility]   . NISSEN FUNDOPLICATION  [id number]  . RECONSTRUCTION BREAST IMMEDIATE / DELAYED W/ TISSUE EXPANDER Left 07/28/2014  . TEMPOROMANDIBULAR JOINT SURGERY Bilateral 1987  . TONSILLECTOMY      There were no vitals filed for this visit.      Subjective Assessment - 04/08/16 0801    Subjective back was feeling okay until she had a burning sensation this morning (lasted about 1 hour then resolved after exercise). did a lot of sitting this weekend.     Pertinent History arthritis,  Breast Cancer x 2, Melanoma (R elbow)   Patient Stated Goals improve flexibility, decrease pain, maintain active lifestyle   Currently in Pain? No/denies                         [facility] Adult PT Treatment/Exercise - 04/08/16 0802      Lumbar Exercises: Stretches   Single Knee to Chest Stretch 3 reps;30 seconds   Single Knee to Chest Stretch Limitations bil     Lumbar Exercises: Aerobic   Stationary Bike NuStep L5 x 6 min     Lumbar Exercises: Supine   Ab Set 10 reps;5 seconds   Clam 10 reps   Clam Limitations with ab set; x 10 with green theraband   Bridge 10 reps;5 seconds     Modalities   Modalities Traction     Traction   Type of Traction Lumbar   Min (lbs) 40   Max (lbs) 55   Hold Time 60   Rest Time 20   Time 15                PT Education - 04/08/16 0842    Education provided No   Education Details HEP   Person(s) Educated Patient   Methods Explanation;Demonstration;Handout   Comprehension Verbalized understanding;Returned demonstration;Need further instruction             PT Long Term Goals - 04/08/16 0845      PT LONG TERM GOAL #1   Title verbalize understanding of posture/body mechanics to decrease risk of reinjury (05/16/16)   Status On-going     PT LONG TERM GOAL #2   Title independent with HEP (05/16/16)   Status On-going     PT LONG TERM GOAL #3   Title improve lumbar flexion to > 70 degrees for improved function and decreased pain (05/16/16)   Status On-going     PT LONG TERM GOAL #4   Title report centralization of symptoms for decreased pain and improved function (05/16/16)   Status On-going               Plan - 04/08/16 0845    Clinical Impression Statement Initiated traction today as pt with some radicular pain and will see if pt has improvement in symptoms.  Tolerated core/hip strengthening exercises well and issued to HEP.  Will continue to benefit from PT to maximize function.   PT  Treatment/Interventions ADLs/Self Care Home Management;Cryotherapy;Electrical Stimulation;Moist Heat;Traction;Ultrasound;Iontophoresis 4mg /ml Dexamethasone;Therapeutic exercise;Therapeutic activities;Functional mobility training;Stair training;Gait training;Patient/family education;Manual techniques;Taping;Dry needling   PT Next Visit Plan review HEP, continue hip/core strengthening, stretches PRN, traction   Consulted and Agree with Plan of Care Patient      Patient will benefit from skilled therapeutic intervention in order to improve the following deficits and impairments:  Decreased  strength, Postural dysfunction, Impaired flexibility, Decreased range of motion, Decreased mobility, Pain  Visit Diagnosis: Chronic right-sided low back pain with right-sided sciatica  Abnormal posture  Muscle weakness (generalized)     Problem List Patient Active Problem List   Diagnosis Date Noted  . Strain of calf muscle 03/26/2016  . Hx of adenomatous polyp of colon 07/03/2015  . Visit for preventive health examination 06/13/2015  . Breast cancer ([place]) 09/08/2014  . 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 Genetic testing 06/29/2014  . Family history of breast cancer   . Family history of colon cancer   . Family history of pancreatic cancer   . Breast cancer, right breast ([place]) 05/18/2014  . Breast cancer of upper-outer quadrant of left female breast ([place]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  . Pain in the chest 05/16/2014  . 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 Peripheral neuropathy ([place]) 01/01/2013  . Melanoma of upper arm ([place]) 02/10/2012  . S/P laparoscopic fundoplication [id number]  . Fatigue 03/06/2011  . Low back pain radiating to right leg 02/06/2011  . Depression 05/22/2010  . FATTY LIVER DISEASE 12/07/2009  . COAGULOPATHY 07/12/2008  . CONSTIPATION 07/12/2008  . CHEST XRAY, ABNORMAL 01/04/2008  . Vitamin D deficiency 04/14/2007  . VITAMIN B12 DEFICIENCY 02/27/2007  . Essential  hypertension 02/27/2007  . GERD 02/27/2007  . Fibromyalgia 02/27/2007       Laureen Abrahams, PT, DPT 04/08/16 8:52 AM    [facility] [address]  [place], [place], [address] Phone: [phone]   Fax:  [phone]  Name: [name] MRN: [id number] Date of Birth: 1945-11-27

## 2016-04-11 ENCOUNTER — Ambulatory Visit: Payer: Medicare HMO

## 2016-04-15 ENCOUNTER — Ambulatory Visit: Payer: Medicare HMO | Admitting: Physical Therapy

## 2016-04-15 DIAGNOSIS — R293 Abnormal posture: Secondary | ICD-10-CM

## 2016-04-15 DIAGNOSIS — G8929 Other chronic pain: Secondary | ICD-10-CM

## 2016-04-15 DIAGNOSIS — M5441 Lumbago with sciatica, right side: Secondary | ICD-10-CM | POA: Diagnosis not present

## 2016-04-15 DIAGNOSIS — M6281 Muscle weakness (generalized): Secondary | ICD-10-CM

## 2016-04-15 NOTE — Therapy (Signed)
[facility] [address]  [place], [place], [address] Phone: [phone]   Fax:  [phone]  Physical Therapy Treatment  Patient Details  Name: [name] MRN: [id number] Date of Birth: [birth year] Referring Provider: Dr. Karlton Lemon  Encounter Date: 04/15/2016      PT End of Session - 04/15/16 0835    Visit Number 3   Number of Visits 12   Date for PT Re-Evaluation 05/16/16   PT Start Time 0753   PT Stop Time 0848   PT Time Calculation (min) 55 min   Activity Tolerance Patient tolerated treatment well   Behavior During Therapy [facility] for tasks assessed/performed      Past Medical History:  Diagnosis Date  . Arthritis    "spine" (07/28/2014)  . Blood dyscrasia    bruises and bleed easily  . Breast cancer, left breast ([place]) 07/28/2014  . Breast cancer, right breast ([place]) 1995  . Chronic back pain    "all over"  . Complication of anesthesia    went to sleep easily but hard to wake up up until elbow OR in 2010  . Depression   . Dyspnea    Normal Spirometry 03/2008 EF 65% BNP normal 11/2007  . Family history of breast cancer   . Family history of colon cancer   . Family history of pancreatic cancer   . Gastric polyps   . GERD (gastroesophageal reflux disease)   . History of hiatal hernia   . Hx of adenomatous polyp of colon 07/03/2015  . Kidney stone    right kidney   . Lichen sclerosus   . Melanoma ([place]) 2010   "right elbow; treated at [facility]"  . Mild anxiety   . Vitamin B12 deficiency   . Vitamin D deficiency     Past Surgical History:  Procedure Laterality Date  . BREAST BIOPSY Left 04/2014  . BREAST RECONSTRUCTION WITH PLACEMENT OF TISSUE EXPANDER AND FLEX HD (ACELLULAR HYDRATED DERMIS) Left 07/28/2014   Procedure: LEFT BREAST RECONSTRUCTION PLACEMENT OF LEFT TISSUE EXPANDER ;  Surgeon: Crissie Reese, MD;  Location: [place];  Service: Plastics;  Laterality: Left;  . BREAST RECONSTRUCTION WITH  PLACEMENT OF TISSUE EXPANDER AND FLEX HD (ACELLULAR HYDRATED DERMIS) Left 09/08/2014   Procedure: REMOVAL OF TISSUE EXPANDER FROM LEFT BREAST;  Surgeon: Crissie Reese, MD;  Location: [place];  Service: Plastics;  Laterality: Left;  . BUNIONECTOMY Bilateral 1970's  . COLONOSCOPY     Dr Sharlett Iles  . CYSTOSCOPY WITH RETROGRADE PYELOGRAM, URETEROSCOPY AND STENT PLACEMENT Right 06/09/2015   Procedure: CYSTOSCOPY WITH RIGHT RETROGRADE PYELOGRAM, RIGHT URETEROSCOPY AND RIGHT URTERAL STENT PLACEMENT;  Surgeon: Ardis Hughs, MD;  [facility];  Service: Urology;  Laterality: Right;  . ESOPHAGOGASTRODUODENOSCOPY    . HERNIA REPAIR    . HOLMIUM LASER APPLICATION Right [id number]   Procedure: HOLMIUM LASER APPLICATION;  Surgeon: Ardis Hughs, MD;  [facility];  Service: Urology;  Laterality: Right;  . LATISSIMUS FLAP TO BREAST Left 09/08/2014   Procedure: LEFT LATISSIMUS FLAP TO BREAST WITH SALINE IMPLANT FOR BREAST RECONSTRUCTION;  Surgeon: Crissie Reese, MD;  Location: [place];  Service: Plastics;  Laterality: Left;  Marland Kitchen MASTECTOMY Right 1996    chemotherapy. pt. states 13 lymph nodes were removed  . MASTECTOMY COMPLETE / SIMPLE W/ SENTINEL NODE BIOPSY Left 07/28/2014  . MASTECTOMY W/ SENTINEL NODE BIOPSY Left 07/28/2014   Procedure: LEFT MASTECTOMY WITH SENTINEL LYMPH NODE MAPPING;  Surgeon: [name]  Daiva Nakayama, MD;  Location: [place];  Service: General;  Laterality: Left;  Marland Kitchen MELANOMA EXCISION Right 2010   From elbow-- Done at [facility]   . NISSEN FUNDOPLICATION  [id number]  . RECONSTRUCTION BREAST IMMEDIATE / DELAYED W/ TISSUE EXPANDER Left 07/28/2014  . TEMPOROMANDIBULAR JOINT SURGERY Bilateral 1987  . TONSILLECTOMY      There were no vitals filed for this visit.      Subjective Assessment - 04/15/16 0755    Subjective was sick last week; managed to do exercises once a day though.  still feeling a little under the weather but much better.  reports back is more stiff; more pain Saturday in back to groin.   R leg is numb this morning.     Pertinent History arthritis, Breast Cancer x 2, Melanoma (R elbow)   Limitations Sitting;Standing;Lifting;House hold activities   Patient Stated Goals improve flexibility, decrease pain, maintain active lifestyle   Currently in Pain? Yes   Pain Score 4    Pain Location Back  hip/leg   Pain Orientation Right   Pain Descriptors / Indicators Burning;Aching;Numbness   Pain Type Chronic pain   Pain Radiating Towards RLE   Pain Onset More than a month ago   Pain Frequency Constant   Aggravating Factors  sitting, standing   Pain Relieving Factors repositioning, walking, flexion stretches                         [facility] Adult PT Treatment/Exercise - 04/15/16 0758      Exercises   Exercises Knee/Hip     Lumbar Exercises: Aerobic   Stationary Bike NuStep L6 x 6 min     Lumbar Exercises: Standing   Other Standing Lumbar Exercises standing extension x10     Lumbar Exercises: Supine   Ab Set 10 reps;5 seconds   Clam 10 reps   Clam Limitations with ab set; x 10 with green theraband   Bridge 10 reps;5 seconds     Knee/Hip Exercises: Standing   Heel Raises Both;2 sets;10 reps   Heel Raises Limitations toe raises 2x10   Hip Flexion Both;2 sets;10 reps;Knee bent   Hip Flexion Limitations 2#   Hip Abduction Both;2 sets;10 reps;Knee straight   Abduction Limitations 2#   Hip Extension Both;2 sets;10 reps   Extension Limitations 2#     Modalities   Modalities Traction     Traction   Type of Traction Lumbar   Min (lbs) 55   Max (lbs) 65   Hold Time 60   Rest Time 20   Time 15                     PT Long Term Goals - 04/08/16 0845      PT LONG TERM GOAL #1   Title verbalize understanding of posture/body mechanics to decrease risk of reinjury (05/16/16)   Status On-going     PT LONG TERM GOAL #2   Title independent with HEP (05/16/16)   Status On-going     PT LONG TERM GOAL #3   Title improve lumbar flexion to >  70 degrees for improved function and decreased pain (05/16/16)   Status On-going     PT LONG TERM GOAL #4   Title report centralization of symptoms for decreased pain and improved function (05/16/16)   Status On-going               Plan - 04/15/16 [id number]  Clinical Impression Statement Pt reports traction helpful last session.  No new exercises given as pt with limited compliance due to illness last week.  Will monitor need to progress exercises as tolerated.  Will continue to benefit from PT to maximize function.   PT Treatment/Interventions ADLs/Self Care Home Management;Cryotherapy;Electrical Stimulation;Moist Heat;Traction;Ultrasound;Iontophoresis 4mg /ml Dexamethasone;Therapeutic exercise;Therapeutic activities;Functional mobility training;Stair training;Gait training;Patient/family education;Manual techniques;Taping;Dry needling   PT Next Visit Plan continue hip/core strengthening, stretches PRN, traction   Consulted and Agree with Plan of Care Patient      Patient will benefit from skilled therapeutic intervention in order to improve the following deficits and impairments:  Decreased strength, Postural dysfunction, Impaired flexibility, Decreased range of motion, Decreased mobility, Pain  Visit Diagnosis: Chronic right-sided low back pain with right-sided sciatica  Abnormal posture  Muscle weakness (generalized)     Problem List Patient Active Problem List   Diagnosis Date Noted  . Strain of calf muscle 03/26/2016  . Hx of adenomatous polyp of colon 07/03/2015  . Visit for preventive health examination 06/13/2015  . Breast cancer ([place]) 09/08/2014  . 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 Genetic testing 06/29/2014  . Family history of breast cancer   . Family history of colon cancer   . Family history of pancreatic cancer   . Breast cancer, right breast ([place]) 05/18/2014  . Breast cancer of upper-outer quadrant of left female breast ([place]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  .  Pain in the chest 05/16/2014  . 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 Peripheral neuropathy ([place]) 01/01/2013  . Melanoma of upper arm ([place]) 02/10/2012  . S/P laparoscopic fundoplication [id number]  . Fatigue 03/06/2011  . Low back pain radiating to right leg 02/06/2011  . Depression 05/22/2010  . FATTY LIVER DISEASE 12/07/2009  . COAGULOPATHY 07/12/2008  . CONSTIPATION 07/12/2008  . CHEST XRAY, ABNORMAL 01/04/2008  . Vitamin D deficiency 04/14/2007  . VITAMIN B12 DEFICIENCY 02/27/2007  . Essential hypertension 02/27/2007  . GERD 02/27/2007  . Fibromyalgia 02/27/2007       Laureen Abrahams, PT, DPT 04/15/16 8:37 AM    [facility] [address]  [place], [place], [address] Phone: [phone]   Fax:  [phone]  Name: ELDENE BLONIGEN MRN: [id number] Date of Birth: [birth year]

## 2016-04-17 ENCOUNTER — Ambulatory Visit: Payer: Medicare HMO | Admitting: Physical Therapy

## 2016-04-17 DIAGNOSIS — G8929 Other chronic pain: Secondary | ICD-10-CM

## 2016-04-17 DIAGNOSIS — R293 Abnormal posture: Secondary | ICD-10-CM

## 2016-04-17 DIAGNOSIS — M5441 Lumbago with sciatica, right side: Principal | ICD-10-CM

## 2016-04-17 DIAGNOSIS — M6281 Muscle weakness (generalized): Secondary | ICD-10-CM

## 2016-04-17 NOTE — Therapy (Signed)
[facility] [address]  [place], [place], [address] Phone: [phone]   Fax:  [phone]  Physical Therapy Treatment  Patient Details  Name: [name] MRN: [id number] Date of Birth: 1946/04/09 Referring Provider: Dr. Karlton Lemon  Encounter Date: 04/17/2016      PT End of Session - 04/17/16 0757    Visit Number 4   Number of Visits 12   Date for PT Re-Evaluation 05/16/16   PT Start Time 0752   PT Stop Time 0854   PT Time Calculation (min) 62 min   Activity Tolerance Patient tolerated treatment well   Behavior During Therapy [facility] for tasks assessed/performed      Past Medical History:  Diagnosis Date  . Arthritis    "spine" (07/28/2014)  . Blood dyscrasia    bruises and bleed easily  . Breast cancer, left breast ([place]) 07/28/2014  . Breast cancer, right breast ([place]) 1995  . Chronic back pain    "all over"  . Complication of anesthesia    went to sleep easily but hard to wake up up until elbow OR in 2010  . Depression   . Dyspnea    Normal Spirometry 03/2008 EF 65% BNP normal 11/2007  . Family history of breast cancer   . Family history of colon cancer   . Family history of pancreatic cancer   . Gastric polyps   . GERD (gastroesophageal reflux disease)   . History of hiatal hernia   . Hx of adenomatous polyp of colon 07/03/2015  . Kidney stone    right kidney   . Lichen sclerosus   . Melanoma ([place]) 2010   "right elbow; treated at [facility]"  . Mild anxiety   . Vitamin B12 deficiency   . Vitamin D deficiency     Past Surgical History:  Procedure Laterality Date  . BREAST BIOPSY Left 04/2014  . BREAST RECONSTRUCTION WITH PLACEMENT OF TISSUE EXPANDER AND FLEX HD (ACELLULAR HYDRATED DERMIS) Left 07/28/2014   Procedure: LEFT BREAST RECONSTRUCTION PLACEMENT OF LEFT TISSUE EXPANDER ;  Surgeon: Crissie Reese, MD;  Location: [place];  Service: Plastics;  Laterality: Left;  . BREAST RECONSTRUCTION WITH  PLACEMENT OF TISSUE EXPANDER AND FLEX HD (ACELLULAR HYDRATED DERMIS) Left 09/08/2014   Procedure: REMOVAL OF TISSUE EXPANDER FROM LEFT BREAST;  Surgeon: Crissie Reese, MD;  Location: [place];  Service: Plastics;  Laterality: Left;  . BUNIONECTOMY Bilateral 1970's  . COLONOSCOPY     Dr Sharlett Iles  . CYSTOSCOPY WITH RETROGRADE PYELOGRAM, URETEROSCOPY AND STENT PLACEMENT Right 06/09/2015   Procedure: CYSTOSCOPY WITH RIGHT RETROGRADE PYELOGRAM, RIGHT URETEROSCOPY AND RIGHT URTERAL STENT PLACEMENT;  Surgeon: Ardis Hughs, MD;  [facility];  Service: Urology;  Laterality: Right;  . ESOPHAGOGASTRODUODENOSCOPY    . HERNIA REPAIR    . HOLMIUM LASER APPLICATION Right [id number]   Procedure: HOLMIUM LASER APPLICATION;  Surgeon: Ardis Hughs, MD;  [facility];  Service: Urology;  Laterality: Right;  . LATISSIMUS FLAP TO BREAST Left 09/08/2014   Procedure: LEFT LATISSIMUS FLAP TO BREAST WITH SALINE IMPLANT FOR BREAST RECONSTRUCTION;  Surgeon: Crissie Reese, MD;  Location: [place];  Service: Plastics;  Laterality: Left;  Marland Kitchen MASTECTOMY Right 1996    chemotherapy. pt. states 13 lymph nodes were removed  . MASTECTOMY COMPLETE / SIMPLE W/ SENTINEL NODE BIOPSY Left 07/28/2014  . MASTECTOMY W/ SENTINEL NODE BIOPSY Left 07/28/2014   Procedure: LEFT MASTECTOMY WITH SENTINEL LYMPH NODE MAPPING;  Surgeon: [name]  Daiva Nakayama, MD;  Location: [place];  Service: General;  Laterality: Left;  Marland Kitchen MELANOMA EXCISION Right 2010   From elbow-- Done [facility]   . NISSEN FUNDOPLICATION  [id number]  . RECONSTRUCTION BREAST IMMEDIATE / DELAYED W/ TISSUE EXPANDER Left 07/28/2014  . TEMPOROMANDIBULAR JOINT SURGERY Bilateral 1987  . TONSILLECTOMY      There were no vitals filed for this visit.      Subjective Assessment - 04/17/16 0752    Subjective Pain is very mild today - only slight numbness in R hip; feels like traction is of great benefit to her.    Pertinent History arthritis, Breast Cancer x 2, Melanoma (R elbow)    Limitations Sitting;Standing;Lifting;House hold activities   Patient Stated Goals improve flexibility, decrease pain, maintain active lifestyle   Currently in Pain? Yes   Pain Score 1    Pain Orientation Right   Pain Descriptors / Indicators Burning;Aching;Numbness   Pain Type Chronic pain   Pain Radiating Towards R LE   Pain Onset More than a month ago   Pain Frequency Constant   Aggravating Factors  sititng, standing   Pain Relieving Factors repositioning, walking, flexion stretches                         [facility] Adult PT Treatment/Exercise - 04/17/16 0755      Lumbar Exercises: Aerobic   Stationary Bike L6 x 6 minutes     Lumbar Exercises: Supine   Ab Set 10 reps;5 seconds  hooklying, arms overhead   AB Set Limitations progression to 90/90 hip and knee flexion with feet off of table 10 reps; 5 seconds    Clam 10 reps  2 sets   Clam Limitations with ab set; x 10 with green theraband   Bridge 10 reps;5 seconds   Bridge Limitations green tband at knees     Lumbar Exercises: Sidelying   Hip Abduction 10 reps  2 sets     Knee/Hip Exercises: Standing   Heel Raises Both;2 sets;15 reps   Heel Raises Limitations toe raises 2x15   Hip Flexion Both;2 sets;15 reps;Knee bent   Hip Flexion Limitations 3#   Hip Abduction Both;2 sets;15 reps;Knee straight   Abduction Limitations 3#   Hip Extension Both;2 sets;15 reps   Extension Limitations 3#     Modalities   Modalities Traction     Traction   Type of Traction Lumbar   Min (lbs) 60   Max (lbs) 70   Hold Time 60   Rest Time 20   Time 15                     PT Long Term Goals - 04/08/16 0845      PT LONG TERM GOAL #1   Title verbalize understanding of posture/body mechanics to decrease risk of reinjury (05/16/16)   Status On-going     PT LONG TERM GOAL #2   Title independent with HEP (05/16/16)   Status On-going     PT LONG TERM GOAL #3   Title improve lumbar flexion to > 70 degrees  for improved function and decreased pain (05/16/16)   Status On-going     PT LONG TERM GOAL #4   Title report centralization of symptoms for decreased pain and improved function (05/16/16)   Status On-going               Plan - 04/17/16 0757    Clinical Impression  Statement Patient continuing to report good benefit from traction with very mild pain today (1/10) with very little numbness. Progression of weight with standing exercises with no increase in pain as well as progression of core stabilization tasks. Patient continues to be compliant with HEP. Patient to continue to benefit from PT to progress function and mobility.    PT Treatment/Interventions ADLs/Self Care Home Management;Cryotherapy;Electrical Stimulation;Moist Heat;Traction;Ultrasound;Iontophoresis 4mg /ml Dexamethasone;Therapeutic exercise;Therapeutic activities;Functional mobility training;Stair training;Gait training;Patient/family education;Manual techniques;Taping;Dry needling   PT Next Visit Plan continue hip/core strengthening, stretches PRN, traction   Consulted and Agree with Plan of Care Patient      Patient will benefit from skilled therapeutic intervention in order to improve the following deficits and impairments:  Decreased strength, Postural dysfunction, Impaired flexibility, Decreased range of motion, Decreased mobility, Pain  Visit Diagnosis: Chronic right-sided low back pain with right-sided sciatica  Abnormal posture  Muscle weakness (generalized)     Problem List Patient Active Problem List   Diagnosis Date Noted  . Strain of calf muscle 03/26/2016  . Hx of adenomatous polyp of colon 07/03/2015  . Visit for preventive health examination 06/13/2015  . Breast cancer ([place]) 09/08/2014  . 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 Genetic testing 06/29/2014  . Family history of breast cancer   . Family history of colon cancer   . Family history of pancreatic cancer   . Breast cancer,  right breast ([place]) 05/18/2014  . Breast cancer of upper-outer quadrant of left female breast ([place]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  . Pain in the chest 05/16/2014  . 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 Peripheral neuropathy ([place]) 01/01/2013  . Melanoma of upper arm ([place]) 02/10/2012  . S/P laparoscopic fundoplication [id number]  . Fatigue 03/06/2011  . Low back pain radiating to right leg 02/06/2011  . Depression 05/22/2010  . FATTY LIVER DISEASE 12/07/2009  . COAGULOPATHY 07/12/2008  . CONSTIPATION 07/12/2008  . CHEST XRAY, ABNORMAL 01/04/2008  . Vitamin D deficiency 04/14/2007  . VITAMIN B12 DEFICIENCY 02/27/2007  . Essential hypertension 02/27/2007  . GERD 02/27/2007  . Fibromyalgia 02/27/2007      Lanney Gins, PT, DPT 04/17/16 8:59 AM     [facility] [address]  [place], [place], [address] Phone: [phone]   Fax:  [phone]  Name: [name] MRN: [id number] Date of Birth: 08-03-1945

## 2016-04-22 ENCOUNTER — Ambulatory Visit: Payer: Medicare HMO | Admitting: Physical Therapy

## 2016-04-22 DIAGNOSIS — M6281 Muscle weakness (generalized): Secondary | ICD-10-CM

## 2016-04-22 DIAGNOSIS — G8929 Other chronic pain: Secondary | ICD-10-CM

## 2016-04-22 DIAGNOSIS — M5441 Lumbago with sciatica, right side: Secondary | ICD-10-CM | POA: Diagnosis not present

## 2016-04-22 DIAGNOSIS — R293 Abnormal posture: Secondary | ICD-10-CM

## 2016-04-22 NOTE — Therapy (Signed)
[facility] [address]  [place], [place], [address] Phone: [phone]   Fax:  [phone]  Physical Therapy Treatment  Patient Details  Name: [name] MRN: [id number] Date of Birth: 1945-11-15 Referring Provider: Dr. Karlton Lemon  Encounter Date: 04/22/2016      PT End of Session - 04/22/16 0803    Visit Number 5   Number of Visits 12   Date for PT Re-Evaluation 05/16/16   PT Start Time 0759   PT Stop Time 0853   PT Time Calculation (min) 54 min   Activity Tolerance Patient tolerated treatment well;Patient limited by pain   Behavior During Therapy [facility] for tasks assessed/performed      Past Medical History:  Diagnosis Date  . Arthritis    "spine" (07/28/2014)  . Blood dyscrasia    bruises and bleed easily  . Breast cancer, left breast ([place]) 07/28/2014  . Breast cancer, right breast ([place]) 1995  . Chronic back pain    "all over"  . Complication of anesthesia    went to sleep easily but hard to wake up up until elbow OR in 2010  . Depression   . Dyspnea    Normal Spirometry 03/2008 EF 65% BNP normal 11/2007  . Family history of breast cancer   . Family history of colon cancer   . Family history of pancreatic cancer   . Gastric polyps   . GERD (gastroesophageal reflux disease)   . History of hiatal hernia   . Hx of adenomatous polyp of colon 07/03/2015  . Kidney stone    right kidney   . Lichen sclerosus   . Melanoma ([place]) 2010   "right elbow; treated at [facility]"  . Mild anxiety   . Vitamin B12 deficiency   . Vitamin D deficiency     Past Surgical History:  Procedure Laterality Date  . BREAST BIOPSY Left 04/2014  . BREAST RECONSTRUCTION WITH PLACEMENT OF TISSUE EXPANDER AND FLEX HD (ACELLULAR HYDRATED DERMIS) Left 07/28/2014   Procedure: LEFT BREAST RECONSTRUCTION PLACEMENT OF LEFT TISSUE EXPANDER ;  Surgeon: Crissie Reese, MD;  Location: [place];  Service: Plastics;  Laterality: Left;  . BREAST  RECONSTRUCTION WITH PLACEMENT OF TISSUE EXPANDER AND FLEX HD (ACELLULAR HYDRATED DERMIS) Left 09/08/2014   Procedure: REMOVAL OF TISSUE EXPANDER FROM LEFT BREAST;  Surgeon: Crissie Reese, MD;  Location: [place];  Service: Plastics;  Laterality: Left;  . BUNIONECTOMY Bilateral 1970's  . COLONOSCOPY     Dr Sharlett Iles  . CYSTOSCOPY WITH RETROGRADE PYELOGRAM, URETEROSCOPY AND STENT PLACEMENT Right 06/09/2015   Procedure: CYSTOSCOPY WITH RIGHT RETROGRADE PYELOGRAM, RIGHT URETEROSCOPY AND RIGHT URTERAL STENT PLACEMENT;  Surgeon: Ardis Hughs, MD;  [facility];  Service: Urology;  Laterality: Right;  . ESOPHAGOGASTRODUODENOSCOPY    . HERNIA REPAIR    . HOLMIUM LASER APPLICATION Right [id number]   Procedure: HOLMIUM LASER APPLICATION;  Surgeon: Ardis Hughs, MD;  [facility];  Service: Urology;  Laterality: Right;  . LATISSIMUS FLAP TO BREAST Left 09/08/2014   Procedure: LEFT LATISSIMUS FLAP TO BREAST WITH SALINE IMPLANT FOR BREAST RECONSTRUCTION;  Surgeon: Crissie Reese, MD;  Location: [place];  Service: Plastics;  Laterality: Left;  Marland Kitchen MASTECTOMY Right 1996    chemotherapy. pt. states 13 lymph nodes were removed  . MASTECTOMY COMPLETE / SIMPLE W/ SENTINEL NODE BIOPSY Left 07/28/2014  . MASTECTOMY W/ SENTINEL NODE BIOPSY Left 07/28/2014   Procedure: LEFT MASTECTOMY WITH SENTINEL LYMPH NODE MAPPING;  Surgeon: Autumn Messing III, MD;  Location: [place];  Service: General;  Laterality: Left;  Marland Kitchen MELANOMA EXCISION Right 2010   From elbow-- Done at [facility]   . NISSEN FUNDOPLICATION  [id number]  . RECONSTRUCTION BREAST IMMEDIATE / DELAYED W/ TISSUE EXPANDER Left 07/28/2014  . TEMPOROMANDIBULAR JOINT SURGERY Bilateral 1987  . TONSILLECTOMY      There were no vitals filed for this visit.      Subjective Assessment - 04/22/16 0800    Subjective Having a little sorreness today "uncomfortable - feels like somethign is around my waist pulling down - started yesterday"   Pertinent History arthritis, Breast Cancer  x 2, Melanoma (R elbow)   Limitations Sitting;Standing;Lifting;House hold activities   Diagnostic tests x ray: "2 slipped discs"   Patient Stated Goals improve flexibility, decrease pain, maintain active lifestyle   Currently in Pain? Yes   Pain Score 7    Pain Location Back   Pain Orientation Right  "right runs left"   Pain Descriptors / Indicators Aching;Numbness   Pain Type Chronic pain   Pain Onset More than a month ago   Pain Frequency Constant   Aggravating Factors  sitting, standing   Pain Relieving Factors repositioning, walking, flexion stretches                         [facility] Adult PT Treatment/Exercise - 04/22/16 0803      Lumbar Exercises: Stretches   Single Knee to Chest Stretch Limitations HEP review   Quadruped Mid Back Stretch Limitations seated edge of mat table, rolling out red physioball straight/right/left 5 x 10 second hold each     Lumbar Exercises: Aerobic   Stationary Bike L6 x 6 minutes     Lumbar Exercises: Supine   Ab Set 10 reps;5 seconds   AB Set Limitations progression to arms overhead in hooklying; progression to alternating LE marches   Clam 10 reps  2 sets   Clam Limitations with ab set; x 10 with green theraband   Bridge 10 reps;5 seconds   Bridge Limitations green tband at knees     Lumbar Exercises: Sidelying   Clam 10 reps  2 sets     Modalities   Modalities Traction     Traction   Type of Traction Lumbar   Min (lbs) 65   Max (lbs) 75   Hold Time 60   Rest Time 20   Time 15                     PT Long Term Goals - 04/08/16 0845      PT LONG TERM GOAL #1   Title verbalize understanding of posture/body mechanics to decrease risk of reinjury (05/16/16)   Status On-going     PT LONG TERM GOAL #2   Title independent with HEP (05/16/16)   Status On-going     PT LONG TERM GOAL #3   Title improve lumbar flexion to > 70 degrees for improved function and decreased pain (05/16/16)   Status On-going      PT LONG TERM GOAL #4   Title report centralization of symptoms for decreased pain and improved function (05/16/16)   Status On-going               Plan - 04/22/16 0804    Clinical Impression Statement Patient today with subjective reports of pain of 7/10 limiting progression of treatment/strengthening. PT session focusing on strengthening hip/core in non-irritating  positions as well as flexion based stretches with physioball. Traction continued as patient states she finds relief with this. Will progress hip and core strength at next session per patient tolerance.    PT Treatment/Interventions ADLs/Self Care Home Management;Cryotherapy;Electrical Stimulation;Moist Heat;Traction;Ultrasound;Iontophoresis 4mg /ml Dexamethasone;Therapeutic exercise;Therapeutic activities;Functional mobility training;Stair training;Gait training;Patient/family education;Manual techniques;Taping;Dry needling   PT Next Visit Plan continue hip/core strengthening, stretches PRN, traction   Consulted and Agree with Plan of Care Patient      Patient will benefit from skilled therapeutic intervention in order to improve the following deficits and impairments:  Decreased strength, Postural dysfunction, Impaired flexibility, Decreased range of motion, Decreased mobility, Pain  Visit Diagnosis: Chronic right-sided low back pain with right-sided sciatica  Abnormal posture  Muscle weakness (generalized)     Problem List Patient Active Problem List   Diagnosis Date Noted  . Strain of calf muscle 03/26/2016  . Hx of adenomatous polyp of colon 07/03/2015  . Visit for preventive health examination 06/13/2015  . Breast cancer ([place]) 09/08/2014  . 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 Genetic testing 06/29/2014  . Family history of breast cancer   . Family history of colon cancer   . Family history of pancreatic cancer   . Breast cancer, right breast ([place]) 05/18/2014  . Breast cancer of  upper-outer quadrant of left female breast ([place]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  . Pain in the chest 05/16/2014  . 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 Peripheral neuropathy ([place]) 01/01/2013  . Melanoma of upper arm ([place]) 02/10/2012  . S/P laparoscopic fundoplication [id number]  . Fatigue 03/06/2011  . Low back pain radiating to right leg 02/06/2011  . Depression 05/22/2010  . FATTY LIVER DISEASE 12/07/2009  . COAGULOPATHY 07/12/2008  . CONSTIPATION 07/12/2008  . CHEST XRAY, ABNORMAL 01/04/2008  . Vitamin D deficiency 04/14/2007  . VITAMIN B12 DEFICIENCY 02/27/2007  . Essential hypertension 02/27/2007  . GERD 02/27/2007  . Fibromyalgia 02/27/2007       Lanney Gins, PT, DPT 04/22/16 9:27 AM     [facility] [address]  [place], [place], [address] Phone: [phone]   Fax:  [phone]  Name: [name] MRN: [id number] Date of Birth: Sep 17, 1945

## 2016-04-25 ENCOUNTER — Ambulatory Visit: Payer: Medicare HMO

## 2016-04-25 DIAGNOSIS — G8929 Other chronic pain: Secondary | ICD-10-CM

## 2016-04-25 DIAGNOSIS — M5441 Lumbago with sciatica, right side: Principal | ICD-10-CM

## 2016-04-25 DIAGNOSIS — R293 Abnormal posture: Secondary | ICD-10-CM

## 2016-04-25 DIAGNOSIS — M6281 Muscle weakness (generalized): Secondary | ICD-10-CM

## 2016-04-25 NOTE — Therapy (Addendum)
[facility] [address]  [place], [place], [address] Phone: [phone]   Fax:  [phone]  Physical Therapy Treatment  Patient Details  Name: [name] MRN: [id number] Date of Birth: April 03, 1946 Referring Provider: Dr. Karlton Lemon  Encounter Date: 04/25/2016      PT End of Session - 04/25/16 0807    Visit Number 6   Number of Visits 12   Date for PT Re-Evaluation 05/16/16   PT Start Time 0800   PT Stop Time 0855   PT Time Calculation (min) 55 min   Activity Tolerance Patient tolerated treatment well;Patient limited by pain   Behavior During Therapy [facility] for tasks assessed/performed      Past Medical History:  Diagnosis Date  . Arthritis    "spine" (07/28/2014)  . Blood dyscrasia    bruises and bleed easily  . Breast cancer, left breast ([place]) 07/28/2014  . Breast cancer, right breast ([place]) 1995  . Chronic back pain    "all over"  . Complication of anesthesia    went to sleep easily but hard to wake up up until elbow OR in 2010  . Depression   . Dyspnea    Normal Spirometry 03/2008 EF 65% BNP normal 11/2007  . Family history of breast cancer   . Family history of colon cancer   . Family history of pancreatic cancer   . Gastric polyps   . GERD (gastroesophageal reflux disease)   . History of hiatal hernia   . Hx of adenomatous polyp of colon 07/03/2015  . Kidney stone    right kidney   . Lichen sclerosus   . Melanoma ([place]) 2010   "right elbow; [facility] at [facility]"  . Mild anxiety   . Vitamin B12 deficiency   . Vitamin D deficiency     Past Surgical History:  Procedure Laterality Date  . BREAST BIOPSY Left 04/2014  . BREAST RECONSTRUCTION WITH PLACEMENT OF TISSUE EXPANDER AND FLEX HD (ACELLULAR HYDRATED DERMIS) Left 07/28/2014   Procedure: LEFT BREAST RECONSTRUCTION PLACEMENT OF LEFT TISSUE EXPANDER ;  Surgeon: Crissie Reese, MD;  Location: [place];  Service: Plastics;  Laterality: Left;  . BREAST  RECONSTRUCTION WITH PLACEMENT OF TISSUE EXPANDER AND FLEX HD (ACELLULAR HYDRATED DERMIS) Left 09/08/2014   Procedure: REMOVAL OF TISSUE EXPANDER FROM LEFT BREAST;  Surgeon: Crissie Reese, MD;  Location: [place];  Service: Plastics;  Laterality: Left;  . BUNIONECTOMY Bilateral 1970's  . COLONOSCOPY     Dr Sharlett Iles  . CYSTOSCOPY WITH RETROGRADE PYELOGRAM, URETEROSCOPY AND STENT PLACEMENT Right 06/09/2015   Procedure: CYSTOSCOPY WITH RIGHT RETROGRADE PYELOGRAM, RIGHT URETEROSCOPY AND RIGHT URTERAL STENT PLACEMENT;  Surgeon: Ardis Hughs, MD;  [facility];  Service: Urology;  Laterality: Right;  . ESOPHAGOGASTRODUODENOSCOPY    . HERNIA REPAIR    . HOLMIUM LASER APPLICATION Right 10/31/3014   Procedure: HOLMIUM LASER APPLICATION;  Surgeon: Ardis Hughs, MD;  [facility];  Service: Urology;  Laterality: Right;  . LATISSIMUS FLAP TO BREAST Left 09/08/2014   Procedure: LEFT LATISSIMUS FLAP TO BREAST WITH SALINE IMPLANT FOR BREAST RECONSTRUCTION;  Surgeon: Crissie Reese, MD;  Location: [place];  Service: Plastics;  Laterality: Left;  Marland Kitchen MASTECTOMY Right 1996    chemotherapy. pt. states 13 lymph nodes were removed  . MASTECTOMY COMPLETE / SIMPLE W/ SENTINEL NODE BIOPSY Left 07/28/2014  . MASTECTOMY W/ SENTINEL NODE BIOPSY Left 07/28/2014   Procedure: LEFT MASTECTOMY WITH SENTINEL LYMPH NODE MAPPING;  Surgeon: Autumn Messing III, MD;  Location: [place];  Service: General;  Laterality: Left;  Marland Kitchen MELANOMA EXCISION Right 2010   From elbow-- [facility] at [facility]   . NISSEN FUNDOPLICATION  39/0300  . RECONSTRUCTION BREAST IMMEDIATE / DELAYED W/ TISSUE EXPANDER Left 07/28/2014  . TEMPOROMANDIBULAR JOINT SURGERY Bilateral 1987  . TONSILLECTOMY      There were no vitals filed for this visit.      Subjective Assessment - 04/25/16 0804    Subjective Pt. reporting having worse pain over the last few days with pt. reporting she thinks, "the inflammatory medication has worn off".  Pt. reporting increased R LE numbness  and, "heaviness" in RLE today.  Pt. reporting feeling a catching feeling in the R hip while walking yesterday x 2 reporting pain quickly subsisded.    Patient Stated Goals improve flexibility, decrease pain, maintain active lifestyle   Pain Score 8    Pain Location Back   Pain Orientation Right   Pain Descriptors / Indicators Aching;Numbness   Pain Type Chronic pain   Pain Radiating Towards R LE    Pain Onset More than a month ago   Pain Frequency Constant   Multiple Pain Sites No            [facility] Adult PT Treatment/Exercise - 04/25/16 0817      Lumbar Exercises: Stretches   Passive Hamstring Stretch 1 rep;30 seconds   Single Knee to Chest Stretch 3 reps;30 seconds   Piriformis Stretch 1 rep;30 seconds  with glute stretch x 30 sec      Lumbar Exercises: Aerobic   Stationary Bike L6 x 6 minutes     Lumbar Exercises: Standing   Other Standing Lumbar Exercises Standing alteranting hip extension, abduction with yellow TB around ankles x 10 reps each way     Lumbar Exercises: Supine   Clam 10 reps   Clam Limitations With green TB around knees and alternating    Bridge 15 reps;5 seconds   Other Supine Lumbar Exercises Sustained bridge with alternating hip abd/ER with green TB (2x) x 10 reps      Lumbar Exercises: Sidelying   Clam 10 reps;2 seconds   Clam Limitations x 12 reps    Hip Abduction --     Traction   Type of Traction Lumbar   Min (lbs) 70   Max (lbs) 80   Hold Time 60   Rest Time 20   Time 15            PT Long Term Goals - 04/08/16 0845      PT LONG TERM GOAL #1   Title verbalize understanding of posture/body mechanics to decrease risk of reinjury (05/16/16)   Status On-going     PT LONG TERM GOAL #2   Title independent with HEP (05/16/16)   Status On-going     PT LONG TERM GOAL #3   Title improve lumbar flexion to > 70 degrees for improved function and decreased pain (05/16/16)   Status On-going     PT LONG TERM GOAL #4   Title report  centralization of symptoms for decreased pain and improved function (05/16/16)   Status On-going               Plan - 04/25/16 0808    Clinical Impression Statement Pt. reporting she feels as if the original pain level she was having to start therapy is back today with significant report of R LE numbness and radicular symptoms.  Pt.  reporting she thinks mechanical lumbar traction has been helping thus traction progressed to 80#/70# today.  Pt. tolerated treatment today well with fall in pain level to 4/10 however reporting worsened pain following application of hooklying mechanical traction.  Pt. instructed to inform therapist next visit of prolonged response to traction.     PT Treatment/Interventions ADLs/Self Care Home Management;Cryotherapy;Electrical Stimulation;Moist Heat;Traction;Ultrasound;Iontophoresis 55m/ml Dexamethasone;Therapeutic exercise;Therapeutic activities;Functional mobility training;Stair training;Gait training;Patient/family education;Manual techniques;Taping;Dry needling   PT Next Visit Plan continue hip/core strengthening, stretches PRN, traction      Patient will benefit from skilled therapeutic intervention in order to improve the following deficits and impairments:  Decreased strength, Postural dysfunction, Impaired flexibility, Decreased range of motion, Decreased mobility, Pain  Visit Diagnosis: Chronic right-sided low back pain with right-sided sciatica  Abnormal posture  Muscle weakness (generalized)     G-Codes    Functional Assessment Tool Used Clinical judgement   Functional Limitation Mobility: Walking and moving around   Mobility: Walking and Moving Around Goal Status [phone] At least 40 percent but less than 60 percent impaired, limited or restricted   Mobility: Walking and Moving Around Discharge Status ([phone] At least 40 percent but less than 60 percent impaired, limited or restricted     Problem List Patient Active Problem List    Diagnosis Date Noted  . Strain of calf muscle 03/26/2016  . Hx of adenomatous polyp of colon 07/03/2015  . Visit for preventive health examination 06/13/2015  . Breast cancer ([place] 09/08/2014  . 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 Genetic testing 06/29/2014  . Family history of breast cancer   . Family history of colon cancer   . Family history of pancreatic cancer   . Breast cancer, right breast ([place] 05/18/2014  . Breast cancer of upper-outer quadrant of left female breast ([place] 05/16/2014  . Pain in the chest 05/16/2014  . 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 Peripheral neuropathy ([place] 01/01/2013  . Melanoma of upper arm ([place] 02/10/2012  . S/P laparoscopic fundoplication 089/21/1941 . Fatigue 03/06/2011  . Low back pain radiating to right leg 02/06/2011  . Depression 05/22/2010  . FATTY LIVER DISEASE 12/07/2009  . COAGULOPATHY 07/12/2008  . CONSTIPATION 07/12/2008  . CHEST XRAY, ABNORMAL 01/04/2008  . Vitamin D deficiency 04/14/2007  . VITAMIN B12 DEFICIENCY 02/27/2007  . Essential hypertension 02/27/2007  . GERD 02/27/2007  . Fibromyalgia 02/27/2007    MBess Harvest PTA 04/25/16 12:44 PM   PHYSICAL THERAPY DISCHARGE SUMMARY  Visits from Start of Care: 6  Current functional level related to goals / functional outcomes: See above   Remaining deficits: See above; pain, reduced tolerance to activity   Education / Equipment: HEP  Plan: Patient agrees to discharge.  Patient goals were not met. Patient is being discharged due to lack of progress.  ?????    Patient reporting an increase/exacerbation in back pain over last few treatment visits. Chart review completed with patient returning to MD for follow-up. MD ordering MRI and possible consult to neurosurgery depending on outcome.  SLanney Gins PT, DPT 06/14/16 10:44 AM   [facility] [place]  [address]Phone: [phone]  Fax:  [phone] Name: [name] HICKLINGMRN: [id number] of Birth: [birth year]

## 2016-04-29 ENCOUNTER — Ambulatory Visit: Payer: Medicare HMO | Admitting: Physical Therapy

## 2016-05-02 ENCOUNTER — Ambulatory Visit (INDEPENDENT_AMBULATORY_CARE_PROVIDER_SITE_OTHER): Payer: Medicare HMO | Admitting: Family Medicine

## 2016-05-02 ENCOUNTER — Ambulatory Visit: Payer: Medicare HMO

## 2016-05-02 ENCOUNTER — Encounter: Payer: Self-pay | Admitting: Family Medicine

## 2016-05-02 DIAGNOSIS — M545 Low back pain, unspecified: Secondary | ICD-10-CM

## 2016-05-02 NOTE — Progress Notes (Signed)
PCP: Leeanne Rio, PA-C  Subjective:   HPI: Patient is a 70 y.o. female here for right hip, back pain.  10/31: Patient denies known injury. She reports having pain in posterior right hip, low back area for at least 4 years. Pain radiates from this area down to the ankle. Describes pain as burning, 9/10 level of pain. She takes aleve. Associated numbness in right side down to knee No bowel/bladder dysfunction. History of melanoma and had breast cancer twice. No skin changes. She also asked me to take a look at her left medial calf - had coffee table fall on this a month ago.  12/7: Patient reports she has not improved since last visit. Has done physical therapy, home exercises, taking aleve. Pain still 9/10 level, sharp. Feels like right leg is on fire down to level of the ankle. Now with some pain onto the left side too and into leg to about the thigh level. Associated numbness same distributions. No bowel/bladder dysfunction.  Past Medical History:  Diagnosis Date  . Arthritis    "spine" (07/28/2014)  . Blood dyscrasia    bruises and bleed easily  . Breast cancer, left breast ([place]) 07/28/2014  . Breast cancer, right breast ([place]) 1995  . Chronic back pain    "all over"  . Complication of anesthesia    went to sleep easily but hard to wake up up until elbow OR in 2010  . Depression   . Dyspnea    Normal Spirometry 03/2008 EF 65% BNP normal 11/2007  . Family history of breast cancer   . Family history of colon cancer   . Family history of pancreatic cancer   . Gastric polyps   . GERD (gastroesophageal reflux disease)   . History of hiatal hernia   . Hx of adenomatous polyp of colon 07/03/2015  . Kidney stone    right kidney   . Lichen sclerosus   . Melanoma ([place]) 2010   "right elbow; treated at [facility]"  . Mild anxiety   . Vitamin B12 deficiency   . Vitamin D deficiency     Current Outpatient Prescriptions on File Prior to Visit  Medication Sig Dispense Refill   . anastrozole (ARIMIDEX) 1 MG tablet Take 1 tablet (1 mg total) by mouth daily. 90 tablet 4  . Cholecalciferol (VITAMIN D3) 1000 UNITS CAPS Take 1 capsule by mouth daily.      . citalopram (CELEXA) 10 MG tablet TAKE 1 TABLET (10 MG TOTAL) BY MOUTH DAILY. 30 tablet 2  . Cyanocobalamin (VITAMIN B-12 CR) 1000 MCG TBCR Take 1 tablet by mouth daily.      . diclofenac (VOLTAREN) 75 MG EC tablet Take 1 tablet (75 mg total) by mouth 2 (two) times daily. 30 tablet 0  . furosemide (LASIX) 40 MG tablet TAKE 1 TABLET (40 MG TOTAL) BY MOUTH DAILY. 90 tablet 0  . naproxen sodium (ANAPROX) 220 MG tablet Take 220 mg by mouth every morning.    Marland Kitchen OVER THE COUNTER MEDICATION Probiotic 90 billion-Take 1 tablet by mouth daily.    . [DISCONTINUED] sucralfate (CARAFATE) 1 GM/10ML suspension Take 1 g by mouth at bedtime as needed.       No current facility-administered medications on file prior to visit.     Past Surgical History:  Procedure Laterality Date  . BREAST BIOPSY Left 04/2014  . BREAST RECONSTRUCTION WITH PLACEMENT OF TISSUE EXPANDER AND FLEX HD (ACELLULAR HYDRATED DERMIS) Left 07/28/2014   Procedure: LEFT BREAST RECONSTRUCTION PLACEMENT OF  LEFT TISSUE EXPANDER ;  Surgeon: Crissie Reese, MD;  Location: [place];  Service: Plastics;  Laterality: Left;  . BREAST RECONSTRUCTION WITH PLACEMENT OF TISSUE EXPANDER AND FLEX HD (ACELLULAR HYDRATED DERMIS) Left 09/08/2014   Procedure: REMOVAL OF TISSUE EXPANDER FROM LEFT BREAST;  Surgeon: Crissie Reese, MD;  Location: [place];  Service: Plastics;  Laterality: Left;  . BUNIONECTOMY Bilateral 1970's  . COLONOSCOPY     Dr Sharlett Iles  . CYSTOSCOPY WITH RETROGRADE PYELOGRAM, URETEROSCOPY AND STENT PLACEMENT Right 06/09/2015   Procedure: CYSTOSCOPY WITH RIGHT RETROGRADE PYELOGRAM, RIGHT URETEROSCOPY AND RIGHT URTERAL STENT PLACEMENT;  Surgeon: Ardis Hughs, MD;  Location: [facility];  Service: Urology;  Laterality: Right;  . ESOPHAGOGASTRODUODENOSCOPY    . HERNIA REPAIR    .  HOLMIUM LASER APPLICATION Right [id number]   Procedure: HOLMIUM LASER APPLICATION;  Surgeon: Ardis Hughs, MD;  Location: [facility];  Service: Urology;  Laterality: Right;  . LATISSIMUS FLAP TO BREAST Left 09/08/2014   Procedure: LEFT LATISSIMUS FLAP TO BREAST WITH SALINE IMPLANT FOR BREAST RECONSTRUCTION;  Surgeon: Crissie Reese, MD;  Location: [place];  Service: Plastics;  Laterality: Left;  Marland Kitchen MASTECTOMY Right 1996    chemotherapy. pt. states 13 lymph nodes were removed  . MASTECTOMY COMPLETE / SIMPLE W/ SENTINEL NODE BIOPSY Left 07/28/2014  . MASTECTOMY W/ SENTINEL NODE BIOPSY Left 07/28/2014   Procedure: LEFT MASTECTOMY WITH SENTINEL LYMPH NODE MAPPING;  Surgeon: Autumn Messing III, MD;  Location: [place];  Service: General;  Laterality: Left;  Marland Kitchen MELANOMA EXCISION Right 2010   From elbow-- [facility]   . NISSEN FUNDOPLICATION  [id number]  . RECONSTRUCTION BREAST IMMEDIATE / DELAYED W/ TISSUE EXPANDER Left 07/28/2014  . TEMPOROMANDIBULAR JOINT SURGERY Bilateral 1987  . TONSILLECTOMY      Allergies  Allergen Reactions  . Silodosin Rash    Facial rash  . Ace Inhibitors Other (See Comments)    REACTION: angioedema  . Buprenorphine Hcl Other (See Comments)    "crazy"  . Morphine And Related Other (See Comments)    "crazy"  . Codeine Rash  . Sulfonamide Derivatives Rash  . Telmisartan Rash    Social History   Social History  . Marital status: Married    Spouse name: N/A  . Number of children: 2  . Years of education: N/A   Occupational History  . Manager    Social History Main Topics  . Smoking status: Never Smoker  . Smokeless tobacco: Never Used     Comment: Regular Exercise - Yes  . Alcohol use No  . Drug use: No  . Sexual activity: Yes   Other Topics Concern  . Not on file   Social History Narrative  . No narrative on file    Family History  Problem Relation Age of Onset  . Stroke Other     F 1st degree relative 86, M 1st degree relative  . Colon cancer Cousin 69     double first cousin  . Colon cancer Cousin 28    double first cousin  . Colon polyps Father     between 53-20  . Dementia Father   . Pancreatic cancer Brother 72  . Colon polyps Brother     between 10-20  . Cancer Paternal Uncle     NOS  . Anemia Paternal Grandfather     pernicious anemia  . Breast cancer Cousin 83    double first cousin  . Breast cancer Cousin 74    paternal cousin  BP 138/88   Pulse 80   Ht 5\' 8"  (1.727 m)   Wt 181 lb (82.1 kg)   BMI 27.52 kg/m   Review of Systems: See HPI above.    Objective:  Physical Exam:  Gen: NAD, comfortable in exam room  Back/right hip: No gross deformity, scoliosis. TTP right > left lumbar paraspinal areas.  No midline or bony TTP. ROM limited with flexion and extension due to pain. Strength 5-/5 bilateral hip flexion.  5/5 all other motions lower extremities. 2+ MSRs in patellar and achilles tendons, equal bilaterally. Positive SLR on right, negative left. Decreased sensation right lower extremity. Negative logroll bilateral hips   Assessment & Plan:  1. Right low back pain - with radiation into both legs now, right worse than left.  Not improved with nsaids, physical therapy, home exercises.  Will go ahead with MRI to further assess, consider epidural steroid injections or neurosurgery referral depending on results.

## 2016-05-02 NOTE — Assessment & Plan Note (Signed)
with radiation into both legs now, right worse than left.  Not improved with nsaids, physical therapy, home exercises.  Will go ahead with MRI to further assess, consider epidural steroid injections or neurosurgery referral depending on results.

## 2016-05-10 DIAGNOSIS — H16213 Exposure keratoconjunctivitis, bilateral: Secondary | ICD-10-CM | POA: Diagnosis not present

## 2016-05-15 ENCOUNTER — Other Ambulatory Visit: Payer: Self-pay | Admitting: Physician Assistant

## 2016-05-15 MED FILL — FUROSEMIDE 40 MG TABLET: 40 | 90 days supply | Qty: 90 | Fill #0

## 2016-05-15 MED FILL — CITALOPRAM HBR 10 MG TABLET: 10 | 30 days supply | Qty: 30 | Fill #2

## 2016-05-31 IMAGING — CR DG CHEST 1V PORT
1 series · 1 of 1 positions shown · non-contrast
Comparison: 07/20/2014

CLINICAL DATA: Nausea.  Vomiting.  Fever.

EXAM:
PORTABLE CHEST - 1 VIEW

[view not recorded]
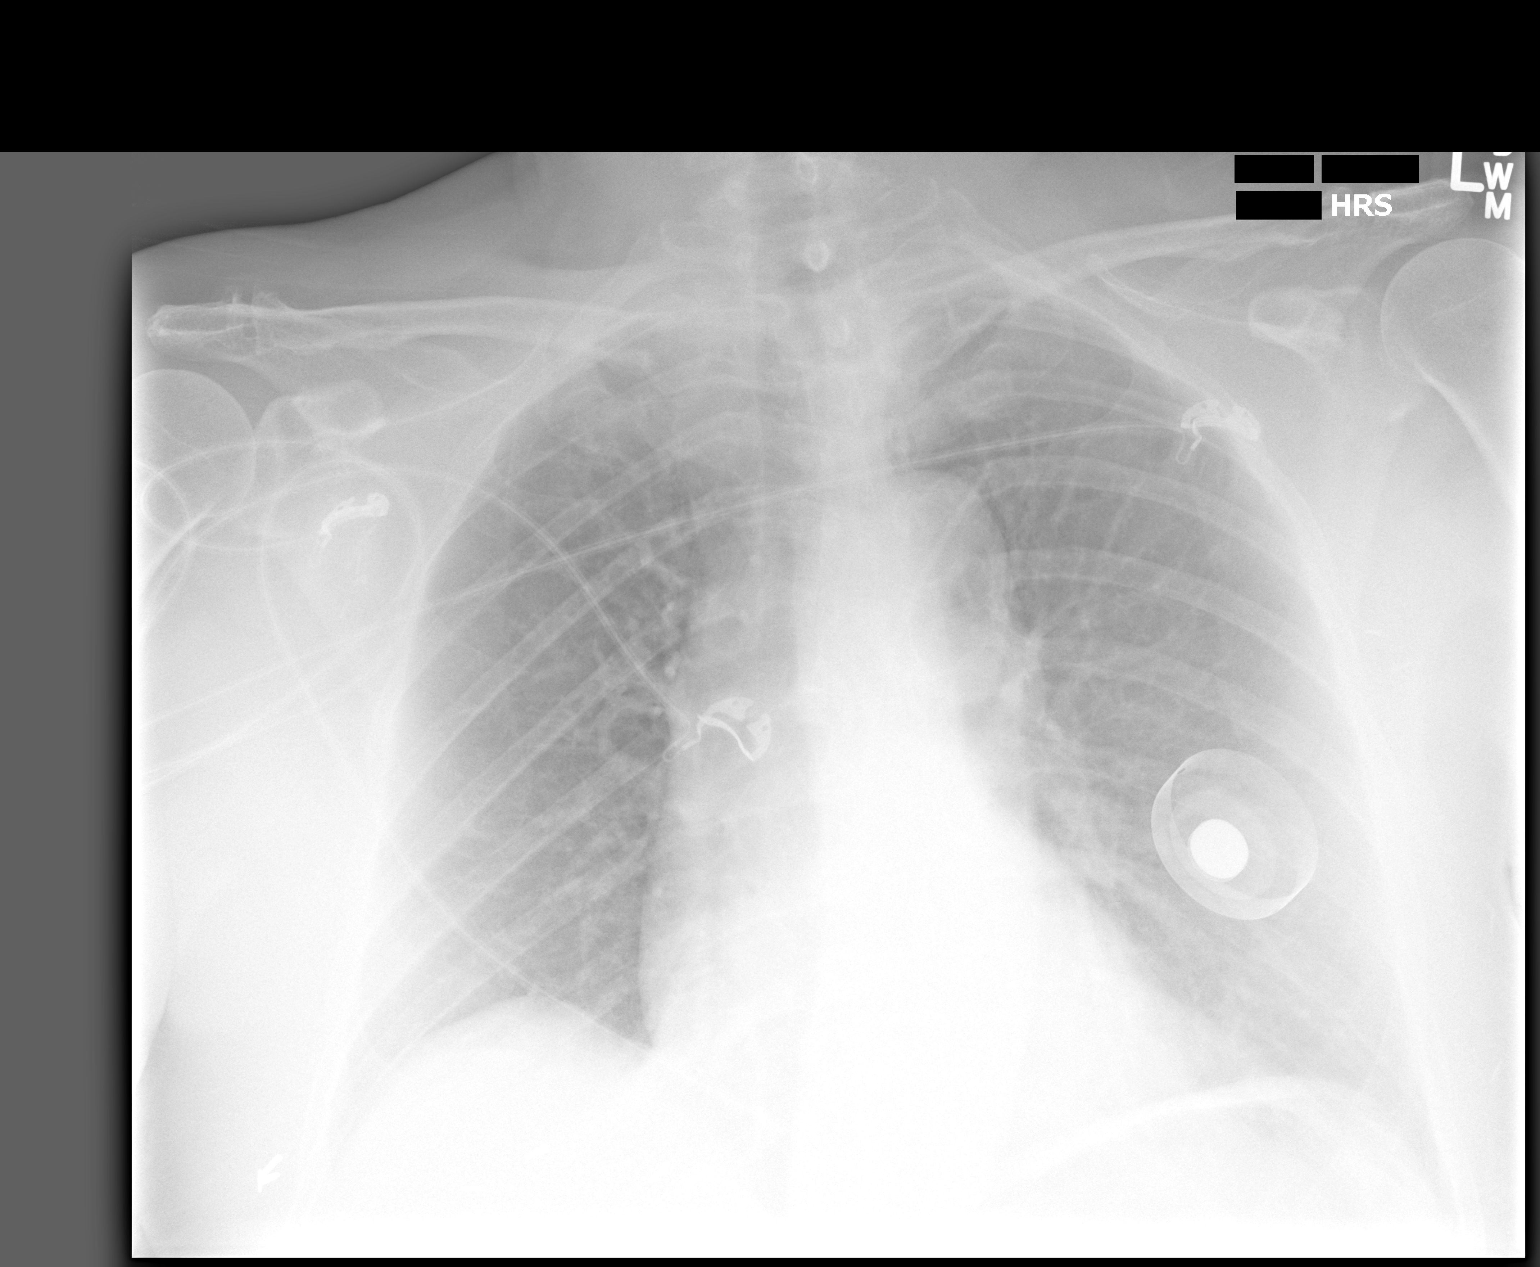

[1 of 1 positions shown; findings below may reference images not displayed]

FINDINGS: Surgical clips which project over the lower right chest and upper
abdomen. Soft tissue expander projects over the left chest wall.
Surgical clips about the left axilla and lateral chest wall.

Midline trachea. Normal heart size. No pleural effusion or
pneumothorax. Clear lungs.
IMPRESSION: No acute cardiopulmonary disease.

## 2016-06-04 ENCOUNTER — Ambulatory Visit: Payer: Medicare HMO | Admitting: Physician Assistant

## 2016-06-06 ENCOUNTER — Encounter: Payer: Self-pay | Admitting: Physician Assistant

## 2016-06-06 ENCOUNTER — Ambulatory Visit (INDEPENDENT_AMBULATORY_CARE_PROVIDER_SITE_OTHER): Payer: Medicare HMO | Admitting: Physician Assistant

## 2016-06-06 VITALS — BP 130/80 | HR 71 | Temp 98.4°F | Resp 16 | Ht 67.0 in | Wt 190.0 lb

## 2016-06-06 DIAGNOSIS — G629 Polyneuropathy, unspecified: Secondary | ICD-10-CM

## 2016-06-06 DIAGNOSIS — J0100 Acute maxillary sinusitis, unspecified: Secondary | ICD-10-CM

## 2016-06-06 MED ORDER — GABAPENTIN 100 MG PO CAPS: 100.0000 mg | ORAL_CAPSULE | Freq: Every day | ORAL | 3 refills | Status: DC

## 2016-06-06 MED ORDER — DOXYCYCLINE HYCLATE 100 MG PO CAPS: 100.0000 mg | ORAL_CAPSULE | Freq: Two times a day (BID) | ORAL | 0 refills | Status: DC

## 2016-06-06 MED ORDER — BENZONATATE 200 MG PO CAPS: 200.0000 mg | ORAL_CAPSULE | Freq: Two times a day (BID) | ORAL | 0 refills | Status: DC | PRN

## 2016-06-06 NOTE — Progress Notes (Signed)
Patient presents to clinic today c/o 8 weeks of sinus pressure, sinus pain and nasal congestion. Was seen at [facility] several weeks prior and was started on Augmentin. Noted initial improvement in symptoms but that they worsened since that time. Endorses continued sinus pressure/pain, ear pain and R sided facial pain. Is also noting significant nasal congestion. Endorses fatigue and cough with some chest tightness. Cough is worse at night with laying down.   Past Medical History:  Diagnosis Date  . Arthritis    "spine" (07/28/2014)  . Blood dyscrasia    bruises and bleed easily  . Breast cancer, left breast ([place]) 07/28/2014  . Breast cancer, right breast ([place]) 1995  . Chronic back pain    "all over"  . Complication of anesthesia    went to sleep easily but hard to wake up up until elbow OR in 2010  . Depression   . Dyspnea    Normal Spirometry 03/2008 EF 65% BNP normal 11/2007  . Family history of breast cancer   . Family history of colon cancer   . Family history of pancreatic cancer   . Gastric polyps   . GERD (gastroesophageal reflux disease)   . History of hiatal hernia   . Hx of adenomatous polyp of colon 07/03/2015  . Kidney stone    right kidney   . Lichen sclerosus   . Melanoma ([place]) 2010   "right elbow; [facility]"  . Mild anxiety   . Vitamin B12 deficiency   . Vitamin D deficiency     Current Outpatient Prescriptions on File Prior to Visit  Medication Sig Dispense Refill  . anastrozole (ARIMIDEX) 1 MG tablet Take 1 tablet (1 mg total) by mouth daily. 90 tablet 4  . Cholecalciferol (VITAMIN D3) 1000 UNITS CAPS Take 1 capsule by mouth daily.      . citalopram (CELEXA) 10 MG tablet TAKE 1 TABLET (10 MG TOTAL) BY MOUTH DAILY. 30 tablet 2  . Cyanocobalamin (VITAMIN B-12 CR) 1000 MCG TBCR Take 1 tablet by mouth daily.      . furosemide (LASIX) 40 MG tablet TAKE 1 TABLET (40 MG TOTAL) BY MOUTH DAILY. 90 tablet 0  . OVER THE COUNTER MEDICATION Probiotic 90 billion-Take 1 tablet  by mouth daily.    . [DISCONTINUED] sucralfate (CARAFATE) 1 GM/10ML suspension Take 1 g by mouth at bedtime as needed.       No current facility-administered medications on file prior to visit.     Allergies  Allergen Reactions  . Silodosin Rash    Facial rash  . Ace Inhibitors Other (See Comments)    REACTION: angioedema  . Buprenorphine Hcl Other (See Comments)    "crazy"  . Morphine And Related Other (See Comments)    "crazy"  . Codeine Rash  . Sulfonamide Derivatives Rash  . Telmisartan Rash    Family History  Problem Relation Age of Onset  . Stroke Other     F 1st degree relative 87, M 1st degree relative  . Colon cancer Cousin 44    double first cousin  . Colon cancer Cousin 69    double first cousin  . Colon polyps Father     between 51-20  . Dementia Father   . Pancreatic cancer Brother 58  . Colon polyps Brother     between 10-20  . Cancer Paternal Uncle     NOS  . Anemia Paternal Grandfather     pernicious anemia  . Breast cancer Cousin 27  double first cousin  . Breast cancer Cousin 74    paternal cousin    Social History   Social History  . Marital status: Married    Spouse name: N/A  . Number of children: 2  . Years of education: N/A   Occupational History  . Manager    Social History Main Topics  . Smoking status: Never Smoker  . Smokeless tobacco: Never Used     Comment: Regular Exercise - Yes  . Alcohol use No  . Drug use: No  . Sexual activity: Yes   Other Topics Concern  . None   Social History Narrative  . None   Review of Systems - See HPI.  All other ROS are negative.  BP 130/80   Pulse 71   Temp 98.4 F (36.9 C) (Oral)   Resp 16   Ht 5\' 7"  (1.702 m)   Wt 190 lb (86.2 kg)   SpO2 96%   BMI 29.76 kg/m   Physical Exam  Constitutional: She is oriented to person, place, and time and well-developed, well-nourished, and in no distress.  HENT:  Head: Normocephalic and atraumatic.  Right Ear: Tympanic membrane and  external ear normal.  Left Ear: Tympanic membrane and external ear normal.  Nose: Mucosal edema and rhinorrhea present.  Mouth/Throat: Uvula is midline, oropharynx is clear and moist and mucous membranes are normal.  + TTP of sinuses noted on examination  Eyes: Conjunctivae are normal.  Neck: Neck supple.  Cardiovascular: Normal rate, regular rhythm, normal heart sounds and intact distal pulses.   Pulmonary/Chest: Effort normal and breath sounds normal. No respiratory distress. She has no wheezes. She has no rales. She exhibits no tenderness.  Lymphadenopathy:    She has no cervical adenopathy.  Neurological: She is alert and oriented to person, place, and time.  Skin: Skin is warm and dry. No rash noted.  Psychiatric: Affect normal.  Vitals reviewed.  Assessment/Plan: 1. Acute maxillary sinusitis, recurrence not specified Rx Doxycycline. Tessalon per orders. Supportive measures and OTC medications reviewed. FU if not improving. - doxycycline (VIBRAMYCIN) 100 MG capsule; Take 1 capsule (100 mg total) by mouth 2 (two) times daily.  Dispense: 20 capsule; Refill: 0 - benzonatate (TESSALON) 200 MG capsule; Take 1 capsule (200 mg total) by mouth 2 (two) times daily as needed for cough.  Dispense: 20 capsule; Refill: 0  2. Neuropathy ([place]) Noted at end of visit. Patient with burning of feet at night. No history of hyperglycemia or diabetes. Will start Neurontin 100 mg each evening. FU discussed. Will obtain further assessment at her FU visit that has been scheduled.  - gabapentin (NEURONTIN) 100 MG capsule; Take 1 capsule (100 mg total) by mouth at bedtime.  Dispense: 30 capsule; Refill: 3   Leeanne Rio, [place]

## 2016-06-06 NOTE — Patient Instructions (Addendum)
Please take antibiotic as directed.  Increase fluid intake.  Use Saline nasal spray.  Take a daily multivitamin. Start the [place] as directed for cough.  Place a humidifier in the bedroom.  Please call or return clinic if symptoms are not improving.  For neuropathy, start the Gabapentin in the evening. Follow-up with me in 3-4 weeks for reassessment. We will be due for your physical at that time.   Sinusitis Sinusitis is redness, soreness, and swelling (inflammation) of the paranasal sinuses. Paranasal sinuses are air pockets within the bones of your face (beneath the eyes, the middle of the forehead, or above the eyes). In healthy paranasal sinuses, mucus is able to drain out, and air is able to circulate through them by way of your nose. However, when your paranasal sinuses are inflamed, mucus and air can become trapped. This can allow bacteria and other germs to grow and cause infection. Sinusitis can develop quickly and last only a short time (acute) or continue over a long period (chronic). Sinusitis that lasts for more than 12 weeks is considered chronic.  CAUSES  Causes of sinusitis include:  Allergies.  Structural abnormalities, such as displacement of the cartilage that separates your nostrils (deviated septum), which can decrease the air flow through your nose and sinuses and affect sinus drainage.  Functional abnormalities, such as when the small hairs (cilia) that line your sinuses and help remove mucus do not work properly or are not present. SYMPTOMS  Symptoms of acute and chronic sinusitis are the same. The primary symptoms are pain and pressure around the affected sinuses. Other symptoms include:  Upper toothache.  Earache.  Headache.  Bad breath.  Decreased sense of smell and taste.  A cough, which worsens when you are lying flat.  Fatigue.  Fever.  Thick drainage from your nose, which often is green and may contain pus (purulent).  Swelling and warmth over the  affected sinuses. DIAGNOSIS  Your caregiver will perform a physical exam. During the exam, your caregiver may:  Look in your nose for signs of abnormal growths in your nostrils (nasal polyps).  Tap over the affected sinus to check for signs of infection.  View the inside of your sinuses (endoscopy) with a special imaging device with a light attached (endoscope), which is inserted into your sinuses. If your caregiver suspects that you have chronic sinusitis, one or more of the following tests may be recommended:  Allergy tests.  Nasal culture A sample of mucus is taken from your nose and sent to a lab and screened for bacteria.  Nasal cytology A sample of mucus is taken from your nose and examined by your caregiver to determine if your sinusitis is related to an allergy. TREATMENT  Most cases of acute sinusitis are related to a viral infection and will resolve on their own within 10 days. Sometimes medicines are prescribed to help relieve symptoms (pain medicine, decongestants, nasal steroid sprays, or saline sprays).  However, for sinusitis related to a bacterial infection, your caregiver will prescribe antibiotic medicines. These are medicines that will help kill the bacteria causing the infection.  Rarely, sinusitis is caused by a fungal infection. In theses cases, your caregiver will prescribe antifungal medicine. For some cases of chronic sinusitis, surgery is needed. Generally, these are cases in which sinusitis recurs more than 3 times per year, despite other treatments. HOME CARE INSTRUCTIONS   Drink plenty of water. Water helps thin the mucus so your sinuses can drain more easily.  Use a  humidifier.  Inhale steam 3 to 4 times a day (for example, sit in the bathroom with the shower running).  Apply a warm, moist washcloth to your face 3 to 4 times a day, or as directed by your caregiver.  Use saline nasal sprays to help moisten and clean your sinuses.  Take over-the-counter or  prescription medicines for pain, discomfort, or fever only as directed by your caregiver. SEEK IMMEDIATE MEDICAL CARE IF:  You have increasing pain or severe headaches.  You have nausea, vomiting, or drowsiness.  You have swelling around your face.  You have vision problems.  You have a stiff neck.  You have difficulty breathing. MAKE SURE YOU:   Understand these instructions.  Will watch your condition.  Will get help right away if you are not doing well or get worse. Document Released: 05/13/2005 Document Revised: 08/05/2011 Document Reviewed: 05/28/2011 [facility] Patient Information 2014 [place], [place].

## 2016-06-06 NOTE — Progress Notes (Signed)
Pre visit review using our clinic review tool, if applicable. No additional management support is needed unless otherwise documented below in the visit note. 

## 2016-06-17 ENCOUNTER — Ambulatory Visit (HOSPITAL_BASED_OUTPATIENT_CLINIC_OR_DEPARTMENT_OTHER)
Admission: RE | Admit: 2016-06-17 | Discharge: 2016-06-17 | Disposition: A | Discharge status: Home / Self Care | Payer: Medicare HMO | Source: Ambulatory Visit | Attending: Physician Assistant | Admitting: Physician Assistant

## 2016-06-17 ENCOUNTER — Telehealth: Payer: Self-pay | Admitting: Physician Assistant

## 2016-06-17 DIAGNOSIS — R062 Wheezing: Secondary | ICD-10-CM | POA: Diagnosis not present

## 2016-06-17 DIAGNOSIS — R05 Cough: Secondary | ICD-10-CM

## 2016-06-17 DIAGNOSIS — R058 Other specified cough: Secondary | ICD-10-CM

## 2016-06-17 DIAGNOSIS — J40 Bronchitis, not specified as acute or chronic: Secondary | ICD-10-CM | POA: Diagnosis not present

## 2016-06-17 MED ORDER — ALBUTEROL SULFATE HFA 108 (90 BASE) MCG/ACT IN AERS: 2.0000 | INHALATION_SPRAY | Freq: Four times a day (QID) | RESPIRATORY_TRACT | 0 refills | Status: DC | PRN

## 2016-06-17 NOTE — Telephone Encounter (Signed)
Advised patient to stay well hydrated, Albuterol inhaler sent to the pharmacy to help with the wheezing, CXR ordered can go get at her earliest conveince. She is agreeable. Will notify of results.

## 2016-06-17 NOTE — Telephone Encounter (Signed)
Have her stay well hydrated. I have sent in an albuterol inhaler to help with wheezing. CXR has been ordered at [facility]. She can go get at earliest convenience. Will alter her regimen based on results.  If any significant symptoms she needs to be re-evaluated in office.

## 2016-06-17 NOTE — Telephone Encounter (Signed)
Patient calling to report she was seen in office last week for sinus infection.  She states she has completed all medications that were prescribed to her but she still does not feel well.  Complaining of wheezing in chest and uncontrollable cough.  Patient reports she was instructed to call back if not better and pcp would order a chest x-ray at [facility].

## 2016-06-19 ENCOUNTER — Other Ambulatory Visit: Payer: Self-pay | Admitting: Emergency Medicine

## 2016-06-19 ENCOUNTER — Telehealth: Payer: Self-pay | Admitting: Physician Assistant

## 2016-06-19 DIAGNOSIS — F339 Major depressive disorder, recurrent, unspecified: Secondary | ICD-10-CM

## 2016-06-19 MED ORDER — CITALOPRAM HYDROBROMIDE 10 MG PO TABS: ORAL_TABLET | ORAL | 5 refills | Status: DC

## 2016-06-19 NOTE — Telephone Encounter (Signed)
rx sent to the new rx to [facility] [address]

## 2016-06-19 NOTE — Telephone Encounter (Signed)
Pt is needing a refill on citalopram, pt states that she has changed pharmacies to [facility] [address], pt states that she does have an upcoming appt with Einar Phe[facility], but does not have any refills left on this Rx

## 2016-06-27 ENCOUNTER — Ambulatory Visit (INDEPENDENT_AMBULATORY_CARE_PROVIDER_SITE_OTHER): Payer: Medicare HMO | Admitting: Physician Assistant

## 2016-06-27 ENCOUNTER — Encounter: Payer: Self-pay | Admitting: Physician Assistant

## 2016-06-27 VITALS — BP 130/84 | HR 68 | Temp 98.3°F | Resp 14 | Ht 67.0 in | Wt 190.0 lb

## 2016-06-27 DIAGNOSIS — G6289 Other specified polyneuropathies: Secondary | ICD-10-CM | POA: Diagnosis not present

## 2016-06-27 DIAGNOSIS — Z0000 Encounter for general adult medical examination without abnormal findings: Secondary | ICD-10-CM | POA: Diagnosis not present

## 2016-06-27 DIAGNOSIS — I10 Essential (primary) hypertension: Secondary | ICD-10-CM | POA: Diagnosis not present

## 2016-06-27 DIAGNOSIS — R69 Illness, unspecified: Secondary | ICD-10-CM | POA: Diagnosis not present

## 2016-06-27 DIAGNOSIS — F339 Major depressive disorder, recurrent, unspecified: Secondary | ICD-10-CM

## 2016-06-27 LAB — URINALYSIS, ROUTINE W REFLEX MICROSCOPIC
Bilirubin Urine: NEGATIVE
HGB URINE DIPSTICK: NEGATIVE
KETONES UR: NEGATIVE
LEUKOCYTES UA: NEGATIVE
Nitrite: NEGATIVE
Specific Gravity, Urine: 1.015 (ref 1.000–1.030)
TOTAL PROTEIN, URINE-UPE24: NEGATIVE
UROBILINOGEN UA: 0.2 (ref 0.0–1.0)
Urine Glucose: NEGATIVE
pH: 7.5 (ref 5.0–8.0)

## 2016-06-27 LAB — LIPID PANEL
CHOL/HDL RATIO: 4
Cholesterol: 216 mg/dL — ABNORMAL HIGH (ref 0–200)
HDL: 49.8 mg/dL (ref >39.00)
LDL Cholesterol: 149 mg/dL — ABNORMAL HIGH (ref 0–99)
NonHDL: 165.89
TRIGLYCERIDES: 83 mg/dL (ref 0.0–149.0)
VLDL: 16.6 mg/dL (ref 0.0–40.0)

## 2016-06-27 LAB — COMPREHENSIVE METABOLIC PANEL
ALBUMIN: 4.2 g/dL (ref 3.5–5.2)
ALK PHOS: 56 U/L (ref 39–117)
ALT: 20 U/L (ref 0–35)
AST: 20 U/L (ref 0–37)
BILIRUBIN TOTAL: 0.5 mg/dL (ref 0.2–1.2)
BUN: 20 mg/dL (ref 6–23)
CALCIUM: 9.4 mg/dL (ref 8.4–10.5)
CO2: 33 mEq/L — ABNORMAL HIGH (ref 19–32)
CREATININE: 0.85 mg/dL (ref 0.40–1.20)
Chloride: 106 mEq/L (ref 96–112)
GFR: 70.19 mL/min (ref >60.00)
Glucose, Bld: 87 mg/dL (ref 70–99)
Potassium: 4.5 mEq/L (ref 3.5–5.1)
Sodium: 143 mEq/L (ref 135–145)
TOTAL PROTEIN: 6.6 g/dL (ref 6.0–8.3)

## 2016-06-27 LAB — TSH: TSH: 1.71 u[IU]/mL (ref 0.35–4.50)

## 2016-06-27 MED ORDER — LEVOCETIRIZINE DIHYDROCHLORIDE 5 MG PO TABS: 5.0000 mg | ORAL_TABLET | Freq: Every evening | ORAL | 1 refills | Status: DC

## 2016-06-27 NOTE — Progress Notes (Signed)
Subjective:   [name] is a 71 y.o. female who presents for Medicare Annual (Subsequent) preventive examination.  Review of Systems:  Review of Systems  Constitutional: Negative for fever and weight loss.  HENT: Negative for ear discharge, ear pain, hearing loss and tinnitus.   Eyes: Negative for blurred vision, double vision, photophobia and pain.  Respiratory: Positive for cough. Negative for shortness of breath.   Cardiovascular: Negative for chest pain and palpitations.  Gastrointestinal: Negative for abdominal pain, blood in stool, constipation, diarrhea, heartburn, melena, nausea and vomiting.  Genitourinary: Negative for dysuria, flank pain, frequency, hematuria and urgency.  Musculoskeletal: Negative for falls.  Neurological: Negative for dizziness, loss of consciousness and headaches.  Endo/Heme/Allergies: Negative for environmental allergies.  Psychiatric/Behavioral: Negative for depression, hallucinations, substance abuse and suicidal ideas. The patient is not nervous/anxious and does not have insomnia.       Objective:     Vitals: BP 130/84   Pulse 68   Temp 98.3 F (36.8 C) (Oral)   Resp 14   Ht 5\' 7"  (1.702 m)   Wt 190 lb (86.2 kg)   SpO2 99%   BMI 29.76 kg/m   Body mass index is 29.76 kg/m.   Tobacco History  Smoking Status  . Never Smoker  Smokeless Tobacco  . Never Used    Comment: Regular Exercise - Yes     Counseling given: Not Answered   Past Medical History:  Diagnosis Date  . Arthritis    "spine" (07/28/2014)  . Blood dyscrasia    bruises and bleed easily  . Breast cancer, left breast ([place]) 07/28/2014  . Breast cancer, right breast ([place]) 1995  . Chronic back pain    "all over"  . Complication of anesthesia    went to sleep easily but hard to wake up up until elbow OR in 2010  . Depression   . Dyspnea    Normal Spirometry 03/2008 EF 65% BNP normal 11/2007  . Family history of breast cancer   . Family history of colon cancer   .  Family history of pancreatic cancer   . Gastric polyps   . GERD (gastroesophageal reflux disease)   . History of hiatal hernia   . Hx of adenomatous polyp of colon 07/03/2015  . Kidney stone    right kidney   . Lichen sclerosus   . Melanoma ([place]) 2010   "right elbow; treated at [facility]"  . Mild anxiety   . Vitamin B12 deficiency   . Vitamin D deficiency    Past Surgical History:  Procedure Laterality Date  . BREAST BIOPSY Left 04/2014  . BREAST RECONSTRUCTION WITH PLACEMENT OF TISSUE EXPANDER AND FLEX HD (ACELLULAR HYDRATED DERMIS) Left 07/28/2014   Procedure: LEFT BREAST RECONSTRUCTION PLACEMENT OF LEFT TISSUE EXPANDER ;  Surgeon: Crissie Reese, MD;  Location: [place];  Service: Plastics;  Laterality: Left;  . BREAST RECONSTRUCTION WITH PLACEMENT OF TISSUE EXPANDER AND FLEX HD (ACELLULAR HYDRATED DERMIS) Left 09/08/2014   Procedure: REMOVAL OF TISSUE EXPANDER FROM LEFT BREAST;  Surgeon: Crissie Reese, MD;  Location: [place];  Service: Plastics;  Laterality: Left;  . BUNIONECTOMY Bilateral 1970's  . COLONOSCOPY     Dr Sharlett Iles  . CYSTOSCOPY WITH RETROGRADE PYELOGRAM, URETEROSCOPY AND STENT PLACEMENT Right 06/09/2015   Procedure: CYSTOSCOPY WITH RIGHT RETROGRADE PYELOGRAM, RIGHT URETEROSCOPY AND RIGHT URTERAL STENT PLACEMENT;  Surgeon: Ardis Hughs, MD;  Location: [facility];  Service: Urology;  Laterality: Right;  . ESOPHAGOGASTRODUODENOSCOPY    . HERNIA  REPAIR    . HOLMIUM LASER APPLICATION Right [id number]   Procedure: HOLMIUM LASER APPLICATION;  Surgeon: Ardis Hughs, MD;  Location: [facility];  Service: Urology;  Laterality: Right;  . LATISSIMUS FLAP TO BREAST Left 09/08/2014   Procedure: LEFT LATISSIMUS FLAP TO BREAST WITH SALINE IMPLANT FOR BREAST RECONSTRUCTION;  Surgeon: Crissie Reese, MD;  Location: [place];  Service: Plastics;  Laterality: Left;  Marland Kitchen MASTECTOMY Right 1996    chemotherapy. pt. states 13 lymph nodes were removed  . MASTECTOMY COMPLETE / SIMPLE W/ SENTINEL NODE BIOPSY Left  07/28/2014  . MASTECTOMY W/ SENTINEL NODE BIOPSY Left 07/28/2014   Procedure: LEFT MASTECTOMY WITH SENTINEL LYMPH NODE MAPPING;  Surgeon: Autumn Messing III, MD;  Location: [place];  Service: General;  Laterality: Left;  Marland Kitchen MELANOMA EXCISION Right 2010   From elbow-- [facility] at [facility]   . NISSEN FUNDOPLICATION  [id number]  . RECONSTRUCTION BREAST IMMEDIATE / DELAYED W/ TISSUE EXPANDER Left 07/28/2014  . TEMPOROMANDIBULAR JOINT SURGERY Bilateral 1987  . TONSILLECTOMY     Family History  Problem Relation Age of Onset  . Stroke Other     F 1st degree relative 34, M 1st degree relative  . Colon cancer Cousin 57    double first cousin  . Colon cancer Cousin 59    double first cousin  . Colon polyps Father     between 69-20  . Dementia Father   . Pancreatic cancer Brother 14  . Colon polyps Brother     between 10-20  . Cancer Paternal Uncle     NOS  . Anemia Paternal Grandfather     pernicious anemia  . Breast cancer Cousin 40    double first cousin  . Breast cancer Cousin 74    paternal cousin   History  Sexual Activity  . Sexual activity: Yes    Outpatient Encounter Prescriptions as of 06/27/2016  Medication Sig  . albuterol (PROVENTIL HFA;VENTOLIN HFA) 108 (90 Base) MCG/ACT inhaler Inhale 2 puffs into the lungs every 6 (six) hours as needed for wheezing or shortness of breath.  . anastrozole (ARIMIDEX) 1 MG tablet Take 1 tablet (1 mg total) by mouth daily.  . Cholecalciferol (VITAMIN D3) 1000 UNITS CAPS Take 1 capsule by mouth daily.    . citalopram (CELEXA) 10 MG tablet TAKE 1 TABLET (10 MG TOTAL) BY MOUTH DAILY.  Marland Kitchen Cyanocobalamin (VITAMIN B-12 CR) 1000 MCG TBCR Take 1 tablet by mouth daily.    . furosemide (LASIX) 40 MG tablet TAKE 1 TABLET (40 MG TOTAL) BY MOUTH DAILY.  Marland Kitchen gabapentin (NEURONTIN) 100 MG capsule Take 1 capsule (100 mg total) by mouth at bedtime.  . naproxen sodium (ANAPROX) 220 MG tablet Take 220 mg by mouth daily.  Marland Kitchen OVER THE COUNTER MEDICATION Probiotic 90 billion-Take 1  tablet by mouth daily.  . [DISCONTINUED] benzonatate (TESSALON) 200 MG capsule Take 1 capsule (200 mg total) by mouth 2 (two) times daily as needed for cough.  . [DISCONTINUED] doxycycline (VIBRAMYCIN) 100 MG capsule Take 1 capsule (100 mg total) by mouth 2 (two) times daily.   No facility-administered encounter medications on file as of 06/27/2016.     Activities of Daily Living In your present state of health, do you have any difficulty performing the following activities: 06/27/2016 06/06/2016  Hearing? N N  Vision? N N  Difficulty concentrating or making decisions? N N  Walking or climbing stairs? N N  Dressing or bathing? N N  Doing errands, shopping? N N  Preparing Food and eating ? N -  Using the Toilet? N -  In the past six months, have you accidently leaked urine? N -  Do you have problems with loss of bowel control? N -  Managing your Medications? N -  Managing your Finances? N -  Housekeeping or managing your Housekeeping? N -  Some recent data might be hidden    Patient Care Team: Brunetta Jeans, PA-C as PCP - General (Physician Assistant) Josue Hector, MD (Cardiology) Cheri Fowler, MD as Attending Physician (Obstetrics and Gynecology) Chauncey Cruel, MD (Hematology and Oncology) Gatha Mayer, MD as Consulting Physician (Gastroenterology) Iona Beard, MD as Referring Physician (Optometry) Autumn Messing III, MD as Consulting Physician (General Surgery) Ardis Hughs, MD as Attending Physician (Urology) Charolotte Capuchin, MD as Consulting Physician (General Practice)    Assessment:    (1) Medicare Wellness, Subsequent (2) Annual Examination (3) Hypertension (4) Depression  Exercise Activities and Dietary recommendations Current Exercise Habits: The patient does not participate in regular exercise at present  Goals    . Increase physical activity          Has silver sneakers. Plans to use this year.   Walking and water aerobics      . Travel more and  visit with family.   (pt-stated)      Fall Risk Fall Risk  06/27/2016 06/13/2015 04/06/2015 01/04/2015  Falls in the past year? No No Yes No  Number falls in past yr: - - 1 -  Injury with Fall? - - No -  Follow up - - Education provided;Falls prevention discussed -   Depression Screen PHQ 2/9 Scores 06/27/2016 06/27/2016 06/13/2015 04/06/2015  PHQ - 2 Score 1 1 0 0  PHQ- 9 Score - 1 - -     Cognitive Function MMSE - Mini Mental State Exam 04/06/2015  Orientation to time 5  Orientation to Place 5  Registration 3  Attention/ Calculation 5  Recall 3  Language- name 2 objects 2  Language- repeat 1  Language- follow 3 step command 3  Language- read & follow direction 1  Write a sentence 1  Copy design 1  Total score 30        Immunization History  Administered Date(s) Administered  . Influenza Split 02/06/2011, 02/10/2012  . Influenza Whole 04/14/2007  . Influenza, High Dose Seasonal PF 04/06/2015  . Influenza,inj,Quad PF,36+ Mos 07/29/2014, 02/20/2016  . Pneumococcal Conjugate-13 02/20/2016  . Pneumococcal Polysaccharide-23 07/29/2014  . Tdap 01/03/2013  . Tetanus 01/04/2008   Screening Tests Health Maintenance  Topic Date Due  . Hepatitis C Screening  [birth year]  . ZOSTAVAX  01/27/2006  . MAMMOGRAM  05/09/2016  . COLONOSCOPY  06/27/2020  . DTaP/Tdap/Td (2 - Td) 01/04/2023  . TETANUS/TDAP  01/04/2023  . INFLUENZA VACCINE  Completed  . DEXA SCAN  Completed  . PNA vac Low Risk Adult  Completed      Plan:    (1) During the course of the visit the patient was educated and counseled about the following appropriate screening and preventive services:   Vaccines to include Pneumoccal, Influenza, Hepatitis B, Td, Zostavax, HCV  Electrocardiogram  Cardiovascular Disease  Colorectal cancer screening  Bone density screening  Diabetes screening  Glaucoma screening  Mammography/PAP  Nutrition counseling   (2) Depression screen negative. Health Maintenance  reviewed -- Mammogram up-to-date. Colonoscopy up-to-date. DEXA up-to-date. Immunizations up-to-date with exception of Shingles. She will check with insurance.  Preventive schedule discussed and handout given  in AVS. Will obtain fasting labs today.  (3) Stable. No changes. (4) Stable. No changes. Patient Instructions (the written plan) was given to the patient.   Raiford Damman [place], [place]  06/27/2016

## 2016-06-27 NOTE — Progress Notes (Signed)
Pre visit review using our clinic review tool, if applicable. No additional management support is needed unless otherwise documented below in the visit note. 

## 2016-06-27 NOTE — Patient Instructions (Addendum)
Please go to the lab for blood work.   Our office will call you with your results unless you have chosen to receive results via MyChart.  If your blood work is normal we will follow-up each year for physicals and as scheduled for chronic medical problems.  If anything is abnormal we will treat accordingly and get you in for a follow-up.  Please continue chronic medications as directed. Start the [place] as directed.  Use OTC Delysm for cough. If not improving, we will need further assessment.    Preventive Care 39 Years and Older, Female Preventive care refers to lifestyle choices and visits with your health care provider that can promote health and wellness. What does preventive care include?  A yearly physical exam. This is also called an annual well check.  Dental exams once or twice a year.  Routine eye exams. Ask your health care provider how often you should have your eyes checked.  Personal lifestyle choices, including:  Daily care of your teeth and gums.  Regular physical activity.  Eating a healthy diet.  Avoiding tobacco and drug use.  Limiting alcohol use.  Practicing safe sex.  Taking low-dose aspirin every day.  Taking vitamin and mineral supplements as recommended by your health care provider. What happens during an annual well check? The services and screenings done by your health care provider during your annual well check will depend on your age, overall health, lifestyle risk factors, and family history of disease. Counseling  Your health care provider may ask you questions about your:  Alcohol use.  Tobacco use.  Drug use.  Emotional well-being.  Home and relationship well-being.  Sexual activity.  Eating habits.  History of falls.  Memory and ability to understand (cognition).  Work and work Statistician.  Reproductive health. Screening  You may have the following tests or measurements:  Height, weight, and BMI.  Blood  pressure.  Lipid and cholesterol levels. These may be checked every 5 years, or more frequently if you are over 84 years old.  Skin check.  Lung cancer screening. You may have this screening every year starting at age 64 if you have a 30-pack-year history of smoking and currently smoke or have quit within the past 15 years.  Fecal occult blood test (FOBT) of the stool. You may have this test every year starting at age 43.  Flexible sigmoidoscopy or colonoscopy. You may have a sigmoidoscopy every 5 years or a colonoscopy every 10 years starting at age 15.  Hepatitis C blood test.  Hepatitis B blood test.  Sexually transmitted disease (STD) testing.  Diabetes screening. This is done by checking your blood sugar (glucose) after you have not eaten for a while (fasting). You may have this done every 1-3 years.  Bone density scan. This is done to screen for osteoporosis. You may have this done starting at age 33.  Mammogram. This may be done every 1-2 years. Talk to your health care provider about how often you should have regular mammograms. Talk with your health care provider about your test results, treatment options, and if necessary, the need for more tests. Vaccines  Your health care provider may recommend certain vaccines, such as:  Influenza vaccine. This is recommended every year.  Tetanus, diphtheria, and acellular pertussis (Tdap, Td) vaccine. You may need a Td booster every 10 years.  Varicella vaccine. You may need this if you have not been vaccinated.  Zoster vaccine. You may need this after age 62.  Measles,  mumps, and rubella (MMR) vaccine. You may need at least one dose of MMR if you were born in 1957 or later. You may also need a second dose.  Pneumococcal 13-valent conjugate (PCV13) vaccine. One dose is recommended after age 49.  Pneumococcal polysaccharide (PPSV23) vaccine. One dose is recommended after age 43.  Meningococcal vaccine. You may need this if you  have certain conditions.  Hepatitis A vaccine. You may need this if you have certain conditions or if you travel or work in places where you may be exposed to hepatitis A.  Hepatitis B vaccine. You may need this if you have certain conditions or if you travel or work in places where you may be exposed to hepatitis B.  Haemophilus influenzae type b (Hib) vaccine. You may need this if you have certain conditions. Talk to your health care provider about which screenings and vaccines you need and how often you need them. This information is not intended to replace advice given to you by your health care provider. Make sure you discuss any questions you have with your health care provider. Document Released: 06/09/2015 Document Revised: 01/31/2016 Document Reviewed: 03/14/2015 Elsevier Interactive Patient Education  2017 [employer].

## 2016-06-28 ENCOUNTER — Other Ambulatory Visit: Payer: Self-pay | Admitting: Physician Assistant

## 2016-06-28 DIAGNOSIS — E785 Hyperlipidemia, unspecified: Secondary | ICD-10-CM

## 2016-06-28 MED ORDER — ATORVASTATIN CALCIUM 10 MG PO TABS: 10.0000 mg | ORAL_TABLET | Freq: Every day | ORAL | 1 refills | Status: DC

## 2016-06-30 DIAGNOSIS — Z0000 Encounter for general adult medical examination without abnormal findings: Secondary | ICD-10-CM

## 2016-06-30 HISTORY — DX: Encounter for general adult medical examination without abnormal findings: Z00.00

## 2016-07-01 ENCOUNTER — Telehealth: Payer: Self-pay | Admitting: Physician Assistant

## 2016-07-01 NOTE — Telephone Encounter (Signed)
Pt calling to let Einar Pheasant know that her insurance will cover the shingles shot & Hep C. Pt has scheduled a f/u lab visit at the [facility] location on 3/9 and asking if she could get the Hep C at that time and if the shingles shot could be call into pharmacy.

## 2016-07-02 ENCOUNTER — Other Ambulatory Visit: Payer: Self-pay | Admitting: Physician Assistant

## 2016-07-02 ENCOUNTER — Encounter: Payer: Self-pay | Admitting: Emergency Medicine

## 2016-07-02 DIAGNOSIS — Z1159 Encounter for screening for other viral diseases: Secondary | ICD-10-CM

## 2016-07-02 NOTE — Telephone Encounter (Signed)
Please advise if ok to place hep c order and rx for shingles

## 2016-07-02 NOTE — Telephone Encounter (Signed)
Ok to place lab order for Hep C antibody. Please verify again with patient that she has had chicken pox before. We discussed at visit but is not in her history list. I want her to speak with her Oncologist to see if they want her to have the Shingles vaccine giving it is a live virus.

## 2016-07-02 NOTE — Telephone Encounter (Signed)
Advised patient we will place the Hep c for march. Patient will speak with Oncologist if ok with getting the Shingles vaccine since it is live vaccine. Patient does states she did have history of chicken pox when younger.

## 2016-07-20 DIAGNOSIS — T7840XA Allergy, unspecified, initial encounter: Secondary | ICD-10-CM | POA: Diagnosis not present

## 2016-08-02 ENCOUNTER — Other Ambulatory Visit: Payer: Medicare HMO

## 2016-08-02 ENCOUNTER — Other Ambulatory Visit (INDEPENDENT_AMBULATORY_CARE_PROVIDER_SITE_OTHER): Payer: Medicare HMO

## 2016-08-02 DIAGNOSIS — Z1159 Encounter for screening for other viral diseases: Secondary | ICD-10-CM | POA: Diagnosis not present

## 2016-08-02 DIAGNOSIS — E785 Hyperlipidemia, unspecified: Secondary | ICD-10-CM

## 2016-08-02 LAB — HEPATIC FUNCTION PANEL
ALK PHOS: 59 U/L (ref 39–117)
ALT: 24 U/L (ref 0–35)
AST: 24 U/L (ref 0–37)
Albumin: 4.1 g/dL (ref 3.5–5.2)
BILIRUBIN DIRECT: 0.1 mg/dL (ref 0.0–0.3)
BILIRUBIN TOTAL: 0.4 mg/dL (ref 0.2–1.2)
Total Protein: 6.8 g/dL (ref 6.0–8.3)

## 2016-08-02 LAB — LIPID PANEL
CHOLESTEROL: 144 mg/dL (ref 0–200)
HDL: 46.8 mg/dL (ref >39.00)
LDL Cholesterol: 77 mg/dL (ref 0–99)
NONHDL: 97.46
Total CHOL/HDL Ratio: 3
Triglycerides: 102 mg/dL (ref 0.0–149.0)
VLDL: 20.4 mg/dL (ref 0.0–40.0)

## 2016-08-02 LAB — HEPATITIS C ANTIBODY: HCV Ab: NEGATIVE

## 2016-08-06 ENCOUNTER — Telehealth: Payer: Self-pay | Admitting: *Deleted

## 2016-08-06 ENCOUNTER — Encounter: Payer: Self-pay | Admitting: Oncology

## 2016-08-06 NOTE — Telephone Encounter (Signed)
"  I keep calling to reach scheduler.  I need to reschedule the MD appointment on 08-21-2016 due to [facility] duty.  The lab appointment on 08-14-2016 is okay.  I need something instead of 08-21-2016."  Call transferred to scheduler [phone].  MD appointment has previously been cancelled.

## 2016-08-07 NOTE — Telephone Encounter (Signed)
This RN noted appointment for 3/28 cancelled by operator - this RN sent a LOS requesting appointment to be rescheduled.  Called and informed pt.

## 2016-08-10 ENCOUNTER — Telehealth: Payer: Self-pay | Admitting: Oncology

## 2016-08-10 NOTE — Telephone Encounter (Signed)
Scheduled appt per Rn Val Sch msg. Patient is aware of new date and time

## 2016-08-13 ENCOUNTER — Other Ambulatory Visit: Payer: Self-pay | Admitting: Adult Health

## 2016-08-13 DIAGNOSIS — C50911 Malignant neoplasm of unspecified site of right female breast: Secondary | ICD-10-CM

## 2016-08-13 DIAGNOSIS — R69 Illness, unspecified: Secondary | ICD-10-CM | POA: Diagnosis not present

## 2016-08-13 DIAGNOSIS — Z170 Estrogen receptor positive status [ER+]: Principal | ICD-10-CM

## 2016-08-14 ENCOUNTER — Other Ambulatory Visit (HOSPITAL_BASED_OUTPATIENT_CLINIC_OR_DEPARTMENT_OTHER): Payer: Medicare HMO

## 2016-08-14 DIAGNOSIS — Z170 Estrogen receptor positive status [ER+]: Principal | ICD-10-CM

## 2016-08-14 DIAGNOSIS — C50412 Malignant neoplasm of upper-outer quadrant of left female breast: Secondary | ICD-10-CM | POA: Diagnosis not present

## 2016-08-14 DIAGNOSIS — C50911 Malignant neoplasm of unspecified site of right female breast: Secondary | ICD-10-CM

## 2016-08-14 LAB — COMPREHENSIVE METABOLIC PANEL
ALBUMIN: 3.8 g/dL (ref 3.5–5.0)
ALK PHOS: 71 U/L (ref 40–150)
ALT: 23 U/L (ref 0–55)
AST: 24 U/L (ref 5–34)
Anion Gap: 7 mEq/L (ref 3–11)
BILIRUBIN TOTAL: 0.56 mg/dL (ref 0.20–1.20)
BUN: 13.3 mg/dL (ref 7.0–26.0)
CO2: 27 meq/L (ref 22–29)
CREATININE: 0.9 mg/dL (ref 0.6–1.1)
Calcium: 9.7 mg/dL (ref 8.4–10.4)
Chloride: 108 mEq/L (ref 98–109)
EGFR: 66 mL/min/{1.73_m2} — ABNORMAL LOW (ref >90)
GLUCOSE: 67 mg/dL — AB (ref 70–140)
Potassium: 4.2 mEq/L (ref 3.5–5.1)
SODIUM: 143 meq/L (ref 136–145)
TOTAL PROTEIN: 6.6 g/dL (ref 6.4–8.3)

## 2016-08-14 LAB — CBC WITH DIFFERENTIAL/PLATELET
BASO%: 0.9 % (ref 0.0–2.0)
Basophils Absolute: 0 10*3/uL (ref 0.0–0.1)
EOS ABS: 0.3 10*3/uL (ref 0.0–0.5)
EOS%: 5.1 % (ref 0.0–7.0)
HCT: 41.9 % (ref 34.8–46.6)
HEMOGLOBIN: 14.3 g/dL (ref 11.6–15.9)
LYMPH#: 1.4 10*3/uL (ref 0.9–3.3)
LYMPH%: 24.8 % (ref 14.0–49.7)
MCH: 31 pg (ref 25.1–34.0)
MCHC: 34 g/dL (ref 31.5–36.0)
MCV: 91 fL (ref 79.5–101.0)
MONO#: 0.5 10*3/uL (ref 0.1–0.9)
MONO%: 9.4 % (ref 0.0–14.0)
NEUT%: 59.8 % (ref 38.4–76.8)
NEUTROS ABS: 3.3 10*3/uL (ref 1.5–6.5)
PLATELETS: 188 10*3/uL (ref 145–400)
RBC: 4.6 10*6/uL (ref 3.70–5.45)
RDW: 13.3 % (ref 11.2–14.5)
WBC: 5.5 10*3/uL (ref 3.9–10.3)

## 2016-08-16 ENCOUNTER — Other Ambulatory Visit: Payer: Self-pay | Admitting: Emergency Medicine

## 2016-08-16 DIAGNOSIS — E785 Hyperlipidemia, unspecified: Secondary | ICD-10-CM

## 2016-08-16 MED ORDER — ATORVASTATIN CALCIUM 10 MG PO TABS: 10.0000 mg | ORAL_TABLET | Freq: Every day | ORAL | 1 refills | Status: DC

## 2016-08-19 ENCOUNTER — Ambulatory Visit (INDEPENDENT_AMBULATORY_CARE_PROVIDER_SITE_OTHER): Payer: Medicare HMO | Admitting: Physician Assistant

## 2016-08-19 ENCOUNTER — Encounter: Payer: Self-pay | Admitting: Physician Assistant

## 2016-08-19 VITALS — BP 128/84 | HR 68 | Temp 98.6°F | Resp 14 | Ht 67.0 in | Wt 193.0 lb

## 2016-08-19 DIAGNOSIS — H6983 Other specified disorders of Eustachian tube, bilateral: Secondary | ICD-10-CM

## 2016-08-19 DIAGNOSIS — J0111 Acute recurrent frontal sinusitis: Secondary | ICD-10-CM | POA: Diagnosis not present

## 2016-08-19 DIAGNOSIS — K219 Gastro-esophageal reflux disease without esophagitis: Secondary | ICD-10-CM | POA: Diagnosis not present

## 2016-08-19 DIAGNOSIS — H6993 Unspecified Eustachian tube disorder, bilateral: Secondary | ICD-10-CM

## 2016-08-19 MED ORDER — PREDNISONE 20 MG PO TABS: 40.0000 mg | ORAL_TABLET | Freq: Every day | ORAL | 0 refills | Status: DC

## 2016-08-19 MED ORDER — BENZONATATE 100 MG PO CAPS: 100.0000 mg | ORAL_CAPSULE | Freq: Two times a day (BID) | ORAL | 0 refills | Status: DC | PRN

## 2016-08-19 MED ORDER — PANTOPRAZOLE SODIUM 40 MG PO TBEC: 40.0000 mg | DELAYED_RELEASE_TABLET | Freq: Every day | ORAL | 3 refills | Status: DC

## 2016-08-19 MED ORDER — AMOXICILLIN-POT CLAVULANATE 875-125 MG PO TABS: 1.0000 | ORAL_TABLET | Freq: Two times a day (BID) | ORAL | 0 refills | Status: DC

## 2016-08-19 NOTE — Progress Notes (Signed)
Patient presents to clinic today c/o 3 weeks of sinus pressure, sinus pain with PND and dry cough that is substantially worse at night. Denies fever. Notes bilateral ear pressure/pain and maxillary sinus pain. Denies chest congestion, chest pain or SOB. Patient with prior history of significant GERD s/p fundoplication. Has noted recurrence of acid reflux symptoms with heart burn, bloating. Now noting voice hoarseness. Denies dysphagia or odynophagia. Is not currently taking anything for symptoms.  Past Medical History:  Diagnosis Date  . Arthritis    "spine" (07/28/2014)  . Blood dyscrasia    bruises and bleed easily  . Breast cancer, left breast ([place]) 07/28/2014  . Breast cancer, right breast ([place]) 1995  . Chronic back pain    "all over"  . Complication of anesthesia    went to sleep easily but hard to wake up up until elbow OR in 2010  . Depression   . Dyspnea    Normal Spirometry 03/2008 EF 65% BNP normal 11/2007  . Family history of breast cancer   . Family history of colon cancer   . Family history of pancreatic cancer   . Gastric polyps   . GERD (gastroesophageal reflux disease)   . History of chicken pox   . History of hiatal hernia   . Hx of adenomatous polyp of colon 07/03/2015  . Kidney stone    right kidney   . Lichen sclerosus   . Melanoma ([place]) 2010   "right elbow; [facility] at [facility]"  . Mild anxiety   . Vitamin B12 deficiency   . Vitamin D deficiency     Current Outpatient Prescriptions on File Prior to Visit  Medication Sig Dispense Refill  . albuterol (PROVENTIL HFA;VENTOLIN HFA) 108 (90 Base) MCG/ACT inhaler Inhale 2 puffs into the lungs every 6 (six) hours as needed for wheezing or shortness of breath. 1 Inhaler 0  . anastrozole (ARIMIDEX) 1 MG tablet Take 1 tablet (1 mg total) by mouth daily. 90 tablet 4  . atorvastatin (LIPITOR) 10 MG tablet Take 1 tablet (10 mg total) by mouth daily. 90 tablet 1  . Cholecalciferol (VITAMIN D3) 1000 UNITS CAPS Take 1 capsule by  mouth daily.      . citalopram (CELEXA) 10 MG tablet TAKE 1 TABLET (10 MG TOTAL) BY MOUTH DAILY. 30 tablet 5  . Cyanocobalamin (VITAMIN B-12 CR) 1000 MCG TBCR Take 1 tablet by mouth daily.      . furosemide (LASIX) 40 MG tablet TAKE 1 TABLET (40 MG TOTAL) BY MOUTH DAILY. 90 tablet 0  . gabapentin (NEURONTIN) 100 MG capsule Take 1 capsule (100 mg total) by mouth at bedtime. 30 capsule 3  . naproxen sodium (ANAPROX) 220 MG tablet Take 220 mg by mouth daily.    Marland Kitchen OVER THE COUNTER MEDICATION Probiotic 90 billion-Take 1 tablet by mouth daily.    Marland Kitchen levocetirizine (XYZAL) 5 MG tablet Take 1 tablet (5 mg total) by mouth every evening. (Patient not taking: Reported on 08/19/2016) 30 tablet 1  . [DISCONTINUED] sucralfate (CARAFATE) 1 GM/10ML suspension Take 1 g by mouth at bedtime as needed.       No current facility-administered medications on file prior to visit.     Allergies  Allergen Reactions  . Silodosin Rash    Facial rash  . Ace Inhibitors Other (See Comments)    REACTION: angioedema  . Buprenorphine Hcl Other (See Comments)    "crazy"  . Morphine And Related Other (See Comments)    "crazy"  . Codeine  Rash  . Sulfonamide Derivatives Rash  . Telmisartan Rash    Family History  Problem Relation Age of Onset  . Stroke Other     F 1st degree relative 6, M 1st degree relative  . Colon cancer Cousin 59    double first cousin  . Colon cancer Cousin 35    double first cousin  . Colon polyps Father     between 72-20  . Dementia Father   . Pancreatic cancer Brother 12  . Colon polyps Brother     between 10-20  . Cancer Paternal Uncle     NOS  . Anemia Paternal Grandfather     pernicious anemia  . Breast cancer Cousin 21    double first cousin  . Breast cancer Cousin 74    paternal cousin    Social History   Social History  . Marital status: Married    Spouse name: N/A  . Number of children: 2  . Years of education: N/A   Occupational History  . Manager    Social  History Main Topics  . Smoking status: Never Smoker  . Smokeless tobacco: Never Used     Comment: Regular Exercise - Yes  . Alcohol use No  . Drug use: No  . Sexual activity: Yes   Other Topics Concern  . None   Social History Narrative  . None    Review of Systems - See HPI.  All other ROS are negative.  There were no vitals taken for this visit.  Physical Exam  Constitutional: She is oriented to person, place, and time and well-developed, well-nourished, and in no distress.  HENT:  Head: Normocephalic and atraumatic.  Right Ear: External ear normal.  Left Ear: Tympanic membrane is not erythematous, not retracted and not bulging. A middle ear effusion is present.  Nose: Mucosal edema and rhinorrhea present. Right sinus exhibits frontal sinus tenderness. Left sinus exhibits frontal sinus tenderness.  Mouth/Throat: Uvula is midline and mucous membranes are normal. No oropharyngeal exudate.  Eyes: Conjunctivae are normal.  Neck: Neck supple.  Cardiovascular: Normal rate, regular rhythm, normal heart sounds and intact distal pulses.   Pulmonary/Chest: Effort normal and breath sounds normal. No respiratory distress. She has no wheezes. She has no rales. She exhibits no tenderness.  Abdominal: Soft. Bowel sounds are normal. She exhibits no distension. There is no tenderness.  Lymphadenopathy:    She has no cervical adenopathy.  Neurological: She is alert and oriented to person, place, and time.  Skin: Skin is warm and dry. No rash noted.  Psychiatric: Affect normal.  Vitals reviewed.  Recent Results (from the past 2160 hour(s))  Comprehensive metabolic panel     Status: Abnormal   Collection Time: 06/27/16 10:48 AM  Result Value Ref Range   Sodium 143 135 - 145 mEq/L   Potassium 4.5 3.5 - 5.1 mEq/L   Chloride 106 96 - 112 mEq/L   CO2 33 (H) 19 - 32 mEq/L   Glucose, Bld 87 70 - 99 mg/dL   BUN 20 6 - 23 mg/dL   Creatinine, Ser 0.85 0.40 - 1.20 mg/dL   Total Bilirubin 0.5  0.2 - 1.2 mg/dL   Alkaline Phosphatase 56 39 - 117 U/L   AST 20 0 - 37 U/L   ALT 20 0 - 35 U/L   Total Protein 6.6 6.0 - 8.3 g/dL   Albumin 4.2 3.5 - 5.2 g/dL   Calcium 9.4 8.4 - 10.5 mg/dL   GFR 70.19 >60.00  mL/min  Lipid panel     Status: Abnormal   Collection Time: 06/27/16 10:48 AM  Result Value Ref Range   Cholesterol 216 (H) 0 - 200 mg/dL    Comment: ATP III Classification       Desirable:  < 200 mg/dL               Borderline High:  200 - 239 mg/dL          High:  > = 240 mg/dL   Triglycerides 83.0 0.0 - 149.0 mg/dL    Comment: Normal:  <150 mg/dLBorderline High:  150 - 199 mg/dL   HDL 49.80 >39.00 mg/dL   VLDL 16.6 0.0 - 40.0 mg/dL   LDL Cholesterol 149 (H) 0 - 99 mg/dL   Total CHOL/HDL Ratio 4     Comment:                Men          Women1/2 Average Risk     3.4          3.3Average Risk          5.0          4.42X Average Risk          9.6          7.13X Average Risk          15.0          11.0                       NonHDL 165.89     Comment: NOTE:  Non-HDL goal should be 30 mg/dL higher than patient's LDL goal (i.e. LDL goal of < 70 mg/dL, would have non-HDL goal of < 100 mg/dL)  TSH     Status: None   Collection Time: 06/27/16 10:48 AM  Result Value Ref Range   TSH 1.71 0.35 - 4.50 uIU/mL  Urinalysis, Routine w reflex microscopic     Status: None   Collection Time: 06/27/16 10:48 AM  Result Value Ref Range   Color, Urine YELLOW Yellow;Lt. Yellow   APPearance CLEAR Clear   Specific Gravity, Urine 1.015 1.000 - 1.030   pH 7.5 5.0 - 8.0   Total Protein, Urine NEGATIVE Negative   Urine Glucose NEGATIVE Negative   Ketones, ur NEGATIVE Negative   Bilirubin Urine NEGATIVE Negative   Hgb urine dipstick NEGATIVE Negative   Urobilinogen, UA 0.2 0.0 - 1.0   Leukocytes, UA NEGATIVE Negative   Nitrite NEGATIVE Negative   WBC, UA 0-2/hpf 0-2/hpf   Squamous Epithelial / LPF Rare(0-4/hpf) Rare(0-4/hpf)  Lipid panel     Status: None   Collection Time: 08/02/16  9:25 AM  Result  Value Ref Range   Cholesterol 144 0 - 200 mg/dL    Comment: ATP III Classification       Desirable:  < 200 mg/dL               Borderline High:  200 - 239 mg/dL          High:  > = 240 mg/dL   Triglycerides 102.0 0.0 - 149.0 mg/dL    Comment: Normal:  <150 mg/dLBorderline High:  150 - 199 mg/dL   HDL 46.80 >39.00 mg/dL   VLDL 20.4 0.0 - 40.0 mg/dL   LDL Cholesterol 77 0 - 99 mg/dL   Total CHOL/HDL Ratio 3     Comment:  Men          Women1/2 Average Risk     3.4          3.3Average Risk          5.0          4.42X Average Risk          9.6          7.13X Average Risk          15.0          11.0                       NonHDL 97.46     Comment: NOTE:  Non-HDL goal should be 30 mg/dL higher than patient's LDL goal (i.e. LDL goal of < 70 mg/dL, would have non-HDL goal of < 100 mg/dL)  Hepatic function panel     Status: None   Collection Time: 08/02/16  9:25 AM  Result Value Ref Range   Total Bilirubin 0.4 0.2 - 1.2 mg/dL   Bilirubin, Direct 0.1 0.0 - 0.3 mg/dL   Alkaline Phosphatase 59 39 - 117 U/L   AST 24 0 - 37 U/L   ALT 24 0 - 35 U/L   Total Protein 6.8 6.0 - 8.3 g/dL   Albumin 4.1 3.5 - 5.2 g/dL  Hepatitis C Antibody     Status: None   Collection Time: 08/02/16  9:25 AM  Result Value Ref Range   HCV Ab NEGATIVE NEGATIVE  CBC with Differential     Status: None   Collection Time: 08/14/16 12:00 PM  Result Value Ref Range   WBC 5.5 3.9 - 10.3 10e3/uL   NEUT# 3.3 1.5 - 6.5 10e3/uL   HGB 14.3 11.6 - 15.9 g/dL   HCT 41.9 34.8 - 46.6 %   Platelets 188 145 - 400 10e3/uL   MCV 91.0 79.5 - 101.0 fL   MCH 31.0 25.1 - 34.0 pg   MCHC 34.0 31.5 - 36.0 g/dL   RBC 4.60 3.70 - 5.45 10e6/uL   RDW 13.3 11.2 - 14.5 %   lymph# 1.4 0.9 - 3.3 10e3/uL   MONO# 0.5 0.1 - 0.9 10e3/uL   Eosinophils Absolute 0.3 0.0 - 0.5 10e3/uL   Basophils Absolute 0.0 0.0 - 0.1 10e3/uL   NEUT% 59.8 38.4 - 76.8 %   LYMPH% 24.8 14.0 - 49.7 %   MONO% 9.4 0.0 - 14.0 %   EOS% 5.1 0.0 - 7.0 %   BASO% 0.9  0.0 - 2.0 %  Comprehensive metabolic panel     Status: Abnormal   Collection Time: 08/14/16 12:00 PM  Result Value Ref Range   Sodium 143 136 - 145 mEq/L   Potassium 4.2 3.5 - 5.1 mEq/L   Chloride 108 98 - 109 mEq/L   CO2 27 22 - 29 mEq/L   Glucose 67 (L) 70 - 140 mg/dl    Comment: Glucose reference range is for nonfasting patients. Fasting glucose reference range is 70- 100.   BUN 13.3 7.0 - 26.0 mg/dL   Creatinine 0.9 0.6 - 1.1 mg/dL   Total Bilirubin 0.56 0.20 - 1.20 mg/dL   Alkaline Phosphatase 71 40 - 150 U/L   AST 24 5 - 34 U/L   ALT 23 0 - 55 U/L   Total Protein 6.6 6.4 - 8.3 g/dL   Albumin 3.8 3.5 - 5.0 g/dL   Calcium 9.7 8.4 - 10.4 mg/dL   Anion Gap 7  3 - 11 mEq/L   EGFR 66 (L) >90 ml/min/1.73 m2    Comment: eGFR is calculated using the CKD-EPI Creatinine Equation (2009)   Assessment/Plan: 1. Acute recurrent frontal sinusitis Rx Augmenting. Start 3 day burst of prednisone. Tessalon for cough. Supportive measures reviewed. Giving constellation of symptoms along with recurrent GERD and ETD, referral to ENT placed. Patient has appt scheduled this week.  - amoxicillin-clavulanate (AUGMENTIN) 875-125 MG tablet; Take 1 tablet by mouth 2 (two) times daily.  Dispense: 14 tablet; Refill: 0 - benzonatate (TESSALON) 100 MG capsule; Take 1 capsule (100 mg total) by mouth 2 (two) times daily as needed for cough.  Dispense: 20 capsule; Refill: 0 - predniSONE (DELTASONE) 20 MG tablet; Take 2 tablets (40 mg total) by mouth daily with breakfast.  Dispense: 6 tablet; Refill: 0 - Ambulatory referral to ENT  2. Dysfunction of both eustachian tubes Recurrent. Start prednisone burst. Referral to ENT placed. - predniSONE (DELTASONE) 20 MG tablet; Take 2 tablets (40 mg total) by mouth daily with breakfast.  Dispense: 6 tablet; Refill: 0 - Ambulatory referral to ENT  3. Gastroesophageal reflux disease without esophagitis s/p fundoplication. Voice hoarseness present likely form reflux combined  with PND. Will start PPI. Get ENT assessment.  - pantoprazole (PROTONIX) 40 MG tablet; Take 1 tablet (40 mg total) by mouth daily.  Dispense: 30 tablet; Refill: 3 - Ambulatory referral to ENT   Leeanne Rio, PA-C

## 2016-08-19 NOTE — Progress Notes (Signed)
Pre visit review using our clinic review tool, if applicable. No additional management support is needed unless otherwise documented below in the visit note. 

## 2016-08-19 NOTE — Patient Instructions (Signed)
Start Protonix daily as directed. Follow the diet below for reflux. Please call and schedule a follow-up with Dr. Carlean Purl.    Please take antibiotic as directed.  Increase fluid intake.  Use Saline nasal spray.  Take a daily multivitamin. Tessalon and steroids as directed.  Place a humidifier in the bedroom.  Please call or return clinic if symptoms are not improving.  Speak with Levada Dy at the front desk regarding your referral to ENT for further assessment.    Sinusitis Sinusitis is redness, soreness, and swelling (inflammation) of the paranasal sinuses. Paranasal sinuses are air pockets within the bones of your face (beneath the eyes, the middle of the forehead, or above the eyes). In healthy paranasal sinuses, mucus is able to drain out, and air is able to circulate through them by way of your nose. However, when your paranasal sinuses are inflamed, mucus and air can become trapped. This can allow bacteria and other germs to grow and cause infection. Sinusitis can develop quickly and last only a short time (acute) or continue over a long period (chronic). Sinusitis that lasts for more than 12 weeks is considered chronic.  CAUSES  Causes of sinusitis include:  Allergies.  Structural abnormalities, such as displacement of the cartilage that separates your nostrils (deviated septum), which can decrease the air flow through your nose and sinuses and affect sinus drainage.  Functional abnormalities, such as when the small hairs (cilia) that line your sinuses and help remove mucus do not work properly or are not present. SYMPTOMS  Symptoms of acute and chronic sinusitis are the same. The primary symptoms are pain and pressure around the affected sinuses. Other symptoms include:  Upper toothache.  Earache.  Headache.  Bad breath.  Decreased sense of smell and taste.  A cough, which worsens when you are lying flat.  Fatigue.  Fever.  Thick drainage from your nose, which often is  green and may contain pus (purulent).  Swelling and warmth over the affected sinuses. DIAGNOSIS  Your caregiver will perform a physical exam. During the exam, your caregiver may:  Look in your nose for signs of abnormal growths in your nostrils (nasal polyps).  Tap over the affected sinus to check for signs of infection.  View the inside of your sinuses (endoscopy) with a special imaging device with a light attached (endoscope), which is inserted into your sinuses. If your caregiver suspects that you have chronic sinusitis, one or more of the following tests may be recommended:  Allergy tests.  Nasal culture A sample of mucus is taken from your nose and sent to a lab and screened for bacteria.  Nasal cytology A sample of mucus is taken from your nose and examined by your caregiver to determine if your sinusitis is related to an allergy. TREATMENT  Most cases of acute sinusitis are related to a viral infection and will resolve on their own within 10 days. Sometimes medicines are prescribed to help relieve symptoms (pain medicine, decongestants, nasal steroid sprays, or saline sprays).  However, for sinusitis related to a bacterial infection, your caregiver will prescribe antibiotic medicines. These are medicines that will help kill the bacteria causing the infection.  Rarely, sinusitis is caused by a fungal infection. In theses cases, your caregiver will prescribe antifungal medicine. For some cases of chronic sinusitis, surgery is needed. Generally, these are cases in which sinusitis recurs more than 3 times per year, despite other treatments. HOME CARE INSTRUCTIONS   Drink plenty of water. Water helps thin the  mucus so your sinuses can drain more easily.  Use a humidifier.  Inhale steam 3 to 4 times a day (for example, sit in the bathroom with the shower running).  Apply a warm, moist washcloth to your face 3 to 4 times a day, or as directed by your caregiver.  Use saline nasal  sprays to help moisten and clean your sinuses.  Take over-the-counter or prescription medicines for pain, discomfort, or fever only as directed by your caregiver. SEEK IMMEDIATE MEDICAL CARE IF:  You have increasing pain or severe headaches.  You have nausea, vomiting, or drowsiness.  You have swelling around your face.  You have vision problems.  You have a stiff neck.  You have difficulty breathing. MAKE SURE YOU:   Understand these instructions.  Will watch your condition.  Will get help right away if you are not doing well or get worse. Document Released: 05/13/2005 Document Revised: 08/05/2011 Document Reviewed: 05/28/2011 [facility] Patient Information 2014 [place], [place].

## 2016-08-20 DIAGNOSIS — R05 Cough: Secondary | ICD-10-CM | POA: Diagnosis not present

## 2016-08-20 DIAGNOSIS — J329 Chronic sinusitis, unspecified: Secondary | ICD-10-CM | POA: Diagnosis not present

## 2016-08-20 DIAGNOSIS — R51 Headache: Secondary | ICD-10-CM | POA: Diagnosis not present

## 2016-08-21 ENCOUNTER — Ambulatory Visit: Payer: Medicare HMO | Admitting: Oncology

## 2016-09-03 NOTE — Telephone Encounter (Signed)
ERROR

## 2016-09-23 ENCOUNTER — Ambulatory Visit (HOSPITAL_COMMUNITY)
Admission: RE | Admit: 2016-09-23 | Discharge: 2016-09-23 | Disposition: A | Discharge status: Home / Self Care | Payer: Medicare HMO | Source: Ambulatory Visit | Attending: Oncology | Admitting: Oncology

## 2016-09-23 ENCOUNTER — Other Ambulatory Visit: Payer: Self-pay | Admitting: Oncology

## 2016-09-23 ENCOUNTER — Telehealth: Payer: Self-pay | Admitting: *Deleted

## 2016-09-23 ENCOUNTER — Ambulatory Visit (HOSPITAL_BASED_OUTPATIENT_CLINIC_OR_DEPARTMENT_OTHER): Payer: Medicare HMO | Admitting: Oncology

## 2016-09-23 VITALS — BP 162/81 | HR 78 | Temp 98.8°F | Resp 18 | Ht 67.0 in | Wt 199.2 lb

## 2016-09-23 DIAGNOSIS — R609 Edema, unspecified: Secondary | ICD-10-CM

## 2016-09-23 DIAGNOSIS — C50911 Malignant neoplasm of unspecified site of right female breast: Secondary | ICD-10-CM

## 2016-09-23 DIAGNOSIS — Z170 Estrogen receptor positive status [ER+]: Secondary | ICD-10-CM | POA: Diagnosis not present

## 2016-09-23 DIAGNOSIS — M25552 Pain in left hip: Secondary | ICD-10-CM

## 2016-09-23 DIAGNOSIS — R05 Cough: Secondary | ICD-10-CM | POA: Diagnosis not present

## 2016-09-23 DIAGNOSIS — C50412 Malignant neoplasm of upper-outer quadrant of left female breast: Secondary | ICD-10-CM

## 2016-09-23 DIAGNOSIS — M160 Bilateral primary osteoarthritis of hip: Secondary | ICD-10-CM | POA: Diagnosis not present

## 2016-09-23 DIAGNOSIS — M25551 Pain in right hip: Secondary | ICD-10-CM | POA: Diagnosis not present

## 2016-09-23 MED ORDER — ANASTROZOLE 1 MG PO TABS
1.0000 mg | ORAL_TABLET | Freq: Every day | ORAL | 4 refills | Status: DC
Start: 2016-09-23 — End: 2017-10-31

## 2016-09-23 MED ORDER — FUROSEMIDE 40 MG PO TABS: ORAL_TABLET | ORAL | 1 refills | Status: DC

## 2016-09-23 NOTE — Addendum Note (Signed)
Addended by: Leonidas Romberg on: 09/23/2016 04:50 PM   Modules accepted: Orders

## 2016-09-23 NOTE — Telephone Encounter (Signed)
Patient asking if she can have a refill on furosemide 40mg  - she would like this sent to the [facility] on [address]

## 2016-09-23 NOTE — Progress Notes (Signed)
[name]  [phone] [phone]     ID: AMERIAH [name] DOB: 09-02-1945  MR#: [id number]  WUX#:[id number]  Patient Care Team: Brunetta Jeans, PA-C as PCP - General (Physician Assistant) Josue Hector, MD (Cardiology) Cheri Fowler, MD as Attending Physician (Obstetrics and Gynecology) Chauncey Cruel, MD (Hematology and Oncology) Gatha Mayer, MD as Consulting Physician (Gastroenterology) Iona Beard, MD as Referring Physician (Optometry) Autumn Messing III, MD as Consulting Physician (General Surgery) Ardis Hughs, MD as Attending Physician (Urology) Charolotte Capuchin, MD as Consulting Physician (General Practice) PCP: Leeanne Rio, PA-C GYN: SU: Star Age MD, Stevphen Rochester MD OTHER MD: Crissie Reese M.D. , Nena Polio M.D., Silvano Rusk M.D., Stevphen Rochester MD, Sherren Mocha McDiarmid MD  CHIEF COMPLAINT: Bilateral breast cancers  CURRENT TREATMENT: Anastrozole    Malignant neoplasm of upper-outer quadrant of left breast in female, estrogen receptor positive ([place])   05/09/2014 Initial Diagnosis    Left breast  upper-outer quadrant biopsy shows an invasive breast cancer with lobular features, grade 1, estrogen receptor 100% positive, progesterone receptor 100% positive, with an MIB-1 of 8% and no HER-2 amplification       Breast cancer ([place]) (Resolved)   09/08/2014 Initial Diagnosis    Breast cancer       HISTORY OF BREAST CANCER From the original intake note:  [name], now [name], was my patient in 1996, at which time she had an early stage breast cancer treated with mastectomy with TRAM flap reconstruction followed by CMF chemotherapy 8. She did not receive radiation or anti-estrogens. She was released from follow-up early this sensory.  More recently [name] had routine left screening mammography with tomography at the breast Center 04/27/2014. Breast density was category B. A possible mass in the left breast was  noted and on 05/09/2014 she underwent diagnostic left mammography and ultrasonography showing a 9 mm spiculated asymmetry in the left breast upper outer quadrant. This was not palpable. Ultrasound identified a 1.0 cm irregular hypoechoic lesion in that area. Ultrasound of the left axilla was negative.  Biopsy of the left breast mass 05/09/2014 showed ([id number]) an invasive breast cancer with lobular features, grade 1. The tumor was estrogen receptor 100% positive, progesterone receptor 99% positive, both with strong staining intensity. The MIB-1 was 8%. HER-2 was not amplified, the signals ratio being 1.10 and the number per cell 1.70.  On 05/17/2014 the patient underwent bilateral breast MRI. This showed breast composition category C. In the upper outer quadrant of the left breast there was stippled non-masslike enhancement measuring 3.9 cm. Within this region there was an area of slightly increased consolidation corresponding to the smaller mass noted on ultrasonography. There were no abnormal appearing lymph nodes in the right breast was unremarkable.  [name]'s case was presented at the breast cancer multidisciplinary conference 05/18/2014. The patient is a candidate for breast conservation, and if she opts for that her surgeon may wish to do a generous lumpectomy or proceed to biopsy of the more extensive area of stippled non-masslike enhancement. It was felt that since the patient had a mastectomy on the right she might choose a mastectomy on the left. An Oncotype test was also recommended.  [name] was evaluated at the breast clinic 05/18/2014. Her subsequent history is as detailed below  [name] returns today for follow-up of her estrogen receptor positive breast cancers. She continues on anastrozole--we switched her from tamoxifen because of concerns regarding interactions with Celexa. She tolerates  the anastrozole generally well.   Hot flashes and vaginal dryness are not a  major issue. She never developed the arthralgias or myalgias that many patients can experience on this medication. She obtains it at a good price.  REVIEW OF SYSTEMS: [name] has had a persistent cough for the last several months. There has not been any fevers. There is no significant production. She tells me she has been treated with antibiotics and steroids as well as antitussives. She had a cough now during our visit today, which was very uncomfortable for her. In addition to that she has pain in both hips. She has had rehabilitation for that without significant help. She tells me her insurance refuses MRI of that area. Aside from these concerns she has mild right upper extremity swelling, feels moderately fatigued, bruises easily, has stress urinary incontinence, but denies depression or anxiety problems. A detailed review of systems today was otherwise stable  Allergies  Allergen Reactions  . Silodosin Rash    Facial rash  . Ace Inhibitors Other (See Comments)    REACTION: angioedema  . Buprenorphine Hcl Other (See Comments)    "crazy"  . Morphine And Related Other (See Comments)    "crazy"  . Codeine Rash  . Sulfonamide Derivatives Rash  . Telmisartan Rash    Current Outpatient Prescriptions  Medication Sig Dispense Refill  . anastrozole (ARIMIDEX) 1 MG tablet Take 1 tablet (1 mg total) by mouth daily. 90 tablet 4  . atorvastatin (LIPITOR) 10 MG tablet Take 1 tablet (10 mg total) by mouth daily. 90 tablet 1  . benzonatate (TESSALON) 100 MG capsule Take 1 capsule (100 mg total) by mouth 2 (two) times daily as needed for cough. 20 capsule 0  . Cholecalciferol (VITAMIN D3) 1000 UNITS CAPS Take 1 capsule by mouth daily.      . citalopram (CELEXA) 10 MG tablet TAKE 1 TABLET (10 MG TOTAL) BY MOUTH DAILY. 30 tablet 5  . Cyanocobalamin (VITAMIN B-12 CR) 1000 MCG TBCR Take 1 tablet by mouth daily.      . furosemide (LASIX) 40 MG tablet TAKE 1 TABLET (40 MG TOTAL) BY MOUTH DAILY. 90 tablet 0    . gabapentin (NEURONTIN) 100 MG capsule Take 1 capsule (100 mg total) by mouth at bedtime. 30 capsule 3  . levocetirizine (XYZAL) 5 MG tablet Take 1 tablet (5 mg total) by mouth every evening. (Patient not taking: Reported on 08/19/2016) 30 tablet 1  . naproxen sodium (ANAPROX) 220 MG tablet Take 220 mg by mouth daily.    Marland Kitchen OVER THE COUNTER MEDICATION Probiotic 90 billion-Take 1 tablet by mouth daily.    . pantoprazole (PROTONIX) 40 MG tablet Take 1 tablet (40 mg total) by mouth daily. 30 tablet 3   No current facility-administered medications for this visit.     PAST MEDICAL HISTORY: Past Medical History:  Diagnosis Date  . Arthritis    "spine" (07/28/2014)  . Blood dyscrasia    bruises and bleed easily  . Breast cancer, left breast ([place]) 07/28/2014  . Breast cancer, right breast ([place]) 1995  . Chronic back pain    "all over"  . Complication of anesthesia    went to sleep easily but hard to wake up up until elbow OR in 2010  . Depression   . Dyspnea    Normal Spirometry 03/2008 EF 65% BNP normal 11/2007  . Family history of breast cancer   . Family history of colon cancer   . Family history of pancreatic  cancer   . Gastric polyps   . GERD (gastroesophageal reflux disease)   . History of chicken pox   . History of hiatal hernia   . Hx of adenomatous polyp of colon 07/03/2015  . Kidney stone    right kidney   . Lichen sclerosus   . Melanoma ([place]) 2010   "right elbow; treated at [facility]"  . Mild anxiety   . Vitamin B12 deficiency   . Vitamin D deficiency     PAST SURGICAL HISTORY: Past Surgical History:  Procedure Laterality Date  . BREAST BIOPSY Left 04/2014  . BREAST RECONSTRUCTION WITH PLACEMENT OF TISSUE EXPANDER AND FLEX HD (ACELLULAR HYDRATED DERMIS) Left 07/28/2014   Procedure: LEFT BREAST RECONSTRUCTION PLACEMENT OF LEFT TISSUE EXPANDER ;  Surgeon: Crissie Reese, MD;  Location: [facility];  Service: Plastics;  Laterality: Left;  . BREAST RECONSTRUCTION WITH PLACEMENT OF TISSUE  EXPANDER AND FLEX HD (ACELLULAR HYDRATED DERMIS) Left 09/08/2014   Procedure: REMOVAL OF TISSUE EXPANDER FROM LEFT BREAST;  Surgeon: Crissie Reese, MD;  Location: [facility];  Service: Plastics;  Laterality: Left;  . BUNIONECTOMY Bilateral 1970's  . COLONOSCOPY     Dr Sharlett Iles  . CYSTOSCOPY WITH RETROGRADE PYELOGRAM, URETEROSCOPY AND STENT PLACEMENT Right 06/09/2015   Procedure: CYSTOSCOPY WITH RIGHT RETROGRADE PYELOGRAM, RIGHT URETEROSCOPY AND RIGHT URTERAL STENT PLACEMENT;  Surgeon: Ardis Hughs, MD;  Location: [facility];  Service: Urology;  Laterality: Right;  . ESOPHAGOGASTRODUODENOSCOPY    . HERNIA REPAIR    . HOLMIUM LASER APPLICATION Right 3/97/6734   Procedure: HOLMIUM LASER APPLICATION;  Surgeon: Ardis Hughs, MD;  Location: [facility];  Service: Urology;  Laterality: Right;  . LATISSIMUS FLAP TO BREAST Left 09/08/2014   Procedure: LEFT LATISSIMUS FLAP TO BREAST WITH SALINE IMPLANT FOR BREAST RECONSTRUCTION;  Surgeon: Crissie Reese, MD;  Location: [facility];  Service: Plastics;  Laterality: Left;  Marland Kitchen MASTECTOMY Right 1996    chemotherapy. pt. states 13 lymph nodes were removed  . MASTECTOMY COMPLETE / SIMPLE W/ SENTINEL NODE BIOPSY Left 07/28/2014  . MASTECTOMY W/ SENTINEL NODE BIOPSY Left 07/28/2014   Procedure: LEFT MASTECTOMY WITH SENTINEL LYMPH NODE MAPPING;  Surgeon: Autumn Messing III, MD;  Location: [facility];  Service: General;  Laterality: Left;  Marland Kitchen MELANOMA EXCISION Right 2010   From elbow-- Done at [facility]   . NISSEN FUNDOPLICATION  19/3790  . RECONSTRUCTION BREAST IMMEDIATE / DELAYED W/ TISSUE EXPANDER Left 07/28/2014  . TEMPOROMANDIBULAR JOINT SURGERY Bilateral 1987  . TONSILLECTOMY      FAMILY HISTORY Family History  Problem Relation Age of Onset  . Stroke Other     F 1st degree relative 23, M 1st degree relative  . Colon cancer Cousin 34    double first cousin  . Colon cancer Cousin 110    double first cousin  . Colon polyps Father     between 59-20  . Dementia Father   . Pancreatic  cancer Brother 47  . Colon polyps Brother     between 10-20  . Cancer Paternal Uncle     NOS  . Anemia Paternal Grandfather     pernicious anemia  . Breast cancer Cousin 17    double first cousin  . Breast cancer Cousin 18    paternal cousin   the patient's father died at age 86 in the setting of Alzheimer's disease. The patient's mother died at age 19 following a stroke. Myleka has 3 brothers, no sisters. Monie's niece, Kela Millin, was my patient with a  history of breast cancer diagnosed at age 72. She succumbed to that tumor. The patient also has 2 paternal cousins with breast cancer diagnosed at age 64 and age 81. One brother has prostate cancer diagnosed at age 59. The other brother has a history of throat cancer diagnosed at age 83. There is no other history of breast or ovarian cancer in the family to her knowledge.  GENETICS TESTING:  MUTYH mutation, normal BRCA genes  GYNECOLOGIC HISTORY:  No LMP recorded. Patient is not currently having periods (Reason: Chemotherapy). Menarche age 39, first live birth age 47. She is GX P2. She went through menopause in 1996 at the time of her chemotherapy. She did not take hormone replacement. 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 Her husband of 3 years, Marlou Sa, is a Gaffer at [employer]. He matches colors for a [employer]. Ethelwyn has 2 children from a prior marriage, Becky Sax, 71 years old, lives in [place] and is a homemaker; and Corene Cornea, 25, who lives in [place] and works as a Freight forwarder. Cashaw has 4 biological grandchildren. Marlou Sa has a son, Mitzi Hansen, who lives in [place] and worse at [employer]. Marlou Sa has 4 grandchildren of his. The patient is a Psychologist, forensic.    ADVANCED DIRECTIVES: In place; currently Remona's children Becky Sax and Corene Cornea are co- healthcare powers of attorney.  Corene Cornea can be reached at [place]: Social History  Substance Use Topics  . Smoking status: Never Smoker  . Smokeless tobacco: Never Used     Comment: Regular Exercise - Yes  . Alcohol use No     Colonoscopy: FEB 2017  PAP: 2013  Bone density: Remote  Lipid panel:    OBJECTIVE: Middle-aged white woman  Vitals:   09/23/16 0934  BP: (!) 162/81  Pulse: 78  Resp: 18  Temp: 98.8 F (37.1 C)     Body mass index is 31.2 kg/m.    ECOG FS:1 - Symptomatic but completely ambulatory  Sclerae unicteric, pupils round and equal Oropharynx clear and moist No cervical or supraclavicular adenopathy Lungs no rales or rhonchi Heart regular rate and rhythm Abd soft, obese, nontender, positive bowel sounds MSK no focal spinal tenderness, no upper extremity lymphedema Neuro: nonfocal, well oriented, appropriate affect Breasts: Status post bilateral mastectomies and bilateral reconstruction. There is no evidence of local recurrence. Both axillae are benign.    LAB RESULTS:  CMP     Component Value Date/Time   NA 143 08/14/2016 1200   K 4.2 08/14/2016 1200   CL 106 06/27/2016 1048   CO2 27 08/14/2016 1200   GLUCOSE 67 (L) 08/14/2016 1200   BUN 13.3 08/14/2016 1200   CREATININE 0.9 08/14/2016 1200   CALCIUM 9.7 08/14/2016 1200   PROT 6.6 08/14/2016 1200   ALBUMIN 3.8 08/14/2016 1200   AST 24 08/14/2016 1200   ALT 23 08/14/2016 1200   ALKPHOS 71 08/14/2016 1200   BILITOT 0.56 08/14/2016 1200   GFRNONAA 54 (L) 06/07/2015 0905   GFRAA >60 06/07/2015 0905    INo results found for: SPEP, UPEP  Lab Results  Component Value Date   WBC 5.5 08/14/2016   NEUTROABS 3.3 08/14/2016   HGB 14.3 08/14/2016   HCT 41.9 08/14/2016   MCV 91.0 08/14/2016   PLT 188 08/14/2016      Chemistry      Component Value Date/Time  NA 143 08/14/2016 1200   K 4.2 08/14/2016 1200   CL 106 06/27/2016 1048   CO2 27 08/14/2016 1200   BUN 13.3 08/14/2016 1200   CREATININE  0.9 08/14/2016 1200      Component Value Date/Time   CALCIUM 9.7 08/14/2016 1200   ALKPHOS 71 08/14/2016 1200   AST 24 08/14/2016 1200   ALT 23 08/14/2016 1200   BILITOT 0.56 08/14/2016 1200       No results found for: LABCA2  No components found for: [id number]  No results for input(s): INR in the last 168 hours.  Urinalysis    Component Value Date/Time   COLORURINE YELLOW 06/27/2016 1048   APPEARANCEUR CLEAR 06/27/2016 1048   LABSPEC 1.015 06/27/2016 1048   PHURINE 7.5 06/27/2016 1048   GLUCOSEU NEGATIVE 06/27/2016 1048   HGBUR NEGATIVE 06/27/2016 1048   BILIRUBINUR NEGATIVE 06/27/2016 1048   BILIRUBINUR neg 02/29/2016 1621   KETONESUR NEGATIVE 06/27/2016 1048   PROTEINUR neg 02/29/2016 1621   PROTEINUR NEGATIVE 08/28/2014 1438   UROBILINOGEN 0.2 06/27/2016 1048   NITRITE NEGATIVE 06/27/2016 1048   LEUKOCYTESUR NEGATIVE 06/27/2016 1048    RADIOLOGY AND OTHER STUDIES: Chest x-ray and hip films today pending  ASSESSMENT: 71 y.o. [place] woman status post left breast upper outer quadrant biopsy 05/09/2014 for a clinical T1-T2, N0 (stage I-II) invasive lobular breast cancer, estrogen receptor 100% positive, progesterone receptor 99% positive, with an MIB-1 of 8%, and no HER-2 amplification.  (1) history of right modified radical mastectomy with TRAM reconstruction 1996, followed by chemotherapy for 6 months; did not receive antiestrogen therapy or radiation therapy adjuvantly  (2) tamoxifen started neoadjuvantly 05/18/2014  (3) status post left mastectomy and sentinel lymph node sampling 07/28/2014 for a pT1c pN0(i+). Stage IA invasive ductal carcinoma, grade 1, with repeat HER-2 again negative. Margins were ample  (4) Oncotype score of 3 predicts an outside the breast risk of recurrence within 10 years of 4% if the patient's only systemic treatment is tamoxifen for 5 years. It also predicts no significant benefit from chemotherapy.  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 Sequencing and rearrangement analysis for the following 32 genes found no deleterious mutations: APC, ATM, BARD1, BMPR1A, BRCA1, BRCA2, BRIP1, CDH1, CDK4, CDKN2A, CHEK2, EPCAM, GREM1, MLH1, MRE11A, MSH2, MSH6, MUTYH, NBN, NF1, PALB2, PMS2, POLD1, POLE, PTEN, RAD50, RAD51D, SMAD4, SMARCA4, STK11, and TP53.  (a) Heterozygous MUTYH mutations are not associated with an increased risk for cancer in the patient, however, other family members may be at risk for homozygous mutations and therefore family testing should be considered.   PLAN: Jorene is now 2 years out from definitive surgery for her left breast cancer, with no evidence of disease recurrence. This is very favorable.  As far as the breast cancer is concerned we are continuing the anastrozole until she completes a total of 7 years. She is tolerating it well. She will need a repeat bone density before her next years appointment.  I think the cough she is having may be related to reflux or may be related to allergies but in any case I am obtaining a chest x-ray today just to make sure it's nothing else. I'm also obtaining plain films of her hips.  I'm sure that we were dealing with there is arthritis but there is no reason to gas. We will call her with those results. She can then continue to follow through her primary care physician with assurance that we are not dealing with metastatic breast cancer.  I wrote her for compression sleeve for her right upper extremity and 4 additional bras.  She tells me she has been trying to reach Dr. Dortha Kern at [facility] and is not getting any response. I will send her Dr.  Dortha Kern) and note regarding this  She will see me again in one year. She knows to call for any problems that may develop before that visit.  Chauncey Cruel, MD   09/23/2016 10:19 AM Medical Oncology and Hematology [facility] [address] [place], [place] [address] Tel. [phone]    Fax. [phone]

## 2016-09-23 NOTE — Telephone Encounter (Signed)
Spoke with patient to verify how she is taking medication. She is taking every day. Preferred 90 day supply. Refill request sent to the pharmacy-[facility].

## 2016-10-08 DIAGNOSIS — L570 Actinic keratosis: Secondary | ICD-10-CM | POA: Diagnosis not present

## 2016-10-08 DIAGNOSIS — L821 Other seborrheic keratosis: Secondary | ICD-10-CM | POA: Diagnosis not present

## 2016-11-05 ENCOUNTER — Ambulatory Visit (INDEPENDENT_AMBULATORY_CARE_PROVIDER_SITE_OTHER): Payer: Medicare HMO | Admitting: Physician Assistant

## 2016-11-05 ENCOUNTER — Ambulatory Visit (HOSPITAL_BASED_OUTPATIENT_CLINIC_OR_DEPARTMENT_OTHER)
Admission: RE | Admit: 2016-11-05 | Discharge: 2016-11-05 | Disposition: A | Discharge status: Home / Self Care | Payer: Medicare HMO | Source: Ambulatory Visit | Attending: Physician Assistant | Admitting: Physician Assistant

## 2016-11-05 ENCOUNTER — Encounter: Payer: Self-pay | Admitting: Physician Assistant

## 2016-11-05 VITALS — BP 124/80 | HR 67 | Temp 98.1°F | Resp 14 | Ht 67.0 in | Wt 197.0 lb

## 2016-11-05 DIAGNOSIS — R5382 Chronic fatigue, unspecified: Secondary | ICD-10-CM

## 2016-11-05 DIAGNOSIS — M7989 Other specified soft tissue disorders: Secondary | ICD-10-CM | POA: Diagnosis not present

## 2016-11-05 LAB — CBC WITH DIFFERENTIAL/PLATELET
BASOS PCT: 0.9 % (ref 0.0–3.0)
Basophils Absolute: 0.1 10*3/uL (ref 0.0–0.1)
EOS PCT: 3.4 % (ref 0.0–5.0)
Eosinophils Absolute: 0.2 10*3/uL (ref 0.0–0.7)
HCT: 42.4 % (ref 36.0–46.0)
HEMOGLOBIN: 14.4 g/dL (ref 12.0–15.0)
LYMPHS ABS: 1.8 10*3/uL (ref 0.7–4.0)
Lymphocytes Relative: 27.1 % (ref 12.0–46.0)
MCHC: 34 g/dL (ref 30.0–36.0)
MCV: 91.9 fl (ref 78.0–100.0)
MONO ABS: 0.5 10*3/uL (ref 0.1–1.0)
Monocytes Relative: 8.2 % (ref 3.0–12.0)
NEUTROS ABS: 4 10*3/uL (ref 1.4–7.7)
Neutrophils Relative %: 60.4 % (ref 43.0–77.0)
PLATELETS: 178 10*3/uL (ref 150.0–400.0)
RBC: 4.61 Mil/uL (ref 3.87–5.11)
RDW: 13.3 % (ref 11.5–15.5)
WBC: 6.5 10*3/uL (ref 4.0–10.5)

## 2016-11-05 LAB — COMPREHENSIVE METABOLIC PANEL
ALBUMIN: 4.6 g/dL (ref 3.5–5.2)
ALT: 20 U/L (ref 0–35)
AST: 24 U/L (ref 0–37)
Alkaline Phosphatase: 69 U/L (ref 39–117)
BUN: 17 mg/dL (ref 6–23)
CHLORIDE: 103 meq/L (ref 96–112)
CO2: 32 mEq/L (ref 19–32)
Calcium: 9.9 mg/dL (ref 8.4–10.5)
Creatinine, Ser: 0.96 mg/dL (ref 0.40–1.20)
GFR: 60.93 mL/min (ref >60.00)
Glucose, Bld: 95 mg/dL (ref 70–99)
POTASSIUM: 3.9 meq/L (ref 3.5–5.1)
SODIUM: 142 meq/L (ref 135–145)
Total Bilirubin: 0.6 mg/dL (ref 0.2–1.2)
Total Protein: 7 g/dL (ref 6.0–8.3)

## 2016-11-05 LAB — VITAMIN B12

## 2016-11-05 LAB — TSH: TSH: 2.19 u[IU]/mL (ref 0.35–4.50)

## 2016-11-05 LAB — VITAMIN D 25 HYDROXY (VIT D DEFICIENCY, FRACTURES): VITD: 54.11 ng/mL (ref 30.00–100.00)

## 2016-11-05 NOTE — Patient Instructions (Signed)
Please stay hydrated and get plenty of rest. Go to the lab for blood work. I will call you with your results. EKG looks good today. I am sending you to Cardiology for stress testing and EKG.   I will call you with imaging results and we will alter treatment. If you note any chest pain or shortness of breath, please go to the ER.

## 2016-11-05 NOTE — Progress Notes (Signed)
Patient presents to clinic today c/o 2 weeks of left leg swelling in the absence of trauma or injury. Patient with history of peripheral edema, breast cancer (remission). Noticed leg swelling one evening about 2 weeks ago. Took her Lasix as usual without improvement. No swelling in RLE above baseline. Patient denies any chest pain or shortness of breath at rest. Has noted being easily fatigued with exertion. Denies palpitations. Denies any recent surgical procedure, prolonged immobilization or travel.  Past Medical History:  Diagnosis Date  . Arthritis    "spine" (07/28/2014)  . Blood dyscrasia    bruises and bleed easily  . Breast cancer, left breast ([place]) 07/28/2014  . Breast cancer, right breast ([place]) 1995  . Chronic back pain    "all over"  . Complication of anesthesia    went to sleep easily but hard to wake up up until elbow OR in 2010  . Depression   . Dyspnea    Normal Spirometry 03/2008 EF 65% BNP normal 11/2007  . Family history of breast cancer   . Family history of colon cancer   . Family history of pancreatic cancer   . Gastric polyps   . GERD (gastroesophageal reflux disease)   . History of chicken pox   . History of hiatal hernia   . Hx of adenomatous polyp of colon 07/03/2015  . Kidney stone    right kidney   . Lichen sclerosus   . Melanoma ([place]) 2010   "right elbow; treated at [facility]"  . Mild anxiety   . Vitamin B12 deficiency   . Vitamin D deficiency     Current Outpatient Prescriptions on File Prior to Visit  Medication Sig Dispense Refill  . anastrozole (ARIMIDEX) 1 MG tablet Take 1 tablet (1 mg total) by mouth daily. 90 tablet 4  . atorvastatin (LIPITOR) 10 MG tablet Take 1 tablet (10 mg total) by mouth daily. 90 tablet 1  . Cholecalciferol (VITAMIN D3) 1000 UNITS CAPS Take 1 capsule by mouth daily.      . citalopram (CELEXA) 10 MG tablet TAKE 1 TABLET (10 MG TOTAL) BY MOUTH DAILY. 30 tablet 5  . Cyanocobalamin (VITAMIN B-12 CR) 1000 MCG TBCR Take 1 tablet by  mouth daily.      . furosemide (LASIX) 40 MG tablet TAKE 1 TABLET (40 MG TOTAL) BY MOUTH DAILY. 90 tablet 1  . gabapentin (NEURONTIN) 100 MG capsule Take 1 capsule (100 mg total) by mouth at bedtime. 30 capsule 3  . OVER THE COUNTER MEDICATION Probiotic 90 billion-Take 1 tablet by mouth daily.    . pantoprazole (PROTONIX) 40 MG tablet Take 1 tablet (40 mg total) by mouth daily. 30 tablet 3  . [DISCONTINUED] sucralfate (CARAFATE) 1 GM/10ML suspension Take 1 g by mouth at bedtime as needed.       No current facility-administered medications on file prior to visit.     Allergies  Allergen Reactions  . Silodosin Rash    Facial rash  . Ace Inhibitors Other (See Comments)    REACTION: angioedema  . Buprenorphine Hcl Other (See Comments)    "crazy"  . Morphine And Related Other (See Comments)    "crazy"  . Codeine Rash  . Sulfonamide Derivatives Rash  . Telmisartan Rash    Family History  Problem Relation Age of Onset  . Stroke Other        F 1st degree relative 102, M 1st degree relative  . Colon cancer Cousin 32  double first cousin  . Colon cancer Cousin 10       double first cousin  . Colon polyps Father        between 35-20  . Dementia Father   . Pancreatic cancer Brother 86  . Colon polyps Brother        between 10-20  . Cancer Paternal Uncle        NOS  . Anemia Paternal Grandfather        pernicious anemia  . Breast cancer Cousin 31       double first cousin  . Breast cancer Cousin 74       paternal cousin    Social History   Social History  . Marital status: Married    Spouse name: N/A  . Number of children: 2  . Years of education: N/A   Occupational History  . Manager    Social History Main Topics  . Smoking status: Never Smoker  . Smokeless tobacco: Never Used     Comment: Regular Exercise - Yes  . Alcohol use No  . Drug use: No  . Sexual activity: Yes   Other Topics Concern  . None   Social History Narrative  . None   Review of  Systems - See HPI.  All other ROS are negative.  BP 124/80   Pulse 67   Temp 98.1 F (36.7 C) (Oral)   Resp 14   Ht 5' 7" (1.702 m)   Wt 197 lb (89.4 kg)   SpO2 96%   BMI 30.85 kg/m   Physical Exam  Constitutional: She is oriented to person, place, and time and well-developed, well-nourished, and in no distress.  HENT:  Head: Normocephalic and atraumatic.  Eyes: Conjunctivae are normal.  Cardiovascular: Normal rate, regular rhythm, normal heart sounds and intact distal pulses.   Pulses:      Dorsalis pedis pulses are 2+ on the right side, and 2+ on the left side.       Posterior tibial pulses are 2+ on the right side, and 2+ on the left side.  Positive left lower extremity edema -- 1+ pitting. Negative Homan sign on examination. No evidence of superficial thrombosis on examination.  Pulmonary/Chest: Effort normal and breath sounds normal. No respiratory distress. She has no wheezes. She has no rales. She exhibits no tenderness.  Neurological: She is alert and oriented to person, place, and time.  Skin: Skin is warm and dry. No rash noted.  Psychiatric: Affect normal.  Vitals reviewed.  Recent Results (from the past 2160 hour(s))  CBC with Differential     Status: None   Collection Time: 08/14/16 12:00 PM  Result Value Ref Range   WBC 5.5 3.9 - 10.3 10e3/uL   NEUT# 3.3 1.5 - 6.5 10e3/uL   HGB 14.3 11.6 - 15.9 g/dL   HCT 41.9 34.8 - 46.6 %   Platelets 188 145 - 400 10e3/uL   MCV 91.0 79.5 - 101.0 fL   MCH 31.0 25.1 - 34.0 pg   MCHC 34.0 31.5 - 36.0 g/dL   RBC 4.60 3.70 - 5.45 10e6/uL   RDW 13.3 11.2 - 14.5 %   lymph# 1.4 0.9 - 3.3 10e3/uL   MONO# 0.5 0.1 - 0.9 10e3/uL   Eosinophils Absolute 0.3 0.0 - 0.5 10e3/uL   Basophils Absolute 0.0 0.0 - 0.1 10e3/uL   NEUT% 59.8 38.4 - 76.8 %   LYMPH% 24.8 14.0 - 49.7 %   MONO% 9.4 0.0 - 14.0 %  EOS% 5.1 0.0 - 7.0 %   BASO% 0.9 0.0 - 2.0 %  Comprehensive metabolic panel     Status: Abnormal   Collection Time: 08/14/16 12:00 PM   Result Value Ref Range   Sodium 143 136 - 145 mEq/L   Potassium 4.2 3.5 - 5.1 mEq/L   Chloride 108 98 - 109 mEq/L   CO2 27 22 - 29 mEq/L   Glucose 67 (L) 70 - 140 mg/dl    Comment: Glucose reference range is for nonfasting patients. Fasting glucose reference range is 70- 100.   BUN 13.3 7.0 - 26.0 mg/dL   Creatinine 0.9 0.6 - 1.1 mg/dL   Total Bilirubin 0.56 0.20 - 1.20 mg/dL   Alkaline Phosphatase 71 40 - 150 U/L   AST 24 5 - 34 U/L   ALT 23 0 - 55 U/L   Total Protein 6.6 6.4 - 8.3 g/dL   Albumin 3.8 3.5 - 5.0 g/dL   Calcium 9.7 8.4 - 10.4 mg/dL   Anion Gap 7 3 - 11 mEq/L   EGFR 66 (L) >90 ml/min/1.73 m2    Comment: eGFR is calculated using the CKD-EPI Creatinine Equation (2009)   Assessment/Plan: 1. Swelling of left lower extremity US today to r/o clot. If positive will need anticoagulation. If negative, concern for worsening venous insufficiency. May need to adjust furosemide.  - US Venous Img Lower Unilateral Left; Future  2. Chronic fatigue Unclear. EKG obtained with NSR. Unchanged from prior tracings. Will obtain full lab panel. If unremarkable for cause of fatigue, will need cardiology referral for Echo and stress testing. If [place] positive for DVT, would obtain CT to r/o PE as potential, though unlikely, cause of symptoms giving chronicity, absence of chest pain or SOB and stable vitals. - EKG 12-Lead - CBC w/Diff - Comp Met (CMET) - TSH - B12 - VITAMIN D 25 Hydroxy (Vit-D Deficiency, Fractures); Future - VITAMIN D 25 Hydroxy (Vit-D Deficiency, Fractures)   Leeanne Rio, PA-C

## 2016-11-05 NOTE — Progress Notes (Signed)
Pre visit review using our clinic review tool, if applicable. No additional management support is needed unless otherwise documented below in the visit note. 

## 2016-11-07 ENCOUNTER — Other Ambulatory Visit: Payer: Self-pay | Admitting: Physician Assistant

## 2016-11-07 ENCOUNTER — Encounter: Payer: Self-pay | Admitting: Physician Assistant

## 2016-11-07 DIAGNOSIS — R0602 Shortness of breath: Secondary | ICD-10-CM

## 2016-11-11 ENCOUNTER — Telehealth: Payer: Self-pay | Admitting: Physician Assistant

## 2016-11-11 NOTE — Telephone Encounter (Signed)
Pt calling to state that her leg is not doing any better and asking what should she do.

## 2016-11-12 NOTE — Telephone Encounter (Signed)
Spoke with patient and she is agreeable with an appointment on 11/13/16.

## 2016-11-12 NOTE — Telephone Encounter (Signed)
Giving worsening symptoms and not just stable but not improving symptoms, I would honestly recommend reassessment in office today or tomorrow. This way we can assess how much change has occurred to determine if change in medication is appropriate or if we need repeat imaging.

## 2016-11-12 NOTE — Telephone Encounter (Signed)
If no improvement with increase in Lasix, we could consider switching her lasix to torsemide to see if we get a better diuresis. The other option would be to have her see a vascular specialist for further assessment of the valves in her veins, since [place] was negative for a clot as cause of her symptoms.

## 2016-11-12 NOTE — Telephone Encounter (Signed)
Spoke with patient her symptoms is worsening. She states she is having swelling in the left foot and left hip. She is having increased in urination with taking 1.5 tablet of Lasix. Patient is agreeable with medication change and referral to Vascular specialist.

## 2016-11-13 ENCOUNTER — Other Ambulatory Visit: Payer: Self-pay | Admitting: Physician Assistant

## 2016-11-13 ENCOUNTER — Encounter: Payer: Self-pay | Admitting: Physician Assistant

## 2016-11-13 ENCOUNTER — Ambulatory Visit (HOSPITAL_BASED_OUTPATIENT_CLINIC_OR_DEPARTMENT_OTHER)
Admission: RE | Admit: 2016-11-13 | Discharge: 2016-11-13 | Disposition: A | Discharge status: Home / Self Care | Payer: Medicare HMO | Source: Ambulatory Visit | Attending: Physician Assistant | Admitting: Physician Assistant

## 2016-11-13 ENCOUNTER — Encounter (HOSPITAL_BASED_OUTPATIENT_CLINIC_OR_DEPARTMENT_OTHER): Payer: Self-pay

## 2016-11-13 ENCOUNTER — Ambulatory Visit (INDEPENDENT_AMBULATORY_CARE_PROVIDER_SITE_OTHER): Payer: Medicare HMO | Admitting: Physician Assistant

## 2016-11-13 VITALS — BP 140/82 | HR 71 | Temp 98.2°F | Resp 14 | Ht 67.0 in | Wt 196.0 lb

## 2016-11-13 DIAGNOSIS — R202 Paresthesia of skin: Secondary | ICD-10-CM

## 2016-11-13 DIAGNOSIS — M5134 Other intervertebral disc degeneration, thoracic region: Secondary | ICD-10-CM | POA: Diagnosis not present

## 2016-11-13 DIAGNOSIS — G629 Polyneuropathy, unspecified: Secondary | ICD-10-CM

## 2016-11-13 DIAGNOSIS — R0789 Other chest pain: Secondary | ICD-10-CM | POA: Diagnosis not present

## 2016-11-13 DIAGNOSIS — R7989 Other specified abnormal findings of blood chemistry: Secondary | ICD-10-CM | POA: Diagnosis not present

## 2016-11-13 DIAGNOSIS — I700 Atherosclerosis of aorta: Secondary | ICD-10-CM | POA: Diagnosis not present

## 2016-11-13 DIAGNOSIS — R918 Other nonspecific abnormal finding of lung field: Secondary | ICD-10-CM | POA: Insufficient documentation

## 2016-11-13 DIAGNOSIS — R067 Sneezing: Secondary | ICD-10-CM | POA: Diagnosis not present

## 2016-11-13 LAB — MAGNESIUM: MAGNESIUM: 1.9 mg/dL (ref 1.5–2.5)

## 2016-11-13 LAB — COMPREHENSIVE METABOLIC PANEL
ALBUMIN: 4.3 g/dL (ref 3.5–5.2)
ALT: 20 U/L (ref 0–35)
AST: 26 U/L (ref 0–37)
Alkaline Phosphatase: 65 U/L (ref 39–117)
BUN: 17 mg/dL (ref 6–23)
CALCIUM: 9.7 mg/dL (ref 8.4–10.5)
CHLORIDE: 104 meq/L (ref 96–112)
CO2: 30 mEq/L (ref 19–32)
CREATININE: 0.87 mg/dL (ref 0.40–1.20)
GFR: 68.26 mL/min (ref >60.00)
Glucose, Bld: 103 mg/dL — ABNORMAL HIGH (ref 70–99)
POTASSIUM: 3.7 meq/L (ref 3.5–5.1)
Sodium: 143 mEq/L (ref 135–145)
Total Bilirubin: 0.6 mg/dL (ref 0.2–1.2)
Total Protein: 6.5 g/dL (ref 6.0–8.3)

## 2016-11-13 LAB — H. PYLORI ANTIBODY, IGG: H PYLORI IGG: NEGATIVE

## 2016-11-13 LAB — LIPASE: LIPASE: 35 U/L (ref 11.0–59.0)

## 2016-11-13 LAB — TROPONIN I: TNIDX: 0 ug/l (ref 0.00–0.06)

## 2016-11-13 LAB — D-DIMER, QUANTITATIVE (NOT AT ARMC): D DIMER QUANT: 0.55 ug{FEU}/mL — AB (ref <0.50)

## 2016-11-13 MED ORDER — IOPAMIDOL (ISOVUE-370) INJECTION 76%
100.0000 mL | Freq: Once | INTRAVENOUS | Status: AC | PRN
  Administered 2016-11-13: 100 mL via INTRAVENOUS

## 2016-11-13 NOTE — Patient Instructions (Signed)
Please go to the lab for blood work. I will call you with your results.  You will be contacted for assessment with Neurology and Vascular Surgery. Please take 1 tablet of the Lasix twice daily over the next few days. This should continue to improve swelling. Follow a low-salt diet.  Elevate legs while resting.  Tylenol for the muscular chest discomfort. No heavy lifting or overexertion.  Continue low-spice diet. Continue acid reflux medications as directed. We are checking a few things.  If you get any recurrence of symptoms from yesterday, please go to the ER.

## 2016-11-13 NOTE — Progress Notes (Signed)
Pre visit review using our clinic review tool, if applicable. No additional management support is needed unless otherwise documented below in the visit note. 

## 2016-11-13 NOTE — Progress Notes (Signed)
Patient presents to clinic today c/o continued swelling in LLE. Patient had [place] negative for DVT last week. Lasix increased to 40 mg AM and 20 mg PM. Patient endorses taking as directed. States she notes intermittent improvement in swelling. Has noted swollen sensation in upper LLE and hip over the past day. Denies true swelling. Patient notes dry cough with episode of chest pain occurring yesterday evening and lasting 2 hours. Denies SOB during this episode though notes SOB with exertion over the past few months for which she has been set up with Cardiology for further assessment. Notes mild epigastric pain and left sternal pain since this morning that is aching in nature. Does not seem to be pleuritic. Denies fever, chills.    Past Medical History:  Diagnosis Date  . Arthritis    "spine" (07/28/2014)  . Blood dyscrasia    bruises and bleed easily  . Breast cancer, left breast ([place]) 07/28/2014  . Breast cancer, right breast ([place]) 1995  . Chronic back pain    "all over"  . Complication of anesthesia    went to sleep easily but hard to wake up up until elbow OR in 2010  . Depression   . Dyspnea    Normal Spirometry 03/2008 EF 65% BNP normal 11/2007  . Family history of breast cancer   . Family history of colon cancer   . Family history of pancreatic cancer   . Gastric polyps   . GERD (gastroesophageal reflux disease)   . History of chicken pox   . History of hiatal hernia   . Hx of adenomatous polyp of colon 07/03/2015  . Kidney stone    right kidney   . Lichen sclerosus   . Melanoma ([place]) 2010   "right elbow; treated at [facility]"  . Mild anxiety   . Vitamin B12 deficiency   . Vitamin D deficiency     Current Outpatient Prescriptions on File Prior to Visit  Medication Sig Dispense Refill  . anastrozole (ARIMIDEX) 1 MG tablet Take 1 tablet (1 mg total) by mouth daily. 90 tablet 4  . atorvastatin (LIPITOR) 10 MG tablet Take 1 tablet (10 mg total) by mouth daily. 90 tablet 1  . cetirizine  (ZYRTEC ALLERGY) 10 MG tablet Take 10 mg by mouth daily.    . Cholecalciferol (VITAMIN D3) 1000 UNITS CAPS Take 1 capsule by mouth daily.      . citalopram (CELEXA) 10 MG tablet TAKE 1 TABLET (10 MG TOTAL) BY MOUTH DAILY. 30 tablet 5  . Cyanocobalamin (VITAMIN B-12 CR) 1000 MCG TBCR Take 1 tablet by mouth daily.      . furosemide (LASIX) 40 MG tablet TAKE 1 TABLET (40 MG TOTAL) BY MOUTH DAILY. 90 tablet 1  . gabapentin (NEURONTIN) 100 MG capsule Take 1 capsule (100 mg total) by mouth at bedtime. 30 capsule 3  . OVER THE COUNTER MEDICATION Probiotic 90 billion-Take 1 tablet by mouth daily.    . pantoprazole (PROTONIX) 40 MG tablet Take 1 tablet (40 mg total) by mouth daily. 30 tablet 3  . [DISCONTINUED] sucralfate (CARAFATE) 1 GM/10ML suspension Take 1 g by mouth at bedtime as needed.       No current facility-administered medications on file prior to visit.     Allergies  Allergen Reactions  . Silodosin Rash    Facial rash  . Ace Inhibitors Other (See Comments)    REACTION: angioedema  . Buprenorphine Hcl Other (See Comments)    "crazy"  . Morphine And  Related Other (See Comments)    "crazy"  . Codeine Rash  . Sulfonamide Derivatives Rash  . Telmisartan Rash    Family History  Problem Relation Age of Onset  . Stroke Other        F 1st degree relative 58, M 1st degree relative  . Colon cancer Cousin 49       double first cousin  . Colon cancer Cousin 82       double first cousin  . Colon polyps Father        between 83-20  . Dementia Father   . Pancreatic cancer Brother 62  . Colon polyps Brother        between 10-20  . Cancer Paternal Uncle        NOS  . Anemia Paternal Grandfather        pernicious anemia  . Breast cancer Cousin 22       double first cousin  . Breast cancer Cousin 74       paternal cousin    Social History   Social History  . Marital status: Married    Spouse name: N/A  . Number of children: 2  . Years of education: N/A   Occupational  History  . Manager    Social History Main Topics  . Smoking status: Never Smoker  . Smokeless tobacco: Never Used     Comment: Regular Exercise - Yes  . Alcohol use No  . Drug use: No  . Sexual activity: Yes   Other Topics Concern  . None   Social History Narrative  . None   Review of Systems - See HPI.  All other ROS are negative.  BP 140/82   Pulse 71   Temp 98.2 F (36.8 C) (Oral)   Resp 14   Ht _0  (1.702 m)   Wt 196 lb (88.9 kg)   SpO2 98%   BMI 30.70 kg/m   Physical Exam  Constitutional: She is oriented to person, place, and time and well-developed, well-nourished, and in no distress.  HENT:  Head: Normocephalic and atraumatic.  Eyes: Conjunctivae are normal.  Neck: Neck supple.  Cardiovascular: Normal rate, regular rhythm, normal heart sounds and intact distal pulses.   Pulses:      Dorsalis pedis pulses are 2+ on the right side, and 2+ on the left side.       Posterior tibial pulses are 2+ on the right side, and 2+ on the left side.  Trace pitting edema bilaterally. Both calves now measure 15 cm today. No evidence of proximal swelling.   Pulmonary/Chest: Effort normal and breath sounds normal. No respiratory distress. She has no wheezes. She has no rales. She exhibits no tenderness.  Neurological: She is alert and oriented to person, place, and time.  Skin: Skin is warm and dry. No rash noted.  Psychiatric: Affect normal.  Vitals reviewed.   Recent Results (from the past 2160 hour(s))  CBC w/Diff     Status: None   Collection Time: 11/05/16 11:53 AM  Result Value Ref Range   WBC 6.5 4.0 - 10.5 K/uL   RBC 4.61 3.87 - 5.11 Mil/uL   Hemoglobin 14.4 12.0 - 15.0 g/dL   HCT 42.4 36.0 - 46.0 %   MCV 91.9 78.0 - 100.0 fl   MCHC 34.0 30.0 - 36.0 g/dL   RDW 13.3 11.5 - 15.5 %   Platelets 178.0 150.0 - 400.0 K/uL   Neutrophils Relative % 60.4 43.0 - 77.0 %  Lymphocytes Relative 27.1 12.0 - 46.0 %   Monocytes Relative 8.2 3.0 - 12.0 %   Eosinophils  Relative 3.4 0.0 - 5.0 %   Basophils Relative 0.9 0.0 - 3.0 %   Neutro Abs 4.0 1.4 - 7.7 K/uL   Lymphs Abs 1.8 0.7 - 4.0 K/uL   Monocytes Absolute 0.5 0.1 - 1.0 K/uL   Eosinophils Absolute 0.2 0.0 - 0.7 K/uL   Basophils Absolute 0.1 0.0 - 0.1 K/uL  Comp Met (CMET)     Status: None   Collection Time: 11/05/16 11:53 AM  Result Value Ref Range   Sodium 142 135 - 145 mEq/L   Potassium 3.9 3.5 - 5.1 mEq/L   Chloride 103 96 - 112 mEq/L   CO2 32 19 - 32 mEq/L   Glucose, Bld 95 70 - 99 mg/dL   BUN 17 6 - 23 mg/dL   Creatinine, Ser 0.96 0.40 - 1.20 mg/dL   Total Bilirubin 0.6 0.2 - 1.2 mg/dL   Alkaline Phosphatase 69 39 - 117 U/L   AST 24 0 - 37 U/L   ALT 20 0 - 35 U/L   Total Protein 7.0 6.0 - 8.3 g/dL   Albumin 4.6 3.5 - 5.2 g/dL   Calcium 9.9 8.4 - 10.5 mg/dL   GFR 60.93 >60.00 mL/min  TSH     Status: None   Collection Time: 11/05/16 11:53 AM  Result Value Ref Range   TSH 2.19 0.35 - 4.50 uIU/mL  B12     Status: Abnormal   Collection Time: 11/05/16 11:53 AM  Result Value Ref Range   Vitamin B-12 >1500 (H) 211 - 911 pg/mL  VITAMIN D 25 Hydroxy (Vit-D Deficiency, Fractures)     Status: None   Collection Time: 11/05/16 11:53 AM  Result Value Ref Range   VITD 54.11 30.00 - 100.00 ng/mL    Assessment/Plan: 1. Atypical chest pain EKG with NSR. Negative STAT troponin. Mild elevation of D-dimer. Lab panel today. Will obtain STAT CTA as outpatient to r/o clot giving stable vitals and only minimal elevation in d-dimer. Strict ER precsutions reviewed with patient.  - EKG 12-Lead - Comp Met (CMET) - Lipase - H. pylori antibody, IgG - Troponin I - D-Dimer, Quantitative  3. Neuropathy Continue medication as directed. Will check CMP and magnesium. Referral to Neurology as directed.  - Ambulatory referral to Neurology - Magnesium   Leeanne Rio, PA-C

## 2016-11-14 ENCOUNTER — Encounter: Payer: Self-pay | Admitting: Neurology

## 2016-11-15 ENCOUNTER — Encounter: Payer: Self-pay | Admitting: Physician Assistant

## 2016-11-25 ENCOUNTER — Encounter: Payer: Self-pay | Admitting: Physician Assistant

## 2016-11-25 ENCOUNTER — Ambulatory Visit (INDEPENDENT_AMBULATORY_CARE_PROVIDER_SITE_OTHER): Payer: Medicare HMO | Admitting: Physician Assistant

## 2016-11-25 VITALS — BP 132/82 | HR 82 | Temp 98.4°F | Resp 14 | Ht 67.0 in | Wt 198.0 lb

## 2016-11-25 DIAGNOSIS — R609 Edema, unspecified: Secondary | ICD-10-CM

## 2016-11-25 DIAGNOSIS — G629 Polyneuropathy, unspecified: Secondary | ICD-10-CM

## 2016-11-25 LAB — TSH: TSH: 2.44 u[IU]/mL (ref 0.35–4.50)

## 2016-11-25 LAB — COMPREHENSIVE METABOLIC PANEL
ALT: 28 U/L (ref 0–35)
AST: 30 U/L (ref 0–37)
Albumin: 4.4 g/dL (ref 3.5–5.2)
Alkaline Phosphatase: 64 U/L (ref 39–117)
BUN: 15 mg/dL (ref 6–23)
CHLORIDE: 103 meq/L (ref 96–112)
CO2: 31 meq/L (ref 19–32)
Calcium: 9.6 mg/dL (ref 8.4–10.5)
Creatinine, Ser: 0.88 mg/dL (ref 0.40–1.20)
GFR: 67.36 mL/min (ref >60.00)
GLUCOSE: 100 mg/dL — AB (ref 70–99)
POTASSIUM: 3.7 meq/L (ref 3.5–5.1)
Sodium: 143 mEq/L (ref 135–145)
Total Bilirubin: 0.6 mg/dL (ref 0.2–1.2)
Total Protein: 7.2 g/dL (ref 6.0–8.3)

## 2016-11-25 LAB — IBC PANEL
Iron: 155 ug/dL — ABNORMAL HIGH (ref 42–145)
SATURATION RATIOS: 39 % (ref 20.0–50.0)
Transferrin: 284 mg/dL (ref 212.0–360.0)

## 2016-11-25 NOTE — Patient Instructions (Signed)
Please go to the lab for blood work. I will call you with your results.  Continue chronic medications as directed with the following exception:   - Increase Gabapentin to 100 mg in the morning and 300 mg in the evening.  Follow-up with me in 2 weeks via the phone to let me know how you are doing on this regimen.  Please follow-up with Cardiology and other specialists as directed.

## 2016-11-25 NOTE — Progress Notes (Signed)
Patient presents to clinic today for follow-up. At last visit patient's regimen of Lasix was increased for peripheral edema. A further workup was done for her SOB on exertion and chronic cough, including a d-dimer that was slightly positive. CTA performed and negative for PE. Patient has Cardiology appointment pending for assessment, echocardiogram and further assessment. Patient endorses using her Lasix as directed with improvement in her swelling. Still having a swollen sensation and now noting worsening of peripheral neuropathy symptoms -- notes increased burning and tingling in hands and feet bilaterally. Denies history of iron deficiency. Has history of b12 deficiency, currently on supplement with good numbers. Patient without history of diabetes.   Past Medical History:  Diagnosis Date  . Arthritis    "spine" (07/28/2014)  . Blood dyscrasia    bruises and bleed easily  . Breast cancer, left breast ([place]) 07/28/2014  . Breast cancer, right breast ([place]) 1995  . Chronic back pain    "all over"  . Complication of anesthesia    went to sleep easily but hard to wake up up until elbow OR in 2010  . Depression   . Dyspnea    Normal Spirometry 03/2008 EF 65% BNP normal 11/2007  . Family history of breast cancer   . Family history of colon cancer   . Family history of pancreatic cancer   . Gastric polyps   . GERD (gastroesophageal reflux disease)   . History of chicken pox   . History of hiatal hernia   . Hx of adenomatous polyp of colon 07/03/2015  . Kidney stone    right kidney   . Lichen sclerosus   . Melanoma ([place]) 2010   "right elbow; treated at [facility]"  . Mild anxiety   . Vitamin B12 deficiency   . Vitamin D deficiency     Current Outpatient Prescriptions on File Prior to Visit  Medication Sig Dispense Refill  . anastrozole (ARIMIDEX) 1 MG tablet Take 1 tablet (1 mg total) by mouth daily. 90 tablet 4  . atorvastatin (LIPITOR) 10 MG tablet Take 1 tablet (10 mg total) by mouth daily.  90 tablet 1  . cetirizine (ZYRTEC ALLERGY) 10 MG tablet Take 10 mg by mouth daily.    . Cholecalciferol (VITAMIN D3) 1000 UNITS CAPS Take 1 capsule by mouth daily.      . citalopram (CELEXA) 10 MG tablet TAKE 1 TABLET (10 MG TOTAL) BY MOUTH DAILY. 30 tablet 5  . Cyanocobalamin (VITAMIN B-12 CR) 1000 MCG TBCR Take 1 tablet by mouth daily.      . furosemide (LASIX) 40 MG tablet TAKE 1 TABLET (40 MG TOTAL) BY MOUTH DAILY. (Patient taking differently: Take 40 mg by mouth 2 (two) times daily. TAKE 1 TABLET (40 MG TOTAL) BY MOUTH DAILY.) 90 tablet 1  . gabapentin (NEURONTIN) 100 MG capsule Take 1 capsule (100 mg total) by mouth at bedtime. 30 capsule 3  . OVER THE COUNTER MEDICATION Probiotic 90 billion-Take 1 tablet by mouth daily.    . pantoprazole (PROTONIX) 40 MG tablet Take 1 tablet (40 mg total) by mouth daily. 30 tablet 3  . [DISCONTINUED] sucralfate (CARAFATE) 1 GM/10ML suspension Take 1 g by mouth at bedtime as needed.       No current facility-administered medications on file prior to visit.     Allergies  Allergen Reactions  . Silodosin Rash    Facial rash  . Ace Inhibitors Other (See Comments)    REACTION: angioedema  . Buprenorphine Hcl Other (See  Comments)    "crazy"  . Morphine And Related Other (See Comments)    "crazy"  . Codeine Rash  . Sulfonamide Derivatives Rash  . Telmisartan Rash    Family History  Problem Relation Age of Onset  . Colon cancer Cousin 69       double first cousin  . Colon cancer Cousin 75       double first cousin  . Colon polyps Father        between 10-20  . Dementia Father   . Pancreatic cancer Brother 78  . Colon polyps Brother        between 10-20  . Cancer Paternal Uncle        NOS  . Anemia Paternal Grandfather        pernicious anemia  . Breast cancer Cousin 75       double first cousin  . Breast cancer Cousin 74       paternal cousin  . Stroke Other        F 1st degree relative 92, M 1st degree relative    Social History     Social History  . Marital status: Married    Spouse name: N/A  . Number of children: 2  . Years of education: N/A   Occupational History  . Manager    Social History Main Topics  . Smoking status: Never Smoker  . Smokeless tobacco: Never Used     Comment: Regular Exercise - Yes  . Alcohol use No  . Drug use: No  . Sexual activity: Yes   Other Topics Concern  . None   Social History Narrative  . None   Review of Systems - See HPI.  All other ROS are negative.  BP 132/82   Pulse 82   Temp 98.4 F (36.9 C) (Oral)   Resp 14   Ht 5' 7" (1.702 m)   Wt 198 lb (89.8 kg)   SpO2 98%   BMI 31.01 kg/m   Physical Exam  Constitutional: She is oriented to person, place, and time and well-developed, well-nourished, and in no distress.  HENT:  Head: Normocephalic and atraumatic.  Eyes: Conjunctivae are normal.  Cardiovascular: Normal rate, regular rhythm, normal heart sounds and intact distal pulses.   Pulses:      Dorsalis pedis pulses are 2+ on the right side, and 2+ on the left side.       Posterior tibial pulses are 2+ on the right side, and 2+ on the left side.  No evidence of pitting edema on examination  Pulmonary/Chest: Effort normal and breath sounds normal. No respiratory distress. She has no wheezes. She has no rales. She exhibits no tenderness.  Neurological: She is alert and oriented to person, place, and time.  Skin: Skin is warm and dry. No rash noted.  Psychiatric: Affect normal.  Vitals reviewed.  Recent Results (from the past 2160 hour(s))  CBC w/Diff     Status: None   Collection Time: 11/05/16 11:53 AM  Result Value Ref Range   WBC 6.5 4.0 - 10.5 K/uL   RBC 4.61 3.87 - 5.11 Mil/uL   Hemoglobin 14.4 12.0 - 15.0 g/dL   HCT 42.4 36.0 - 46.0 %   MCV 91.9 78.0 - 100.0 fl   MCHC 34.0 30.0 - 36.0 g/dL   RDW 13.3 11.5 - 15.5 %   Platelets 178.0 150.0 - 400.0 K/uL   Neutrophils Relative % 60.4 43.0 - 77.0 %   Lymphocytes Relative 27.1   12.0 - 46.0 %    Monocytes Relative 8.2 3.0 - 12.0 %   Eosinophils Relative 3.4 0.0 - 5.0 %   Basophils Relative 0.9 0.0 - 3.0 %   Neutro Abs 4.0 1.4 - 7.7 K/uL   Lymphs Abs 1.8 0.7 - 4.0 K/uL   Monocytes Absolute 0.5 0.1 - 1.0 K/uL   Eosinophils Absolute 0.2 0.0 - 0.7 K/uL   Basophils Absolute 0.1 0.0 - 0.1 K/uL  Comp Met (CMET)     Status: None   Collection Time: 11/05/16 11:53 AM  Result Value Ref Range   Sodium 142 135 - 145 mEq/L   Potassium 3.9 3.5 - 5.1 mEq/L   Chloride 103 96 - 112 mEq/L   CO2 32 19 - 32 mEq/L   Glucose, Bld 95 70 - 99 mg/dL   BUN 17 6 - 23 mg/dL   Creatinine, Ser 0.96 0.40 - 1.20 mg/dL   Total Bilirubin 0.6 0.2 - 1.2 mg/dL   Alkaline Phosphatase 69 39 - 117 U/L   AST 24 0 - 37 U/L   ALT 20 0 - 35 U/L   Total Protein 7.0 6.0 - 8.3 g/dL   Albumin 4.6 3.5 - 5.2 g/dL   Calcium 9.9 8.4 - 10.5 mg/dL   GFR 60.93 >60.00 mL/min  TSH     Status: None   Collection Time: 11/05/16 11:53 AM  Result Value Ref Range   TSH 2.19 0.35 - 4.50 uIU/mL  B12     Status: Abnormal   Collection Time: 11/05/16 11:53 AM  Result Value Ref Range   Vitamin B-12 >1500 (H) 211 - 911 pg/mL  VITAMIN D 25 Hydroxy (Vit-D Deficiency, Fractures)     Status: None   Collection Time: 11/05/16 11:53 AM  Result Value Ref Range   VITD 54.11 30.00 - 100.00 ng/mL  Comp Met (CMET)     Status: Abnormal   Collection Time: 11/13/16  8:57 AM  Result Value Ref Range   Sodium 143 135 - 145 mEq/L   Potassium 3.7 3.5 - 5.1 mEq/L   Chloride 104 96 - 112 mEq/L   CO2 30 19 - 32 mEq/L   Glucose, Bld 103 (H) 70 - 99 mg/dL   BUN 17 6 - 23 mg/dL   Creatinine, Ser 0.87 0.40 - 1.20 mg/dL   Total Bilirubin 0.6 0.2 - 1.2 mg/dL   Alkaline Phosphatase 65 39 - 117 U/L   AST 26 0 - 37 U/L   ALT 20 0 - 35 U/L   Total Protein 6.5 6.0 - 8.3 g/dL   Albumin 4.3 3.5 - 5.2 g/dL   Calcium 9.7 8.4 - 10.5 mg/dL   GFR 68.26 >60.00 mL/min  Lipase     Status: None   Collection Time: 11/13/16  8:57 AM  Result Value Ref Range   Lipase  35.0 11.0 - 59.0 U/L  H. pylori antibody, IgG     Status: None   Collection Time: 11/13/16  8:57 AM  Result Value Ref Range   H Pylori IgG Negative Negative  Magnesium     Status: None   Collection Time: 11/13/16  8:57 AM  Result Value Ref Range   Magnesium 1.9 1.5 - 2.5 mg/dL  Troponin I     Status: None   Collection Time: 11/13/16  8:57 AM  Result Value Ref Range   TNIDX 0.00 0.00 - 0.06 ug/l  D-Dimer, Quantitative     Status: Abnormal   Collection Time: 11/13/16  8:57 AM  Result  Value Ref Range   D-Dimer, Quant 0.55 (H) <0.50 mcg/mL FEU    Comment:   The D-Dimer test is used frequently to exclude an acute PE or DVT.  In patients with a low to moderate clinical risk assessment and a D-Dimer result <0.50 mcg/mL FEU, the likelihood of a PE or DVT is very low.  However, a thromboembolic event should not be excluded solely on the basis of the D-Dimer level.  Increased levels of D-Dimer are associated with a PE, DVT, DIC, malignancies, inflammation, sepsis, surgery, trauma, pregnancy, and advancing patient age. [Jama 2006 11:295(2): 199-207]   For additional information, please refer to: http://education.questdiagnostics.com/faq/FAQ149 (This link is being provided for information/ educational purposes only)     Comp Met (CMET)     Status: Abnormal   Collection Time: 11/25/16  9:57 AM  Result Value Ref Range   Sodium 143 135 - 145 mEq/L   Potassium 3.7 3.5 - 5.1 mEq/L   Chloride 103 96 - 112 mEq/L   CO2 31 19 - 32 mEq/L   Glucose, Bld 100 (H) 70 - 99 mg/dL   BUN 15 6 - 23 mg/dL   Creatinine, Ser 0.88 0.40 - 1.20 mg/dL   Total Bilirubin 0.6 0.2 - 1.2 mg/dL   Alkaline Phosphatase 64 39 - 117 U/L   AST 30 0 - 37 U/L   ALT 28 0 - 35 U/L   Total Protein 7.2 6.0 - 8.3 g/dL   Albumin 4.4 3.5 - 5.2 g/dL   Calcium 9.6 8.4 - 10.5 mg/dL   GFR 67.36 >60.00 mL/min  IBC panel     Status: Abnormal   Collection Time: 11/25/16  9:57 AM  Result Value Ref Range   Iron 155 (H) 42 -  145 ug/dL   Transferrin 284.0 212.0 - 360.0 mg/dL   Saturation Ratios 39.0 20.0 - 50.0 %  TSH     Status: None   Collection Time: 11/25/16  9:57 AM  Result Value Ref Range   TSH 2.44 0.35 - 4.50 uIU/mL   Assessment/Plan: Peripheral neuropathy Repeat labs today to further assess. Will increase Gabapentin to 100 mg AM and 300 mg QHS. Will follow-up via phone to see how she is doing and further titrate Gabapentin to therapeutic response. Will alter regimen based on results.  Peripheral edema Much improved. Continue Lasix 40 mg BID for now. Will reassess at follow-up. Repeat labs today.    ,  [place], PA-C 

## 2016-11-25 NOTE — Progress Notes (Signed)
Pre visit review using our clinic review tool, if applicable. No additional management support is needed unless otherwise documented below in the visit note. 

## 2016-11-26 DIAGNOSIS — R609 Edema, unspecified: Secondary | ICD-10-CM

## 2016-11-26 DIAGNOSIS — R600 Localized edema: Secondary | ICD-10-CM

## 2016-11-26 HISTORY — DX: Localized edema: R60.0

## 2016-11-26 HISTORY — DX: Edema, unspecified: R60.9

## 2016-11-26 NOTE — Assessment & Plan Note (Signed)
Repeat labs today to further assess. Will increase Gabapentin to 100 mg AM and 300 mg QHS. Will follow-up via phone to see how she is doing and further titrate Gabapentin to therapeutic response. Will alter regimen based on results.

## 2016-11-26 NOTE — Assessment & Plan Note (Signed)
Much improved. Continue Lasix 40 mg BID for now. Will reassess at follow-up. Repeat labs today.

## 2016-12-02 NOTE — Progress Notes (Signed)
Cardiology Office Note:    Date:  12/03/2016   ID:  [name], DOB May 03, 1946, MRN [id number]  PCP:  Brunetta Jeans, PA-C  Cardiologist:  Dr. Johnsie Cancel  Referring MD: Brunetta Jeans, PA-C   Chief Complaint  Patient presents with  . Shortness of Breath    History of Present Illness:    [name] is a 70 y.o. female with a past medical history significant for breast cancer (XRT and chemo), chronic back pain, hyperlipidemia, depression, GERD, melanoma who presents for evaluation of shortness of breath on exertion and recent peripheral edema improved with lasix.   She had a mildly positive d-dimer but CTA was negative for PE. LLE negative for DVT on 11/05/16. She's been having worsened peripheral neuropathy and gabapentirn was increased. No history of diabetes. Labs done at her PCP appt were essentially normal. She was referred here for further evaluation of shortness of breath. Pt had a negative cardiac workup many years ago with Dr. Rollene Fare.  Pt was last seen in 05/2014 by Dr. Johnsie Cancel with atypical chest pain thought to be related to mammogram and biopsy. She was cleared for surgery at that time.   She presents alone today. She reports that she has discomfort from her neck down related to joint pain and neuropathy. She has specific discomfort from her elbows down with difficulty writing. When questioned about chest discomfort she admits to intermittent chest pressure that occasionally goes up to her neck and usually occurs about 30 minutes into working in her house or yard when she suddenly feels exhausted. This is accompanied by shortness of breath, diaphoresis, lightheadedness and occ nausea, lasting about 5 minutes and relieved with rest. This occurs as often as twice a day. She also has been experiencing edema of the left leg. It is mild today, but she reports has been much worse. She denies orthopnea or PND. She has no history of cardiac issues.   She had some mild chest  pressure while in the office and an EKG was done showing no ischemic changes.   Past Medical History:  Diagnosis Date  . Arthritis    "spine" (07/28/2014)  . Blood dyscrasia    bruises and bleed easily  . Breast cancer, left breast ([place]) 07/28/2014  . Breast cancer, right breast ([place]) 1995  . Chronic back pain    "all over"  . Complication of anesthesia    went to sleep easily but hard to wake up up until elbow OR in 2010  . Depression   . Dyspnea    Normal Spirometry 03/2008 EF 65% BNP normal 11/2007  . Family history of breast cancer   . Family history of colon cancer   . Family history of pancreatic cancer   . Gastric polyps   . GERD (gastroesophageal reflux disease)   . History of chicken pox   . History of hiatal hernia   . Hx of adenomatous polyp of colon 07/03/2015  . Kidney stone    right kidney   . Lichen sclerosus   . Melanoma ([place]) 2010   "right elbow; treated at [facility]"  . Mild anxiety   . Vitamin B12 deficiency   . Vitamin D deficiency     Past Surgical History:  Procedure Laterality Date  . BREAST BIOPSY Left 04/2014  . BREAST RECONSTRUCTION WITH PLACEMENT OF TISSUE EXPANDER AND FLEX HD (ACELLULAR HYDRATED DERMIS) Left 07/28/2014   Procedure: LEFT BREAST RECONSTRUCTION PLACEMENT OF LEFT TISSUE EXPANDER ;  Surgeon: Crissie Reese,  MD;  Location: [place];  Service: Plastics;  Laterality: Left;  . BREAST RECONSTRUCTION WITH PLACEMENT OF TISSUE EXPANDER AND FLEX HD (ACELLULAR HYDRATED DERMIS) Left 09/08/2014   Procedure: REMOVAL OF TISSUE EXPANDER FROM LEFT BREAST;  Surgeon: Crissie Reese, MD;  Location: [place];  Service: Plastics;  Laterality: Left;  . BUNIONECTOMY Bilateral 1970's  . COLONOSCOPY     Dr Sharlett Iles  . CYSTOSCOPY WITH RETROGRADE PYELOGRAM, URETEROSCOPY AND STENT PLACEMENT Right 06/09/2015   Procedure: CYSTOSCOPY WITH RIGHT RETROGRADE PYELOGRAM, RIGHT URETEROSCOPY AND RIGHT URTERAL STENT PLACEMENT;  Surgeon: Ardis Hughs, MD;  Location: [facility];  Service:  Urology;  Laterality: Right;  . ESOPHAGOGASTRODUODENOSCOPY    . HERNIA REPAIR    . HOLMIUM LASER APPLICATION Right 4/76/5465   Procedure: HOLMIUM LASER APPLICATION;  Surgeon: Ardis Hughs, MD;  Location: [facility];  Service: Urology;  Laterality: Right;  . LATISSIMUS FLAP TO BREAST Left 09/08/2014   Procedure: LEFT LATISSIMUS FLAP TO BREAST WITH SALINE IMPLANT FOR BREAST RECONSTRUCTION;  Surgeon: Crissie Reese, MD;  Location: [place];  Service: Plastics;  Laterality: Left;  Marland Kitchen MASTECTOMY Right 1996    chemotherapy. pt. states 13 lymph nodes were removed  . MASTECTOMY COMPLETE / SIMPLE W/ SENTINEL NODE BIOPSY Left 07/28/2014  . MASTECTOMY W/ SENTINEL NODE BIOPSY Left 07/28/2014   Procedure: LEFT MASTECTOMY WITH SENTINEL LYMPH NODE MAPPING;  Surgeon: Autumn Messing III, MD;  Location: [place];  Service: General;  Laterality: Left;  Marland Kitchen MELANOMA EXCISION Right 2010   From elbow-- Done at [facility]   . NISSEN FUNDOPLICATION  07/5463  . RECONSTRUCTION BREAST IMMEDIATE / DELAYED W/ TISSUE EXPANDER Left 07/28/2014  . TEMPOROMANDIBULAR JOINT SURGERY Bilateral 1987  . TONSILLECTOMY      Current Medications: Current Meds  Medication Sig  . anastrozole (ARIMIDEX) 1 MG tablet Take 1 tablet (1 mg total) by mouth daily.  Marland Kitchen atorvastatin (LIPITOR) 10 MG tablet Take 1 tablet (10 mg total) by mouth daily.  . cetirizine (ZYRTEC ALLERGY) 10 MG tablet Take 10 mg by mouth daily.  . Cholecalciferol (VITAMIN D3) 1000 UNITS CAPS Take 1 capsule by mouth daily.    . citalopram (CELEXA) 10 MG tablet TAKE 1 TABLET (10 MG TOTAL) BY MOUTH DAILY.  Marland Kitchen Cyanocobalamin (VITAMIN B-12 CR) 1000 MCG TBCR Take 1 tablet by mouth daily.    . furosemide (LASIX) 40 MG tablet Take 60 mg by mouth daily.  Marland Kitchen gabapentin (NEURONTIN) 100 MG capsule Take 1 capsule (100 mg total) by mouth at bedtime.  Marland Kitchen OVER THE COUNTER MEDICATION Probiotic 90 billion-Take 1 tablet by mouth daily.  . pantoprazole (PROTONIX) 40 MG tablet Take 1 tablet (40 mg total) by mouth  daily.  . [DISCONTINUED] furosemide (LASIX) 40 MG tablet TAKE 1 TABLET (40 MG TOTAL) BY MOUTH DAILY. (Patient taking differently: Take 40 mg by mouth 2 (two) times daily. TAKE 1 TABLET (40 MG TOTAL) BY MOUTH DAILY.)     Allergies:   Silodosin; Ace inhibitors; Buprenorphine hcl; Morphine and related; Codeine; Sulfonamide derivatives; and Telmisartan   Social History   Social History  . Marital status: Married    Spouse name: N/A  . Number of children: 2  . Years of education: N/A   Occupational History  . Manager    Social History Main Topics  . Smoking status: Never Smoker  . Smokeless tobacco: Never Used     Comment: Regular Exercise - Yes  . Alcohol use No  . Drug use: No  . Sexual activity: Yes  Other Topics Concern  . None   Social History Narrative  . None     Family History: The patient's family history includes Anemia in her paternal grandfather; Breast cancer (age of onset: 66) in her cousin; Breast cancer (age of onset: 68) in her cousin; CAD in her father; Cancer in her paternal uncle; Colon cancer (age of onset: 4) in her cousin; Colon cancer (age of onset: 68) in her cousin; Colon polyps in her brother and father; Dementia in her father; Pancreatic cancer (age of onset: 64) in her brother; Stroke in her mother and other. ROS:   Please see the history of present illness.     All other systems reviewed and are negative.  EKGs/Labs/Other Studies Reviewed:    The following studies were reviewed today: None available  EKG:  EKG is  ordered today.  The ekg ordered today demonstrates NSR at 71 bpm with no ischemic changes  Recent Labs: 11/05/2016: Hemoglobin 14.4; Platelets 178.0 11/13/2016: Magnesium 1.9 11/25/2016: ALT 28; BUN 15; Creatinine, Ser 0.88; Potassium 3.7; Sodium 143; TSH 2.44   Recent Lipid Panel    Component Value Date/Time   CHOL 144 08/02/2016 0925   TRIG 102.0 08/02/2016 0925   HDL 46.80 08/02/2016 0925   CHOLHDL 3 08/02/2016 0925   VLDL  20.4 08/02/2016 0925   LDLCALC 77 08/02/2016 0925   LDLDIRECT 179.7 02/10/2012 0920    Physical Exam:    VS:  BP 130/80   Pulse 74   Ht 5\' 7"  (1.702 m)   Wt 195 lb 12.8 oz (88.8 kg)   LMP  (LMP Unknown)   BMI 30.67 kg/m     Wt Readings from Last 3 Encounters:  12/03/16 195 lb 12.8 oz (88.8 kg)  11/25/16 198 lb (89.8 kg)  11/13/16 196 lb (88.9 kg)     GEN:  Well nourished, well developed in no acute distress HEENT: Normal NECK: No JVD; No carotid bruits LYMPHATICS: No lymphadenopathy CARDIAC: RRR, no murmurs, rubs, gallops RESPIRATORY:  Clear to auscultation without rales, wheezing or rhonchi  ABDOMEN: Soft, non-tender, non-distended MUSCULOSKELETAL:  Trace puffy edema of left leg; No deformity  SKIN: Warm and dry NEUROLOGIC:  Alert and oriented x 3 PSYCHIATRIC:  Normal affect   ASSESSMENT:    1. Shortness of breath on exertion   2. Essential (primary) hypertension   3. Chest pain, unspecified type    PLAN:    In order of problems listed above:  Chest pain -Pt with no history of cardiac issues. Having Intermittent chest pressure with associated shortness of breath, diaphoresis, lightheadedness after about 30 minutes of house work and relieved with rest.  -CVD risk factors include hyperlipidemia and obesity. -Will check nuclear stress test   Shortness of breath -With activity. Ruled out for PE at her PCP. Worse with chest pressure as above. Will check BNP to assess for volume overload although she does not appear to be volume overloaded. EF will be estimated with stress test.   Hyperlipidemia -LDL was 149 on 06/27/16. atorvastatin 10 mg was started and LDL down to 77 on 08/02/16. Continue statin   Medication Adjustments/Labs and Tests Ordered: Current medicines are reviewed at length with the patient today.  Concerns regarding medicines are outlined above. Labs and tests ordered and medication changes are outlined in the patient instructions below:  Patient  Instructions  Medication Instructions: Your physician recommends that you continue on your current medications as directed. Please refer to the Current Medication list given to you today.  Labwork: Your physician recommends that you have lab work today: BNP   Procedures/Testing: Your physician has requested that you have an exercise tolerance test. For further information please visit [email or web address]. Please also follow instruction sheet, as given.   Follow-Up: Your physician recommends that you schedule a follow-up appointment in 3 MONTHS with Dr. Johnsie Cancel.   If you need a refill on your cardiac medications before your next appointment, please call your pharmacy.      Signed, Daune Perch, NP  12/03/2016 9:29 AM    [facility]

## 2016-12-03 ENCOUNTER — Ambulatory Visit (INDEPENDENT_AMBULATORY_CARE_PROVIDER_SITE_OTHER): Payer: Medicare HMO | Admitting: Cardiology

## 2016-12-03 ENCOUNTER — Encounter: Payer: Self-pay | Admitting: Physician Assistant

## 2016-12-03 VITALS — BP 130/80 | HR 74 | Ht 67.0 in | Wt 195.8 lb

## 2016-12-03 DIAGNOSIS — E785 Hyperlipidemia, unspecified: Secondary | ICD-10-CM | POA: Diagnosis not present

## 2016-12-03 DIAGNOSIS — I10 Essential (primary) hypertension: Secondary | ICD-10-CM | POA: Diagnosis not present

## 2016-12-03 DIAGNOSIS — R0602 Shortness of breath: Secondary | ICD-10-CM | POA: Diagnosis not present

## 2016-12-03 DIAGNOSIS — R079 Chest pain, unspecified: Secondary | ICD-10-CM | POA: Diagnosis not present

## 2016-12-03 LAB — PRO B NATRIURETIC PEPTIDE: NT-Pro BNP: 62 pg/mL (ref 0–301)

## 2016-12-03 NOTE — Patient Instructions (Signed)
Medication Instructions: Your physician recommends that you continue on your current medications as directed. Please refer to the Current Medication list given to you today.   Labwork: Your physician recommends that you have lab work today: BNP   Procedures/Testing: Your physician has requested that you have an exercise tolerance test. For further information please visit [email or web address]. Please also follow instruction sheet, as given.   Follow-Up: Your physician recommends that you schedule a follow-up appointment in 3 MONTHS with Dr. Johnsie Cancel.   If you need a refill on your cardiac medications before your next appointment, please call your pharmacy.

## 2016-12-04 ENCOUNTER — Ambulatory Visit: Payer: Medicare HMO | Admitting: Physician Assistant

## 2016-12-04 ENCOUNTER — Telehealth: Payer: Self-pay | Admitting: *Deleted

## 2016-12-04 NOTE — Addendum Note (Signed)
Addended by: Campbell Riches on: 12/04/2016 11:48 AM   Modules accepted: Orders

## 2016-12-04 NOTE — Telephone Encounter (Signed)
Pt has been notified of lab results by phone with verbal understanding. Pt thanked me for my call today.   

## 2016-12-04 NOTE — Telephone Encounter (Signed)
-----   Message from Daune Perch, NP sent at 12/03/2016  4:26 PM EDT ----- Please let the pt know that the lab test we did today, BNP, was normal indicating that she does not have fluid overload that is related to her heart.   Daune Perch, NP

## 2016-12-11 ENCOUNTER — Telehealth: Payer: Self-pay | Admitting: Physician Assistant

## 2016-12-11 NOTE — Telephone Encounter (Signed)
Patient returned Cody's call.

## 2016-12-11 NOTE — Telephone Encounter (Signed)
Called patient to assess how she is doing on new dose of Gabapentin. No answer. LMOVM for callback.

## 2016-12-12 ENCOUNTER — Encounter: Payer: Self-pay | Admitting: Physician Assistant

## 2016-12-12 NOTE — Addendum Note (Signed)
Addended by: Brunetta Jeans on: 12/12/2016 09:05 AM   Modules accepted: Orders

## 2016-12-12 NOTE — Telephone Encounter (Signed)
Spoke with patient. She could not tolerate higher dose due to edema it caused. Stopped medication completely with complete resolution in swelling. Noted significant increase in pain. Has just restarted at 100 mg dose and wants to try for a few days before attempting to switch to another medication. She is to call us in a few days to let us know how she is doing.

## 2016-12-16 ENCOUNTER — Telehealth: Payer: Self-pay | Admitting: Cardiovascular Disease

## 2016-12-16 NOTE — Telephone Encounter (Signed)
Left message for patient to call back  

## 2016-12-16 NOTE — Telephone Encounter (Signed)
New Message  Pt call requesting to speak with RN. Pt states her granddaughter was diagnosed with pots and she would like to inform Dr. Johnsie Cancel of these new finding that my correlate to her medical history. Please call back to discuss

## 2016-12-16 NOTE — Telephone Encounter (Signed)
Called patient about her call. Patient wanted to make sure [name] Perch NP was aware of her granddaughter having celiac artery stenosis and POTS. Patient's PCP wanted to make sure her cardiologist was aware of this before she had her stress test. Patient stated she was told that her granddaughter's condition in hereditary and she should make [place] aware of this. Will forward to [name] since patient saw her last and her stress test is scheduled on Thursday. Patient verbalized understanding.

## 2016-12-16 NOTE — Telephone Encounter (Signed)
Follow Up: ° ° ° °Returning your call from this morning. °

## 2016-12-18 ENCOUNTER — Other Ambulatory Visit: Payer: Self-pay | Admitting: *Deleted

## 2016-12-18 DIAGNOSIS — R0602 Shortness of breath: Secondary | ICD-10-CM

## 2016-12-18 DIAGNOSIS — R079 Chest pain, unspecified: Secondary | ICD-10-CM

## 2016-12-18 NOTE — Telephone Encounter (Signed)
I am covering Nina's box this week, will leave this for her review.  Dayna Dunn PA-C

## 2016-12-23 ENCOUNTER — Telehealth (HOSPITAL_COMMUNITY): Payer: Self-pay | Admitting: *Deleted

## 2016-12-23 NOTE — Telephone Encounter (Signed)
Patient given detailed instructions per Myocardial Perfusion Study Information Sheet for the test on 12/25/16. Patient notified to arrive 15 minutes early and that it is imperative to arrive on time for appointment to keep from having the test rescheduled.  If you need to cancel or reschedule your appointment, please call the office within 24 hours of your appointment. . Patient verbalized understanding. Alessandro Griep Jacqueline    

## 2016-12-24 NOTE — Telephone Encounter (Signed)
Called patient about [name] Perch NP's response. Patient verbalized understanding and will have her stress test done tomorrow.

## 2016-12-24 NOTE — Telephone Encounter (Signed)
This should not be a factor in a pharmacologic stress test. This can be discussed at follow up.

## 2016-12-25 ENCOUNTER — Ambulatory Visit (HOSPITAL_COMMUNITY): Payer: Medicare HMO | Attending: Cardiology

## 2016-12-25 DIAGNOSIS — Z6830 Body mass index (BMI) 30.0-30.9, adult: Secondary | ICD-10-CM | POA: Insufficient documentation

## 2016-12-25 DIAGNOSIS — R5383 Other fatigue: Secondary | ICD-10-CM | POA: Diagnosis not present

## 2016-12-25 DIAGNOSIS — E785 Hyperlipidemia, unspecified: Secondary | ICD-10-CM | POA: Diagnosis not present

## 2016-12-25 DIAGNOSIS — R0602 Shortness of breath: Secondary | ICD-10-CM | POA: Diagnosis not present

## 2016-12-25 DIAGNOSIS — R079 Chest pain, unspecified: Secondary | ICD-10-CM

## 2016-12-25 DIAGNOSIS — E669 Obesity, unspecified: Secondary | ICD-10-CM | POA: Diagnosis not present

## 2016-12-25 DIAGNOSIS — R42 Dizziness and giddiness: Secondary | ICD-10-CM | POA: Diagnosis not present

## 2016-12-25 LAB — MYOCARDIAL PERFUSION IMAGING
CHL CUP NUCLEAR SDS: 2
CHL CUP NUCLEAR SSS: 2
LHR: 0.21
LV dias vol: 89 mL (ref 46–106)
LV sys vol: 28 mL
NUC STRESS TID: 0.79
Peak HR: 87 {beats}/min
Rest HR: 55 {beats}/min
SRS: 0

## 2016-12-25 MED ORDER — TECHNETIUM TC 99M TETROFOSMIN IV KIT
10.8000 | PACK | Freq: Once | INTRAVENOUS | Status: AC | PRN
  Administered 2016-12-25: 10.8 via INTRAVENOUS
  Filled 2016-12-25: qty 11

## 2016-12-25 MED ORDER — TECHNETIUM TC 99M TETROFOSMIN IV KIT
31.4000 | PACK | Freq: Once | INTRAVENOUS | Status: AC | PRN
  Administered 2016-12-25: 31.4 via INTRAVENOUS
  Filled 2016-12-25: qty 32

## 2016-12-25 MED ORDER — REGADENOSON 0.4 MG/5ML IV SOLN
0.4000 mg | Freq: Once | INTRAVENOUS | Status: AC
  Administered 2016-12-25: 0.4 mg via INTRAVENOUS

## 2017-01-13 ENCOUNTER — Ambulatory Visit (INDEPENDENT_AMBULATORY_CARE_PROVIDER_SITE_OTHER): Payer: Medicare HMO | Admitting: Physician Assistant

## 2017-01-13 ENCOUNTER — Encounter: Payer: Self-pay | Admitting: Physician Assistant

## 2017-01-13 VITALS — BP 140/86 | HR 70 | Temp 97.9°F | Resp 14 | Ht 67.0 in | Wt 199.0 lb

## 2017-01-13 DIAGNOSIS — M2540 Effusion, unspecified joint: Secondary | ICD-10-CM | POA: Diagnosis not present

## 2017-01-13 DIAGNOSIS — G6289 Other specified polyneuropathies: Secondary | ICD-10-CM | POA: Diagnosis not present

## 2017-01-13 DIAGNOSIS — R05 Cough: Secondary | ICD-10-CM | POA: Diagnosis not present

## 2017-01-13 DIAGNOSIS — L578 Other skin changes due to chronic exposure to nonionizing radiation: Secondary | ICD-10-CM | POA: Diagnosis not present

## 2017-01-13 DIAGNOSIS — D225 Melanocytic nevi of trunk: Secondary | ICD-10-CM | POA: Diagnosis not present

## 2017-01-13 DIAGNOSIS — Z85820 Personal history of malignant melanoma of skin: Secondary | ICD-10-CM | POA: Diagnosis not present

## 2017-01-13 DIAGNOSIS — R5383 Other fatigue: Secondary | ICD-10-CM | POA: Diagnosis not present

## 2017-01-13 DIAGNOSIS — D1801 Hemangioma of skin and subcutaneous tissue: Secondary | ICD-10-CM | POA: Diagnosis not present

## 2017-01-13 DIAGNOSIS — L820 Inflamed seborrheic keratosis: Secondary | ICD-10-CM | POA: Diagnosis not present

## 2017-01-13 DIAGNOSIS — L814 Other melanin hyperpigmentation: Secondary | ICD-10-CM | POA: Diagnosis not present

## 2017-01-13 DIAGNOSIS — R053 Chronic cough: Secondary | ICD-10-CM

## 2017-01-13 DIAGNOSIS — L821 Other seborrheic keratosis: Secondary | ICD-10-CM | POA: Diagnosis not present

## 2017-01-13 LAB — CBC WITH DIFFERENTIAL/PLATELET
BASOS PCT: 0.7 % (ref 0.0–3.0)
Basophils Absolute: 0.1 10*3/uL (ref 0.0–0.1)
Eosinophils Absolute: 0.3 10*3/uL (ref 0.0–0.7)
Eosinophils Relative: 3.7 % (ref 0.0–5.0)
HEMATOCRIT: 40.3 % (ref 36.0–46.0)
Hemoglobin: 13.8 g/dL (ref 12.0–15.0)
LYMPHS ABS: 1.8 10*3/uL (ref 0.7–4.0)
LYMPHS PCT: 24.6 % (ref 12.0–46.0)
MCHC: 34.2 g/dL (ref 30.0–36.0)
MCV: 93.4 fl (ref 78.0–100.0)
MONOS PCT: 6.9 % (ref 3.0–12.0)
Monocytes Absolute: 0.5 10*3/uL (ref 0.1–1.0)
NEUTROS ABS: 4.8 10*3/uL (ref 1.4–7.7)
NEUTROS PCT: 64.1 % (ref 43.0–77.0)
PLATELETS: 196 10*3/uL (ref 150.0–400.0)
RBC: 4.31 Mil/uL (ref 3.87–5.11)
RDW: 13.3 % (ref 11.5–15.5)
WBC: 7.5 10*3/uL (ref 4.0–10.5)

## 2017-01-13 LAB — SEDIMENTATION RATE: Sed Rate: 14 mm/hr (ref 0–30)

## 2017-01-13 MED ORDER — GABAPENTIN 100 MG PO CAPS: 100.0000 mg | ORAL_CAPSULE | Freq: Two times a day (BID) | ORAL | 0 refills | Status: DC

## 2017-01-13 NOTE — Progress Notes (Signed)
Pre visit review using our clinic review tool, if applicable. No additional management support is needed unless otherwise documented below in the visit note. 

## 2017-01-13 NOTE — Progress Notes (Signed)
Patient presents to clinic today c/o continued fatigue with slight exacerbation of neuropathy and with new complaint of joint swelling and stiffness. Patient is currently on a regimen of Gabapentin for neuropathic pain. States she is not taking this as directed. States pain has worsened since stopping medication. Patient notes continued fatigue with previous negative workup. Is now noting swelling, pain and stiffness in the joints of her hands bilaterally. Also notes some issue with her knees but states is much milder. Has history of OA. Denies any prior diagnosis of RA or psoriatic arthritis.   Is still noting a chronic cough from time to time. Patient has been treated with Protonix for potential silent reflux with some improvement. Has had extensive workup for chronic cough including CTA that was negative for cause. Is ready to consider assessment with a Pulmonologist. .  Past Medical History:  Diagnosis Date  . Arthritis    "spine" (07/28/2014)  . Blood dyscrasia    bruises and bleed easily  . Breast cancer, left breast ([place]) 07/28/2014  . Breast cancer, right breast ([place]) 1995  . Chronic back pain    "all over"  . Complication of anesthesia    went to sleep easily but hard to wake up up until elbow OR in 2010  . Depression   . Dyspnea    Normal Spirometry 03/2008 EF 65% BNP normal 11/2007  . Family history of breast cancer   . Family history of colon cancer   . Family history of pancreatic cancer   . Gastric polyps   . GERD (gastroesophageal reflux disease)   . History of chicken pox   . History of hiatal hernia   . Hx of adenomatous polyp of colon 07/03/2015  . Kidney stone    right kidney   . Lichen sclerosus   . Melanoma ([place]) 2010   "right elbow; [facility] at [facility]"  . Mild anxiety   . Vitamin B12 deficiency   . Vitamin D deficiency     Current Outpatient Prescriptions on File Prior to Visit  Medication Sig Dispense Refill  . anastrozole (ARIMIDEX) 1 MG tablet Take 1 tablet  (1 mg total) by mouth daily. 90 tablet 4  . atorvastatin (LIPITOR) 10 MG tablet Take 1 tablet (10 mg total) by mouth daily. 90 tablet 1  . cetirizine (ZYRTEC ALLERGY) 10 MG tablet Take 10 mg by mouth daily.    . citalopram (CELEXA) 10 MG tablet TAKE 1 TABLET (10 MG TOTAL) BY MOUTH DAILY. 30 tablet 5  . furosemide (LASIX) 40 MG tablet Take 40 mg by mouth 2 (two) times daily.     Marland Kitchen OVER THE COUNTER MEDICATION Probiotic 90 billion-Take 1 tablet by mouth daily.    . pantoprazole (PROTONIX) 40 MG tablet Take 1 tablet (40 mg total) by mouth daily. 30 tablet 3  . [DISCONTINUED] sucralfate (CARAFATE) 1 GM/10ML suspension Take 1 g by mouth at bedtime as needed.       No current facility-administered medications on file prior to visit.     Allergies  Allergen Reactions  . Silodosin Rash    Facial rash  . Ace Inhibitors Other (See Comments)    REACTION: angioedema  . Buprenorphine Hcl Other (See Comments)    "crazy"  . Morphine And Related Other (See Comments)    "crazy"  . Codeine Rash  . Sulfonamide Derivatives Rash  . Telmisartan Rash    Family History  Problem Relation Age of Onset  . Colon cancer Cousin 11  double first cousin  . Colon cancer Cousin 65       double first cousin  . Colon polyps Father        between 21-20  . Dementia Father   . CAD Father        CABG at age 17  . Stroke Mother   . Pancreatic cancer Brother 91  . Colon polyps Brother        between 10-20  . Cancer Paternal Uncle        NOS  . Anemia Paternal Grandfather        pernicious anemia  . Breast cancer Cousin 43       double first cousin  . Breast cancer Cousin 74       paternal cousin  . Stroke Other        F 1st degree relative 4, M 1st degree relative    Social History   Social History  . Marital status: Married    Spouse name: N/A  . Number of children: 2  . Years of education: N/A   Occupational History  . Manager    Social History Main Topics  . Smoking status: Never  Smoker  . Smokeless tobacco: Never Used     Comment: Regular Exercise - Yes  . Alcohol use No  . Drug use: No  . Sexual activity: Yes   Other Topics Concern  . None   Social History Narrative  . None   Review of Systems - See HPI.  All other ROS are negative.  BP 140/86   Pulse 70   Temp 97.9 F (36.6 C) (Oral)   Resp 14   Ht 5' 7"  (1.702 m)   Wt 199 lb (90.3 kg)   SpO2 96%   BMI 31.17 kg/m   Physical Exam  Constitutional: She is oriented to person, place, and time and well-developed, well-nourished, and in no distress.  HENT:  Head: Normocephalic and atraumatic.  Eyes: Conjunctivae are normal.  Neck: Neck supple.  Cardiovascular: Normal rate, regular rhythm, normal heart sounds and intact distal pulses.   Pulmonary/Chest: Effort normal and breath sounds normal. No respiratory distress. She has no wheezes. She has no rales. She exhibits no tenderness.  Abdominal: Soft. Bowel sounds are normal. She exhibits no distension. There is no tenderness. There is no rebound.  Musculoskeletal:  Fingers of hands noted to be swollen today with some tenderness. No redness or decreased range of motion noted. Patient endorses stiffness during exam. Strength is preserved. No rash noted. Sensation intact on examination.  Neurological: She is alert and oriented to person, place, and time. No cranial nerve deficit.  Skin: Skin is warm and dry. No rash noted.  Psychiatric: Affect normal.  Vitals reviewed.   Recent Results (from the past 2160 hour(s))  CBC w/Diff     Status: None   Collection Time: 11/05/16 11:53 AM  Result Value Ref Range   WBC 6.5 4.0 - 10.5 K/uL   RBC 4.61 3.87 - 5.11 Mil/uL   Hemoglobin 14.4 12.0 - 15.0 g/dL   HCT 42.4 36.0 - 46.0 %   MCV 91.9 78.0 - 100.0 fl   MCHC 34.0 30.0 - 36.0 g/dL   RDW 13.3 11.5 - 15.5 %   Platelets 178.0 150.0 - 400.0 K/uL   Neutrophils Relative % 60.4 43.0 - 77.0 %   Lymphocytes Relative 27.1 12.0 - 46.0 %   Monocytes Relative 8.2 3.0  - 12.0 %   Eosinophils Relative 3.4 0.0 -  5.0 %   Basophils Relative 0.9 0.0 - 3.0 %   Neutro Abs 4.0 1.4 - 7.7 K/uL   Lymphs Abs 1.8 0.7 - 4.0 K/uL   Monocytes Absolute 0.5 0.1 - 1.0 K/uL   Eosinophils Absolute 0.2 0.0 - 0.7 K/uL   Basophils Absolute 0.1 0.0 - 0.1 K/uL  Comp Met (CMET)     Status: None   Collection Time: 11/05/16 11:53 AM  Result Value Ref Range   Sodium 142 135 - 145 mEq/L   Potassium 3.9 3.5 - 5.1 mEq/L   Chloride 103 96 - 112 mEq/L   CO2 32 19 - 32 mEq/L   Glucose, Bld 95 70 - 99 mg/dL   BUN 17 6 - 23 mg/dL   Creatinine, Ser 0.96 0.40 - 1.20 mg/dL   Total Bilirubin 0.6 0.2 - 1.2 mg/dL   Alkaline Phosphatase 69 39 - 117 U/L   AST 24 0 - 37 U/L   ALT 20 0 - 35 U/L   Total Protein 7.0 6.0 - 8.3 g/dL   Albumin 4.6 3.5 - 5.2 g/dL   Calcium 9.9 8.4 - 10.5 mg/dL   GFR 60.93 >60.00 mL/min  TSH     Status: None   Collection Time: 11/05/16 11:53 AM  Result Value Ref Range   TSH 2.19 0.35 - 4.50 uIU/mL  B12     Status: Abnormal   Collection Time: 11/05/16 11:53 AM  Result Value Ref Range   Vitamin B-12 >1500 (H) 211 - 911 pg/mL  VITAMIN D 25 Hydroxy (Vit-D Deficiency, Fractures)     Status: None   Collection Time: 11/05/16 11:53 AM  Result Value Ref Range   VITD 54.11 30.00 - 100.00 ng/mL  Comp Met (CMET)     Status: Abnormal   Collection Time: 11/13/16  8:57 AM  Result Value Ref Range   Sodium 143 135 - 145 mEq/L   Potassium 3.7 3.5 - 5.1 mEq/L   Chloride 104 96 - 112 mEq/L   CO2 30 19 - 32 mEq/L   Glucose, Bld 103 (H) 70 - 99 mg/dL   BUN 17 6 - 23 mg/dL   Creatinine, Ser 0.87 0.40 - 1.20 mg/dL   Total Bilirubin 0.6 0.2 - 1.2 mg/dL   Alkaline Phosphatase 65 39 - 117 U/L   AST 26 0 - 37 U/L   ALT 20 0 - 35 U/L   Total Protein 6.5 6.0 - 8.3 g/dL   Albumin 4.3 3.5 - 5.2 g/dL   Calcium 9.7 8.4 - 10.5 mg/dL   GFR 68.26 >60.00 mL/min  Lipase     Status: None   Collection Time: 11/13/16  8:57 AM  Result Value Ref Range   Lipase 35.0 11.0 - 59.0 U/L  H.  pylori antibody, IgG     Status: None   Collection Time: 11/13/16  8:57 AM  Result Value Ref Range   H Pylori IgG Negative Negative  Magnesium     Status: None   Collection Time: 11/13/16  8:57 AM  Result Value Ref Range   Magnesium 1.9 1.5 - 2.5 mg/dL  Troponin I     Status: None   Collection Time: 11/13/16  8:57 AM  Result Value Ref Range   TNIDX 0.00 0.00 - 0.06 ug/l  D-Dimer, Quantitative     Status: Abnormal   Collection Time: 11/13/16  8:57 AM  Result Value Ref Range   D-Dimer, Quant 0.55 (H) <0.50 mcg/mL FEU    Comment:   The D-Dimer test  is used frequently to exclude an acute PE or DVT.  In patients with a low to moderate clinical risk assessment and a D-Dimer result <0.50 mcg/mL FEU, the likelihood of a PE or DVT is very low.  However, a thromboembolic event should not be excluded solely on the basis of the D-Dimer level.  Increased levels of D-Dimer are associated with a PE, DVT, DIC, malignancies, inflammation, sepsis, surgery, trauma, pregnancy, and advancing patient age. [Jama 2006 11:295(2): 466-599]   For additional information, please refer to: http://education.[facility].com/faq/FAQ149 (This link is being provided for information/ educational purposes only)     Comp Met (CMET)     Status: Abnormal   Collection Time: 11/25/16  9:57 AM  Result Value Ref Range   Sodium 143 135 - 145 mEq/L   Potassium 3.7 3.5 - 5.1 mEq/L   Chloride 103 96 - 112 mEq/L   CO2 31 19 - 32 mEq/L   Glucose, Bld 100 (H) 70 - 99 mg/dL   BUN 15 6 - 23 mg/dL   Creatinine, Ser 0.88 0.40 - 1.20 mg/dL   Total Bilirubin 0.6 0.2 - 1.2 mg/dL   Alkaline Phosphatase 64 39 - 117 U/L   AST 30 0 - 37 U/L   ALT 28 0 - 35 U/L   Total Protein 7.2 6.0 - 8.3 g/dL   Albumin 4.4 3.5 - 5.2 g/dL   Calcium 9.6 8.4 - 10.5 mg/dL   GFR 67.36 >60.00 mL/min  IBC panel     Status: Abnormal   Collection Time: 11/25/16  9:57 AM  Result Value Ref Range   Iron 155 (H) 42 - 145 ug/dL   Transferrin  284.0 212.0 - 360.0 mg/dL   Saturation Ratios 39.0 20.0 - 50.0 %  TSH     Status: None   Collection Time: 11/25/16  9:57 AM  Result Value Ref Range   TSH 2.44 0.35 - 4.50 uIU/mL  Pro b natriuretic peptide     Status: None   Collection Time: 12/03/16  9:51 AM  Result Value Ref Range   NT-Pro BNP 62 0 - 301 pg/mL    Comment: The following cut-points have been suggested for the use of proBNP for the diagnostic evaluation of heart failure (HF) in patients with acute dyspnea: Modality                     Age           Optimal Cut                            (years)            Point ------------------------------------------------------ Diagnosis (rule in HF)        <50            450 pg/mL                           50 - 75            900 pg/mL                               >75           1800 pg/mL Exclusion (rule out HF)  Age independent     300 pg/mL   Myocardial Perfusion Imaging     Status: None  Collection Time: 12/25/16  1:12 PM  Result Value Ref Range   Rest HR 55 bpm   Rest BP 154/92 mmHg   Peak HR 87 bpm   Peak BP 175/83 mmHg   SSS 2    SRS 0    SDS 2    LHR 0.21    TID 0.79    LV sys vol 28 mL   LV dias vol 89 46 - 106 mL    Assessment/Plan: 1. Joint swelling 2. Other fatigue Needs further assessment today. Concern for inflammatory arthritis. Will check CBC, ESR, RF and Anit CCP antibody. Will refer to Rheumatology for further assessment and management pending lab results.  - CBC w/Diff - Sed Rate (ESR) - Rheumatoid Factor - Cyclic citrul peptide antibody, IgG  3. Chronic cough Unclear etiology. Patient has had significant prior workup including CTA. Referral placed to Pulmonology for PFTs and further assessment.  - Ambulatory referral to Pulmonology  4. Other polyneuropathy Restart Gabapentin at previous dose. Neurology appointment pending.    Leeanne Rio, PA-C

## 2017-01-13 NOTE — Patient Instructions (Signed)
Please go to the lab for blood work. I will call you with your results.  Please restart Gabapentin as directed. I have sent in a new script.  I am working on expediting your appointment with Dr. Posey Pronto.  I am setting you up with Pulmonology today for further assessment of chronic respiratory issues.   We will alter regimen based on lab results.

## 2017-01-14 ENCOUNTER — Other Ambulatory Visit: Payer: Self-pay | Admitting: Physician Assistant

## 2017-01-14 DIAGNOSIS — M2560 Stiffness of unspecified joint, not elsewhere classified: Secondary | ICD-10-CM

## 2017-01-14 DIAGNOSIS — M2540 Effusion, unspecified joint: Secondary | ICD-10-CM

## 2017-01-14 LAB — CYCLIC CITRUL PEPTIDE ANTIBODY, IGG: Cyclic Citrullin Peptide Ab: <16 Units

## 2017-01-14 LAB — RHEUMATOID FACTOR

## 2017-01-22 ENCOUNTER — Other Ambulatory Visit: Payer: Self-pay | Admitting: Physician Assistant

## 2017-01-22 DIAGNOSIS — F339 Major depressive disorder, recurrent, unspecified: Secondary | ICD-10-CM

## 2017-01-24 DIAGNOSIS — C50912 Malignant neoplasm of unspecified site of left female breast: Secondary | ICD-10-CM | POA: Diagnosis not present

## 2017-01-24 DIAGNOSIS — C50911 Malignant neoplasm of unspecified site of right female breast: Secondary | ICD-10-CM | POA: Diagnosis not present

## 2017-02-08 DIAGNOSIS — Z8719 Personal history of other diseases of the digestive system: Secondary | ICD-10-CM | POA: Diagnosis not present

## 2017-02-08 DIAGNOSIS — R509 Fever, unspecified: Secondary | ICD-10-CM | POA: Diagnosis not present

## 2017-02-08 DIAGNOSIS — I10 Essential (primary) hypertension: Secondary | ICD-10-CM | POA: Diagnosis not present

## 2017-02-08 DIAGNOSIS — J4520 Mild intermittent asthma, uncomplicated: Secondary | ICD-10-CM | POA: Diagnosis not present

## 2017-02-08 DIAGNOSIS — R05 Cough: Secondary | ICD-10-CM | POA: Diagnosis not present

## 2017-02-11 ENCOUNTER — Institutional Professional Consult (permissible substitution): Payer: Medicare HMO | Admitting: Pulmonary Disease

## 2017-02-17 ENCOUNTER — Encounter: Payer: Self-pay | Admitting: Neurology

## 2017-02-17 ENCOUNTER — Ambulatory Visit (INDEPENDENT_AMBULATORY_CARE_PROVIDER_SITE_OTHER): Payer: Medicare HMO | Admitting: Neurology

## 2017-02-17 VITALS — BP 110/70 | HR 76 | Ht 67.5 in | Wt 195.0 lb

## 2017-02-17 DIAGNOSIS — R292 Abnormal reflex: Secondary | ICD-10-CM

## 2017-02-17 DIAGNOSIS — M542 Cervicalgia: Secondary | ICD-10-CM

## 2017-02-17 DIAGNOSIS — R278 Other lack of coordination: Secondary | ICD-10-CM

## 2017-02-17 DIAGNOSIS — R202 Paresthesia of skin: Secondary | ICD-10-CM

## 2017-02-17 NOTE — Progress Notes (Signed)
[facility] Clinic Note - Initial Visit   Date: 02/17/17  [name] MRN: [id number] DOB: Oct 21, 1945   Dear Raiford Osmanovic, PA-C:  Thank you for your kind referral of [name] for consultation of neuropathy. Although her history is well known to you, please allow [place] to reiterate it for the purpose of our medical record. The patient was accompanied to the clinic by self.    History of Present Illness: [name] is a 71 y.o. right-handed Caucasian female with hyperlipidemia, GERD, history of breast cancer (1996, 2016) s/p chemotherapy and bilateral mastectomy and right elbow melanoma presenting for evaluation of generalized paresthesias.    Starting around June 2018, she began having tingling and numbness over the neck, chest, arms, and legs. There is sensitivity of her skin, especially over the arms and numbness in the lateral thigh and lower leg.  Symptoms are constant and worse with standing. She also complains of achy muscles.  The only preciptitating event is a fall she had in May 2018, while going down the stairs.  She endorses generalized throbbing pain involving her neck, low back pain, shoulder, hips, and knee.  Her ESR, RF, and CCP is normal.  She has been referred to rheatumology for evaluation.  She gets relieve with Aleve, hot baths.  She takes gabapentin 336m at bedtime which does not help and she did not tolerate higher dose. She denies numbness/tingling of the face.    Out-side paper records, electronic medical record, and images have been reviewed where available and summarized as:  MRI brain wwo contrast 02/27/2016: 1. No evidence for intracranial metastatic disease. 2. No acute abnormality is identified. 3. Mild chronic microvascular ischemic changes and parenchymal volume loss. 4. Mild paranasal sinus disease.  Labs 11/2016:  ESR 14, RF neg CCP neg, TSH 2.44, Vitamin D 54, vitamin B12 >1500  Past Medical History:  Diagnosis Date   . Arthritis    "spine" (07/28/2014)  . Blood dyscrasia    bruises and bleed easily  . [place] cancer, [place] breast [place] 07/28/[place]  . Breast cancer, right breast ([facility] 1995  . Chronic back pain    "all over"  . Complication of anesthesia    went to sleep easily but hard to wake up up until elbow OR in 2010  . Depression   . Dyspnea    Normal Spirometry 03/2008 EF 65% BNP normal 11/2007  . Family history of breast cancer   . Family history of colon cancer   . Family history of pancreatic cancer   . Gastric polyps   . GERD (gastroesophageal reflux disease)   . History of chicken pox   . History of hiatal hernia   . Hx of adenomatous polyp of colon 07/03/2015  . Kidney stone    right kidney   . Lichen sclerosus   . [place] [place]   [facility]  . Mild anxiety   . Vitamin B12 deficiency   . Vitamin D deficiency     Past Surgical History:  Procedure Laterality Date  . BREAST BIOPSY Left 04/2014  . BREAST RECONSTRUCTION WITH PLACEMENT OF TISSUE EXPANDER AND FLEX HD (ACELLULAR HYDRATED DERMIS) Left 07/28/2014   Procedure: LEFT BREAST RECONSTRUCTION PLACEMENT OF LEFT TISSUE EXPANDER ;  Surgeon: DCrissie Reese MD;  [place] [facility]  Service: Plastics;  Laterality: Left;  . BREAST RECONSTRUCTION WITH PLACEMENT OF TISSUE EXPANDER AND FLEX HD (ACELLULAR HYDRATED DERMIS) Left 09/08/2014   Procedure: REMOVAL OF TISSUE  EXPANDER FROM LEFT BREAST;  Surgeon: Crissie Reese, MD;  Location: [place];  Service: Plastics;  Laterality: Left;  . BUNIONECTOMY Bilateral 1970's  . COLONOSCOPY     Dr Sharlett Iles  . CYSTOSCOPY WITH RETROGRADE PYELOGRAM, URETEROSCOPY AND STENT PLACEMENT Right 06/09/2015   Procedure: CYSTOSCOPY WITH RIGHT RETROGRADE PYELOGRAM, RIGHT URETEROSCOPY AND RIGHT URTERAL STENT PLACEMENT;  Surgeon: Ardis Hughs, MD;  [facility];  Service: Urology;  Laterality: Right;  . ESOPHAGOGASTRODUODENOSCOPY    . HERNIA REPAIR    . HOLMIUM LASER APPLICATION Right  01/03/1750   Procedure: HOLMIUM LASER APPLICATION;  Surgeon: Ardis Hughs, MD;  [facility];  Service: Urology;  Laterality: Right;  . LATISSIMUS FLAP TO BREAST Left 09/08/2014   Procedure: LEFT LATISSIMUS FLAP TO BREAST WITH SALINE IMPLANT FOR BREAST RECONSTRUCTION;  Surgeon: Crissie Reese, MD;  Location: [place];  Service: Plastics;  Laterality: Left;  Marland Kitchen MASTECTOMY Right 1996    chemotherapy. pt. states 13 lymph nodes were removed  . MASTECTOMY COMPLETE / SIMPLE W/ SENTINEL NODE BIOPSY Left 07/28/2014  . MASTECTOMY W/ SENTINEL NODE BIOPSY Left 07/28/2014   Procedure: LEFT MASTECTOMY WITH SENTINEL LYMPH NODE MAPPING;  Surgeon: Autumn Messing III, MD;  Location: [place];  Service: General;  Laterality: Left;  Marland Kitchen MELANOMA EXCISION Right 2010   From elbow-- [facility]   . NISSEN FUNDOPLICATION  06/5850  . RECONSTRUCTION BREAST IMMEDIATE / DELAYED W/ TISSUE EXPANDER Left 07/28/2014  . TEMPOROMANDIBULAR JOINT SURGERY Bilateral 1987  . TONSILLECTOMY       Medications:  Outpatient Encounter Prescriptions as of 02/17/2017  Medication Sig  . anastrozole (ARIMIDEX) 1 MG tablet Take 1 tablet (1 mg total) by mouth daily.  Marland Kitchen atorvastatin (LIPITOR) 10 MG tablet Take 1 tablet (10 mg total) by mouth daily.  . cetirizine (ZYRTEC ALLERGY) 10 MG tablet Take 10 mg by mouth daily.  . citalopram (CELEXA) 10 MG tablet TAKE ONE TABLET BY MOUTH ONCE DAILY  . furosemide (LASIX) 40 MG tablet Take 40 mg by mouth 2 (two) times daily.   Marland Kitchen gabapentin (NEURONTIN) 100 MG capsule Take 1 capsule (100 mg total) by mouth 2 (two) times daily.  . pantoprazole (PROTONIX) 40 MG tablet Take 1 tablet (40 mg total) by mouth daily.  . [DISCONTINUED] OVER THE COUNTER MEDICATION Probiotic 90 billion-Take 1 tablet by mouth daily.   No facility-administered encounter medications on file as of 02/17/2017.      Allergies:  Allergies  Allergen Reactions  . Silodosin Rash    Facial rash  . Ace Inhibitors Other (See Comments)     REACTION: angioedema  . Buprenorphine Hcl Other (See Comments)    "crazy"  . Morphine And Related Other (See Comments)    "crazy"  . Codeine Rash  . Sulfonamide Derivatives Rash  . Telmisartan Rash    Family History: Family History  Problem Relation Age of Onset  . Colon cancer Cousin 35       double first cousin  . Colon cancer Cousin 5       double first cousin  . Colon polyps Father        between 43-20  . Dementia Father   . CAD Father        CABG at age 43  . Stroke Mother   . Pancreatic cancer Brother 60  . Colon polyps Brother        between 10-20  . Cancer Paternal Uncle        NOS  .  Anemia Paternal Grandfather        pernicious anemia  . Breast cancer Cousin 48       double first cousin  . Breast cancer Cousin 74       paternal cousin  . Stroke Other        F 1st degree relative 23, M 1st degree relative    Social History: Social History  Substance Use Topics  . Smoking status: Never Smoker  . Smokeless tobacco: Never Used     Comment: Regular Exercise - Yes  . Alcohol use No   Social History   Social History Narrative  . No narrative on file    Review of Systems:  CONSTITUTIONAL: No fevers, chills, night sweats, or weight loss.   EYES: No visual changes or eye pain ENT: No hearing changes.  No history of nose bleeds.   RESPIRATORY: No cough, wheezing and shortness of breath.   CARDIOVASCULAR: Negative for chest pain, and palpitations.   GI: Negative for abdominal discomfort, blood in stools or black stools.  No recent change in bowel habits.   GU:  No history of incontinence.   MUSCLOSKELETAL: + history of joint pain or swelling.  +myalgias.   SKIN: Negative for lesions, rash, and itching.   HEMATOLOGY/ONCOLOGY: Negative for prolonged bleeding, bruising easily, and swollen nodes.  +history of cancer.   ENDOCRINE: Negative for cold or heat intolerance, polydipsia or goiter.   PSYCH:  No depression or anxiety symptoms.   NEURO: As  Above.   Vital Signs:  BP 110/70   Pulse 76   Ht 5' 7.5" (1.715 m)   Wt 195 lb (88.5 kg)   SpO2 96%   BMI 30.09 kg/m    General Medical Exam:   General:  Well appearing, comfortable.   Eyes/ENT: see cranial nerve examination.   Neck: No masses appreciated.  Full range of motion without tenderness.  No carotid bruits. Respiratory:  Clear to auscultation, good air entry bilaterally.   Cardiac:  Regular rate and rhythm, no murmur.   Extremities:  No deformities, edema, or skin discoloration.  She is tender to palpation over 14/18 fibromyalgia tenderpoints. Skin:  No rashes or lesions.  Neurological Exam: MENTAL STATUS including orientation to time, place, person, recent and remote memory, attention span and concentration, language, and fund of knowledge is normal.  Speech is not dysarthric.  CRANIAL NERVES: II:  No visual field defects.  Unremarkable fundi.   III-IV-VI: Pupils equal round and reactive to light.  Normal conjugate, extra-ocular eye movements in all directions of gaze.  No nystagmus.  Subtle right ptosis.   V:  Normal facial sensation.     VII:  Normal facial symmetry and movements.  VIII:  Normal hearing and vestibular function.   IX-X:  Normal palatal movement.   XI:  Normal shoulder shrug and head rotation.   XII:  Normal tongue strength and range of motion, no deviation or fasciculation.  MOTOR:  Motor strength is 5/5 throughout. No atrophy, fasciculations or abnormal movements.  No pronator drift.  Tone is normal.    MSRs:  Right                                                                 Left brachioradialis 2+  brachioradialis 2+  biceps 2+  biceps 2+  triceps 2+  triceps 2+  patellar 2+  patellar 2+  ankle jerk 1+  ankle jerk 1+  Hoffman no  Hoffman no  plantar response down  plantar response down   SENSORY:  Reduced vibration at the left great toe, pin prick and temperature is reduced in the legs over the lateral surface of the thigh, lower leg,  and dorsum of the left foot. There is no sensory level.  There is sway with Rhomberg testing.  COORDINATION/GAIT: Dysmetria with bilateral finger-to- nose-finger testing.  Intact rapid alternating movements bilaterally.  Able to rise from a chair without using arms.  Gait narrow based and stable. Tandem and stressed gait intact.    IMPRESSION: Whole body paresthesias, sparing the face.  Her paresthesias do not conform to a cutaneous nerve or dermatomal distribution.  Her exam shows some patchy sensory loss over the left lower leg (?L5 radiculopathy vs superficial peroneal mononeuropathy), but there is no weakness.  There is no evidence of vitamin B12 deficiency.  Given the patchy nature of her symptoms and recent fall, MRI cervical spine and MRI brian will be ordered, but it would be unusual to have primary sensory loss involving the trunk and all four limbs in isolation.  If her imaging is nondiagnostic, I think it is reasonable to consider fibromyalgia as a possibility.  The duration of this appointment visit was 40 minutes of face-to-face time with the patient.  Greater than 50% of this time was spent in counseling, explanation of diagnosis, planning of further management, and coordination of care.   Thank you for allowing me to participate in patient's care.  If I can answer any additional questions, I would be pleased to do so.    Sincerely,    Donika K. Posey Pronto, DO

## 2017-02-17 NOTE — Patient Instructions (Addendum)
1.  MRI brain without contrast 2.  MRI cervical spine without   We will call you with the results of your testing

## 2017-03-02 ENCOUNTER — Ambulatory Visit
Admission: RE | Admit: 2017-03-02 | Discharge: 2017-03-02 | Disposition: A | Discharge status: Home / Self Care | Payer: Medicare HMO | Source: Ambulatory Visit | Attending: Neurology | Admitting: Neurology

## 2017-03-02 DIAGNOSIS — R278 Other lack of coordination: Secondary | ICD-10-CM

## 2017-03-02 DIAGNOSIS — R292 Abnormal reflex: Secondary | ICD-10-CM

## 2017-03-02 DIAGNOSIS — R202 Paresthesia of skin: Secondary | ICD-10-CM

## 2017-03-02 DIAGNOSIS — M4802 Spinal stenosis, cervical region: Secondary | ICD-10-CM | POA: Diagnosis not present

## 2017-03-02 DIAGNOSIS — M542 Cervicalgia: Secondary | ICD-10-CM

## 2017-03-03 ENCOUNTER — Telehealth: Payer: Self-pay | Admitting: *Deleted

## 2017-03-03 ENCOUNTER — Other Ambulatory Visit: Payer: Self-pay | Admitting: Physician Assistant

## 2017-03-03 DIAGNOSIS — K219 Gastro-esophageal reflux disease without esophagitis: Secondary | ICD-10-CM

## 2017-03-03 NOTE — Telephone Encounter (Signed)
Patient given results and would like referral to neurosurgery.

## 2017-03-03 NOTE — Telephone Encounter (Signed)
-----   Message from Alda Berthold, DO sent at 03/03/2017 12:39 PM EDT ----- Please inform patient that her MRI brain does not show anything worrisome causing her tingling.  She has several areas in the neck region with disc bulge and arthritis which is a little worse when compared to her MRI from 2005.  I dont think that it is causing all of her symptoms because would only expect numbness/tingling of the arms, but if she would like to see neurosurgery for their opinion, ok to the send the referral for cervical spondylosis.

## 2017-03-06 NOTE — Progress Notes (Signed)
Cardiology Office Note   Date:  03/10/2017   ID:  [name], DOB 1945/10/29, MRN [id number]  PCP:  [name] Jeans, PA-C  Cardiologist:   [name] Rouge, MD   No chief complaint on file.     History of Present Illness: [name] is a 71 y.o. female who presents for f/u of dyspnea. I have not seen since 2016. At that time I saw [name] for pre op clearance left mastectomy for recurrent breast cancer. She was cleared without testing .  She also has chronic back pain, HLD, depression, GERD. Seen by PA July 2018 for dyspnea and peripheral edema CTA negative PE and duplex no DVT She has significant neuropathy and takes gabapentin. BNP normal   Myovue done 12/25/16 normal no ischemia/infarct EF 69%   She has had myalgias and tingling numbness over neck Seen by neurology and ESR RF CCP normal  She gets relief with aleve and hot baths MRI 03/02/17  no acute abnormality ? Diagnosis fibromyalgia C spine with mild multilevel foraminal narrowing    Past Medical History:  Diagnosis Date  . Arthritis    "spine" (07/28/2014)  . Blood dyscrasia    bruises and bleed easily  . Breast cancer, left breast ([place]) 07/28/2014  . Breast cancer, right breast ([place]) 1995  . Chronic back pain    "all over"  . Complication of anesthesia    went to sleep easily but hard to wake up up until elbow OR in 2010  . Depression   . Dyspnea    Normal Spirometry 03/2008 EF 65% BNP normal 11/2007  . Family history of breast cancer   . Family history of colon cancer   . Family history of pancreatic cancer   . Gastric polyps   . GERD (gastroesophageal reflux disease)   . History of chicken pox   . History of hiatal hernia   . Hx of adenomatous polyp of colon 07/03/2015  . Kidney stone    right kidney   . Lichen sclerosus   . Melanoma ([place]) 2010   "right elbow; [facility]"  . Mild anxiety   . Vitamin B12 deficiency   . Vitamin D deficiency     Past Surgical History:  Procedure Laterality Date  .  BREAST BIOPSY Left 04/2014  . BREAST RECONSTRUCTION WITH PLACEMENT OF TISSUE EXPANDER AND FLEX HD (ACELLULAR HYDRATED DERMIS) Left 07/28/2014   Procedure: LEFT BREAST RECONSTRUCTION PLACEMENT OF LEFT TISSUE EXPANDER ;  Surgeon: [name] Reese, MD;  Location: [place];  Service: Plastics;  Laterality: Left;  . BREAST RECONSTRUCTION WITH PLACEMENT OF TISSUE EXPANDER AND FLEX HD (ACELLULAR HYDRATED DERMIS) Left 09/08/2014   Procedure: REMOVAL OF TISSUE EXPANDER FROM LEFT BREAST;  Surgeon: [name] Reese, MD;  Location: [place];  Service: Plastics;  Laterality: Left;  . BUNIONECTOMY Bilateral 1970's  . COLONOSCOPY     Dr Sharlett Iles  . CYSTOSCOPY WITH RETROGRADE PYELOGRAM, URETEROSCOPY AND STENT PLACEMENT Right 06/09/2015   Procedure: CYSTOSCOPY WITH RIGHT RETROGRADE PYELOGRAM, RIGHT URETEROSCOPY AND RIGHT URTERAL STENT PLACEMENT;  Surgeon: Ardis Hughs, MD;  [facility];  Service: Urology;  Laterality: Right;  . ESOPHAGOGASTRODUODENOSCOPY    . HERNIA REPAIR    . HOLMIUM LASER APPLICATION Right 7/54/4920   Procedure: HOLMIUM LASER APPLICATION;  Surgeon: Ardis Hughs, MD;  [facility];  Service: Urology;  Laterality: Right;  . LATISSIMUS FLAP TO BREAST Left 09/08/2014   Procedure: LEFT LATISSIMUS FLAP TO BREAST WITH SALINE IMPLANT FOR  BREAST RECONSTRUCTION;  Surgeon: [name] Bowers, MD;  [facility];  Service: Plastics;  Laterality: Left;  . MASTECTOMY Right 1996    chemotherapy. pt. states 13 lymph nodes were removed  . MASTECTOMY COMPLETE / SIMPLE W/ SENTINEL NODE BIOPSY Left 07/28/2014  . MASTECTOMY W/ SENTINEL NODE BIOPSY Left 07/28/2014   Procedure: LEFT MASTECTOMY WITH SENTINEL LYMPH NODE MAPPING;  Surgeon: [name] Toth III, MD;  [facility];  Service: General;  Laterality: Left;  . MELANOMA EXCISION Right 2010   From elbow-- Done at [name]   . NISSEN FUNDOPLICATION  09/2008  . RECONSTRUCTION BREAST IMMEDIATE / DELAYED W/ TISSUE EXPANDER Left 07/28/2014  . TEMPOROMANDIBULAR JOINT SURGERY  Bilateral 1987  . TONSILLECTOMY       Current Outpatient Prescriptions  Medication Sig Dispense Refill  . anastrozole (ARIMIDEX) 1 MG tablet Take 1 tablet (1 mg total) by mouth daily. 90 tablet 4  . atorvastatin (LIPITOR) 10 MG tablet Take 1 tablet (10 mg total) by mouth daily. 90 tablet 1  . cetirizine (ZYRTEC ALLERGY) 10 MG tablet Take 10 mg by mouth daily.    . citalopram (CELEXA) 10 MG tablet TAKE ONE TABLET BY MOUTH ONCE DAILY 30 tablet 5  . furosemide (LASIX) 40 MG tablet Take 40 mg by mouth 2 (two) times daily.     . furosemide (LASIX) 40 MG tablet Take 40 mg by mouth daily.    . gabapentin (NEURONTIN) 100 MG capsule Take 1 capsule (100 mg total) by mouth 2 (two) times daily. 60 capsule 0  . pantoprazole (PROTONIX) 40 MG tablet TAKE 1 TABLET BY MOUTH ONCE DAILY 30 tablet 3   No current facility-administered medications for this visit.     Allergies:   Silodosin; Ace inhibitors; Buprenorphine hcl; Morphine and related; Codeine; Sulfonamide derivatives; and Telmisartan    Social History:  The patient  reports that she has never smoked. She has never used smokeless tobacco. She reports that she does not drink alcohol or use drugs.   Family History:  The patient's family history includes Anemia in [name] paternal grandfather; Breast cancer (age of onset: 74) in [name] cousin; Breast cancer (age of onset: 75) in [name] cousin; CAD in [name] father; Cancer in [name] paternal uncle; Colon cancer (age of onset: 69) in [name] cousin; Colon cancer (age of onset: 75) in [name] cousin; Colon polyps in [name] brother and father; Dementia in [name] father; Pancreatic cancer (age of onset: 78) in [name] brother; Stroke in [name] mother and other.    ROS:  Please see the history of present illness.   Otherwise, review of systems are positive for none.   All other systems are reviewed and negative.    PHYSICAL EXAM: VS:  BP (!) 154/86   Pulse 82   Ht 5' 7.5" (1.715 m)   Wt 196 lb 12 oz (89.2 kg)   BMI 30.36 kg/m  , BMI  Body mass index is 30.36 kg/m. Affect appropriate Healthy:  appears stated age HEENT: normal Neck supple with no adenopathy JVP normal no bruits no thyromegaly Lungs clear with no wheezing and good diaphragmatic motion Heart:  S1/S2 no murmur, no rub, gallop or click PMI normal Abdomen: benighn, BS positve, no tenderness, no AAA no bruit.  No HSM or HJR Distal pulses intact with no bruits No edema Neuro non-focal Skin warm and dry No muscular weakness    EKG:  SR rate 71 normal 12/03/16    Recent Labs: 11/13/2016: Magnesium 1.9 11/25/2016: ALT 28; BUN 15;   Creatinine, Ser 0.88; Potassium 3.7; Sodium 143; TSH 2.44 12/03/2016: NT-Pro BNP 62 01/13/2017: Hemoglobin 13.8; Platelets 196.0    Lipid Panel    Component Value Date/Time   CHOL 144 08/02/2016 0925   TRIG 102.0 08/02/2016 0925   HDL 46.80 08/02/2016 0925   CHOLHDL 3 08/02/2016 0925   VLDL 20.4 08/02/2016 0925   LDLCALC 77 08/02/2016 0925   LDLDIRECT 179.7 02/10/2012 0920      Wt Readings from Last 3 Encounters:  03/10/17 196 lb 12 oz (89.2 kg)  02/17/17 195 lb (88.5 kg)  01/13/17 199 lb (90.3 kg)      Other studies Reviewed: Additional studies/ records that were reviewed today include: Cardiology notes 2016 Notes PA, neurology and primary  myovue labs ECG MRI and C spine films .    ASSESSMENT AND PLAN:  1.  Dyspnea: non cardiac normal exam, ECG, BNP and EF by myovue 2. Chest Pain : atypical normal myovue 12/25/16 observe 3. Breast Cancer: post bilateral mastectomy f/u oncology on arimidex 4. Neuropathy/Fibromyalgia:  F/u neuro and primary on gabapentin and celexa   Current medicines are reviewed at length with the patient today.  The patient does not have concerns regarding medicines.  The following changes have been made:  no change  Labs/ tests ordered today include: None  No orders of the defined types were placed in this encounter.    Disposition:   FU with cardiology PRN      Signed, Peter  Nishan, MD  03/10/2017 8:27 AM    [facility] [address], [place],   [address] Phone: [phone]; Fax: [phone]  

## 2017-03-10 ENCOUNTER — Encounter: Payer: Self-pay | Admitting: Cardiovascular Disease

## 2017-03-10 ENCOUNTER — Ambulatory Visit (INDEPENDENT_AMBULATORY_CARE_PROVIDER_SITE_OTHER): Payer: Medicare HMO | Admitting: Cardiovascular Disease

## 2017-03-10 VITALS — BP 154/86 | HR 82 | Ht 67.5 in | Wt 196.8 lb

## 2017-03-10 DIAGNOSIS — R0602 Shortness of breath: Secondary | ICD-10-CM | POA: Diagnosis not present

## 2017-03-10 NOTE — Patient Instructions (Addendum)
Medication Instructions:  Your physician recommends that you continue on your current medications as directed. Please refer to the Current Medication list given to you today.  Labwork: NONE  Testing/Procedures: NONE  Follow-Up: Your physician wants you to follow-up in: 3 months with Dr. Nishan.   If you need a refill on your cardiac medications before your next appointment, please call your pharmacy.    

## 2017-03-11 ENCOUNTER — Ambulatory Visit (INDEPENDENT_AMBULATORY_CARE_PROVIDER_SITE_OTHER): Payer: Medicare HMO | Admitting: Pulmonary Disease

## 2017-03-11 ENCOUNTER — Encounter: Payer: Self-pay | Admitting: Pulmonary Disease

## 2017-03-11 ENCOUNTER — Telehealth: Payer: Self-pay | Admitting: Internal Medicine

## 2017-03-11 VITALS — BP 130/80 | HR 73 | Ht 67.5 in | Wt 195.4 lb

## 2017-03-11 DIAGNOSIS — R053 Chronic cough: Secondary | ICD-10-CM

## 2017-03-11 DIAGNOSIS — K219 Gastro-esophageal reflux disease without esophagitis: Secondary | ICD-10-CM | POA: Diagnosis not present

## 2017-03-11 DIAGNOSIS — R05 Cough: Secondary | ICD-10-CM

## 2017-03-11 LAB — NITRIC OXIDE: Nitric Oxide: 18

## 2017-03-11 MED ORDER — PANTOPRAZOLE SODIUM 40 MG PO TBEC: 40.0000 mg | DELAYED_RELEASE_TABLET | Freq: Two times a day (BID) | ORAL | 3 refills | Status: DC

## 2017-03-11 MED ORDER — ALBUTEROL SULFATE HFA 108 (90 BASE) MCG/ACT IN AERS: 2.0000 | INHALATION_SPRAY | Freq: Four times a day (QID) | RESPIRATORY_TRACT | 5 refills | Status: DC | PRN

## 2017-03-11 NOTE — Telephone Encounter (Signed)
Patient with chronic cough, Pulmonology has recommended she see Dr. Carlean Purl.  She is scheduled for 05/05/17 10:45.  She has been placed on the wait list

## 2017-03-11 NOTE — Telephone Encounter (Signed)
Left message for patient to call back  

## 2017-03-11 NOTE — Progress Notes (Signed)
[name]    [id number]    1945-06-03  Primary Care Physician:Martin, Luanna Cole, PA-C  Referring Physician: Brunetta Jeans, PA-C [address] [place], [place] [address]  Chief complaint:  Evaluation for chronic cough  HPI: 71 year old with history of breast cancer, GERD, hiatal hernia. She is complains of chronic cough. She's previously evaluated in the pulmonary clinic over 10 years ago for similar complaints. At that time it was thought to be secondary to GERD. She underwent Nissen's fundoplication for hiatal hernia in 2010 with improvement of symptoms. She reports that the cough has recurred over the past few years. This is constant, nonproductive in nature. She also has dyspnea with rest and activity, fatigue. She has been evaluated by her primary care with blood counts, thyroid levels which are normal. She follows with Dr. Carlean Purl, GI. She has been evaluated by ENT earlier this year with a negative examination. She was advised to take Zyrtec and Protonix for postnasal drip and GERD.  Pets: Cats, [place] a farm with horses, lamas Occupation: Semiretired. He used to run a Retail buyer facility. Exposures: Exposure to mold 10 years ago. No recent exposures. Smoking history: Never smoker Travel History: No recent travel  Outpatient Encounter Prescriptions as of 03/11/2017  Medication Sig  . anastrozole (ARIMIDEX) 1 MG tablet Take 1 tablet (1 mg total) by mouth daily.  Marland Kitchen atorvastatin (LIPITOR) 10 MG tablet Take 1 tablet (10 mg total) by mouth daily.  . cetirizine (ZYRTEC ALLERGY) 10 MG tablet Take 10 mg by mouth daily.  . Cholecalciferol (VITAMIN D3) 1000 units CAPS Take 1 capsule by mouth.  . citalopram (CELEXA) 10 MG tablet TAKE ONE TABLET BY MOUTH ONCE DAILY  . cyanocobalamin 2000 MCG tablet Take 2,000 mcg by mouth daily.  . furosemide (LASIX) 40 MG tablet Take 40 mg by mouth daily.  Marland Kitchen gabapentin (NEURONTIN) 100 MG capsule Take 1 capsule (100 mg total) by mouth 2  (two) times daily.  . pantoprazole (PROTONIX) 40 MG tablet TAKE 1 TABLET BY MOUTH ONCE DAILY  . Probiotic Product (PROBIOTIC DAILY) CAPS Take 1 capsule by mouth.  . [DISCONTINUED] furosemide (LASIX) 40 MG tablet Take 40 mg by mouth 2 (two) times daily.    No facility-administered encounter medications on file as of 03/11/2017.     Allergies as of 03/11/2017 - Review Complete 03/11/2017  Allergen Reaction Noted  . Silodosin Rash 04/06/2015  . Ace inhibitors Other (See Comments) 03/16/2008  . Buprenorphine hcl Other (See Comments) 04/06/2015  . Morphine and related Other (See Comments) 07/28/2014  . Codeine Rash   . Sulfonamide derivatives Rash   . Telmisartan Rash 04/14/2007    Past Medical History:  Diagnosis Date  . Arthritis    "spine" (07/28/2014)  . Blood dyscrasia    bruises and bleed easily  . Breast cancer, left breast ([place]) 07/28/2014  . Breast cancer, right breast ([place]) 1995  . Chronic back pain    "all over"  . Complication of anesthesia    went to sleep easily but hard to wake up up until elbow OR in 2010  . Depression   . Dyspnea    Normal Spirometry 03/2008 EF 65% BNP normal 11/2007  . Family history of breast cancer   . Family history of colon cancer   . Family history of pancreatic cancer   . Gastric polyps   . GERD (gastroesophageal reflux disease)   . History of chicken pox   .  History of hiatal hernia   . Hx of adenomatous polyp of colon 07/03/2015  . Kidney stone    right kidney   . Lichen sclerosus   . Melanoma ([place]) 2010   "right elbow; treated at [facility]"  . Mild anxiety   . Vitamin B12 deficiency   . Vitamin D deficiency     Past Surgical History:  Procedure Laterality Date  . BREAST BIOPSY Left 04/2014  . BREAST RECONSTRUCTION WITH PLACEMENT OF TISSUE EXPANDER AND FLEX HD (ACELLULAR HYDRATED DERMIS) Left 07/28/2014   Procedure: LEFT BREAST RECONSTRUCTION PLACEMENT OF LEFT TISSUE EXPANDER ;  Surgeon: Crissie Reese, MD;  Location: [place];  Service:  Plastics;  Laterality: Left;  . BREAST RECONSTRUCTION WITH PLACEMENT OF TISSUE EXPANDER AND FLEX HD (ACELLULAR HYDRATED DERMIS) Left 09/08/2014   Procedure: REMOVAL OF TISSUE EXPANDER FROM LEFT BREAST;  Surgeon: Crissie Reese, MD;  Location: [place];  Service: Plastics;  Laterality: Left;  . BUNIONECTOMY Bilateral 1970's  . COLONOSCOPY     Dr Sharlett Iles  . CYSTOSCOPY WITH RETROGRADE PYELOGRAM, URETEROSCOPY AND STENT PLACEMENT Right 06/09/2015   Procedure: CYSTOSCOPY WITH RIGHT RETROGRADE PYELOGRAM, RIGHT URETEROSCOPY AND RIGHT URTERAL STENT PLACEMENT;  Surgeon: Ardis Hughs, MD;  [facility];  Service: Urology;  Laterality: Right;  . ESOPHAGOGASTRODUODENOSCOPY    . HERNIA REPAIR    . HOLMIUM LASER APPLICATION Right 1/61/0960   Procedure: HOLMIUM LASER APPLICATION;  Surgeon: Ardis Hughs, MD;  [facility];  Service: Urology;  Laterality: Right;  . LATISSIMUS FLAP TO BREAST Left 09/08/2014   Procedure: LEFT LATISSIMUS FLAP TO BREAST WITH SALINE IMPLANT FOR BREAST RECONSTRUCTION;  Surgeon: Crissie Reese, MD;  Location: [place];  Service: Plastics;  Laterality: Left;  Marland Kitchen MASTECTOMY Right 1996    chemotherapy. pt. states 13 lymph nodes were removed  . MASTECTOMY COMPLETE / SIMPLE W/ SENTINEL NODE BIOPSY Left 07/28/2014  . MASTECTOMY W/ SENTINEL NODE BIOPSY Left 07/28/2014   Procedure: LEFT MASTECTOMY WITH SENTINEL LYMPH NODE MAPPING;  Surgeon: Autumn Messing III, MD;  Location: [place];  Service: General;  Laterality: Left;  Marland Kitchen MELANOMA EXCISION Right 2010   From elbow-- [facility] at [facility]   . NISSEN FUNDOPLICATION  45/4098  . RECONSTRUCTION BREAST IMMEDIATE / DELAYED W/ TISSUE EXPANDER Left 07/28/2014  . TEMPOROMANDIBULAR JOINT SURGERY Bilateral 1987  . TONSILLECTOMY      Family History  Problem Relation Age of Onset  . Colon cancer Cousin 74       double first cousin  . Colon cancer Cousin 36       double first cousin  . Colon polyps Father        between 3-20  . Dementia Father   . CAD  Father        CABG at age 54  . Stroke Mother   . Pancreatic cancer Brother 39  . Colon polyps Brother        between 10-20  . Cancer Paternal Uncle        NOS  . Anemia Paternal Grandfather        pernicious anemia  . Breast cancer Cousin 59       double first cousin  . Breast cancer Cousin 74       paternal cousin  . Stroke Other        F 1st degree relative 63, M 1st degree relative    Social History   Social History  . Marital status: Married    Spouse name: N/A  .  Number of children: 2  . Years of education: N/A   Occupational History  . Manager    Social History Main Topics  . Smoking status: Never Smoker  . Smokeless tobacco: Never Used     Comment: Regular Exercise - Yes  . Alcohol use No  . Drug use: No  . Sexual activity: Yes   Other Topics Concern  . Not on file   Social History Narrative   She lives with husband.     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  HENT: Negative.   Eyes: Negative for blurred vision.  Respiratory: as per HPI  Cardiovascular: Negative for chest pain and palpitations.  Gastrointestinal: Negative for vomiting, diarrhea, blood per rectum. Genitourinary: Negative for dysuria, urgency, frequency and hematuria.  Musculoskeletal: Negative for myalgias, back pain and joint pain.  Skin: Negative for itching and rash.  Neurological: Negative for dizziness, tremors, focal weakness, seizures and loss of consciousness.  Endo/Heme/Allergies: Negative for environmental allergies.  Psychiatric/Behavioral: Negative for depression, suicidal ideas and hallucinations.  All other systems reviewed and are negative.  Physical Exam: Blood pressure 130/80, pulse 73, height 5' 7.5" (1.715 m), weight 195 lb 6.4 oz (88.6 kg), SpO2 97 %. Gen:      No acute distress HEENT:  EOMI, sclera anicteric Neck:      No masses; no thyromegaly Lungs:    Clear to auscultation bilaterally; normal respiratory effort CV:         Regular rate and rhythm; no murmurs Abd:      + bowel sounds; soft, non-tender; no palpable masses, no distension Ext:    No edema; adequate peripheral perfusion Skin:      Warm and dry; no rash Neuro: alert and oriented x 3 Psych: normal mood and affect  Data Reviewed: Chest x-ray 06/17/16-surgical clips, lungs are clear. CTA chest 11/13/16-no pulmonary embolism, no interstitial lung disease, infiltrate. Scattered subcentimeter pulmonary nodules up to 4 mm that is stable compared to 2016.  FENO 03/11/17- 18  Assessment:  Evaluation for chronic cough Suspicion for asthma is low. Suspect upper airway cough syndrome for postnasal drip and GERD. I'll try her on chlorphentermine 8 mg 3 times daily and dymista nasal spray. She'll increase her Protonix to 40 twice daily. We discussed getting a chest x-ray and PFTs but she would like to hold off for now. We'll reevaluate in one month's time.  Chronic fatigue, daytime sleepiness, snoring Suspect sleep apnea. Holding off sleep study for now per patient's wishes. Reevaluate at next visit.  Plan/Recommendations: - Try chlorpheniramine 8 mg tid and dymistsa nasal spray - Increase protonix to 40 mg bid - Try albuterol inhaler PRN  Follow up in 1 month.   Marshell Garfinkel MD [facility] Pager [phone] 03/11/2017, 9:57 AM  CC: Brunetta Jeans, PA-C

## 2017-03-11 NOTE — Patient Instructions (Addendum)
Please take chlorphentermine 8 mg 3 times daily and dymista nasal spray Increase Protonix to 40 mg twice daily We'll start albuterol inhaler as needed. Follow up in 1 month to reassess her symptoms.

## 2017-03-11 NOTE — Addendum Note (Signed)
Addended by: Lorretta Harp on: 03/11/2017 10:50 AM   Modules accepted: Orders

## 2017-03-18 DIAGNOSIS — R69 Illness, unspecified: Secondary | ICD-10-CM | POA: Diagnosis not present

## 2017-04-10 DIAGNOSIS — Z6831 Body mass index (BMI) 31.0-31.9, adult: Secondary | ICD-10-CM | POA: Diagnosis not present

## 2017-04-10 DIAGNOSIS — R030 Elevated blood-pressure reading, without diagnosis of hypertension: Secondary | ICD-10-CM | POA: Diagnosis not present

## 2017-04-10 DIAGNOSIS — M47812 Spondylosis without myelopathy or radiculopathy, cervical region: Secondary | ICD-10-CM | POA: Diagnosis not present

## 2017-04-10 DIAGNOSIS — M48062 Spinal stenosis, lumbar region with neurogenic claudication: Secondary | ICD-10-CM | POA: Diagnosis not present

## 2017-04-15 ENCOUNTER — Ambulatory Visit: Payer: Medicare HMO | Admitting: Pulmonary Disease

## 2017-04-15 DIAGNOSIS — L570 Actinic keratosis: Secondary | ICD-10-CM | POA: Diagnosis not present

## 2017-05-04 ENCOUNTER — Other Ambulatory Visit: Payer: Self-pay | Admitting: Physician Assistant

## 2017-05-04 DIAGNOSIS — E785 Hyperlipidemia, unspecified: Secondary | ICD-10-CM

## 2017-05-05 ENCOUNTER — Ambulatory Visit: Payer: Medicare HMO | Admitting: Internal Medicine

## 2017-05-07 DIAGNOSIS — Z6830 Body mass index (BMI) 30.0-30.9, adult: Secondary | ICD-10-CM | POA: Diagnosis not present

## 2017-05-07 DIAGNOSIS — M48061 Spinal stenosis, lumbar region without neurogenic claudication: Secondary | ICD-10-CM | POA: Diagnosis not present

## 2017-05-07 DIAGNOSIS — M48062 Spinal stenosis, lumbar region with neurogenic claudication: Secondary | ICD-10-CM | POA: Diagnosis not present

## 2017-05-07 DIAGNOSIS — M47812 Spondylosis without myelopathy or radiculopathy, cervical region: Secondary | ICD-10-CM | POA: Diagnosis not present

## 2017-05-07 DIAGNOSIS — I10 Essential (primary) hypertension: Secondary | ICD-10-CM | POA: Diagnosis not present

## 2017-05-15 NOTE — Progress Notes (Signed)
Office Visit Note  Patient: [name]             Date of Birth: 10-10-1945           MRN: [id number]             PCP: Brunetta Jeans, PA-C Referring: Delorse Limber Visit Date: 05/21/2017 Occupation: Retired from Therapist, art, currently a Armed forces operational officer.    Subjective:  Other (joint pain, stiffness )   History of Present Illness: [name] is a 71 y.o. female seen in consultation per request of her PCP. According to patient she has had lower back pain since she was 71 years old. She's been diagnosed with this disease of C-spine and lumbar spine. She had MRI recently done by Dr. Trenton Gammon recently which revealed DDD lumbar spine with spinal stenosis per patient. She's been experiencing increased pain in the last 9 months which she describes in her bilateral hands, bilateral hips and bilateral knee joints. She has noticed swelling in her bilateral knees and her bilateral lower extremities. She decreased her dose of Neurontin which improved swelling on her lower extremities. She has noticed intermittent swelling in her hands. She states the neck pain radiates into her left arm.  Activities of Daily Living:  Patient reports morning stiffness for 1 hour.   Patient Denies nocturnal pain.  Difficulty dressing/grooming: Denies Difficulty climbing stairs: Reports Difficulty getting out of chair: Reports Difficulty using hands for taps, buttons, cutlery, and/or writing: Reports   Review of Systems  Constitutional: Positive for fatigue. Negative for weakness.  HENT: Positive for mouth dryness. Negative for mouth sores and nose dryness.   Eyes: Positive for itching. Negative for redness and dryness.  Respiratory: Positive for cough. Negative for hemoptysis, shortness of breath and difficulty breathing.        Related to GER  Cardiovascular: Negative.  Negative for chest pain, palpitations, hypertension, irregular heartbeat and swelling in legs/feet.  Gastrointestinal:  Negative.  Negative for blood in stool, constipation and diarrhea.  Endocrine: Negative for increased urination.  Genitourinary: Negative for painful urination.  Musculoskeletal: Positive for arthralgias, joint pain, joint swelling, morning stiffness and muscle tenderness. Negative for myalgias, muscle weakness and myalgias.  Skin: Negative for color change, pallor, rash, hair loss, nodules/bumps, redness, skin tightness, ulcers and sensitivity to sunlight.  Neurological: Positive for dizziness (Followed by neurologist). Negative for numbness and headaches.  Hematological: Negative for swollen glands.  Psychiatric/Behavioral: Negative.  Negative for depressed mood and sleep disturbance. The patient is not nervous/anxious.     PMFS History:  Patient Active Problem List   Diagnosis Date Noted  . Peripheral edema 11/26/2016  . Annual physical exam 06/30/2016  . Medicare annual wellness visit, subsequent 06/30/2016  . Hx of adenomatous polyp of colon 07/03/2015  . 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 Genetic testing 06/29/2014  . Family history of breast cancer   . Family history of colon cancer   . Family history of pancreatic cancer   . Breast cancer, right breast ([place]) 05/18/2014  . Malignant neoplasm of upper-outer quadrant of left breast in female, estrogen receptor positive ([place]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  . 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 Peripheral neuropathy 01/01/2013  . Melanoma of upper arm ([place]) 02/10/2012  . S/P laparoscopic fundoplication 29/92/4268  . Depression 05/22/2010  . FATTY LIVER DISEASE 12/07/2009  . COAGULOPATHY 07/12/2008  . CHEST XRAY, ABNORMAL 01/04/2008  . Vitamin D deficiency 04/14/2007  . VITAMIN B12 DEFICIENCY 02/27/2007  .  Essential hypertension 02/27/2007  . GERD 02/27/2007  . Fibromyalgia 02/27/2007    Past Medical History:  Diagnosis Date  . Arthritis    "spine" (07/28/2014)  . Blood dyscrasia    bruises and bleed easily  . Breast  cancer, left breast ([place]) 07/28/2014  . Breast cancer, right breast ([place]) 1995  . Chronic back pain    "all over"  . Complication of anesthesia    went to sleep easily but hard to wake up up until elbow OR in 2010  . Depression   . Dyspnea    Normal Spirometry 03/2008 EF 65% BNP normal 11/2007  . Family history of breast cancer   . Family history of colon cancer   . Family history of pancreatic cancer   . Gastric polyps   . GERD (gastroesophageal reflux disease)   . History of chicken pox   . History of hiatal hernia   . Hx of adenomatous polyp of colon 07/03/2015  . Kidney stone    right kidney   . Lichen sclerosus   . Melanoma ([place]) 2010   "right elbow; [facility] at [facility]"  . Mild anxiety   . Vitamin B12 deficiency   . Vitamin D deficiency     Family History  Problem Relation Age of Onset  . Colon cancer Cousin 29       double first cousin  . Colon cancer Cousin 71       double first cousin  . Colon polyps Father        between 28-20  . Dementia Father   . CAD Father        CABG at age 47  . Stroke Mother   . Pancreatic cancer Brother 42  . Colon polyps Brother        between 10-20  . Cancer Paternal Uncle        NOS  . Anemia Paternal Grandfather        pernicious anemia  . Breast cancer Cousin 35       double first cousin  . Breast cancer Cousin 74       paternal cousin  . Stroke Other        F 1st degree relative 36, M 1st degree relative   Past Surgical History:  Procedure Laterality Date  . BREAST BIOPSY Left 04/2014  . BREAST RECONSTRUCTION WITH PLACEMENT OF TISSUE EXPANDER AND FLEX HD (ACELLULAR HYDRATED DERMIS) Left 07/28/2014   Procedure: LEFT BREAST RECONSTRUCTION PLACEMENT OF LEFT TISSUE EXPANDER ;  Surgeon: Crissie Reese, MD;  Location: [place];  Service: Plastics;  Laterality: Left;  . BREAST RECONSTRUCTION WITH PLACEMENT OF TISSUE EXPANDER AND FLEX HD (ACELLULAR HYDRATED DERMIS) Left 09/08/2014   Procedure: REMOVAL OF TISSUE EXPANDER FROM LEFT BREAST;   Surgeon: Crissie Reese, MD;  Location: [place];  Service: Plastics;  Laterality: Left;  . BUNIONECTOMY Bilateral 1970's  . COLONOSCOPY     Dr Sharlett Iles  . CYSTOSCOPY WITH RETROGRADE PYELOGRAM, URETEROSCOPY AND STENT PLACEMENT Right 06/09/2015   Procedure: CYSTOSCOPY WITH RIGHT RETROGRADE PYELOGRAM, RIGHT URETEROSCOPY AND RIGHT URTERAL STENT PLACEMENT;  Surgeon: Ardis Hughs, MD;  [facility];  Service: Urology;  Laterality: Right;  . ESOPHAGOGASTRODUODENOSCOPY    . HERNIA REPAIR    . HOLMIUM LASER APPLICATION Right 0/25/8527   Procedure: HOLMIUM LASER APPLICATION;  Surgeon: Ardis Hughs, MD;  [facility];  Service: Urology;  Laterality: Right;  . LATISSIMUS FLAP TO BREAST Left 09/08/2014   Procedure: LEFT LATISSIMUS FLAP TO  BREAST WITH SALINE IMPLANT FOR BREAST RECONSTRUCTION;  Surgeon: Crissie Reese, MD;  Location: [place];  Service: Plastics;  Laterality: Left;  Marland Kitchen MASTECTOMY Right 1996    chemotherapy. pt. states 13 lymph nodes were removed  . MASTECTOMY COMPLETE / SIMPLE W/ SENTINEL NODE BIOPSY Left 07/28/2014  . MASTECTOMY W/ SENTINEL NODE BIOPSY Left 07/28/2014   Procedure: LEFT MASTECTOMY WITH SENTINEL LYMPH NODE MAPPING;  Surgeon: Autumn Messing III, MD;  Location: [place];  Service: General;  Laterality: Left;  Marland Kitchen MELANOMA EXCISION Right 2010   From elbow-- [facility] at [facility]   . NISSEN FUNDOPLICATION  81/2751  . RECONSTRUCTION BREAST IMMEDIATE / DELAYED W/ TISSUE EXPANDER Left 07/28/2014  . TEMPOROMANDIBULAR JOINT SURGERY Bilateral 1987  . TONSILLECTOMY     Social History   Social History Narrative   She lives with husband.     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 She appears well-developed and well-nourished.  HENT:  Head: Normocephalic and atraumatic.  Eyes: Conjunctivae and EOM are normal.  Neck: Normal range of motion.  Cardiovascular: Normal rate, regular rhythm, normal heart sounds and intact distal pulses.  Pulmonary/Chest: Effort normal and breath sounds normal.  Abdominal: Soft. Bowel sounds are normal.  Lymphadenopathy:    She has no cervical adenopathy.  Neurological: She is alert and oriented to person, place, and time.  Skin: Skin is warm and dry. Capillary refill takes less than 2 seconds.  Psychiatric: She has a normal mood and affect. Her behavior is normal.  Nursing note and vitals reviewed.    Musculoskeletal Exam: C-spine and thoracic lumbar spine limited range of motion. Shoulder joints elbow joints are good range of motion. She is good range of motion of her bilateral wrist joints. She is some DIP PIP thickening but no synovitis was noted. Hip joints with good range of motion. She tenderness on palpation of bilateral trochanteric bursa consistent with trochanteric bursitis. She is crepitus with range of motion of her knee joints without any warmth swelling or effusion. Ankle joints MTPs PIPs with good range of motion with no synovitis.  CDAI Exam: No CDAI exam completed.    Investigation: No additional findings. CBC Latest Ref Rng & Units 01/13/2017 11/05/2016 08/14/2016  WBC 4.0 - 10.5 K/uL 7.5 6.5 5.5  Hemoglobin 12.0 - 15.0 g/dL 13.8 14.4 14.3  Hematocrit 36.0 - 46.0 % 40.3 42.4 41.9  Platelets 150.0 - 400.0 K/uL 196.0 178.0 188   CMP Latest Ref Rng & Units 11/25/2016 11/13/2016 11/05/2016  Glucose 70 - 99 mg/dL 100(H) 103(H) 95  BUN 6 - 23 mg/dL 15 17 17   Creatinine 0.40 - 1.20 mg/dL 0.88 0.87 0.96  Sodium 135 - 145 mEq/L 143 143 142  Potassium 3.5 - 5.1 mEq/L 3.7 3.7 3.9  Chloride 96 - 112 mEq/L 103 104 103  CO2 19 - 32 mEq/L 31 30 32  Calcium 8.4 - 10.5 mg/dL 9.6 9.7 9.9  Total Protein 6.0 - 8.3 g/dL  7.2 6.5 7.0  Total Bilirubin 0.2 - 1.2 mg/dL 0.6 0.6 0.6  Alkaline Phos 39 - 117 U/L 64  65 69  AST 0 - 37 U/L 30 26 24   ALT 0 - 35 U/L 28 20 20    Component     Latest Ref Rng & Units 01/13/2017  Sed Rate     0 - 30 mm/hr 14  RA Latex Turbid.     <49 IU/mL <67  Cyclic Citrullin Peptide Ab     Units <16   Imaging: Xr Hand 2 View Left  Result Date: 05/21/2017 Minimal PIP/DIP CMC joint narrowing was noted. No MCP intercarpal joint of radiocarpal joint space narrowing was noted. No erosive changes were noted. These x-rays are consistent with mild osteoarthritis of the hand.  Xr Hand 2 View Right  Result Date: 05/21/2017 Second and third MCP joint narrowing was noted. PIP/DIP joint space narrowing was noted. CMC narrowing was noted. No intercarpal radiocarpal joint space narrowing was noted. No erosive changes were noted. These x-rays are consistent with osteoarthritis. Narrowing of the MCP joint could be related to osteoarthritis or inflammatory arthritis.  Xr Knee 3 View Left  Result Date: 05/21/2017 Moderate medial compartment narrowing intercondylar osteophytes were noted. No chondrocalcinosis was noted. Moderate patellofemoral narrowing was noted. Impression: These findings are consistent with moderate osteoarthritis and moderate chondromalacia patella.  Xr Knee 3 View Right  Result Date: 05/21/2017 Moderate medial compartment narrowing intercondylar osteophytes were noted. No chondrocalcinosis was noted. Moderate patellofemoral narrowing was noted. Impression: These findings are consistent with moderate osteoarthritis and moderate chondromalacia patella.   Speciality Comments: No specialty comments available.    Procedures:  No procedures performed Allergies: Silodosin; Ace inhibitors; Buprenorphine hcl; Morphine and related; Codeine; Sulfonamide derivatives; and Telmisartan   Assessment / Plan:     Visit Diagnoses: Joint swelling: Patient gives history of intermittent  joint swelling. No swelling or synovitis was noted on examination today.  Pain in both hands -she has some DIP PIP thickening which is clinically he is consistent with osteoarthritis. I did not see any synovitis on examination today. Plan: XR Hand 2 View Right, XR Hand 2 View Left  Trochanteric bursitis of both hips: She had tenderness over bilateral trochanteric bursa consistent with trochanteric bursitis. A handout on IT band exercises was given.  Chronic pain of both knees -she gives history of intermittent swelling in her knee joints and discomfort. Do not see any warmth swelling or effusion on examination today. Plan: XR KNEE 3 VIEW RIGHT, XR KNEE 3 VIEW LEFT  DDD (degenerative disc disease), cervical: MRI report of the C-spine was reviewed which is consistent with this disease of C-spine.  Other fatigue: Secondary to insomnia.  DDD (degenerative disc disease), lumbar - With the spinal stenosis. Followed up by Dr. Trenton Gammon. I do not have MRI to be reviewed .  Other medical problems are listed as follows:  History of fibromyalgia  History of vitamin D deficiency  History of peripheral neuropathy - f/u by neurology.  History of depression  History of breast cancer - right 1996. left 2015  History of melanoma - 2010 right elbow  History of gastroesophageal reflux (GERD)  History of hypertension  History of peripheral edema  Hx of adenomatous polyp of colon    Orders: Orders Placed This Encounter  Procedures  . XR Hand 2 View Right  . XR Hand 2 View Left  . XR KNEE 3 VIEW RIGHT  . XR KNEE 3 VIEW LEFT  . Iron, TIBC and Ferritin Panel  . Uric acid   No orders of the defined types were placed in this encounter.  Face-to-face time spent with patient was 50 minutes. Greater than 50% of time was spent in counseling and coordination of care.  Follow-Up Instructions: Return for Osteoarthritis.   Bo Merino, MD  Note - This record has been created using Radio producer.  Chart creation errors have been sought, but may not always  have been located. Such creation errors do not reflect on  the standard of medical care.

## 2017-05-21 ENCOUNTER — Encounter: Payer: Self-pay | Admitting: Rheumatology

## 2017-05-21 ENCOUNTER — Ambulatory Visit (INDEPENDENT_AMBULATORY_CARE_PROVIDER_SITE_OTHER): Payer: Medicare HMO

## 2017-05-21 ENCOUNTER — Ambulatory Visit: Payer: Medicare HMO | Admitting: Rheumatology

## 2017-05-21 ENCOUNTER — Ambulatory Visit (INDEPENDENT_AMBULATORY_CARE_PROVIDER_SITE_OTHER): Payer: Self-pay

## 2017-05-21 VITALS — BP 151/82 | HR 75 | Resp 17 | Ht 67.0 in | Wt 200.0 lb

## 2017-05-21 DIAGNOSIS — M79641 Pain in right hand: Secondary | ICD-10-CM

## 2017-05-21 DIAGNOSIS — M25561 Pain in right knee: Secondary | ICD-10-CM | POA: Diagnosis not present

## 2017-05-21 DIAGNOSIS — M25562 Pain in left knee: Secondary | ICD-10-CM

## 2017-05-21 DIAGNOSIS — M79642 Pain in left hand: Secondary | ICD-10-CM

## 2017-05-21 DIAGNOSIS — M5030 Other cervical disc degeneration, unspecified cervical region: Secondary | ICD-10-CM

## 2017-05-21 DIAGNOSIS — Z8719 Personal history of other diseases of the digestive system: Secondary | ICD-10-CM

## 2017-05-21 DIAGNOSIS — Z853 Personal history of malignant neoplasm of breast: Secondary | ICD-10-CM | POA: Diagnosis not present

## 2017-05-21 DIAGNOSIS — R5383 Other fatigue: Secondary | ICD-10-CM

## 2017-05-21 DIAGNOSIS — Z8739 Personal history of other diseases of the musculoskeletal system and connective tissue: Secondary | ICD-10-CM

## 2017-05-21 DIAGNOSIS — Z8639 Personal history of other endocrine, nutritional and metabolic disease: Secondary | ICD-10-CM

## 2017-05-21 DIAGNOSIS — M2540 Effusion, unspecified joint: Secondary | ICD-10-CM | POA: Diagnosis not present

## 2017-05-21 DIAGNOSIS — Z8669 Personal history of other diseases of the nervous system and sense organs: Secondary | ICD-10-CM | POA: Diagnosis not present

## 2017-05-21 DIAGNOSIS — M7061 Trochanteric bursitis, right hip: Secondary | ICD-10-CM | POA: Diagnosis not present

## 2017-05-21 DIAGNOSIS — Z8659 Personal history of other mental and behavioral disorders: Secondary | ICD-10-CM

## 2017-05-21 DIAGNOSIS — Z87898 Personal history of other specified conditions: Secondary | ICD-10-CM

## 2017-05-21 DIAGNOSIS — G8929 Other chronic pain: Secondary | ICD-10-CM

## 2017-05-21 DIAGNOSIS — M7062 Trochanteric bursitis, left hip: Secondary | ICD-10-CM

## 2017-05-21 DIAGNOSIS — Z86010 Personal history of colonic polyps: Secondary | ICD-10-CM

## 2017-05-21 DIAGNOSIS — Z85820 Personal history of malignant melanoma of skin: Secondary | ICD-10-CM

## 2017-05-21 DIAGNOSIS — M5136 Other intervertebral disc degeneration, lumbar region: Secondary | ICD-10-CM

## 2017-05-21 DIAGNOSIS — Z8679 Personal history of other diseases of the circulatory system: Secondary | ICD-10-CM

## 2017-05-21 NOTE — Patient Instructions (Signed)
Hand Exercises Hand exercises can be helpful to almost anyone. These exercises can strengthen the hands, improve flexibility and movement, and increase blood flow to the hands. These results can make work and daily tasks easier. Hand exercises can be especially helpful for people who have joint pain from arthritis or have nerve damage from overuse (carpal tunnel syndrome). These exercises can also help people who have injured a hand. Most of these hand exercises are fairly gentle stretching routines. You can do them often throughout the day. Still, it is a good idea to ask your health care provider which exercises would be best for you. Warming your hands before exercise may help to reduce stiffness. You can do this with gentle massage or by placing your hands in warm water for 15 minutes. Also, make sure you pay attention to your level of hand pain as you begin an exercise routine. Exercises Knuckle Bend Repeat this exercise 5-10 times with each hand. 1. Stand or sit with your arm, hand, and all five fingers pointed straight up. Make sure your wrist is straight. 2. Gently and slowly bend your fingers down and inward until the tips of your fingers are touching the tops of your palm. 3. Hold this position for a few seconds. 4. Extend your fingers out to their original position, all pointing straight up again.  Finger Fan Repeat this exercise 5-10 times with each hand. 1. Hold your arm and hand out in front of you. Keep your wrist straight. 2. Squeeze your hand into a fist. 3. Hold this position for a few seconds. 4. Fan out, or spread apart, your hand and fingers as much as possible, stretching every joint fully.  Tabletop Repeat this exercise 5-10 times with each hand. 1. Stand or sit with your arm, hand, and all five fingers pointed straight up. Make sure your wrist is straight. 2. Gently and slowly bend your fingers at the knuckles where they meet the hand until your hand is making an  upside-down L shape. Your fingers should form a tabletop. 3. Hold this position for a few seconds. 4. Extend your fingers out to their original position, all pointing straight up again.  Making Os Repeat this exercise 5-10 times with each hand. 1. Stand or sit with your arm, hand, and all five fingers pointed straight up. Make sure your wrist is straight. 2. Make an O shape by touching your pointer finger to your thumb. Hold for a few seconds. Then open your hand wide. 3. Repeat this motion with each finger on your hand.  Table Spread Repeat this exercise 5-10 times with each hand. 1. Place your hand on a table with your palm facing down. Make sure your wrist is straight. 2. Spread your fingers out as much as possible. Hold this position for a few seconds. 3. Slide your fingers back together again. Hold for a few seconds.  Ball Grip  Repeat this exercise 10-15 times with each hand. 1. Hold a tennis ball or another soft ball in your hand. 2. While slowly increasing pressure, squeeze the ball as hard as possible. 3. Squeeze as hard as you can for 3-5 seconds. 4. Relax and repeat.  Wrist Curls Repeat this exercise 10-15 times with each hand. 1. Sit in a chair that has armrests. 2. Hold a light weight in your hand, such as a dumbbell that weighs 1-3 pounds (0.5-1.4 kg). Ask your health care provider what weight would be best for you. 3. Rest your hand just over   the end of the chair arm with your palm facing up. 4. Gently pivot your wrist up and down while holding the weight. Do not twist your wrist from side to side.  Contact a health care provider if:  Your hand pain or discomfort gets much worse when you do an exercise.  Your hand pain or discomfort does not improve within 2 hours after you exercise. If you have any of these problems, stop doing these exercises right away. Do not do them again unless your health care provider says that you can. Get help right away if:  You  develop sudden, severe hand pain. If this happens, stop doing these exercises right away. Do not do them again unless your health care provider says that you can. This information is not intended to replace advice given to you by your health care provider. Make sure you discuss any questions you have with your health care provider. Document Released: 04/24/2015 Document Revised: 10/19/2015 Document Reviewed: 11/21/2014 Elsevier Interactive Patient Education  2018 [employer] Iliotibial Band Syndrome Rehab Ask your health care provider which exercises are safe for you. Do exercises exactly as told by your health care provider and adjust them as directed. It is normal to feel mild stretching, pulling, tightness, or discomfort as you do these exercises, but you should stop right away if you feel sudden pain or your pain gets worse.Do not begin these exercises until told by your health care provider. Stretching and range of motion exercises These exercises warm up your muscles and joints and improve the movement and flexibility of your hip and pelvis. Exercise A: Quadriceps, prone  1. Lie on your abdomen on a firm surface, such as a bed or padded floor. 2. Bend your left / right knee and hold your ankle. If you cannot reach your ankle or pant leg, loop a belt around your foot and grab the belt instead. 3. Gently pull your heel toward your buttocks. Your knee should not slide out to the side. You should feel a stretch in the front of your thigh and knee. 4. Hold this position for __________ seconds. Repeat __________ times. Complete this stretch __________ times a day. Exercise B: Iliotibial band  1. Lie on your side with your left / right leg in the top position. 2. Bend both of your knees and grab your left / right ankle. Stretch out your bottom arm to help you balance. 3. Slowly bring your top knee back so your thigh goes behind your trunk. 4. Slowly lower your top leg toward the floor until  you feel a gentle stretch on the outside of your left / right hip and thigh. If you do not feel a stretch and your knee will not fall farther, place the heel of your other foot on top of your knee and pull your knee down toward the floor with your foot. 5. Hold this position for __________ seconds. Repeat __________ times. Complete this stretch __________ times a day. Strengthening exercises These exercises build strength and endurance in your hip and pelvis. Endurance is the ability to use your muscles for a long time, even after they get tired. Exercise C: Straight leg raises ( hip abductors) 1. Lie on your side with your left / right leg in the top position. Lie so your head, shoulder, knee, and hip line up. You may bend your bottom knee to help you balance. 2. Roll your hips slightly forward so your hips are stacked directly over each other and   your left / right knee is facing forward. 3. Tense the muscles in your outer thigh and lift your top leg 4-6 inches (10-15 cm). 4. Hold this position for __________ seconds. 5. Slowly return to the starting position. Let your muscles relax completely before doing another repetition. Repeat __________ times. Complete this exercise __________ times a day. Exercise D: Straight leg raises ( hip extensors) 1. Lie on your abdomen on your bed or a firm surface. You can put a pillow under your hips if that is more comfortable. 2. Bend your left / right knee so your foot is straight up in the air. 3. Squeeze your buttock muscles and lift your left / right thigh off the bed. Do not let your back arch. 4. Tense this muscle as hard as you can without increasing any knee pain. 5. Hold this position for __________ seconds. 6. Slowly lower your leg to the starting position and allow it to relax completely. Repeat __________ times. Complete this exercise __________ times a day. Exercise E: Hip hike 1. Stand sideways on a bottom step. Stand on your left / right leg  with your other foot unsupported next to the step. You can hold onto the railing or wall if needed for balance. 2. Keep your knees straight and your torso square. Then, lift your left / right hip up toward the ceiling. 3. Slowly let your left / right hip lower toward the floor, past the starting position. Your foot should get closer to the floor. Do not lean or bend your knees. Repeat __________ times. Complete this exercise __________ times a day. This information is not intended to replace advice given to you by your health care provider. Make sure you discuss any questions you have with your health care provider. Document Released: 05/13/2005 Document Revised: 01/16/2016 Document Reviewed: 04/14/2015 Elsevier Interactive Patient Education  2018 Elsevier Inc.  

## 2017-05-22 ENCOUNTER — Other Ambulatory Visit: Payer: Self-pay | Admitting: Physician Assistant

## 2017-05-22 LAB — IRON,TIBC AND FERRITIN PANEL
%SAT: 29 % (ref 11–50)
FERRITIN: 42 ng/mL (ref 20–288)
IRON: 92 ug/dL (ref 45–160)
TIBC: 321 ug/dL (ref 250–450)

## 2017-05-22 LAB — URIC ACID: Uric Acid, Serum: 5.2 mg/dL (ref 2.5–7.0)

## 2017-05-22 NOTE — Progress Notes (Signed)
Will discuss at follow-up visit. All labs are normal.

## 2017-06-16 ENCOUNTER — Ambulatory Visit: Payer: Medicare HMO | Admitting: Cardiovascular Disease

## 2017-06-17 ENCOUNTER — Ambulatory Visit: Payer: Medicare Other | Admitting: Rheumatology

## 2017-06-25 DIAGNOSIS — R82998 Other abnormal findings in urine: Secondary | ICD-10-CM | POA: Diagnosis not present

## 2017-06-25 DIAGNOSIS — Z01419 Encounter for gynecological examination (general) (routine) without abnormal findings: Secondary | ICD-10-CM | POA: Diagnosis not present

## 2017-06-25 DIAGNOSIS — L900 Lichen sclerosus et atrophicus: Secondary | ICD-10-CM | POA: Diagnosis not present

## 2017-06-25 DIAGNOSIS — N952 Postmenopausal atrophic vaginitis: Secondary | ICD-10-CM | POA: Diagnosis not present

## 2017-07-09 DIAGNOSIS — M5030 Other cervical disc degeneration, unspecified cervical region: Secondary | ICD-10-CM

## 2017-07-09 DIAGNOSIS — M170 Bilateral primary osteoarthritis of knee: Secondary | ICD-10-CM

## 2017-07-09 DIAGNOSIS — M5136 Other intervertebral disc degeneration, lumbar region: Secondary | ICD-10-CM

## 2017-07-09 DIAGNOSIS — M19041 Primary osteoarthritis, right hand: Secondary | ICD-10-CM | POA: Insufficient documentation

## 2017-07-09 DIAGNOSIS — M51369 Other intervertebral disc degeneration, lumbar region without mention of lumbar back pain or lower extremity pain: Secondary | ICD-10-CM

## 2017-07-09 DIAGNOSIS — M19042 Primary osteoarthritis, left hand: Principal | ICD-10-CM

## 2017-07-09 HISTORY — DX: Other intervertebral disc degeneration, lumbar region without mention of lumbar back pain or lower extremity pain: M51.369

## 2017-07-09 HISTORY — DX: Other cervical disc degeneration, unspecified cervical region: M50.30

## 2017-07-09 HISTORY — DX: Other intervertebral disc degeneration, lumbar region: M51.36

## 2017-07-09 HISTORY — DX: Primary osteoarthritis, right hand: M19.041

## 2017-07-09 HISTORY — DX: Bilateral primary osteoarthritis of knee: M17.0

## 2017-07-09 NOTE — Progress Notes (Signed)
Office Visit Note  Patient: [name]             Date of Birth: Jul 24, 1945           MRN: [id number]             PCP: Delorse Limber Referring: Brunetta Jeans, PA-C Visit Date: 07/17/2017 Occupation: @GUAROCC @    Subjective:  Generalized pain   History of Present Illness: [name] is a 72 y.o. female with history of osteoarthritis, disc disease and fibromyalgia syndrome.  According to her she has been experiencing increased pain all over.  She describes pain in her neck, shoulders, arms and her lower extremities.  She has a rash on her bilateral ankles for which she was seen by dermatologist yesterday.  According to patient it was diagnosed as eczema.  Activities of Daily Living:  Patient reports morning stiffness for 1 hour.   Patient Reports nocturnal pain.  Difficulty dressing/grooming: Denies Difficulty climbing stairs: Reports Difficulty getting out of chair: Reports Difficulty using hands for taps, buttons, cutlery, and/or writing: Reports   Review of Systems  Constitutional: Positive for fatigue. Negative for night sweats, weight gain, weight loss and weakness.  HENT: Negative for mouth sores, trouble swallowing, trouble swallowing, mouth dryness and nose dryness.   Eyes: Negative for pain, redness, visual disturbance and dryness.  Respiratory: Negative for cough, shortness of breath and difficulty breathing.   Cardiovascular: Negative for chest pain, palpitations, hypertension, irregular heartbeat and swelling in legs/feet.  Gastrointestinal: Positive for nausea. Negative for blood in stool, constipation and diarrhea.  Endocrine: Negative for excessive thirst and increased urination.  Genitourinary: Negative for pelvic pain and vaginal dryness.  Musculoskeletal: Positive for arthralgias, joint pain and morning stiffness. Negative for joint swelling, myalgias, muscle weakness, muscle tenderness and myalgias.  Skin: Positive for rash. Negative for  color change, hair loss, redness, skin tightness, ulcers and sensitivity to sunlight.       eczema  Allergic/Immunologic: Negative for susceptible to infections.  Neurological: Negative for dizziness, light-headedness, numbness, headaches, memory loss and night sweats.  Hematological: Negative for bruising/bleeding tendency and swollen glands.  Psychiatric/Behavioral: Negative for depressed mood, confusion and sleep disturbance. The patient is not nervous/anxious.     PMFS History:  Patient Active Problem List   Diagnosis Date Noted  . Primary osteoarthritis of both hands 07/09/2017  . Primary osteoarthritis of both knees 07/09/2017  . DDD (degenerative disc disease), cervical 07/09/2017  . DDD (degenerative disc disease), lumbar 07/09/2017  . Peripheral edema 11/26/2016  . Annual physical exam 06/30/2016  . Medicare annual wellness visit, subsequent 06/30/2016  . Hx of adenomatous polyp of colon 07/03/2015  . 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 Genetic testing 06/29/2014  . Family history of breast cancer   . Family history of colon cancer   . Family history of pancreatic cancer   . Breast cancer, right breast ([place]) 05/18/2014  . Malignant neoplasm of upper-outer quadrant of left breast in female, estrogen receptor positive ([place]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  . 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 Peripheral neuropathy 01/01/2013  . Melanoma of upper arm ([place]) 02/10/2012  . S/P laparoscopic fundoplication 61/60/7371  . Depression 05/22/2010  . FATTY LIVER DISEASE 12/07/2009  . COAGULOPATHY 07/12/2008  . CHEST XRAY, ABNORMAL 01/04/2008  . Vitamin D deficiency 04/14/2007  . VITAMIN B12 DEFICIENCY 02/27/2007  . Essential hypertension 02/27/2007  . GERD 02/27/2007  . Fibromyalgia 02/27/2007    Past Medical History:  Diagnosis Date  .  Arthritis    "spine" (07/28/2014)  . Blood dyscrasia    bruises and bleed easily  . Breast cancer, left breast ([place]) 07/28/2014  . Breast cancer,  right breast ([place]) 1995  . Chronic back pain    "all over"  . Complication of anesthesia    went to sleep easily but hard to wake up up until elbow OR in 2010  . Depression   . Dyspnea    Normal Spirometry 03/2008 EF 65% BNP normal 11/2007  . Family history of breast cancer   . Family history of colon cancer   . Family history of pancreatic cancer   . Gastric polyps   . GERD (gastroesophageal reflux disease)   . History of chicken pox   . History of hiatal hernia   . Hx of adenomatous polyp of colon 07/03/2015  . Kidney stone    right kidney   . Lichen sclerosus   . Melanoma ([place]) 2010   "right elbow; [facility]"  . Mild anxiety   . Vitamin B12 deficiency   . Vitamin D deficiency     Family History  Problem Relation Age of Onset  . Colon cancer Cousin 20       double first cousin  . Colon cancer Cousin 57       double first cousin  . Colon polyps Father        between 68-20  . Dementia Father   . CAD Father        CABG at age 83  . Stroke Mother   . Pancreatic cancer Brother 13  . Colon polyps Brother        between 10-20  . Cancer Paternal Uncle        NOS  . Anemia Paternal Grandfather        pernicious anemia  . Breast cancer Cousin 33       double first cousin  . Breast cancer Cousin 74       paternal cousin  . Stroke Other        F 1st degree relative 72, M 1st degree relative   Past Surgical History:  Procedure Laterality Date  . BREAST BIOPSY Left 04/2014  . BREAST RECONSTRUCTION WITH PLACEMENT OF TISSUE EXPANDER AND FLEX HD (ACELLULAR HYDRATED DERMIS) Left 07/28/2014   Procedure: LEFT BREAST RECONSTRUCTION PLACEMENT OF LEFT TISSUE EXPANDER ;  Surgeon: Crissie Reese, MD;  Location: [place];  Service: Plastics;  Laterality: Left;  . BREAST RECONSTRUCTION WITH PLACEMENT OF TISSUE EXPANDER AND FLEX HD (ACELLULAR HYDRATED DERMIS) Left 09/08/2014   Procedure: REMOVAL OF TISSUE EXPANDER FROM LEFT BREAST;  Surgeon: Crissie Reese, MD;  Location: [place];  Service:  Plastics;  Laterality: Left;  . BUNIONECTOMY Bilateral 1970's  . COLONOSCOPY     Dr Sharlett Iles  . CYSTOSCOPY WITH RETROGRADE PYELOGRAM, URETEROSCOPY AND STENT PLACEMENT Right 06/09/2015   Procedure: CYSTOSCOPY WITH RIGHT RETROGRADE PYELOGRAM, RIGHT URETEROSCOPY AND RIGHT URTERAL STENT PLACEMENT;  Surgeon: Ardis Hughs, MD;  Location: [facility];  Service: Urology;  Laterality: Right;  . ESOPHAGOGASTRODUODENOSCOPY    . HERNIA REPAIR    . HOLMIUM LASER APPLICATION Right 0/12/6759   Procedure: HOLMIUM LASER APPLICATION;  Surgeon: Ardis Hughs, MD;  Location: [facility];  Service: Urology;  Laterality: Right;  . LATISSIMUS FLAP TO BREAST Left 09/08/2014   Procedure: LEFT LATISSIMUS FLAP TO BREAST WITH SALINE IMPLANT FOR BREAST RECONSTRUCTION;  Surgeon: Crissie Reese, MD;  Location[place];  Service: Plastics;  Laterality: Left;  .  MASTECTOMY Right 1996    chemotherapy. pt. states 13 lymph nodes were removed  . MASTECTOMY COMPLETE / SIMPLE W/ SENTINEL NODE BIOPSY Left 07/28/2014  . MASTECTOMY W/ SENTINEL NODE BIOPSY Left 07/28/2014   Procedure: LEFT MASTECTOMY WITH SENTINEL LYMPH NODE MAPPING;  Surgeon: Autumn Messing III, MD;  Location: [place];  Service: General;  Laterality: Left;  Marland Kitchen MELANOMA EXCISION Right 2010   From elbow-- [facility]   . NISSEN FUNDOPLICATION  67/1245  . RECONSTRUCTION BREAST IMMEDIATE / DELAYED W/ TISSUE EXPANDER Left 07/28/2014  . TEMPOROMANDIBULAR JOINT SURGERY Bilateral 1987  . TONSILLECTOMY     Social History   Social History Narrative   She lives with husband.     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 She appears well-developed and  well-nourished.  HENT:  Head: Normocephalic and atraumatic.  Eyes: Conjunctivae and EOM are normal.  Neck: Normal range of motion.  Cardiovascular: Normal rate, regular rhythm, normal heart sounds and intact distal pulses.  Pulmonary/Chest: Effort normal and breath sounds normal.  Abdominal: Soft. Bowel sounds are normal.  Lymphadenopathy:    She has no cervical adenopathy.  Neurological: She is alert and oriented to person, place, and time.  Skin: Skin is warm and dry. Capillary refill takes less than 2 seconds.  Psychiatric: She has a normal mood and affect. Her behavior is normal.  Nursing note and vitals reviewed.    Musculoskeletal Exam: C-spine thoracic lumbar spine Limited range of motion with discomfort.  She had bilateral trapezius spasm more so on the right side.  Shoulder joints elbows joints wrist joint MCPs PIPs DIPs with good range of motion.  She has DIP PIP thickening in her hands consistent with osteoarthritis.  She has crepitus in her bilateral knee joints without any warmth swelling or effusion.  She has tenderness over bilateral trochanteric bursa area consistent with trochanteric bursitis.  CDAI Exam: No CDAI exam completed.    Investigation: No additional findings.  Component     Latest Ref Rng & Units 01/13/2017 05/21/2017  Iron     45 - 160 mcg/dL  92  TIBC     250 - 450 mcg/dL (calc)  321  %SAT     11 - 50 % (calc)  29  Ferritin     20 - 288 ng/mL  42  Sed Rate     0 - 30 mm/hr 14   RA Latex Turbid.     <80 IU/mL <99   Cyclic Citrullin Peptide Ab     Units <16   Uric Acid, Serum     2.5 - 7.0 mg/dL  5.2   CBC Latest Ref Rng & Units 01/13/2017 11/05/2016 08/14/2016  WBC 4.0 - 10.5 K/uL 7.5 6.5 5.5  Hemoglobin 12.0 - 15.0 g/dL 13.8 14.4 14.3  Hematocrit 36.0 - 46.0 % 40.3 42.4 41.9  Platelets 150.0 - 400.0 K/uL 196.0 178.0 188   CMP Latest Ref Rng & Units 11/25/2016 11/13/2016 11/05/2016  Glucose 70 - 99 mg/dL 100(H) 103(H) 95  BUN 6 - 23 mg/dL 15 17  17  Creatinine 0.40 - 1.20 mg/dL 0.88 0.87 0.96  Sodium 135 - 145 mEq/L 143 143 142  Potassium 3.5 - 5.1 mEq/L 3.7 3.7 3.9  Chloride 96 - 112 mEq/L 103 104 103  CO2 19 - 32 mEq/L 31 30 32  Calcium 8.4 - 10.5 mg/dL 9.6 9.7 9.9  Total Protein 6.0 - 8.3 g/dL 7.2 6.5 7.0  Total Bilirubin 0.2 - 1.2 mg/dL 0.6 0.6 0.6  Alkaline Phos 39 - 117 U/L 64 65 69  AST 0 - 37 U/L 30 26 24   ALT 0 - 35 U/L 28 20 20     Imaging: No results found.  Speciality Comments: No specialty comments available.    Procedures:  Trigger Point Inj Date/Time: 07/17/2017 11:22 AM Performed by: Bo Merino, MD Authorized by: Bo Merino, MD   Consent Given by:  Patient Site marked: the procedure site was marked   Timeout: prior to procedure the correct patient, procedure, and site was verified   Indications:  Muscle spasm and pain Total # of Trigger Points:  1 Location: neck   Needle Size:  27 G Approach:  Dorsal Medications #1:  0.5 mL lidocaine 1 %; 10 mg triamcinolone acetonide 40 MG/ML Patient tolerance:  Patient tolerated the procedure well with no immediate complications   Allergies: Other; Silodosin; Ace inhibitors; Buprenorphine hcl; Morphine and related; Codeine; Sulfonamide derivatives; and Telmisartan   Assessment / Plan:     Visit Diagnoses: Primary osteoarthritis of both hands joint protection muscle strengthening discussed.  Primary osteoarthritis of both knees - Chondromalacia patella: She continues to have some discomfort in her knee joints.  Weight loss diet and exercise was discussed.  Trochanteric bursitis of both hips: I offered physical therapy which she declined.  I have given her a handout on trochanteric bursa exercises.  Neck pain: She has been having increased discomfort in the right trapezius area.  After different treatment options were discussed and informed consent was obtained right trapezius was injected with cortisone as described above.  DDD (degenerative disc  disease), cervical: Chronic pain  DDD (degenerative disc disease), lumbar - With spinal stenosis.  Dr. Trenton Gammon: Chronic pain  Fibromyalgia: She continues to have generalized pain and discomfort.  Other fatigue: Related to insomnia.  Vitamin D deficiency: She is on supplement.  Other medical problems are listed as follows:  Essential hypertension: Her blood pressure was elevated today.  Have advised to monitor blood pressure closely.  History of depression  Malignant melanoma of upper arm, unspecified laterality ([place])  History of breast cancer - Right-1996Left-2015  Peripheral edema  History of gastroesophageal reflux (GERD)    Orders: Orders Placed This Encounter  Procedures  . Trigger Point Inj   No orders of the defined types were placed in this encounter.   Face-to-face time spent with patient was 30 minutes. Greater than 50% of time was spent in counseling and coordination of care.  Follow-Up Instructions: Return in about 6 months (around 01/14/2018) for Osteoarthritis,DDD,  FMS.   Bo Merino, MD  Note - This record has been created using Editor, commissioning.  Chart creation errors have been sought, but may not always  have been located. Such creation errors do not reflect on  the standard of medical care.

## 2017-07-16 DIAGNOSIS — Z85820 Personal history of malignant melanoma of skin: Secondary | ICD-10-CM | POA: Diagnosis not present

## 2017-07-16 DIAGNOSIS — L309 Dermatitis, unspecified: Secondary | ICD-10-CM | POA: Diagnosis not present

## 2017-07-16 DIAGNOSIS — L814 Other melanin hyperpigmentation: Secondary | ICD-10-CM | POA: Diagnosis not present

## 2017-07-16 DIAGNOSIS — D229 Melanocytic nevi, unspecified: Secondary | ICD-10-CM | POA: Diagnosis not present

## 2017-07-16 DIAGNOSIS — L821 Other seborrheic keratosis: Secondary | ICD-10-CM | POA: Diagnosis not present

## 2017-07-17 ENCOUNTER — Ambulatory Visit: Payer: Medicare HMO | Admitting: Rheumatology

## 2017-07-17 ENCOUNTER — Encounter: Payer: Self-pay | Admitting: Rheumatology

## 2017-07-17 VITALS — BP 161/90 | HR 70 | Resp 17 | Ht 67.5 in | Wt 201.0 lb

## 2017-07-17 DIAGNOSIS — M170 Bilateral primary osteoarthritis of knee: Secondary | ICD-10-CM | POA: Diagnosis not present

## 2017-07-17 DIAGNOSIS — Z8719 Personal history of other diseases of the digestive system: Secondary | ICD-10-CM

## 2017-07-17 DIAGNOSIS — M542 Cervicalgia: Secondary | ICD-10-CM | POA: Diagnosis not present

## 2017-07-17 DIAGNOSIS — E559 Vitamin D deficiency, unspecified: Secondary | ICD-10-CM

## 2017-07-17 DIAGNOSIS — M19042 Primary osteoarthritis, left hand: Secondary | ICD-10-CM

## 2017-07-17 DIAGNOSIS — M7062 Trochanteric bursitis, left hip: Secondary | ICD-10-CM

## 2017-07-17 DIAGNOSIS — M19041 Primary osteoarthritis, right hand: Secondary | ICD-10-CM

## 2017-07-17 DIAGNOSIS — R609 Edema, unspecified: Secondary | ICD-10-CM

## 2017-07-17 DIAGNOSIS — M7061 Trochanteric bursitis, right hip: Secondary | ICD-10-CM | POA: Diagnosis not present

## 2017-07-17 DIAGNOSIS — I10 Essential (primary) hypertension: Secondary | ICD-10-CM | POA: Diagnosis not present

## 2017-07-17 DIAGNOSIS — R5383 Other fatigue: Secondary | ICD-10-CM

## 2017-07-17 DIAGNOSIS — M5030 Other cervical disc degeneration, unspecified cervical region: Secondary | ICD-10-CM | POA: Diagnosis not present

## 2017-07-17 DIAGNOSIS — R69 Illness, unspecified: Secondary | ICD-10-CM | POA: Diagnosis not present

## 2017-07-17 DIAGNOSIS — Z8659 Personal history of other mental and behavioral disorders: Secondary | ICD-10-CM

## 2017-07-17 DIAGNOSIS — Z853 Personal history of malignant neoplasm of breast: Secondary | ICD-10-CM | POA: Diagnosis not present

## 2017-07-17 DIAGNOSIS — C4360 Malignant melanoma of unspecified upper limb, including shoulder: Secondary | ICD-10-CM

## 2017-07-17 DIAGNOSIS — M5136 Other intervertebral disc degeneration, lumbar region: Secondary | ICD-10-CM | POA: Diagnosis not present

## 2017-07-17 DIAGNOSIS — M797 Fibromyalgia: Secondary | ICD-10-CM | POA: Diagnosis not present

## 2017-07-17 MED ORDER — LIDOCAINE HCL 1 % IJ SOLN
0.5000 mL | INTRAMUSCULAR | Status: AC | PRN
  Administered 2017-07-17: .5 mL

## 2017-07-17 MED ORDER — TRIAMCINOLONE ACETONIDE 40 MG/ML IJ SUSP
10.0000 mg | INTRAMUSCULAR | Status: AC | PRN
  Administered 2017-07-17: 10 mg via INTRAMUSCULAR

## 2017-07-17 NOTE — Patient Instructions (Signed)
Cervical Strain and Sprain Rehab Ask your health care provider which exercises are safe for you. Do exercises exactly as told by your health care provider and adjust them as directed. It is normal to feel mild stretching, pulling, tightness, or discomfort as you do these exercises, but you should stop right away if you feel sudden pain or your pain gets worse.Do not begin these exercises until told by your health care provider. Stretching and range of motion exercises These exercises warm up your muscles and joints and improve the movement and flexibility of your neck. These exercises also help to relieve pain, numbness, and tingling. Exercise A: Cervical side bend  1. Using good posture, sit on a stable chair or stand up. 2. Without moving your shoulders, slowly tilt your left / right ear to your shoulder until you feel a stretch in your neck muscles. You should be looking straight ahead. 3. Hold for __________ seconds. 4. Repeat with the other side of your neck. Repeat __________ times. Complete this exercise __________ times a day. Exercise B: Cervical rotation  1. Using good posture, sit on a stable chair or stand up. 2. Slowly turn your head to the side as if you are looking over your left / right shoulder. ? Keep your eyes level with the ground. ? Stop when you feel a stretch along the side and the back of your neck. 3. Hold for __________ seconds. 4. Repeat this by turning to your other side. Repeat __________ times. Complete this exercise __________ times a day. Exercise C: Thoracic extension and pectoral stretch 1. Roll a towel or a small blanket so it is about 4 inches (10 cm) in diameter. 2. Lie down on your back on a firm surface. 3. Put the towel lengthwise, under your spine in the middle of your back. It should not be not under your shoulder blades. The towel should line up with your spine from your middle back to your lower back. 4. Put your hands behind your head and let your  elbows fall out to your sides. 5. Hold for __________ seconds. Repeat __________ times. Complete this exercise __________ times a day. Strengthening exercises These exercises build strength and endurance in your neck. Endurance is the ability to use your muscles for a long time, even after your muscles get tired. Exercise D: Upper cervical flexion, isometric 1. Lie on your back with a thin pillow behind your head and a small rolled-up towel under your neck. 2. Gently tuck your chin toward your chest and nod your head down to look toward your feet. Do not lift your head off the pillow. 3. Hold for __________ seconds. 4. Release the tension slowly. Relax your neck muscles completely before you repeat this exercise. Repeat __________ times. Complete this exercise __________ times a day. Exercise E: Cervical extension, isometric  1. Stand about 6 inches (15 cm) away from a wall, with your back facing the wall. 2. Place a soft object, about 6-8 inches (15-20 cm) in diameter, between the back of your head and the wall. A soft object could be a small pillow, a ball, or a folded towel. 3. Gently tilt your head back and press into the soft object. Keep your jaw and forehead relaxed. 4. Hold for __________ seconds. 5. Release the tension slowly. Relax your neck muscles completely before you repeat this exercise. Repeat __________ times. Complete this exercise __________ times a day. Posture and body mechanics  Body mechanics refers to the movements and positions of   your body while you do your daily activities. Posture is part of body mechanics. Good posture and healthy body mechanics can help to relieve stress in your body's tissues and joints. Good posture means that your spine is in its natural S-curve position (your spine is neutral), your shoulders are pulled back slightly, and your head is not tipped forward. The following are general guidelines for applying improved posture and body mechanics to  your everyday activities. Standing  When standing, keep your spine neutral and keep your feet about hip-width apart. Keep a slight bend in your knees. Your ears, shoulders, and hips should line up.  When you do a task in which you stand in one place for a long time, place one foot up on a stable object that is 2-4 inches (5-10 cm) high, such as a footstool. This helps keep your spine neutral. Sitting   When sitting, keep your spine neutral and your keep feet flat on the floor. Use a footrest, if necessary, and keep your thighs parallel to the floor. Avoid rounding your shoulders, and avoid tilting your head forward.  When working at a desk or a computer, keep your desk at a height where your hands are slightly lower than your elbows. Slide your chair under your desk so you are close enough to maintain good posture.  When working at a computer, place your monitor at a height where you are looking straight ahead and you do not have to tilt your head forward or downward to look at the screen. Resting When lying down and resting, avoid positions that are most painful for you. Try to support your neck in a neutral position. You can use a contour pillow or a small rolled-up towel. Your pillow should support your neck but not push on it. This information is not intended to replace advice given to you by your health care provider. Make sure you discuss any questions you have with your health care provider. Document Released: 05/13/2005 Document Revised: 01/18/2016 Document Reviewed: 04/19/2015 Elsevier Interactive Patient Education  2018 [place] [facility] Syndrome Rehab Ask your health care provider which exercises are safe for you. Do exercises exactly as told by your health care provider and adjust them as directed. It is normal to feel mild stretching, pulling, tightness, or discomfort as you do these exercises, but you should stop right away if you feel sudden pain or your pain gets  worse.Do not begin these exercises until told by your health care provider. Stretching and range of motion exercises These exercises warm up your muscles and joints and improve the movement and flexibility of your hip and pelvis. Exercise A: Quadriceps, prone  1. Lie on your abdomen on a firm surface, such as a bed or padded floor. 2. Bend your left / right knee and hold your ankle. If you cannot reach your ankle or pant leg, loop a belt around your foot and grab the belt instead. 3. Gently pull your heel toward your buttocks. Your knee should not slide out to the side. You should feel a stretch in the front of your thigh and knee. 4. Hold this position for __________ seconds. Repeat __________ times. Complete this stretch __________ times a day. Exercise B: Iliotibial band  1. Lie on your side with your left / right leg in the top position. 2. Bend both of your knees and grab your left / right ankle. Stretch out your bottom arm to help you balance. 3. Slowly bring your top knee  back so your thigh goes behind your trunk. 4. Slowly lower your top leg toward the floor until you feel a gentle stretch on the outside of your left / right hip and thigh. If you do not feel a stretch and your knee will not fall farther, place the heel of your other foot on top of your knee and pull your knee down toward the floor with your foot. 5. Hold this position for __________ seconds. Repeat __________ times. Complete this stretch __________ times a day. Strengthening exercises These exercises build strength and endurance in your hip and pelvis. Endurance is the ability to use your muscles for a long time, even after they get tired. Exercise C: Straight leg raises ( hip abductors) 1. Lie on your side with your left / right leg in the top position. Lie so your head, shoulder, knee, and hip line up. You may bend your bottom knee to help you balance. 2. Roll your hips slightly forward so your hips are stacked  directly over each other and your left / right knee is facing forward. 3. Tense the muscles in your outer thigh and lift your top leg 4-6 inches (10-15 cm). 4. Hold this position for __________ seconds. 5. Slowly return to the starting position. Let your muscles relax completely before doing another repetition. Repeat __________ times. Complete this exercise __________ times a day. Exercise D: Straight leg raises ( hip extensors) 1. Lie on your abdomen on your bed or a firm surface. You can put a pillow under your hips if that is more comfortable. 2. Bend your left / right knee so your foot is straight up in the air. 3. Squeeze your buttock muscles and lift your left / right thigh off the bed. Do not let your back arch. 4. Tense this muscle as hard as you can without increasing any knee pain. 5. Hold this position for __________ seconds. 6. Slowly lower your leg to the starting position and allow it to relax completely. Repeat __________ times. Complete this exercise __________ times a day. Exercise E: Hip hike 1. Stand sideways on a bottom step. Stand on your left / right leg with your other foot unsupported next to the step. You can hold onto the railing or wall if needed for balance. 2. Keep your knees straight and your torso square. Then, lift your left / right hip up toward the ceiling. 3. Slowly let your left / right hip lower toward the floor, past the starting position. Your foot should get closer to the floor. Do not lean or bend your knees. Repeat __________ times. Complete this exercise __________ times a day. This information is not intended to replace advice given to you by your health care provider. Make sure you discuss any questions you have with your health care provider. Document Released: 05/13/2005 Document Revised: 01/16/2016 Document Reviewed: 04/14/2015 Elsevier Interactive Patient Education  [employer].

## 2017-07-23 DIAGNOSIS — R030 Elevated blood-pressure reading, without diagnosis of hypertension: Secondary | ICD-10-CM | POA: Diagnosis not present

## 2017-07-23 DIAGNOSIS — Z6831 Body mass index (BMI) 31.0-31.9, adult: Secondary | ICD-10-CM | POA: Diagnosis not present

## 2017-07-23 DIAGNOSIS — M47812 Spondylosis without myelopathy or radiculopathy, cervical region: Secondary | ICD-10-CM | POA: Diagnosis not present

## 2017-07-31 ENCOUNTER — Telehealth: Payer: Self-pay | Admitting: *Deleted

## 2017-07-31 NOTE — Telephone Encounter (Signed)
Patient called, she is going to pain management, she wants to know if she still needs to come in for her ultrasound visit. Please advise.

## 2017-08-01 NOTE — Telephone Encounter (Signed)
She may cancel her ultrasound appointment if she does not of any joint inflammation.

## 2017-08-04 DIAGNOSIS — M47896 Other spondylosis, lumbar region: Secondary | ICD-10-CM | POA: Diagnosis not present

## 2017-08-04 DIAGNOSIS — M4126 Other idiopathic scoliosis, lumbar region: Secondary | ICD-10-CM | POA: Diagnosis not present

## 2017-08-04 NOTE — Telephone Encounter (Signed)
I called patient, no joint swelling at this time, [place] appt canceled

## 2017-08-08 DIAGNOSIS — M4126 Other idiopathic scoliosis, lumbar region: Secondary | ICD-10-CM | POA: Diagnosis not present

## 2017-08-08 DIAGNOSIS — M5431 Sciatica, right side: Secondary | ICD-10-CM | POA: Diagnosis not present

## 2017-08-08 DIAGNOSIS — M47812 Spondylosis without myelopathy or radiculopathy, cervical region: Secondary | ICD-10-CM | POA: Diagnosis not present

## 2017-08-08 DIAGNOSIS — M47896 Other spondylosis, lumbar region: Secondary | ICD-10-CM | POA: Diagnosis not present

## 2017-08-13 ENCOUNTER — Other Ambulatory Visit: Payer: Medicare HMO | Admitting: Rheumatology

## 2017-09-09 ENCOUNTER — Inpatient Hospital Stay: Admission: RE | Admit: 2017-09-09 | Payer: Medicare HMO | Source: Ambulatory Visit

## 2017-09-25 ENCOUNTER — Ambulatory Visit: Payer: Medicare HMO | Admitting: Oncology

## 2017-09-25 ENCOUNTER — Other Ambulatory Visit: Payer: Medicare HMO

## 2017-09-29 DIAGNOSIS — M5431 Sciatica, right side: Secondary | ICD-10-CM | POA: Diagnosis not present

## 2017-10-03 IMAGING — US US EXTREM LOW VENOUS*L*
1 series · 13 of 24 positions shown · non-contrast
Comparison: None.

CLINICAL DATA: 70-year-old female with left lower extremity pain
and swelling for 2 weeks. Initial encounter.



[Series 1: us extrem low venous*left* · 0.09mm/px · 13 of 26 slices shown]
[im 1/26]
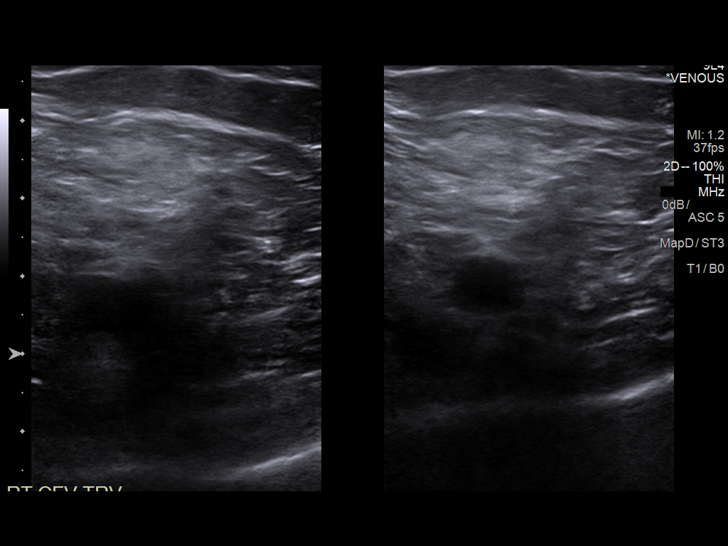
[im 3/26]
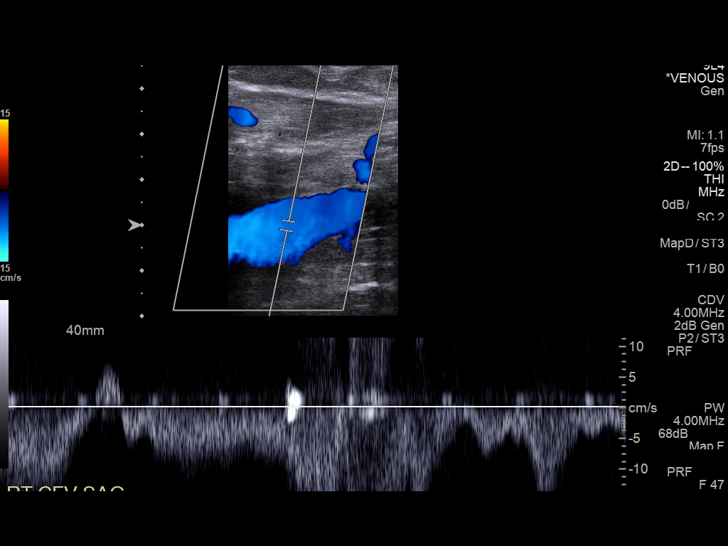
[im 5/26]
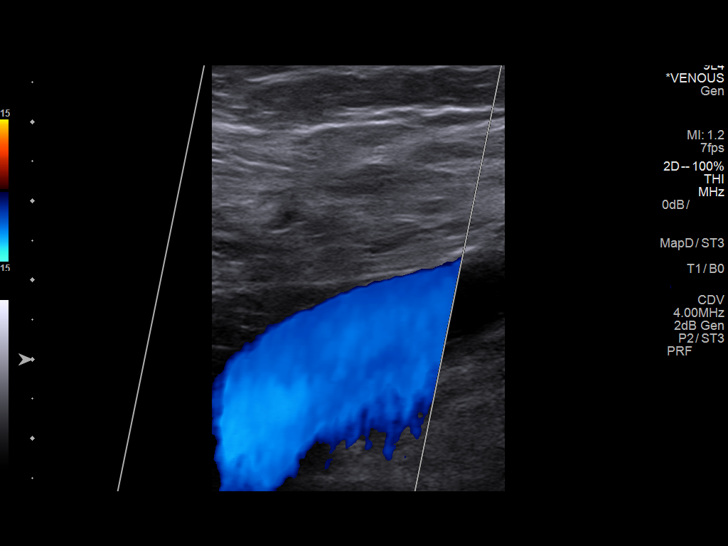
[im 7/26]
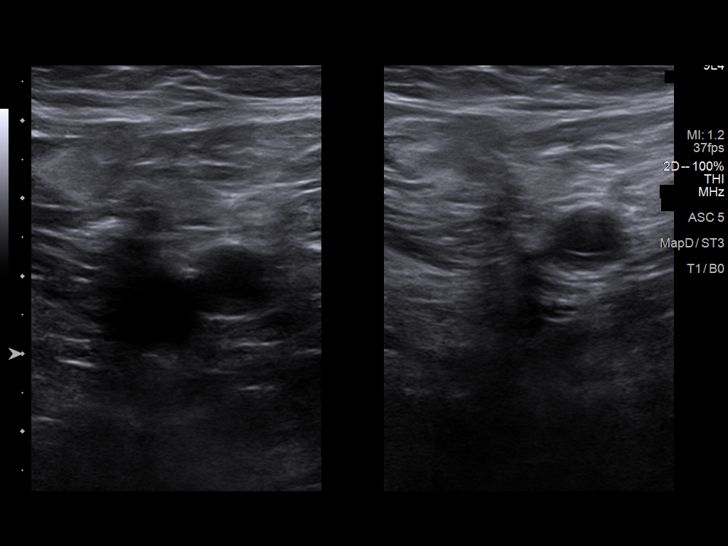
[im 9/26]
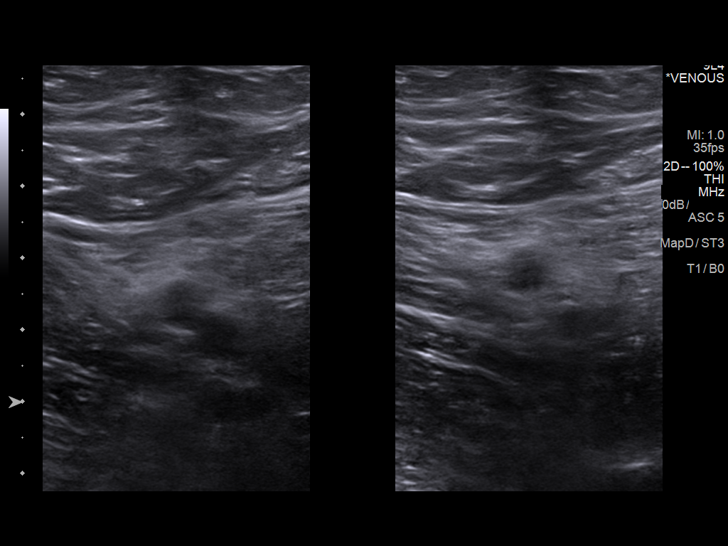
[im 11/26]
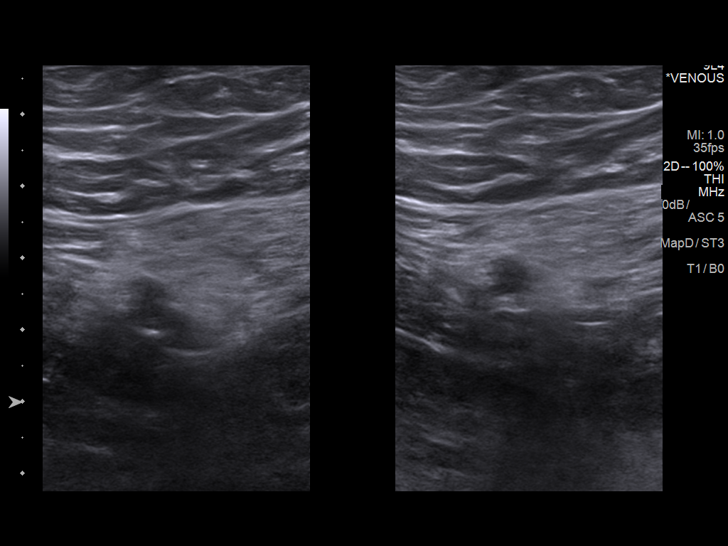
[im 14/26]
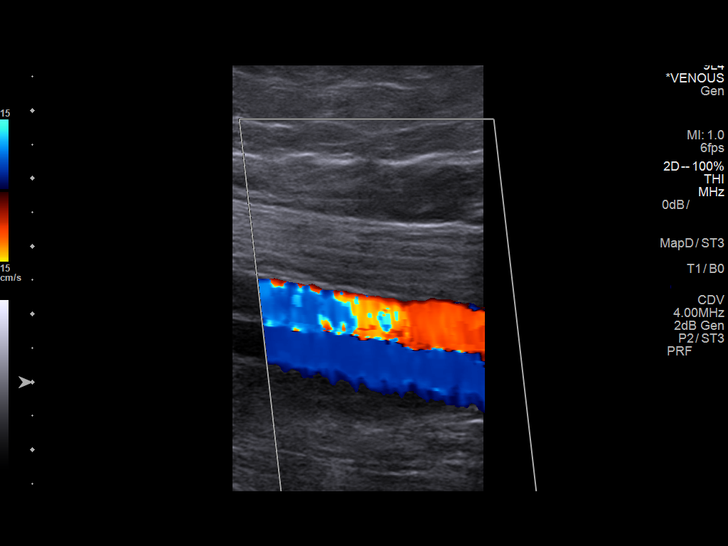
[im 15/26]
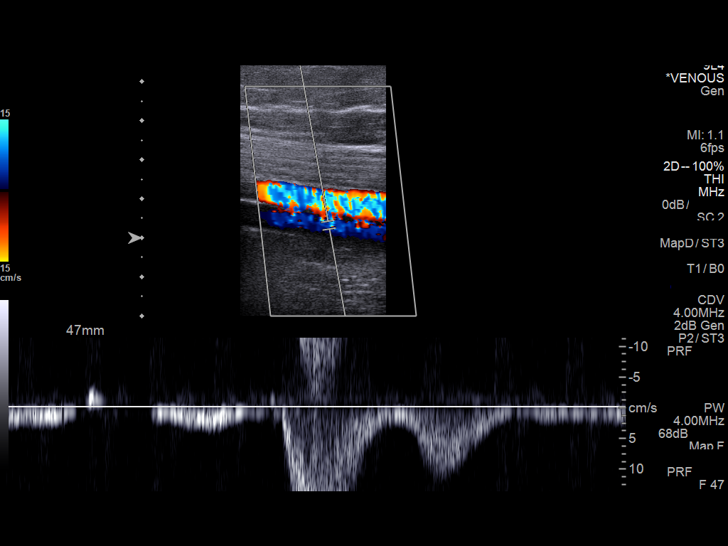
[im 17/26]
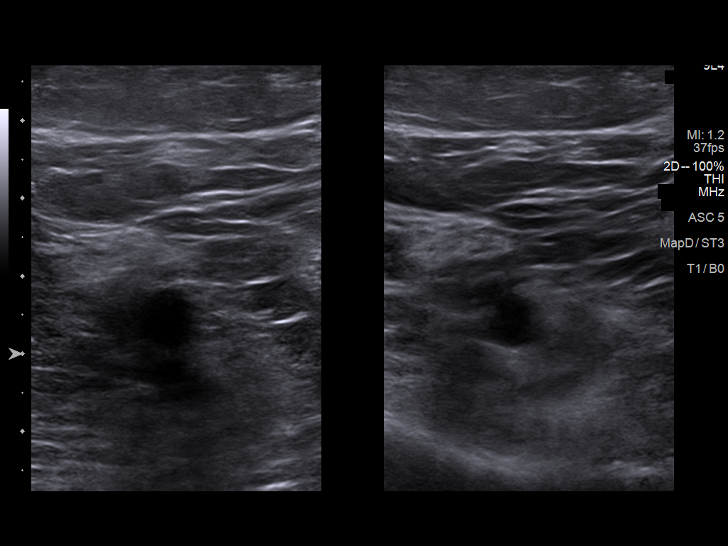
[im 19/26]
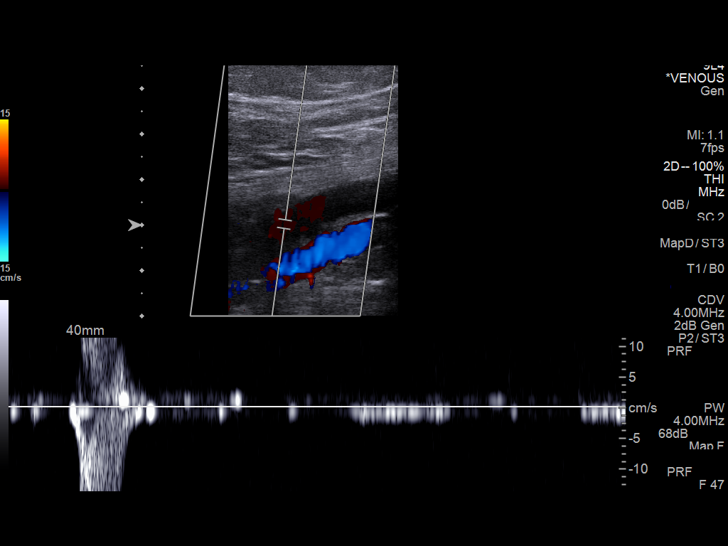
[im 21/26]
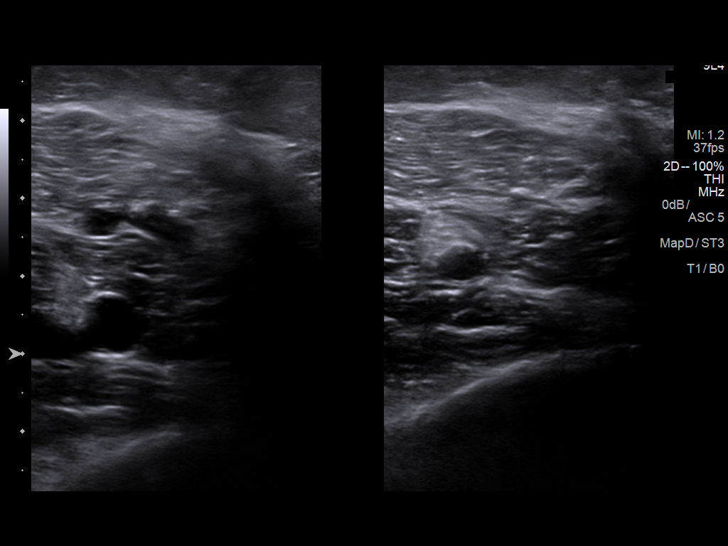
[im 23/26]
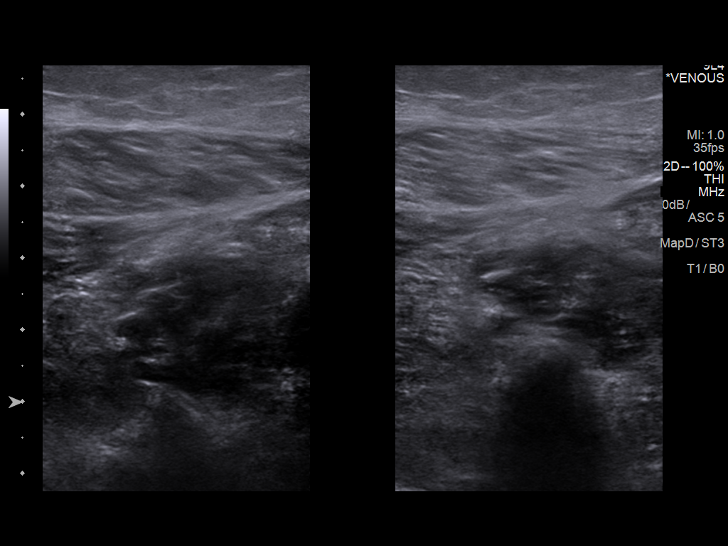
[im 26/26]
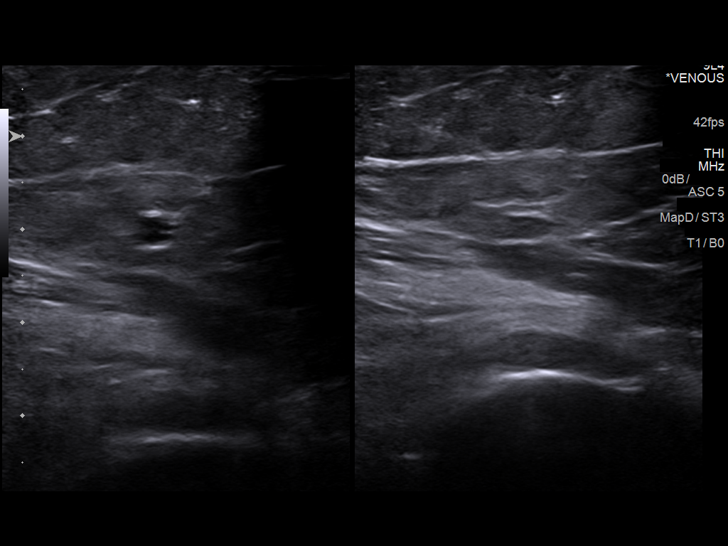

[13 of 24 positions shown; findings below may reference images not displayed]

FINDINGS: Contralateral Common Femoral Vein: Respiratory phasicity is normal
and symmetric with the symptomatic side. No evidence of thrombus.
Normal compressibility.

Common Femoral Vein: No evidence of thrombus. Normal
compressibility, respiratory phasicity and response to augmentation.

Saphenofemoral Junction: No evidence of thrombus. Normal
compressibility and flow on color Doppler imaging.

Profunda Femoral Vein: No evidence of thrombus. Normal
compressibility and flow on color Doppler imaging.

Femoral Vein: No evidence of thrombus. Normal compressibility,
respiratory phasicity and response to augmentation.

Popliteal Vein: No evidence of thrombus. Normal compressibility,
respiratory phasicity and response to augmentation.

Calf Veins: No evidence of thrombus. Normal compressibility and flow
on color Doppler imaging.

Superficial Great Saphenous Vein: No evidence of thrombus. Normal
compressibility and flow on color Doppler imaging.

Other Findings:  None.
IMPRESSION: No evidence of DVT within the left lower extremity.

## 2017-10-15 ENCOUNTER — Other Ambulatory Visit: Payer: Self-pay | Admitting: Physician Assistant

## 2017-10-15 DIAGNOSIS — F339 Major depressive disorder, recurrent, unspecified: Secondary | ICD-10-CM

## 2017-10-30 NOTE — Progress Notes (Signed)
[facility]  [phone]) [phone] [phone]) [phone]     ID: [name] DOB: 1945-11-10  MR#: [id number]  FAO#:[id number]  Patient Care Team: [name] as PCP - General (Physician Assistant) [name] Hector, [name] (Cardiology) [name] Fowler, [name] as Attending Physician (Obstetrics and Gynecology) [name], [name] Dad, [name] (Hematology and Oncology) [name] Mayer, [name] as Consulting Physician (Gastroenterology) [name], [place] as Referring Physician (Optometry) [name] Kussmaul, [name] as Consulting Physician (General Surgery) [name] Hughs, [name] as Attending Physician (Urology) [name] Capuchin, [name] as Consulting Physician (General Practice) PCP: [name] Jeans, PA-C GYN: SU: [name], [name] OTHER [name]: [name] M.D. , [name] M.D., [name] M.D., [name], [name] Mocha McDiarmid [name]  CHIEF COMPLAINT: Bilateral breast cancers  CURRENT TREATMENT: Anastrozole    Malignant neoplasm of upper-outer quadrant of left breast in female, estrogen receptor positive ([place])   05/09/2014 Initial Diagnosis    Left breast  upper-outer quadrant biopsy shows an invasive breast cancer with lobular features, grade 1, estrogen receptor 100% positive, progesterone receptor 100% positive, with an MIB-1 of 8% and no HER-2 amplification       Breast cancer ([place]) (Resolved)   09/08/2014 Initial Diagnosis    Breast cancer       HISTORY OF BREAST CANCER From the original intake note:  [name], now [name], was my patient in 1996, at which time she had an early stage breast cancer treated with mastectomy with TRAM flap reconstruction followed by CMF chemotherapy 8. She did not receive radiation or anti-estrogens. She was released from follow-up early this sensory.  More recently [name] had routine left screening mammography with tomography at the breast Center 04/27/2014. Breast density was category B. A possible mass in the left  breast was noted and on 05/09/2014 she underwent diagnostic left mammography and ultrasonography showing a 9 mm spiculated asymmetry in the left breast upper outer quadrant. This was not palpable. Ultrasound identified a 1.0 cm irregular hypoechoic lesion in that area. Ultrasound of the left axilla was negative.  Biopsy of the left breast mass 05/09/2014 showed ([id number]) an invasive breast cancer with lobular features, grade 1. The tumor was estrogen receptor 100% positive, progesterone receptor 99% positive, both with strong staining intensity. The MIB-1 was 8%. HER-2 was not amplified, the signals ratio being 1.10 and the number per cell 1.70.  On 05/17/2014 the patient underwent bilateral breast MRI. This showed breast composition category C. In the upper outer quadrant of the left breast there was stippled non-masslike enhancement measuring 3.9 cm. Within this region there was an area of slightly increased consolidation corresponding to the smaller mass noted on ultrasonography. There were no abnormal appearing lymph nodes in the right breast was unremarkable.  [name] case was presented at the breast cancer multidisciplinary conference 05/18/2014. The patient is a candidate for breast conservation, and if she opts for that her surgeon may wish to do a generous lumpectomy or proceed to biopsy of the more extensive area of stippled non-masslike enhancement. It was felt that since the patient had a mastectomy on the right she might choose a mastectomy on the left. An Oncotype test was also recommended.  [name] was evaluated at the breast clinic 05/18/2014. Her subsequent history is as detailed below  [name] returns today for follow-up of her estrogen receptor positive breast cancers accompanied by her daughter, [name]. Gwendolyn temporarily stopped taking anastrozole over the lats month. She has had  leg cramps and difficulty walking. She has had leg cramps and back pain over the last  year. She also  has spinal stenosis and gets shots under [facility]. Since stopping anastrozole, her pain has improved. She used to take tamoxifen in the past, but she is taking a medication for nerves that interferes with tamoxifen, so this was discontinued.    REVIEW OF SYSTEMS: [name] reports that her sister-in-law recently passed away with cancer at 43. She is worried about construction going on near her home. There is [place] near [place] home that she would rather look outside her window than construction buildings. She has felt down lately, and she hasn't been able to exercise. She joined the [employer] [place] and she enjoys walking. She walks for about 20-30 minutes, and she aims to walk more often.  She denies unusual headaches, visual changes, nausea, vomiting, or dizziness. There has been no unusual cough, phlegm production, or pleurisy. This been no change in bowel or bladder habits. She denies unexplained fatigue or unexplained weight loss, bleeding, rash, or fever. A detailed review of systems was otherwise stable.    Allergies  Allergen Reactions  . Other Anaphylaxis and Rash  . Silodosin Rash    Facial rash  . Ace Inhibitors Other (See Comments)    REACTION: angioedema  . Buprenorphine Hcl Other (See Comments)    "crazy"  . Morphine And Related Other (See Comments)    "crazy" "crazy"  . Codeine Rash  . Sulfonamide Derivatives Rash  . Telmisartan Rash    Current Outpatient Medications  Medication Sig Dispense Refill  . albuterol (PROVENTIL HFA;VENTOLIN HFA) 108 (90 Base) MCG/ACT inhaler Inhale 2 puffs into the lungs every 6 (six) hours as needed for wheezing or shortness of breath. (Patient not taking: Reported on 07/17/2017) 1 Inhaler 5  . anastrozole (ARIMIDEX) 1 MG tablet Take 1 tablet (1 mg total) by mouth daily. 90 tablet 4  . atorvastatin (LIPITOR) 10 MG tablet TAKE 1 TABLET BY MOUTH ONCE DAILY 90 tablet 1  . cetirizine (ZYRTEC ALLERGY) 10  MG tablet Take 10 mg by mouth as needed.     . Cholecalciferol (VITAMIN D3) 1000 units CAPS Take 1 capsule by mouth.    . Cholecalciferol (VITAMIN D3) 5000 units TABS Take by mouth daily.    . citalopram (CELEXA) 10 MG tablet TAKE 1 TABLET BY MOUTH ONCE DAILY 30 tablet 0  . cyanocobalamin 2000 MCG tablet Take 5,000 mcg by mouth daily.     . furosemide (LASIX) 40 MG tablet TAKE 1 TABLET BY MOUTH ONCE DAILY 90 tablet 1  . gabapentin (NEURONTIN) 100 MG capsule Take 1 capsule (100 mg total) by mouth 2 (two) times daily. (Patient not taking: Reported on 07/17/2017) 60 capsule 0  . meloxicam (MOBIC) 15 MG tablet Take 15 mg by mouth daily.    . naproxen sodium (ALEVE) 220 MG tablet Take 220 mg by mouth as needed.     . pantoprazole (PROTONIX) 40 MG tablet Take 1 tablet (40 mg total) by mouth 2 (two) times daily. 60 tablet 3  . Probiotic Product (PROBIOTIC DAILY) CAPS Take 1 capsule by mouth.    . vitamin E 400 UNIT capsule Take 400 Units by mouth daily.     No current facility-administered medications for this visit.     PAST MEDICAL HISTORY: Past Medical History:  Diagnosis Date  . Arthritis    "spine" (07/28/2014)  . Blood dyscrasia    bruises and  bleed easily  . Breast cancer, left breast ([place]) 07/28/2014  . Breast cancer, right breast ([place]) 1995  . Chronic back pain    "all over"  . Complication of anesthesia    went to sleep easily but hard to wake up up until elbow OR in 2010  . Depression   . Dyspnea    Normal Spirometry 03/2008 EF 65% BNP normal 11/2007  . Family history of breast cancer   . Family history of colon cancer   . Family history of pancreatic cancer   . Gastric polyps   . GERD (gastroesophageal reflux disease)   . History of chicken pox   . History of hiatal hernia   . Hx of adenomatous polyp of colon 07/03/2015  . Kidney stone    right kidney   . Lichen sclerosus   . Melanoma ([place]) 2010   "right elbow; [facility]"  . Mild anxiety   . Vitamin B12 deficiency     . Vitamin D deficiency     PAST SURGICAL HISTORY: Past Surgical History:  Procedure Laterality Date  . BREAST BIOPSY Left 04/2014  . BREAST RECONSTRUCTION WITH PLACEMENT OF TISSUE EXPANDER AND FLEX HD (ACELLULAR HYDRATED DERMIS) Left 07/28/2014   Procedure: LEFT BREAST RECONSTRUCTION PLACEMENT OF LEFT TISSUE EXPANDER ;  Surgeon: [name] Reese, [name];  Location: [facility];  Service: Plastics;  Laterality: Left;  . BREAST RECONSTRUCTION WITH PLACEMENT OF TISSUE EXPANDER AND FLEX HD (ACELLULAR HYDRATED DERMIS) Left 09/08/2014   Procedure: REMOVAL OF TISSUE EXPANDER FROM LEFT BREAST;  Surgeon: [name] Reese, [name];  Location: [facility];  Service: Plastics;  Laterality: Left;  . BUNIONECTOMY Bilateral 1970's  . COLONOSCOPY     Dr Sharlett Iles  . CYSTOSCOPY WITH RETROGRADE PYELOGRAM, URETEROSCOPY AND STENT PLACEMENT Right 06/09/2015   Procedure: CYSTOSCOPY WITH RIGHT RETROGRADE PYELOGRAM, RIGHT URETEROSCOPY AND RIGHT URTERAL STENT PLACEMENT;  Surgeon: [name] Hughs, [name];  Location: [facility];  Service: Urology;  Laterality: Right;  . ESOPHAGOGASTRODUODENOSCOPY    . HERNIA REPAIR    . HOLMIUM LASER APPLICATION Right 9/79/8921   Procedure: HOLMIUM LASER APPLICATION;  Surgeon: [name] Hughs, [name];  Location: [facility];  Service: Urology;  Laterality: Right;  . LATISSIMUS FLAP TO BREAST Left 09/08/2014   Procedure: LEFT LATISSIMUS FLAP TO BREAST WITH SALINE IMPLANT FOR BREAST RECONSTRUCTION;  Surgeon: [name] Reese, [name];  Location: [facility];  Service: Plastics;  Laterality: Left;  Marland Kitchen MASTECTOMY Right 1996    chemotherapy. pt. states 13 lymph nodes were removed  . MASTECTOMY COMPLETE / SIMPLE W/ SENTINEL NODE BIOPSY Left 07/28/2014  . MASTECTOMY W/ SENTINEL NODE BIOPSY Left 07/28/2014   Procedure: LEFT MASTECTOMY WITH SENTINEL LYMPH NODE MAPPING;  Surgeon: Autumn Messing III, [name];  Location: [facility];  Service: General;  Laterality: Left;  Marland Kitchen MELANOMA EXCISION Right 2010   From elbow-- Done at [facility]   . NISSEN FUNDOPLICATION  19/4174  .  RECONSTRUCTION BREAST IMMEDIATE / DELAYED W/ TISSUE EXPANDER Left 07/28/2014  . TEMPOROMANDIBULAR JOINT SURGERY Bilateral 1987  . TONSILLECTOMY      FAMILY HISTORY Family History  Problem Relation Age of Onset  . Colon cancer Cousin 85       double first cousin  . Colon cancer Cousin 26       double first cousin  . Colon polyps Father        between 57-20  . Dementia Father   . CAD Father        CABG at age 71  .  Stroke Mother   . Pancreatic cancer Brother 41  . Colon polyps Brother        between 10-20  . Cancer Paternal Uncle        NOS  . Anemia Paternal Grandfather        pernicious anemia  . Breast cancer Cousin 64       double first cousin  . Breast cancer Cousin 74       paternal cousin  . Stroke Other        F 1st degree relative 60, M 1st degree relative   the patient's father died at age 73 in the setting of Alzheimer's disease. The patient's mother died at age 49 following a stroke. Kamry has 3 brothers, no sisters. Deshayla's niece, [name], was my patient with a history of breast cancer diagnosed at age 40. She succumbed to that tumor. The patient also has 2 paternal cousins with breast cancer diagnosed at age 68 and age 30. One brother has prostate cancer diagnosed at age 28. The other brother has a history of throat cancer diagnosed at age 80. There is no other history of breast or ovarian cancer in the family to her knowledge.  GENETICS TESTING:  MUTYH mutation, normal BRCA genes  GYNECOLOGIC HISTORY:  No LMP recorded. (Menstrual status: Chemotherapy). Menarche age 40, first live birth age 32. She is GX P2. She went through menopause in 1996 at the time of her chemotherapy. She did not take hormone replacement. 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 Her husband of 3 years, [name], is a Gaffer at [employer]. He matches colors  for a [employer]. Jahzaria has 2 children from a prior marriage, [name], 72 years old, lives in [place] and is a homemaker; and [name], 66, who lives in [place] and works as a Freight forwarder. Markel has 4 biological grandchildren. [name] has a son, Mitzi Hansen, who lives in [place] and worse at [employer]. [name] has 4 grandchildren of his. The patient is a Psychologist, forensic.    ADVANCED DIRECTIVES: In place; currently Ilaisaane's children [name] and [name] are co- healthcare powers of attorney. [name] can be reached at [place]: Social History   Tobacco Use  . Smoking status: Never Smoker  . Smokeless tobacco: Never Used  . Tobacco comment: Regular Exercise - Yes  Substance Use Topics  . Alcohol use: No  . Drug use: No     Colonoscopy: FEB 2017  PAP: 2013  Bone density: Remote  Lipid panel:    OBJECTIVE: Middle-aged white woman who appears stated age  Vitals:   10/31/17 0848  BP: (!) 164/74  Pulse: 76  Resp: 18  Temp: 98.5 F (36.9 C)  SpO2: 100%     Body mass index is 29.61 kg/m.    ECOG FS:1 - Symptomatic but completely ambulatory  Sclerae unicteric, EOMs intact Oropharynx clear and moist No cervical or supraclavicular adenopathy Lungs no rales or rhonchi Heart regular rate and rhythm Abd soft, nontender, positive bowel sounds MSK no focal spinal tenderness, no upper extremity lymphedema Neuro: nonfocal, well oriented, appropriate affect Breasts: Status post bilateral mastectomies with bilateral reconstruction, on the left side specifically having a latissimus flap and a saline implant.  There is no evidence of local recurrence.  Both axillae are benign.   LAB RESULTS:  CMP     Component Value  Date/Time   NA 143 11/25/2016 0957   NA 143 08/14/2016 1200   K 3.7 11/25/2016 0957   K 4.2 08/14/2016 1200   CL 103 11/25/2016 0957   CO2 31 11/25/2016 0957   CO2 27 08/14/2016 1200   GLUCOSE 100 (H) 11/25/2016 0957   GLUCOSE  67 (L) 08/14/2016 1200   BUN 15 11/25/2016 0957   BUN 13.3 08/14/2016 1200   CREATININE 0.88 11/25/2016 0957   CREATININE 0.9 08/14/2016 1200   CALCIUM 9.6 11/25/2016 0957   CALCIUM 9.7 08/14/2016 1200   PROT 7.2 11/25/2016 0957   PROT 6.6 08/14/2016 1200   ALBUMIN 4.4 11/25/2016 0957   ALBUMIN 3.8 08/14/2016 1200   AST 30 11/25/2016 0957   AST 24 08/14/2016 1200   ALT 28 11/25/2016 0957   ALT 23 08/14/2016 1200   ALKPHOS 64 11/25/2016 0957   ALKPHOS 71 08/14/2016 1200   BILITOT 0.6 11/25/2016 0957   BILITOT 0.56 08/14/2016 1200   GFRNONAA 54 (L) 06/07/2015 0905   GFRAA >60 06/07/2015 0905    INo results found for: SPEP, UPEP  Lab Results  Component Value Date   WBC 5.5 10/31/2017   NEUTROABS 3.3 10/31/2017   HGB 14.6 10/31/2017   HCT 43.4 10/31/2017   MCV 91.2 10/31/2017   PLT 186 10/31/2017      Chemistry      Component Value Date/Time   NA 143 11/25/2016 0957   NA 143 08/14/2016 1200   K 3.7 11/25/2016 0957   K 4.2 08/14/2016 1200   CL 103 11/25/2016 0957   CO2 31 11/25/2016 0957   CO2 27 08/14/2016 1200   BUN 15 11/25/2016 0957   BUN 13.3 08/14/2016 1200   CREATININE 0.88 11/25/2016 0957   CREATININE 0.9 08/14/2016 1200      Component Value Date/Time   CALCIUM 9.6 11/25/2016 0957   CALCIUM 9.7 08/14/2016 1200   ALKPHOS 64 11/25/2016 0957   ALKPHOS 71 08/14/2016 1200   AST 30 11/25/2016 0957   AST 24 08/14/2016 1200   ALT 28 11/25/2016 0957   ALT 23 08/14/2016 1200   BILITOT 0.6 11/25/2016 0957   BILITOT 0.56 08/14/2016 1200       No results found for: LABCA2  No components found for: QIHKV425  No results for input(s): INR in the last 168 hours.  Urinalysis    Component Value Date/Time   COLORURINE YELLOW 06/27/2016 1048   APPEARANCEUR CLEAR 06/27/2016 1048   LABSPEC 1.015 06/27/2016 1048   PHURINE 7.5 06/27/2016 1048   GLUCOSEU NEGATIVE 06/27/2016 1048   HGBUR NEGATIVE 06/27/2016 1048   BILIRUBINUR NEGATIVE 06/27/2016 1048    BILIRUBINUR neg 02/29/2016 1621   KETONESUR NEGATIVE 06/27/2016 1048   PROTEINUR neg 02/29/2016 1621   PROTEINUR NEGATIVE 08/28/2014 1438   UROBILINOGEN 0.2 06/27/2016 1048   NITRITE NEGATIVE 06/27/2016 1048   LEUKOCYTESUR NEGATIVE 06/27/2016 1048    RADIOLOGY AND OTHER STUDIES: Chest x-ray and hip films today pending  ASSESSMENT: 72 y.o. [place] woman status post left breast upper outer quadrant biopsy 05/09/2014 for a clinical T1-T2, N0 (stage I-II) invasive lobular breast cancer, estrogen receptor 100% positive, progesterone receptor 99% positive, with an MIB-1 of 8%, and no HER-2 amplification.  (1) history of right modified radical mastectomy with TRAM reconstruction 1996, followed by chemotherapy for 6 months; did not receive antiestrogen therapy or radiation therapy adjuvantly  (2) tamoxifen started neoadjuvantly 05/18/2014  (3) status post left mastectomy and sentinel lymph node sampling 07/28/2014 for a pT1c  pN0(i+). Stage IA invasive ductal carcinoma, grade 1, with repeat HER-2 again negative. Margins were ample  (4) Oncotype score of 3 predicts an outside the breast risk of recurrence within 10 years of 4% if the patient's only systemic treatment is tamoxifen for 5 years. It also predicts no significant benefit from chemotherapy.  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 Sequencing and rearrangement analysis for the following 32 genes found no deleterious mutations: APC, ATM, BARD1, BMPR1A, BRCA1, BRCA2, BRIP1, CDH1, CDK4, CDKN2A, CHEK2, EPCAM, GREM1, MLH1, MRE11A, MSH2, MSH6, MUTYH, NBN, NF1, PALB2, PMS2, POLD1, POLE, PTEN, RAD50, RAD51D, SMAD4,  SMARCA4, STK11, and TP53.  (a) Heterozygous MUTYH mutations are not associated with an increased risk for cancer in the patient, however, other family members may be at risk for homozygous mutations and therefore family testing should be considered.   (7) anastrozole discontinued May 2019, tamoxifen resumed  (a) interactions with citalopram likely not clinically relevant  PLAN: [place] is now a little over 3 years out from definitive surgery for her breast cancer with no evidence of disease recurrence.  This is very favorable.  She was not able to tolerate anastrozole, with significant arthralgias and myalgias.  She had done very well with tamoxifen previously.  We reviewed the metabolism of tamoxifen to an Doxaphene and the fact that certain antidepressants including citalopram interfere with that metabolism.  However when the data from the ATAC trial was reviewed, patients who took tamoxifen in that trial and wear on antidepressants like citalopram did equally well as patients taking tamoxifen who were not on those drugs.  In fact the whole idea that in the tamoxifen is the active metabolites of tamoxifen is in question and right now there is no clinical evidence of loss of effectiveness of tamoxifen because of the acknowledged interference with an tamoxifen metabolism by other drugs  Accordingly we are going back to tamoxifen at this point.  The plan is for her to be on antidepressants for a total of 7 years.  She would like her left reconstruction revised and she is being referred to Dr. Iran Planas with that in mind  I have encouraged her to exercise regularly  She will see me again in 1 year.  She knows to call for any problems that may develop before that visit.   Louanne Calvillo, [name] Dad, [name]  10/31/17 9:23 AM Medical Oncology and Hematology [facility] [address] [place], [place] [address] Tel. [phone]    Fax. [phone]  Alice Rieger, am acting as scribe  for Chauncey Cruel [name].  I, Lurline Del [name], have reviewed the above documentation for accuracy and completeness, and I agree with the above.

## 2017-10-31 ENCOUNTER — Telehealth: Payer: Self-pay | Admitting: Oncology

## 2017-10-31 ENCOUNTER — Inpatient Hospital Stay: Payer: Medicare HMO

## 2017-10-31 ENCOUNTER — Inpatient Hospital Stay: Payer: Medicare HMO | Attending: Oncology | Admitting: Oncology

## 2017-10-31 ENCOUNTER — Encounter: Payer: Self-pay | Admitting: Oncology

## 2017-10-31 VITALS — BP 164/74 | HR 76 | Temp 98.5°F | Resp 18 | Ht 67.5 in | Wt 191.9 lb

## 2017-10-31 DIAGNOSIS — M129 Arthropathy, unspecified: Secondary | ICD-10-CM | POA: Insufficient documentation

## 2017-10-31 DIAGNOSIS — R252 Cramp and spasm: Secondary | ICD-10-CM

## 2017-10-31 DIAGNOSIS — F329 Major depressive disorder, single episode, unspecified: Secondary | ICD-10-CM | POA: Insufficient documentation

## 2017-10-31 DIAGNOSIS — C50011 Malignant neoplasm of nipple and areola, right female breast: Secondary | ICD-10-CM

## 2017-10-31 DIAGNOSIS — G8929 Other chronic pain: Secondary | ICD-10-CM | POA: Diagnosis not present

## 2017-10-31 DIAGNOSIS — Z86010 Personal history of colonic polyps: Secondary | ICD-10-CM | POA: Insufficient documentation

## 2017-10-31 DIAGNOSIS — Z79810 Long term (current) use of selective estrogen receptor modulators (SERMs): Secondary | ICD-10-CM

## 2017-10-31 DIAGNOSIS — C50412 Malignant neoplasm of upper-outer quadrant of left female breast: Secondary | ICD-10-CM

## 2017-10-31 DIAGNOSIS — K219 Gastro-esophageal reflux disease without esophagitis: Secondary | ICD-10-CM | POA: Diagnosis not present

## 2017-10-31 DIAGNOSIS — Z800 Family history of malignant neoplasm of digestive organs: Secondary | ICD-10-CM | POA: Diagnosis not present

## 2017-10-31 DIAGNOSIS — R0602 Shortness of breath: Secondary | ICD-10-CM | POA: Insufficient documentation

## 2017-10-31 DIAGNOSIS — M4800 Spinal stenosis, site unspecified: Secondary | ICD-10-CM | POA: Diagnosis not present

## 2017-10-31 DIAGNOSIS — Z170 Estrogen receptor positive status [ER+]: Principal | ICD-10-CM

## 2017-10-31 DIAGNOSIS — Z85820 Personal history of malignant melanoma of skin: Secondary | ICD-10-CM | POA: Insufficient documentation

## 2017-10-31 DIAGNOSIS — Z79899 Other long term (current) drug therapy: Secondary | ICD-10-CM | POA: Insufficient documentation

## 2017-10-31 DIAGNOSIS — E538 Deficiency of other specified B group vitamins: Secondary | ICD-10-CM | POA: Insufficient documentation

## 2017-10-31 DIAGNOSIS — Z803 Family history of malignant neoplasm of breast: Secondary | ICD-10-CM | POA: Diagnosis not present

## 2017-10-31 DIAGNOSIS — C50911 Malignant neoplasm of unspecified site of right female breast: Secondary | ICD-10-CM

## 2017-10-31 DIAGNOSIS — E559 Vitamin D deficiency, unspecified: Secondary | ICD-10-CM | POA: Insufficient documentation

## 2017-10-31 DIAGNOSIS — Z9012 Acquired absence of left breast and nipple: Secondary | ICD-10-CM

## 2017-10-31 DIAGNOSIS — R69 Illness, unspecified: Secondary | ICD-10-CM | POA: Diagnosis not present

## 2017-10-31 DIAGNOSIS — Z9221 Personal history of antineoplastic chemotherapy: Secondary | ICD-10-CM | POA: Diagnosis not present

## 2017-10-31 LAB — CBC WITH DIFFERENTIAL/PLATELET
BASOS ABS: 0 10*3/uL (ref 0.0–0.1)
BASOS PCT: 1 %
Eosinophils Absolute: 0.2 10*3/uL (ref 0.0–0.5)
Eosinophils Relative: 4 %
HEMATOCRIT: 43.4 % (ref 34.8–46.6)
HEMOGLOBIN: 14.6 g/dL (ref 11.6–15.9)
Lymphocytes Relative: 27 %
Lymphs Abs: 1.5 10*3/uL (ref 0.9–3.3)
MCH: 30.7 pg (ref 25.1–34.0)
MCHC: 33.7 g/dL (ref 31.5–36.0)
MCV: 91.2 fL (ref 79.5–101.0)
MONO ABS: 0.5 10*3/uL (ref 0.1–0.9)
Monocytes Relative: 9 %
NEUTROS ABS: 3.3 10*3/uL (ref 1.5–6.5)
NEUTROS PCT: 59 %
Platelets: 186 10*3/uL (ref 145–400)
RBC: 4.76 MIL/uL (ref 3.70–5.45)
RDW: 13.1 % (ref 11.2–14.5)
WBC: 5.5 10*3/uL (ref 3.9–10.3)

## 2017-10-31 MED ORDER — TAMOXIFEN CITRATE 20 MG PO TABS: 20.0000 mg | ORAL_TABLET | Freq: Every day | ORAL | 12 refills | Status: AC

## 2017-10-31 NOTE — Telephone Encounter (Signed)
Gave avs and calendar ° °

## 2017-10-31 NOTE — Addendum Note (Signed)
Addended by: Chauncey Cruel on: 10/31/2017 09:47 AM   Modules accepted: Orders

## 2017-11-05 DIAGNOSIS — M5431 Sciatica, right side: Secondary | ICD-10-CM | POA: Diagnosis not present

## 2017-11-24 ENCOUNTER — Other Ambulatory Visit: Payer: Self-pay | Admitting: Physician Assistant

## 2017-11-24 DIAGNOSIS — F339 Major depressive disorder, recurrent, unspecified: Secondary | ICD-10-CM

## 2017-11-24 NOTE — Telephone Encounter (Signed)
Rx sent in. Note on RX that she needs follow-up before additional refills.

## 2017-11-24 NOTE — Telephone Encounter (Signed)
Last OV 01/13/17, No future OV  Last filled 10/15/17, #30 with 0 refills  Please advise if okay to fill with note that patient needs an appointment.

## 2017-12-05 ENCOUNTER — Encounter: Payer: Self-pay | Admitting: Physician Assistant

## 2017-12-05 ENCOUNTER — Ambulatory Visit (INDEPENDENT_AMBULATORY_CARE_PROVIDER_SITE_OTHER): Payer: Medicare HMO | Admitting: Physician Assistant

## 2017-12-05 DIAGNOSIS — R69 Illness, unspecified: Secondary | ICD-10-CM | POA: Diagnosis not present

## 2017-12-05 DIAGNOSIS — F339 Major depressive disorder, recurrent, unspecified: Secondary | ICD-10-CM

## 2017-12-05 DIAGNOSIS — M5431 Sciatica, right side: Secondary | ICD-10-CM | POA: Diagnosis not present

## 2017-12-05 DIAGNOSIS — M4726 Other spondylosis with radiculopathy, lumbar region: Secondary | ICD-10-CM | POA: Diagnosis not present

## 2017-12-05 HISTORY — DX: Major depressive disorder, recurrent, unspecified: F33.9

## 2017-12-05 MED ORDER — CITALOPRAM HYDROBROMIDE 10 MG PO TABS: 10.0000 mg | ORAL_TABLET | Freq: Every day | ORAL | 1 refills | Status: DC

## 2017-12-05 NOTE — Progress Notes (Signed)
Patient presents to clinic today for follow-up of anxiety and depression. Patient is currently on a regimen of Citalopram 10 mg daily. Has been on this regimen for some time. Has done very well and notes that it is still working well for her. Occasional breakthrough symptoms due to familial stressors (recently lost a loved one), but this is getting much better. Is keeping active with home projects. Has joined gym and working out a few days a week. Is trying to keep a good diet. Notes sleeping very well.    Past Medical History:  Diagnosis Date  . Arthritis    "spine" (07/28/2014)  . Blood dyscrasia    bruises and bleed easily  . Breast cancer, left breast ([place]) 07/28/2014  . Breast cancer, right breast ([place]) 1995  . Chronic back pain    "all over"  . Complication of anesthesia    went to sleep easily but hard to wake up up until elbow OR in 2010  . Depression   . Dyspnea    Normal Spirometry 03/2008 EF 65% BNP normal 11/2007  . Family history of breast cancer   . Family history of colon cancer   . Family history of pancreatic cancer   . Gastric polyps   . GERD (gastroesophageal reflux disease)   . History of chicken pox   . History of hiatal hernia   . Hx of adenomatous polyp of colon 07/03/2015  . Kidney stone    right kidney   . Lichen sclerosus   . Melanoma ([place]) 2010   "right elbow; treated at [facility]"  . Mild anxiety   . Vitamin B12 deficiency   . Vitamin D deficiency     Current Outpatient Medications on File Prior to Visit  Medication Sig Dispense Refill  . cetirizine (ZYRTEC ALLERGY) 10 MG tablet Take 10 mg by mouth as needed.     . Cholecalciferol (VITAMIN D3) 5000 units TABS Take by mouth daily.    . cyanocobalamin 2000 MCG tablet Take 5,000 mcg by mouth daily.     . furosemide (LASIX) 40 MG tablet TAKE 1 TABLET BY MOUTH ONCE DAILY 90 tablet 1  . naproxen sodium (ALEVE) 220 MG tablet Take 220 mg by mouth as needed.     . pantoprazole (PROTONIX) 40 MG tablet Take 1  tablet (40 mg total) by mouth 2 (two) times daily. 60 tablet 3  . Probiotic Product (PROBIOTIC DAILY) CAPS Take 1 capsule by mouth.    . vitamin E 400 UNIT capsule Take 400 Units by mouth daily.     No current facility-administered medications on file prior to visit.     Allergies  Allergen Reactions  . Other Anaphylaxis and Rash  . Silodosin Rash    Facial rash  . Ace Inhibitors Other (See Comments)    REACTION: angioedema  . Buprenorphine Hcl Other (See Comments)    "crazy"  . Morphine And Related Other (See Comments)    "crazy" "crazy"  . Codeine Rash  . Sulfonamide Derivatives Rash  . Telmisartan Rash    Family History  Problem Relation Age of Onset  . Colon cancer Cousin 48       double first cousin  . Colon cancer Cousin 5       double first cousin  . Colon polyps Father        between 53-20  . Dementia Father   . CAD Father        CABG at age 10  . Stroke  Mother   . Pancreatic cancer Brother 59  . Colon polyps Brother        between 10-20  . Cancer Paternal Uncle        NOS  . Anemia Paternal Grandfather        pernicious anemia  . Breast cancer Cousin 23       double first cousin  . Breast cancer Cousin 74       paternal cousin  . Stroke Other        F 1st degree relative 21, M 1st degree relative    Social History   Socioeconomic History  . Marital status: Married    Spouse name: Not on file  . Number of children: 2  . Years of education: Not on file  . Highest education level: Not on file  Occupational History  . Occupation: Freight forwarder  Social Needs  . Financial resource strain: Not on file  . Food insecurity:    Worry: Not on file    Inability: Not on file  . Transportation needs:    Medical: Not on file    Non-medical: Not on file  Tobacco Use  . Smoking status: Never Smoker  . Smokeless tobacco: Never Used  . Tobacco comment: Regular Exercise - Yes  Substance and Sexual Activity  . Alcohol use: No  . Drug use: No  . Sexual  activity: Yes  Lifestyle  . Physical activity:    Days per week: Not on file    Minutes per session: Not on file  . Stress: Not on file  Relationships  . Social connections:    Talks on phone: Not on file    Gets together: Not on file    Attends religious service: Not on file    Active member of club or organization: Not on file    Attends meetings of clubs or organizations: Not on file    Relationship status: Not on file  Other Topics Concern  . Not on file  Social History Narrative   She lives with husband.     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 [place] [employer].  All other ROS are negative.  BP 122/82   Pulse 68   Temp 98.2 F (36.8 C) (Oral)   Resp 14   Ht 5\' 8"  (1.727 m)   Wt 192 lb (87.1 kg)   SpO2 98%   BMI 29.19 kg/m   Physical Exam  Constitutional: She is oriented to person, place, and time. She appears well-developed and well-nourished.  HENT:  Head: Normocephalic and atraumatic.  Cardiovascular: Normal rate, regular rhythm, normal heart sounds and intact distal pulses.  Pulmonary/Chest: Effort normal.  Neurological: She is alert and oriented to person, place, and time.  Psychiatric: She has a normal mood and affect.  Vitals reviewed.  Recent Results (from the past 2160 hour(s))  CBC with Differential/Platelet     Status: None   Collection Time: 10/31/17  8:38 AM  Result Value Ref Range   WBC 5.5 3.9 - 10.3 K/uL   RBC 4.76 3.70 - 5.45 MIL/uL   Hemoglobin 14.6 11.6 - 15.9 g/dL   HCT 43.4 34.8 - 46.6 %   MCV 91.2 79.5 - 101.0 fL   MCH 30.7 25.1 - 34.0 pg   MCHC 33.7 31.5 - 36.0 g/dL   RDW 13.1 11.2 - 14.5 %   Platelets 186 145 - 400 K/uL  Neutrophils Relative % 59 %   Neutro Abs 3.3 1.5 - 6.5 K/uL   Lymphocytes Relative 27 %   Lymphs Abs 1.5 0.9 - 3.3 K/uL   Monocytes Relative 9 %   Monocytes Absolute 0.5 0.1 - 0.9 K/uL   Eosinophils Relative 4 %   Eosinophils Absolute 0.2  0.0 - 0.5 K/uL   Basophils Relative 1 %   Basophils Absolute 0.0 0.0 - 0.1 K/uL    Comment: Performed at [facility], [place] [address], [place], [place] [address]    Assessment/Plan: 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 Doing very well overall. Continue diet and exercise. Continue Citalopram 10 mg daily. Patient due for MWV/CPE which we will have scheduled before she leaves the office today.    Leeanne Rio, PA-C

## 2017-12-05 NOTE — Assessment & Plan Note (Signed)
In remission. Doing very well overall. Continue diet and exercise. Continue Citalopram 10 mg daily. Patient due for MWV/CPE which we will have scheduled before she leaves the office today.

## 2017-12-05 NOTE — Patient Instructions (Signed)
Please continue current medication regimen. I am glad you are doing well.  Let's get you scheduled for your Medicare Wellness Visit with [name] and your regular Phyiscal with me afterwards. I would recommend we try to do this sometime before the end of summer.

## 2017-12-25 DIAGNOSIS — M4150 Other secondary scoliosis, site unspecified: Secondary | ICD-10-CM | POA: Diagnosis not present

## 2017-12-25 DIAGNOSIS — M5431 Sciatica, right side: Secondary | ICD-10-CM | POA: Diagnosis not present

## 2017-12-25 DIAGNOSIS — M4726 Other spondylosis with radiculopathy, lumbar region: Secondary | ICD-10-CM | POA: Diagnosis not present

## 2017-12-25 DIAGNOSIS — Z6830 Body mass index (BMI) 30.0-30.9, adult: Secondary | ICD-10-CM | POA: Diagnosis not present

## 2017-12-29 ENCOUNTER — Ambulatory Visit: Payer: Medicare HMO | Admitting: Physician Assistant

## 2018-01-12 ENCOUNTER — Ambulatory Visit: Payer: Medicare HMO | Admitting: Rheumatology

## 2018-01-25 HISTORY — PX: LUMBAR FUSION: SHX111

## 2018-01-27 ENCOUNTER — Other Ambulatory Visit: Payer: Self-pay | Admitting: *Deleted

## 2018-01-27 ENCOUNTER — Telehealth: Payer: Self-pay | Admitting: *Deleted

## 2018-01-27 NOTE — Telephone Encounter (Signed)
Pt left VM inquiring about referral to Dr Iran Planas for possible further reconstruction concerns ( new  Referral ).  This RN noted referral was placed per 6/7 MD visit.  Per contact with Dr Para Skeans was informed referral does not cross over to [facility] que per Epic - referral will need to be faxed. Fax number verified.  Records faxed and verified as received.  This RN returned call to pt and informed her of above and apology given per delay in referral. Pt stated appreciation.

## 2018-01-28 NOTE — Progress Notes (Signed)
Subjective:   [name] is a 72 y.o. female who presents for Medicare Annual (Subsequent) preventive examination. Accompanied by daughter, [name].   Review of Systems:  No ROS.  Medicare Wellness Visit. Additional risk factors are reflected in the social history.  Cardiac Risk Factors include: advanced age (>5men, >58 women);family history of premature cardiovascular disease   Sleep patterns: Sleeps 9-10 hours, feels rested.  Home Safety/Smoke Alarms: Feels safe in home. Smoke alarms in place.  Living environment; residence and Firearm Safety: Lives with husband in 1 story home.  Seat Belt Safety/Bike Helmet: Wears seat belt.   Female:   Pap-N/A      Mammo-05/09/2014.H/O Bst Ca bilaterally      Dexa scan-05/03/2013, Osteopenia. Ordered today, GI breast center.        CCS-Colonoscopy 06/28/2015, polyp. Recall 5 years.      Objective:     Vitals: BP (!) 142/80 (BP Location: Left Arm, Patient Position: Sitting, Cuff Size: Normal)   Pulse 70   Ht 5\' 8"  (1.727 m)   Wt 192 lb (87.1 kg)   SpO2 99%   BMI 29.19 kg/m   Body mass index is 29.19 kg/m.  Advanced Directives 01/29/2018 06/27/2016 04/04/2016 02/22/2016 06/14/2015 06/09/2015 06/07/2015  Does Patient Have a Medical Advance Directive? Yes Yes Yes Yes Yes - Yes  Type of Advance Directive [place];Living will [place];Living will [place];Living will - [place];Living will - Living will;Healthcare Power of Attorney  Does patient want to make changes to medical advance directive? - - - - No - Patient declined No - Patient declined No - Patient declined  Copy of [place] in Chart? No - copy requested No - copy requested - - No - copy requested - No - copy requested    Tobacco Social History   Tobacco Use  Smoking Status Never Smoker  Smokeless Tobacco Never Used  Tobacco Comment   Regular Exercise - Yes     Counseling given:  Not Answered Comment: Regular Exercise - Yes    Past Medical History:  Diagnosis Date  . Arthritis    "spine" (07/28/2014)  . Blood dyscrasia    bruises and bleed easily  . Breast cancer, left breast ([place]) 07/28/2014  . Breast cancer, right breast ([place]) 1995  . Chronic back pain    "all over"  . Complication of anesthesia    went to sleep easily but hard to wake up up until elbow OR in 2010  . Depression   . Dyspnea    Normal Spirometry 03/2008 EF 65% BNP normal 11/2007  . Family history of breast cancer   . Family history of colon cancer   . Family history of pancreatic cancer   . Gastric polyps   . GERD (gastroesophageal reflux disease)   . History of chicken pox   . History of hiatal hernia   . Hx of adenomatous polyp of colon 07/03/2015  . Kidney stone    right kidney   . Lichen sclerosus   . Melanoma ([place]) 2010   "right elbow; treated at [facility]"  . Mild anxiety   . Vitamin B12 deficiency   . Vitamin D deficiency    Past Surgical History:  Procedure Laterality Date  . BREAST BIOPSY Left 04/2014  . BREAST RECONSTRUCTION WITH PLACEMENT OF TISSUE EXPANDER AND FLEX HD (ACELLULAR HYDRATED DERMIS) Left 07/28/2014   Procedure: LEFT BREAST RECONSTRUCTION PLACEMENT OF LEFT TISSUE EXPANDER ;  Surgeon: Crissie Reese, MD;  Location: [facility];  Service: Plastics;  Laterality: Left;  . BREAST RECONSTRUCTION WITH PLACEMENT OF TISSUE EXPANDER AND FLEX HD (ACELLULAR HYDRATED DERMIS) Left 09/08/2014   Procedure: REMOVAL OF TISSUE EXPANDER FROM LEFT BREAST;  Surgeon: Crissie Reese, MD;  Location: [facility];  Service: Plastics;  Laterality: Left;  . BUNIONECTOMY Bilateral 1970's  . COLONOSCOPY     Dr Sharlett Iles  . CYSTOSCOPY WITH RETROGRADE PYELOGRAM, URETEROSCOPY AND STENT PLACEMENT Right 06/09/2015   Procedure: CYSTOSCOPY WITH RIGHT RETROGRADE PYELOGRAM, RIGHT URETEROSCOPY AND RIGHT URTERAL STENT PLACEMENT;  Surgeon: Ardis Hughs, MD;  Location: [facility];  Service: Urology;  Laterality: Right;  .  ESOPHAGOGASTRODUODENOSCOPY    . HERNIA REPAIR    . HOLMIUM LASER APPLICATION Right 01/18/538   Procedure: HOLMIUM LASER APPLICATION;  Surgeon: Ardis Hughs, MD;  Location: [facility];  Service: Urology;  Laterality: Right;  . LATISSIMUS FLAP TO BREAST Left 09/08/2014   Procedure: LEFT LATISSIMUS FLAP TO BREAST WITH SALINE IMPLANT FOR BREAST RECONSTRUCTION;  Surgeon: Crissie Reese, MD;  Location: [facility];  Service: Plastics;  Laterality: Left;  Marland Kitchen MASTECTOMY Right 1996    chemotherapy. pt. states 13 lymph nodes were removed  . MASTECTOMY COMPLETE / SIMPLE W/ SENTINEL NODE BIOPSY Left 07/28/2014  . MASTECTOMY W/ SENTINEL NODE BIOPSY Left 07/28/2014   Procedure: LEFT MASTECTOMY WITH SENTINEL LYMPH NODE MAPPING;  Surgeon: Autumn Messing III, MD;  Location: [facility];  Service: General;  Laterality: Left;  Marland Kitchen MELANOMA EXCISION Right 2010   From elbow-- [facility] at [facility]   . NISSEN FUNDOPLICATION  [id number]  . RECONSTRUCTION BREAST IMMEDIATE / DELAYED W/ TISSUE EXPANDER Left 07/28/2014  . TEMPOROMANDIBULAR JOINT SURGERY Bilateral 1987  . TONSILLECTOMY     Family History  Problem Relation Age of Onset  . Colon cancer Cousin 72       double first cousin  . Colon cancer Cousin 82       double first cousin  . Colon polyps Father        between 46-20  . Dementia Father   . CAD Father        CABG at age 33  . Stroke Mother   . Pancreatic cancer Brother 46  . Colon polyps Brother        between 10-20  . Cancer Paternal Uncle        NOS  . Anemia Paternal Grandfather        pernicious anemia  . Breast cancer Cousin 66       double first cousin  . Breast cancer Cousin 74       paternal cousin  . Stroke Other        F 1st degree relative 66, M 1st degree relative   Social History   Socioeconomic History  . Marital status: Married    Spouse name: Not on file  . Number of children: 2  . Years of education: Not on file  . Highest education level: Not on file  Occupational History  . Occupation: Freight forwarder    Social Needs  . Financial resource strain: Not on file  . Food insecurity:    Worry: Not on file    Inability: Not on file  . Transportation needs:    Medical: Not on file    Non-medical: Not on file  Tobacco Use  . Smoking status: Never Smoker  . Smokeless tobacco: Never Used  . Tobacco comment: Regular Exercise - Yes  Substance and Sexual Activity  .  Alcohol use: No  . Drug use: No  . Sexual activity: Yes  Lifestyle  . Physical activity:    Days per week: Not on file    Minutes per session: Not on file  . Stress: Not on file  Relationships  . Social connections:    Talks on phone: Not on file    Gets together: Not on file    Attends religious service: Not on file    Active member of club or organization: Not on file    Attends meetings of clubs or organizations: Not on file    Relationship status: Not on file  Other Topics Concern  . Not on file  Social History Narrative   She lives with husband.     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 cetirizine (ZYRTEC ALLERGY) 10 MG tablet Take 10 mg by mouth as needed.   . Cholecalciferol (VITAMIN D3) 5000 units TABS Take by mouth daily.  . citalopram (CELEXA) 10 MG tablet Take 1 tablet (10 mg total) by mouth daily. Appointment required for further refills.  . cyanocobalamin 2000 MCG tablet Take 5,000 mcg by mouth daily.   . furosemide (LASIX) 40 MG tablet TAKE 1 TABLET BY MOUTH ONCE DAILY  . naproxen sodium (ALEVE) 220 MG tablet Take 220 mg by mouth as needed.   . pantoprazole (PROTONIX) 40 MG tablet Take 1 tablet (40 mg total) by mouth 2 (two) times daily.  . pregabalin (LYRICA) 50 MG capsule Take by mouth.  . Probiotic Product (PROBIOTIC DAILY) CAPS Take 1 capsule by mouth.  . tamoxifen (NOLVADEX) 20 MG tablet TAKE 1 TABLET BY MOUTH ONCE DAILY  . vitamin E 400 UNIT capsule Take 400 Units by mouth  daily.  Marland Kitchen Zoster Vaccine Adjuvanted [facility]) injection Inject 0.5 mLs into the muscle once for 1 dose.   No facility-administered encounter medications on file as of 01/29/2018.     Activities of Daily Living In your present state of health, do you have any difficulty performing the following activities: 01/29/2018 12/05/2017  Hearing? N N  Vision? N N  Difficulty concentrating or making decisions? N N  Walking or climbing stairs? N N  Dressing or bathing? N N  Doing errands, shopping? N N  Preparing Food and eating ? N -  Using the Toilet? N -  In the past six months, have you accidently leaked urine? N -  Do you have problems with loss of bowel control? N -  Managing your Medications? N -  Managing your Finances? N -  Housekeeping or managing your Housekeeping? N -  Some recent data might be hidden    Patient Care Team: Delorse Limber as PCP - General (Physician Assistant) Josue Hector, MD (Cardiology) Cheri Fowler, MD as Attending Physician (Obstetrics and Gynecology) Magrinat, Virgie Dad, MD (Hematology and Oncology) Gatha Mayer, MD as Consulting Physician (Gastroenterology) Iona Beard, [place] as Referring Physician (Optometry) Jovita Kussmaul, MD as Consulting Physician (General Surgery) Ardis Hughs, MD as Attending Physician (Urology) Charolotte Capuchin, MD as Consulting Physician (Dentistry) Ivan Croft (Spine Surgery)    Assessment:   This is a routine wellness examination for [name].  Exercise Activities and Dietary recommendations Current Exercise Habits: The patient does not participate in regular exercise at present(Maintains housework, gardening. Stays active), Exercise limited by: orthopedic condition(s)(back pain)   Diet (  meal preparation, eat out, water intake, caffeinated beverages, dairy products, fruits and vegetables):  Drinks water and green tea.   Breakfast: skips; cereal; [employer] bars Lunch: skips; sandwich Dinner: protein and  vegetables.   Goals      Patient Stated   . Travel more and visit with family.   (pt-stated)      Other   . Increase physical activity     Has silver sneakers. Plans to use this year.   Walking and water aerobics         Fall Risk Fall Risk  01/29/2018 02/17/2017 06/27/2016 06/13/2015 04/06/2015  Falls in the past year? No Yes No No Yes  Number falls in past yr: - 2 or more - - 1  Injury with Fall? - No - - No  Risk Factor Category  - High Fall Risk - - -  Follow up - Falls evaluation completed - - Education provided;Falls prevention discussed    Depression Screen PHQ 2/9 Scores 01/29/2018 12/05/2017 01/13/2017 11/05/2016  PHQ - 2 Score 0 0 0 0  PHQ- 9 Score 1 2 3 6      Cognitive Function MMSE - Mini Mental State Exam 01/29/2018 04/06/2015  Orientation to time 5 5  Orientation to Place 5 5  Registration 3 3  Attention/ Calculation 5 5  Recall 2 3  Language- name 2 objects 2 2  Language- repeat 1 1  Language- follow 3 step command 3 3  Language- read & follow direction 1 1  Write a sentence 1 1  Copy design 1 1  Total score 29 30        Immunization History  Administered Date(s) Administered  . Influenza Split 02/06/2011, 02/10/2012  . Influenza Whole 04/14/2007  . Influenza, High Dose Seasonal PF 04/06/2015, 03/18/2017, 01/29/2018  . Influenza,inj,Quad PF,6+ Mos 07/29/2014, 02/20/2016  . Pneumococcal Conjugate-13 02/20/2016  . Pneumococcal Polysaccharide-23 07/29/2014  . Tdap 01/03/2013  . Tetanus 01/04/2008    Screening Tests Health Maintenance  Topic Date Due  . INFLUENZA VACCINE  12/25/2017  . COLONOSCOPY  06/27/2020  . DTaP/Tdap/Td (2 - Td) 01/04/2023  . TETANUS/TDAP  01/04/2023  . DEXA SCAN  Completed  . Hepatitis C Screening  Completed  . PNA vac Low Risk Adult  Completed        Plan:     Schedule bone density scan.   Shingles vaccine at pharmacy.   Bring a copy of your living will and/or healthcare power of attorney to your next office  visit.  Continue doing brain stimulating activities (puzzles, reading, adult coloring books, staying active) to keep memory sharp.   I have personally reviewed and noted the following in the patient's chart:   . Medical and social history . Use of alcohol, tobacco or illicit drugs  . Current medications and supplements . Functional ability and status . Nutritional status . Physical activity . Advanced directives . List of other physicians . Hospitalizations, surgeries, and ER visits in previous 12 months . Vitals . Screenings to include cognitive, depression, and falls . Referrals and appointments  In addition, I have reviewed and discussed with patient certain preventive protocols, quality metrics, and best practice recommendations. A written personalized care plan for preventive services as well as general preventive health recommendations were provided to patient.     Gerilyn Nestle, RN  01/29/2018   PCP Notes: -PHQ9=1 -Pt scheduled for back surgery 02/23/18 -F/U with PCP (CPE), 04/07/18

## 2018-01-29 ENCOUNTER — Other Ambulatory Visit: Payer: Self-pay

## 2018-01-29 ENCOUNTER — Ambulatory Visit (INDEPENDENT_AMBULATORY_CARE_PROVIDER_SITE_OTHER): Payer: Medicare HMO

## 2018-01-29 VITALS — BP 142/80 | HR 70 | Ht 68.0 in | Wt 192.0 lb

## 2018-01-29 DIAGNOSIS — Z0000 Encounter for general adult medical examination without abnormal findings: Secondary | ICD-10-CM | POA: Diagnosis not present

## 2018-01-29 DIAGNOSIS — Z9882 Breast implant status: Secondary | ICD-10-CM | POA: Diagnosis not present

## 2018-01-29 DIAGNOSIS — Z9013 Acquired absence of bilateral breasts and nipples: Secondary | ICD-10-CM | POA: Diagnosis not present

## 2018-01-29 DIAGNOSIS — Z23 Encounter for immunization: Secondary | ICD-10-CM

## 2018-01-29 DIAGNOSIS — Z853 Personal history of malignant neoplasm of breast: Secondary | ICD-10-CM | POA: Diagnosis not present

## 2018-01-29 DIAGNOSIS — E2839 Other primary ovarian failure: Secondary | ICD-10-CM

## 2018-01-29 DIAGNOSIS — Z1589 Genetic susceptibility to other disease: Secondary | ICD-10-CM | POA: Diagnosis not present

## 2018-01-29 MED ORDER — ZOSTER VAC RECOMB ADJUVANTED 50 MCG/0.5ML IM SUSR: 0.5000 mL | Freq: Once | INTRAMUSCULAR | 1 refills | Status: AC

## 2018-01-29 NOTE — Progress Notes (Signed)
RN MWV note reviewed.  [name] Gros Cody Kimberla Driskill, PA-C  

## 2018-01-29 NOTE — Patient Instructions (Addendum)
Schedule bone density scan.   Shingles vaccine at pharmacy.   Bring a copy of your living will and/or healthcare power of attorney to your next office visit.  Continue doing brain stimulating activities (puzzles, reading, adult coloring books, staying active) to keep memory sharp.   Health Maintenance, Female Adopting a healthy lifestyle and getting preventive care can go a long way to promote health and wellness. Talk with your health care provider about what schedule of regular examinations is right for you. This is a good chance for you to check in with your provider about disease prevention and staying healthy. In between checkups, there are plenty of things you can do on your own. Experts have done a lot of research about which lifestyle changes and preventive measures are most likely to keep you healthy. Ask your health care provider for more information. Weight and diet Eat a healthy diet  Be sure to include plenty of vegetables, fruits, low-fat dairy products, and lean protein.  Do not eat a lot of foods high in solid fats, added sugars, or salt.  Get regular exercise. This is one of the most important things you can do for your health. ? Most adults should exercise for at least 150 minutes each week. The exercise should increase your heart rate and make you sweat (moderate-intensity exercise). ? Most adults should also do strengthening exercises at least twice a week. This is in addition to the moderate-intensity exercise.  Maintain a healthy weight  Body mass index (BMI) is a measurement that can be used to identify possible weight problems. It estimates body fat based on height and weight. Your health care provider can help determine your BMI and help you achieve or maintain a healthy weight.  For females 54 years of age and older: ? A BMI below 18.5 is considered underweight. ? A BMI of 18.5 to 24.9 is normal. ? A BMI of 25 to 29.9 is considered overweight. ? A BMI of 30 and  above is considered obese.  Watch levels of cholesterol and blood lipids  You should start having your blood tested for lipids and cholesterol at 72 years of age, then have this test every 5 years.  You may need to have your cholesterol levels checked more often if: ? Your lipid or cholesterol levels are high. ? You are older than 72 years of age. ? You are at high risk for heart disease.  Cancer screening Lung Cancer  Lung cancer screening is recommended for adults 11-26 years old who are at high risk for lung cancer because of a history of smoking.  A yearly low-dose CT scan of the lungs is recommended for people who: ? Currently smoke. ? Have quit within the past 15 years. ? Have at least a 30-pack-year history of smoking. A pack year is smoking an average of one pack of cigarettes a day for 1 year.  Yearly screening should continue until it has been 15 years since you quit.  Yearly screening should stop if you develop a health problem that would prevent you from having lung cancer treatment.  Breast Cancer  Practice breast self-awareness. This means understanding how your breasts normally appear and feel.  It also means doing regular breast self-exams. Let your health care provider know about any changes, no matter how small.  If you are in your 20s or 30s, you should have a clinical breast exam (CBE) by a health care provider every 1-3 years as part of a regular health  exam.  If you are 40 or older, have a CBE every year. Also consider having a breast X-ray (mammogram) every year.  If you have a family history of breast cancer, talk to your health care provider about genetic screening.  If you are at high risk for breast cancer, talk to your health care provider about having an MRI and a mammogram every year.  Breast cancer gene (BRCA) assessment is recommended for women who have family members with BRCA-related cancers. BRCA-related cancers  include: ? Breast. ? Ovarian. ? Tubal. ? Peritoneal cancers.  Results of the assessment will determine the need for genetic counseling and BRCA1 and BRCA2 testing.  Cervical Cancer Your health care provider may recommend that you be screened regularly for cancer of the pelvic organs (ovaries, uterus, and vagina). This screening involves a pelvic examination, including checking for microscopic changes to the surface of your cervix (Pap test). You may be encouraged to have this screening done every 3 years, beginning at age 55.  For women ages 59-65, health care providers may recommend pelvic exams and Pap testing every 3 years, or they may recommend the Pap and pelvic exam, combined with testing for human papilloma virus (HPV), every 5 years. Some types of HPV increase your risk of cervical cancer. Testing for HPV may also be done on women of any age with unclear Pap test results.  Other health care providers may not recommend any screening for nonpregnant women who are considered low risk for pelvic cancer and who do not have symptoms. Ask your health care provider if a screening pelvic exam is right for you.  If you have had past treatment for cervical cancer or a condition that could lead to cancer, you need Pap tests and screening for cancer for at least 20 years after your treatment. If Pap tests have been discontinued, your risk factors (such as having a new sexual partner) need to be reassessed to determine if screening should resume. Some women have medical problems that increase the chance of getting cervical cancer. In these cases, your health care provider may recommend more frequent screening and Pap tests.  Colorectal Cancer  This type of cancer can be detected and often prevented.  Routine colorectal cancer screening usually begins at 72 years of age and continues through 72 years of age.  Your health care provider may recommend screening at an earlier age if you have risk factors  for colon cancer.  Your health care provider may also recommend using home test kits to check for hidden blood in the stool.  A small camera at the end of a tube can be used to examine your colon directly (sigmoidoscopy or colonoscopy). This is done to check for the earliest forms of colorectal cancer.  Routine screening usually begins at age 75.  Direct examination of the colon should be repeated every 5-10 years through 72 years of age. However, you may need to be screened more often if early forms of precancerous polyps or small growths are found.  Skin Cancer  Check your skin from head to toe regularly.  Tell your health care provider about any new moles or changes in moles, especially if there is a change in a mole's shape or color.  Also tell your health care provider if you have a mole that is larger than the size of a pencil eraser.  Always use sunscreen. Apply sunscreen liberally and repeatedly throughout the day.  Protect yourself by wearing long sleeves, pants, a wide-brimmed  hat, and sunglasses whenever you are outside.  Heart disease, diabetes, and high blood pressure  High blood pressure causes heart disease and increases the risk of stroke. High blood pressure is more likely to develop in: ? People who have blood pressure in the high end of the normal range (130-139/85-89 mm Hg). ? People who are overweight or obese. ? People who are African American.  If you are 77-97 years of age, have your blood pressure checked every 3-5 years. If you are 30 years of age or older, have your blood pressure checked every year. You should have your blood pressure measured twice-once when you are at a hospital or clinic, and once when you are not at a hospital or clinic. Record the average of the two measurements. To check your blood pressure when you are not at a hospital or clinic, you can use: ? An automated blood pressure machine at a pharmacy. ? A home blood pressure monitor.  If  you are between 7 years and 48 years old, ask your health care provider if you should take aspirin to prevent strokes.  Have regular diabetes screenings. This involves taking a blood sample to check your fasting blood sugar level. ? If you are at a normal weight and have a low risk for diabetes, have this test once every three years after 72 years of age. ? If you are overweight and have a high risk for diabetes, consider being tested at a younger age or more often. Preventing infection Hepatitis B  If you have a higher risk for hepatitis B, you should be screened for this virus. You are considered at high risk for hepatitis B if: ? You were born in a country where hepatitis B is common. Ask your health care provider which countries are considered high risk. ? Your parents were born in a high-risk country, and you have not been immunized against hepatitis B (hepatitis B vaccine). ? You have HIV or AIDS. ? You use needles to inject street drugs. ? You live with someone who has hepatitis B. ? You have had sex with someone who has hepatitis B. ? You get hemodialysis treatment. ? You take certain medicines for conditions, including cancer, organ transplantation, and autoimmune conditions.  Hepatitis C  Blood testing is recommended for: ? Everyone born from [birth year] through 1965. ? Anyone with known risk factors for hepatitis C.  Sexually transmitted infections (STIs)  You should be screened for sexually transmitted infections (STIs) including gonorrhea and chlamydia if: ? You are sexually active and are younger than 72 years of age. ? You are older than 72 years of age and your health care provider tells you that you are at risk for this type of infection. ? Your sexual activity has changed since you were last screened and you are at an increased risk for chlamydia or gonorrhea. Ask your health care provider if you are at risk.  If you do not have HIV, but are at risk, it may be recommended  that you take a prescription medicine daily to prevent HIV infection. This is called pre-exposure prophylaxis (PrEP). You are considered at risk if: ? You are sexually active and do not regularly use condoms or know the HIV status of your partner(s). ? You take drugs by injection. ? You are sexually active with a partner who has HIV.  Talk with your health care provider about whether you are at high risk of being infected with HIV. If you choose to begin  PrEP, you should first be tested for HIV. You should then be tested every 3 months for as long as you are taking PrEP. Pregnancy  If you are premenopausal and you may become pregnant, ask your health care provider about preconception counseling.  If you may become pregnant, take 400 to 800 micrograms (mcg) of folic acid every day.  If you want to prevent pregnancy, talk to your health care provider about birth control (contraception). Osteoporosis and menopause  Osteoporosis is a disease in which the bones lose minerals and strength with aging. This can result in serious bone fractures. Your risk for osteoporosis can be identified using a bone density scan.  If you are 22 years of age or older, or if you are at risk for osteoporosis and fractures, ask your health care provider if you should be screened.  Ask your health care provider whether you should take a calcium or vitamin D supplement to lower your risk for osteoporosis.  Menopause may have certain physical symptoms and risks.  Hormone replacement therapy may reduce some of these symptoms and risks. Talk to your health care provider about whether hormone replacement therapy is right for you. Follow these instructions at home:  Schedule regular health, dental, and eye exams.  Stay current with your immunizations.  Do not use any tobacco products including cigarettes, chewing tobacco, or electronic cigarettes.  If you are pregnant, do not drink alcohol.  If you are  breastfeeding, limit how much and how often you drink alcohol.  Limit alcohol intake to no more than 1 drink per day for nonpregnant women. One drink equals 12 ounces of beer, 5 ounces of wine, or 1 ounces of hard liquor.  Do not use street drugs.  Do not share needles.  Ask your health care provider for help if you need support or information about quitting drugs.  Tell your health care provider if you often feel depressed.  Tell your health care provider if you have ever been abused or do not feel safe at home. This information is not intended to replace advice given to you by your health care provider. Make sure you discuss any questions you have with your health care provider. Document Released: 11/26/2010 Document Revised: 10/19/2015 Document Reviewed: 02/14/2015 Elsevier Interactive Patient Education  [employer].

## 2018-02-02 ENCOUNTER — Telehealth: Payer: Self-pay | Admitting: Emergency Medicine

## 2018-02-02 NOTE — Telephone Encounter (Signed)
Received paperwork from [facility]. Patient has pending appt for surgery on 02/23/18. She is having back surgery on her L5 Last ECG was 11/2016 Last labs were 10/31/17 by Dr Jana Hakim  Patient is scheduled for 02/06/2018 at [place]

## 2018-02-06 ENCOUNTER — Ambulatory Visit (INDEPENDENT_AMBULATORY_CARE_PROVIDER_SITE_OTHER): Payer: Medicare HMO | Admitting: Physician Assistant

## 2018-02-06 ENCOUNTER — Other Ambulatory Visit: Payer: Self-pay

## 2018-02-06 ENCOUNTER — Encounter: Payer: Self-pay | Admitting: Physician Assistant

## 2018-02-06 VITALS — BP 134/86 | HR 75 | Temp 97.9°F | Resp 16 | Ht 66.75 in | Wt 188.4 lb

## 2018-02-06 DIAGNOSIS — Z01818 Encounter for other preprocedural examination: Secondary | ICD-10-CM | POA: Diagnosis not present

## 2018-02-06 DIAGNOSIS — N3090 Cystitis, unspecified without hematuria: Secondary | ICD-10-CM | POA: Diagnosis not present

## 2018-02-06 LAB — CBC WITH DIFFERENTIAL/PLATELET
BASOS ABS: 0 10*3/uL (ref 0.0–0.1)
Basophils Relative: 0.8 % (ref 0.0–3.0)
Eosinophils Absolute: 0.2 10*3/uL (ref 0.0–0.7)
Eosinophils Relative: 3.9 % (ref 0.0–5.0)
HCT: 41 % (ref 36.0–46.0)
Hemoglobin: 13.8 g/dL (ref 12.0–15.0)
LYMPHS ABS: 1.3 10*3/uL (ref 0.7–4.0)
Lymphocytes Relative: 25.4 % (ref 12.0–46.0)
MCHC: 33.7 g/dL (ref 30.0–36.0)
MCV: 91.4 fl (ref 78.0–100.0)
MONO ABS: 0.4 10*3/uL (ref 0.1–1.0)
MONOS PCT: 8 % (ref 3.0–12.0)
NEUTROS ABS: 3.2 10*3/uL (ref 1.4–7.7)
NEUTROS PCT: 61.9 % (ref 43.0–77.0)
Platelets: 149 10*3/uL — ABNORMAL LOW (ref 150.0–400.0)
RBC: 4.49 Mil/uL (ref 3.87–5.11)
RDW: 13.6 % (ref 11.5–15.5)
WBC: 5.2 10*3/uL (ref 4.0–10.5)

## 2018-02-06 LAB — COMPREHENSIVE METABOLIC PANEL
ALT: 12 U/L (ref 0–35)
AST: 15 U/L (ref 0–37)
Albumin: 4 g/dL (ref 3.5–5.2)
Alkaline Phosphatase: 51 U/L (ref 39–117)
BUN: 15 mg/dL (ref 6–23)
CO2: 30 mEq/L (ref 19–32)
Calcium: 8.6 mg/dL (ref 8.4–10.5)
Chloride: 103 mEq/L (ref 96–112)
Creatinine, Ser: 0.86 mg/dL (ref 0.40–1.20)
GFR: 68.93 mL/min (ref >60.00)
GLUCOSE: 89 mg/dL (ref 70–99)
POTASSIUM: 4 meq/L (ref 3.5–5.1)
SODIUM: 142 meq/L (ref 135–145)
TOTAL PROTEIN: 6.2 g/dL (ref 6.0–8.3)
Total Bilirubin: 0.6 mg/dL (ref 0.2–1.2)

## 2018-02-06 LAB — PROTIME-INR
INR: 1 ratio (ref 0.8–1.0)
Prothrombin Time: 12.1 s (ref 9.6–13.1)

## 2018-02-06 MED ORDER — CITALOPRAM HYDROBROMIDE 20 MG PO TABS: 20.0000 mg | ORAL_TABLET | Freq: Every day | ORAL | 1 refills | Status: DC

## 2018-02-06 NOTE — Patient Instructions (Signed)
Please go to the lab today for blood work.  I will call you with your results. We will alter treatment regimen(s) if indicated by your results.   If everything looks good we will clear you for surgery. Take care!

## 2018-02-06 NOTE — Progress Notes (Signed)
Patient presents to clinic today for preoperative clearance. Patient is scheduled for L5-S1 laminectomy and fusion, possible allograft and bone marrow aspiration with Dr. Sherlyn Lick on 02/23/18. Denies chest pain, SOB, fever, chills or malaise. Used to have issue with anesthesia but no problems in any surgery since 2010. Denies dysuria,urinary urgency or frequency. Pain level currently at a 7/10 and averages 7-8/10. Takes Aleve on an as needed basis. Denies any new or worsening symptoms.    Past Medical History:  Diagnosis Date  . Arthritis    "spine" (07/28/2014)  . Blood dyscrasia    bruises and bleed easily  . Breast cancer, left breast ([place]) 07/28/2014  . Breast cancer, right breast ([place]) 1995  . Chronic back pain    "all over"  . Complication of anesthesia    went to sleep easily but hard to wake up up until elbow OR in 2010  . Depression   . Dyspnea    Normal Spirometry 03/2008 EF 65% BNP normal 11/2007  . Family history of breast cancer   . Family history of colon cancer   . Family history of pancreatic cancer   . Gastric polyps   . GERD (gastroesophageal reflux disease)   . History of chicken pox   . History of hiatal hernia   . Hx of adenomatous polyp of colon 07/03/2015  . Kidney stone    right kidney   . Lichen sclerosus   . Melanoma ([place]) 2010   "right elbow; treated at [facility]"  . Mild anxiety   . Vitamin B12 deficiency   . Vitamin D deficiency     Current Outpatient Medications on File Prior to Visit  Medication Sig Dispense Refill  . cetirizine (ZYRTEC ALLERGY) 10 MG tablet Take 10 mg by mouth as needed.     . Cholecalciferol (VITAMIN D3) 5000 units TABS Take by mouth daily.    . cyanocobalamin 2000 MCG tablet Take 5,000 mcg by mouth daily.     . furosemide (LASIX) 40 MG tablet TAKE 1 TABLET BY MOUTH ONCE DAILY 90 tablet 1  . naproxen sodium (ALEVE) 220 MG tablet Take 220 mg by mouth as needed.     . pantoprazole (PROTONIX) 40 MG tablet Take 1 tablet (40 mg total) by  mouth 2 (two) times daily. 60 tablet 3  . pregabalin (LYRICA) 50 MG capsule Take by mouth.    . Probiotic Product (PROBIOTIC DAILY) CAPS Take 1 capsule by mouth.    . tamoxifen (NOLVADEX) 20 MG tablet TAKE 1 TABLET BY MOUTH ONCE DAILY    . vitamin E 400 UNIT capsule Take 400 Units by mouth daily.     No current facility-administered medications on file prior to visit.     Allergies  Allergen Reactions  . Other Anaphylaxis and Rash  . Silodosin Rash    Facial rash  . Ace Inhibitors Other (See Comments)    REACTION: angioedema  . Buprenorphine Hcl Other (See Comments)    "crazy"  . Morphine And Related Other (See Comments)    "crazy" "crazy"  . Codeine Rash  . Sulfonamide Derivatives Rash  . Telmisartan Rash    Family History  Problem Relation Age of Onset  . Colon cancer Cousin 66       double first cousin  . Colon cancer Cousin 53       double first cousin  . Colon polyps Father        between 49-20  . Dementia Father   . CAD Father  CABG at age 13  . Stroke Mother   . Pancreatic cancer Brother 87  . Colon polyps Brother        between 10-20  . Cancer Paternal Uncle        NOS  . Anemia Paternal Grandfather        pernicious anemia  . Breast cancer Cousin 2       double first cousin  . Breast cancer Cousin 74       paternal cousin  . Stroke Other        F 1st degree relative 74, M 1st degree relative    Social History   Socioeconomic History  . Marital status: Married    Spouse name: Not on file  . Number of children: 2  . Years of education: Not on file  . Highest education level: Not on file  Occupational History  . Occupation: Freight forwarder  Social Needs  . Financial resource strain: Not on file  . Food insecurity:    Worry: Not on file    Inability: Not on file  . Transportation needs:    Medical: Not on file    Non-medical: Not on file  Tobacco Use  . Smoking status: Never Smoker  . Smokeless tobacco: Never Used  . Tobacco comment:  Regular Exercise - Yes  Substance and Sexual Activity  . Alcohol use: No  . Drug use: No  . Sexual activity: Yes  Lifestyle  . Physical activity:    Days per week: Not on file    Minutes per session: Not on file  . Stress: Not on file  Relationships  . Social connections:    Talks on phone: Not on file    Gets together: Not on file    Attends religious service: Not on file    Active member of club or organization: Not on file    Attends meetings of clubs or organizations: Not on file    Relationship status: Not on file  Other Topics Concern  . Not on file  Social History Narrative   She lives with husband.     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 [place] [employer].  All other ROS are negative.  BP 134/86   Pulse 75   Temp 97.9 F (36.6 C) (Oral)   Resp 16   Ht 5' 6.75" (1.695 m)   Wt 188 lb 6.4 oz (85.5 kg)   SpO2 99%   BMI 29.73 kg/m   Physical Exam  Constitutional: She is oriented to person, place, and time. She appears well-developed and well-nourished.  HENT:  Head: Normocephalic and atraumatic.  Right Ear: External ear normal.  Left Ear: External ear normal.  Nose: Nose normal.  Mouth/Throat: Oropharynx is clear and moist.  Eyes: Pupils are equal, round, and reactive to light. Conjunctivae are normal.  Neck: Neck supple.  Cardiovascular: Normal rate, regular rhythm, normal heart sounds and intact distal pulses.  Pulmonary/Chest: Effort normal and breath sounds normal. No stridor. No respiratory distress. She has no wheezes. She has no rales. She exhibits no tenderness.  Neurological: She is alert and oriented to person, place, and time.  Psychiatric: She has a normal mood and affect.  Vitals reviewed.  Assessment/Plan: 1. Preoperative clearance EKG obtained today with sinus bradycardia at rate of 58 bpm. Asymptomatic. Exam and vitals are good. Will check CBC, CMP, PT/IN and urine culture.  Clearance to  be granted pending results. Will need to hold Naproxen 5 days prior to surgery as directed by specialist.  - EKG 12-Lead - CBC w/Diff - Urine Culture - Comp Met (CMET) - INR/PT   Leeanne Rio, PA-C

## 2018-02-08 LAB — URINE CULTURE
MICRO NUMBER: 91100862
SPECIMEN QUALITY: ADEQUATE

## 2018-02-09 ENCOUNTER — Other Ambulatory Visit: Payer: Self-pay | Admitting: Physician Assistant

## 2018-02-09 DIAGNOSIS — N390 Urinary tract infection, site not specified: Secondary | ICD-10-CM

## 2018-02-09 MED ORDER — CEPHALEXIN 500 MG PO CAPS: 500.0000 mg | ORAL_CAPSULE | Freq: Two times a day (BID) | ORAL | 0 refills | Status: AC

## 2018-02-10 DIAGNOSIS — M4726 Other spondylosis with radiculopathy, lumbar region: Secondary | ICD-10-CM | POA: Diagnosis not present

## 2018-02-10 DIAGNOSIS — M5431 Sciatica, right side: Secondary | ICD-10-CM | POA: Diagnosis not present

## 2018-02-10 DIAGNOSIS — M545 Low back pain: Secondary | ICD-10-CM | POA: Diagnosis not present

## 2018-02-10 DIAGNOSIS — M4150 Other secondary scoliosis, site unspecified: Secondary | ICD-10-CM | POA: Diagnosis not present

## 2018-02-10 DIAGNOSIS — Z4689 Encounter for fitting and adjustment of other specified devices: Secondary | ICD-10-CM | POA: Diagnosis not present

## 2018-02-16 DIAGNOSIS — Z01812 Encounter for preprocedural laboratory examination: Secondary | ICD-10-CM | POA: Diagnosis not present

## 2018-02-19 ENCOUNTER — Other Ambulatory Visit (INDEPENDENT_AMBULATORY_CARE_PROVIDER_SITE_OTHER): Payer: Medicare HMO

## 2018-02-19 DIAGNOSIS — N390 Urinary tract infection, site not specified: Secondary | ICD-10-CM | POA: Diagnosis not present

## 2018-02-20 LAB — URINE CULTURE
MICRO NUMBER:: 91158362
SPECIMEN QUALITY: ADEQUATE

## 2018-02-23 DIAGNOSIS — M4327 Fusion of spine, lumbosacral region: Secondary | ICD-10-CM | POA: Diagnosis not present

## 2018-02-23 DIAGNOSIS — Z981 Arthrodesis status: Secondary | ICD-10-CM | POA: Diagnosis not present

## 2018-02-23 DIAGNOSIS — M545 Low back pain: Secondary | ICD-10-CM | POA: Diagnosis not present

## 2018-02-23 DIAGNOSIS — M5431 Sciatica, right side: Secondary | ICD-10-CM | POA: Diagnosis not present

## 2018-02-23 DIAGNOSIS — M4726 Other spondylosis with radiculopathy, lumbar region: Secondary | ICD-10-CM | POA: Diagnosis not present

## 2018-02-23 DIAGNOSIS — Z79899 Other long term (current) drug therapy: Secondary | ICD-10-CM | POA: Diagnosis not present

## 2018-02-23 DIAGNOSIS — M48062 Spinal stenosis, lumbar region with neurogenic claudication: Secondary | ICD-10-CM | POA: Diagnosis not present

## 2018-02-23 DIAGNOSIS — M4186 Other forms of scoliosis, lumbar region: Secondary | ICD-10-CM | POA: Diagnosis not present

## 2018-02-23 DIAGNOSIS — M4150 Other secondary scoliosis, site unspecified: Secondary | ICD-10-CM | POA: Diagnosis not present

## 2018-02-23 DIAGNOSIS — M4156 Other secondary scoliosis, lumbar region: Secondary | ICD-10-CM | POA: Diagnosis not present

## 2018-02-23 DIAGNOSIS — I10 Essential (primary) hypertension: Secondary | ICD-10-CM | POA: Diagnosis not present

## 2018-03-10 DIAGNOSIS — M4326 Fusion of spine, lumbar region: Secondary | ICD-10-CM | POA: Diagnosis not present

## 2018-03-14 ENCOUNTER — Other Ambulatory Visit: Payer: Self-pay | Admitting: Family Medicine

## 2018-03-21 IMAGING — DX DG CHEST 2V
2 series · 2 of 2 positions shown · non-contrast
Comparison: CT 08/29/2014.  Chest x-ray 08/28/2014 .

CLINICAL DATA: Sinusitis.  Bronchitis.

EXAM:
CHEST  2 VIEW

[chest pa]
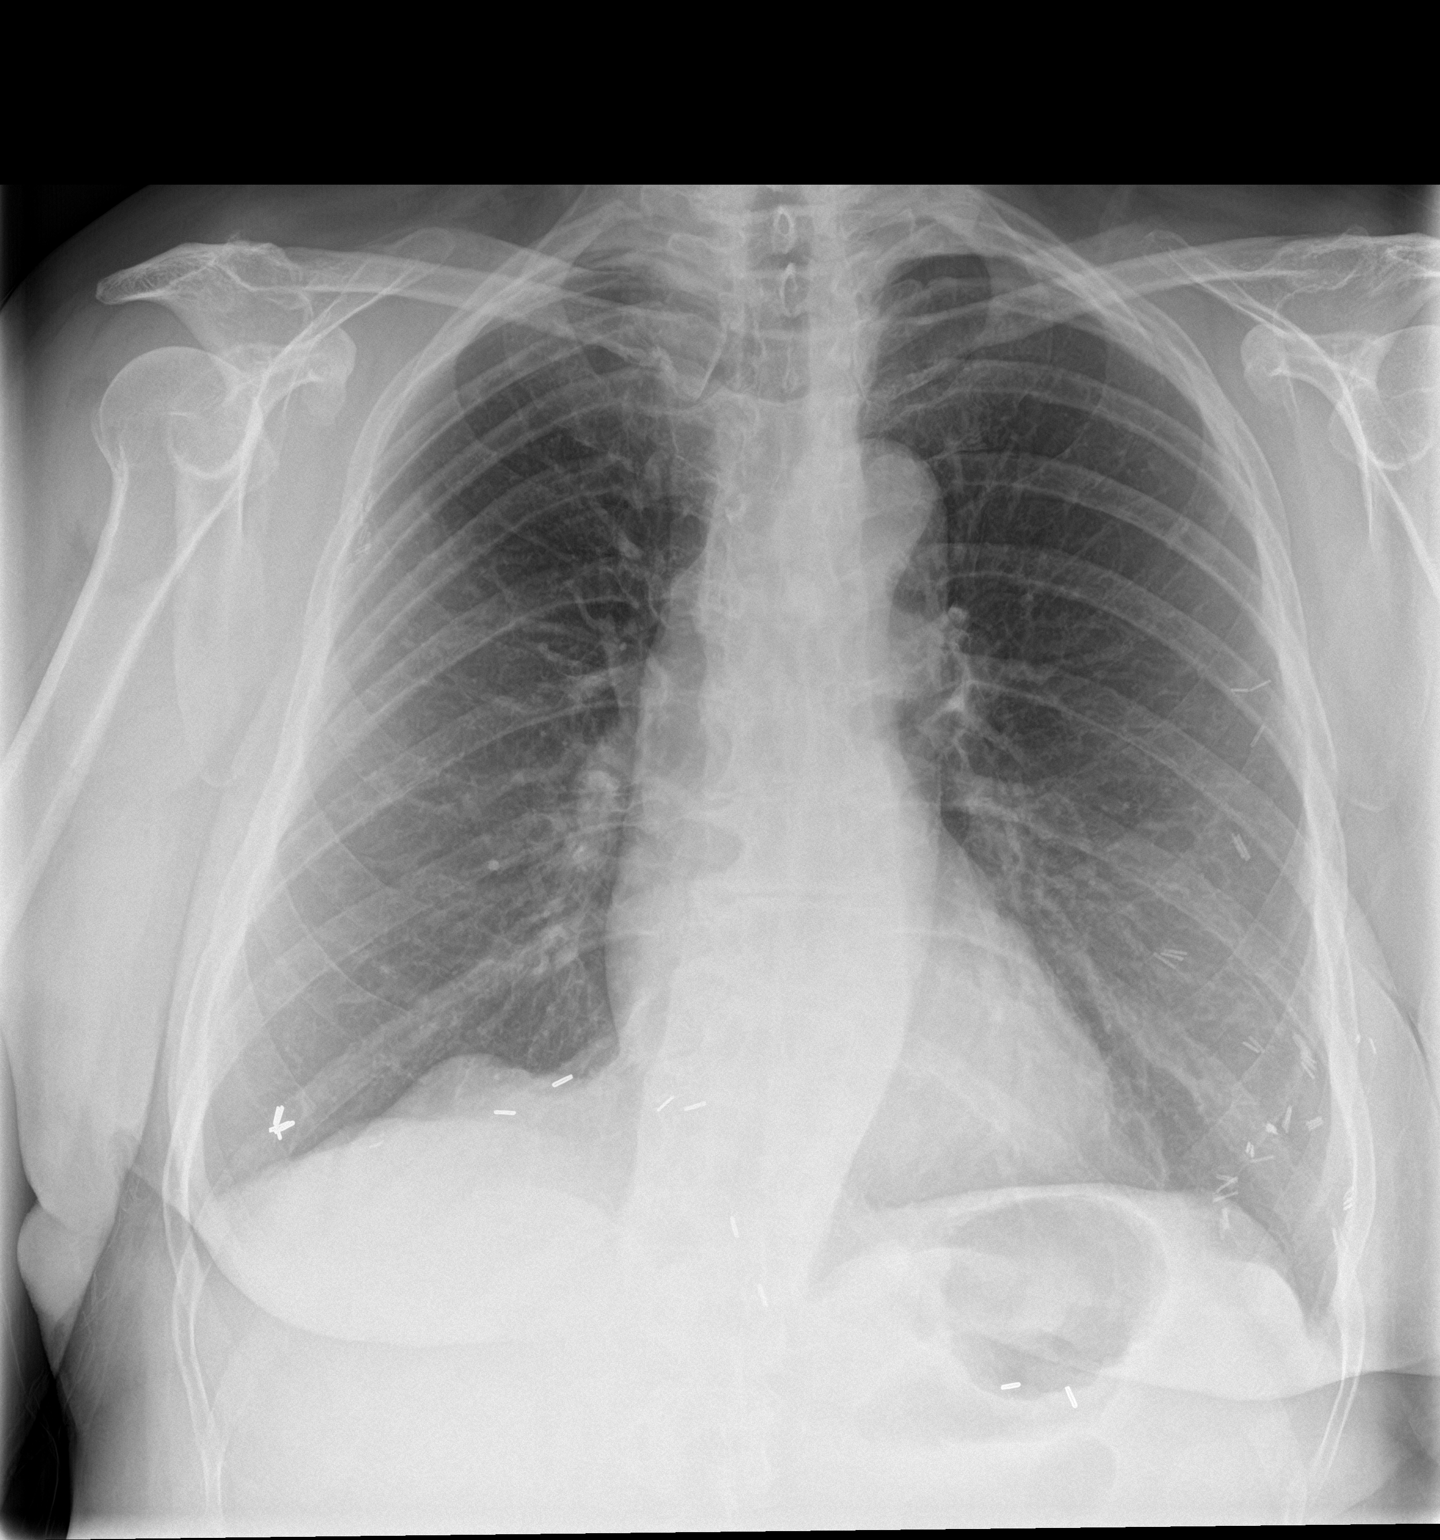

[chest lat]
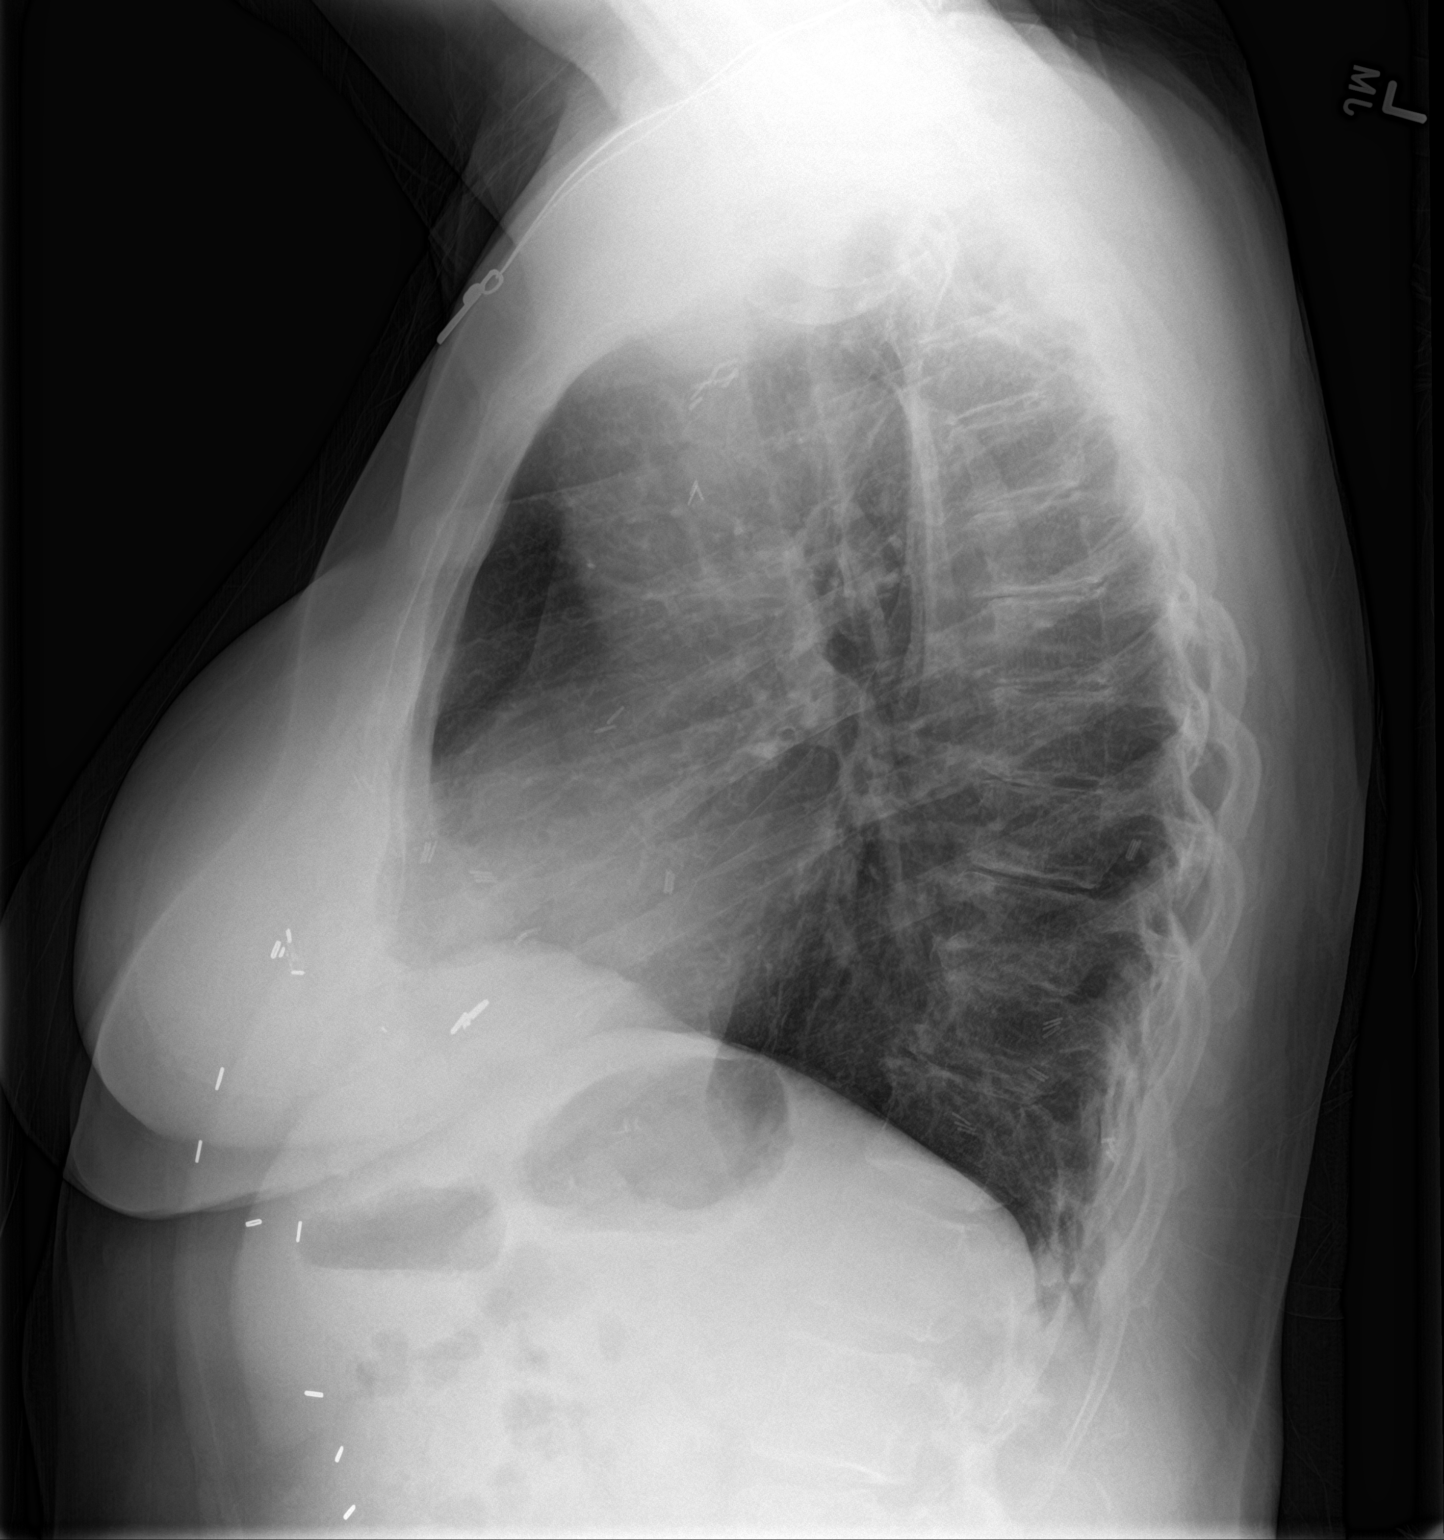

[2 of 2 positions shown; findings below may reference images not displayed]

FINDINGS: Mediastinum hilar structures normal. Lungs are clear. Heart size
normal. Surgical clips noted over the chest. No pleural effusion or
pneumothorax.
IMPRESSION: No acute cardiopulmonary disease.

## 2018-03-27 ENCOUNTER — Ambulatory Visit
Admission: RE | Admit: 2018-03-27 | Discharge: 2018-03-27 | Disposition: A | Discharge status: Home / Self Care | Payer: Medicare HMO | Source: Ambulatory Visit | Attending: Physician Assistant | Admitting: Physician Assistant

## 2018-03-27 DIAGNOSIS — M85851 Other specified disorders of bone density and structure, right thigh: Secondary | ICD-10-CM | POA: Diagnosis not present

## 2018-03-27 DIAGNOSIS — Z780 Asymptomatic menopausal state: Secondary | ICD-10-CM | POA: Diagnosis not present

## 2018-03-27 DIAGNOSIS — E2839 Other primary ovarian failure: Secondary | ICD-10-CM

## 2018-04-06 ENCOUNTER — Ambulatory Visit: Payer: Medicare HMO | Admitting: Physician Assistant

## 2018-04-06 ENCOUNTER — Encounter: Payer: Self-pay | Admitting: Internal Medicine

## 2018-04-06 ENCOUNTER — Encounter: Payer: Self-pay | Admitting: Physician Assistant

## 2018-04-06 VITALS — BP 124/70 | HR 74 | Ht 66.75 in | Wt 181.1 lb

## 2018-04-06 DIAGNOSIS — K209 Esophagitis, unspecified without bleeding: Secondary | ICD-10-CM

## 2018-04-06 DIAGNOSIS — R1013 Epigastric pain: Secondary | ICD-10-CM

## 2018-04-06 NOTE — Progress Notes (Signed)
Subjective:    Patient ID: [name], female    DOB: [birth year], 72 y.o.   MRN: [id number]  HPI Aviannah is a pleasant 72 year old white female known to Dr. Carlean Purl.  She comes in today with 6 to 7-week history of epigastric pain which is been somewhat progressive and now constant.  She describes it as a burning type pain. She underwent L5-S1 laminectomy in September 2019 and symptoms started after her surgery.  She does have history of chronic GERD, is status post laparoscopic Nissen fundoplication in 4098.  She had done well without any need for medication for heartburn or indigestion.  However she had been placed on Protonix 40 mg p.o. daily and Zyrtec by her pulmonologist for a chronic cough and has been taking that over the past year or so. On her current symptoms started she increase her Protonix to twice a day but has not had any improvement in symptoms. She denies any NSAID use and says she is healing well after the back surgery.  She denies any dysphagia or odynophagia.  She has been constipated since surgery. He also mentions that she thinks she developed thrush postoperatively and brought that up to her surgeon prior to being discharged from the hospital.  She was given a short course of adjuvant mouthwash which she says was helpful but then symptoms of foods tasting poorly and whitish tongue returned. She also took a Z-Pak within the past month.  Other medical problems include history of breast cancer status post bilateral mastectomies, family history of colon cancer.  She has a MUTYH mutation, peripheral neuropathy fatty liver and hypertension. Colonoscopy February 2017 with 1 2 mm polyp which was an adenoma she is to follow-up in 5 years.  EGD in May 2016 done for epigastric pain with finding of a few antral polyps and an intact fundoplication.  Review of Systems Pertinent positive and negative review of systems were noted in the above HPI section.  All other review of systems was  otherwise negative.  Outpatient Encounter Medications as of 04/06/2018  Medication Sig  . calcium carbonate (TUMS - DOSED IN MG ELEMENTAL CALCIUM) 500 MG chewable tablet Chew 1 tablet by mouth daily as needed for indigestion or heartburn.  . cetirizine (ZYRTEC ALLERGY) 10 MG tablet Take 10 mg by mouth as needed.   . Cholecalciferol (VITAMIN D3) 5000 units TABS Take by mouth daily.  . citalopram (CELEXA) 20 MG tablet Take 1 tablet (20 mg total) by mouth daily.  . cyanocobalamin 2000 MCG tablet Take 5,000 mcg by mouth daily.   . furosemide (LASIX) 40 MG tablet TAKE 1 TABLET BY MOUTH ONCE DAILY  . naproxen sodium (ALEVE) 220 MG tablet Take 220 mg by mouth as needed.   . pantoprazole (PROTONIX) 40 MG tablet Take 1 tablet (40 mg total) by mouth 2 (two) times daily.  . pregabalin (LYRICA) 50 MG capsule Take by mouth.  . Probiotic Product (PROBIOTIC DAILY) CAPS Take 1 capsule by mouth.  . tamoxifen (NOLVADEX) 20 MG tablet TAKE 1 TABLET BY MOUTH ONCE DAILY  . vitamin E 400 UNIT capsule Take 400 Units by mouth daily.   No facility-administered encounter medications on file as of 04/06/2018.    Allergies  Allergen Reactions  . Other Anaphylaxis and Rash  . Silodosin Rash    Facial rash  . Ace Inhibitors Other (See Comments)    REACTION: angioedema  . Buprenorphine Hcl Other (See Comments)    "crazy"  . Morphine And Related Other (  See Comments)    "crazy" "crazy"  . Codeine Rash  . Sulfonamide Derivatives Rash  . Telmisartan Rash   Patient Active Problem List   Diagnosis Date Noted  . Episode of recurrent major depressive disorder ([place]) 12/05/2017  . Primary osteoarthritis of both hands 07/09/2017  . Primary osteoarthritis of both knees 07/09/2017  . DDD (degenerative disc disease), cervical 07/09/2017  . DDD (degenerative disc disease), lumbar 07/09/2017  . Peripheral edema 11/26/2016  . Annual physical exam 06/30/2016  . Medicare annual wellness visit, subsequent 06/30/2016  . Hx  of adenomatous polyp of colon 07/03/2015  . 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 Genetic testing 06/29/2014  . Family history of breast cancer   . Family history of colon cancer   . Family history of pancreatic cancer   . Breast cancer, right breast ([place]) 05/18/2014  . Malignant neoplasm of upper-outer quadrant of left breast in female, estrogen receptor positive ([place]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  . Peripheral neuropathy 01/01/2013  . Melanoma of upper arm ([place]) 02/10/2012  . S/P laparoscopic fundoplication 93/71/6967  . FATTY LIVER DISEASE 12/07/2009  . COAGULOPATHY 07/12/2008  . CHEST XRAY, ABNORMAL 01/04/2008  . Vitamin D deficiency 04/14/2007  . VITAMIN B12 DEFICIENCY 02/27/2007  . Essential hypertension 02/27/2007  . GERD 02/27/2007  . Fibromyalgia 02/27/2007   Social History   Socioeconomic History  . Marital status: Married    Spouse name: Not on file  . Number of children: 2  . Years of education: Not on file  . Highest education level: Not on file  Occupational History  . Occupation: Freight forwarder  Social Needs  . Financial resource strain: Not on file  . Food insecurity:    Worry: Not on file    Inability: Not on file  . Transportation needs:    Medical: Not on file    Non-medical: Not on file  Tobacco Use  . Smoking status: Never Smoker  . Smokeless tobacco: Never Used  . Tobacco comment: Regular Exercise - Yes  Substance and Sexual Activity  . Alcohol use: No  . Drug use: No  . Sexual activity: Yes  Lifestyle  . Physical activity:    Days per week: Not on file    Minutes per session: Not on file  . Stress: Not on file  Relationships  . Social connections:    Talks on phone: Not on file    Gets together: Not on file    Attends religious service: Not on file    Active member of club or organization: Not on file    Attends meetings of clubs or organizations: Not on file    Relationship status: Not on file  . Intimate partner violence:    Fear of current or ex  partner: Not on file    Emotionally abused: Not on file    Physically abused: Not on file    Forced sexual activity: Not on file  Other Topics Concern  . Not on file  Social History Narrative   She lives with husband.     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 Pinela's family history includes Anemia in her paternal grandfather; Breast cancer (age of onset: 66) in her cousin; Breast cancer (age of onset: 29) in her cousin; CAD in her father; Cancer in her paternal uncle; Colon cancer (age of onset: 95) in her cousin; Colon cancer (age of onset: 86) in her cousin; Colon  polyps in her brother and father; Dementia in her father; Pancreatic cancer (age of onset: 60) in her brother; Stroke in her mother and other.      Objective:    Vitals:   04/06/18 1009  BP: 124/70  Pulse: 74    Physical Exam; well-developed older white female in no acute distress, pleasant accompanied by her daughter Patient is wearing a back brace.  Blood pressure 124/70 pulse 74, height 5 6, weight 181, BMI 28.5 HEENT ;nontraumatic normocephalic EOMI PERRLA sclera anicteric.  Oral mucosa is moist, there is no obvious plaques consistent with candidiasis she does have white coating on her tongue.  Cardiovascular; regular rate and rhythm with S1-S2 no murmur rub or gallop, Pulmonary; clear bilaterally, Abdomen ;soft, she is tender in the epigastrium and mildly in the right upper quadrant there is no guarding or rebound no palpable mass or hepatosplenomegaly sounds are present, Rectal ;exam not done, Extremities ;no clubbing cyanosis or edema skin warm dry, Neuro psych; alert and oriented, grossly nonfocal mood and affect appropriate       Assessment & Plan:   #52 72 year old white female with several week history of epigastric pain and intermittent distal odynophagia- which has been persistent and somewhat progressive described as burning in nature.  Symptoms  onset after L5-S1 laminectomy.  Patient is status post Nissen fundoplication 7588, and had done well.  She has been on Protonix 40 mg daily over the past year or so which is being used for a chronic cough.;  Rule out gastritis, peptic ulcer disease, distal esophagitis.  Given patient's report of postoperative oral thrush, also consider esophageal candidiasis.  #2 constipation #3 family history of colon cancer and personal history of adenomatous colon polyps last colonoscopy February 2017 due for follow-up 2022  #4 history of breast cancer status post bilateral mastectomy-patient has MUTYH mutation #5 peripheral neuropathy #6.  Fatty liver #7.  Hypertension   #8 chronic cough etiology not clear -question LPR  Plan; Continue Protonix 40 mg p.o. twice daily AC breakfast and AC dinner short-term Patient will be scheduled for upper endoscopy with Dr. Carlean Purl.  Procedure was discussed in detail with the patient including indications risks and benefits and she is agreeable to proceed.  Start Mycostatin oral suspension 5 cc swish and swallow 4 times daily x14 days-if candidiasis found at EGD may want to switch to Diflucan  Will also start GI cocktail 80 cc to 3- 4 times daily as needed  We will start MiraLAX 17 g in 8 ounces of water daily and increase water intake to 60 ounces per day for constipation.   Rafael Quesada Genia Harold PA-C 04/06/2018   Cc: Brunetta Jeans, PA-C

## 2018-04-06 NOTE — Progress Notes (Signed)
Patient presents to clinic today for annual exam.  Patient is fasting for labs. She has already had her annual [place] with our Health Coach in September. Records from that visit have been reviewed.  Acute Concerns: Denies acute concerns today. Has EGD scheduled for later today.  Health Maintenance: Immunizations -- up-to-date Colonoscopy -- up-to-date Mammogram -- s/p mastectomy bilateral with reconstruction. Mammo up-to-date Bone Density -- up-to-date  Past Medical History:  Diagnosis Date  . Arthritis    "spine" (07/28/2014)  . Blood dyscrasia    bruises and bleed easily  . Breast cancer, left breast ([place]) 07/28/2014  . Breast cancer, right breast ([place]) 1995  . Chronic back pain    "all over"  . Complication of anesthesia    went to sleep easily but hard to wake up up until elbow OR in 2010  . Depression   . Dyspnea    Normal Spirometry 03/2008 EF 65% BNP normal 11/2007  . Family history of breast cancer   . Family history of colon cancer   . Family history of pancreatic cancer   . Gastric polyps   . GERD (gastroesophageal reflux disease)   . History of chicken pox   . History of hiatal hernia   . Hx of adenomatous polyp of colon 07/03/2015  . Kidney stone    right kidney   . Lichen sclerosus   . Melanoma ([place]) 2010   "right elbow; [facility]"  . Mild anxiety   . Vitamin B12 deficiency   . Vitamin D deficiency     Past Surgical History:  Procedure Laterality Date  . BREAST BIOPSY Left 04/2014  . BREAST RECONSTRUCTION WITH PLACEMENT OF TISSUE EXPANDER AND FLEX HD (ACELLULAR HYDRATED DERMIS) Left 07/28/2014   Procedure: LEFT BREAST RECONSTRUCTION PLACEMENT OF LEFT TISSUE EXPANDER ;  Surgeon: Crissie Reese, MD;  Location: [place];  Service: Plastics;  Laterality: Left;  . BREAST RECONSTRUCTION WITH PLACEMENT OF TISSUE EXPANDER AND FLEX HD (ACELLULAR HYDRATED DERMIS) Left 09/08/2014   Procedure: REMOVAL OF TISSUE EXPANDER FROM LEFT BREAST;  Surgeon: Crissie Reese, MD;   Location: [place];  Service: Plastics;  Laterality: Left;  . BUNIONECTOMY Bilateral 1970's  . COLONOSCOPY     Dr Sharlett Iles  . CYSTOSCOPY WITH RETROGRADE PYELOGRAM, URETEROSCOPY AND STENT PLACEMENT Right 06/09/2015   Procedure: CYSTOSCOPY WITH RIGHT RETROGRADE PYELOGRAM, RIGHT URETEROSCOPY AND RIGHT URTERAL STENT PLACEMENT;  Surgeon: Ardis Hughs, MD;  Location: [facility];  Service: Urology;  Laterality: Right;  . ESOPHAGOGASTRODUODENOSCOPY    . HERNIA REPAIR    . HOLMIUM LASER APPLICATION Right 0/37/0488   Procedure: HOLMIUM LASER APPLICATION;  Surgeon: Ardis Hughs, MD;  Location: [facility];  Service: Urology;  Laterality: Right;  . LATISSIMUS FLAP TO BREAST Left 09/08/2014   Procedure: LEFT LATISSIMUS FLAP TO BREAST WITH SALINE IMPLANT FOR BREAST RECONSTRUCTION;  Surgeon: Crissie Reese, MD;  Location: [place];  Service: Plastics;  Laterality: Left;  Marland Kitchen MASTECTOMY Right 1996    chemotherapy. pt. states 13 lymph nodes were removed  . MASTECTOMY COMPLETE / SIMPLE W/ SENTINEL NODE BIOPSY Left 07/28/2014  . MASTECTOMY W/ SENTINEL NODE BIOPSY Left 07/28/2014   Procedure: LEFT MASTECTOMY WITH SENTINEL LYMPH NODE MAPPING;  Surgeon: Autumn Messing III, MD;  Location: [place];  Service: General;  Laterality: Left;  Marland Kitchen MELANOMA EXCISION Right 2010   From elbow-- [facility]   . NISSEN FUNDOPLICATION  89/1694  . RECONSTRUCTION BREAST IMMEDIATE / DELAYED W/ TISSUE EXPANDER Left 07/28/2014  .  TEMPOROMANDIBULAR JOINT SURGERY Bilateral 1987  . TONSILLECTOMY      Current Outpatient Medications on File Prior to Visit  Medication Sig Dispense Refill  . calcium carbonate (TUMS - DOSED IN MG ELEMENTAL CALCIUM) 500 MG chewable tablet Chew 1 tablet by mouth daily as needed for indigestion or heartburn.    . cetirizine (ZYRTEC ALLERGY) 10 MG tablet Take 10 mg by mouth as needed.     . Cholecalciferol (VITAMIN D3) 5000 units TABS Take by mouth daily.    . citalopram (CELEXA) 20 MG tablet Take 1 tablet (20 mg total) by mouth  daily. 90 tablet 1  . cyanocobalamin 2000 MCG tablet Take 5,000 mcg by mouth daily.     . furosemide (LASIX) 40 MG tablet TAKE 1 TABLET BY MOUTH ONCE DAILY 90 tablet 1  . naproxen sodium (ALEVE) 220 MG tablet Take 220 mg by mouth as needed.     . pantoprazole (PROTONIX) 40 MG tablet Take 1 tablet (40 mg total) by mouth 2 (two) times daily. 60 tablet 3  . pregabalin (LYRICA) 150 MG capsule Take 150 mg by mouth daily.     . Probiotic Product (PROBIOTIC DAILY) CAPS Take 1 capsule by mouth.    . tamoxifen (NOLVADEX) 20 MG tablet TAKE 1 TABLET BY MOUTH ONCE DAILY    . vitamin E 400 UNIT capsule Take 400 Units by mouth daily.     No current facility-administered medications on file prior to visit.     Allergies  Allergen Reactions  . Other Anaphylaxis and Rash  . Silodosin Rash    Facial rash  . Ace Inhibitors Other (See Comments)    REACTION: angioedema  . Buprenorphine Hcl Other (See Comments)    "crazy"  . Morphine And Related Other (See Comments)    "crazy" "crazy"  . Codeine Rash  . Sulfonamide Derivatives Rash  . Telmisartan Rash    Family History  Problem Relation Age of Onset  . Colon cancer Cousin 67       double first cousin  . Colon cancer Cousin 59       double first cousin  . Colon polyps Father        between 41-20  . Dementia Father   . CAD Father        CABG at age 23  . Stroke Mother   . Pancreatic cancer Brother 71  . Colon polyps Brother        between 10-20  . Cancer Paternal Uncle        NOS  . Anemia Paternal Grandfather        pernicious anemia  . Breast cancer Cousin 9       double first cousin  . Breast cancer Cousin 74       paternal cousin  . Stroke Other        F 1st degree relative 51, M 1st degree relative    Social History   Socioeconomic History  . Marital status: Married    Spouse name: Not on file  . Number of children: 2  . Years of education: Not on file  . Highest education level: Not on file  Occupational History  .  Occupation: Freight forwarder  Social Needs  . Financial resource strain: Not on file  . Food insecurity:    Worry: Not on file    Inability: Not on file  . Transportation needs:    Medical: Not on file    Non-medical: Not on file  Tobacco  Use  . Smoking status: Never Smoker  . Smokeless tobacco: Never Used  . Tobacco comment: Regular Exercise - Yes  Substance and Sexual Activity  . Alcohol use: No  . Drug use: No  . Sexual activity: Yes  Lifestyle  . Physical activity:    Days per week: Not on file    Minutes per session: Not on file  . Stress: Not on file  Relationships  . Social connections:    Talks on phone: Not on file    Gets together: Not on file    Attends religious service: Not on file    Active member of club or organization: Not on file    Attends meetings of clubs or organizations: Not on file    Relationship status: Not on file  . Intimate partner violence:    Fear of current or ex partner: Not on file    Emotionally abused: Not on file    Physically abused: Not on file    Forced sexual activity: Not on file  Other Topics Concern  . Not on file  Social History Narrative   She lives with husband.     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  HENT: Negative for ear discharge, ear pain, hearing loss and tinnitus.   Eyes: Negative for blurred vision, double vision, photophobia and pain.  Respiratory: Negative for cough and shortness of breath.   Cardiovascular: Negative for chest pain and palpitations.  Gastrointestinal: Negative for abdominal pain, blood in stool, constipation, diarrhea, heartburn, melena, nausea and vomiting.  Genitourinary: Negative for dysuria, flank pain, frequency, hematuria and urgency.  Musculoskeletal: Negative for falls.  Neurological: Negative for dizziness, loss of consciousness and headaches.    Endo/Heme/Allergies: Negative for environmental allergies.  Psychiatric/Behavioral: Negative for depression, hallucinations, substance abuse and suicidal ideas. The patient is not nervous/anxious and does not have insomnia.    BP 122/78   Pulse 71   Temp 98 F (36.7 C) (Oral)   Resp 14   Ht _0  (1.702 m)   Wt 182 lb (82.6 kg)   SpO2 99%   BMI 28.51 kg/m   Physical Exam  Constitutional: She is oriented to person, place, and time.  HENT:  Head: Normocephalic and atraumatic.  Right Ear: Tympanic membrane, external ear and ear canal normal.  Left Ear: Tympanic membrane, external ear and ear canal normal.  Nose: Nose normal. No mucosal edema.  Mouth/Throat: Uvula is midline, oropharynx is clear and moist and mucous membranes are normal. No oropharyngeal exudate or posterior oropharyngeal erythema.  Eyes: Pupils are equal, round, and reactive to light. Conjunctivae are normal.  Neck: Neck supple. No thyromegaly present.  Cardiovascular: Normal rate, regular rhythm, normal heart sounds and intact distal pulses.  Pulmonary/Chest: Effort normal and breath sounds normal. No respiratory distress. She has no wheezes. She has no rales.  Abdominal: Soft. Bowel sounds are normal. She exhibits no distension and no mass. There is no tenderness. There is no rebound and no guarding.  Lymphadenopathy:    She has no cervical adenopathy.  Neurological: She is alert and oriented to person, place, and time. No cranial nerve deficit.  Skin: Skin is warm and dry. No rash noted.  Vitals reviewed.   Recent Results (from the past 2160 hour(s))  CBC w/Diff     Status: Abnormal   Collection Time: 02/06/18  9:16 AM  Result Value Ref Range   WBC 5.2 4.0 - 10.5 K/uL   RBC 4.49 3.87 - 5.11 Mil/uL   Hemoglobin 13.8 12.0 - 15.0 g/dL   HCT 41.0 36.0 - 46.0 %   MCV 91.4 78.0 - 100.0 fl   MCHC 33.7 30.0 - 36.0 g/dL   RDW 13.6 11.5 - 15.5 %   Platelets 149.0 (L) 150.0 - 400.0 K/uL   Neutrophils Relative %  61.9 43.0 - 77.0 %   Lymphocytes Relative 25.4 12.0 - 46.0 %   Monocytes Relative 8.0 3.0 - 12.0 %   Eosinophils Relative 3.9 0.0 - 5.0 %   Basophils Relative 0.8 0.0 - 3.0 %   Neutro Abs 3.2 1.4 - 7.7 K/uL   Lymphs Abs 1.3 0.7 - 4.0 K/uL   Monocytes Absolute 0.4 0.1 - 1.0 K/uL   Eosinophils Absolute 0.2 0.0 - 0.7 K/uL   Basophils Absolute 0.0 0.0 - 0.1 K/uL  Urine Culture     Status: Abnormal   Collection Time: 02/06/18  9:16 AM  Result Value Ref Range   MICRO NUMBER: [id number]    SPECIMEN QUALITY: ADEQUATE    Sample Source NOT GIVEN    STATUS: FINAL    ISOLATE 1: Escherichia coli (A)     Comment: Greater than 100,000 CFU/mL of Escherichia coli      Susceptibility   Escherichia coli - URINE CULTURE, REFLEX    AMOX/CLAVULANIC <=2 Sensitive     AMPICILLIN <=2 Sensitive     AMPICILLIN/SULBACTAM <=2 Sensitive     CEFAZOLIN* <=4 Not Reportable      * For infections other than uncomplicated UTIcaused by E. coli, K. pneumoniae or P. mirabilis:Cefazolin is resistant if MIC > or = 8 mcg/mL.(Distinguishing susceptible versus intermediatefor isolates with MIC < or = 4 mcg/mL requiresadditional testing.)For uncomplicated UTI caused by E. coli,K. pneumoniae or P. mirabilis: Cefazolin issusceptible if MIC <32 mcg/mL and predictssusceptible to the oral agents cefaclor, cefdinir,cefpodoxime, cefprozil, cefuroxime, cephalexinand loracarbef.    CEFEPIME <=1 Sensitive     CEFTRIAXONE <=1 Sensitive     CIPROFLOXACIN <=0.25 Sensitive     LEVOFLOXACIN <=0.12 Sensitive     ERTAPENEM <=0.5 Sensitive     GENTAMICIN <=1 Sensitive     IMIPENEM <=0.25 Sensitive     NITROFURANTOIN <=16 Sensitive     PIP/TAZO <=4 Sensitive     TOBRAMYCIN <=1 Sensitive     TRIMETH/SULFA* <=20 Sensitive      * For infections other than uncomplicated UTIcaused by E. coli, K. pneumoniae or P. mirabilis:Cefazolin is resistant if MIC > or = 8 mcg/mL.(Distinguishing susceptible versus intermediatefor isolates with MIC < or = 4  mcg/mL requiresadditional testing.)For uncomplicated UTI caused by E. coli,K. pneumoniae or P. mirabilis: Cefazolin issusceptible if MIC <32 mcg/mL and predictssusceptible to the oral agents cefaclor, cefdinir,cefpodoxime, cefprozil, cefuroxime, cephalexinand loracarbef.Legend:S = Susceptible  I = IntermediateR = Resistant  NS = Not susceptible* = Not tested  NR = Not reported**NN = See antimicrobic comments  Comp Met (CMET)     Status: None   Collection Time: 02/06/18  9:16 AM  Result Value Ref Range   Sodium 142 135 - 145 mEq/L   Potassium 4.0 3.5 - 5.1 mEq/L   Chloride 103 96 - 112 mEq/L   CO2 30 19 - 32 mEq/L   Glucose, Bld 89 70 - 99 mg/dL   BUN 15 6 - 23 mg/dL   Creatinine, Ser 0.86 0.40 - 1.20 mg/dL   Total Bilirubin 0.6 0.2 -  1.2 mg/dL   Alkaline Phosphatase 51 39 - 117 U/L   AST 15 0 - 37 U/L   ALT 12 0 - 35 U/L   Total Protein 6.2 6.0 - 8.3 g/dL   Albumin 4.0 3.5 - 5.2 g/dL   Calcium 8.6 8.4 - 10.5 mg/dL   GFR 68.93 >60.00 mL/min  INR/PT     Status: None   Collection Time: 02/06/18  9:16 AM  Result Value Ref Range   INR 1.0 0.8 - 1.0 ratio   Prothrombin Time 12.1 9.6 - 13.1 sec  Urine Culture     Status: None   Collection Time: 02/19/18  8:51 AM  Result Value Ref Range   MICRO NUMBER: [id number]    SPECIMEN QUALITY: ADEQUATE    Sample Source NOT GIVEN    STATUS: FINAL    ISOLATE 1:      Three or more organisms present, each greater than 10,000 cu/mL. May represent normal flora contamination from external genitalia. No further testing is required.   Assessment/Plan: Essential hypertension BP normotensive. Asymptomatic. Will continue to monitor. Fasting labs today.  GERD Has EGD scheduled today due to ongoing symptoms despite PPI. Gi has concerns for potential esophageal thrush. Is currently on magic mouthwash and BID Protonix.  Episode of recurrent major depressive disorder ([place]) Notes doing very well on the current regimen. Feels that she had some depressed mood  recently secondary to back surgery and not being able to do her normal activities. Notes this has resolved.     Leeanne Rio, PA-C

## 2018-04-06 NOTE — Patient Instructions (Addendum)
Continue Protonix 40 mg , by mouth twice daily with breakfast and dinner.  Take Miralax 17 grams in 8 oz of water daily.  We are sending a prescription for Gi Cocktail.  [place], [place] Phone [phone].  Ed the pharmacist at [facility] will call you  When he has this ready.  You have been scheduled for an endoscopy. Please follow written instructions given to you at your visit today. If you use inhalers (even only as needed), please bring them with you on the day of your procedure.  Normal BMI (Body Mass Index- based on height and weight) is between 23 and 30. Your BMI today is Body mass index is 28.58 kg/m. Marland Kitchen Please consider follow up  regarding your BMI with your Primary Care Provider.

## 2018-04-07 ENCOUNTER — Encounter: Payer: Self-pay | Admitting: Physician Assistant

## 2018-04-07 ENCOUNTER — Other Ambulatory Visit: Payer: Self-pay

## 2018-04-07 ENCOUNTER — Ambulatory Visit (AMBULATORY_SURGERY_CENTER): Payer: Medicare HMO | Admitting: Internal Medicine

## 2018-04-07 ENCOUNTER — Encounter: Payer: Self-pay | Admitting: Internal Medicine

## 2018-04-07 ENCOUNTER — Ambulatory Visit (INDEPENDENT_AMBULATORY_CARE_PROVIDER_SITE_OTHER): Payer: Medicare HMO | Admitting: Physician Assistant

## 2018-04-07 VITALS — BP 145/94 | HR 89 | Temp 97.3°F | Resp 15 | Ht 66.0 in | Wt 181.0 lb

## 2018-04-07 VITALS — BP 122/78 | HR 71 | Temp 98.0°F | Resp 14 | Ht 67.0 in | Wt 182.0 lb

## 2018-04-07 DIAGNOSIS — R1013 Epigastric pain: Secondary | ICD-10-CM

## 2018-04-07 DIAGNOSIS — K317 Polyp of stomach and duodenum: Secondary | ICD-10-CM

## 2018-04-07 DIAGNOSIS — R69 Illness, unspecified: Secondary | ICD-10-CM | POA: Diagnosis not present

## 2018-04-07 DIAGNOSIS — Z0000 Encounter for general adult medical examination without abnormal findings: Secondary | ICD-10-CM

## 2018-04-07 DIAGNOSIS — F339 Major depressive disorder, recurrent, unspecified: Secondary | ICD-10-CM | POA: Diagnosis not present

## 2018-04-07 DIAGNOSIS — I10 Essential (primary) hypertension: Secondary | ICD-10-CM

## 2018-04-07 DIAGNOSIS — R109 Unspecified abdominal pain: Secondary | ICD-10-CM | POA: Diagnosis not present

## 2018-04-07 DIAGNOSIS — K219 Gastro-esophageal reflux disease without esophagitis: Secondary | ICD-10-CM | POA: Diagnosis not present

## 2018-04-07 LAB — CBC WITH DIFFERENTIAL/PLATELET
BASOS PCT: 0.7 % (ref 0.0–3.0)
Basophils Absolute: 0 10*3/uL (ref 0.0–0.1)
Eosinophils Absolute: 0.2 10*3/uL (ref 0.0–0.7)
Eosinophils Relative: 3.5 % (ref 0.0–5.0)
HEMATOCRIT: 39.9 % (ref 36.0–46.0)
Hemoglobin: 13.6 g/dL (ref 12.0–15.0)
LYMPHS ABS: 1.5 10*3/uL (ref 0.7–4.0)
LYMPHS PCT: 29 % (ref 12.0–46.0)
MCHC: 34 g/dL (ref 30.0–36.0)
MCV: 91.8 fl (ref 78.0–100.0)
MONOS PCT: 7.5 % (ref 3.0–12.0)
Monocytes Absolute: 0.4 10*3/uL (ref 0.1–1.0)
NEUTROS ABS: 3 10*3/uL (ref 1.4–7.7)
Neutrophils Relative %: 59.3 % (ref 43.0–77.0)
Platelets: 187 10*3/uL (ref 150.0–400.0)
RBC: 4.34 Mil/uL (ref 3.87–5.11)
RDW: 13.5 % (ref 11.5–15.5)
WBC: 5.1 10*3/uL (ref 4.0–10.5)

## 2018-04-07 LAB — LIPID PANEL
CHOL/HDL RATIO: 5
CHOLESTEROL: 185 mg/dL (ref 0–200)
HDL: 39.1 mg/dL (ref >39.00)
LDL Cholesterol: 125 mg/dL — ABNORMAL HIGH (ref 0–99)
NonHDL: 146.04
TRIGLYCERIDES: 104 mg/dL (ref 0.0–149.0)
VLDL: 20.8 mg/dL (ref 0.0–40.0)

## 2018-04-07 LAB — COMPREHENSIVE METABOLIC PANEL
ALBUMIN: 4 g/dL (ref 3.5–5.2)
ALT: 15 U/L (ref 0–35)
AST: 20 U/L (ref 0–37)
Alkaline Phosphatase: 63 U/L (ref 39–117)
BILIRUBIN TOTAL: 0.5 mg/dL (ref 0.2–1.2)
BUN: 11 mg/dL (ref 6–23)
CALCIUM: 8.9 mg/dL (ref 8.4–10.5)
CO2: 29 mEq/L (ref 19–32)
CREATININE: 0.82 mg/dL (ref 0.40–1.20)
Chloride: 103 mEq/L (ref 96–112)
GFR: 72.8 mL/min (ref >60.00)
Glucose, Bld: 103 mg/dL — ABNORMAL HIGH (ref 70–99)
Potassium: 3.6 mEq/L (ref 3.5–5.1)
Sodium: 140 mEq/L (ref 135–145)
Total Protein: 6.9 g/dL (ref 6.0–8.3)

## 2018-04-07 LAB — HEMOGLOBIN A1C: HEMOGLOBIN A1C: 5.2 % (ref 4.6–6.5)

## 2018-04-07 MED ORDER — SODIUM CHLORIDE 0.9 % IV SOLN: 500.0000 mL | Freq: Once | INTRAVENOUS | Status: DC

## 2018-04-07 NOTE — Patient Instructions (Signed)
YOU HAD AN ENDOSCOPIC PROCEDURE TODAY AT [facility]:   Refer to the procedure report that was given to you for any specific questions about what was found during the examination.  If the procedure report does not answer your questions, please call your gastroenterologist to clarify.  If you requested that your care partner not be given the details of your procedure findings, then the procedure report has been included in a sealed envelope for you to review at your convenience later.  YOU SHOULD EXPECT: Some feelings of bloating in the abdomen. Passage of more gas than usual.  Walking can help get rid of the air that was put into your GI tract during the procedure and reduce the bloating. If you had a lower endoscopy (such as a colonoscopy or flexible sigmoidoscopy) you may notice spotting of blood in your stool or on the toilet paper. If you underwent a bowel prep for your procedure, you may not have a normal bowel movement for a few days.  Please Note:  You might notice some irritation and congestion in your nose or some drainage.  This is from the oxygen used during your procedure.  There is no need for concern and it should clear up in a day or so.  SYMPTOMS TO REPORT IMMEDIATELY:   Following upper endoscopy (EGD)  Vomiting of blood or coffee ground material  New chest pain or pain under the shoulder blades  Painful or persistently difficult swallowing  New shortness of breath  Fever of 100F or higher  Black, tarry-looking stools  For urgent or emergent issues, a gastroenterologist can be reached at any hour by calling [phone].   DIET:  We do recommend a small meal at first, but then you may proceed to your regular diet.  Drink plenty of fluids but you should avoid alcoholic beverages for 24 hours.  MEDICATIONS: Continue present medications.  Will order CT abdomen/pelvis with contrast to evaluate bilateral upper abdominal pain and tenderness. 2 bottles of contrast  sent home with patient. Dr. Celesta Aver nurse will call her tomorrow to schedule this test.  ACTIVITY:  You should plan to take it easy for the rest of today and you should NOT DRIVE or use heavy machinery until tomorrow (because of the sedation medicines used during the test).    FOLLOW UP: Our staff will call the number listed on your records the next business day following your procedure to check on you and address any questions or concerns that you may have regarding the information given to you following your procedure. If we do not reach you, we will leave a message.  However, if you are feeling well and you are not experiencing any problems, there is no need to return our call.  We will assume that you have returned to your regular daily activities without incident.  If any biopsies were taken you will be contacted by phone or by letter within the next 1-3 weeks.  Please call us at [phone] if you have not heard about the biopsies in 3 weeks.   Thank you for allowing [place] to provide for your healthcare needs today.  SIGNATURES/CONFIDENTIALITY: You and/or your care partner have signed paperwork which will be entered into your electronic medical record.  These signatures attest to the fact that that the information above on your After Visit Summary has been reviewed and is understood.  Full responsibility of the confidentiality of this discharge information lies with you and/or your care-partner.

## 2018-04-07 NOTE — Assessment & Plan Note (Signed)
Notes doing very well on the current regimen. Feels that she had some depressed mood recently secondary to back surgery and not being able to do her normal activities. Notes this has resolved.

## 2018-04-07 NOTE — Assessment & Plan Note (Signed)
Has EGD scheduled today due to ongoing symptoms despite PPI. Gi has concerns for potential esophageal thrush. Is currently on magic mouthwash and BID Protonix.

## 2018-04-07 NOTE — Patient Instructions (Signed)
Please go to the lab for blood work.   Our office will call you with your results unless you have chosen to receive results via MyChart.  If your blood work is normal we will follow-up each year for physicals and as scheduled for chronic medical problems.  If anything is abnormal we will treat accordingly and get you in for a follow-up.   Preventive Care 72 Years and Older, Female Preventive care refers to lifestyle choices and visits with your health care provider that can promote health and wellness. What does preventive care include?  A yearly physical exam. This is also called an annual well check.  Dental exams once or twice a year.  Routine eye exams. Ask your health care provider how often you should have your eyes checked.  Personal lifestyle choices, including: ? Daily care of your teeth and gums. ? Regular physical activity. ? Eating a healthy diet. ? Avoiding tobacco and drug use. ? Limiting alcohol use. ? Practicing safe sex. ? Taking low-dose aspirin every day. ? Taking vitamin and mineral supplements as recommended by your health care provider. What happens during an annual well check? The services and screenings done by your health care provider during your annual well check will depend on your age, overall health, lifestyle risk factors, and family history of disease. Counseling Your health care provider may ask you questions about your:  Alcohol use.  Tobacco use.  Drug use.  Emotional well-being.  Home and relationship well-being.  Sexual activity.  Eating habits.  History of falls.  Memory and ability to understand (cognition).  Work and work Statistician.  Reproductive health.  Screening You may have the following tests or measurements:  Height, weight, and BMI.  Blood pressure.  Lipid and cholesterol levels. These may be checked every 5 years, or more frequently if you are over 53 years old.  Skin check.  Lung cancer screening. You  may have this screening every year starting at age 75 if you have a 30-pack-year history of smoking and currently smoke or have quit within the past 15 years.  Fecal occult blood test (FOBT) of the stool. You may have this test every year starting at age 32.  Flexible sigmoidoscopy or colonoscopy. You may have a sigmoidoscopy every 5 years or a colonoscopy every 10 years starting at age 110.  Hepatitis C blood test.  Hepatitis B blood test.  Sexually transmitted disease (STD) testing.  Diabetes screening. This is done by checking your blood sugar (glucose) after you have not eaten for a while (fasting). You may have this done every 1-3 years.  Bone density scan. This is done to screen for osteoporosis. You may have this done starting at age 74.  Mammogram. This may be done every 1-2 years. Talk to your health care provider about how often you should have regular mammograms.  Talk with your health care provider about your test results, treatment options, and if necessary, the need for more tests. Vaccines Your health care provider may recommend certain vaccines, such as:  Influenza vaccine. This is recommended every year.  Tetanus, diphtheria, and acellular pertussis (Tdap, Td) vaccine. You may need a Td booster every 10 years.  Varicella vaccine. You may need this if you have not been vaccinated.  Zoster vaccine. You may need this after age 13.  Measles, mumps, and rubella (MMR) vaccine. You may need at least one dose of MMR if you were born in 1957 or later. You may also need a second  dose.  Pneumococcal 13-valent conjugate (PCV13) vaccine. One dose is recommended after age 70.  Pneumococcal polysaccharide (PPSV23) vaccine. One dose is recommended after age 74.  Meningococcal vaccine. You may need this if you have certain conditions.  Hepatitis A vaccine. You may need this if you have certain conditions or if you travel or work in places where you may be exposed to hepatitis  A.  Hepatitis B vaccine. You may need this if you have certain conditions or if you travel or work in places where you may be exposed to hepatitis B.  Haemophilus influenzae type b (Hib) vaccine. You may need this if you have certain conditions.  Talk to your health care provider about which screenings and vaccines you need and how often you need them. This information is not intended to replace advice given to you by your health care provider. Make sure you discuss any questions you have with your health care provider. Document Released: 06/09/2015 Document Revised: 01/31/2016 Document Reviewed: 03/14/2015 Elsevier Interactive Patient Education  [employer]. .

## 2018-04-07 NOTE — Op Note (Signed)
[facility] Patient Name: [name] Procedure Date: 04/07/2018 4:57 PM MRN: [id number] Endoscopist: Gatha Mayer , MD Age: 72 Referring MD:  Date of Birth: 12/22/1945 Gender: Female Account #: [id number] Procedure:                Upper GI endoscopy Indications:              Upper abdominal pain Medicines:                Propofol per Anesthesia, Monitored Anesthesia Care Procedure:                Pre-Anesthesia Assessment:                           - Prior to the procedure, a History and Physical                            was performed, and patient medications and                            allergies were reviewed. The patient's tolerance of                            previous anesthesia was also reviewed. The risks                            and benefits of the procedure and the sedation                            options and risks were discussed with the patient.                            All questions were answered, and informed consent                            was obtained. Prior Anticoagulants: The patient has                            taken no previous anticoagulant or antiplatelet                            agents. ASA Grade Assessment: III - A patient with                            severe systemic disease. After reviewing the risks                            and benefits, the patient was deemed in                            satisfactory condition to undergo the procedure.                           After obtaining informed consent, the endoscope was  passed under direct vision. Throughout the                            procedure, the patient's blood pressure, pulse, and                            oxygen saturations were monitored continuously. The                            Endoscope was introduced through the mouth, and                            advanced to the second part of duodenum. The upper                            GI  endoscopy was accomplished without difficulty.                            The patient tolerated the procedure fairly well. Scope In: Scope Out: Findings:                 Multiple diminutive fundic gland polyps were found                            in the gastric fundus and in the gastric body.                           The exam was otherwise without abnormality.                           The cardia and gastric fundus were normal on                            retroflexion. Complications:            No immediate complications. Estimated Blood Loss:     Estimated blood loss: none. Impression:               - Multiple gastric polyps.                           - The examination was otherwise normal.                           - No specimens collected. Recommendation:           - Patient has a contact number available for                            emergencies. The signs and symptoms of potential                            delayed complications were discussed with the                            patient. Return to normal activities tomorrow.  Written discharge instructions were provided to the                            patient.                           - Resume previous diet.                           - Continue present medications.                           - Will order CT abdomen/pelvis with contrast to                            evaluate bilateral upper abdominal pain and                            tenderness                           Had recent BUN creatinne that are ok Gatha Mayer, MD 04/07/2018 5:18:25 PM This report has been signed electronically.

## 2018-04-07 NOTE — Progress Notes (Signed)
Pt's states no medical or surgical changes since previsit or office visit. 

## 2018-04-07 NOTE — Assessment & Plan Note (Signed)
BP normotensive. Asymptomatic. Will continue to monitor. Fasting labs today.

## 2018-04-08 ENCOUNTER — Telehealth: Payer: Self-pay

## 2018-04-08 ENCOUNTER — Other Ambulatory Visit: Payer: Self-pay

## 2018-04-08 DIAGNOSIS — R1010 Upper abdominal pain, unspecified: Secondary | ICD-10-CM

## 2018-04-08 NOTE — Telephone Encounter (Signed)
  Follow up Call-  Call back number 04/07/2018  Post procedure Call Back phone  # [phone]  Permission to leave phone message Yes  Some recent data might be hidden     Patient questions:  Do you have a fever, pain , or abdominal swelling? No. Pain Score  0 *  Have you tolerated food without any problems? Yes.    Have you been able to return to your normal activities? Yes.    Do you have any questions about your discharge instructions: Diet   No. Medications  Yes.   Follow up visit  No.  Do you have questions or concerns about your Care? No.  Actions: * If pain score is 4 or above: No action needed, pain <4.  Patient states that she is doing well, but still has the chest and upper abdomen discomfort that she had prior to her procedure.  Asked if she should continue to take the "numbing medicine" that was prescribed by Amy PA.  Advised that if it helpful, then she should continue it.  She agreed.

## 2018-04-08 NOTE — Telephone Encounter (Addendum)
Called and spoke with pt-advised patient of upper endo results and MD recommendations; pt agrees with plan of care and scheduled CT abd/pelvis per MD recommendation for 04/16/18 at 2:15 pm with arrival time of 2:00 pm;  Pt received contrast from [facility] on day of upper endo;  Letter with information sent to patient via MyChart and by mail; pt verbalized understanding of MD recommendations and schedule; pt advised to call back if questions/concerns arise;

## 2018-04-13 ENCOUNTER — Ambulatory Visit: Payer: Self-pay

## 2018-04-13 DIAGNOSIS — H9212 Otorrhea, left ear: Secondary | ICD-10-CM | POA: Diagnosis not present

## 2018-04-13 NOTE — Telephone Encounter (Signed)
Incoming call from Patient with a complaint noting dried blood in her left ear canal. Patient states that she had endoscopy via Dr. Silvano Rusk  Last week. Since procedure Patient states that her left ear feels stopped up.  Hard to hear over the weekend. More so this morning.  Patient notices blood tinged fluid this am.  And over the weekend.  Cleaded the outer ear with a qtip. Did not penetrate the ear canal.  The drainage was dried.  First notice the drainage Sunday.  Patient rates the pain as mild.  A slight discomfort.   Other symptoms include Head stopped up as if she has a head cold.  Denies  Fever, headache,dizziness,vomiting, or runny nose.  Reviewed Care advice with Patient. Voiced understanding. Patient states she lives in [place].  Will look for an Urgent Care in [facility]. Will notify PCP office.       Reason for Disposition . Severe neck pain  Answer Assessment - Initial Assessment Questions 1. LOCATION: "Which ear is involved?"      Left  2. COLOR: "What is the color of the discharge?"      bloody 3. CONSISTENCY: "How runny is the discharge? Could it be water?"      Dried small drainage 4. ONSET: "When did you first notice the discharge?"     yesterday 5. PAIN: "Is there any earache?" "How bad is it?"  (Scale 1-10; or mild, moderate, severe)     Discomfort mild ear stopped up 6. OBJECTS: "Any use of q-tips or have you inserted anything else in your ear?"     qtip at ouuter edge 7. OTHER SYMPTOMS: "Do you have any other symptoms?" (e.g., headache, fever, dizziness, vomiting, runny nose)     Head stopped up head cold 8. PREGNANCY: "Is there any chance you are pregnant?" "When was your last menstrual period?"     *No Answer*  Protocols used: EAR INJURY-A-AH, EAR - DISCHARGE-A-AH

## 2018-04-15 ENCOUNTER — Other Ambulatory Visit: Payer: Self-pay | Admitting: Pulmonary Disease

## 2018-04-16 ENCOUNTER — Inpatient Hospital Stay: Admission: RE | Admit: 2018-04-16 | Payer: Medicare HMO | Source: Ambulatory Visit

## 2018-04-20 ENCOUNTER — Other Ambulatory Visit: Payer: Self-pay

## 2018-04-20 ENCOUNTER — Encounter: Payer: Self-pay | Admitting: Physician Assistant

## 2018-04-20 ENCOUNTER — Ambulatory Visit (INDEPENDENT_AMBULATORY_CARE_PROVIDER_SITE_OTHER): Payer: Medicare HMO | Admitting: Physician Assistant

## 2018-04-20 VITALS — BP 138/82 | HR 77 | Temp 98.6°F | Resp 14 | Ht 67.0 in | Wt 182.0 lb

## 2018-04-20 DIAGNOSIS — J019 Acute sinusitis, unspecified: Secondary | ICD-10-CM

## 2018-04-20 DIAGNOSIS — B9689 Other specified bacterial agents as the cause of diseases classified elsewhere: Secondary | ICD-10-CM | POA: Diagnosis not present

## 2018-04-20 MED ORDER — DOXYCYCLINE HYCLATE 100 MG PO CAPS: 100.0000 mg | ORAL_CAPSULE | Freq: Two times a day (BID) | ORAL | 0 refills | Status: DC

## 2018-04-20 MED ORDER — BENZONATATE 100 MG PO CAPS: 100.0000 mg | ORAL_CAPSULE | Freq: Two times a day (BID) | ORAL | 0 refills | Status: DC | PRN

## 2018-04-20 NOTE — Progress Notes (Signed)
Patient presents to clinic today c/o > 1 week of head congestion, sinus pressure, sinus pain, nasal discharge (thick) and L ear pain. Was seen at Urgent Care last week and noted to have a left external ear infection. Patient was started on Ofloxacin otic.   Past Medical History:  Diagnosis Date  . Arthritis    "spine" (07/28/2014)  . Blood dyscrasia    bruises and bleed easily  . Breast cancer, left breast ([place]) 07/28/2014  . Breast cancer, right breast ([place]) 1995  . Chronic back pain    "all over"  . Complication of anesthesia    went to sleep easily but hard to wake up up until elbow OR in 2010  . Depression   . Dyspnea    Normal Spirometry 03/2008 EF 65% BNP normal 11/2007  . Family history of breast cancer   . Family history of colon cancer   . Family history of pancreatic cancer   . Gastric polyps   . GERD (gastroesophageal reflux disease)   . History of chicken pox   . History of hiatal hernia   . Hx of adenomatous polyp of colon 07/03/2015  . Kidney stone    right kidney   . Lichen sclerosus   . Melanoma ([place]) 2010   "right elbow; [facility] at [facility]"  . Mild anxiety   . Vitamin B12 deficiency   . Vitamin D deficiency     Current Outpatient Medications on File Prior to Visit  Medication Sig Dispense Refill  . calcium carbonate (TUMS - DOSED IN MG ELEMENTAL CALCIUM) 500 MG chewable tablet Chew 1 tablet by mouth daily as needed for indigestion or heartburn.    . cetirizine (ZYRTEC ALLERGY) 10 MG tablet Take 10 mg by mouth as needed.     . Cholecalciferol (VITAMIN D3) 5000 units TABS Take by mouth daily.    . citalopram (CELEXA) 20 MG tablet Take 1 tablet (20 mg total) by mouth daily. 90 tablet 1  . cyanocobalamin 2000 MCG tablet Take 5,000 mcg by mouth daily.     . furosemide (LASIX) 40 MG tablet TAKE 1 TABLET BY MOUTH ONCE DAILY 90 tablet 1  . naproxen sodium (ALEVE) 220 MG tablet Take 220 mg by mouth as needed.     Marland Kitchen ofloxacin (FLOXIN) 0.3 % OTIC solution INSTILL 10 DROPS  INTO LEFT EAR ONCE DAILY FOR 7 DAYS  0  . pantoprazole (PROTONIX) 40 MG tablet Take 1 tablet (40 mg total) by mouth 2 (two) times daily. 60 tablet 3  . pregabalin (LYRICA) 150 MG capsule Take 150 mg by mouth daily.     . Probiotic Product (PROBIOTIC DAILY) CAPS Take 1 capsule by mouth.    . tamoxifen (NOLVADEX) 20 MG tablet TAKE 1 TABLET BY MOUTH ONCE DAILY    . vitamin E 400 UNIT capsule Take 400 Units by mouth daily.     No current facility-administered medications on file prior to visit.     Allergies  Allergen Reactions  . Other Anaphylaxis and Rash  . Silodosin Rash    Facial rash  . Ace Inhibitors Other (See Comments)    REACTION: angioedema  . Buprenorphine Hcl Other (See Comments)    "crazy"  . Morphine And Related Other (See Comments)    "crazy" "crazy"  . Codeine Rash  . Sulfonamide Derivatives Rash  . Telmisartan Rash    Family History  Problem Relation Age of Onset  . Colon cancer Cousin 31  double first cousin  . Colon cancer Cousin 74       double first cousin  . Colon polyps Father        between 44-20  . Dementia Father   . CAD Father        CABG at age 92  . Stroke Mother   . Pancreatic cancer Brother 26  . Colon polyps Brother        between 10-20  . Cancer Paternal Uncle        NOS  . Anemia Paternal Grandfather        pernicious anemia  . Breast cancer Cousin 48       double first cousin  . Breast cancer Cousin 74       paternal cousin  . Stroke Other        F 1st degree relative 70, M 1st degree relative  . Esophageal cancer Neg Hx   . Stomach cancer Neg Hx     Social History   Socioeconomic History  . Marital status: Married    Spouse name: Not on file  . Number of children: 2  . Years of education: Not on file  . Highest education level: Not on file  Occupational History  . Occupation: Freight forwarder  Social Needs  . Financial resource strain: Not on file  . Food insecurity:    Worry: Not on file    Inability: Not on file  .  Transportation needs:    Medical: Not on file    Non-medical: Not on file  Tobacco Use  . Smoking status: Never Smoker  . Smokeless tobacco: Never Used  . Tobacco comment: Regular Exercise - Yes  Substance and Sexual Activity  . Alcohol use: No  . Drug use: No  . Sexual activity: Yes  Lifestyle  . Physical activity:    Days per week: Not on file    Minutes per session: Not on file  . Stress: Not on file  Relationships  . Social connections:    Talks on phone: Not on file    Gets together: Not on file    Attends religious service: Not on file    Active member of club or organization: Not on file    Attends meetings of clubs or organizations: Not on file    Relationship status: Not on file  Other Topics Concern  . Not on file  Social History Narrative   She lives with husband.     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 [place] [employer].  All other ROS are negative.  BP 138/82   Pulse 77   Temp 98.6 F (37 C) (Oral)   Resp 14   Ht 5' 7"  (1.702 m)   Wt 182 lb (82.6 kg)   SpO2 98%   BMI 28.51 kg/m   Physical Exam  Constitutional: She is oriented to person, place, and time. She appears well-developed and well-nourished.  HENT:  Head: Normocephalic and atraumatic.  Right Ear: Tympanic membrane and ear canal normal.  Left Ear: Tympanic membrane normal.  Nose: Mucosal edema and rhinorrhea present. Right sinus exhibits frontal sinus tenderness. Left sinus exhibits frontal sinus tenderness.  Mouth/Throat: Uvula is midline, oropharynx is clear and moist and mucous membranes are normal.  Blood clot noted in the ear canal. Removed via irrigation. Mild irritation noted of ear canal at site of clot. No continued active bleeding. Continue  Ofloxacin  Eyes: Conjunctivae are normal.  Neck: Neck supple.  Cardiovascular: Normal rate, regular rhythm, normal heart sounds and intact distal pulses.  Pulmonary/Chest:  Effort normal and breath sounds normal.  Neurological: She is alert and oriented to person, place, and time.  Vitals reviewed.   Recent Results (from the past 2160 hour(s))  CBC w/Diff     Status: Abnormal   Collection Time: 02/06/18  9:16 AM  Result Value Ref Range   WBC 5.2 4.0 - 10.5 K/uL   RBC 4.49 3.87 - 5.11 Mil/uL   Hemoglobin 13.8 12.0 - 15.0 g/dL   HCT 41.0 36.0 - 46.0 %   MCV 91.4 78.0 - 100.0 fl   MCHC 33.7 30.0 - 36.0 g/dL   RDW 13.6 11.5 - 15.5 %   Platelets 149.0 (L) 150.0 - 400.0 K/uL   Neutrophils Relative % 61.9 43.0 - 77.0 %   Lymphocytes Relative 25.4 12.0 - 46.0 %   Monocytes Relative 8.0 3.0 - 12.0 %   Eosinophils Relative 3.9 0.0 - 5.0 %   Basophils Relative 0.8 0.0 - 3.0 %   Neutro Abs 3.2 1.4 - 7.7 K/uL   Lymphs Abs 1.3 0.7 - 4.0 K/uL   Monocytes Absolute 0.4 0.1 - 1.0 K/uL   Eosinophils Absolute 0.2 0.0 - 0.7 K/uL   Basophils Absolute 0.0 0.0 - 0.1 K/uL  Urine Culture     Status: Abnormal   Collection Time: 02/06/18  9:16 AM  Result Value Ref Range   MICRO NUMBER: [id number]    SPECIMEN QUALITY: ADEQUATE    Sample Source NOT GIVEN    STATUS: FINAL    ISOLATE 1: Escherichia coli (A)     Comment: Greater than 100,000 CFU/mL of Escherichia coli      Susceptibility   Escherichia coli - URINE CULTURE, REFLEX    AMOX/CLAVULANIC <=2 Sensitive     AMPICILLIN <=2 Sensitive     AMPICILLIN/SULBACTAM <=2 Sensitive     CEFAZOLIN* <=4 Not Reportable      * For infections other than uncomplicated UTIcaused by E. coli, K. pneumoniae or P. mirabilis:Cefazolin is resistant if MIC > or = 8 mcg/mL.(Distinguishing susceptible versus intermediatefor isolates with MIC < or = 4 mcg/mL requiresadditional testing.)For uncomplicated UTI caused by E. coli,K. pneumoniae or P. mirabilis: Cefazolin issusceptible if MIC <32 mcg/mL and predictssusceptible to the oral agents cefaclor, cefdinir,cefpodoxime, cefprozil, cefuroxime, cephalexinand loracarbef.    CEFEPIME <=1 Sensitive      CEFTRIAXONE <=1 Sensitive     CIPROFLOXACIN <=0.25 Sensitive     LEVOFLOXACIN <=0.12 Sensitive     ERTAPENEM <=0.5 Sensitive     GENTAMICIN <=1 Sensitive     IMIPENEM <=0.25 Sensitive     NITROFURANTOIN <=16 Sensitive     PIP/TAZO <=4 Sensitive     TOBRAMYCIN <=1 Sensitive     TRIMETH/SULFA* <=20 Sensitive      * For infections other than uncomplicated UTIcaused by E. coli, K. pneumoniae or P. mirabilis:Cefazolin is resistant if MIC > or = 8 mcg/mL.(Distinguishing susceptible versus intermediatefor isolates with MIC < or = 4 mcg/mL requiresadditional testing.)For uncomplicated UTI caused by E. coli,K. pneumoniae or P. mirabilis: Cefazolin issusceptible if MIC <32 mcg/mL and predictssusceptible to the oral agents cefaclor, cefdinir,cefpodoxime, cefprozil, cefuroxime, cephalexinand loracarbef.Legend:S = Susceptible  I = IntermediateR = Resistant  NS = Not susceptible* = Not tested  NR = Not reported**NN = See antimicrobic comments  Comp Met (CMET)     Status: None   Collection Time: 02/06/18  9:16  AM  Result Value Ref Range   Sodium 142 135 - 145 mEq/L   Potassium 4.0 3.5 - 5.1 mEq/L   Chloride 103 96 - 112 mEq/L   CO2 30 19 - 32 mEq/L   Glucose, Bld 89 70 - 99 mg/dL   BUN 15 6 - 23 mg/dL   Creatinine, Ser 0.86 0.40 - 1.20 mg/dL   Total Bilirubin 0.6 0.2 - 1.2 mg/dL   Alkaline Phosphatase 51 39 - 117 U/L   AST 15 0 - 37 U/L   ALT 12 0 - 35 U/L   Total Protein 6.2 6.0 - 8.3 g/dL   Albumin 4.0 3.5 - 5.2 g/dL   Calcium 8.6 8.4 - 10.5 mg/dL   GFR 68.93 >60.00 mL/min  INR/PT     Status: None   Collection Time: 02/06/18  9:16 AM  Result Value Ref Range   INR 1.0 0.8 - 1.0 ratio   Prothrombin Time 12.1 9.6 - 13.1 sec  Urine Culture     Status: None   Collection Time: 02/19/18  8:51 AM  Result Value Ref Range   MICRO NUMBER: [id number]    SPECIMEN QUALITY: ADEQUATE    Sample Source NOT GIVEN    STATUS: FINAL    ISOLATE 1:      Three or more organisms present, each greater than 10,000  cu/mL. May represent normal flora contamination from external genitalia. No further testing is required.  CBC with Differential/Platelet     Status: None   Collection Time: 04/07/18  8:52 AM  Result Value Ref Range   WBC 5.1 4.0 - 10.5 K/uL   RBC 4.34 3.87 - 5.11 Mil/uL   Hemoglobin 13.6 12.0 - 15.0 g/dL   HCT 39.9 36.0 - 46.0 %   MCV 91.8 78.0 - 100.0 fl   MCHC 34.0 30.0 - 36.0 g/dL   RDW 13.5 11.5 - 15.5 %   Platelets 187.0 150.0 - 400.0 K/uL   Neutrophils Relative % 59.3 43.0 - 77.0 %   Lymphocytes Relative 29.0 12.0 - 46.0 %   Monocytes Relative 7.5 3.0 - 12.0 %   Eosinophils Relative 3.5 0.0 - 5.0 %   Basophils Relative 0.7 0.0 - 3.0 %   Neutro Abs 3.0 1.4 - 7.7 K/uL   Lymphs Abs 1.5 0.7 - 4.0 K/uL   Monocytes Absolute 0.4 0.1 - 1.0 K/uL   Eosinophils Absolute 0.2 0.0 - 0.7 K/uL   Basophils Absolute 0.0 0.0 - 0.1 K/uL  Comprehensive metabolic panel     Status: Abnormal   Collection Time: 04/07/18  8:52 AM  Result Value Ref Range   Sodium 140 135 - 145 mEq/L   Potassium 3.6 3.5 - 5.1 mEq/L   Chloride 103 96 - 112 mEq/L   CO2 29 19 - 32 mEq/L   Glucose, Bld 103 (H) 70 - 99 mg/dL   BUN 11 6 - 23 mg/dL   Creatinine, Ser 0.82 0.40 - 1.20 mg/dL   Total Bilirubin 0.5 0.2 - 1.2 mg/dL   Alkaline Phosphatase 63 39 - 117 U/L   AST 20 0 - 37 U/L   ALT 15 0 - 35 U/L   Total Protein 6.9 6.0 - 8.3 g/dL   Albumin 4.0 3.5 - 5.2 g/dL   Calcium 8.9 8.4 - 10.5 mg/dL   GFR 72.80 >60.00 mL/min  Lipid panel     Status: Abnormal   Collection Time: 04/07/18  8:52 AM  Result Value Ref Range   Cholesterol 185 0 - 200  mg/dL    Comment: ATP III Classification       Desirable:  < 200 mg/dL               Borderline High:  200 - 239 mg/dL          High:  > = 240 mg/dL   Triglycerides 104.0 0.0 - 149.0 mg/dL    Comment: Normal:  <150 mg/dLBorderline High:  150 - 199 mg/dL   HDL 39.10 >39.00 mg/dL   VLDL 20.8 0.0 - 40.0 mg/dL   LDL Cholesterol 125 (H) 0 - 99 mg/dL   Total CHOL/HDL Ratio 5      Comment:                Men          Women1/2 Average Risk     3.4          3.3Average Risk          5.0          4.42X Average Risk          9.6          7.13X Average Risk          15.0          11.0                       NonHDL 146.04     Comment: NOTE:  Non-HDL goal should be 30 mg/dL higher than patient's LDL goal (i.e. LDL goal of < 70 mg/dL, would have non-HDL goal of < 100 mg/dL)  Hemoglobin A1c     Status: None   Collection Time: 04/07/18  8:52 AM  Result Value Ref Range   Hgb A1c MFr Bld 5.2 4.6 - 6.5 %    Comment: Glycemic Control Guidelines for People with Diabetes:Non Diabetic:  <6%Goal of Therapy: <7%Additional Action Suggested:  >8%     Assessment/Plan: 1. Acute bacterial sinusitis Rx Doxycycline.  Increase fluids.  Rest.  Saline nasal spray.  Probiotic.  Mucinex as directed.  Humidifier in bedroom. Tessalon per orders.  Call or return to clinic if symptoms are not improving.  - doxycycline (VIBRAMYCIN) 100 MG capsule; Take 1 capsule (100 mg total) by mouth 2 (two) times daily.  Dispense: 20 capsule; Refill: 0 - benzonatate (TESSALON) 100 MG capsule; Take 1 capsule (100 mg total) by mouth 2 (two) times daily as needed for cough.  Dispense: 20 capsule; Refill: 0   Leeanne Rio, PA-C

## 2018-04-20 NOTE — Patient Instructions (Signed)
Please keep ear clean and dry. Wear a cotton ball when showering to prevent water from getting in the ear. If not resolving, please let us know and we will have an ENT assess.   Please take antibiotic as directed.  Increase fluid intake.  Use Saline nasal spray.  Take a daily multivitamin. Use the Tessalon as directed for cough.  Place a humidifier in the bedroom.  Please call or return clinic if symptoms are not improving.  Sinusitis Sinusitis is redness, soreness, and swelling (inflammation) of the paranasal sinuses. Paranasal sinuses are air pockets within the bones of your face (beneath the eyes, the middle of the forehead, or above the eyes). In healthy paranasal sinuses, mucus is able to drain out, and air is able to circulate through them by way of your nose. However, when your paranasal sinuses are inflamed, mucus and air can become trapped. This can allow bacteria and other germs to grow and cause infection. Sinusitis can develop quickly and last only a short time (acute) or continue over a long period (chronic). Sinusitis that lasts for more than 12 weeks is considered chronic.  CAUSES  Causes of sinusitis include:  Allergies.  Structural abnormalities, such as displacement of the cartilage that separates your nostrils (deviated septum), which can decrease the air flow through your nose and sinuses and affect sinus drainage.  Functional abnormalities, such as when the small hairs (cilia) that line your sinuses and help remove mucus do not work properly or are not present. SYMPTOMS  Symptoms of acute and chronic sinusitis are the same. The primary symptoms are pain and pressure around the affected sinuses. Other symptoms include:  Upper toothache.  Earache.  Headache.  Bad breath.  Decreased sense of smell and taste.  A cough, which worsens when you are lying flat.  Fatigue.  Fever.  Thick drainage from your nose, which often is green and may contain pus  (purulent).  Swelling and warmth over the affected sinuses. DIAGNOSIS  Your caregiver will perform a physical exam. During the exam, your caregiver may:  Look in your nose for signs of abnormal growths in your nostrils (nasal polyps).  Tap over the affected sinus to check for signs of infection.  View the inside of your sinuses (endoscopy) with a special imaging device with a light attached (endoscope), which is inserted into your sinuses. If your caregiver suspects that you have chronic sinusitis, one or more of the following tests may be recommended:  Allergy tests.  Nasal culture A sample of mucus is taken from your nose and sent to a lab and screened for bacteria.  Nasal cytology A sample of mucus is taken from your nose and examined by your caregiver to determine if your sinusitis is related to an allergy. TREATMENT  Most cases of acute sinusitis are related to a viral infection and will resolve on their own within 10 days. Sometimes medicines are prescribed to help relieve symptoms (pain medicine, decongestants, nasal steroid sprays, or saline sprays).  However, for sinusitis related to a bacterial infection, your caregiver will prescribe antibiotic medicines. These are medicines that will help kill the bacteria causing the infection.  Rarely, sinusitis is caused by a fungal infection. In theses cases, your caregiver will prescribe antifungal medicine. For some cases of chronic sinusitis, surgery is needed. Generally, these are cases in which sinusitis recurs more than 3 times per year, despite other treatments. HOME CARE INSTRUCTIONS   Drink plenty of water. Water helps thin the mucus so your  sinuses can drain more easily.  Use a humidifier.  Inhale steam 3 to 4 times a day (for example, sit in the bathroom with the shower running).  Apply a warm, moist washcloth to your face 3 to 4 times a day, or as directed by your caregiver.  Use saline nasal sprays to help moisten and  clean your sinuses.  Take over-the-counter or prescription medicines for pain, discomfort, or fever only as directed by your caregiver. SEEK IMMEDIATE MEDICAL CARE IF:  You have increasing pain or severe headaches.  You have nausea, vomiting, or drowsiness.  You have swelling around your face.  You have vision problems.  You have a stiff neck.  You have difficulty breathing. MAKE SURE YOU:   Understand these instructions.  Will watch your condition.  Will get help right away if you are not doing well or get worse. Document Released: 05/13/2005 Document Revised: 08/05/2011 Document Reviewed: 05/28/2011 [facility] Patient Information 2014 [place], [place].

## 2018-04-21 ENCOUNTER — Ambulatory Visit (INDEPENDENT_AMBULATORY_CARE_PROVIDER_SITE_OTHER)
Admission: RE | Admit: 2018-04-21 | Discharge: 2018-04-21 | Disposition: A | Discharge status: Home / Self Care | Payer: Medicare HMO | Source: Ambulatory Visit | Attending: Internal Medicine | Admitting: Internal Medicine

## 2018-04-21 DIAGNOSIS — R1010 Upper abdominal pain, unspecified: Secondary | ICD-10-CM | POA: Diagnosis not present

## 2018-04-21 MED ORDER — IOPAMIDOL (ISOVUE-300) INJECTION 61%
100.0000 mL | Freq: Once | INTRAVENOUS | Status: AC | PRN
  Administered 2018-04-21: 100 mL via INTRAVENOUS

## 2018-04-22 NOTE — Progress Notes (Signed)
My Chart note No cause of pain on CT scan asx fat containing bilateral inguinal hernia sxs are burning pain Began after L-S spine surgery - seems unlikely that the surgery was cause since dermatomes involved are higher than surgery site No GI cause of pain found so f/u PCP If persists ? eval for neuropathic cause of pain

## 2018-04-27 ENCOUNTER — Telehealth: Payer: Self-pay | Admitting: Internal Medicine

## 2018-04-27 NOTE — Telephone Encounter (Signed)
Patient notified of the results and recommendations.  She was able to get into her mychart

## 2018-05-14 DIAGNOSIS — M4326 Fusion of spine, lumbar region: Secondary | ICD-10-CM | POA: Diagnosis not present

## 2018-05-25 ENCOUNTER — Other Ambulatory Visit: Payer: Self-pay

## 2018-05-25 ENCOUNTER — Other Ambulatory Visit: Payer: Self-pay | Admitting: Physician Assistant

## 2018-05-25 ENCOUNTER — Ambulatory Visit (INDEPENDENT_AMBULATORY_CARE_PROVIDER_SITE_OTHER): Payer: Medicare HMO | Admitting: Physician Assistant

## 2018-05-25 ENCOUNTER — Telehealth: Payer: Self-pay | Admitting: Physician Assistant

## 2018-05-25 ENCOUNTER — Encounter: Payer: Self-pay | Admitting: Physician Assistant

## 2018-05-25 ENCOUNTER — Ambulatory Visit
Admission: RE | Admit: 2018-05-25 | Discharge: 2018-05-25 | Disposition: A | Discharge status: Home / Self Care | Payer: Medicare HMO | Source: Ambulatory Visit | Attending: Physician Assistant | Admitting: Physician Assistant

## 2018-05-25 VITALS — BP 140/84 | HR 70 | Temp 98.4°F | Resp 14 | Ht 67.0 in | Wt 178.0 lb

## 2018-05-25 DIAGNOSIS — E079 Disorder of thyroid, unspecified: Secondary | ICD-10-CM | POA: Diagnosis not present

## 2018-05-25 DIAGNOSIS — R222 Localized swelling, mass and lump, trunk: Secondary | ICD-10-CM

## 2018-05-25 DIAGNOSIS — M7989 Other specified soft tissue disorders: Secondary | ICD-10-CM

## 2018-05-25 DIAGNOSIS — K219 Gastro-esophageal reflux disease without esophagitis: Secondary | ICD-10-CM | POA: Diagnosis not present

## 2018-05-25 MED ORDER — DEXLANSOPRAZOLE 30 MG PO CPDR: 30.0000 mg | DELAYED_RELEASE_CAPSULE | Freq: Every day | ORAL | 1 refills | Status: DC

## 2018-05-25 NOTE — Telephone Encounter (Signed)
CT stat results. Available in epic at this time. Heterogeneously echoic soft tissue at the right sternoclavicular joint, not-specific on ultrasound. Differential includes infection, inflammation and less likely metastatic ds.

## 2018-05-25 NOTE — Patient Instructions (Addendum)
Please start the [place] taking as directed, along with a probiotic. Let me know if you have any issue getting the medication.  Please speak to [facility] regarding your ultrasound.  She will help to get this scheduled.   I would like to consider an assessment with Physical therapy for this ongoing issue with your hip/lower abdomen.  Imaging review looks good and pain seems to be with range of motion and positioning.  You will be contacted regarding this.

## 2018-05-25 NOTE — Telephone Encounter (Signed)
Results have been given to patient 

## 2018-05-25 NOTE — Progress Notes (Signed)
Patient presents to clinic today c/o recurrence of reflux along with nausea and an episode of non-bloody emesis. Has been followed by GI with EGD on 04/07/18 revealing gastric polyps. Symptoms were improved with her daily Protonix but feels she still had breakthrough symptoms. Notes being out of her PPI for a few weeks. Denies change to bowel habits. Denies fever or chills.  Patient also with concerns of a mass on her R clavicle first noted this morning. States it does not hurt but is just there and concerning to her. Patient with history of bilaterally Breast Ca s/p treatment and double mastectomy with reconstruction. No noted recurrence.   Past Medical History:  Diagnosis Date  . Arthritis    "spine" (07/28/2014)  . Blood dyscrasia    bruises and bleed easily  . Breast cancer, left breast ([place]) 07/28/2014  . Breast cancer, right breast ([place]) 1995  . Chronic back pain    "all over"  . Complication of anesthesia    went to sleep easily but hard to wake up up until elbow OR in 2010  . Depression   . Dyspnea    Normal Spirometry 03/2008 EF 65% BNP normal 11/2007  . Family history of breast cancer   . Family history of colon cancer   . Family history of pancreatic cancer   . Gastric polyps   . GERD (gastroesophageal reflux disease)   . History of chicken pox   . History of hiatal hernia   . Hx of adenomatous polyp of colon 07/03/2015  . Kidney stone    right kidney   . Lichen sclerosus   . Melanoma ([place]) 2010   "right elbow; [facility]"  . Mild anxiety   . Vitamin B12 deficiency   . Vitamin D deficiency     Current Outpatient Medications on File Prior to Visit  Medication Sig Dispense Refill  . citalopram (CELEXA) 20 MG tablet Take 1 tablet (20 mg total) by mouth daily. 90 tablet 1  . furosemide (LASIX) 40 MG tablet TAKE 1 TABLET BY MOUTH ONCE DAILY 90 tablet 1  . calcium carbonate (TUMS - DOSED IN MG ELEMENTAL CALCIUM) 500 MG chewable tablet Chew 1 tablet by mouth daily as  needed for indigestion or heartburn.    . cetirizine (ZYRTEC ALLERGY) 10 MG tablet Take 10 mg by mouth as needed.     . Cholecalciferol (VITAMIN D3) 5000 units TABS Take by mouth daily.    . cyanocobalamin 2000 MCG tablet Take 5,000 mcg by mouth daily.     . pantoprazole (PROTONIX) 40 MG tablet Take 1 tablet (40 mg total) by mouth 2 (two) times daily. (Patient not taking: Reported on 05/25/2018) 60 tablet 3  . vitamin E 400 UNIT capsule Take 400 Units by mouth daily.     No current facility-administered medications on file prior to visit.     Allergies  Allergen Reactions  . Other Anaphylaxis and Rash  . Silodosin Rash    Facial rash  . Ace Inhibitors Other (See Comments)    REACTION: angioedema  . Buprenorphine Hcl Other (See Comments)    "crazy"  . Morphine And Related Other (See Comments)    "crazy" "crazy"  . Codeine Rash  . Sulfonamide Derivatives Rash  . Telmisartan Rash    Family History  Problem Relation Age of Onset  . Colon cancer Cousin 64       double first cousin  . Colon cancer Cousin 75  double first cousin  . Colon polyps Father        between 32-20  . Dementia Father   . CAD Father        CABG at age 85  . Stroke Mother   . Pancreatic cancer Brother 97  . Colon polyps Brother        between 10-20  . Cancer Paternal Uncle        NOS  . Anemia Paternal Grandfather        pernicious anemia  . Breast cancer Cousin 58       double first cousin  . Breast cancer Cousin 74       paternal cousin  . Stroke Other        F 1st degree relative 24, M 1st degree relative  . Esophageal cancer Neg Hx   . Stomach cancer Neg Hx     Social History   Socioeconomic History  . Marital status: Married    Spouse name: Not on file  . Number of children: 2  . Years of education: Not on file  . Highest education level: Not on file  Occupational History  . Occupation: Freight forwarder  Social Needs  . Financial resource strain: Not on file  . Food insecurity:     Worry: Not on file    Inability: Not on file  . Transportation needs:    Medical: Not on file    Non-medical: Not on file  Tobacco Use  . Smoking status: Never Smoker  . Smokeless tobacco: Never Used  . Tobacco comment: Regular Exercise - Yes  Substance and Sexual Activity  . Alcohol use: No  . Drug use: No  . Sexual activity: Yes  Lifestyle  . Physical activity:    Days per week: Not on file    Minutes per session: Not on file  . Stress: Not on file  Relationships  . Social connections:    Talks on phone: Not on file    Gets together: Not on file    Attends religious service: Not on file    Active member of club or organization: Not on file    Attends meetings of clubs or organizations: Not on file    Relationship status: Not on file  Other Topics Concern  . Not on file  Social History Narrative   She lives with husband.     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 [place] [employer].  All other ROS are negative.  BP 140/84   Pulse 70   Temp 98.4 F (36.9 C) (Oral)   Resp 14   Ht 5\' 7"  (1.702 m)   Wt 178 lb (80.7 kg)   SpO2 98%   BMI 27.88 kg/m   Physical Exam  Recent Results (from the past 2160 hour(s))  CBC with Differential/Platelet     Status: None   Collection Time: 04/07/18  8:52 AM  Result Value Ref Range   WBC 5.1 4.0 - 10.5 K/uL   RBC 4.34 3.87 - 5.11 Mil/uL   Hemoglobin 13.6 12.0 - 15.0 g/dL   HCT 39.9 36.0 - 46.0 %   MCV 91.8 78.0 - 100.0 fl   MCHC 34.0 30.0 - 36.0 g/dL   RDW 13.5 11.5 - 15.5 %   Platelets 187.0 150.0 - 400.0 K/uL   Neutrophils Relative % 59.3 43.0 - 77.0 %   Lymphocytes Relative 29.0 12.0 -  46.0 %   Monocytes Relative 7.5 3.0 - 12.0 %   Eosinophils Relative 3.5 0.0 - 5.0 %   Basophils Relative 0.7 0.0 - 3.0 %   Neutro Abs 3.0 1.4 - 7.7 K/uL   Lymphs Abs 1.5 0.7 - 4.0 K/uL   Monocytes Absolute 0.4 0.1 - 1.0 K/uL   Eosinophils Absolute 0.2 0.0 - 0.7 K/uL    Basophils Absolute 0.0 0.0 - 0.1 K/uL  Comprehensive metabolic panel     Status: Abnormal   Collection Time: 04/07/18  8:52 AM  Result Value Ref Range   Sodium 140 135 - 145 mEq/L   Potassium 3.6 3.5 - 5.1 mEq/L   Chloride 103 96 - 112 mEq/L   CO2 29 19 - 32 mEq/L   Glucose, Bld 103 (H) 70 - 99 mg/dL   BUN 11 6 - 23 mg/dL   Creatinine, Ser 0.82 0.40 - 1.20 mg/dL   Total Bilirubin 0.5 0.2 - 1.2 mg/dL   Alkaline Phosphatase 63 39 - 117 U/L   AST 20 0 - 37 U/L   ALT 15 0 - 35 U/L   Total Protein 6.9 6.0 - 8.3 g/dL   Albumin 4.0 3.5 - 5.2 g/dL   Calcium 8.9 8.4 - 10.5 mg/dL   GFR 72.80 >60.00 mL/min  Lipid panel     Status: Abnormal   Collection Time: 04/07/18  8:52 AM  Result Value Ref Range   Cholesterol 185 0 - 200 mg/dL    Comment: ATP III Classification       Desirable:  < 200 mg/dL               Borderline High:  200 - 239 mg/dL          High:  > = 240 mg/dL   Triglycerides 104.0 0.0 - 149.0 mg/dL    Comment: Normal:  <150 mg/dLBorderline High:  150 - 199 mg/dL   HDL 39.10 >39.00 mg/dL   VLDL 20.8 0.0 - 40.0 mg/dL   LDL Cholesterol 125 (H) 0 - 99 mg/dL   Total CHOL/HDL Ratio 5     Comment:                Men          Women1/2 Average Risk     3.4          3.3Average Risk          5.0          4.42X Average Risk          9.6          7.13X Average Risk          15.0          11.0                       NonHDL 146.04     Comment: NOTE:  Non-HDL goal should be 30 mg/dL higher than patient's LDL goal (i.e. LDL goal of < 70 mg/dL, would have non-HDL goal of < 100 mg/dL)  Hemoglobin A1c     Status: None   Collection Time: 04/07/18  8:52 AM  Result Value Ref Range   Hgb A1c MFr Bld 5.2 4.6 - 6.5 %    Comment: Glycemic Control Guidelines for People with Diabetes:Non Diabetic:  <6%Goal of Therapy: <7%Additional Action Suggested:  >8%     Assessment/Plan: 1. Soft tissue mass Will obtain US to further assess. Does not feel  like a lymph node on examination, potential cystic lesion but  needs further assessment. If necessary, will proceed with CT to further assess if [place] not adequate. - US SOFT TISSUE HEAD & NECK (NON-THYROID); Future  2. Gastroesophageal reflux disease without esophagitis Start Dexilant daily. GERD diet reviewed. Start daily probiotic. Schedule follow-up with GI. - Dexlansoprazole 30 MG capsule; Take 1 capsule (30 mg total) by mouth daily.  Dispense: 30 capsule; Refill: [place], [place]

## 2018-05-29 ENCOUNTER — Ambulatory Visit
Admission: RE | Admit: 2018-05-29 | Discharge: 2018-05-29 | Disposition: A | Discharge status: Home / Self Care | Payer: Medicare HMO | Source: Ambulatory Visit | Attending: Physician Assistant | Admitting: Physician Assistant

## 2018-05-29 DIAGNOSIS — R222 Localized swelling, mass and lump, trunk: Secondary | ICD-10-CM

## 2018-05-29 DIAGNOSIS — R918 Other nonspecific abnormal finding of lung field: Secondary | ICD-10-CM | POA: Diagnosis not present

## 2018-05-29 MED ORDER — IOPAMIDOL (ISOVUE-300) INJECTION 61%
75.0000 mL | Freq: Once | INTRAVENOUS | Status: AC | PRN
  Administered 2018-05-29: 75 mL via INTRAVENOUS

## 2018-06-02 ENCOUNTER — Other Ambulatory Visit: Payer: Self-pay | Admitting: Physician Assistant

## 2018-06-02 ENCOUNTER — Other Ambulatory Visit: Payer: Self-pay | Admitting: Pulmonary Disease

## 2018-06-02 DIAGNOSIS — M19011 Primary osteoarthritis, right shoulder: Secondary | ICD-10-CM

## 2018-06-02 DIAGNOSIS — M159 Polyosteoarthritis, unspecified: Secondary | ICD-10-CM

## 2018-06-05 ENCOUNTER — Ambulatory Visit (INDEPENDENT_AMBULATORY_CARE_PROVIDER_SITE_OTHER): Payer: Medicare HMO | Admitting: Family Medicine

## 2018-06-05 ENCOUNTER — Encounter (INDEPENDENT_AMBULATORY_CARE_PROVIDER_SITE_OTHER): Payer: Self-pay | Admitting: Family Medicine

## 2018-06-05 DIAGNOSIS — R0789 Other chest pain: Secondary | ICD-10-CM

## 2018-06-05 MED ORDER — PREDNISONE 10 MG PO TABS: ORAL_TABLET | ORAL | 0 refills | Status: DC

## 2018-06-05 NOTE — Progress Notes (Signed)
Office Visit Note   Patient: [name]           Date of Birth: 1945/10/24           MRN: [id number] Visit Date: 06/05/2018 Requested by: Brunetta Jeans, PA-C [id number] [place], [place] [id number] PCP: Brunetta Jeans, PA-C  Subjective: Chief Complaint  Patient presents with  . pain right upper chest area since 05/25/18, NKI    HPI: She is a 73 year old with right chest wall swelling.  Symptoms started about 2 weeks ago, no definite injury.  She noticed 1 day while looking in the mirror that her chest was asymmetric, more swollen on the right side at the sternal clavicular area.  It has not been very painful until recently, it is a little bit uncomfortable with radiation toward her neck.  Her main concern was that she has had cancer 3 times and she wanted to be sure this was not a cancer of some sort.  She underwent ultrasound imaging followed by CT scan and now presents for evaluation.  No fevers or chills.  She states that about 3 months ago she underwent low back surgery and had difficulty waking up afterward.  She states that ever since her procedure, she has had various things seemingly go wrong with her body.  She is never been diagnosed with rheumatologic disease but she had some labs drawn in 2018 for joint swelling and the labs were negative.  She has a family history of possible rheumatoid arthritis in her mother.  Patient has had troubles with numbness in her right hand, she frequently drops things.  She was told that she might have neuropathy, etiology uncertain.  She is not diabetic.  She does have B12 deficiency and hypertension.               ROS: Other systems were reviewed and are negative.  Objective: Vital Signs: There were no vitals taken for this visit.  Physical Exam:  Chest wall: She has slight swelling of the right sternoclavicular joint with a little bit of tenderness in the sternocleidomastoid tendon and muscle on the right.  No lymphadenopathy  in the neck. Right hand: She has slight swelling of her second and third MCP joints.  She has negative Tinel's at the carpal tunnel but positive Phalen's test.  No thenar atrophy, good intrinsic hand strength.  Sharp/dull sensation is reduced in the median nerve distribution compared to the left.  Imaging: None today.  Ultrasound and CT scan images were viewed on computer.  She has cystic changes in the proximal right clavicle at the sternoclavicular joint.  Assessment & Plan: 1.  Right sternoclavicular swelling, suspicious for inflammatory arthritis such as rheumatoid. -We will draw labs again to evaluate.  Prednisone taper.  2.  Probable carpal tunnel right side -Night splint for 6 weeks.  If symptoms persist then nerve studies.   Follow-Up Instructions: No follow-ups on file.      Procedures: No procedures performed  No notes on file    PMFS History: Patient Active Problem List   Diagnosis Date Noted  . Episode of recurrent major depressive disorder ([place]) 12/05/2017  . Primary osteoarthritis of both hands 07/09/2017  . Primary osteoarthritis of both knees 07/09/2017  . DDD (degenerative disc disease), cervical 07/09/2017  . DDD (degenerative disc disease), lumbar 07/09/2017  . Peripheral edema 11/26/2016  . Medicare annual wellness visit, subsequent 06/30/2016  . Hx of adenomatous polyp of colon 07/03/2015  .  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 Family history of breast cancer   . Family history of colon cancer   . Family history of pancreatic cancer   . Breast cancer, right breast ([place]) 05/18/2014  . Malignant neoplasm of upper-outer quadrant of left breast in female, estrogen receptor positive ([place]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  . Peripheral neuropathy 01/01/2013  . Melanoma of upper arm ([place]) 02/10/2012  . S/P laparoscopic fundoplication 14/48/1856  . FATTY LIVER DISEASE 12/07/2009  . COAGULOPATHY 07/12/2008  . CHEST XRAY, ABNORMAL 01/04/2008  . Vitamin D deficiency  04/14/2007  . VITAMIN B12 DEFICIENCY 02/27/2007  . Essential hypertension 02/27/2007  . GERD 02/27/2007  . Fibromyalgia 02/27/2007   Past Medical History:  Diagnosis Date  . Arthritis    "spine" (07/28/2014)  . Blood dyscrasia    bruises and bleed easily  . Breast cancer, left breast ([place]) 07/28/2014  . Breast cancer, right breast ([place]) 1995  . Chronic back pain    "all over"  . Complication of anesthesia    went to sleep easily but hard to wake up up until elbow OR in 2010  . Depression   . Dyspnea    Normal Spirometry 03/2008 EF 65% BNP normal 11/2007  . Family history of breast cancer   . Family history of colon cancer   . Family history of pancreatic cancer   . Gastric polyps   . GERD (gastroesophageal reflux disease)   . History of chicken pox   . History of hiatal hernia   . Hx of adenomatous polyp of colon 07/03/2015  . Kidney stone    right kidney   . Lichen sclerosus   . Melanoma ([place]) 2010   "right elbow; [facility]"  . Mild anxiety   . Vitamin B12 deficiency   . Vitamin D deficiency     Family History  Problem Relation Age of Onset  . Colon cancer Cousin 18       double first cousin  . Colon cancer Cousin 59       double first cousin  . Colon polyps Father        between 83-20  . Dementia Father   . CAD Father        CABG at age 64  . Stroke Mother   . Pancreatic cancer Brother 80  . Colon polyps Brother        between 10-20  . Cancer Paternal Uncle        NOS  . Anemia Paternal Grandfather        pernicious anemia  . Breast cancer Cousin 13       double first cousin  . Breast cancer Cousin 74       paternal cousin  . Stroke Other        F 1st degree relative 64, M 1st degree relative  . Esophageal cancer Neg Hx   . Stomach cancer Neg Hx     Past Surgical History:  Procedure Laterality Date  . BREAST BIOPSY Left 04/2014  . BREAST RECONSTRUCTION WITH PLACEMENT OF TISSUE EXPANDER AND FLEX HD (ACELLULAR HYDRATED DERMIS) Left 07/28/2014    Procedure: LEFT BREAST RECONSTRUCTION PLACEMENT OF LEFT TISSUE EXPANDER ;  Surgeon: Crissie Reese, MD;  Location: [facility];  Service: Plastics;  Laterality: Left;  . BREAST RECONSTRUCTION WITH PLACEMENT OF TISSUE EXPANDER AND FLEX HD (ACELLULAR HYDRATED DERMIS) Left 09/08/2014   Procedure: REMOVAL OF TISSUE EXPANDER FROM LEFT BREAST;  Surgeon: Crissie Reese, MD;  Location: [facility];  Service: Clinical cytogeneticist;  Laterality: Left;  . BUNIONECTOMY Bilateral 1970's  . COLONOSCOPY     Dr Sharlett Iles  . CYSTOSCOPY WITH RETROGRADE PYELOGRAM, URETEROSCOPY AND STENT PLACEMENT Right 06/09/2015   Procedure: CYSTOSCOPY WITH RIGHT RETROGRADE PYELOGRAM, RIGHT URETEROSCOPY AND RIGHT URTERAL STENT PLACEMENT;  Surgeon: Ardis Hughs, MD;  Location: [facility];  Service: Urology;  Laterality: Right;  . ESOPHAGOGASTRODUODENOSCOPY    . HERNIA REPAIR    . HOLMIUM LASER APPLICATION Right 06/16/9756   Procedure: HOLMIUM LASER APPLICATION;  Surgeon: Ardis Hughs, MD;  Location: [facility];  Service: Urology;  Laterality: Right;  . LATISSIMUS FLAP TO BREAST Left 09/08/2014   Procedure: LEFT LATISSIMUS FLAP TO BREAST WITH SALINE IMPLANT FOR BREAST RECONSTRUCTION;  Surgeon: Crissie Reese, MD;  Location: [facility];  Service: Plastics;  Laterality: Left;  . LUMBAR FUSION  01/2018   L5-S1 transitional segmental laminectomy and Fusion  . MASTECTOMY Right 1996    chemotherapy. pt. states 13 lymph nodes were removed  . MASTECTOMY COMPLETE / SIMPLE W/ SENTINEL NODE BIOPSY Left 07/28/2014  . MASTECTOMY W/ SENTINEL NODE BIOPSY Left 07/28/2014   Procedure: LEFT MASTECTOMY WITH SENTINEL LYMPH NODE MAPPING;  Surgeon: Autumn Messing III, MD;  Location: [facility];  Service: General;  Laterality: Left;  Marland Kitchen MELANOMA EXCISION Right 2010   From elbow-- [facility]   . NISSEN FUNDOPLICATION  83/2549  . RECONSTRUCTION BREAST IMMEDIATE / DELAYED W/ TISSUE EXPANDER Left 07/28/2014  . TEMPOROMANDIBULAR JOINT SURGERY Bilateral 1987  . TONSILLECTOMY     Social History    Occupational History  . Occupation: Freight forwarder  Tobacco Use  . Smoking status: Never Smoker  . Smokeless tobacco: Never Used  . Tobacco comment: Regular Exercise - Yes  Substance and Sexual Activity  . Alcohol use: No  . Drug use: No  . Sexual activity: Yes

## 2018-06-08 LAB — ANA: Anti Nuclear Antibody(ANA): POSITIVE — AB

## 2018-06-08 LAB — RHEUMATOID FACTOR: Rheumatoid fact SerPl-aCnc: <14 IU/mL (ref <14)

## 2018-06-08 LAB — SEDIMENTATION RATE: Sed Rate: 14 mm/h (ref 0–30)

## 2018-06-08 LAB — ANTI-NUCLEAR AB-TITER (ANA TITER): ANA Titer 1: 1:40 {titer} — ABNORMAL HIGH

## 2018-06-08 LAB — URIC ACID: Uric Acid, Serum: 3.9 mg/dL (ref 2.5–7.0)

## 2018-06-08 LAB — CYCLIC CITRUL PEPTIDE ANTIBODY, IGG: Cyclic Citrullin Peptide Ab: <16 UNITS

## 2018-06-09 ENCOUNTER — Telehealth (INDEPENDENT_AMBULATORY_CARE_PROVIDER_SITE_OTHER): Payer: Self-pay | Admitting: Family Medicine

## 2018-06-09 NOTE — Telephone Encounter (Signed)
ANA was mildly positive.  Other labs were normal.

## 2018-06-09 NOTE — Telephone Encounter (Signed)
I called the patient. [name] did see the results in MyChart this morning. [name] will message Dr. Junius Roads after reading the info on the link, if [name] has any questions/concerns.

## 2018-06-23 ENCOUNTER — Encounter (INDEPENDENT_AMBULATORY_CARE_PROVIDER_SITE_OTHER): Payer: Self-pay | Admitting: Family Medicine

## 2018-06-23 ENCOUNTER — Telehealth (INDEPENDENT_AMBULATORY_CARE_PROVIDER_SITE_OTHER): Payer: Self-pay | Admitting: Family Medicine

## 2018-06-23 ENCOUNTER — Other Ambulatory Visit (INDEPENDENT_AMBULATORY_CARE_PROVIDER_SITE_OTHER): Payer: Self-pay | Admitting: Family Medicine

## 2018-06-23 DIAGNOSIS — R768 Other specified abnormal immunological findings in serum: Secondary | ICD-10-CM

## 2018-06-23 DIAGNOSIS — R0789 Other chest pain: Secondary | ICD-10-CM

## 2018-06-23 DIAGNOSIS — R682 Dry mouth, unspecified: Secondary | ICD-10-CM

## 2018-06-23 DIAGNOSIS — M2550 Pain in unspecified joint: Secondary | ICD-10-CM

## 2018-06-23 NOTE — Telephone Encounter (Signed)
I called: patient will come in at 8:30 tomorrow morning to see me for blood draw only.

## 2018-06-23 NOTE — Telephone Encounter (Signed)
Pt called in left vm wanting to speak to dr.hilts nurse about her condition. Pt stated she wouldn't be available till after 1pm. Pt# [phone]

## 2018-06-23 NOTE — Telephone Encounter (Signed)
Additional labs ordered.

## 2018-06-23 NOTE — Telephone Encounter (Signed)
I called: she is not feeling any better after finishing the prednisone. Her neck is hurting on the right side, into that ear & the right side of her head. She has found 2 new raised areas, one above her right breast and one under her left arm. If she presses on harder areas (like her shin) and even softer areas (like her neck) those areas are very sore. She also complains of dry mouth every morning.  She wants to know what the next step would be: come in for ov again, more tests, referral to some other provider, or what.    OK to send a message to her through [facility] - she tried to send a message, instead of calling, but could not figure out how to do so.

## 2018-06-24 ENCOUNTER — Other Ambulatory Visit (INDEPENDENT_AMBULATORY_CARE_PROVIDER_SITE_OTHER): Payer: Self-pay

## 2018-06-24 ENCOUNTER — Ambulatory Visit (INDEPENDENT_AMBULATORY_CARE_PROVIDER_SITE_OTHER): Payer: Medicare HMO

## 2018-06-24 DIAGNOSIS — R682 Dry mouth, unspecified: Secondary | ICD-10-CM | POA: Diagnosis not present

## 2018-06-24 DIAGNOSIS — M2550 Pain in unspecified joint: Secondary | ICD-10-CM

## 2018-06-24 DIAGNOSIS — R768 Other specified abnormal immunological findings in serum: Secondary | ICD-10-CM

## 2018-06-24 DIAGNOSIS — Z79899 Other long term (current) drug therapy: Secondary | ICD-10-CM | POA: Diagnosis not present

## 2018-06-24 DIAGNOSIS — I10 Essential (primary) hypertension: Secondary | ICD-10-CM | POA: Diagnosis not present

## 2018-06-24 DIAGNOSIS — R0789 Other chest pain: Secondary | ICD-10-CM | POA: Diagnosis not present

## 2018-06-24 NOTE — Progress Notes (Signed)
The patient came in today for blood draw only. CBC/diff, CRP, thyroid panel w/TSH, TPO antibody and vitamin D 25 Hydroxy drawn per Dr. Junius Roads.

## 2018-06-25 LAB — CBC WITH DIFFERENTIAL/PLATELET
Absolute Monocytes: 421 cells/uL (ref 200–950)
BASOS PCT: 0.8 %
Basophils Absolute: 42 cells/uL (ref 0–200)
EOS ABS: 218 {cells}/uL (ref 15–500)
Eosinophils Relative: 4.2 %
HCT: 38.5 % (ref 35.0–45.0)
HEMOGLOBIN: 12.8 g/dL (ref 11.7–15.5)
Lymphs Abs: 1414 cells/uL (ref 850–3900)
MCH: 29.8 pg (ref 27.0–33.0)
MCHC: 33.2 g/dL (ref 32.0–36.0)
MCV: 89.5 fL (ref 80.0–100.0)
MONOS PCT: 8.1 %
MPV: 12.3 fL (ref 7.5–12.5)
NEUTROS ABS: 3104 {cells}/uL (ref 1500–7800)
Neutrophils Relative %: 59.7 %
Platelets: 169 10*3/uL (ref 140–400)
RBC: 4.3 10*6/uL (ref 3.80–5.10)
RDW: 13 % (ref 11.0–15.0)
Total Lymphocyte: 27.2 %
WBC: 5.2 10*3/uL (ref 3.8–10.8)

## 2018-06-25 LAB — THYROID PANEL WITH TSH
FREE THYROXINE INDEX: 1.9 (ref 1.4–3.8)
T3 Uptake: 31 % (ref 22–35)
T4, Total: 6.2 ug/dL (ref 5.1–11.9)
TSH: 2.28 m[IU]/L (ref 0.40–4.50)

## 2018-06-25 LAB — THYROID PEROXIDASE ANTIBODY: Thyroperoxidase Ab SerPl-aCnc: <1 IU/mL (ref <9)

## 2018-06-25 LAB — C-REACTIVE PROTEIN: CRP: 8.1 mg/L — ABNORMAL HIGH (ref <8.0)

## 2018-06-25 LAB — VITAMIN D 25 HYDROXY (VIT D DEFICIENCY, FRACTURES): Vit D, 25-Hydroxy: 30 ng/mL (ref 30–100)

## 2018-06-26 ENCOUNTER — Telehealth (INDEPENDENT_AMBULATORY_CARE_PROVIDER_SITE_OTHER): Payer: Self-pay | Admitting: Family Medicine

## 2018-06-26 DIAGNOSIS — R768 Other specified abnormal immunological findings in serum: Secondary | ICD-10-CM

## 2018-06-26 DIAGNOSIS — M2550 Pain in unspecified joint: Secondary | ICD-10-CM

## 2018-06-26 NOTE — Telephone Encounter (Signed)
Final results are notable for the following:  C-reactive protein, a nonspecific measure of inflammation, is elevated at 8.1.  This would go along with the positive ANA result.  CBC including white blood cell count is within normal limits.  Vitamin D is low-normal at 30.  We want this to be 50-80.  I recommend taking an additional vitamin D3 at 2000 IU daily.  Thyroid antibodies and thyroid studies are normal.

## 2018-06-27 NOTE — Addendum Note (Signed)
Addended by: Hortencia Pilar on: 06/27/2018 08:13 AM   Modules accepted: Orders

## 2018-07-03 NOTE — Progress Notes (Signed)
Office Visit Note  Patient: [name]             Date of Birth: 1945-06-27           MRN: [id number]             PCP: Delorse Limber Referring: Brunetta Jeans, PA-C Visit Date: 07/06/2018 Occupation: @GUAROCC @  Subjective:  Right sternoclavicular joint pain   History of Present Illness: [name] is a 73 y.o. female with history of positive ANA, fibromyalgia, osteoarthritis, and DDD.  She presents today to review abnormal lab work and the results of the chest CT.  She is having right sternoclavicular tenderness and swelling.  She is also having symptoms of right carpal tunnel and has been wearing a night splint as recommended by Dr. Junius Roads.  She had lumbar spine surgery in September 2019, and she reports since then it feels like her body has been under a tremendous amount of stress.  She states she has noticed hair thinning and skin dryness.  She states she has areas of dryness and itching on her skin.  She denies any known history of psoriasis.  She states she feels like her tongue has been raw.  She denies any nose sores.  She reports mouth dryness but no eye dryness.  She denies any swollen lymph nodes or recent fevers.  She denies any Raynaud's.  She denies any palpitations.  She reports pain and intermittent swelling in both hands.  She denies any feet pain.  She continues to have positive tender points related to fibromyalgia.  She has chronic neck pain and bilateral trochanteric bursitis.  She has been more sedentary since her back surgery in September.     Activities of Daily Living:  Patient reports morning stiffness for 1 hour.   Patient Denies nocturnal pain.  Difficulty dressing/grooming: Denies Difficulty climbing stairs: Reports Difficulty getting out of chair: Denies Difficulty using hands for taps, buttons, cutlery, and/or writing: Reports  Review of Systems  Constitutional: Positive for fatigue.  HENT: Positive for mouth dryness and sore tongue.  Negative for mouth sores and nose dryness.   Eyes: Negative for pain, visual disturbance and dryness.  Respiratory: Negative for cough, hemoptysis, shortness of breath and difficulty breathing.   Cardiovascular: Positive for swelling in legs/feet. Negative for chest pain, palpitations and hypertension.  Gastrointestinal: Positive for constipation. Negative for blood in stool and diarrhea.  Endocrine: Negative for increased urination.  Genitourinary: Negative for difficulty urinating and painful urination.  Musculoskeletal: Positive for arthralgias, joint pain, joint swelling, muscle weakness, morning stiffness and muscle tenderness. Negative for myalgias and myalgias.  Skin: Positive for rash. Negative for color change, pallor, hair loss, nodules/bumps, skin tightness, ulcers and sensitivity to sunlight.  Allergic/Immunologic: Negative for susceptible to infections.  Neurological: Negative for dizziness and headaches.  Hematological: Negative for swollen glands.  Psychiatric/Behavioral: Negative for depressed mood and sleep disturbance. The patient is not nervous/anxious.     PMFS History:  Patient Active Problem List   Diagnosis Date Noted  . Episode of recurrent major depressive disorder ([place]) 12/05/2017  . Primary osteoarthritis of both hands 07/09/2017  . Primary osteoarthritis of both knees 07/09/2017  . DDD (degenerative disc disease), cervical 07/09/2017  . DDD (degenerative disc disease), lumbar 07/09/2017  . Peripheral edema 11/26/2016  . Medicare annual wellness visit, subsequent 06/30/2016  . Hx of adenomatous polyp of colon 07/03/2015  . 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 Family history of breast  cancer   . Family history of colon cancer   . Family history of pancreatic cancer   . Breast cancer, right breast ([place]) 05/18/2014  . Malignant neoplasm of upper-outer quadrant of left breast in female, estrogen receptor positive ([place]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  . Peripheral  neuropathy 01/01/2013  . Melanoma of upper arm ([place]) 02/10/2012  . S/P laparoscopic fundoplication 27/51/7001  . FATTY LIVER DISEASE 12/07/2009  . COAGULOPATHY 07/12/2008  . CHEST XRAY, ABNORMAL 01/04/2008  . Vitamin D deficiency 04/14/2007  . VITAMIN B12 DEFICIENCY 02/27/2007  . Essential hypertension 02/27/2007  . GERD 02/27/2007  . Fibromyalgia 02/27/2007    Past Medical History:  Diagnosis Date  . Arthritis    "spine" (07/28/2014)  . Blood dyscrasia    bruises and bleed easily  . Breast cancer, left breast ([place]) 07/28/2014  . Breast cancer, right breast ([place]) 1995  . Chronic back pain    "all over"  . Complication of anesthesia    went to sleep easily but hard to wake up up until elbow OR in 2010  . Depression   . Dyspnea    Normal Spirometry 03/2008 EF 65% BNP normal 11/2007  . Family history of breast cancer   . Family history of colon cancer   . Family history of pancreatic cancer   . Gastric polyps   . GERD (gastroesophageal reflux disease)   . History of chicken pox   . History of hiatal hernia   . Hx of adenomatous polyp of colon 07/03/2015  . Kidney stone    right kidney   . Lichen sclerosus   . Melanoma ([place]) 2010   "right elbow; [facility]"  . Mild anxiety   . Vitamin B12 deficiency   . Vitamin D deficiency     Family History  Problem Relation Age of Onset  . Colon cancer Cousin 81       double first cousin  . Colon cancer Cousin 24       double first cousin  . Colon polyps Father        between 57-20  . Dementia Father   . CAD Father        CABG at age 64  . Stroke Mother   . Pancreatic cancer Brother 68  . Colon polyps Brother        between 10-20  . Cancer Paternal Uncle        NOS  . Anemia Paternal Grandfather        pernicious anemia  . Breast cancer Cousin 24       double first cousin  . Breast cancer Cousin 74       paternal cousin  . Stroke Other        F 1st degree relative 57, M 1st degree relative  . Esophageal cancer Neg Hx    . Stomach cancer Neg Hx    Past Surgical History:  Procedure Laterality Date  . BREAST BIOPSY Left 04/2014  . BREAST RECONSTRUCTION WITH PLACEMENT OF TISSUE EXPANDER AND FLEX HD (ACELLULAR HYDRATED DERMIS) Left 07/28/2014   Procedure: LEFT BREAST RECONSTRUCTION PLACEMENT OF LEFT TISSUE EXPANDER ;  Surgeon: Crissie Reese, MD;  Location: [place];  Service: Plastics;  Laterality: Left;  . BREAST RECONSTRUCTION WITH PLACEMENT OF TISSUE EXPANDER AND FLEX HD (ACELLULAR HYDRATED DERMIS) Left 09/08/2014   Procedure: REMOVAL OF TISSUE EXPANDER FROM LEFT BREAST;  Surgeon: Crissie Reese, MD;  Location: [place];  Service: Plastics;  Laterality: Left;  . BUNIONECTOMY Bilateral 1970's  .  COLONOSCOPY     Dr Sharlett Iles  . CYSTOSCOPY WITH RETROGRADE PYELOGRAM, URETEROSCOPY AND STENT PLACEMENT Right 06/09/2015   Procedure: CYSTOSCOPY WITH RIGHT RETROGRADE PYELOGRAM, RIGHT URETEROSCOPY AND RIGHT URTERAL STENT PLACEMENT;  Surgeon: Ardis Hughs, MD;  Location: [facility];  Service: Urology;  Laterality: Right;  . ESOPHAGOGASTRODUODENOSCOPY    . HERNIA REPAIR    . HOLMIUM LASER APPLICATION Right 10/04/2583   Procedure: HOLMIUM LASER APPLICATION;  Surgeon: Ardis Hughs, MD;  Location: [facility];  Service: Urology;  Laterality: Right;  . LATISSIMUS FLAP TO BREAST Left 09/08/2014   Procedure: LEFT LATISSIMUS FLAP TO BREAST WITH SALINE IMPLANT FOR BREAST RECONSTRUCTION;  Surgeon: Crissie Reese, MD;  Location: [place];  Service: Plastics;  Laterality: Left;  . LUMBAR FUSION  01/2018   L5-S1 transitional segmental laminectomy and Fusion  . MASTECTOMY Right 1996    chemotherapy. pt. states 13 lymph nodes were removed  . MASTECTOMY COMPLETE / SIMPLE W/ SENTINEL NODE BIOPSY Left 07/28/2014  . MASTECTOMY W/ SENTINEL NODE BIOPSY Left 07/28/2014   Procedure: LEFT MASTECTOMY WITH SENTINEL LYMPH NODE MAPPING;  Surgeon: Autumn Messing III, MD;  Location: [place];  Service: General;  Laterality: Left;  Marland Kitchen MELANOMA EXCISION Right 2010   From  elbow-- Done at [facility]   . NISSEN FUNDOPLICATION  27/7824  . RECONSTRUCTION BREAST IMMEDIATE / DELAYED W/ TISSUE EXPANDER Left 07/28/2014  . TEMPOROMANDIBULAR JOINT SURGERY Bilateral 1987  . TONSILLECTOMY     Social History   Social History Narrative   She lives with husband.     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 [employer] History  Administered Date(s) Administered  . Influenza Split 02/06/2011, 02/10/2012  . Influenza Whole 04/14/2007  . Influenza, High Dose Seasonal PF 04/06/2015, 03/18/2017, 01/29/2018  . Influenza,inj,Quad PF,6+ Mos 07/29/2014, 02/20/2016  . Pneumococcal Conjugate-13 02/20/2016  . Pneumococcal Polysaccharide-23 07/29/2014  . Tdap 01/03/2013  . Tetanus 01/04/2008  . Zoster Recombinat (Shingrix) 02/14/2018, 06/02/2018     Objective: Vital Signs: BP 139/80 (BP Location: Left Arm, Patient Position: Sitting, Cuff Size: Normal)   Pulse 79   Resp 16   Ht 5\' 7"  (1.702 m)   Wt 181 lb 12.8 oz (82.5 kg)   BMI 28.47 kg/m    Physical Exam Vitals signs and nursing note reviewed.  Constitutional:      Appearance: She is well-developed.  HENT:     Head: Normocephalic and atraumatic.  Eyes:     Conjunctiva/sclera: Conjunctivae normal.  Neck:     Musculoskeletal: Normal range of motion.  Cardiovascular:     Rate and Rhythm: Normal rate and regular rhythm.     Heart sounds: Normal heart sounds.  Pulmonary:     Effort: Pulmonary effort is normal.     Breath sounds: Normal breath sounds.  Abdominal:     General: Bowel sounds are normal.     Palpations: Abdomen is soft.  Lymphadenopathy:     Cervical: No cervical adenopathy.  Skin:    General: Skin is warm and dry.     Capillary Refill: Capillary refill takes less than 2 seconds.  Neurological:     Mental Status: She is alert and oriented to person, place, and time.  Psychiatric:        Behavior: Behavior normal.      Musculoskeletal Exam:  Generalized hyperalgesia and positive tender points on exam. C-spine limited ROM with discomfort.  No midline spinal tenderness.  No  SI joint tenderness.  Right sternoclavicular tenderness and inflammation noted.  Shoulder joints, elbow joints, wrist joints, MCPs, PIPs, and DIPs good ROM with no synovitis.  PIP and DIP synovial thickening consistent with osteoarthritis. Complete fist formation bilaterally.  Bilateral lateral epicondylitis.  Hip joints good range of motion with no discomfort.  Knee joints, ankle joints, MTPs, PIPs, DIPs good range of motion with no synovitis.  No warmth or effusion bilateral knee joints.  No tenderness or swelling of ankle joints.  No Achilles tendonitis or plantar fasciitis.  No tenderness over trochanter bursa bilaterally.  CDAI Exam: CDAI Score: Not documented Patient Global Assessment: Not documented; Provider Global Assessment: Not documented Swollen: Not documented; Tender: Not documented Joint Exam   Not documented   There is currently no information documented on the homunculus. Go to the Rheumatology activity and complete the homunculus joint exam.  Investigation: No additional findings.  Imaging: No results found.  Recent Labs: Lab Results  Component Value Date   WBC 5.2 06/24/2018   HGB 12.8 06/24/2018   PLT 169 06/24/2018   NA 140 04/07/2018   K 3.6 04/07/2018   CL 103 04/07/2018   CO2 29 04/07/2018   GLUCOSE 103 (H) 04/07/2018   BUN 11 04/07/2018   CREATININE 0.82 04/07/2018   BILITOT 0.5 04/07/2018   ALKPHOS 63 04/07/2018   AST 20 04/07/2018   ALT 15 04/07/2018   PROT 6.9 04/07/2018   ALBUMIN 4.0 04/07/2018   CALCIUM 8.9 04/07/2018   GFRAA >60 06/07/2015    Speciality Comments: No specialty comments available.  Procedures:  No procedures performed Allergies   : Other; Silodosin; Ace inhibitors; Buprenorphine hcl; Morphine and related; Codeine; Sulfonamide derivatives; and Telmisartan  Assessment / Plan:     Visit  Diagnoses: Positive ANA (antinuclear antibody) - 06/05/18: ANA 1:40 NS, RF-, CCP-, sed rate 14, uric acid 3.9, TSH 2.28: She has right sternoclavicular tenderness and inflammation on exam today.  She has no other features of autoimmune disease at this time.  No malar rash noted on exam.  She is not experiencing any symptoms of Raynolds and no digital ulcerations or signs of gangrene were noted.  She has mild mouth dryness but no eye dryness.  No parotid swelling or tenderness was noted on exam.  She has not had any palpitations or shortness of breath recently.  She has not had any cervical lymphadenopathy, fevers, or worsening fatigue.  No oral or nasal ulcerations were noted on exam.  Primary osteoarthritis of both hands: She has PIP and DIP synovial thickening consistent with osteoarthritis of bilateral hands.  She has no synovitis on exam.  She has complete fist formation bilaterally.  She experiences intermittent discomfort in bilateral hands.  Joint protection and muscle strengthening were discussed.  Primary osteoarthritis of both knees - Chondromalacia patella: No warmth or effusion.  She has good range of motion.  She has positive tender points on the medial aspect of bilateral knee joints.  Trochanteric bursitis of both hips: She has no tenderness on exam today.  She perform stretching exercises occasionally.  Arthritis of right sternoclavicular joint: She has tenderness and diffuse inflammation on exam.  She has not had any injuries.  She had an US soft tissue head and neck on 05/25/18 that revealed nonspecific findings with differential diagnosis including infection, inflammation, and less likely metastatic disease.  A chest CT with contrast was obtained on 05/29/2018 that revealed fullness of the right sternoclavicular joint with possible erosions or subchondral cysts.  The  findings were consistent with inflammatory versus degenerative arthritis.  Dr. Junius Roads started her on a prednisone taper which  decrease inflammation and discomfort significantly.  He obtained the following lab work: positive ANA 1: 40 NS, RF negative, CCP negative, sed rate 14, and uric acid 3.9.  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  We discussed the indications, contraindications, potential side effects of methotrexate.  We will obtain immunosuppressive lab work today.  All questions were addressed and consent was obtained today.  We will wait to send in a prescription for methotrexate until all lab work is back.  Patient was counseled on the purpose, proper use, and adverse effects of methotrexate including nausea, infection, and signs and symptoms of pneumonitis.  Reviewed instructions with patient to take methotrexate weekly along with folic acid daily.  Discussed the importance of frequent monitoring of kidney and liver function and blood counts, and provided patient with standing lab instructions.  Counseled patient to avoid NSAIDs and alcohol while on methotrexate.  Provided patient with educational materials on methotrexate and answered all questions.  Advised patient to get annual influenza vaccine and to get a pneumococcal vaccine if patient has not already had one.  Patient voiced understanding.  Patient consented to methotrexate use.  Will upload into chart.    High risk medication use - She will be starting on methotrexate.  The following labs were obtained today. Plan: Hepatitis panel, acute, HIV Antibody (routine testing w rflx), QuantiFERON-TB Gold Plus, Serum protein electrophoresis with reflex, IgG, IgA, IgM, Glucose 6 phosphate dehydrogenase, COMPLETE METABOLIC PANEL WITH GFR   DDD (degenerative disc disease), cervical: She has limited range of motion with discomfort.  She has no symptoms of radiculopathy at this time.  DDD (degenerative disc disease), lumbar - With spinal stenosis: Limited range of motion with discomfort.  No midline spinal tenderness.  She had lumbar spine surgery in September  2019.  Fibromyalgia: She has positive tender points and generalized hyperalgesia on exam today.  Other fatigue: Her level of fatigue has been stable recently.  Vitamin D deficiency: She is taking a vitamin D supplement.   Other medical conditions are listed as follows:   History of gastroesophageal reflux (GERD)  History of depression  History of breast cancer - Right-1996Left-2015  Peripheral edema  Essential hypertension   Orders: Orders Placed This Encounter  Procedures  . Hepatitis panel, acute  . HIV Antibody (routine testing w rflx)  . QuantiFERON-TB Gold Plus  . Serum protein electrophoresis with reflex  . IgG, IgA, IgM  . Glucose 6 phosphate dehydrogenase  . COMPLETE METABOLIC PANEL WITH GFR   No orders of the defined types were placed in this encounter.   Face-to-face time spent with patient was 30 minutes. Greater than 50% of time was spent in counseling and coordination of care.  Follow-Up Instructions: Return in about 3 months (around 10/04/2018) for Rheumatoid arthritis.   Ofilia Neas, PA-C  Note - This record has been created using Dragon software.  Chart creation errors have been sought, but may not always  have been located. Such creation errors do not reflect on  the standard of medical care.

## 2018-07-06 ENCOUNTER — Telehealth: Payer: Self-pay | Admitting: Pharmacist

## 2018-07-06 ENCOUNTER — Encounter: Payer: Self-pay | Admitting: Rheumatology

## 2018-07-06 ENCOUNTER — Ambulatory Visit: Payer: Medicare HMO | Admitting: Physician Assistant

## 2018-07-06 VITALS — BP 139/80 | HR 79 | Resp 16 | Ht 67.0 in | Wt 181.8 lb

## 2018-07-06 DIAGNOSIS — M797 Fibromyalgia: Secondary | ICD-10-CM | POA: Diagnosis not present

## 2018-07-06 DIAGNOSIS — M7061 Trochanteric bursitis, right hip: Secondary | ICD-10-CM | POA: Diagnosis not present

## 2018-07-06 DIAGNOSIS — M19041 Primary osteoarthritis, right hand: Secondary | ICD-10-CM

## 2018-07-06 DIAGNOSIS — Z79899 Other long term (current) drug therapy: Secondary | ICD-10-CM | POA: Diagnosis not present

## 2018-07-06 DIAGNOSIS — E559 Vitamin D deficiency, unspecified: Secondary | ICD-10-CM | POA: Diagnosis not present

## 2018-07-06 DIAGNOSIS — I10 Essential (primary) hypertension: Secondary | ICD-10-CM

## 2018-07-06 DIAGNOSIS — M170 Bilateral primary osteoarthritis of knee: Secondary | ICD-10-CM

## 2018-07-06 DIAGNOSIS — M5136 Other intervertebral disc degeneration, lumbar region: Secondary | ICD-10-CM | POA: Diagnosis not present

## 2018-07-06 DIAGNOSIS — R609 Edema, unspecified: Secondary | ICD-10-CM

## 2018-07-06 DIAGNOSIS — R5383 Other fatigue: Secondary | ICD-10-CM | POA: Diagnosis not present

## 2018-07-06 DIAGNOSIS — Z853 Personal history of malignant neoplasm of breast: Secondary | ICD-10-CM

## 2018-07-06 DIAGNOSIS — M19042 Primary osteoarthritis, left hand: Secondary | ICD-10-CM

## 2018-07-06 DIAGNOSIS — M5030 Other cervical disc degeneration, unspecified cervical region: Secondary | ICD-10-CM

## 2018-07-06 DIAGNOSIS — M7062 Trochanteric bursitis, left hip: Secondary | ICD-10-CM

## 2018-07-06 DIAGNOSIS — M19011 Primary osteoarthritis, right shoulder: Secondary | ICD-10-CM

## 2018-07-06 DIAGNOSIS — R768 Other specified abnormal immunological findings in serum: Secondary | ICD-10-CM

## 2018-07-06 DIAGNOSIS — R600 Localized edema: Secondary | ICD-10-CM

## 2018-07-06 DIAGNOSIS — Z8719 Personal history of other diseases of the digestive system: Secondary | ICD-10-CM

## 2018-07-06 DIAGNOSIS — Z8659 Personal history of other mental and behavioral disorders: Secondary | ICD-10-CM

## 2018-07-06 DIAGNOSIS — M51369 Other intervertebral disc degeneration, lumbar region without mention of lumbar back pain or lower extremity pain: Secondary | ICD-10-CM

## 2018-07-06 NOTE — Patient Instructions (Signed)
Standing Labs We placed an order today for your standing lab work.    Please come back and get your standing labs after starting methotrexate in 2 weeks, then 4 weeks, then 8 weeks, then every 3 months  We have open lab Monday through Friday from 8:30-11:30 AM and 1:30-4:00 PM  at the office of Dr. Bo Merino.   You may experience shorter wait times on Monday and Friday afternoons. The office is located at [address] [place], [place], [place] [address] No appointment is necessary.   Labs are drawn by [facility].  You may receive a bill from [facility] for your lab work.  If you wish to have your labs drawn at another location, please call the office 24 hours in advance to send orders.  If you have any questions regarding directions or hours of operation,  please call [phone].   Just as a reminder please drink plenty of water prior to coming for your lab work. Thanks!  Vaccines You are taking a medication(s) that can suppress your immune system.  The following immunizations are recommended: . Flu annually . Pneumonia (Pneumovax 23 and Prevnar 13 spaced at least 1 year apart) . Shingrix  Please check with your PCP to make sure you are up to date.  Methotrexate tablets What is this medicine? METHOTREXATE (METH oh TREX ate) is a chemotherapy drug used to treat cancer including breast cancer, leukemia, and lymphoma. This medicine can also be used to treat psoriasis and certain kinds of arthritis. This medicine may be used for other purposes; ask your health care provider or pharmacist if you have questions. COMMON BRAND NAME(S): Rheumatrex, Trexall What should I tell my health care provider before I take this medicine? They need to know if you have any of these conditions: -fluid in the stomach area or lungs -if you often drink alcohol -infection or immune system problems -kidney disease or on hemodialysis -liver disease -low blood counts, like low white cell, platelet, or  red cell counts -lung disease -radiation therapy -stomach ulcers -ulcerative colitis -an unusual or allergic reaction to methotrexate, other medicines, foods, dyes, or preservatives -pregnant or trying to get pregnant -breast-feeding How should I use this medicine? Take this medicine by mouth with a glass of water. Follow the directions on the prescription label. Take your medicine at regular intervals. Do not take it more often than directed. Do not stop taking except on your doctor's advice. Make sure you know why you are taking this medicine and how often you should take it. If this medicine is used for a condition that is not cancer, like arthritis or psoriasis, it should be taken weekly, NOT daily. Taking this medicine more often than directed can cause serious side effects, even death. Talk to your healthcare provider about safe handling and disposal of this medicine. You may need to take special precautions. Talk to your pediatrician regarding the use of this medicine in children. While this drug may be prescribed for selected conditions, precautions do apply. Overdosage: If you think you have taken too much of this medicine contact a poison control center or emergency room at once. NOTE: This medicine is only for you. Do not share this medicine with others. What if I miss a dose? If you miss a dose, talk with your doctor or health care professional. Do not take double or extra doses. What may interact with this medicine? This medicine may interact with the following medication: -acitretin -aspirin and aspirin-like medicines including salicylates -azathioprine -  certain antibiotics like penicillins, tetracycline, and chloramphenicol -cyclosporine -gold -hydroxychloroquine -live virus vaccines -NSAIDs, medicines for pain and inflammation, like ibuprofen or naproxen -other cytotoxic agents -penicillamine -phenylbutazone -phenytoin -probenecid -retinoids such as isotretinoin and  tretinoin -steroid medicines like prednisone or cortisone -sulfonamides like sulfasalazine and trimethoprim/sulfamethoxazole -theophylline This list may not describe all possible interactions. Give your health care provider a list of all the medicines, herbs, non-prescription drugs, or dietary supplements you use. Also tell them if you smoke, drink alcohol, or use illegal drugs. Some items may interact with your medicine. What should I watch for while using this medicine? Avoid alcoholic drinks. This medicine can make you more sensitive to the sun. Keep out of the sun. If you cannot avoid being in the sun, wear protective clothing and use sunscreen. Do not use sun lamps or tanning beds/booths. You may need blood work done while you are taking this medicine. Call your doctor or health care professional for advice if you get a fever, chills or sore throat, or other symptoms of a cold or flu. Do not treat yourself. This drug decreases your body's ability to fight infections. Try to avoid being around people who are sick. This medicine may increase your risk to bruise or bleed. Call your doctor or health care professional if you notice any unusual bleeding. Check with your doctor or health care professional if you get an attack of severe diarrhea, nausea and vomiting, or if you sweat a lot. The loss of too much body fluid can make it dangerous for you to take this medicine. Talk to your doctor about your risk of cancer. You may be more at risk for certain types of cancers if you take this medicine. Both men and women must use effective birth control with this medicine. Do not become pregnant while taking this medicine or until at least 1 normal menstrual cycle has occurred after stopping it. Women should inform their doctor if they wish to become pregnant or think they might be pregnant. Men should not father a child while taking this medicine and for 3 months after stopping it. There is a potential for  serious side effects to an unborn child. Talk to your health care professional or pharmacist for more information. Do not breast-feed an infant while taking this medicine. What side effects may I notice from receiving this medicine? Side effects that you should report to your doctor or health care professional as soon as possible: -allergic reactions like skin rash, itching or hives, swelling of the face, lips, or tongue -breathing problems or shortness of breath -diarrhea -dry, nonproductive cough -low blood counts - this medicine may decrease the number of white blood cells, red blood cells and platelets. You may be at increased risk for infections and bleeding. -mouth sores -redness, blistering, peeling or loosening of the skin, including inside the mouth -signs of infection - fever or chills, cough, sore throat, pain or trouble passing urine -signs and symptoms of bleeding such as bloody or black, tarry stools; red or dark-brown urine; spitting up blood or brown material that looks like coffee grounds; red spots on the skin; unusual bruising or bleeding from the eye, gums, or nose -signs and symptoms of kidney injury like trouble passing urine or change in the amount of urine -signs and symptoms of liver injury like dark yellow or brown urine; general ill feeling or flu-like symptoms; light-colored stools; loss of appetite; nausea; right upper belly pain; unusually weak or tired; yellowing of the eyes or  skin Side effects that usually do not require medical attention (report to your doctor or health care professional if they continue or are bothersome): -dizziness -hair loss -tiredness -upset stomach -vomiting This list may not describe all possible side effects. Call your doctor for medical advice about side effects. You may report side effects to FDA at 1-800-FDA-1088. Where should I keep my medicine? Keep out of the reach of children. Store at room temperature between 20 and 25 degrees  C (68 and 77 degrees F). Protect from light. Throw away any unused medicine after the expiration date. NOTE: This sheet is a summary. It may not cover all possible information. If you have questions about this medicine, talk to your doctor, pharmacist, or health care provider.  2019 Elsevier/Gold Standard (2017-01-02 13:38:43)

## 2018-07-06 NOTE — Progress Notes (Signed)
Pharmacy Note  Subjective: Patient presents today to the [facility] to see Dr. Estanislado Pandy.  Patient seen by the pharmacist for counseling on methotrexate for rheumatoid arthritis. She is naive to therapy.  She has a history of melanoma and breast cancer for which she is currently taking Tamoxifen.  Objective: CBC    Component Value Date/Time   WBC 5.2 06/24/2018 0844   RBC 4.30 06/24/2018 0844   HGB 12.8 06/24/2018 0844   HGB 14.3 08/14/2016 1200   HCT 38.5 06/24/2018 0844   HCT 41.9 08/14/2016 1200   PLT 169 06/24/2018 0844   PLT 188 08/14/2016 1200   MCV 89.5 06/24/2018 0844   MCV 91.0 08/14/2016 1200   MCH 29.8 06/24/2018 0844   MCHC 33.2 06/24/2018 0844   RDW 13.0 06/24/2018 0844   RDW 13.3 08/14/2016 1200   LYMPHSABS 1,414 06/24/2018 0844   LYMPHSABS 1.4 08/14/2016 1200   MONOABS 0.4 04/07/2018 0852   MONOABS 0.5 08/14/2016 1200   EOSABS 218 06/24/2018 0844   EOSABS 0.3 08/14/2016 1200   BASOSABS 42 06/24/2018 0844   BASOSABS 0.0 08/14/2016 1200    CMP     Component Value Date/Time   NA 140 04/07/2018 0852   NA 143 08/14/2016 1200   K 3.6 04/07/2018 0852   K 4.2 08/14/2016 1200   CL 103 04/07/2018 0852   CO2 29 04/07/2018 0852   CO2 27 08/14/2016 1200   GLUCOSE 103 (H) 04/07/2018 0852   GLUCOSE 67 (L) 08/14/2016 1200   BUN 11 04/07/2018 0852   BUN 13.3 08/14/2016 1200   CREATININE 0.82 04/07/2018 0852   CREATININE 0.9 08/14/2016 1200   CALCIUM 8.9 04/07/2018 0852   CALCIUM 9.7 08/14/2016 1200   PROT 6.9 04/07/2018 0852   PROT 6.6 08/14/2016 1200   ALBUMIN 4.0 04/07/2018 0852   ALBUMIN 3.8 08/14/2016 1200   AST 20 04/07/2018 0852   AST 24 08/14/2016 1200   ALT 15 04/07/2018 0852   ALT 23 08/14/2016 1200   ALKPHOS 63 04/07/2018 0852   ALKPHOS 71 08/14/2016 1200   BILITOT 0.5 04/07/2018 0852   BILITOT 0.56 08/14/2016 1200   GFRNONAA 54 (L) 06/07/2015 0905   GFRAA >60 06/07/2015 0905    Baseline Immunosuppressant Therapy Labs TB  gold: pending 07/06/2018 Hepatitis panel:pending 07/06/2018 CZY:SAYTKZS 07/06/2018 SPEP:pending 07/06/2018 Immunoglobulins:pending 07/06/2018 G6PD:pending 07/06/2018  Chest-xray:  No active disease 09/23/2016  Contraception: post-menopausal  Alcohol use: N/A  Immunization History  Administered Date(s) Administered  . Influenza Split 02/06/2011, 02/10/2012  . Influenza Whole 04/14/2007  . Influenza, High Dose Seasonal PF 04/06/2015, 03/18/2017, 01/29/2018  . Influenza,inj,Quad PF,6+ Mos 07/29/2014, 02/20/2016  . Pneumococcal Conjugate-13 02/20/2016  . Pneumococcal Polysaccharide-23 07/29/2014  . Tdap 01/03/2013  . Tetanus 01/04/2008  . Zoster Recombinat (Shingrix) 02/14/2018, 06/02/2018   Assessment/Plan:   Patient was counseled on the purpose, proper use, and adverse effects of methotrexate including nausea, infection, and signs and symptoms of pneumonitis. Discussed that there is the possibility of an increased risk of malignancy, specifically lymphomas, but it is not well understood if this increased risk is due to the medication or the disease state.  Instructed patient that medication should be held for infection and prior to surgery.  Advised patient to avoid live vaccines. Recommend annual influenza, Pneumovax 23, Prevnar 13, and Shingrix as indicated. She is up to date with vaccines at this time.  Reviewed instructions with patient to take methotrexate weekly along with folic acid daily.  Discussed the importance of frequent monitoring  of kidney and liver function and blood counts, and provided patient with standing lab instructions.  Counseled patient to avoid NSAIDs and alcohol while on methotrexate.  Provided patient with educational materials on methotrexate and answered all questions.   Patient voiced understanding. Patient consented to methotrexate use.  Will upload into chart.    Dose of methotrexate will be 6 tablets weekly for 2 weeks then increase to 8 tablets weekly if  tolerated along with folic acid 2 mg daily. Prescription pending lab results.  Patient concerned about drug interaction between tamoxifen and methotrexate.  Reviewed interaction and drug database and it does list as a major interaction and increased risk of thromboembolism but only when being used as chemotherapy adjunct.  Informed patient that dosing for treatment of autoimmune diseases is much less than chemotherapy doses.  Patient verbalized understanding.  All questions encouraged and answered.  Instructed patient to call with any further questions or concerns.  Mariella Saa, PharmD, [facility] Clinical Pharmacist  07/06/2018 10:04 AM

## 2018-07-06 NOTE — Telephone Encounter (Signed)
Dose of methotrexate will be 6 tablets weekly for 2 weeks then increase to 8 tablets weekly if tolerated along with folic acid 2 mg daily. Prescription pending lab results.

## 2018-07-08 LAB — HEPATITIS PANEL, ACUTE
HEP A IGM: NONREACTIVE
HEP C AB: NONREACTIVE
Hep B C IgM: NONREACTIVE
Hepatitis B Surface Ag: NONREACTIVE
SIGNAL TO CUT-OFF: 0.01 (ref <1.00)

## 2018-07-08 LAB — COMPLETE METABOLIC PANEL WITH GFR
AG Ratio: 1.5 (calc) (ref 1.0–2.5)
ALT: 15 U/L (ref 6–29)
AST: 19 U/L (ref 10–35)
Albumin: 3.9 g/dL (ref 3.6–5.1)
Alkaline phosphatase (APISO): 59 U/L (ref 37–153)
BUN: 15 mg/dL (ref 7–25)
CALCIUM: 9.2 mg/dL (ref 8.6–10.4)
CO2: 30 mmol/L (ref 20–32)
Chloride: 106 mmol/L (ref 98–110)
Creat: 0.81 mg/dL (ref 0.60–0.93)
GFR, EST NON AFRICAN AMERICAN: 73 mL/min/{1.73_m2} (ref >=60)
GFR, Est African American: 84 mL/min/{1.73_m2} (ref >=60)
GLUCOSE: 89 mg/dL (ref 65–99)
Globulin: 2.6 g/dL (calc) (ref 1.9–3.7)
Potassium: 4.1 mmol/L (ref 3.5–5.3)
Sodium: 143 mmol/L (ref 135–146)
Total Bilirubin: 0.4 mg/dL (ref 0.2–1.2)
Total Protein: 6.5 g/dL (ref 6.1–8.1)

## 2018-07-08 LAB — IGG, IGA, IGM
IgG (Immunoglobin G), Serum: 987 mg/dL (ref 600–1540)
IgM, Serum: 78 mg/dL (ref 50–300)
Immunoglobulin A: 243 mg/dL (ref 70–320)

## 2018-07-08 LAB — GLUCOSE 6 PHOSPHATE DEHYDROGENASE: G-6PDH: 17.8 U/g Hgb (ref 7.0–20.5)

## 2018-07-08 LAB — PROTEIN ELECTROPHORESIS, SERUM, WITH REFLEX
Albumin ELP: 3.9 g/dL (ref 3.8–4.8)
Alpha 1: 0.3 g/dL (ref 0.2–0.3)
Alpha 2: 0.8 g/dL (ref 0.5–0.9)
Beta 2: 0.3 g/dL (ref 0.2–0.5)
Beta Globulin: 0.4 g/dL (ref 0.4–0.6)
Gamma Globulin: 0.9 g/dL (ref 0.8–1.7)
Total Protein: 6.5 g/dL (ref 6.1–8.1)

## 2018-07-08 LAB — QUANTIFERON-TB GOLD PLUS
Mitogen-NIL: 6.72 IU/mL
NIL: 0.04 IU/mL
QuantiFERON-TB Gold Plus: NEGATIVE
TB1-NIL: 0.02 IU/mL
TB2-NIL: 0.05 IU/mL

## 2018-07-08 LAB — HIV ANTIBODY (ROUTINE TESTING W REFLEX): HIV 1&2 Ab, 4th Generation: NONREACTIVE

## 2018-07-08 MED ORDER — FOLIC ACID 1 MG PO TABS: 2.0000 mg | ORAL_TABLET | Freq: Every day | ORAL | 3 refills | Status: DC

## 2018-07-08 MED ORDER — METHOTREXATE 2.5 MG PO TABS: ORAL_TABLET | ORAL | 0 refills | Status: DC

## 2018-07-08 NOTE — Progress Notes (Signed)
Hepatitis panel negative. HIV negative. Immunoglobulins WNL. G6PD WNL. TB gold negative. CMP WNL. SPEP normal.

## 2018-07-08 NOTE — Telephone Encounter (Signed)
Patient advised of lab results and prescription sent to the pharmacy.  

## 2018-07-09 DIAGNOSIS — N644 Mastodynia: Secondary | ICD-10-CM | POA: Diagnosis not present

## 2018-07-09 DIAGNOSIS — B373 Candidiasis of vulva and vagina: Secondary | ICD-10-CM | POA: Diagnosis not present

## 2018-07-14 ENCOUNTER — Telehealth: Payer: Self-pay | Admitting: Pharmacist

## 2018-07-14 NOTE — Telephone Encounter (Signed)
Patient called inquiring about taking ibuprofen with methotrexate.  She took her first dose of methotrexate yesterday and states she has not had any side effects.  Informed patient that NSAIDs can also cause liver damage so use with methotrexate is not recommended.  She asked if she could take Tylenol and informed the patient that Tylenol also can cause liver damage and is not recommended.  Encourage patient to limit use of NSAIDs and Tylenol as much as possible.  Patient verbalized understanding.  She also inquired about the use of Plaquenil.  Her friend told her that he is taking methotrexate and Plaquenil for inflammation.  She wanted to know that would be appropriate for her.  Informed patient that Plaquenil is not an anti-inflammatory but a medication we use for autoimmune diseases which can help with inflammation.  Explained that Plaquenil is a first-line medication for autoimmune diseases and is not always effective enough at controlling the disease process.  We felt that methotrexate was appropriate for her at this time based off of her disease severity.  Patient verbalized understanding and appreciated the explanation.  She wanted to know when she needs to come back for labs.  Informed patient to come next Monday and we and will update her with the results and whether or not we need to adjust her methotrexate dose.  Patient verbalized understanding.  All questions encouraged and answered.  Instructed patient to call with any further questions or concerns.  Mariella Saa, PharmD, [facility] Clinical Pharmacist  07/14/2018 8:42 AM

## 2018-07-27 ENCOUNTER — Other Ambulatory Visit: Payer: Self-pay | Admitting: *Deleted

## 2018-07-27 DIAGNOSIS — Z79899 Other long term (current) drug therapy: Secondary | ICD-10-CM

## 2018-07-28 LAB — COMPLETE METABOLIC PANEL WITH GFR
AG Ratio: 1.7 (calc) (ref 1.0–2.5)
ALT: 11 U/L (ref 6–29)
AST: 16 U/L (ref 10–35)
Albumin: 4 g/dL (ref 3.6–5.1)
Alkaline phosphatase (APISO): 57 U/L (ref 37–153)
BUN: 12 mg/dL (ref 7–25)
CO2: 31 mmol/L (ref 20–32)
Calcium: 9.1 mg/dL (ref 8.6–10.4)
Chloride: 103 mmol/L (ref 98–110)
Creat: 0.89 mg/dL (ref 0.60–0.93)
GFR, EST AFRICAN AMERICAN: 75 mL/min/{1.73_m2} (ref >=60)
GFR, Est Non African American: 65 mL/min/{1.73_m2} (ref >=60)
Globulin: 2.4 g/dL (calc) (ref 1.9–3.7)
Glucose, Bld: 115 mg/dL — ABNORMAL HIGH (ref 65–99)
Potassium: 3.8 mmol/L (ref 3.5–5.3)
Sodium: 142 mmol/L (ref 135–146)
Total Bilirubin: 0.4 mg/dL (ref 0.2–1.2)
Total Protein: 6.4 g/dL (ref 6.1–8.1)

## 2018-07-28 LAB — CBC WITH DIFFERENTIAL/PLATELET
Absolute Monocytes: 483 cells/uL (ref 200–950)
Basophils Absolute: 71 cells/uL (ref 0–200)
Basophils Relative: 1 %
EOS ABS: 220 {cells}/uL (ref 15–500)
Eosinophils Relative: 3.1 %
HCT: 40.7 % (ref 35.0–45.0)
HEMOGLOBIN: 13.4 g/dL (ref 11.7–15.5)
Lymphs Abs: 1761 cells/uL (ref 850–3900)
MCH: 29.7 pg (ref 27.0–33.0)
MCHC: 32.9 g/dL (ref 32.0–36.0)
MCV: 90.2 fL (ref 80.0–100.0)
MPV: 11.8 fL (ref 7.5–12.5)
Monocytes Relative: 6.8 %
Neutro Abs: 4565 cells/uL (ref 1500–7800)
Neutrophils Relative %: 64.3 %
Platelets: 189 10*3/uL (ref 140–400)
RBC: 4.51 10*6/uL (ref 3.80–5.10)
RDW: 13.6 % (ref 11.0–15.0)
Total Lymphocyte: 24.8 %
WBC: 7.1 10*3/uL (ref 3.8–10.8)

## 2018-07-28 NOTE — Progress Notes (Signed)
stable °

## 2018-08-03 ENCOUNTER — Other Ambulatory Visit: Payer: Self-pay | Admitting: Rheumatology

## 2018-08-03 NOTE — Telephone Encounter (Signed)
Last Visit: 07/06/18 Next Visit: 02/04/19  Labs: 07/27/18 stable  Okay to refill per Dr. Estanislado Pandy

## 2018-09-04 ENCOUNTER — Other Ambulatory Visit: Payer: Self-pay | Admitting: Physician Assistant

## 2018-10-05 ENCOUNTER — Telehealth: Payer: Self-pay | Admitting: *Deleted

## 2018-10-05 NOTE — Telephone Encounter (Signed)
Patient called into the office stating she has been on the MTX since February. Patient states she does not feel like it is working. Patient states she is in worse pain and that she is feeling tired with taking the MTX. Patient has been scheduled for an appointment to discuss treatment options on 10/06/18 at 10:45 am.

## 2018-10-05 NOTE — Progress Notes (Signed)
Office Visit Note  Patient: [name]             Date of Birth: 1946-02-07           MRN: [id number]             PCP: Delorse Limber Referring: Brunetta Jeans, PA-C Visit Date: 10/06/2018 Occupation: @GUAROCC @  Subjective:  Polyarthralgia   History of Present Illness: [name] is a 73 y.o. female with history of osteoarthritis, fibromyalgia, and DDD.  She could not tolerate taking MTX and discontinued 1 week ago. She took it for 3 months. She states she was experiencing headaches, dizziness, myalgias, and polyarthralgia, which she attributes to a side effect of MTX.  She has not noticed any benefit since starting MTX.  She has not noticed any improvement in the right sternoclavicular joint pain and swelling.  She is having increased trapezius muscle tension and spasms.  She has been having myalgias along her ribcage. She has been off of Aleve since starting on MTX, but she would like to restart on Aleve.  Activities of Daily Living:  Patient reports morning stiffness for 30 minutes.   Patient Reports nocturnal pain.  Difficulty dressing/grooming: Denies Difficulty climbing stairs: Denies Difficulty getting out of chair: Reports Difficulty using hands for taps, buttons, cutlery, and/or writing: Reports  Review of Systems  Constitutional: Positive for fatigue.  HENT: Positive for mouth dryness. Negative for mouth sores and nose dryness.   Eyes: Negative for pain, itching and dryness.  Respiratory: Negative for shortness of breath, wheezing and difficulty breathing.   Cardiovascular: Negative for palpitations and swelling in legs/feet.  Gastrointestinal: Negative for abdominal pain, constipation and diarrhea.  Endocrine: Negative for increased urination.  Genitourinary: Negative for painful urination and pelvic pain.  Musculoskeletal: Positive for arthralgias, joint pain, joint swelling and morning stiffness.  Skin: Negative for rash and redness.   Allergic/Immunologic: Negative for susceptible to infections.  Neurological: Positive for dizziness, headaches, memory loss and weakness. Negative for light-headedness.  Hematological: Positive for bruising/bleeding tendency.  Psychiatric/Behavioral: Positive for confusion. The patient is not nervous/anxious.     PMFS History:  Patient Active Problem List   Diagnosis Date Noted  . Episode of recurrent major depressive disorder ([place]) 12/05/2017  . Primary osteoarthritis of both hands 07/09/2017  . Primary osteoarthritis of both knees 07/09/2017  . DDD (degenerative disc disease), cervical 07/09/2017  . DDD (degenerative disc disease), lumbar 07/09/2017  . Peripheral edema 11/26/2016  . Medicare annual wellness visit, subsequent 06/30/2016  . Hx of adenomatous polyp of colon 07/03/2015  . 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 Family history of breast cancer   . Family history of colon cancer   . Family history of pancreatic cancer   . Breast cancer, right breast ([place]) 05/18/2014  . Malignant neoplasm of upper-outer quadrant of left breast in female, estrogen receptor positive ([place]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  . Peripheral neuropathy 01/01/2013  . Melanoma of upper arm ([place]) 02/10/2012  . S/P laparoscopic fundoplication 11/24/1599  . FATTY LIVER DISEASE 12/07/2009  . COAGULOPATHY 07/12/2008  . CHEST XRAY, ABNORMAL 01/04/2008  . Vitamin D deficiency 04/14/2007  . VITAMIN B12 DEFICIENCY 02/27/2007  . Essential hypertension 02/27/2007  . GERD 02/27/2007  . Fibromyalgia 02/27/2007    Past Medical History:  Diagnosis Date  . Arthritis    "spine" (07/28/2014)  . Blood dyscrasia    bruises and bleed easily  . Breast cancer, left breast ([place]) 07/28/2014  . Breast cancer,  right breast ([place]) 1995  . Chronic back pain    "all over"  . Complication of anesthesia    went to sleep easily but hard to wake up up until elbow OR in 2010  . Depression   . Dyspnea    Normal Spirometry 03/2008 EF  65% BNP normal 11/2007  . Family history of breast cancer   . Family history of colon cancer   . Family history of pancreatic cancer   . Gastric polyps   . GERD (gastroesophageal reflux disease)   . History of chicken pox   . History of hiatal hernia   . Hx of adenomatous polyp of colon 07/03/2015  . Kidney stone    right kidney   . Lichen sclerosus   . Melanoma ([place]) 2010   "right elbow; treated at [facility]"  . Mild anxiety   . Vitamin B12 deficiency   . Vitamin D deficiency     Family History  Problem Relation Age of Onset  . Colon cancer Cousin 58       double first cousin  . Colon cancer Cousin 8       double first cousin  . Colon polyps Father        between 57-20  . Dementia Father   . CAD Father        CABG at age 27  . Stroke Mother   . Pancreatic cancer Brother 19  . Colon polyps Brother        between 10-20  . Cancer Paternal Uncle        NOS  . Anemia Paternal Grandfather        pernicious anemia  . Breast cancer Cousin 54       double first cousin  . Breast cancer Cousin 74       paternal cousin  . Stroke Other        F 1st degree relative 28, M 1st degree relative  . Esophageal cancer Neg Hx   . Stomach cancer Neg Hx    Past Surgical History:  Procedure Laterality Date  . BREAST BIOPSY Left 04/2014  . BREAST RECONSTRUCTION WITH PLACEMENT OF TISSUE EXPANDER AND FLEX HD (ACELLULAR HYDRATED DERMIS) Left 07/28/2014   Procedure: LEFT BREAST RECONSTRUCTION PLACEMENT OF LEFT TISSUE EXPANDER ;  Surgeon: Crissie Reese, MD;  Location: [place];  Service: Plastics;  Laterality: Left;  . BREAST RECONSTRUCTION WITH PLACEMENT OF TISSUE EXPANDER AND FLEX HD (ACELLULAR HYDRATED DERMIS) Left 09/08/2014   Procedure: REMOVAL OF TISSUE EXPANDER FROM LEFT BREAST;  Surgeon: Crissie Reese, MD;  Location: [place];  Service: Plastics;  Laterality: Left;  . BUNIONECTOMY Bilateral 1970's  . COLONOSCOPY     Dr Sharlett Iles  . CYSTOSCOPY WITH RETROGRADE PYELOGRAM, URETEROSCOPY AND STENT  PLACEMENT Right 06/09/2015   Procedure: CYSTOSCOPY WITH RIGHT RETROGRADE PYELOGRAM, RIGHT URETEROSCOPY AND RIGHT URTERAL STENT PLACEMENT;  Surgeon: Ardis Hughs, MD;  Location: [facility];  Service: Urology;  Laterality: Right;  . ESOPHAGOGASTRODUODENOSCOPY    . HERNIA REPAIR    . HOLMIUM LASER APPLICATION Right 0/26/3785   Procedure: HOLMIUM LASER APPLICATION;  Surgeon: Ardis Hughs, MD;  Location: [facility];  Service: Urology;  Laterality: Right;  . LATISSIMUS FLAP TO BREAST Left 09/08/2014   Procedure: LEFT LATISSIMUS FLAP TO BREAST WITH SALINE IMPLANT FOR BREAST RECONSTRUCTION;  Surgeon: Crissie Reese, MD;  Location: [place];  Service: Plastics;  Laterality: Left;  . LUMBAR FUSION  01/2018   L5-S1 transitional segmental laminectomy and Fusion  .  MASTECTOMY Right 1996    chemotherapy. pt. states 13 lymph nodes were removed  . MASTECTOMY COMPLETE / SIMPLE W/ SENTINEL NODE BIOPSY Left 07/28/2014  . MASTECTOMY W/ SENTINEL NODE BIOPSY Left 07/28/2014   Procedure: LEFT MASTECTOMY WITH SENTINEL LYMPH NODE MAPPING;  Surgeon: Autumn Messing III, MD;  Location: [place];  Service: General;  Laterality: Left;  Marland Kitchen MELANOMA EXCISION Right 2010   From elbow-- Done at [facility]   . NISSEN FUNDOPLICATION  79/8921  . RECONSTRUCTION BREAST IMMEDIATE / DELAYED W/ TISSUE EXPANDER Left 07/28/2014  . TEMPOROMANDIBULAR JOINT SURGERY Bilateral 1987  . TONSILLECTOMY     Social History   Social History Narrative   She lives with husband.     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 [employer] History  Administered Date(s) Administered  . Influenza Split 02/06/2011, 02/10/2012  . Influenza Whole 04/14/2007  . Influenza, High Dose Seasonal PF 04/06/2015, 03/18/2017, 01/29/2018  . Influenza,inj,Quad PF,6+ Mos 07/29/2014, 02/20/2016  . Pneumococcal Conjugate-13 02/20/2016  . Pneumococcal Polysaccharide-23 07/29/2014  . Tdap 01/03/2013  . Tetanus 01/04/2008  . Zoster  Recombinat (Shingrix) 02/14/2018, 06/02/2018     Objective: Vital Signs: BP (!) 142/82 (BP Location: Left Arm, Patient Position: Sitting, Cuff Size: Normal)   Pulse 69   Resp 13   Ht 5\' 7"  (1.702 m)   Wt 179 lb (81.2 kg)   BMI 28.04 kg/m    Physical Exam Vitals signs and nursing note reviewed.  Constitutional:      Appearance: She is well-developed.  HENT:     Head: Normocephalic and atraumatic.  Eyes:     Conjunctiva/sclera: Conjunctivae normal.  Neck:     Musculoskeletal: Normal range of motion.  Cardiovascular:     Rate and Rhythm: Normal rate and regular rhythm.     Heart sounds: Normal heart sounds.  Pulmonary:     Effort: Pulmonary effort is normal.     Breath sounds: Normal breath sounds.  Abdominal:     General: Bowel sounds are normal.     Palpations: Abdomen is soft.  Lymphadenopathy:     Cervical: No cervical adenopathy.  Skin:    General: Skin is warm and dry.     Capillary Refill: Capillary refill takes less than 2 seconds.  Neurological:     Mental Status: She is alert and oriented to person, place, and time.  Psychiatric:        Behavior: Behavior normal.      Musculoskeletal Exam: C-spine, thoracic, and lumbar spine good ROM with discomfort.  Discomfort with bilateral shoulder ROM.  Elbow joints, wrist joints, MCPs, PIPs, and DIPs good ROM with no synovitis.  Mild PIP and DIP synovial thickening.  Right sternoclavicular joint synovial thickening. Hip joints, knee joints, ankle joints, MTPs, PIPs, and DIPs good ROM with no synovitis.  Positive tender points and generalized hyperalgesia.    CDAI Exam: CDAI Score: Not documented Patient Global Assessment: Not documented; Provider Global Assessment: Not documented Swollen: Not documented; Tender: Not documented Joint Exam   Not documented   There is currently no information documented on the homunculus. Go to the Rheumatology activity and complete the homunculus joint exam.  Investigation: No  additional findings.  Imaging: No results found.  Recent Labs: Lab Results  Component Value Date   WBC 7.1 07/27/2018   HGB 13.4 07/27/2018   PLT 189 07/27/2018   NA 142 07/27/2018   K 3.8 07/27/2018   CL  103 07/27/2018   CO2 31 07/27/2018   GLUCOSE 115 (H) 07/27/2018   BUN 12 07/27/2018   CREATININE 0.89 07/27/2018   BILITOT 0.4 07/27/2018   ALKPHOS 63 04/07/2018   AST 16 07/27/2018   ALT 11 07/27/2018   PROT 6.4 07/27/2018   ALBUMIN 4.0 04/07/2018   CALCIUM 9.1 07/27/2018   GFRAA 75 07/27/2018   QFTBGOLDPLUS NEGATIVE 07/06/2018    Speciality Comments: No specialty comments available.  Procedures:  No procedures performed Allergies: Other; Silodosin; Ace inhibitors; Buprenorphine hcl; Morphine and related; Codeine; Sulfonamide derivatives; and Telmisartan   Assessment / Plan:     Visit Diagnoses: Primary osteoarthritis of both hands: She has mild PIP and DIP synovial thickening consistent with osteoarthritis of bilateral hands.  She has complete fist formation bilaterally.  She has no tenderness or synovitis at this time.  Joint protection and muscle strengthening were discussed.  She was advised to notify us if she develops increased joint pain or joint swelling.   Primary osteoarthritis of both knees: She has chronic pain in bilateral knee joints.  She has no warmth or effusion on exam.  She has good range of motion.  We discussed natural anti-inflammatories that she can introduce.  She was given a handout of this list.  All questions were addressed.  Trochanteric bursitis of both hips: She has tenderness over bilateral trochanter bursa.  She was encouraged to perform stretching exercises on a daily basis.  Fibromyalgia: She has generalized hyperalgesia and positive tender points on exam.  She is been having increased myalgias and arthralgias recently.  She reports increased myalgias after starting on methotrexate.  She took methotrexate for 3 months and discontinued last  week due to not tolerating it.  She was encouraged to stay active and exercise on a regular basis.  Good sleep hygiene was discussed.  Other fatigue: She has chronic fatigue related to insomnia.  DDD (degenerative disc disease), cervical: She is been experiencing increased neck pain and stiffness.  She has no symptoms of radiculopathy at this time.  She has trapezius muscle tension and muscle tenderness bilaterally.  DDD (degenerative disc disease), lumbar: Paraspinal muscle tenderness.  She has no midline spinal tenderness or symptoms of radiculopathy at this time.  Arthritis of right sternoclavicular joint: She has synovial thickening of the right sternoclavicular joint.  She has no tenderness or synovitis.  She did not notice much improvement taking MTX for 3 months.  She discontinued MTX 1 week ago. She does not need to restart at this time. She will start taking Aleve as needed for pain relief.  We also discussed natural anti-inflammatories that she can introduce.  She was advised to notify us if she develops increased tenderness or swelling.   Positive ANA (antinuclear antibody): She has no clinical features of autoimmune disease.   Vitamin D deficiency: She is taking vitamin D supplement daily.  Other medical conditions are listed as follows:  History of gastroesophageal reflux (GERD)  History of depression  History of breast cancer  Essential hypertension  Malignant melanoma of upper arm, unspecified laterality ([place])   Orders: No orders of the defined types were placed in this encounter.  No orders of the defined types were placed in this encounter.   Face-to-face time spent with patient was 30 minutes. Greater than 50% of time was spent in counseling and coordination of care.  Follow-Up Instructions: Return in about 6 months (around 04/08/2019) for Osteoarthritis, Fibromyalgia.   Ofilia Neas, PA-C   I examined  and evaluated the patient with Hazel Sams PA.  Patient  decided to come off methotrexate as she believes that it was causing multiple side effects and increased joint pain.  She continues to have some generalized pain and discomfort due to fibromyalgia.  I do not see any synovitis on examination.  She continues to have some thickening over right sternoclavicular joint but no synovitis was noted.  The plan of care was discussed as noted above.  Bo Merino, MD Note - This record has been created using Editor, commissioning.  Chart creation errors have been sought, but may not always  have been located. Such creation errors do not reflect on  the standard of medical care.

## 2018-10-06 ENCOUNTER — Other Ambulatory Visit: Payer: Self-pay

## 2018-10-06 ENCOUNTER — Ambulatory Visit: Payer: Medicare HMO | Admitting: Rheumatology

## 2018-10-06 ENCOUNTER — Encounter: Payer: Self-pay | Admitting: Rheumatology

## 2018-10-06 VITALS — BP 142/82 | HR 69 | Resp 13 | Ht 67.0 in | Wt 179.0 lb

## 2018-10-06 DIAGNOSIS — E559 Vitamin D deficiency, unspecified: Secondary | ICD-10-CM

## 2018-10-06 DIAGNOSIS — M19041 Primary osteoarthritis, right hand: Secondary | ICD-10-CM

## 2018-10-06 DIAGNOSIS — R5383 Other fatigue: Secondary | ICD-10-CM | POA: Diagnosis not present

## 2018-10-06 DIAGNOSIS — M5136 Other intervertebral disc degeneration, lumbar region: Secondary | ICD-10-CM

## 2018-10-06 DIAGNOSIS — M797 Fibromyalgia: Secondary | ICD-10-CM

## 2018-10-06 DIAGNOSIS — I10 Essential (primary) hypertension: Secondary | ICD-10-CM

## 2018-10-06 DIAGNOSIS — M170 Bilateral primary osteoarthritis of knee: Secondary | ICD-10-CM

## 2018-10-06 DIAGNOSIS — M7062 Trochanteric bursitis, left hip: Secondary | ICD-10-CM

## 2018-10-06 DIAGNOSIS — R768 Other specified abnormal immunological findings in serum: Secondary | ICD-10-CM | POA: Diagnosis not present

## 2018-10-06 DIAGNOSIS — Z8719 Personal history of other diseases of the digestive system: Secondary | ICD-10-CM

## 2018-10-06 DIAGNOSIS — M19042 Primary osteoarthritis, left hand: Secondary | ICD-10-CM

## 2018-10-06 DIAGNOSIS — M19011 Primary osteoarthritis, right shoulder: Secondary | ICD-10-CM

## 2018-10-06 DIAGNOSIS — C4360 Malignant melanoma of unspecified upper limb, including shoulder: Secondary | ICD-10-CM

## 2018-10-06 DIAGNOSIS — M5030 Other cervical disc degeneration, unspecified cervical region: Secondary | ICD-10-CM

## 2018-10-06 DIAGNOSIS — Z853 Personal history of malignant neoplasm of breast: Secondary | ICD-10-CM

## 2018-10-06 DIAGNOSIS — M7061 Trochanteric bursitis, right hip: Secondary | ICD-10-CM

## 2018-10-06 DIAGNOSIS — M51369 Other intervertebral disc degeneration, lumbar region without mention of lumbar back pain or lower extremity pain: Secondary | ICD-10-CM

## 2018-10-06 DIAGNOSIS — Z8659 Personal history of other mental and behavioral disorders: Secondary | ICD-10-CM

## 2018-10-08 ENCOUNTER — Telehealth: Payer: Self-pay | Admitting: Rheumatology

## 2018-10-08 NOTE — Telephone Encounter (Signed)
Patient left a voicemail stating since she is no longer taking Methotrexate does she  she still need to take the Folic Acid.  Patient requested a return call.

## 2018-10-08 NOTE — Telephone Encounter (Signed)
Patient advised that since she is no longer on the MTX she does not need to take the Folic Acid. Patient verbalized understanding.

## 2018-10-19 ENCOUNTER — Other Ambulatory Visit: Payer: Self-pay | Admitting: Physician Assistant

## 2018-11-02 ENCOUNTER — Other Ambulatory Visit: Payer: Self-pay

## 2018-11-02 ENCOUNTER — Inpatient Hospital Stay: Payer: Medicare HMO

## 2018-11-02 ENCOUNTER — Inpatient Hospital Stay: Payer: Medicare HMO | Attending: Oncology | Admitting: Oncology

## 2018-11-02 VITALS — BP 129/59 | HR 73 | Temp 99.5°F | Resp 18 | Ht 67.0 in | Wt 177.5 lb

## 2018-11-02 DIAGNOSIS — Z803 Family history of malignant neoplasm of breast: Secondary | ICD-10-CM | POA: Diagnosis not present

## 2018-11-02 DIAGNOSIS — Z853 Personal history of malignant neoplasm of breast: Secondary | ICD-10-CM

## 2018-11-02 DIAGNOSIS — Z85820 Personal history of malignant melanoma of skin: Secondary | ICD-10-CM | POA: Diagnosis not present

## 2018-11-02 DIAGNOSIS — Z9013 Acquired absence of bilateral breasts and nipples: Secondary | ICD-10-CM | POA: Diagnosis not present

## 2018-11-02 DIAGNOSIS — M8580 Other specified disorders of bone density and structure, unspecified site: Secondary | ICD-10-CM | POA: Diagnosis not present

## 2018-11-02 DIAGNOSIS — Z170 Estrogen receptor positive status [ER+]: Secondary | ICD-10-CM | POA: Diagnosis not present

## 2018-11-02 DIAGNOSIS — M549 Dorsalgia, unspecified: Secondary | ICD-10-CM

## 2018-11-02 DIAGNOSIS — Z79899 Other long term (current) drug therapy: Secondary | ICD-10-CM

## 2018-11-02 DIAGNOSIS — M069 Rheumatoid arthritis, unspecified: Secondary | ICD-10-CM

## 2018-11-02 DIAGNOSIS — G8929 Other chronic pain: Secondary | ICD-10-CM

## 2018-11-02 DIAGNOSIS — Z800 Family history of malignant neoplasm of digestive organs: Secondary | ICD-10-CM | POA: Diagnosis not present

## 2018-11-02 DIAGNOSIS — M129 Arthropathy, unspecified: Secondary | ICD-10-CM

## 2018-11-02 DIAGNOSIS — Z86010 Personal history of colonic polyps: Secondary | ICD-10-CM

## 2018-11-02 DIAGNOSIS — C50412 Malignant neoplasm of upper-outer quadrant of left female breast: Secondary | ICD-10-CM

## 2018-11-02 DIAGNOSIS — Z79810 Long term (current) use of selective estrogen receptor modulators (SERMs): Secondary | ICD-10-CM | POA: Diagnosis not present

## 2018-11-02 DIAGNOSIS — F329 Major depressive disorder, single episode, unspecified: Secondary | ICD-10-CM | POA: Diagnosis not present

## 2018-11-02 DIAGNOSIS — R0602 Shortness of breath: Secondary | ICD-10-CM

## 2018-11-02 DIAGNOSIS — Z87442 Personal history of urinary calculi: Secondary | ICD-10-CM

## 2018-11-02 DIAGNOSIS — C4360 Malignant melanoma of unspecified upper limb, including shoulder: Secondary | ICD-10-CM

## 2018-11-02 DIAGNOSIS — R69 Illness, unspecified: Secondary | ICD-10-CM | POA: Diagnosis not present

## 2018-11-02 DIAGNOSIS — C50011 Malignant neoplasm of nipple and areola, right female breast: Secondary | ICD-10-CM

## 2018-11-02 DIAGNOSIS — M05711 Rheumatoid arthritis with rheumatoid factor of right shoulder without organ or systems involvement: Secondary | ICD-10-CM

## 2018-11-02 DIAGNOSIS — R232 Flushing: Secondary | ICD-10-CM

## 2018-11-02 HISTORY — DX: Rheumatoid arthritis, unspecified: M06.9

## 2018-11-02 LAB — CBC WITH DIFFERENTIAL/PLATELET
Abs Immature Granulocytes: 0.04 10*3/uL (ref 0.00–0.07)
Basophils Absolute: 0.1 10*3/uL (ref 0.0–0.1)
Basophils Relative: 1 %
Eosinophils Absolute: 0.2 10*3/uL (ref 0.0–0.5)
Eosinophils Relative: 2 %
HCT: 42.4 % (ref 36.0–46.0)
Hemoglobin: 14 g/dL (ref 12.0–15.0)
Immature Granulocytes: 1 %
Lymphocytes Relative: 22 %
Lymphs Abs: 1.8 10*3/uL (ref 0.7–4.0)
MCH: 31.6 pg (ref 26.0–34.0)
MCHC: 33 g/dL (ref 30.0–36.0)
MCV: 95.7 fL (ref 80.0–100.0)
Monocytes Absolute: 0.5 10*3/uL (ref 0.1–1.0)
Monocytes Relative: 6 %
Neutro Abs: 5.5 10*3/uL (ref 1.7–7.7)
Neutrophils Relative %: 68 %
Platelets: 179 10*3/uL (ref 150–400)
RBC: 4.43 MIL/uL (ref 3.87–5.11)
RDW: 13.2 % (ref 11.5–15.5)
WBC: 8.1 10*3/uL (ref 4.0–10.5)
nRBC: 0 % (ref 0.0–0.2)

## 2018-11-02 LAB — COMPREHENSIVE METABOLIC PANEL
ALT: 13 U/L (ref 0–44)
AST: 19 U/L (ref 15–41)
Albumin: 4.1 g/dL (ref 3.5–5.0)
Alkaline Phosphatase: 58 U/L (ref 38–126)
Anion gap: 9 (ref 5–15)
BUN: 14 mg/dL (ref 8–23)
CO2: 29 mmol/L (ref 22–32)
Calcium: 8.9 mg/dL (ref 8.9–10.3)
Chloride: 104 mmol/L (ref 98–111)
Creatinine, Ser: 0.95 mg/dL (ref 0.44–1.00)
GFR calc Af Amer: >60 mL/min (ref >60)
GFR calc non Af Amer: 60 mL/min — ABNORMAL LOW (ref >60)
Glucose, Bld: 82 mg/dL (ref 70–99)
Potassium: 3.9 mmol/L (ref 3.5–5.1)
Sodium: 142 mmol/L (ref 135–145)
Total Bilirubin: 0.4 mg/dL (ref 0.3–1.2)
Total Protein: 7 g/dL (ref 6.5–8.1)

## 2018-11-02 NOTE — Progress Notes (Signed)
[place]  [phone] [phone]     ID: [name] DOB: [birth year]  MR#: [id number]  NAT#:[id number]  Patient Care Team: Delorse Limber as PCP - General (Physician Assistant) Josue Hector, MD (Cardiology) Cheri Fowler, MD as Attending Physician (Obstetrics and Gynecology) Leavy Heatherly, Virgie Dad, MD (Hematology and Oncology) Gatha Mayer, MD as Consulting Physician (Gastroenterology) Iona Beard, [place] as Referring Physician (Optometry) Jovita Kussmaul, MD as Consulting Physician (General Surgery) Ardis Hughs, MD as Attending Physician (Urology) Charolotte Capuchin, MD as Consulting Physician (Dentistry) Ivan Croft (Spine Surgery) Irene Limbo, MD as Consulting Physician (Plastic Surgery) OTHER MD:  CHIEF COMPLAINT: Bilateral breast cancers  CURRENT TREATMENT: Tamoxifen   INTERVAL HISTORY [name] returns today for follow-up of her estrogen receptor positive breast cancers.   She continues on tamoxifen with good tolerance. She reports hot flashes twice a day that sometimes wake her up at night. She also notes vaginal discharge.  Since her last visit, she underwent bone density testing on 03/27/2018. This showed a T-score of -1.1, which is considered mildly osteopenic.   She developed a right chest wall mass under her right clavicle in late December 2019. Head/neck soft tissue ultrasound performed on 05/25/2018 showed: heterogeneously echoic soft tissue at the right sternoclavicular joint, non-specific on ultrasound; differential includes infection, inflammation, and less likely metastatic disease. Chest CT performed on 05/29/2018 showed: fullness of the right sternoclavicular joint may reflect a complex effusion when compared with recent ultrasound; progressive small erosions versus subchondral cysts of the proximal right clavicle; constellation of findings are favored to reflect inflammatory or degenerative arthritis; septic  arthritis is not entirely excluded, although though to be less likely given the lack of surrounding soft tissue inflammation.  Finally she was found to have rheumatoid arthritis and this was felt to be the reason for the problem.   REVIEW OF SYSTEMS: [name] reports staying home most of the time. 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 Her husband continues to work in a Academic librarian, Psychologist, educational dye. She had back surgery in 03/2018 for severe pain, but she was told the surgery may not relieve her pain due to her having rheumatoid arthritis.  She tried methotrexate but it did not agree with her.  She reports she was told to try natural relief methods. She reports soreness to the upper outer quadrant of the right breast. She saw Dr. Iran Planas on 01/29/2018, but they did not make any plans to do anything as of yet considering the risks.  The patient denies unusual headaches, visual changes, nausea, vomiting, dizziness, or gait imbalance. There has been no cough, phlegm production, or pleurisy, no chest pain or pressure, and no change in bowel or bladder habits. The patient denies fever, rash, bleeding, unexplained fatigue or unexplained weight loss. A detailed review of systems was otherwise entirely negative.   HISTORY OF BREAST CANCER From the original intake note:  [name], now [name], was my patient in 1996, at which time she had an early stage breast cancer treated with mastectomy with TRAM flap reconstruction followed by CMF chemotherapy 8. She did not receive radiation or anti-estrogens. She was released from follow-up early this sensory.  More recently [name] had routine left screening mammography with tomography at the breast Center 04/27/2014. Breast density was category B. A possible mass in the left breast was noted and on 05/09/2014 she underwent diagnostic left mammography and ultrasonography  showing a 9 mm spiculated asymmetry in the left  breast upper outer quadrant. This was not palpable. Ultrasound identified a 1.0 cm irregular hypoechoic lesion in that area. Ultrasound of the left axilla was negative.  Biopsy of the left breast mass 05/09/2014 showed ([id number]) an invasive breast cancer with lobular features, grade 1. The tumor was estrogen receptor 100% positive, progesterone receptor 99% positive, both with strong staining intensity. The MIB-1 was 8%. HER-2 was not amplified, the signals ratio being 1.10 and the number per cell 1.70.  On 05/17/2014 the patient underwent bilateral breast MRI. This showed breast composition category C. In the upper outer quadrant of the left breast there was stippled non-masslike enhancement measuring 3.9 cm. Within this region there was an area of slightly increased consolidation corresponding to the smaller mass noted on ultrasonography. There were no abnormal appearing lymph nodes in the right breast was unremarkable.  [name]'s case was presented at the breast cancer multidisciplinary conference 05/18/2014. The patient is a candidate for breast conservation, and if she opts for that her surgeon may wish to do a generous lumpectomy or proceed to biopsy of the more extensive area of stippled non-masslike enhancement. It was felt that since the patient had a mastectomy on the right she might choose a mastectomy on the left. An Oncotype test was also recommended.  [name] was evaluated at the breast clinic 05/18/2014. Her subsequent history is as detailed below   PAST MEDICAL HISTORY: Past Medical History:  Diagnosis Date  . Arthritis    "spine" (07/28/2014)  . Blood dyscrasia    bruises and bleed easily  . Breast cancer, left breast ([place]) 07/28/2014  . Breast cancer, right breast ([place]) 1995  . Chronic back pain    "all over"  . Complication of anesthesia    went to sleep easily but hard to wake up up until elbow OR in 2010  . Depression   . Dyspnea    Normal Spirometry 03/2008 EF 65% BNP  normal 11/2007  . Family history of breast cancer   . Family history of colon cancer   . Family history of pancreatic cancer   . Gastric polyps   . GERD (gastroesophageal reflux disease)   . History of chicken pox   . History of hiatal hernia   . Hx of adenomatous polyp of colon 07/03/2015  . Kidney stone    right kidney   . Lichen sclerosus   . Melanoma ([place]) 2010   "right elbow; treated at [facility]"  . Mild anxiety   . Vitamin B12 deficiency   . Vitamin D deficiency     PAST SURGICAL HISTORY: Past Surgical History:  Procedure Laterality Date  . BREAST BIOPSY Left 04/2014  . BREAST RECONSTRUCTION WITH PLACEMENT OF TISSUE EXPANDER AND FLEX HD (ACELLULAR HYDRATED DERMIS) Left 07/28/2014   Procedure: LEFT BREAST RECONSTRUCTION PLACEMENT OF LEFT TISSUE EXPANDER ;  Surgeon: Crissie Reese, MD;  Location: [facility];  Service: Plastics;  Laterality: Left;  . BREAST RECONSTRUCTION WITH PLACEMENT OF TISSUE EXPANDER AND FLEX HD (ACELLULAR HYDRATED DERMIS) Left 09/08/2014   Procedure: REMOVAL OF TISSUE EXPANDER FROM LEFT BREAST;  Surgeon: Crissie Reese, MD;  Location: [facility];  Service: Plastics;  Laterality: Left;  . BUNIONECTOMY Bilateral 1970's  . COLONOSCOPY     Dr Sharlett Iles  . CYSTOSCOPY WITH RETROGRADE PYELOGRAM, URETEROSCOPY AND STENT PLACEMENT Right 06/09/2015   Procedure: CYSTOSCOPY WITH RIGHT RETROGRADE PYELOGRAM, RIGHT URETEROSCOPY AND RIGHT URTERAL STENT PLACEMENT;  Surgeon: Ardis Hughs, MD;  Location: [facility];  Service: Urology;  Laterality: Right;  . ESOPHAGOGASTRODUODENOSCOPY    . HERNIA REPAIR    . HOLMIUM LASER APPLICATION Right 7/82/9562   Procedure: HOLMIUM LASER APPLICATION;  Surgeon: Ardis Hughs, MD;  Location: [facility];  Service: Urology;  Laterality: Right;  . LATISSIMUS FLAP TO BREAST Left 09/08/2014   Procedure: LEFT LATISSIMUS FLAP TO BREAST WITH SALINE IMPLANT FOR BREAST RECONSTRUCTION;  Surgeon: Crissie Reese, MD;  Location: [facility];  Service: Plastics;  Laterality: Left;   . LUMBAR FUSION  01/2018   L5-S1 transitional segmental laminectomy and Fusion  . MASTECTOMY Right 1996    chemotherapy. pt. states 13 lymph nodes were removed  . MASTECTOMY COMPLETE / SIMPLE W/ SENTINEL NODE BIOPSY Left 07/28/2014  . MASTECTOMY W/ SENTINEL NODE BIOPSY Left 07/28/2014   Procedure: LEFT MASTECTOMY WITH SENTINEL LYMPH NODE MAPPING;  Surgeon: Autumn Messing III, MD;  Location: [facility];  Service: General;  Laterality: Left;  Marland Kitchen MELANOMA EXCISION Right 2010   From elbow-- [facility]   . NISSEN FUNDOPLICATION  13/0865  . RECONSTRUCTION BREAST IMMEDIATE / DELAYED W/ TISSUE EXPANDER Left 07/28/2014  . TEMPOROMANDIBULAR JOINT SURGERY Bilateral 1987  . TONSILLECTOMY      FAMILY HISTORY Family History  Problem Relation Age of Onset  . Colon cancer Cousin 28       double first cousin  . Colon cancer Cousin 24       double first cousin  . Colon polyps Father        between 15-20  . Dementia Father   . CAD Father        CABG at age 74  . Stroke Mother   . Pancreatic cancer Brother 41  . Colon polyps Brother        between 10-20  . Cancer Paternal Uncle        NOS  . Anemia Paternal Grandfather        pernicious anemia  . Breast cancer Cousin 86       double first cousin  . Breast cancer Cousin 74       paternal cousin  . Stroke Other        F 1st degree relative 11, M 1st degree relative  . Esophageal cancer Neg Hx   . Stomach cancer Neg Hx    the patient's father died at age 68 in the setting of Alzheimer's disease. The patient's mother died at age 76 following a stroke. Kashlynn has 3 brothers, no sisters. Elaysia's niece, Kela Millin, was my patient with a history of breast cancer diagnosed at age 92. She succumbed to that tumor. The patient also has 2 paternal cousins with breast cancer diagnosed at age 11 and age 15. One brother has prostate cancer diagnosed at age 84. The other brother has a history of throat cancer diagnosed at age 33. There is no other history of breast or  ovarian cancer in the family to her knowledge.  GENETICS TESTING:  MUTYH mutation, normal BRCA genes  GYNECOLOGIC HISTORY:  No LMP recorded. (Menstrual status: Chemotherapy). Menarche age 19, first live birth age 45. She is GX P2. She went through menopause in 1996 at the time of her chemotherapy. She did not take hormone replacement. 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 Her husband of 3 years, Marlou Sa, is a Gaffer at [employer]. He matches colors  for a [employer]. Issabelle has 2 children from a prior marriage, Becky Sax, 73 years old, lives in [place] and is a homemaker; and Corene Cornea, 61, who lives in [place] and works as a Freight forwarder. Armstead has 4 biological grandchildren. Marlou Sa has a son, Mitzi Hansen, who lives in [place] and worse at [employer]. Marlou Sa has 4 grandchildren of his. The patient is a Psychologist, forensic.    ADVANCED DIRECTIVES: In place; currently Derrick's children Becky Sax and Corene Cornea are co- healthcare powers of attorney. Corene Cornea can be reached at [place]: Social History   Tobacco Use  . Smoking status: Never Smoker  . Smokeless tobacco: Never Used  . Tobacco comment: Regular Exercise - Yes  Substance Use Topics  . Alcohol use: No  . Drug use: No     Colonoscopy: FEB 2017  PAP: 2013  Bone density: Remote  Lipid panel:  Allergies  Allergen Reactions  . Other Anaphylaxis and Rash  . Silodosin Rash    Facial rash  . Ace Inhibitors Other (See Comments)    REACTION: angioedema  . Buprenorphine Hcl Other (See Comments)    "crazy"  . Morphine And Related Other (See Comments)    "crazy" "crazy"  . Codeine Rash  . Sulfonamide Derivatives Rash  . Telmisartan Rash    Current Outpatient Medications  Medication Sig Dispense Refill  . cetirizine (ZYRTEC ALLERGY) 10 MG tablet Take 10 mg by  mouth as needed.     . Cholecalciferol (VITAMIN D3) 5000 units TABS Take by mouth daily.    . citalopram (CELEXA) 20 MG tablet Take 1 tablet by mouth once daily 90 tablet 0  . cyanocobalamin 2000 MCG tablet Take 5,000 mcg by mouth daily.     . folic acid (FOLVITE) 1 MG tablet Take 2 tablets (2 mg total) by mouth daily. 180 tablet 3  . furosemide (LASIX) 40 MG tablet Take 1 tablet by mouth once daily 90 tablet 0  . naproxen sodium (ALEVE) 220 MG tablet Take 220 mg by mouth as needed.    . Polyethylene Glycol 3350 (MIRALAX PO) Take by mouth as needed.    . tamoxifen (NOLVADEX) 10 MG tablet Take 10 mg by mouth 2 (two) times daily.    . vitamin E 400 UNIT capsule Take 400 Units by mouth daily.     No current facility-administered medications for this visit.      OBJECTIVE: Middle-aged white woman in no acute distress  Vitals:   11/02/18 1320  BP: (!) 129/59  Pulse: 73  Resp: 18  Temp: 99.5 F (37.5 C)  SpO2: 98%     Body mass index is 27.8 kg/m.    ECOG FS:1 - Symptomatic but completely ambulatory  Sclerae unicteric, pupils round and equal Wearing a mask No cervical or supraclavicular adenopathy Lungs no rales or rhonchi Heart regular rate and rhythm Abd soft, nontender, positive bowel sounds MSK no focal spinal tenderness, no upper extremity lymphedema Neuro: nonfocal, well oriented, appropriate affect Breasts: Status post bilateral mastectomies with bilateral flap reconstruction as well as a saline implant on the left.  There is no evidence of disease recurrence.  Both axillae are benign.  LAB RESULTS:  CMP     Component Value Date/Time   NA 142 11/02/2018 1256   NA 143 08/14/2016 1200   K 3.9 11/02/2018 1256   K 4.2 08/14/2016 1200   CL 104 11/02/2018 1256   CO2 29 11/02/2018 1256   CO2 27 08/14/2016 1200  GLUCOSE 82 11/02/2018 1256   GLUCOSE 67 (L) 08/14/2016 1200   BUN 14 11/02/2018 1256   BUN 13.3 08/14/2016 1200   CREATININE 0.95 11/02/2018 1256   CREATININE  0.89 07/27/2018 1344   CREATININE 0.9 08/14/2016 1200   CALCIUM 8.9 11/02/2018 1256   CALCIUM 9.7 08/14/2016 1200   PROT 7.0 11/02/2018 1256   PROT 6.6 08/14/2016 1200   ALBUMIN 4.1 11/02/2018 1256   ALBUMIN 3.8 08/14/2016 1200   AST 19 11/02/2018 1256   AST 24 08/14/2016 1200   ALT 13 11/02/2018 1256   ALT 23 08/14/2016 1200   ALKPHOS 58 11/02/2018 1256   ALKPHOS 71 08/14/2016 1200   BILITOT 0.4 11/02/2018 1256   BILITOT 0.56 08/14/2016 1200   GFRNONAA 60 (L) 11/02/2018 1256   GFRNONAA 65 07/27/2018 1344   GFRAA >60 11/02/2018 1256   GFRAA 75 07/27/2018 1344    INo results found for: SPEP, UPEP  Lab Results  Component Value Date   WBC 8.1 11/02/2018   NEUTROABS 5.5 11/02/2018   HGB 14.0 11/02/2018   HCT 42.4 11/02/2018   MCV 95.7 11/02/2018   PLT 179 11/02/2018      Chemistry      Component Value Date/Time   NA 142 11/02/2018 1256   NA 143 08/14/2016 1200   K 3.9 11/02/2018 1256   K 4.2 08/14/2016 1200   CL 104 11/02/2018 1256   CO2 29 11/02/2018 1256   CO2 27 08/14/2016 1200   BUN 14 11/02/2018 1256   BUN 13.3 08/14/2016 1200   CREATININE 0.95 11/02/2018 1256   CREATININE 0.89 07/27/2018 1344   CREATININE 0.9 08/14/2016 1200      Component Value Date/Time   CALCIUM 8.9 11/02/2018 1256   CALCIUM 9.7 08/14/2016 1200   ALKPHOS 58 11/02/2018 1256   ALKPHOS 71 08/14/2016 1200   AST 19 11/02/2018 1256   AST 24 08/14/2016 1200   ALT 13 11/02/2018 1256   ALT 23 08/14/2016 1200   BILITOT 0.4 11/02/2018 1256   BILITOT 0.56 08/14/2016 1200       No results found for: LABCA2  No components found for: LABCA125  No results for input(s): INR in the last 168 hours.  Urinalysis    Component Value Date/Time   COLORURINE YELLOW 06/27/2016 1048   APPEARANCEUR CLEAR 06/27/2016 1048   LABSPEC 1.015 06/27/2016 1048   PHURINE 7.5 06/27/2016 1048   GLUCOSEU NEGATIVE 06/27/2016 1048   HGBUR NEGATIVE 06/27/2016 1048   BILIRUBINUR NEGATIVE 06/27/2016 1048    BILIRUBINUR neg 02/29/2016 1621   KETONESUR NEGATIVE 06/27/2016 1048   PROTEINUR neg 02/29/2016 1621   PROTEINUR NEGATIVE 08/28/2014 1438   UROBILINOGEN 0.2 06/27/2016 1048   NITRITE NEGATIVE 06/27/2016 1048   LEUKOCYTESUR NEGATIVE 06/27/2016 1048    STUDIES: No results found.   ASSESSMENT: 73 y.o. [place] woman status post left breast upper outer quadrant biopsy 05/09/2014 for a clinical T1-T2, N0 (stage I-II) invasive lobular breast cancer, estrogen receptor 100% positive, progesterone receptor 99% positive, with an MIB-1 of 8%, and no HER-2 amplification.  (1) history of right modified radical mastectomy with TRAM reconstruction 1996, followed by chemotherapy for 6 months; did not receive antiestrogen therapy or radiation therapy adjuvantly  (2) tamoxifen started neoadjuvantly 05/18/2014  (3) status post left mastectomy and sentinel lymph node sampling 07/28/2014 for a pT1c pN0(i+). Stage IA invasive ductal carcinoma, grade 1, with repeat HER-2 again negative. Margins were ample  (4) Oncotype score of 3 predicts an outside the breast risk  of recurrence within 10 years of 4% if the patient's only systemic treatment is tamoxifen for 5 years. It also predicts no significant benefit from chemotherapy.  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 Sequencing and rearrangement analysis for the following 32 genes found no deleterious mutations: APC, ATM, BARD1, BMPR1A, BRCA1, BRCA2, BRIP1, CDH1, CDK4, CDKN2A, CHEK2, EPCAM, GREM1, MLH1, MRE11A, MSH2, MSH6, MUTYH, NBN, NF1, PALB2, PMS2, POLD1, POLE, PTEN, RAD50, RAD51D, SMAD4, SMARCA4, STK11, and TP53.  (a)  Heterozygous MUTYH mutations are not associated with an increased risk for cancer in the patient, however, other family members may be at risk for homozygous mutations and therefore family testing should be considered.   (7) anastrozole discontinued May 2019, tamoxifen resumed  (a) interactions with citalopram likely not clinically relevant  PLAN: Tyreonna is now just over 4 years out from definitive surgery for her breast cancer with no evidence of disease recurrence.  This is very favorable.  She is tolerating tamoxifen well and she has been on it 4-1/2 years.  The plan is to continue to a total of 7 years which will take as to the end of 2022  She appears to be stable as far as her rheumatoid arthritis is concerned.  This is being followed by rheumatology.  We discussed pandemic precautions in detail.  She knows to call for any issues that may develop before her next visit here, which will be in 1 year.    Chandra Feger, Virgie Dad, MD  11/02/18 1:59 PM Medical Oncology and Hematology [facility] [address] [place], [place] [address] Tel. [phone]    Fax. [phone]   I, Wilburn Mylar, am acting as scribe for Dr. Virgie Dad. [facility].  I, Lurline Del MD, have reviewed the above documentation for accuracy and completeness, and I agree with the above.

## 2018-11-03 DIAGNOSIS — L821 Other seborrheic keratosis: Secondary | ICD-10-CM | POA: Diagnosis not present

## 2018-11-03 DIAGNOSIS — D045 Carcinoma in situ of skin of trunk: Secondary | ICD-10-CM | POA: Diagnosis not present

## 2018-11-03 DIAGNOSIS — Z85820 Personal history of malignant melanoma of skin: Secondary | ICD-10-CM | POA: Diagnosis not present

## 2018-11-03 DIAGNOSIS — D1801 Hemangioma of skin and subcutaneous tissue: Secondary | ICD-10-CM | POA: Diagnosis not present

## 2018-11-03 DIAGNOSIS — L570 Actinic keratosis: Secondary | ICD-10-CM | POA: Diagnosis not present

## 2018-11-03 DIAGNOSIS — L814 Other melanin hyperpigmentation: Secondary | ICD-10-CM | POA: Diagnosis not present

## 2018-12-25 DIAGNOSIS — N760 Acute vaginitis: Secondary | ICD-10-CM | POA: Diagnosis not present

## 2018-12-25 DIAGNOSIS — N898 Other specified noninflammatory disorders of vagina: Secondary | ICD-10-CM | POA: Diagnosis not present

## 2019-01-06 ENCOUNTER — Other Ambulatory Visit: Payer: Self-pay | Admitting: Physician Assistant

## 2019-02-04 ENCOUNTER — Ambulatory Visit: Payer: Medicare HMO

## 2019-02-08 DIAGNOSIS — H25013 Cortical age-related cataract, bilateral: Secondary | ICD-10-CM | POA: Diagnosis not present

## 2019-02-08 DIAGNOSIS — H2513 Age-related nuclear cataract, bilateral: Secondary | ICD-10-CM | POA: Diagnosis not present

## 2019-02-08 DIAGNOSIS — H43313 Vitreous membranes and strands, bilateral: Secondary | ICD-10-CM | POA: Diagnosis not present

## 2019-02-17 ENCOUNTER — Other Ambulatory Visit: Payer: Self-pay

## 2019-02-17 ENCOUNTER — Encounter: Payer: Self-pay | Admitting: Physician Assistant

## 2019-02-17 ENCOUNTER — Ambulatory Visit (INDEPENDENT_AMBULATORY_CARE_PROVIDER_SITE_OTHER): Payer: Medicare HMO | Admitting: Physician Assistant

## 2019-02-17 VITALS — BP 118/78 | HR 62 | Temp 97.9°F | Resp 16 | Ht 66.5 in | Wt 180.0 lb

## 2019-02-17 DIAGNOSIS — Z23 Encounter for immunization: Secondary | ICD-10-CM | POA: Diagnosis not present

## 2019-02-17 DIAGNOSIS — Z0000 Encounter for general adult medical examination without abnormal findings: Secondary | ICD-10-CM | POA: Diagnosis not present

## 2019-02-17 DIAGNOSIS — I10 Essential (primary) hypertension: Secondary | ICD-10-CM | POA: Diagnosis not present

## 2019-02-17 LAB — CBC WITH DIFFERENTIAL/PLATELET
Basophils Absolute: 0 10*3/uL (ref 0.0–0.1)
Basophils Relative: 0.6 % (ref 0.0–3.0)
Eosinophils Absolute: 0.2 10*3/uL (ref 0.0–0.7)
Eosinophils Relative: 3.6 % (ref 0.0–5.0)
HCT: 42 % (ref 36.0–46.0)
Hemoglobin: 14 g/dL (ref 12.0–15.0)
Lymphocytes Relative: 24.6 % (ref 12.0–46.0)
Lymphs Abs: 1.5 10*3/uL (ref 0.7–4.0)
MCHC: 33.4 g/dL (ref 30.0–36.0)
MCV: 92.9 fl (ref 78.0–100.0)
Monocytes Absolute: 0.5 10*3/uL (ref 0.1–1.0)
Monocytes Relative: 7.5 % (ref 3.0–12.0)
Neutro Abs: 4 10*3/uL (ref 1.4–7.7)
Neutrophils Relative %: 63.7 % (ref 43.0–77.0)
Platelets: 158 10*3/uL (ref 150.0–400.0)
RBC: 4.53 Mil/uL (ref 3.87–5.11)
RDW: 13.5 % (ref 11.5–15.5)
WBC: 6.3 10*3/uL (ref 4.0–10.5)

## 2019-02-17 LAB — COMPREHENSIVE METABOLIC PANEL
ALT: 12 U/L (ref 0–35)
AST: 16 U/L (ref 0–37)
Albumin: 4 g/dL (ref 3.5–5.2)
Alkaline Phosphatase: 45 U/L (ref 39–117)
BUN: 14 mg/dL (ref 6–23)
CO2: 30 mEq/L (ref 19–32)
Calcium: 9.1 mg/dL (ref 8.4–10.5)
Chloride: 103 mEq/L (ref 96–112)
Creatinine, Ser: 0.81 mg/dL (ref 0.40–1.20)
GFR: 69.3 mL/min (ref >60.00)
Glucose, Bld: 89 mg/dL (ref 70–99)
Potassium: 4 mEq/L (ref 3.5–5.1)
Sodium: 140 mEq/L (ref 135–145)
Total Bilirubin: 0.4 mg/dL (ref 0.2–1.2)
Total Protein: 6.2 g/dL (ref 6.0–8.3)

## 2019-02-17 LAB — LIPID PANEL
Cholesterol: 197 mg/dL (ref 0–200)
HDL: 40.5 mg/dL (ref >39.00)
LDL Cholesterol: 133 mg/dL — ABNORMAL HIGH (ref 0–99)
NonHDL: 156.1
Total CHOL/HDL Ratio: 5
Triglycerides: 116 mg/dL (ref 0.0–149.0)
VLDL: 23.2 mg/dL (ref 0.0–40.0)

## 2019-02-17 LAB — HEMOGLOBIN A1C: Hgb A1c MFr Bld: 5.6 % (ref 4.6–6.5)

## 2019-02-17 MED ORDER — CITALOPRAM HYDROBROMIDE 20 MG PO TABS: 20.0000 mg | ORAL_TABLET | Freq: Every day | ORAL | 1 refills | Status: DC

## 2019-02-17 NOTE — Patient Instructions (Signed)
Please go to the lab today for blood work.  I will call you with your results. We will alter treatment regimen(s) if indicated by your results.   Please continue Citalopram as directed.  BP continues to look good. Keep hydrated and keep a low-salt diet.   Follow-up with specialists as scheduled.    Preventive Care 73 Years and Older, Female Preventive care refers to lifestyle choices and visits with your health care provider that can promote health and wellness. This includes:  A yearly physical exam. This is also called an annual well check.  Regular dental and eye exams.  Immunizations.  Screening for certain conditions.  Healthy lifestyle choices, such as diet and exercise. What can I expect for my preventive care visit? Physical exam Your health care provider will check:  Height and weight. These may be used to calculate body mass index (BMI), which is a measurement that tells if you are at a healthy weight.  Heart rate and blood pressure.  Your skin for abnormal spots. Counseling Your health care provider may ask you questions about:  Alcohol, tobacco, and drug use.  Emotional well-being.  Home and relationship well-being.  Sexual activity.  Eating habits.  History of falls.  Memory and ability to understand (cognition).  Work and work Statistician.  Pregnancy and menstrual history. What immunizations do I need?  Influenza (flu) vaccine  This is recommended every year. Tetanus, diphtheria, and pertussis (Tdap) vaccine  You may need a Td booster every 10 years. Varicella (chickenpox) vaccine  You may need this vaccine if you have not already been vaccinated. Zoster (shingles) vaccine  You may need this after age 73. Pneumococcal conjugate (PCV13) vaccine  One dose is recommended after age 73. Pneumococcal polysaccharide (PPSV23) vaccine  One dose is recommended after age 73. Measles, mumps, and rubella (MMR) vaccine  You may need at least  one dose of MMR if you were born in 1957 or later. You may also need a second dose. Meningococcal conjugate (MenACWY) vaccine  You may need this if you have certain conditions. Hepatitis A vaccine  You may need this if you have certain conditions or if you travel or work in places where you may be exposed to hepatitis A. Hepatitis B vaccine  You may need this if you have certain conditions or if you travel or work in places where you may be exposed to hepatitis B. Haemophilus influenzae type b (Hib) vaccine  You may need this if you have certain conditions. You may receive vaccines as individual doses or as more than one vaccine together in one shot (combination vaccines). Talk with your health care provider about the risks and benefits of combination vaccines. What tests do I need? Blood tests  Lipid and cholesterol levels. These may be checked every 5 years, or more frequently depending on your overall health.  Hepatitis C test.  Hepatitis B test. Screening  Lung cancer screening. You may have this screening every year starting at age 73 if you have a 30-pack-year history of smoking and currently smoke or have quit within the past 15 years.  Colorectal cancer screening. All adults should have this screening starting at age 73 and continuing until age 39. Your health care provider may recommend screening at age 73 if you are at increased risk. You will have tests every 1-10 years, depending on your results and the type of screening test.  Diabetes screening. This is done by checking your blood sugar (glucose) after you have not  eaten for a while (fasting). You may have this done every 1-3 years.  Mammogram. This may be done every 1-2 years. Talk with your health care provider about how often you should have regular mammograms.  BRCA-related cancer screening. This may be done if you have a family history of breast, ovarian, tubal, or peritoneal cancers. Other tests  Sexually  transmitted disease (STD) testing.  Bone density scan. This is done to screen for osteoporosis. You may have this done starting at age 73. Follow these instructions at home: Eating and drinking  Eat a diet that includes fresh fruits and vegetables, whole grains, lean protein, and low-fat dairy products. Limit your intake of foods with high amounts of sugar, saturated fats, and salt.  Take vitamin and mineral supplements as recommended by your health care provider.  Do not drink alcohol if your health care provider tells you not to drink.  If you drink alcohol: ? Limit how much you have to 0-1 drink a day. ? Be aware of how much alcohol is in your drink. In the [place] one drink equals one 12 oz bottle of beer (355 mL), one 5 oz glass of wine (148 mL), or one 1 oz glass of hard liquor (44 mL). Lifestyle  Take daily care of your teeth and gums.  Stay active. Exercise for at least 30 minutes on 5 or more days each week.  Do not use any products that contain nicotine or tobacco, such as cigarettes, e-cigarettes, and chewing tobacco. If you need help quitting, ask your health care provider.  If you are sexually active, practice safe sex. Use a condom or other form of protection in order to prevent STIs (sexually transmitted infections).  Talk with your health care provider about taking a low-dose aspirin or statin. What's next?  Go to your health care provider once a year for a well check visit.  Ask your health care provider how often you should have your eyes and teeth checked.  Stay up to date on all vaccines. This information is not intended to replace advice given to you by your health care provider. Make sure you discuss any questions you have with your health care provider. Document Released: 06/09/2015 Document Revised: 05/07/2018 Document Reviewed: 05/07/2018 Elsevier Patient Education  2020 [employer].

## 2019-02-17 NOTE — Progress Notes (Signed)
Subjective:   [name] is a 73 y.o. female who presents for Medicare Annual (Subsequent) preventive examination.  Review of Systems:  Review of Systems  Constitutional: Negative for fever and weight loss.  HENT: Negative for ear discharge, ear pain, hearing loss and tinnitus.   Eyes: Negative for blurred vision, double vision, photophobia and pain.  Respiratory: Negative for cough and shortness of breath.   Cardiovascular: Negative for chest pain and palpitations.  Gastrointestinal: Negative for abdominal pain, blood in stool, constipation, diarrhea, heartburn, melena, nausea and vomiting.  Genitourinary: Negative for dysuria, flank pain, frequency, hematuria and urgency.  Musculoskeletal: Negative for falls.  Neurological: Negative for dizziness, loss of consciousness and headaches.  Endo/Heme/Allergies: Negative for environmental allergies.  Psychiatric/Behavioral: Negative for depression, hallucinations, substance abuse and suicidal ideas. The patient is not nervous/anxious and does not have insomnia.     Objective:     Vitals: BP 118/78   Pulse 62   Temp 97.9 F (36.6 C) (Skin)   Resp 16   Ht 5' 6.5" (1.689 m)   Wt 180 lb (81.6 kg)   SpO2 99%   BMI 28.62 kg/m   Body mass index is 28.62 kg/m.  Advanced Directives 02/17/2019 02/17/2019 01/29/2018 06/27/2016 04/04/2016 02/22/2016 06/14/2015  Does Patient Have a Medical Advance Directive? - Yes Yes Yes Yes Yes Yes  Type of Paramedic of [place];Living will [place];Living will [place];Living will [place];Living will [place];Living will - [place];Living will  Does patient want to make changes to medical advance directive? - - - - - - No - Patient declined  Copy of [place] in Chart? Yes - validated most recent copy scanned in chart (See row information) - No - copy requested No -  copy requested - - No - copy requested    Tobacco Social History   Tobacco Use  Smoking Status Never Smoker  Smokeless Tobacco Never Used  Tobacco Comment   Regular Exercise - Yes     Counseling given: Yes Comment: Regular Exercise - Yes   Clinical Intake:  Pre-visit preparation completed: No  Pain : No/denies pain     BMI - recorded: 28.62 Nutritional Status: BMI 25 -29 Overweight Diabetes: No     Interpreter Needed?: No     Past Medical History:  Diagnosis Date  . Arthritis    "spine" (07/28/2014)  . Blood dyscrasia    bruises and bleed easily  . Breast cancer, left breast ([place]) 07/28/2014  . Breast cancer, right breast ([place]) 1995  . Chronic back pain    "all over"  . Complication of anesthesia    went to sleep easily but hard to wake up up until elbow OR in 2010  . Depression   . Dyspnea    Normal Spirometry 03/2008 EF 65% BNP normal 11/2007  . Family history of breast cancer   . Family history of colon cancer   . Family history of pancreatic cancer   . Gastric polyps   . GERD (gastroesophageal reflux disease)   . History of chicken pox   . History of hiatal hernia   . Hx of adenomatous polyp of colon 07/03/2015  . Kidney stone    right kidney   . Lichen sclerosus   . Melanoma ([place]) 2010   "right elbow; [facility]"  . Mild anxiety   . Vitamin B12 deficiency   . Vitamin D  deficiency    Past Surgical History:  Procedure Laterality Date  . BREAST BIOPSY Left 04/2014  . BREAST RECONSTRUCTION WITH PLACEMENT OF TISSUE EXPANDER AND FLEX HD (ACELLULAR HYDRATED DERMIS) Left 07/28/2014   Procedure: LEFT BREAST RECONSTRUCTION PLACEMENT OF LEFT TISSUE EXPANDER ;  Surgeon: Crissie Reese, MD;  Location: [place];  Service: Plastics;  Laterality: Left;  . BREAST RECONSTRUCTION WITH PLACEMENT OF TISSUE EXPANDER AND FLEX HD (ACELLULAR HYDRATED DERMIS) Left 09/08/2014   Procedure: REMOVAL OF TISSUE EXPANDER FROM LEFT BREAST;  Surgeon: Crissie Reese, MD;  Location: [place];  Service: Plastics;  Laterality: Left;  . BUNIONECTOMY Bilateral 1970's  . COLONOSCOPY     Dr Sharlett Iles  . CYSTOSCOPY WITH RETROGRADE PYELOGRAM, URETEROSCOPY AND STENT PLACEMENT Right 06/09/2015   Procedure: CYSTOSCOPY WITH RIGHT RETROGRADE PYELOGRAM, RIGHT URETEROSCOPY AND RIGHT URTERAL STENT PLACEMENT;  Surgeon: Ardis Hughs, MD;  Location: [facility];  Service: Urology;  Laterality: Right;  . ESOPHAGOGASTRODUODENOSCOPY    . HERNIA REPAIR    . HOLMIUM LASER APPLICATION Right [id number]   Procedure: HOLMIUM LASER APPLICATION;  Surgeon: Ardis Hughs, MD;  Location[facility];  Service: Urology;  Laterality: Right;  . LATISSIMUS FLAP TO BREAST Left 09/08/2014   Procedure: LEFT LATISSIMUS FLAP TO BREAST WITH SALINE IMPLANT FOR BREAST RECONSTRUCTION;  Surgeon: Crissie Reese, MD;  Location: [place];  Service: Plastics;  Laterality: Left;  . LUMBAR FUSION  01/2018   L5-S1 transitional segmental laminectomy and Fusion  . MASTECTOMY Right 1996    chemotherapy. pt. states 13 lymph nodes were removed  . MASTECTOMY COMPLETE / SIMPLE W/ SENTINEL NODE BIOPSY Left 07/28/2014  . MASTECTOMY W/ SENTINEL NODE BIOPSY Left 07/28/2014   Procedure: LEFT MASTECTOMY WITH SENTINEL LYMPH NODE MAPPING;  Surgeon: Autumn Messing III, MD;  Location: [place];  Service: General;  Laterality: Left;  Marland Kitchen MELANOMA EXCISION Right 2010   From elbow-- [facility]   . NISSEN FUNDOPLICATION  [id number]  . RECONSTRUCTION BREAST IMMEDIATE / DELAYED W/ TISSUE EXPANDER Left 07/28/2014  . TEMPOROMANDIBULAR JOINT SURGERY Bilateral 1987  . TONSILLECTOMY     Family History  Problem Relation Age of Onset  . Colon cancer Cousin 32       double first cousin  . Colon cancer Cousin 29       double first cousin  . Colon polyps Father        between 8-20  . Dementia Father   . CAD Father        CABG at age 28  . Stroke Mother   . Pancreatic cancer Brother 37  . Colon polyps Brother        between 10-20  . Cancer Paternal Uncle        NOS   . Anemia Paternal Grandfather        pernicious anemia  . Breast cancer Cousin 21       double first cousin  . Breast cancer Cousin 74       paternal cousin  . Stroke Other        F 1st degree relative 6, M 1st degree relative  . Esophageal cancer Neg Hx   . Stomach cancer Neg Hx    Social History   Socioeconomic History  . Marital status: Married    Spouse name: Not on file  . Number of children: 2  . Years of education: Not on file  . Highest education level: Not on file  Occupational History  . Occupation: Freight forwarder  Social Needs  . Financial resource strain: Not on file  . Food insecurity    Worry: Not on file    Inability: Not on file  . Transportation needs    Medical: Not on file    Non-medical: Not on file  Tobacco Use  . Smoking status: Never Smoker  . Smokeless tobacco: Never Used  . Tobacco comment: Regular Exercise - Yes  Substance and Sexual Activity  . Alcohol use: No  . Drug use: No  . Sexual activity: Yes  Lifestyle  . Physical activity    Days per week: Not on file    Minutes per session: Not on file  . Stress: Not on file  Relationships  . Social Herbalist on phone: Not on file    Gets together: Not on file    Attends religious service: Not on file    Active member of club or organization: Not on file    Attends meetings of clubs or organizations: Not on file    Relationship status: Not on file  Other Topics Concern  . Not on file  Social History Narrative   She lives with husband.     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 cetirizine (ZYRTEC ALLERGY) 10 MG tablet Take 10 mg by mouth as needed.   . Cholecalciferol (VITAMIN D3) 5000 units TABS Take by mouth daily.  . citalopram (CELEXA) 20 MG tablet Take 1 tablet (20 mg total) by mouth daily.  . cyanocobalamin 2000 MCG tablet Take 5,000 mcg by mouth  daily.   . folic acid (FOLVITE) 1 MG tablet Take 2 tablets (2 mg total) by mouth daily.  . furosemide (LASIX) 40 MG tablet Take 1 tablet by mouth once daily  . naproxen sodium (ALEVE) 220 MG tablet Take 220 mg by mouth as needed.  . Polyethylene Glycol 3350 (MIRALAX PO) Take by mouth as needed.  . tamoxifen (NOLVADEX) 20 MG tablet Take 1 tablet by mouth daily.  . vitamin E 400 UNIT capsule Take 400 Units by mouth daily.  . [DISCONTINUED] citalopram (CELEXA) 20 MG tablet Take 1 tablet by mouth once daily  . [DISCONTINUED] tamoxifen (NOLVADEX) 10 MG tablet Take 10 mg by mouth 2 (two) times daily.   No facility-administered encounter medications on file as of 02/17/2019.     Activities of Daily Living In your present state of health, do you have any difficulty performing the following activities: 02/17/2019 04/07/2018  Hearing? N N  Vision? N N  Difficulty concentrating or making decisions? N N  Walking or climbing stairs? N N  Dressing or bathing? N N  Doing errands, shopping? N N  Preparing Food and eating ? N -  Using the Toilet? N -  In the past six months, have you accidently leaked urine? N -  Do you have problems with loss of bowel control? N -  Managing your Medications? N -  Managing your Finances? N -  Housekeeping or managing your Housekeeping? N -  Some recent data might be hidden    Patient Care Team: Delorse Limber as PCP - General (Physician Assistant) Josue Hector, MD (Cardiology) Cheri Fowler, MD as Attending Physician (Obstetrics and Gynecology) Magrinat, Virgie Dad, MD (Hematology and Oncology) Gatha Mayer, MD as Consulting Physician (Gastroenterology) Iona Beard, [place] as Referring Physician (Optometry) Autumn Messing  III, MD as Consulting Physician (General Surgery) Ardis Hughs, MD as Attending Physician (Urology) Charolotte Capuchin, MD as Consulting Physician (Dentistry) Ivan Croft (Spine Surgery) Irene Limbo, MD as Consulting  Physician (Plastic Surgery)    Assessment:   This is a routine wellness examination for Sylvia.  Exercise Activities and Dietary recommendations Current Exercise Habits: The patient does not participate in regular exercise at present  Goals    . Increase physical activity     Has silver sneakers. Plans to use this year.   Walking and water aerobics      . Travel more and visit with family.   (pt-stated)       Fall Risk Fall Risk  02/17/2019 02/17/2019 01/29/2018 02/17/2017 06/27/2016  Falls in the past year? 0 0 No Yes No  Number falls in past yr: 0 0 - 2 or more -  Injury with Fall? 0 0 - No -  Risk Factor Category  - - - High Fall Risk -  Follow up Falls evaluation completed Falls evaluation completed - Falls evaluation completed -   Is the patient's home free of loose throw rugs in walkways, pet beds, electrical cords, etc?   yes      Grab bars in the bathroom? yes      Handrails on the stairs?   yes      Adequate lighting?   yes  Depression Screen PHQ 2/9 Scores 02/17/2019 04/07/2018 01/29/2018 12/05/2017  PHQ - 2 Score 2 0 0 0  PHQ- 9 Score 2 0 1 2     Cognitive Function MMSE - Mini Mental State Exam 02/17/2019 01/29/2018 04/06/2015  Orientation to time 5 5 5   Orientation to Place 5 5 5   Registration 3 3 3   Attention/ Calculation 5 5 5   Recall 3 2 3   Language- name 2 objects 2 2 2   Language- repeat 1 1 1   Language- follow 3 step command 3 3 3   Language- read & follow direction 1 1 1   Write a sentence 1 1 1   Copy design 1 1 1   Total score 30 29 30         Immunization History  Administered Date(s) Administered  . Fluad Quad(high Dose 65+) 02/17/2019  . Influenza Split 02/06/2011, 02/10/2012  . Influenza Whole 04/14/2007  . Influenza, High Dose Seasonal PF 04/06/2015, 03/18/2017, 01/29/2018  . Influenza,inj,Quad PF,6+ Mos 07/29/2014, 02/20/2016  . Pneumococcal Conjugate-13 02/20/2016  . Pneumococcal Polysaccharide-23 07/29/2014  . Tdap 01/03/2013  . Tetanus  01/04/2008  . Zoster Recombinat (Shingrix) 02/14/2018, 06/02/2018    Qualifies for Shingles Vaccine?Shingrix completed  Screening Tests Health Maintenance  Topic Date Due  . INFLUENZA VACCINE  12/26/2018  . COLONOSCOPY  06/27/2020  . DTaP/Tdap/Td (2 - Td) 01/04/2023  . TETANUS/TDAP  01/04/2023  . DEXA SCAN  Completed  . Hepatitis C Screening  Completed  . PNA vac Low Risk Adult  Completed    Cancer Screenings: Lung: Low Dose CT Chest recommended if Age 73-80 years, 30 pack-year currently smoking OR have quit w/in 15years. Patient does not qualify. Breast:  Up to date on Mammogram?s/p bilateral mastectomy Up to date of Bone Density/Dexa? Yes Colorectal: UTD  Additional Screenings: Hepatitis C Screening: Completed     Plan:  Medicare Wellness, Subsequent  During the course of the visit the patient was educated and counseled about appropriate screening and preventive services including: Fall prevention, Bone densitometry screening, Diabetes screening, Nutrition counseling.  Patient URD on required immunizations. Yearly flu shot given today.  Will obtain fasting labs at today's visit.   Patient Instructions (the written plan) was given to the patient.    I have personally reviewed and noted the following in the patient's chart:   . Medical and social history . Use of alcohol, tobacco or illicit drugs  . Current medications and supplements . Functional ability and status . Nutritional status . Physical activity . Advanced directives . List of other physicians . Hospitalizations, surgeries, and ER visits in previous 12 months . Vitals . Screenings to include cognitive, depression, and falls . Referrals and appointments  In addition, I have reviewed and discussed with patient certain preventive protocols, quality metrics, and best practice recommendations. A written personalized care plan for preventive services as well as general preventive health recommendations were  provided to patient.     Leeanne Rio, PA-C  02/17/2019

## 2019-02-19 ENCOUNTER — Other Ambulatory Visit: Payer: Self-pay | Admitting: Emergency Medicine

## 2019-02-19 DIAGNOSIS — E7800 Pure hypercholesterolemia, unspecified: Secondary | ICD-10-CM

## 2019-02-19 MED ORDER — RED YEAST RICE 600 MG PO CAPS: 1.0000 | ORAL_CAPSULE | Freq: Every day | ORAL | 0 refills | Status: DC

## 2019-02-20 ENCOUNTER — Other Ambulatory Visit: Payer: Self-pay | Admitting: Physician Assistant

## 2019-03-23 NOTE — Progress Notes (Deleted)
Office Visit Note  Patient: [name]             Date of Birth: [birth year]           MRN: [id number]             PCP: Delorse Limber Referring: Brunetta Jeans, PA-C Visit Date: 04/05/2019 Occupation: @GUAROCC @  Subjective:  No chief complaint on file.   History of Present Illness: [name] is a 73 y.o. female ***   Activities of Daily Living:  Patient reports morning stiffness for *** {minute/hour:19697}.   Patient {ACTIONS;DENIES/REPORTS:21021675::"Denies"} nocturnal pain.  Difficulty dressing/grooming: {ACTIONS;DENIES/REPORTS:21021675::"Denies"} Difficulty climbing stairs: {ACTIONS;DENIES/REPORTS:21021675::"Denies"} Difficulty getting out of chair: {ACTIONS;DENIES/REPORTS:21021675::"Denies"} Difficulty using hands for taps, buttons, cutlery, and/or writing: {ACTIONS;DENIES/REPORTS:21021675::"Denies"}  No Rheumatology ROS completed.   PMFS History:  Patient Active Problem List   Diagnosis Date Noted  . Rheumatoid arthritis ([place]) 11/02/2018  . Episode of recurrent major depressive disorder ([place]) 12/05/2017  . Primary osteoarthritis of both hands 07/09/2017  . Primary osteoarthritis of both knees 07/09/2017  . DDD (degenerative disc disease), cervical 07/09/2017  . DDD (degenerative disc disease), lumbar 07/09/2017  . Peripheral edema 11/26/2016  . Medicare annual wellness visit, subsequent 06/30/2016  . Hx of adenomatous polyp of colon 07/03/2015  . 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 Family history of breast cancer   . Family history of colon cancer   . Family history of pancreatic cancer   . Malignant neoplasm of upper-outer quadrant of left breast in female, estrogen receptor positive ([place]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  . Peripheral neuropathy 01/01/2013  . Melanoma of upper arm ([place]) 02/10/2012  . S/P laparoscopic fundoplication [id number]  . FATTY LIVER DISEASE 12/07/2009  . COAGULOPATHY 07/12/2008  . CHEST XRAY, ABNORMAL 01/04/2008  . Vitamin  D deficiency 04/14/2007  . VITAMIN B12 DEFICIENCY 02/27/2007  . Essential hypertension 02/27/2007  . GERD 02/27/2007  . Fibromyalgia 02/27/2007    Past Medical History:  Diagnosis Date  . Arthritis    "spine" (07/28/2014)  . Blood dyscrasia    bruises and bleed easily  . Breast cancer, left breast ([place]) 07/28/2014  . Breast cancer, right breast ([place]) 1995  . Chronic back pain    "all over"  . Complication of anesthesia    went to sleep easily but hard to wake up up until elbow OR in 2010  . Depression   . Dyspnea    Normal Spirometry 03/2008 EF 65% BNP normal 11/2007  . Family history of breast cancer   . Family history of colon cancer   . Family history of pancreatic cancer   . Gastric polyps   . GERD (gastroesophageal reflux disease)   . History of chicken pox   . History of hiatal hernia   . Hx of adenomatous polyp of colon 07/03/2015  . Kidney stone    right kidney   . Lichen sclerosus   . Melanoma ([place]) 2010   "right elbow; treated at [facility]"  . Mild anxiety   . Vitamin B12 deficiency   . Vitamin D deficiency     Family History  Problem Relation Age of Onset  . Colon cancer Cousin 56       double first cousin  . Colon cancer Cousin 11       double first cousin  . Colon polyps Father        between 57-20  . Dementia Father   . CAD Father        CABG  at age 41  . Stroke Mother   . Pancreatic cancer Brother 36  . Colon polyps Brother        between 10-20  . Cancer Paternal Uncle        NOS  . Anemia Paternal Grandfather        pernicious anemia  . Breast cancer Cousin 21       double first cousin  . Breast cancer Cousin 74       paternal cousin  . Stroke Other        F 1st degree relative 78, M 1st degree relative  . Esophageal cancer Neg Hx   . Stomach cancer Neg Hx    Past Surgical History:  Procedure Laterality Date  . BREAST BIOPSY Left 04/2014  . BREAST RECONSTRUCTION WITH PLACEMENT OF TISSUE EXPANDER AND FLEX HD (ACELLULAR HYDRATED DERMIS) Left  07/28/2014   Procedure: LEFT BREAST RECONSTRUCTION PLACEMENT OF LEFT TISSUE EXPANDER ;  Surgeon: Crissie Reese, MD;  Location: [facility];  Service: Plastics;  Laterality: Left;  . BREAST RECONSTRUCTION WITH PLACEMENT OF TISSUE EXPANDER AND FLEX HD (ACELLULAR HYDRATED DERMIS) Left 09/08/2014   Procedure: REMOVAL OF TISSUE EXPANDER FROM LEFT BREAST;  Surgeon: Crissie Reese, MD;  Location: [facility];  Service: Plastics;  Laterality: Left;  . BUNIONECTOMY Bilateral 1970's  . COLONOSCOPY     Dr Sharlett Iles  . CYSTOSCOPY WITH RETROGRADE PYELOGRAM, URETEROSCOPY AND STENT PLACEMENT Right 06/09/2015   Procedure: CYSTOSCOPY WITH RIGHT RETROGRADE PYELOGRAM, RIGHT URETEROSCOPY AND RIGHT URTERAL STENT PLACEMENT;  Surgeon: Ardis Hughs, MD;  Location: [facility];  Service: Urology;  Laterality: Right;  . ESOPHAGOGASTRODUODENOSCOPY    . HERNIA REPAIR    . HOLMIUM LASER APPLICATION Right [id number]   Procedure: HOLMIUM LASER APPLICATION;  Surgeon: Ardis Hughs, MD;  Location: [facility];  Service: Urology;  Laterality: Right;  . LATISSIMUS FLAP TO BREAST Left 09/08/2014   Procedure: LEFT LATISSIMUS FLAP TO BREAST WITH SALINE IMPLANT FOR BREAST RECONSTRUCTION;  Surgeon: Crissie Reese, MD;  Location: [facility];  Service: Plastics;  Laterality: Left;  . LUMBAR FUSION  01/2018   L5-S1 transitional segmental laminectomy and Fusion  . MASTECTOMY Right 1996    chemotherapy. pt. states 13 lymph nodes were removed  . MASTECTOMY COMPLETE / SIMPLE W/ SENTINEL NODE BIOPSY Left 07/28/2014  . MASTECTOMY W/ SENTINEL NODE BIOPSY Left 07/28/2014   Procedure: LEFT MASTECTOMY WITH SENTINEL LYMPH NODE MAPPING;  Surgeon: Autumn Messing III, MD;  Location: [place] [facility];  Service: General;  Laterality: Left;  Marland Kitchen MELANOMA EXCISION Right 2010   From elbow-- [facility]   . NISSEN FUNDOPLICATION  [id number]  . RECONSTRUCTION BREAST IMMEDIATE / DELAYED W/ TISSUE EXPANDER Left 07/28/2014  . TEMPOROMANDIBULAR JOINT SURGERY Bilateral 1987  . TONSILLECTOMY     Social  History   Social History Narrative   She lives with husband.     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 [employer] History  Administered Date(s) Administered  . Fluad Quad(high Dose 65+) 02/17/2019  . Influenza Split 02/06/2011, 02/10/2012  . Influenza Whole 04/14/2007  . Influenza, High Dose Seasonal PF 04/06/2015, 03/18/2017, 01/29/2018  . Influenza,inj,Quad PF,6+ Mos 07/29/2014, 02/20/2016  . Pneumococcal Conjugate-13 02/20/2016  . Pneumococcal Polysaccharide-23 07/29/2014  . Tdap 01/03/2013  . Tetanus 01/04/2008  . Zoster Recombinat (Shingrix) 02/14/2018, 06/02/2018     Objective: Vital Signs: There were no vitals taken for this visit.   Physical Exam  Musculoskeletal Exam: ***  CDAI Exam: CDAI Score: - Patient Global: -; Provider Global: - Swollen: -; Tender: - Joint Exam   No joint exam has been documented for this visit   There is currently no information documented on the homunculus. Go to the Rheumatology activity and complete the homunculus joint exam.  Investigation: No additional findings.  Imaging: No results found.  Recent Labs: Lab Results  Component Value Date   WBC 6.3 02/17/2019   HGB 14.0 02/17/2019   PLT 158.0 02/17/2019   NA 140 02/17/2019   K 4.0 02/17/2019   CL 103 02/17/2019   CO2 30 02/17/2019   GLUCOSE 89 02/17/2019   BUN 14 02/17/2019   CREATININE 0.81 02/17/2019   BILITOT 0.4 02/17/2019   ALKPHOS 45 02/17/2019   AST 16 02/17/2019   ALT 12 02/17/2019   PROT 6.2 02/17/2019   ALBUMIN 4.0 02/17/2019   CALCIUM 9.1 02/17/2019   GFRAA >60 11/02/2018   QFTBGOLDPLUS NEGATIVE 07/06/2018    Speciality Comments: No specialty comments available.  Procedures:  No procedures performed Allergies: Other, Silodosin, Ace inhibitors, Buprenorphine hcl, Morphine and related, Codeine, Sulfonamide derivatives, and Telmisartan   Assessment / Plan:     Visit Diagnoses: No  diagnosis found.  Orders: No orders of the defined types were placed in this encounter.  No orders of the defined types were placed in this encounter.   Face-to-face time spent with patient was *** minutes. Greater than 50% of time was spent in counseling and coordination of care.  Follow-Up Instructions: No follow-ups on file.   Earnestine Mealing, CMA  Note - This record has been created using Editor, commissioning.  Chart creation errors have been sought, but may not always  have been located. Such creation errors do not reflect on  the standard of medical care.

## 2019-04-05 ENCOUNTER — Ambulatory Visit: Payer: Medicare HMO | Admitting: Rheumatology

## 2019-04-15 DIAGNOSIS — H52223 Regular astigmatism, bilateral: Secondary | ICD-10-CM | POA: Diagnosis not present

## 2019-05-04 DIAGNOSIS — M4726 Other spondylosis with radiculopathy, lumbar region: Secondary | ICD-10-CM | POA: Diagnosis not present

## 2019-05-04 DIAGNOSIS — M4126 Other idiopathic scoliosis, lumbar region: Secondary | ICD-10-CM | POA: Diagnosis not present

## 2019-05-04 DIAGNOSIS — M47896 Other spondylosis, lumbar region: Secondary | ICD-10-CM | POA: Diagnosis not present

## 2019-05-04 DIAGNOSIS — M4326 Fusion of spine, lumbar region: Secondary | ICD-10-CM | POA: Diagnosis not present

## 2019-05-13 ENCOUNTER — Other Ambulatory Visit: Payer: Self-pay | Admitting: Oncology

## 2019-06-03 DIAGNOSIS — M47896 Other spondylosis, lumbar region: Secondary | ICD-10-CM | POA: Diagnosis not present

## 2019-06-03 DIAGNOSIS — M4726 Other spondylosis with radiculopathy, lumbar region: Secondary | ICD-10-CM | POA: Diagnosis not present

## 2019-06-03 DIAGNOSIS — M4326 Fusion of spine, lumbar region: Secondary | ICD-10-CM | POA: Diagnosis not present

## 2019-06-03 DIAGNOSIS — M4126 Other idiopathic scoliosis, lumbar region: Secondary | ICD-10-CM | POA: Diagnosis not present

## 2019-06-16 DIAGNOSIS — M4126 Other idiopathic scoliosis, lumbar region: Secondary | ICD-10-CM | POA: Diagnosis not present

## 2019-06-16 DIAGNOSIS — M545 Low back pain: Secondary | ICD-10-CM | POA: Diagnosis not present

## 2019-06-24 DIAGNOSIS — M4726 Other spondylosis with radiculopathy, lumbar region: Secondary | ICD-10-CM | POA: Diagnosis not present

## 2019-06-24 DIAGNOSIS — M8008XA Age-related osteoporosis with current pathological fracture, vertebra(e), initial encounter for fracture: Secondary | ICD-10-CM | POA: Diagnosis not present

## 2019-06-24 DIAGNOSIS — M4326 Fusion of spine, lumbar region: Secondary | ICD-10-CM | POA: Diagnosis not present

## 2019-06-28 DIAGNOSIS — Z01419 Encounter for gynecological examination (general) (routine) without abnormal findings: Secondary | ICD-10-CM | POA: Diagnosis not present

## 2019-06-28 DIAGNOSIS — L900 Lichen sclerosus et atrophicus: Secondary | ICD-10-CM | POA: Diagnosis not present

## 2019-06-28 DIAGNOSIS — N898 Other specified noninflammatory disorders of vagina: Secondary | ICD-10-CM | POA: Diagnosis not present

## 2019-07-10 ENCOUNTER — Other Ambulatory Visit: Payer: Self-pay | Admitting: Physician Assistant

## 2019-07-12 NOTE — Telephone Encounter (Signed)
Last OV was Ray on 02/17/2019. Labs stable at that time. Refill sent. Is due for 31-month follow-up between March and April. Please notify patient.

## 2019-07-12 NOTE — Telephone Encounter (Signed)
Requesting Refill  Rx Furosemide 40mg  #90 -0 Ref Last Refill on 02/22/2019 Last OV 05/25/2018 for Soft Tissue Mass  Please Advised

## 2019-07-14 NOTE — Telephone Encounter (Signed)
Pt aware. Appointment Schedule for April 1,2021

## 2019-07-22 DIAGNOSIS — M8008XA Age-related osteoporosis with current pathological fracture, vertebra(e), initial encounter for fracture: Secondary | ICD-10-CM | POA: Diagnosis not present

## 2019-07-22 DIAGNOSIS — M4326 Fusion of spine, lumbar region: Secondary | ICD-10-CM | POA: Diagnosis not present

## 2019-07-27 DIAGNOSIS — R69 Illness, unspecified: Secondary | ICD-10-CM | POA: Diagnosis not present

## 2019-07-30 DIAGNOSIS — M8008XA Age-related osteoporosis with current pathological fracture, vertebra(e), initial encounter for fracture: Secondary | ICD-10-CM | POA: Diagnosis not present

## 2019-08-19 DIAGNOSIS — M8008XD Age-related osteoporosis with current pathological fracture, vertebra(e), subsequent encounter for fracture with routine healing: Secondary | ICD-10-CM | POA: Diagnosis not present

## 2019-08-19 DIAGNOSIS — M4722 Other spondylosis with radiculopathy, cervical region: Secondary | ICD-10-CM | POA: Diagnosis not present

## 2019-08-26 ENCOUNTER — Telehealth (INDEPENDENT_AMBULATORY_CARE_PROVIDER_SITE_OTHER): Payer: Medicare HMO | Admitting: Physician Assistant

## 2019-08-26 ENCOUNTER — Other Ambulatory Visit: Payer: Self-pay

## 2019-08-26 ENCOUNTER — Encounter: Payer: Self-pay | Admitting: Physician Assistant

## 2019-08-26 VITALS — BP 136/84 | HR 77 | Wt 189.2 lb

## 2019-08-26 DIAGNOSIS — R69 Illness, unspecified: Secondary | ICD-10-CM | POA: Diagnosis not present

## 2019-08-26 DIAGNOSIS — F339 Major depressive disorder, recurrent, unspecified: Secondary | ICD-10-CM

## 2019-08-26 MED ORDER — CITALOPRAM HYDROBROMIDE 20 MG PO TABS: 20.0000 mg | ORAL_TABLET | Freq: Every day | ORAL | 1 refills | Status: DC

## 2019-08-26 NOTE — Progress Notes (Signed)
I have discussed the procedure for the virtual visit with the patient who has given consent to proceed with assessment and treatment.   Zaevion Parke S Merridith Dershem, CMA     

## 2019-08-26 NOTE — Progress Notes (Signed)
Virtual Visit via Video   I connected with patient on 08/26/19 at  8:00 AM EDT by a video enabled telemedicine application and verified that I am speaking with the correct person using two identifiers.  Location patient: Home Location provider: [facility], Office Persons participating in the virtual visit: Patient, Provider, [place] ([name])  I discussed the limitations of evaluation and management by telemedicine and the availability of in person appointments. The patient expressed understanding and agreed to proceed.  Subjective:   HPI:   Patient presents via [name] today to follow-up regarding MDD. Patient is currently on a regimen of Citalopram 20 mg daily. Endorses taking medications as directed. Notes mood is doing very well overall. Is keeping herself active at home.   Patient recently had spinal fusion. Is doing very well s/p procedure. Has been having some neck issue that she is finally able to look into. Is scheduled for MRI of the neck.   ROS:   See pertinent positives and negatives per HPI.  Patient Active Problem List   Diagnosis Date Noted  . Rheumatoid arthritis ([place]) 11/02/2018  . Episode of recurrent major depressive disorder ([place]) 12/05/2017  . Primary osteoarthritis of both hands 07/09/2017  . Primary osteoarthritis of both knees 07/09/2017  . DDD (degenerative disc disease), cervical 07/09/2017  . DDD (degenerative disc disease), lumbar 07/09/2017  . Peripheral edema 11/26/2016  . Medicare annual wellness visit, subsequent 06/30/2016  . Hx of adenomatous polyp of colon 07/03/2015  . 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 Family history of breast cancer   . Family history of colon cancer   . Family history of pancreatic cancer   . Malignant neoplasm of upper-outer quadrant of left breast in female, estrogen receptor positive ([place]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  . Peripheral neuropathy 01/01/2013  . Melanoma of upper arm ([place]) 02/10/2012  . S/P  laparoscopic fundoplication [id number]  . FATTY LIVER DISEASE 12/07/2009  . COAGULOPATHY 07/12/2008  . CHEST XRAY, ABNORMAL 01/04/2008  . Vitamin D deficiency 04/14/2007  . VITAMIN B12 DEFICIENCY 02/27/2007  . Essential hypertension 02/27/2007  . GERD 02/27/2007  . Fibromyalgia 02/27/2007    Social History   Tobacco Use  . Smoking status: Never Smoker  . Smokeless tobacco: Never Used  . Tobacco comment: Regular Exercise - Yes  Substance Use Topics  . Alcohol use: No    Current Outpatient Medications:  .  cetirizine (ZYRTEC ALLERGY) 10 MG tablet, Take 10 mg by mouth as needed. , Disp: , Rfl:  .  Cholecalciferol (VITAMIN D3) 5000 units TABS, Take by mouth daily., Disp: , Rfl:  .  citalopram (CELEXA) 20 MG tablet, Take 1 tablet (20 mg total) by mouth daily., Disp: 90 tablet, Rfl: 1 .  cyanocobalamin 2000 MCG tablet, Take 5,000 mcg by mouth daily. , Disp: , Rfl:  .  folic acid (FOLVITE) 1 MG tablet, Take 2 tablets (2 mg total) by mouth daily., Disp: 180 tablet, Rfl: 3 .  furosemide (LASIX) 40 MG tablet, Take 1 tablet by mouth once daily, Disp: 90 tablet, Rfl: 0 .  naproxen sodium (ALEVE) 220 MG tablet, Take 220 mg by mouth as needed., Disp: , Rfl:  .  Polyethylene Glycol 3350 (MIRALAX PO), Take by mouth as needed., Disp: , Rfl:  .  Red Yeast Rice 600 MG CAPS, Take 1 capsule (600 mg total) by mouth daily., Disp: 90 capsule, Rfl: 0 .  tamoxifen (NOLVADEX) 20 MG tablet, Take 1 tablet by mouth once daily, Disp: 90 tablet, Rfl: 0 .  vitamin E 400 UNIT capsule, Take 400 Units by mouth daily., Disp: , Rfl:   Allergies  Allergen Reactions  . Other Anaphylaxis and Rash  . Silodosin Rash    Facial rash  . Ace Inhibitors Other (See Comments)    REACTION: angioedema  . Buprenorphine Hcl Other (See Comments)    "crazy"  . Morphine And Related Other (See Comments)    "crazy" "crazy"  . Codeine Rash  . Sulfonamide Derivatives Rash  . Telmisartan Rash    Objective:   There were no  vitals taken for this visit.  Patient is well-developed, well-nourished in no acute distress.  Resting comfortably at home.  Head is normocephalic, atraumatic.  No labored breathing.  Speech is clear and coherent with logical content.  Patient is alert and oriented at baseline.   Assessment and Plan:   1. Episode of recurrent major depressive disorder, unspecified depression episode severity ([place]) Doing very well. Continue current regimen. Medications refilled. Follow-up 6 months for MWV/CPE .   Leeanne Rio, PA-C 08/26/2019

## 2019-09-03 DIAGNOSIS — Z85820 Personal history of malignant melanoma of skin: Secondary | ICD-10-CM | POA: Diagnosis not present

## 2019-09-03 DIAGNOSIS — L814 Other melanin hyperpigmentation: Secondary | ICD-10-CM | POA: Diagnosis not present

## 2019-09-03 DIAGNOSIS — D225 Melanocytic nevi of trunk: Secondary | ICD-10-CM | POA: Diagnosis not present

## 2019-09-03 DIAGNOSIS — L821 Other seborrheic keratosis: Secondary | ICD-10-CM | POA: Diagnosis not present

## 2019-09-03 DIAGNOSIS — D692 Other nonthrombocytopenic purpura: Secondary | ICD-10-CM | POA: Diagnosis not present

## 2019-09-03 DIAGNOSIS — L570 Actinic keratosis: Secondary | ICD-10-CM | POA: Diagnosis not present

## 2019-09-03 DIAGNOSIS — D0461 Carcinoma in situ of skin of right upper limb, including shoulder: Secondary | ICD-10-CM | POA: Diagnosis not present

## 2019-09-03 DIAGNOSIS — L308 Other specified dermatitis: Secondary | ICD-10-CM | POA: Diagnosis not present

## 2019-09-03 DIAGNOSIS — D0462 Carcinoma in situ of skin of left upper limb, including shoulder: Secondary | ICD-10-CM | POA: Diagnosis not present

## 2019-09-06 ENCOUNTER — Telehealth: Payer: Self-pay | Admitting: Physician Assistant

## 2019-09-06 NOTE — Progress Notes (Signed)
  Chronic Care Management   Outreach Note  09/06/2019 Name: [name] MRN: [id number] DOB: August 10, 1945  Referred by: Brunetta Jeans, PA-C Reason for referral : No chief complaint on file.   An unsuccessful telephone outreach was attempted today. The patient was referred to the pharmacist for assistance with care management and care coordination.   Follow Up Plan:   Earney Hamburg Upstream Scheduler

## 2019-09-06 NOTE — Progress Notes (Signed)
  Chronic Care Management   Note  09/06/2019 Name: EVANTHIA CARTELLI MRN: [id number] DOB: 28-Jul-1945  BASHA HARSHMAN is a 74 y.o. year old female who is a primary care patient of Delorse Limber. I reached out to [name] by phone today in response to a referral sent by Ms. Zadie Cleverly Friday's PCP, Brunetta Jeans, PA-C.   Ms. Faden was given information about Chronic Care Management services today including:  1. CCM service includes personalized support from designated clinical staff supervised by her physician, including individualized plan of care and coordination with other care providers 2. 24/7 contact phone numbers for assistance for urgent and routine care needs. 3. Service will only be billed when office clinical staff spend 20 minutes or more in a month to coordinate care. 4. Only one practitioner may furnish and bill the service in a calendar month. 5. The patient may stop CCM services at any time (effective at the end of the month) by phone call to the office staff.   Patient agreed to services and verbal consent obtained.   Follow up plan:   Earney Hamburg Upstream Scheduler

## 2019-09-14 DIAGNOSIS — G5611 Other lesions of median nerve, right upper limb: Secondary | ICD-10-CM | POA: Diagnosis not present

## 2019-09-14 DIAGNOSIS — M542 Cervicalgia: Secondary | ICD-10-CM | POA: Diagnosis not present

## 2019-09-17 ENCOUNTER — Other Ambulatory Visit: Payer: Self-pay | Admitting: General Practice

## 2019-09-17 DIAGNOSIS — I10 Essential (primary) hypertension: Secondary | ICD-10-CM

## 2019-09-17 DIAGNOSIS — E559 Vitamin D deficiency, unspecified: Secondary | ICD-10-CM

## 2019-09-17 DIAGNOSIS — F339 Major depressive disorder, recurrent, unspecified: Secondary | ICD-10-CM

## 2019-09-28 ENCOUNTER — Ambulatory Visit: Payer: Medicare HMO

## 2019-09-28 VITALS — BP 147/76

## 2019-09-28 DIAGNOSIS — M159 Polyosteoarthritis, unspecified: Secondary | ICD-10-CM

## 2019-09-28 DIAGNOSIS — E7800 Pure hypercholesterolemia, unspecified: Secondary | ICD-10-CM

## 2019-09-28 DIAGNOSIS — G5611 Other lesions of median nerve, right upper limb: Secondary | ICD-10-CM | POA: Diagnosis not present

## 2019-09-28 DIAGNOSIS — I10 Essential (primary) hypertension: Secondary | ICD-10-CM

## 2019-09-28 DIAGNOSIS — M542 Cervicalgia: Secondary | ICD-10-CM | POA: Diagnosis not present

## 2019-09-28 NOTE — Patient Instructions (Signed)
Visit Information  Goals Addressed            This Visit's Progress   . BP <140/90       CARE PLAN ENTRY Current Barriers:  . Current blood pressure regimen: o Furosemide 40 mg once daily for fluid retention  BP Readings from Last 3 Encounters:  09/28/19 (!) 147/76  08/26/19 136/84  02/17/19 118/78 .  Current home BP readings:  o 5/4: 152/81,147/76 on repeat.  Pharmacist Clinical Goal(s):  Marland Kitchen Over the next 180 days, patient will work with PharmD and providers to optimize antihypertensive regimen o BP <140/90 goal Interventions: . Inter-disciplinary care team collaboration (see longitudinal plan of care) . Comprehensive medication review performed; medication list updated in the electronic medical record.  . Implement blood pressure monitoring plan.  Patient Self Care Activities:  . Patient will check BP multiple times throughout the day, document, and provide at future appointments. . Patient will focus on medication adherence by continuing current medication management.  Initial goal documentation.     . LDL Cholesterol <100       CARE PLAN ENTRY Current Barriers:  . Current antihyperlipidemic regimen: diet/exercise. Marland Kitchen Previous antihyperlipidemic medications tried o Atorvastatin 10 mg daily . Most recent lipid panel:     Component Value Date/Time   CHOL 197 02/17/2019 1015   TRIG 116.0 02/17/2019 1015   HDL 40.50 02/17/2019 1015   CHOLHDL 5 02/17/2019 1015   VLDL 23.2 02/17/2019 1015   LDLCALC 133 (H) 02/17/2019 1015   LDLDIRECT 179.7 02/10/2012 0920  Pharmacist Clinical Goal(s):  Marland Kitchen Over the next 180 days, patient will work with PharmD and providers towards optimized antihyperlipidemic therapy Interventions: . Comprehensive medication review performed; medication list updated in electronic medical record.  [name] care team collaboration  Patient Self Care Activities:  . Patient will focus on medication adherence by continuing current management and  going for routine labs.  Initial goal documentation.    . Pain: Minimize recurring symptoms       CARE PLAN ENTRY Current Barriers:  . Chronic Disease Management support, education, and care coordination needs related to pain/arthritis.  Pharmacist Clinical Goal(s):  Marland Kitchen Over next 180 days: Minimize recurring pain.  Interventions: . Comprehensive medication review performed. . Stop naproxen, start tylenol extra strength 500 mg two tabs every 8 hours.  Patient Self Care Activities:  . Patient verbalizes understanding of plan to stop naproxen, start tylenol extra strength 500 mg two tabs every 8 hours over next 180 days.  . Please call with any questions!  Initial goal documentation.      [name] was given information about Chronic Care Management services today including:  1. CCM service includes personalized support from designated clinical staff supervised by her physician, including individualized plan of care and coordination with other care providers 2. 24/7 contact phone numbers for assistance for urgent and routine care needs. 3. Standard insurance, coinsurance, copays and deductibles apply for chronic care management only during months in which we provide at least 20 minutes of these services. Most insurances cover these services at 100%, however patients may be responsible for any copay, coinsurance and/or deductible if applicable. This service may help you avoid the need for more expensive face-to-face services. 4. Only one practitioner may furnish and bill the service in a calendar month. 5. The patient may stop CCM services at any time (effective at the end of the month) by phone call to the office staff.  Patient agreed to services and verbal  consent obtained.   The patient verbalized understanding of instructions provided today and agreed to receive a mailed copy of patient instruction and/or educational materials. Telephone follow up appointment with pharmacy team member  scheduled for: See next appointment with "Care Management Staff" under "What's Next" below.   Thank you!  Madelin Rear, Pharm.D. Clinical Pharmacist [facility] [phone]  Hypertension, Adult High blood pressure (hypertension) is when the force of blood pumping through the arteries is too strong. The arteries are the blood vessels that carry blood from the heart throughout the body. Hypertension forces the heart to work harder to pump blood and may cause arteries to become narrow or stiff. Untreated or uncontrolled hypertension can cause a heart attack, heart failure, a stroke, kidney disease, and other problems. A blood pressure reading consists of a higher number over a lower number. Ideally, your blood pressure should be below 120/80. The first ("top") number is called the systolic pressure. It is a measure of the pressure in your arteries as your heart beats. The second ("bottom") number is called the diastolic pressure. It is a measure of the pressure in your arteries as the heart relaxes. What are the causes? The exact cause of this condition is not known. There are some conditions that result in or are related to high blood pressure. What increases the risk? Some risk factors for high blood pressure are under your control. The following factors may make you more likely to develop this condition:  Smoking.  Having type 2 diabetes mellitus, high cholesterol, or both.  Not getting enough exercise or physical activity.  Being overweight.  Having too much fat, sugar, calories, or salt (sodium) in your diet.  Drinking too much alcohol. Some risk factors for high blood pressure may be difficult or impossible to change. Some of these factors include:  Having chronic kidney disease.  Having a family history of high blood pressure.  Age. Risk increases with age.  Race. You may be at higher risk if you are African American.  Gender. Men are at  higher risk than women before age 44. After age 37, women are at higher risk than men.  Having obstructive sleep apnea.  Stress. What are the signs or symptoms? High blood pressure may not cause symptoms. Very high blood pressure (hypertensive crisis) may cause:  Headache.  Anxiety.  Shortness of breath.  Nosebleed.  Nausea and vomiting.  Vision changes.  Severe chest pain.  Seizures. How is this diagnosed? This condition is diagnosed by measuring your blood pressure while you are seated, with your arm resting on a flat surface, your legs uncrossed, and your feet flat on the floor. The cuff of the blood pressure monitor will be placed directly against the skin of your upper arm at the level of your heart. It should be measured at least twice using the same arm. Certain conditions can cause a difference in blood pressure between your right and left arms. Certain factors can cause blood pressure readings to be lower or higher than normal for a short period of time:  When your blood pressure is higher when you are in a health care provider's office than when you are at home, this is called white coat hypertension. Most people with this condition do not need medicines.  When your blood pressure is higher at home than when you are in a health care provider's office, this is called masked hypertension. Most people with this condition may need medicines to  control blood pressure. If you have a high blood pressure reading during one visit or you have normal blood pressure with other risk factors, you may be asked to:  Return on a different day to have your blood pressure checked again.  Monitor your blood pressure at home for 1 week or longer. If you are diagnosed with hypertension, you may have other blood or imaging tests to help your health care provider understand your overall risk for other conditions. How is this treated? This condition is treated by making healthy lifestyle  changes, such as eating healthy foods, exercising more, and reducing your alcohol intake. Your health care provider may prescribe medicine if lifestyle changes are not enough to get your blood pressure under control, and if:  Your systolic blood pressure is above 130.  Your diastolic blood pressure is above 80. Your personal target blood pressure may vary depending on your medical conditions, your age, and other factors. Follow these instructions at home: Eating and drinking   Eat a diet that is high in fiber and potassium, and low in sodium, added sugar, and fat. An example eating plan is called the DASH (Dietary Approaches to Stop Hypertension) diet. To eat this way: ? Eat plenty of fresh fruits and vegetables. Try to fill one half of your plate at each meal with fruits and vegetables. ? Eat whole grains, such as whole-wheat pasta, brown rice, or whole-grain bread. Fill about one fourth of your plate with whole grains. ? Eat or drink low-fat dairy products, such as skim milk or low-fat yogurt. ? Avoid fatty cuts of meat, processed or cured meats, and poultry with skin. Fill about one fourth of your plate with lean proteins, such as fish, chicken without skin, beans, eggs, or tofu. ? Avoid pre-made and processed foods. These tend to be higher in sodium, added sugar, and fat.  Reduce your daily sodium intake. Most people with hypertension should eat less than 1,500 mg of sodium a day.  Do not drink alcohol if: ? Your health care provider tells you not to drink. ? You are pregnant, may be pregnant, or are planning to become pregnant.  If you drink alcohol: ? Limit how much you use to:  0-1 drink a day for women.  0-2 drinks a day for men. ? Be aware of how much alcohol is in your drink. In the [place] one drink equals one 12 oz bottle of beer (355 mL), one 5 oz glass of wine (148 mL), or one 1 oz glass of hard liquor (44 mL). Lifestyle   Work with your health care provider to maintain  a healthy body weight or to lose weight. Ask what an ideal weight is for you.  Get at least 30 minutes of exercise most days of the week. Activities may include walking, swimming, or biking.  Include exercise to strengthen your muscles (resistance exercise), such as Pilates or lifting weights, as part of your weekly exercise routine. Try to do these types of exercises for 30 minutes at least 3 days a week.  Do not use any products that contain nicotine or tobacco, such as cigarettes, e-cigarettes, and chewing tobacco. If you need help quitting, ask your health care provider.  Monitor your blood pressure at home as told by your health care provider.  Keep all follow-up visits as told by your health care provider. This is important. Medicines  Take over-the-counter and prescription medicines only as told by your health care provider. Follow directions carefully. Blood pressure  medicines must be taken as prescribed.  Do not skip doses of blood pressure medicine. Doing this puts you at risk for problems and can make the medicine less effective.  Ask your health care provider about side effects or reactions to medicines that you should watch for. Contact a health care provider if you:  Think you are having a reaction to a medicine you are taking.  Have headaches that keep coming back (recurring).  Feel dizzy.  Have swelling in your ankles.  Have trouble with your vision. Get help right away if you:  Develop a severe headache or confusion.  Have unusual weakness or numbness.  Feel faint.  Have severe pain in your chest or abdomen.  Vomit repeatedly.  Have trouble breathing. Summary  Hypertension is when the force of blood pumping through your arteries is too strong. If this condition is not controlled, it may put you at risk for serious complications.  Your personal target blood pressure may vary depending on your medical conditions, your age, and other factors. For most  people, a normal blood pressure is less than 120/80.  Hypertension is treated with lifestyle changes, medicines, or a combination of both. Lifestyle changes include losing weight, eating a healthy, low-sodium diet, exercising more, and limiting alcohol. This information is not intended to replace advice given to you by your health care provider. Make sure you discuss any questions you have with your health care provider. Document Revised: 01/21/2018 Document Reviewed: 01/21/2018 Elsevier Patient Education  2020 [employer].

## 2019-09-28 NOTE — Progress Notes (Signed)
Chronic Care Management Pharmacy  Name: [name]  MRN: [id number] DOB: [birth year]  Chief Complaint/ HPI [name],  74 y.o. , female presents for their Initial CCM visit with the clinical pharmacist via telephone due to COVID-19 Pandemic.  PCP : [name] Jeans, PA-C.  Their chronic conditions include: HTN, HLD, GERD, chronic back pain, depression.  Office Visits:  4/1 (PCP): Notes mood doing very well. Recent spinal fusion, neck pain. Ongoing PT.   Outpatient Encounter Medications as of 09/28/2019  Medication Sig  . Acetaminophen (TYLENOL EXTRA STRENGTH PO) Take 1,000 mg by mouth every 8 (eight) hours as needed.  . Biotin 10000 MCG TABS Take 10,000 mcg by mouth daily.  . cetirizine (ZYRTEC ALLERGY) 10 MG tablet Take 10 mg by mouth as needed.   . Cholecalciferol (VITAMIN D3 PO) Take 6,000 Units by mouth daily.   . citalopram (CELEXA) 20 MG tablet Take 1 tablet (20 mg total) by mouth daily.  . cyanocobalamin 2000 MCG tablet Take 2,000 mcg by mouth daily.   . furosemide (LASIX) 40 MG tablet Take 1 tablet by mouth once daily  . naproxen sodium (ALEVE) 220 MG tablet Take 220 mg by mouth as needed.  . Polyethylene Glycol 3350 (MIRALAX PO) Take by mouth as needed.  . Red Yeast Rice 600 MG CAPS Take 1 capsule (600 mg total) by mouth daily.  . tamoxifen (NOLVADEX) 20 MG tablet Take 1 tablet by mouth once daily  . vitamin E 400 UNIT capsule Take 400 Units by mouth daily. 800 mg daily  . zinc gluconate 50 MG tablet Take 50 mg by mouth daily.  . folic acid (FOLVITE) 1 MG tablet Take 2 tablets (2 mg total) by mouth daily. (Patient not taking: Reported on 09/28/2019)   No facility-administered encounter medications on file as of 09/28/2019.   Current Diagnosis/Assessment: Goals Addressed            This Visit's Progress   . BP <140/90       CARE PLAN ENTRY Current Barriers:  . Current blood pressure regimen: o Furosemide 40 mg once daily for fluid retention  BP Readings from  Last 3 Encounters:  09/28/19 (!) 147/76  08/26/19 136/84  02/17/19 118/78 .  Current home BP readings:  o 5/4: 152/81,147/76 on repeat.  Pharmacist Clinical Goal(s):  Marland Kitchen Over the next 180 days, patient will work with PharmD and providers to optimize antihypertensive regimen o BP <140/90 goal Interventions: . Inter-disciplinary care team collaboration (see longitudinal plan of care) . Comprehensive medication review performed; medication list updated in the electronic medical record.  . Implement blood pressure monitoring plan.  Patient Self Care Activities:  . Patient will check BP multiple times throughout the day, document, and provide at future appointments. . Patient will focus on medication adherence by continuing current medication management.  Initial goal documentation.     . LDL Cholesterol <100       CARE PLAN ENTRY Current Barriers:  . Current antihyperlipidemic regimen: diet/exercise. Marland Kitchen Previous antihyperlipidemic medications tried o Atorvastatin 10 mg daily . Most recent lipid panel:     Component Value Date/Time   CHOL 197 02/17/2019 1015   TRIG 116.0 02/17/2019 1015   HDL 40.50 02/17/2019 1015   CHOLHDL 5 02/17/2019 1015   VLDL 23.2 02/17/2019 1015   LDLCALC 133 (H) 02/17/2019 1015   LDLDIRECT 179.7 02/10/2012 0920  Pharmacist Clinical Goal(s):  Marland Kitchen Over the next 180 days, patient will work with PharmD and providers towards optimized  antihyperlipidemic therapy Interventions: . Comprehensive medication review performed; medication list updated in electronic medical record.  [name] care team collaboration  Patient Self Care Activities:  . Patient will focus on medication adherence by continuing current management and going for routine labs.  Initial goal documentation.    . Pain: Minimize recurring symptoms       CARE PLAN ENTRY Current Barriers:  . Chronic Disease Management support, education, and care coordination needs related to  pain/arthritis.  Pharmacist Clinical Goal(s):  Marland Kitchen Over next 180 days: Minimize recurring pain.  Interventions: . Comprehensive medication review performed. . Stop naproxen, start tylenol extra strength 500 mg two tabs every 8 hours.  Patient Self Care Activities:  . Patient verbalizes understanding of plan to stop naproxen, start tylenol extra strength 500 mg two tabs every 8 hours over next 180 days.  . Please call with any questions!  Initial goal documentation.      Hypertension   BP Readings from Last 3 Encounters:  09/28/19 (!) 147/76  08/26/19 136/84  02/17/19 118/78   Lab Results  Component Value Date   CREATININE 0.81 02/17/2019   CREATININE 0.95 11/02/2018   CREATININE 0.89 07/27/2018   Patient is currently controlled on the following medications:   Furosemide 40 mg daily   Mentions body retaining fluids, historically has not had any issues with blood pressure. Is elevated at home today 152/81,147/76 on repeat.    Patient checks BP at home infrequently. Today we discussed monitoring daily at different times and keeping a log for the next 2 weeks. Denies dizziness, SOB, chest pain.  Plan Continue current medications. Follow-up via telephone for bp readings in 2 weeks.   Hyperlipidemia   Lipid Panel     Component Value Date/Time   CHOL 197 02/17/2019 1015   TRIG 116.0 02/17/2019 1015   HDL 40.50 02/17/2019 1015   CHOLHDL 5 02/17/2019 1015   VLDL 23.2 02/17/2019 1015   LDLCALC 133 (H) 02/17/2019 1015   LDLDIRECT 179.7 02/10/2012 0920    The 10-year ASCVD risk score Mikey Bussing DC Jr., et al., 2013) is: 22.4%   Values used to calculate the score:     Age: 73 years     Sex: Female     Is Non-Hispanic African American: No     Diabetic: No     Tobacco smoker: No     Systolic Blood Pressure: [id number] mmHg     Is BP treated: Yes     HDL Cholesterol: 40.5 mg/dL     Total Cholesterol: 197 mg/dL   LDL high at last visit. Patient has failed these meds in past:  atorvastatin 10 mg.  Patient is currently controlled on the following medications: n/a.   We discussed most recent elevated LDL at last lab. Currently not taking anything for cholesterol. Mentions that she was previously on red yeast rice for GI benefits. Hx of use with atorvastatin 10 mg.   Plan Continue control with diet and exercise. Will continue to monitor.   GERD  Currently controlled without medications. No issues at this time.    Patient denies dysphagia, heartburn or nausea.   Plan  Continue control with diet.   Depression  Patient is currently controlled on citalopram  20 mg daily. Denies any side effects at this time. Reports continued benefit with current regimen.  Plan Continue current medication.   Pain/Osteoarthritis  Patient is currently controlled on the following medications: naproxen 220 mg. We discussed alternatives for arthritis related pain, including tylenol extra  strength and Voltaren topical gel. Is participating in physical therapy, expresses difficulty with streches.   Plan Stop naproxen 220 mg daily, start trial with Tylenol [phone] mg every 8 hours and topical Voltaren gel as needed.   Vaccines  Reviewed patient's vaccination history.  Vaccines are up to date.  Immunization History  Administered Date(s) Administered  . Fluad Quad(high Dose 65+) 02/17/2019  . Influenza Split 02/06/2011, 02/10/2012  . Influenza Whole 04/14/2007  . Influenza, High Dose Seasonal PF 04/06/2015, 03/18/2017, 01/29/2018  . Influenza,inj,Quad PF,6+ Mos 07/29/2014, 02/20/2016  . Pneumococcal Conjugate-13 02/20/2016  . Pneumococcal Polysaccharide-23 07/29/2014  . Tdap 01/03/2013  . Tetanus 01/04/2008  . Zoster Recombinat (Shingrix) 02/14/2018, 06/02/2018   Plan No recommendation at this time.  Medication Management  Pt uses [facility] for all medications. Uses pill box? Yes.  Plan Continue current medication management strategy.  Follow up: 3 month phone  visit ______________ Visit Information SDOH (Social Determinants of Health) assessments performed:  Yes.   Food Insecurity: No Food Insecurity  . Worried About Charity fundraiser in the Last Year: Never true  . Ran Out of Food in the Last Year: Never true     Transportation Needs: No Transportation Needs  . Lack of Transportation (Medical): No  . Lack of Transportation (Non-Medical): No    Ms. Clontz was given information about Chronic Care Management services today including:  1. CCM service includes personalized support from designated clinical staff supervised by her physician, including individualized plan of care and coordination with other care providers 2. 24/7 contact phone numbers for assistance for urgent and routine care needs. 3. Standard insurance, coinsurance, copays and deductibles apply for chronic care management only during months in which we provide at least 20 minutes of these services. Most insurances cover these services at 100%, however patients may be responsible for any copay, coinsurance and/or deductible if applicable. This service may help you avoid the need for more expensive face-to-face services. 4. Only one practitioner may furnish and bill the service in a calendar month. 5. The patient may stop CCM services at any time (effective at the end of the month) by phone call to the office staff.  Patient agreed to services and verbal consent obtained.   Madelin Rear, Pharm.D. Clinical Pharmacist [facility] at [facility] [phone]

## 2019-09-30 DIAGNOSIS — M542 Cervicalgia: Secondary | ICD-10-CM | POA: Diagnosis not present

## 2019-09-30 DIAGNOSIS — M4722 Other spondylosis with radiculopathy, cervical region: Secondary | ICD-10-CM | POA: Diagnosis not present

## 2019-09-30 DIAGNOSIS — G5611 Other lesions of median nerve, right upper limb: Secondary | ICD-10-CM | POA: Diagnosis not present

## 2019-10-05 DIAGNOSIS — C50912 Malignant neoplasm of unspecified site of left female breast: Secondary | ICD-10-CM | POA: Diagnosis not present

## 2019-10-20 DIAGNOSIS — M542 Cervicalgia: Secondary | ICD-10-CM | POA: Diagnosis not present

## 2019-10-20 DIAGNOSIS — M47812 Spondylosis without myelopathy or radiculopathy, cervical region: Secondary | ICD-10-CM | POA: Diagnosis not present

## 2019-10-20 DIAGNOSIS — M4722 Other spondylosis with radiculopathy, cervical region: Secondary | ICD-10-CM | POA: Diagnosis not present

## 2019-10-21 DIAGNOSIS — M4722 Other spondylosis with radiculopathy, cervical region: Secondary | ICD-10-CM | POA: Diagnosis not present

## 2019-10-21 DIAGNOSIS — M5412 Radiculopathy, cervical region: Secondary | ICD-10-CM | POA: Diagnosis not present

## 2019-11-01 DIAGNOSIS — M50323 Other cervical disc degeneration at C6-C7 level: Secondary | ICD-10-CM | POA: Diagnosis not present

## 2019-11-12 DIAGNOSIS — M5481 Occipital neuralgia: Secondary | ICD-10-CM | POA: Diagnosis not present

## 2019-11-12 DIAGNOSIS — M4722 Other spondylosis with radiculopathy, cervical region: Secondary | ICD-10-CM | POA: Diagnosis not present

## 2019-11-12 DIAGNOSIS — M50323 Other cervical disc degeneration at C6-C7 level: Secondary | ICD-10-CM | POA: Diagnosis not present

## 2019-11-16 ENCOUNTER — Other Ambulatory Visit: Payer: Self-pay | Admitting: Oncology

## 2019-11-17 ENCOUNTER — Telehealth: Payer: Self-pay | Admitting: Oncology

## 2019-11-17 NOTE — Telephone Encounter (Signed)
Scheduled per 6/22 sch message. Pt is aware of appt time and date.

## 2019-12-14 DIAGNOSIS — Z0100 Encounter for examination of eyes and vision without abnormal findings: Secondary | ICD-10-CM | POA: Diagnosis not present

## 2020-01-03 ENCOUNTER — Telehealth: Payer: Medicare HMO

## 2020-01-06 ENCOUNTER — Ambulatory Visit: Payer: Medicare HMO

## 2020-01-06 DIAGNOSIS — K219 Gastro-esophageal reflux disease without esophagitis: Secondary | ICD-10-CM

## 2020-01-06 DIAGNOSIS — I10 Essential (primary) hypertension: Secondary | ICD-10-CM

## 2020-01-06 DIAGNOSIS — L900 Lichen sclerosus et atrophicus: Secondary | ICD-10-CM | POA: Diagnosis not present

## 2020-01-06 DIAGNOSIS — N760 Acute vaginitis: Secondary | ICD-10-CM | POA: Diagnosis not present

## 2020-01-06 NOTE — Progress Notes (Signed)
[facility]  Name: [name]  MRN: [id number] DOB: April 06, 1946  Chief Complaint/ HPI [name],  74 y.o. , female presents for their Follow-Up CCM visit with the clinical pharmacist via telephone due to COVID-19 Pandemic.  PCP : Brunetta Jeans, PA-C.  Their chronic conditions include: HTN, HLD, GERD, chronic back pain, depression.  Office Visits: 08/26/2019 (PCP): Notes mood doing very well. Recent spinal fusion, neck pain. Ongoing PT.   Outpatient Encounter Medications as of 01/06/2020  Medication Sig  . Acetaminophen (TYLENOL EXTRA STRENGTH PO) Take 1,000 mg by mouth every 8 (eight) hours as needed.  . Biotin 10000 MCG TABS Take 10,000 mcg by mouth daily.  . cetirizine (ZYRTEC ALLERGY) 10 MG tablet Take 10 mg by mouth as needed.   . Cholecalciferol (VITAMIN D3 PO) Take 6,000 Units by mouth daily.   . citalopram (CELEXA) 20 MG tablet Take 1 tablet (20 mg total) by mouth daily.  . cyanocobalamin 2000 MCG tablet Take 2,000 mcg by mouth daily.   . folic acid (FOLVITE) 1 MG tablet Take 2 tablets (2 mg total) by mouth daily. (Patient not taking: Reported on 09/28/2019)  . furosemide (LASIX) 40 MG tablet Take 1 tablet by mouth once daily  . naproxen sodium (ALEVE) 220 MG tablet Take 220 mg by mouth as needed.  . Polyethylene Glycol 3350 (MIRALAX PO) Take by mouth as needed.  . Red Yeast Rice 600 MG CAPS Take 1 capsule (600 mg total) by mouth daily.  . tamoxifen (NOLVADEX) 20 MG tablet Take 1 tablet by mouth once daily  . vitamin E 400 UNIT capsule Take 400 Units by mouth daily. 800 mg daily  . zinc gluconate 50 MG tablet Take 50 mg by mouth daily.   No facility-administered encounter medications on file as of 01/06/2020.   Current Diagnosis/Assessment: Goals Addressed            This Visit's Progress   . BP <140/90       CARE PLAN ENTRY Current Barriers:  . Current blood pressure regimen: o Furosemide 40 mg once daily for fluid retention  BP  Readings from Last 3 Encounters:  09/28/19 (!) 147/76  08/26/19 136/84  02/17/19 118/78 .  Current home BP readings:  o 8/12: 140/80, 143/80 on repeat. o 5/4: 152/81,147/76 on repeat.  Pharmacist Clinical Goal(s):  Marland Kitchen Over the next 180 days, patient will work with PharmD and providers to optimize antihypertensive regimen o BP <140/90 goal Interventions: . Inter-disciplinary care team collaboration (see longitudinal plan of care) . Comprehensive medication review performed; medication list updated in the electronic medical record.  . Implement blood pressure monitoring plan.  Patient Self Care Activities:  . Patient will check BP multiple times throughout the day, document, and provide at future appointments. . Patient will focus on medication adherence by continuing current medication management.  Initial goal documentation.       HTN   BP goal <130/80  Stage II HTN with today's reading of 140/80 and 08/2019 and 09/2019 reading.  BP Readings from Last 3 Encounters:  09/28/19 (!) 147/76  08/26/19 136/84  02/17/19 118/78   Lab Results  Component Value Date   CREATININE 0.81 02/17/2019   CREATININE 0.95 11/02/2018   CREATININE 0.89 07/27/2018   01/06/2020: 140/80, pulse 84;  Has not been taking furosemide. Patient is currently uncontrolled on the following medications:   Furosemide 40 mg daily   We discussed: Counseled on consistent use and possibility of future BP  medication. Patient checks BP at home infrequently. Diet recommendations - low salt.  Plan  Restart furosemide 20 mg once daily. 1 week CPA check in call, due for annual visit within next 1-2 months.  RPH to review possibly getting labs and starting hctz 12.5 mg with PCP  Hyperlipidemia   Lipid Panel     Component Value Date/Time   CHOL 197 02/17/2019 1015   TRIG 116.0 02/17/2019 1015   HDL 40.50 02/17/2019 1015   CHOLHDL 5 02/17/2019 1015   VLDL 23.2 02/17/2019 1015   LDLCALC 133 (H) 02/17/2019 1015    LDLDIRECT 179.7 02/10/2012 0920    The 10-year ASCVD risk score Mikey Bussing DC Jr., et al., 2013) is: 22.4%   Values used to calculate the score:     Age: 53 years     Sex: Female     Is Non-Hispanic African American: No     Diabetic: No     Tobacco smoker: No     Systolic Blood Pressure: 034 mmHg     Is BP treated: Yes     HDL Cholesterol: 40.5 mg/dL     Total Cholesterol: 197 mg/dL   LDL high at last visit. Patient has failed these meds in past:  atorvastatin 10 mg.   Patient is currently uncontrolled on the following medications: n/a.  Diet recommendations, needing to schedule annual labs.   Plan Continue control with diet and exercise. Will continue to monitor.   GERD   Currently controlled without medications. No issues at this time.    Patient denies dysphagia, heartburn or nausea.   Plan   Continue control with diet.   Depression   PHQ9 SCORE ONLY 01/06/2020 08/26/2019 08/26/2019  PHQ-9 Total Score 0 0 0   Denies any side effects at this time. Reports continued benefit with current regimen. Patient is currently controlled on the following medications:   Citalopram 20 mg daily  Plan Continue current medication.   Vaccines  Reviewed patient's vaccination history.  Vaccines are up to date.  Immunization History  Administered Date(s) Administered  . Fluad Quad(high Dose 65+) 02/17/2019  . Influenza Split 02/06/2011, 02/10/2012  . Influenza Whole 04/14/2007  . Influenza, High Dose Seasonal PF 04/06/2015, 03/18/2017, 01/29/2018  . Influenza,inj,Quad PF,6+ Mos 07/29/2014, 02/20/2016  . Pneumococcal Conjugate-13 02/20/2016  . Pneumococcal Polysaccharide-23 07/29/2014  . Tdap 01/03/2013  . Tetanus 01/04/2008  . Zoster Recombinat (Shingrix) 02/14/2018, 06/02/2018   Plan No recommendation at this time.  Medication Management   Pt uses [facility] for all medications. Uses pill box? Yes.  [facility] - [place], [place] - [address] [place] [address] Phone: [phone] Fax: [phone]  Patient have trouble remembering to take some of her medications. [facility] for adherence packs and home delivery.   Plan Utilize [facility] for medication synchronization, packaging and delivery.  Follow up: 3 month phone visit. 1 week f/u call to review home BP after restarting furosemide. ______________ Visit Information SDOH (Social Determinants of Health) assessments performed: Yes.   Food Insecurity: No Food Insecurity  . Worried About Charity fundraiser in the Last Year: Never true  . Ran Out of Food in the Last Year: Never true     Transportation Needs: No Transportation Needs  . Lack of Transportation (Medical): No  . Lack of Transportation (Non-Medical): No   Madelin Rear, Pharm.D., BCGP Clinical Pharmacist [facility] [phone]

## 2020-01-07 ENCOUNTER — Other Ambulatory Visit: Payer: Self-pay | Admitting: General Practice

## 2020-01-07 MED ORDER — CITALOPRAM HYDROBROMIDE 20 MG PO TABS: 20.0000 mg | ORAL_TABLET | Freq: Every day | ORAL | 1 refills | Status: DC

## 2020-01-07 MED ORDER — FUROSEMIDE 40 MG PO TABS: 40.0000 mg | ORAL_TABLET | Freq: Every day | ORAL | 0 refills | Status: DC

## 2020-01-18 DIAGNOSIS — R609 Edema, unspecified: Secondary | ICD-10-CM | POA: Diagnosis not present

## 2020-01-18 DIAGNOSIS — Z6830 Body mass index (BMI) 30.0-30.9, adult: Secondary | ICD-10-CM | POA: Diagnosis not present

## 2020-01-18 DIAGNOSIS — R69 Illness, unspecified: Secondary | ICD-10-CM | POA: Diagnosis not present

## 2020-01-18 DIAGNOSIS — E669 Obesity, unspecified: Secondary | ICD-10-CM | POA: Diagnosis not present

## 2020-01-18 DIAGNOSIS — R030 Elevated blood-pressure reading, without diagnosis of hypertension: Secondary | ICD-10-CM | POA: Diagnosis not present

## 2020-01-18 DIAGNOSIS — M069 Rheumatoid arthritis, unspecified: Secondary | ICD-10-CM | POA: Diagnosis not present

## 2020-01-18 DIAGNOSIS — C50919 Malignant neoplasm of unspecified site of unspecified female breast: Secondary | ICD-10-CM | POA: Diagnosis not present

## 2020-01-18 DIAGNOSIS — R32 Unspecified urinary incontinence: Secondary | ICD-10-CM | POA: Diagnosis not present

## 2020-01-18 DIAGNOSIS — M1990 Unspecified osteoarthritis, unspecified site: Secondary | ICD-10-CM | POA: Diagnosis not present

## 2020-01-18 DIAGNOSIS — Z008 Encounter for other general examination: Secondary | ICD-10-CM | POA: Diagnosis not present

## 2020-01-18 DIAGNOSIS — G8929 Other chronic pain: Secondary | ICD-10-CM | POA: Diagnosis not present

## 2020-01-24 ENCOUNTER — Other Ambulatory Visit: Payer: Self-pay

## 2020-01-24 ENCOUNTER — Encounter: Payer: Self-pay | Admitting: Physician Assistant

## 2020-01-24 ENCOUNTER — Ambulatory Visit (INDEPENDENT_AMBULATORY_CARE_PROVIDER_SITE_OTHER): Payer: Medicare HMO | Admitting: Physician Assistant

## 2020-01-24 VITALS — BP 130/82 | HR 78 | Temp 97.6°F | Resp 16 | Ht 66.5 in | Wt 184.0 lb

## 2020-01-24 DIAGNOSIS — G6289 Other specified polyneuropathies: Secondary | ICD-10-CM

## 2020-01-24 LAB — COMPREHENSIVE METABOLIC PANEL
ALT: 13 U/L (ref 0–35)
AST: 19 U/L (ref 0–37)
Albumin: 4 g/dL (ref 3.5–5.2)
Alkaline Phosphatase: 51 U/L (ref 39–117)
BUN: 16 mg/dL (ref 6–23)
CO2: 27 mEq/L (ref 19–32)
Calcium: 9 mg/dL (ref 8.4–10.5)
Chloride: 106 mEq/L (ref 96–112)
Creatinine, Ser: 0.79 mg/dL (ref 0.40–1.20)
GFR: 71.14 mL/min (ref >60.00)
Glucose, Bld: 80 mg/dL (ref 70–99)
Potassium: 4.5 mEq/L (ref 3.5–5.1)
Sodium: 141 mEq/L (ref 135–145)
Total Bilirubin: 0.5 mg/dL (ref 0.2–1.2)
Total Protein: 6.3 g/dL (ref 6.0–8.3)

## 2020-01-24 LAB — CBC WITH DIFFERENTIAL/PLATELET
Basophils Absolute: 0.1 10*3/uL (ref 0.0–0.1)
Basophils Relative: 1 % (ref 0.0–3.0)
Eosinophils Absolute: 0.3 10*3/uL (ref 0.0–0.7)
Eosinophils Relative: 4.1 % (ref 0.0–5.0)
HCT: 42.3 % (ref 36.0–46.0)
Hemoglobin: 14.1 g/dL (ref 12.0–15.0)
Lymphocytes Relative: 26 % (ref 12.0–46.0)
Lymphs Abs: 1.7 10*3/uL (ref 0.7–4.0)
MCHC: 33.5 g/dL (ref 30.0–36.0)
MCV: 93.1 fl (ref 78.0–100.0)
Monocytes Absolute: 0.5 10*3/uL (ref 0.1–1.0)
Monocytes Relative: 7.3 % (ref 3.0–12.0)
Neutro Abs: 4.1 10*3/uL (ref 1.4–7.7)
Neutrophils Relative %: 61.6 % (ref 43.0–77.0)
Platelets: 167 10*3/uL (ref 150.0–400.0)
RBC: 4.54 Mil/uL (ref 3.87–5.11)
RDW: 13.1 % (ref 11.5–15.5)
WBC: 6.7 10*3/uL (ref 4.0–10.5)

## 2020-01-24 LAB — B12 AND FOLATE PANEL
Folate: 16.5 ng/mL (ref >5.9)
Vitamin B-12: 293 pg/mL (ref 211–911)

## 2020-01-24 LAB — TSH: TSH: 2.21 u[IU]/mL (ref 0.35–4.50)

## 2020-01-24 NOTE — Patient Instructions (Addendum)
Please go to the lab today for blood work.  I will call you with your results. We will alter treatment regimen(s) if indicated by your results.   Consider starting to use OTC topical lidiocaine to help with nerve pains while we are figuring this out. Let me know if you want to reconsider medication wile workup is in progress.

## 2020-01-24 NOTE — Progress Notes (Signed)
Patient presents to clinic today c/o 1 month of a burning and tingling sensation of the feet and lower legs bilaterally with left worse than right.  Notes when she feels her legs they feel normal temperature.  Denies any noted skin changes or swelling of extremities.  No known prior history of neuropathy.  Denies similar symptoms of hands.  Denies mood or memory changes.  Denies any recent change in medication.  Past Medical History:  Diagnosis Date  . Arthritis    "spine" (07/28/2014)  . Blood dyscrasia    bruises and bleed easily  . Breast cancer, left breast ([place]) 07/28/2014  . Breast cancer, right breast ([place] [facility]) 1995  . Chronic back pain    "all over"  . Complication of anesthesia    went to sleep easily but hard to wake up up until elbow OR in 2010  . Depression   . Dyspnea    Normal Spirometry 03/2008 EF 65% BNP normal 11/2007  . Family history of breast cancer   . Family history of colon cancer   . Family history of pancreatic cancer   . Gastric polyps   . GERD (gastroesophageal reflux disease)   . History of chicken pox   . History of hiatal hernia   . Hx of adenomatous polyp of colon 07/03/2015  . Kidney stone    right kidney   . Lichen sclerosus   . Melanoma ([facility]) 2010   "right elbow; [facility] at [facility]"  . Mild anxiety   . Vitamin B12 deficiency   . Vitamin D deficiency     Current Outpatient Medications on File Prior to Visit  Medication Sig Dispense Refill  . Acetaminophen (TYLENOL EXTRA STRENGTH PO) Take 1,000 mg by mouth every 8 (eight) hours as needed.    . Biotin 10000 MCG TABS Take 10,000 mcg by mouth daily.    . cetirizine (ZYRTEC ALLERGY) 10 MG tablet Take 10 mg by mouth as needed.     . Cholecalciferol (VITAMIN D3 PO) Take 6,000 Units by mouth daily.     . citalopram (CELEXA) 20 MG tablet Take 1 tablet (20 mg total) by mouth daily. 90 tablet 1  . cyanocobalamin 2000 MCG tablet Take 2,000 mcg by mouth daily.     . naproxen sodium (ALEVE) 220 MG tablet Take 220  mg by mouth as needed.    . vitamin E 400 UNIT capsule Take 400 Units by mouth daily. 800 mg daily    . zinc gluconate 50 MG tablet Take 50 mg by mouth daily.    . folic acid (FOLVITE) 1 MG tablet Take 2 tablets (2 mg total) by mouth daily. (Patient not taking: Reported on 09/28/2019) 180 tablet 3  . furosemide (LASIX) 40 MG tablet Take 1 tablet (40 mg total) by mouth daily. (Patient not taking: Reported on 01/24/2020) 90 tablet 0  . Red Yeast Rice 600 MG CAPS Take 1 capsule (600 mg total) by mouth daily. (Patient not taking: Reported on 01/24/2020) 90 capsule 0  . tamoxifen (NOLVADEX) 20 MG tablet Take 1 tablet by mouth once daily (Patient not taking: Reported on 01/24/2020) 90 tablet 0   No current facility-administered medications on file prior to visit.    Allergies  Allergen Reactions  . Other Anaphylaxis and Rash  . Silodosin Rash    Facial rash  . Ace Inhibitors Other (See Comments)    REACTION: angioedema  . Buprenorphine Hcl Other (See Comments)    "crazy"  . Morphine And Related Other (  See Comments)    "crazy" "crazy"  . Codeine Rash  . Sulfonamide Derivatives Rash  . Telmisartan Rash    Family History  Problem Relation Age of Onset  . Colon cancer Cousin 46       double first cousin  . Colon cancer Cousin 54       double first cousin  . Colon polyps Father        between 61-20  . Dementia Father   . CAD Father        CABG at age 64  . Stroke Mother   . Pancreatic cancer Brother 84  . Colon polyps Brother        between 10-20  . Cancer Paternal Uncle        NOS  . Anemia Paternal Grandfather        pernicious anemia  . Breast cancer Cousin 79       double first cousin  . Breast cancer Cousin 74       paternal cousin  . Stroke Other        F 1st degree relative 42, M 1st degree relative  . Esophageal cancer Neg Hx   . Stomach cancer Neg Hx     Social History   Socioeconomic History  . Marital status: Married    Spouse name: Not on file  . Number of  children: 2  . Years of education: Not on file  . Highest education level: Not on file  Occupational History  . Occupation: Freight forwarder  Tobacco Use  . Smoking status: Never Smoker  . Smokeless tobacco: Never Used  . Tobacco comment: Regular Exercise - Yes  Vaping Use  . Vaping Use: Never used  Substance and Sexual Activity  . Alcohol use: No  . Drug use: No  . Sexual activity: Yes  Other Topics Concern  . Not on file  Social History Narrative   She lives with husband.     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 [place] Strain:   . Difficulty of Paying Living Expenses: Not on file  Food Insecurity: No Food Insecurity  . Worried About Charity fundraiser in the Last Year: Never true  . Ran Out of Food in the Last Year: Never true  Transportation Needs: No Transportation Needs  . Lack of Transportation (Medical): No  . Lack of Transportation (Non-Medical): No  Physical Activity:   . Days of Exercise per Week: Not on file  . Minutes of Exercise per Session: Not on file  Stress:   . Feeling of Stress : Not on file  Social Connections:   . Frequency of Communication with Friends and Family: Not on file  . Frequency of Social Gatherings with Friends and Family: Not on file  . Attends Religious Services: Not on file  . Active Member of Clubs or Organizations: Not on file  . Attends Archivist Meetings: Not on file  . Marital Status: Not on file   Review of Systems - See HPI.  All other ROS are negative.  BP 130/82   Pulse 78   Temp 97.6 F (36.4 C) (Temporal)   Resp 16   Ht 5' 6.5" (1.689 m)   Wt 184 lb (83.5 kg)   SpO2 98%   BMI 29.25 kg/m   Physical Exam Vitals reviewed.  Constitutional:      Appearance: Normal  appearance.  Cardiovascular:     Rate and Rhythm: Normal rate and regular rhythm.     Pulses: Normal pulses.     Heart sounds: Normal heart  sounds.  Pulmonary:     Effort: Pulmonary effort is normal.  Musculoskeletal:     Cervical back: Neck supple.  Neurological:     General: No focal deficit present.     Mental Status: She is alert and oriented to person, place, and time.     Cranial Nerves: No cranial nerve deficit.     Sensory: No sensory deficit.     Motor: No weakness.     Coordination: Coordination normal.     Comments: Some hyperesthesia noted of bilateral lower extremities. Skin is healthy without swelling, bruising or erythema. Normal temperature and pulses of extremities. Sensation intact.       Assessment/Plan: 1. Other polyneuropathy Onset 1 month ago.  Bilateral distal lower extremities.  No history of diabetes.  Does have remote history of chemotherapy but has had no ongoing symptoms since that time.  Will check labs today to further assess.  Discussed topical lidocaine.  Gabapentin recommended but she declines at present.  Would like to get work-up first.  If everything looks normal would recommend we start gabapentin and consider assessment by neurology. - CBC with Differential/Platelet - Comprehensive metabolic panel - TSH - I69 and Folate Panel  This visit occurred during the SARS-CoV-2 public health emergency.  Safety protocols were in place, including screening questions prior to the visit, additional usage of staff PPE, and extensive cleaning of exam room while observing appropriate contact time as indicated for disinfecting solutions.     Leeanne Rio, PA-C

## 2020-01-25 ENCOUNTER — Other Ambulatory Visit: Payer: Medicare HMO

## 2020-01-25 DIAGNOSIS — D519 Vitamin B12 deficiency anemia, unspecified: Secondary | ICD-10-CM

## 2020-01-27 LAB — METHYLMALONIC ACID, SERUM: Methylmalonic Acid, Quant: 107 nmol/L (ref 87–318)

## 2020-01-28 ENCOUNTER — Other Ambulatory Visit: Payer: Self-pay | Admitting: Emergency Medicine

## 2020-01-28 ENCOUNTER — Encounter: Payer: Self-pay | Admitting: Emergency Medicine

## 2020-01-28 DIAGNOSIS — G6289 Other specified polyneuropathies: Secondary | ICD-10-CM

## 2020-01-28 MED ORDER — CYANOCOBALAMIN 2000 MCG PO TABS: 3000.0000 ug | ORAL_TABLET | Freq: Every day | ORAL | Status: DC

## 2020-02-01 ENCOUNTER — Encounter: Payer: Self-pay | Admitting: Neurology

## 2020-02-02 DIAGNOSIS — R69 Illness, unspecified: Secondary | ICD-10-CM | POA: Diagnosis not present

## 2020-02-03 ENCOUNTER — Encounter: Payer: Self-pay | Admitting: Physician Assistant

## 2020-02-04 MED ORDER — PREGABALIN 75 MG PO CAPS: 75.0000 mg | ORAL_CAPSULE | Freq: Two times a day (BID) | ORAL | 1 refills | Status: DC

## 2020-02-04 NOTE — Addendum Note (Signed)
Addended by: Brunetta Jeans on: 02/04/2020 07:47 AM   Modules accepted: Orders

## 2020-02-06 DIAGNOSIS — T7840XA Allergy, unspecified, initial encounter: Secondary | ICD-10-CM | POA: Diagnosis not present

## 2020-02-15 NOTE — Progress Notes (Signed)
[name]  [phone] [phone]     ID: [name] DOB: 06-Oct-1945  MR#: [id number]  HCW#:[id number]  Patient Care Team: Delorse Limber as PCP - General (Physician Assistant) Josue Hector, MD (Cardiology) Cheri Fowler, MD as Attending Physician (Obstetrics and Gynecology) Gordie Crumby, Virgie Dad, MD (Hematology and Oncology) Gatha Mayer, MD as Consulting Physician (Gastroenterology) Iona Beard, [place] as Referring Physician (Optometry) Jovita Kussmaul, MD as Consulting Physician (General Surgery) Ardis Hughs, MD as Attending Physician (Urology) Charolotte Capuchin, MD as Consulting Physician (Dentistry) Ivan Croft (Spine Surgery) Irene Limbo, MD as Consulting Physician (Plastic Surgery) Madelin Rear, [facility] as Pharmacist (Pharmacist) OTHER MD:  CHIEF COMPLAINT: Bilateral breast cancers (s/p bilateral mastectomies)  CURRENT TREATMENT: Completing 5 years of antiestrogens    INTERVAL HISTORY Analicia returns today for follow-up of her estrogen receptor positive breast cancers.   She ran out of tamoxifen approximately 3 weeks ago and did not have that resumed since she knew we were discontinuing it at this visit.  Since her last visit, she has not undergone any additional studies.   REVIEW OF SYSTEMS: [name] is currently going through a difficult situation since her daughter [name] husband is quite ill and the daughter and son-in-law are living with her. She has not had the COVID-19 vaccine and is not planning to receive it. She just does not trust it she says. She had an allergic reaction to something and was given some steroids which is affecting her sleep. Otherwise she tells me she was sleeping quite well. She exercises by walking about 20 minutes once a day. Detailed review of systems was otherwise noncontributory   HISTORY OF BREAST CANCER From the original intake note:  [name], now [name], was my  patient in 1996, at which time she had an early stage breast cancer treated with mastectomy with TRAM flap reconstruction followed by CMF chemotherapy 8. She did not receive radiation or anti-estrogens. She was released from follow-up early this sensory.  More recently [name] had routine left screening mammography with tomography at the breast Center 04/27/2014. Breast density was category B. A possible mass in the left breast was noted and on 05/09/2014 she underwent diagnostic left mammography and ultrasonography showing a 9 mm spiculated asymmetry in the left breast upper outer quadrant. This was not palpable. Ultrasound identified a 1.0 cm irregular hypoechoic lesion in that area. Ultrasound of the left axilla was negative.  Biopsy of the left breast mass 05/09/2014 showed ([id number]) an invasive breast cancer with lobular features, grade 1. The tumor was estrogen receptor 100% positive, progesterone receptor 99% positive, both with strong staining intensity. The MIB-1 was 8%. HER-2 was not amplified, the signals ratio being 1.10 and the number per cell 1.70.  On 05/17/2014 the patient underwent bilateral breast MRI. This showed breast composition category C. In the upper outer quadrant of the left breast there was stippled non-masslike enhancement measuring 3.9 cm. Within this region there was an area of slightly increased consolidation corresponding to the smaller mass noted on ultrasonography. There were no abnormal appearing lymph nodes in the right breast was unremarkable.  [name]'s case was presented at the breast cancer multidisciplinary conference 05/18/2014. The patient is a candidate for breast conservation, and if she opts for that her surgeon may wish to do a generous lumpectomy or proceed to biopsy of the more extensive area of stippled non-masslike enhancement. It was felt that since the patient had a mastectomy on the  right she might choose a mastectomy on the left. An Oncotype test  was also recommended.  [name] was evaluated at the breast clinic 05/18/2014. Her subsequent history is as detailed below   PAST MEDICAL HISTORY: Past Medical History:  Diagnosis Date  . Arthritis    "spine" (07/28/2014)  . Blood dyscrasia    bruises and bleed easily  . Breast cancer, left breast ([place]) 07/28/2014  . Breast cancer, right breast ([place]) 1995  . Chronic back pain    "all over"  . Complication of anesthesia    went to sleep easily but hard to wake up up until elbow OR in 2010  . Depression   . Dyspnea    Normal Spirometry 03/2008 EF 65% BNP normal 11/2007  . Family history of breast cancer   . Family history of colon cancer   . Family history of pancreatic cancer   . Gastric polyps   . GERD (gastroesophageal reflux disease)   . History of chicken pox   . History of hiatal hernia   . Hx of adenomatous polyp of colon 07/03/2015  . Kidney stone    right kidney   . Lichen sclerosus   . Melanoma ([place]) 2010   "right elbow; [facility]"  . Mild anxiety   . Vitamin B12 deficiency   . Vitamin D deficiency     PAST SURGICAL HISTORY: Past Surgical History:  Procedure Laterality Date  . BREAST BIOPSY Left 04/2014  . BREAST RECONSTRUCTION WITH PLACEMENT OF TISSUE EXPANDER AND FLEX HD (ACELLULAR HYDRATED DERMIS) Left 07/28/2014   Procedure: LEFT BREAST RECONSTRUCTION PLACEMENT OF LEFT TISSUE EXPANDER ;  Surgeon: Crissie Reese, MD;  Location: [facility];  Service: Plastics;  Laterality: Left;  . BREAST RECONSTRUCTION WITH PLACEMENT OF TISSUE EXPANDER AND FLEX HD (ACELLULAR HYDRATED DERMIS) Left 09/08/2014   Procedure: REMOVAL OF TISSUE EXPANDER FROM LEFT BREAST;  Surgeon: Crissie Reese, MD;  Location: [facility];  Service: Plastics;  Laterality: Left;  . BUNIONECTOMY Bilateral 1970's  . COLONOSCOPY     Dr Sharlett Iles  . CYSTOSCOPY WITH RETROGRADE PYELOGRAM, URETEROSCOPY AND STENT PLACEMENT Right 06/09/2015   Procedure: CYSTOSCOPY WITH RIGHT RETROGRADE PYELOGRAM, RIGHT URETEROSCOPY AND RIGHT  URTERAL STENT PLACEMENT;  Surgeon: Ardis Hughs, MD;  Location: [facility];  Service: Urology;  Laterality: Right;  . ESOPHAGOGASTRODUODENOSCOPY    . HERNIA REPAIR    . HOLMIUM LASER APPLICATION Right 09/22/7679   Procedure: HOLMIUM LASER APPLICATION;  Surgeon: Ardis Hughs, MD;  Location: [facility];  Service: Urology;  Laterality: Right;  . LATISSIMUS FLAP TO BREAST Left 09/08/2014   Procedure: LEFT LATISSIMUS FLAP TO BREAST WITH SALINE IMPLANT FOR BREAST RECONSTRUCTION;  Surgeon: Crissie Reese, MD;  Location: [facility];  Service: Plastics;  Laterality: Left;  . LUMBAR FUSION  01/2018   L5-S1 transitional segmental laminectomy and Fusion  . MASTECTOMY Right 1996    chemotherapy. pt. states 13 lymph nodes were removed  . MASTECTOMY COMPLETE / SIMPLE W/ SENTINEL NODE BIOPSY Left 07/28/2014  . MASTECTOMY W/ SENTINEL NODE BIOPSY Left 07/28/2014   Procedure: LEFT MASTECTOMY WITH SENTINEL LYMPH NODE MAPPING;  Surgeon: Autumn Messing III, MD;  Location: [facility];  Service: General;  Laterality: Left;  Marland Kitchen MELANOMA EXCISION Right 2010   From elbow-- Done at [facility]   . NISSEN FUNDOPLICATION  15/7262  . RECONSTRUCTION BREAST IMMEDIATE / DELAYED W/ TISSUE EXPANDER Left 07/28/2014  . TEMPOROMANDIBULAR JOINT SURGERY Bilateral 1987  . TONSILLECTOMY      FAMILY HISTORY Family History  Problem Relation Age of Onset  . Colon cancer Cousin 79       double first cousin  . Colon cancer Cousin 60       double first cousin  . Colon polyps Father        between 52-20  . Dementia Father   . CAD Father        CABG at age 31  . Stroke Mother   . Pancreatic cancer Brother 27  . Colon polyps Brother        between 10-20  . Cancer Paternal Uncle        NOS  . Anemia Paternal Grandfather        pernicious anemia  . Breast cancer Cousin 2       double first cousin  . Breast cancer Cousin 74       paternal cousin  . Stroke Other        F 1st degree relative 3, M 1st degree relative  . Esophageal cancer Neg Hx   .  Stomach cancer Neg Hx    the patient's father died at age 87 in the setting of Alzheimer's disease. The patient's mother died at age 60 following a stroke. Saesha has 3 brothers, no sisters. Shauntelle's niece, Kela Millin, was my patient with a history of breast cancer diagnosed at age 61. She succumbed to that tumor. The patient also has 2 paternal cousins with breast cancer diagnosed at age 69 and age 30. One brother has prostate cancer diagnosed at age 23. The other brother has a history of throat cancer diagnosed at age 61. There is no other history of breast or ovarian cancer in the family to her knowledge.  GENETICS TESTING:  MUTYH mutation, normal BRCA genes   GYNECOLOGIC HISTORY:  No LMP recorded. (Menstrual status: Chemotherapy). Menarche age 76, first live birth age 57. She is GX P2. She went through menopause in 1996 at the time of her chemotherapy. She did not take hormone replacement. 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 Her husband of 3 years, Marlou Sa, is a Gaffer at [employer]. He matches colors for a [employer]. Shir has 2 children from a prior marriage, Becky Sax, 74 years old, lives in [place] and is a homemaker; and Corene Cornea, 41, who lives in [place] and works as a Freight forwarder. Sandstrom has 4 biological grandchildren. Marlou Sa has a son, Mitzi Hansen, who lives in [place] and worse at [employer]. Marlou Sa has 4 grandchildren of his. The patient is a Psychologist, forensic.    ADVANCED DIRECTIVES: In place; currently Arista's children Becky Sax and Corene Cornea are co- healthcare powers of attorney. Corene Cornea can be reached at [place]: Social History   Tobacco Use  . Smoking status: Never Smoker  . Smokeless tobacco: Never Used  . Tobacco comment: Regular Exercise - Yes  Vaping Use  . Vaping Use: Never used  Substance  Use Topics  . Alcohol use: No  . Drug use: No     Colonoscopy: FEB 2017  PAP: 2013  Bone density: Remote  Lipid panel:  Allergies  Allergen Reactions  . Other Anaphylaxis and Rash  . Silodosin Rash    Facial rash  . Ace Inhibitors Other (See Comments)    REACTION: angioedema  . Buprenorphine Hcl Other (See Comments)    "crazy"  . Morphine And Related Other (See  Comments)    "crazy" "crazy"  . Codeine Rash  . Sulfonamide Derivatives Rash  . Telmisartan Rash    Current Outpatient Medications  Medication Sig Dispense Refill  . Acetaminophen (TYLENOL EXTRA STRENGTH PO) Take 1,000 mg by mouth every 8 (eight) hours as needed.    . Biotin 10000 MCG TABS Take 10,000 mcg by mouth daily.    . cetirizine (ZYRTEC ALLERGY) 10 MG tablet Take 10 mg by mouth as needed.     . Cholecalciferol (VITAMIN D3 PO) Take 6,000 Units by mouth daily.     . citalopram (CELEXA) 20 MG tablet Take 1 tablet (20 mg total) by mouth daily. 90 tablet 1  . cyanocobalamin 2000 MCG tablet Take 1.5 tablets (3,000 mcg total) by mouth daily. 90 tablet   . folic acid (FOLVITE) 1 MG tablet Take 2 tablets (2 mg total) by mouth daily. (Patient not taking: Reported on 09/28/2019) 180 tablet 3  . naproxen sodium (ALEVE) 220 MG tablet Take 220 mg by mouth as needed.    . pregabalin (LYRICA) 75 MG capsule Take 1 capsule (75 mg total) by mouth 2 (two) times daily. 60 capsule 1  . tamoxifen (NOLVADEX) 20 MG tablet Take 1 tablet by mouth once daily (Patient not taking: Reported on 01/24/2020) 90 tablet 0  . vitamin E 400 UNIT capsule Take 400 Units by mouth daily. 800 mg daily    . zinc gluconate 50 MG tablet Take 50 mg by mouth daily.     No current facility-administered medications for this visit.    OBJECTIVE: White woman in no acute distress  Vitals:   02/16/20 1007  BP: (!) 164/81  Pulse: 76  Resp: 18  Temp: (!) 97 F (36.1 C)  SpO2: 100%     Body mass index is 29.57 kg/m.    ECOG FS:1 - Symptomatic but  completely ambulatory  Sclerae unicteric, EOMs intact Wearing a mask No cervical or supraclavicular adenopathy Lungs no rales or rhonchi Heart regular rate and rhythm Abd soft, nontender, positive bowel sounds MSK no focal spinal tenderness, no upper extremity lymphedema Neuro: nonfocal, well oriented, appropriate affect Breasts: Status post bilateral mastectomies with bilateral reconstruction. There is no evidence of chest wall recurrence. Both axillae are benign.   LAB RESULTS:  CMP     Component Value Date/Time   NA 141 01/24/2020 1025   NA 143 08/14/2016 1200   K 4.5 01/24/2020 1025   K 4.2 08/14/2016 1200   CL 106 01/24/2020 1025   CO2 27 01/24/2020 1025   CO2 27 08/14/2016 1200   GLUCOSE 80 01/24/2020 1025   GLUCOSE 67 (L) 08/14/2016 1200   BUN 16 01/24/2020 1025   BUN 13.3 08/14/2016 1200   CREATININE 0.79 01/24/2020 1025   CREATININE 0.89 07/27/2018 1344   CREATININE 0.9 08/14/2016 1200   CALCIUM 9.0 01/24/2020 1025   CALCIUM 9.7 08/14/2016 1200   PROT 6.3 01/24/2020 1025   PROT 6.6 08/14/2016 1200   ALBUMIN 4.0 01/24/2020 1025   ALBUMIN 3.8 08/14/2016 1200   AST 19 01/24/2020 1025   AST 24 08/14/2016 1200   ALT 13 01/24/2020 1025   ALT 23 08/14/2016 1200   ALKPHOS 51 01/24/2020 1025   ALKPHOS 71 08/14/2016 1200   BILITOT 0.5 01/24/2020 1025   BILITOT 0.56 08/14/2016 1200   GFRNONAA 60 (L) 11/02/2018 1256   GFRNONAA 65 07/27/2018 1344   GFRAA >60 11/02/2018 1256   GFRAA 75 07/27/2018 1344    INo results found for: SPEP,  UPEP  Lab Results  Component Value Date   WBC 5.3 02/16/2020   NEUTROABS 3.1 02/16/2020   HGB 14.1 02/16/2020   HCT 41.7 02/16/2020   MCV 92.5 02/16/2020   PLT 180 02/16/2020      Chemistry      Component Value Date/Time   NA 141 01/24/2020 1025   NA 143 08/14/2016 1200   K 4.5 01/24/2020 1025   K 4.2 08/14/2016 1200   CL 106 01/24/2020 1025   CO2 27 01/24/2020 1025   CO2 27 08/14/2016 1200   BUN 16 01/24/2020 1025    BUN 13.3 08/14/2016 1200   CREATININE 0.79 01/24/2020 1025   CREATININE 0.89 07/27/2018 1344   CREATININE 0.9 08/14/2016 1200      Component Value Date/Time   CALCIUM 9.0 01/24/2020 1025   CALCIUM 9.7 08/14/2016 1200   ALKPHOS 51 01/24/2020 1025   ALKPHOS 71 08/14/2016 1200   AST 19 01/24/2020 1025   AST 24 08/14/2016 1200   ALT 13 01/24/2020 1025   ALT 23 08/14/2016 1200   BILITOT 0.5 01/24/2020 1025   BILITOT 0.56 08/14/2016 1200       No results found for: LABCA2  No components found for: LABCA125  No results for input(s): INR in the last 168 hours.  Urinalysis    Component Value Date/Time   COLORURINE YELLOW 06/27/2016 1048   APPEARANCEUR CLEAR 06/27/2016 1048   LABSPEC 1.015 06/27/2016 1048   PHURINE 7.5 06/27/2016 1048   GLUCOSEU NEGATIVE 06/27/2016 1048   HGBUR NEGATIVE 06/27/2016 1048   BILIRUBINUR NEGATIVE 06/27/2016 1048   BILIRUBINUR neg 02/29/2016 1621   KETONESUR NEGATIVE 06/27/2016 1048   PROTEINUR neg 02/29/2016 1621   PROTEINUR NEGATIVE 08/28/2014 1438   UROBILINOGEN 0.2 06/27/2016 1048   NITRITE NEGATIVE 06/27/2016 1048   LEUKOCYTESUR NEGATIVE 06/27/2016 1048    STUDIES: No results found.   ASSESSMENT: 74 y.o. [place] woman status post left breast upper outer quadrant biopsy 05/09/2014 for a clinical T1-T2, N0 (stage I-II) invasive lobular breast cancer, estrogen receptor 100% positive, progesterone receptor 99% positive, with an MIB-1 of 8%, and no HER-2 amplification.  (1) history of right modified radical mastectomy with TRAM reconstruction 1996, followed by chemotherapy for 6 months; did not receive antiestrogen therapy or radiation therapy adjuvantly  (2) tamoxifen started neoadjuvantly 05/18/2014  (3) status post left mastectomy and sentinel lymph node sampling 07/28/2014 for a pT1c pN0(i+). Stage IA invasive ductal carcinoma, grade 1, with repeat HER-2 again negative. Margins were ample  (4) Oncotype score of 3 predicts an outside  the breast risk of recurrence within 10 years of 4% if the patient's only systemic treatment is tamoxifen for 5 years. It also predicts no significant benefit from chemotherapy.  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 Sequencing and rearrangement analysis for the following 32 genes found no deleterious mutations: APC, ATM, BARD1, BMPR1A, BRCA1, BRCA2, BRIP1, CDH1, CDK4, CDKN2A, CHEK2, EPCAM, GREM1, MLH1, MRE11A, MSH2, MSH6, MUTYH, NBN, NF1, PALB2, PMS2, POLD1, POLE, PTEN, RAD50, RAD51D, SMAD4, SMARCA4, STK11, and TP53.  (a) Heterozygous MUTYH mutations are not associated with an increased risk for cancer in the patient, however, other family members may be at risk for homozygous mutations and therefore family testing  should be considered.   (7) anastrozole discontinued May 2019, tamoxifen resumed  (a) interactions with citalopram likely not clinically relevant  (b) completed 5 years of antiestrogens September 2021  PLAN: Alaiya is now five and half years out from definitive surgery for her breast cancer with no evidence of disease recurrence. This is very favorable.  She stopped tamoxifen about 3 weeks ago. She really has not noticed any changes but she might in the next couple of months find that she has fewer hot flashes or perhaps a little bit more energy.  She has a difficult situation at home currently. Hopefully once that issue is resolved she will feel little better. She is hoping to be able to travel  She remains dissatisfied with her left breast reconstruction. I gave her the names of two plastic surgeons she can contact for second and third  opinions  At this point though I feel comfortable releasing her to her primary care physician's care. All she will need in terms of breast cancer follow-up is a yearly chest wall exam.  I will be glad to see Annalynne again at any point in the future if and when the need arises but as of now are making no further routine appointment for her here  Total encounter time 20 minutes.* Memory Heinrichs, Virgie Dad, MD  02/16/20 10:28 AM Medical Oncology and Hematology [facility] [place], [place] [address] Tel. [phone]    Fax. [phone]   I, Wilburn Mylar, am acting as scribe for Dr. Virgie Dad. Agatha Duplechain.  I, Lurline Del MD, have reviewed the above documentation for accuracy and completeness, and I agree with the above.   *Total Encounter Time as defined by the Centers for Medicare and Medicaid Services includes, in addition to the face-to-face time of a patient visit (documented in the note above) non-face-to-face time: obtaining and reviewing outside history, ordering and reviewing medications, tests or procedures, care coordination (communications with other health care professionals or caregivers) and documentation in the medical record.

## 2020-02-16 ENCOUNTER — Other Ambulatory Visit: Payer: Self-pay

## 2020-02-16 ENCOUNTER — Inpatient Hospital Stay: Payer: Medicare HMO

## 2020-02-16 ENCOUNTER — Inpatient Hospital Stay: Payer: Medicare HMO | Attending: Oncology | Admitting: Oncology

## 2020-02-16 VITALS — BP 164/81 | HR 76 | Temp 97.0°F | Resp 18 | Ht 66.5 in | Wt 186.0 lb

## 2020-02-16 DIAGNOSIS — C4360 Malignant melanoma of unspecified upper limb, including shoulder: Secondary | ICD-10-CM

## 2020-02-16 DIAGNOSIS — Z79810 Long term (current) use of selective estrogen receptor modulators (SERMs): Secondary | ICD-10-CM | POA: Insufficient documentation

## 2020-02-16 DIAGNOSIS — C50412 Malignant neoplasm of upper-outer quadrant of left female breast: Secondary | ICD-10-CM | POA: Diagnosis not present

## 2020-02-16 DIAGNOSIS — C50011 Malignant neoplasm of nipple and areola, right female breast: Secondary | ICD-10-CM

## 2020-02-16 DIAGNOSIS — E538 Deficiency of other specified B group vitamins: Secondary | ICD-10-CM | POA: Insufficient documentation

## 2020-02-16 DIAGNOSIS — Z170 Estrogen receptor positive status [ER+]: Secondary | ICD-10-CM

## 2020-02-16 DIAGNOSIS — F329 Major depressive disorder, single episode, unspecified: Secondary | ICD-10-CM | POA: Diagnosis not present

## 2020-02-16 DIAGNOSIS — Z1589 Genetic susceptibility to other disease: Secondary | ICD-10-CM | POA: Diagnosis not present

## 2020-02-16 DIAGNOSIS — K760 Fatty (change of) liver, not elsewhere classified: Secondary | ICD-10-CM

## 2020-02-16 DIAGNOSIS — Z853 Personal history of malignant neoplasm of breast: Secondary | ICD-10-CM | POA: Diagnosis not present

## 2020-02-16 DIAGNOSIS — Z79899 Other long term (current) drug therapy: Secondary | ICD-10-CM | POA: Insufficient documentation

## 2020-02-16 DIAGNOSIS — Z9013 Acquired absence of bilateral breasts and nipples: Secondary | ICD-10-CM | POA: Insufficient documentation

## 2020-02-16 DIAGNOSIS — M1990 Unspecified osteoarthritis, unspecified site: Secondary | ICD-10-CM | POA: Insufficient documentation

## 2020-02-16 DIAGNOSIS — K219 Gastro-esophageal reflux disease without esophagitis: Secondary | ICD-10-CM | POA: Insufficient documentation

## 2020-02-16 DIAGNOSIS — E559 Vitamin D deficiency, unspecified: Secondary | ICD-10-CM | POA: Diagnosis not present

## 2020-02-16 DIAGNOSIS — R69 Illness, unspecified: Secondary | ICD-10-CM | POA: Diagnosis not present

## 2020-02-16 LAB — CBC WITH DIFFERENTIAL/PLATELET
Abs Immature Granulocytes: 0.03 10*3/uL (ref 0.00–0.07)
Basophils Absolute: 0.1 10*3/uL (ref 0.0–0.1)
Basophils Relative: 1 %
Eosinophils Absolute: 0.2 10*3/uL (ref 0.0–0.5)
Eosinophils Relative: 4 %
HCT: 41.7 % (ref 36.0–46.0)
Hemoglobin: 14.1 g/dL (ref 12.0–15.0)
Immature Granulocytes: 1 %
Lymphocytes Relative: 26 %
Lymphs Abs: 1.4 10*3/uL (ref 0.7–4.0)
MCH: 31.3 pg (ref 26.0–34.0)
MCHC: 33.8 g/dL (ref 30.0–36.0)
MCV: 92.5 fL (ref 80.0–100.0)
Monocytes Absolute: 0.6 10*3/uL (ref 0.1–1.0)
Monocytes Relative: 11 %
Neutro Abs: 3.1 10*3/uL (ref 1.7–7.7)
Neutrophils Relative %: 57 %
Platelets: 180 10*3/uL (ref 150–400)
RBC: 4.51 MIL/uL (ref 3.87–5.11)
RDW: 13.1 % (ref 11.5–15.5)
WBC: 5.3 10*3/uL (ref 4.0–10.5)
nRBC: 0 % (ref 0.0–0.2)

## 2020-02-16 LAB — COMPREHENSIVE METABOLIC PANEL
ALT: 17 U/L (ref 0–44)
AST: 18 U/L (ref 15–41)
Albumin: 3.5 g/dL (ref 3.5–5.0)
Alkaline Phosphatase: 52 U/L (ref 38–126)
Anion gap: 5 (ref 5–15)
BUN: 12 mg/dL (ref 8–23)
CO2: 30 mmol/L (ref 22–32)
Calcium: 8.9 mg/dL (ref 8.9–10.3)
Chloride: 108 mmol/L (ref 98–111)
Creatinine, Ser: 0.79 mg/dL (ref 0.44–1.00)
GFR calc Af Amer: >60 mL/min (ref >60)
GFR calc non Af Amer: >60 mL/min (ref >60)
Glucose, Bld: 81 mg/dL (ref 70–99)
Potassium: 4.2 mmol/L (ref 3.5–5.1)
Sodium: 143 mmol/L (ref 135–145)
Total Bilirubin: 0.6 mg/dL (ref 0.3–1.2)
Total Protein: 6.5 g/dL (ref 6.5–8.1)

## 2020-02-17 ENCOUNTER — Telehealth: Payer: Self-pay | Admitting: Oncology

## 2020-02-17 DIAGNOSIS — H10533 Contact blepharoconjunctivitis, bilateral: Secondary | ICD-10-CM | POA: Diagnosis not present

## 2020-02-17 DIAGNOSIS — H2513 Age-related nuclear cataract, bilateral: Secondary | ICD-10-CM | POA: Diagnosis not present

## 2020-02-17 DIAGNOSIS — H25013 Cortical age-related cataract, bilateral: Secondary | ICD-10-CM | POA: Diagnosis not present

## 2020-02-17 NOTE — Telephone Encounter (Signed)
No 9/22 los. No changes made to pt's schedule.

## 2020-02-18 ENCOUNTER — Telehealth: Payer: Self-pay | Admitting: Oncology

## 2020-02-18 NOTE — Telephone Encounter (Signed)
Received message from RN stating pt requested records be sent to [facility] @ [phone]   Release:  [id number]

## 2020-02-25 NOTE — Progress Notes (Signed)
Subjective:   [name] is a 74 y.o. female who presents for Medicare Annual (Subsequent) preventive examination.  I connected with Janiyha today by telephone and verified that I am speaking with the correct person using two identifiers. Location patient: home Location provider: work Persons participating in the virtual visit: patient, Marine scientist.    I discussed the limitations, risks, security and privacy concerns of performing an evaluation and management service by telephone and the availability of in person appointments. I also discussed with the patient that there may be a patient responsible charge related to this service. The patient expressed understanding and verbally consented to this telephonic visit.    Interactive audio and video telecommunications were attempted between this provider and patient, however failed, due to patient having technical difficulties OR patient did not have access to video capability.  We continued and completed visit with audio only.  Some vital signs may be absent or patient reported.   Time Spent with patient on telephone encounter: 20 minutes  Review of Systems     Cardiac Risk Factors include: advanced age (>59men, >41 women);hypertension;obesity (BMI >30kg/m2);sedentary lifestyle     Objective:    Today's Vitals   02/28/20 1417  Weight: 186 lb (84.4 kg)  Height: 5\' 6"  (1.676 m)   Body mass index is 30.02 kg/m.  Advanced Directives 02/28/2020 02/17/2019 02/17/2019 01/29/2018 06/27/2016 04/04/2016 02/22/2016  Does Patient Have a Medical Advance Directive? Yes - Yes Yes Yes Yes Yes  Type of Paramedic of [place];Living will [place];Living will [place];Living will [place];Living will [place];Living will [place];Living will -  Does patient want to make changes to medical advance directive? - - - - - - -  Copy of [place] in Chart? Yes - validated most recent copy scanned in chart (See row information) Yes - validated most recent copy scanned in chart (See row information) - No - copy requested No - copy requested - -    Current Medications (verified) Outpatient Encounter Medications as of 02/28/2020  Medication Sig  . Acetaminophen (TYLENOL EXTRA STRENGTH PO) Take 1,000 mg by mouth every 8 (eight) hours as needed.  . Biotin 10000 MCG TABS Take 10,000 mcg by mouth daily.  . cetirizine (ZYRTEC ALLERGY) 10 MG tablet Take 10 mg by mouth as needed.   . Cholecalciferol (VITAMIN D3 PO) Take 6,000 Units by mouth daily.   . citalopram (CELEXA) 20 MG tablet Take 1 tablet (20 mg total) by mouth daily.  . cyanocobalamin 2000 MCG tablet Take 1.5 tablets (3,000 mcg total) by mouth daily.  Marland Kitchen doxycycline (VIBRAMYCIN) 100 MG capsule Take 1 capsule (100 mg total) by mouth 2 (two) times daily.  . folic acid (FOLVITE) 1 MG tablet Take 2 tablets (2 mg total) by mouth daily.  . naproxen sodium (ALEVE) 220 MG tablet Take 220 mg by mouth as needed.  . pregabalin (LYRICA) 75 MG capsule Take 1 capsule (75 mg total) by mouth 2 (two) times daily.  . vitamin E 400 UNIT capsule Take 400 Units by mouth daily. 800 mg daily  . zinc gluconate 50 MG tablet Take 50 mg by mouth daily.  . [DISCONTINUED] tamoxifen (NOLVADEX) 20 MG tablet Take 1 tablet by mouth once daily (Patient not taking: Reported on 01/24/2020)   No facility-administered encounter medications on file as of 02/28/2020.    Allergies (verified) Other, Silodosin, Ace inhibitors, Buprenorphine hcl, Morphine  and related, Codeine, Sulfonamide derivatives, and Telmisartan   History: Past Medical History:  Diagnosis Date  . Arthritis    "spine" (07/28/2014)  . Blood dyscrasia    bruises and bleed easily  . Breast cancer, left breast ([place]) 07/28/2014  . Breast cancer, right breast ([place]) 1995  . Chronic back pain    "all over"  . Complication of anesthesia     went to sleep easily but hard to wake up up until elbow OR in 2010  . Depression   . Dyspnea    Normal Spirometry 03/2008 EF 65% BNP normal 11/2007  . Family history of breast cancer   . Family history of colon cancer   . Family history of pancreatic cancer   . Gastric polyps   . GERD (gastroesophageal reflux disease)   . History of chicken pox   . History of hiatal hernia   . Hx of adenomatous polyp of colon 07/03/2015  . Kidney stone    right kidney   . Lichen sclerosus   . Melanoma ([place]) 2010   "right elbow; treated at [facility]"  . Mild anxiety   . Vitamin B12 deficiency   . Vitamin D deficiency    Past Surgical History:  Procedure Laterality Date  . BREAST BIOPSY Left 04/2014  . BREAST RECONSTRUCTION WITH PLACEMENT OF TISSUE EXPANDER AND FLEX HD (ACELLULAR HYDRATED DERMIS) Left 07/28/2014   Procedure: LEFT BREAST RECONSTRUCTION PLACEMENT OF LEFT TISSUE EXPANDER ;  Surgeon: Crissie Reese, MD;  Location: [place];  Service: Plastics;  Laterality: Left;  . BREAST RECONSTRUCTION WITH PLACEMENT OF TISSUE EXPANDER AND FLEX HD (ACELLULAR HYDRATED DERMIS) Left 09/08/2014   Procedure: REMOVAL OF TISSUE EXPANDER FROM LEFT BREAST;  Surgeon: Crissie Reese, MD;  Location: [place];  Service: Plastics;  Laterality: Left;  . BUNIONECTOMY Bilateral 1970's  . COLONOSCOPY     Dr Sharlett Iles  . CYSTOSCOPY WITH RETROGRADE PYELOGRAM, URETEROSCOPY AND STENT PLACEMENT Right 06/09/2015   Procedure: CYSTOSCOPY WITH RIGHT RETROGRADE PYELOGRAM, RIGHT URETEROSCOPY AND RIGHT URTERAL STENT PLACEMENT;  Surgeon: Ardis Hughs, MD;  Location: [facility];  Service: Urology;  Laterality: Right;  . ESOPHAGOGASTRODUODENOSCOPY    . HERNIA REPAIR    . HOLMIUM LASER APPLICATION Right 1/61/0960   Procedure: HOLMIUM LASER APPLICATION;  Surgeon: Ardis Hughs, MD;  Location: [facility];  Service: Urology;  Laterality: Right;  . LATISSIMUS FLAP TO BREAST Left 09/08/2014   Procedure: LEFT LATISSIMUS FLAP TO BREAST WITH SALINE IMPLANT FOR  BREAST RECONSTRUCTION;  Surgeon: Crissie Reese, MD;  Location: [place];  Service: Plastics;  Laterality: Left;  . LUMBAR FUSION  01/2018   L5-S1 transitional segmental laminectomy and Fusion  . MASTECTOMY Right 1996    chemotherapy. pt. states 13 lymph nodes were removed  . MASTECTOMY COMPLETE / SIMPLE W/ SENTINEL NODE BIOPSY Left 07/28/2014  . MASTECTOMY W/ SENTINEL NODE BIOPSY Left 07/28/2014   Procedure: LEFT MASTECTOMY WITH SENTINEL LYMPH NODE MAPPING;  Surgeon: Autumn Messing III, MD;  Location: [place];  Service: General;  Laterality: Left;  Marland Kitchen MELANOMA EXCISION Right 2010   From elbow-- [facility] at [facility]   . NISSEN FUNDOPLICATION  45/4098  . RECONSTRUCTION BREAST IMMEDIATE / DELAYED W/ TISSUE EXPANDER Left 07/28/2014  . TEMPOROMANDIBULAR JOINT SURGERY Bilateral 1987  . TONSILLECTOMY     Family History  Problem Relation Age of Onset  . Colon cancer Cousin 22       double first cousin  . Colon cancer Cousin 60  double first cousin  . Colon polyps Father        between 76-20  . Dementia Father   . CAD Father        CABG at age 74  . Stroke Mother   . Pancreatic cancer Brother 48  . Colon polyps Brother        between 10-20  . Cancer Paternal Uncle        NOS  . Anemia Paternal Grandfather        pernicious anemia  . Breast cancer Cousin 32       double first cousin  . Breast cancer Cousin 74       paternal cousin  . Stroke Other        F 1st degree relative 71, M 1st degree relative  . Esophageal cancer Neg Hx   . Stomach cancer Neg Hx    Social History   Socioeconomic History  . Marital status: Married    Spouse name: Not on file  . Number of children: 2  . Years of education: Not on file  . Highest education level: Not on file  Occupational History  . Occupation: Freight forwarder  Tobacco Use  . Smoking status: Never Smoker  . Smokeless tobacco: Never Used  . Tobacco comment: Regular Exercise - Yes  Vaping Use  . Vaping Use: Never used  Substance and Sexual Activity  . Alcohol  use: No  . Drug use: No  . Sexual activity: Yes  Other Topics Concern  . Not on file  Social History Narrative   She lives with husband.     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 [place] Strain: [place]   . Difficulty of Paying Living Expenses: Not hard at all  Food Insecurity: No Food Insecurity  . Worried About Charity fundraiser in the Last Year: Never true  . Ran Out of Food in the Last Year: Never true  Transportation Needs: No Transportation Needs  . Lack of Transportation (Medical): No  . Lack of Transportation (Non-Medical): No  Physical Activity: Inactive  . Days of Exercise per Week: 0 days  . Minutes of Exercise per Session: 0 min  Stress: No Stress Concern Present  . Feeling of Stress : Not at all  Social Connections: Socially Integrated  . Frequency of Communication with Friends and Family: More than three times a week  . Frequency of Social Gatherings with Friends and Family: More than three times a week  . Attends Religious Services: More than 4 times per year  . Active Member of Clubs or Organizations: Yes  . Attends Archivist Meetings: More than 4 times per year  . Marital Status: Married    Tobacco Counseling Counseling given: Not Answered Comment: Regular Exercise - Yes   Clinical Intake:  Pre-visit preparation completed: Yes  Pain : No/denies pain     Nutritional Status: BMI > 30  Obese Nutritional Risks: None Diabetes: No  How often do you need to have someone help you when you read instructions, pamphlets, or other written materials from your doctor or pharmacy?: 1 - Never What is the last grade level you completed in school?: 12th grade  Diabetic?No     Information entered by :: Caroleen Hamman LPN   Activities of Daily Living In your present state of health, do you have any difficulty performing the following  activities:  02/28/2020 02/28/2020  Hearing? N N  Vision? N N  Difficulty concentrating or making decisions? N N  Walking or climbing stairs? N N  Dressing or bathing? N N  Doing errands, shopping? N N  Preparing Food and eating ? N -  Using the Toilet? N -  In the past six months, have you accidently leaked urine? N -  Do you have problems with loss of bowel control? N -  Managing your Medications? N -  Managing your Finances? N -  Housekeeping or managing your Housekeeping? N -  Some recent data might be hidden    Patient Care Team: Delorse Limber as PCP - General (Physician Assistant) Josue Hector, MD (Cardiology) Cheri Fowler, MD as Attending Physician (Obstetrics and Gynecology) Magrinat, Virgie Dad, MD (Hematology and Oncology) Gatha Mayer, MD as Consulting Physician (Gastroenterology) Iona Beard, Ruth as Referring Physician (Optometry) Jovita Kussmaul, MD as Consulting Physician (General Surgery) Ardis Hughs, MD as Attending Physician (Urology) Charolotte Capuchin, MD as Consulting Physician (Dentistry) Ivan Croft (Spine Surgery) Irene Limbo, MD as Consulting Physician (Plastic Surgery) Madelin Rear, [facility] as Pharmacist (Pharmacist)  Indicate any recent Medical Services you may have received from other than [facility] providers in the past year (date may be approximate).     Assessment:   This is a routine wellness examination for Kirston.  Hearing/Vision screen  Hearing Screening   125Hz  250Hz  500Hz  1000Hz  2000Hz  3000Hz  4000Hz  6000Hz  8000Hz   Right ear:           Left ear:           Comments: No issues  Vision Screening Comments: Wears glasses Last eye exam-11/2019-Dr. Sharen Counter  Dietary issues and exercise activities discussed: Current Exercise Habits: The patient does not participate in regular exercise at present, Exercise limited by: None identified  Goals      Patient Stated   .  Travel more and visit with family.   (pt-stated)      Other   .  BP  <140/90      CARE PLAN ENTRY Current Barriers:  . Current blood pressure regimen: o Furosemide 40 mg once daily for fluid retention  BP Readings from Last 3 Encounters:  09/28/19 (!) 147/76  08/26/19 136/84  02/17/19 118/78 .  Current home BP readings:  o 8/12: 140/80, 143/80 on repeat. o 5/4: 152/81,147/76 on repeat.  Pharmacist Clinical Goal(s):  Marland Kitchen Over the next 180 days, patient will work with PharmD and providers to optimize antihypertensive regimen o BP <140/90 goal Interventions: . Inter-disciplinary care team collaboration (see longitudinal plan of care) . Comprehensive medication review performed; medication list updated in the electronic medical record.  . Implement blood pressure monitoring plan.  Patient Self Care Activities:  . Patient will check BP multiple times throughout the day, document, and provide at future appointments. . Patient will focus on medication adherence by continuing current medication management.  Initial goal documentation.     .  Increase physical activity      Has silver sneakers. Plans to use this year.   Walking and water aerobics      .  LDL Cholesterol <100      CARE PLAN ENTRY Current Barriers:  . Current antihyperlipidemic regimen: diet/exercise. Marland Kitchen Previous antihyperlipidemic medications tried o Atorvastatin 10 mg daily . Most recent lipid panel:     Component Value Date/Time   CHOL 197 02/17/2019 1015   TRIG 116.0 02/17/2019 1015   HDL 40.50 02/17/2019  1015   CHOLHDL 5 02/17/2019 1015   VLDL 23.2 02/17/2019 1015   LDLCALC 133 (H) 02/17/2019 1015   LDLDIRECT 179.7 02/10/2012 0920  Pharmacist Clinical Goal(s):  Marland Kitchen Over the next 180 days, patient will work with PharmD and providers towards optimized antihyperlipidemic therapy Interventions: . Comprehensive medication review performed; medication list updated in electronic medical record.  [name] care team collaboration  Patient Self Care Activities:  . Patient  will focus on medication adherence by continuing current management and going for routine labs.  Initial goal documentation.    .  Pain: Minimize recurring symptoms      CARE PLAN ENTRY Current Barriers:  . Chronic Disease Management support, education, and care coordination needs related to pain/arthritis.  Pharmacist Clinical Goal(s):  Marland Kitchen Over next 180 days: Minimize recurring pain.  Interventions: . Comprehensive medication review performed. . Stop naproxen, start tylenol extra strength 500 mg two tabs every 8 hours.  Patient Self Care Activities:  . Patient verbalizes understanding of plan to stop naproxen, start tylenol extra strength 500 mg two tabs every 8 hours over next 180 days.  . Please call with any questions!  Initial goal documentation.      Depression Screen PHQ 2/9 Scores 02/28/2020 01/24/2020 01/06/2020 08/26/2019 08/26/2019 02/17/2019 04/07/2018  PHQ - 2 Score 0 1 0 0 0 2 0  PHQ- 9 Score - 1 - 0 0 2 0    Fall Risk Fall Risk  02/28/2020 08/26/2019 02/17/2019 02/17/2019 01/29/2018  Falls in the past year? 0 0 0 0 No  Number falls in past yr: 0 0 0 0 -  Injury with Fall? 0 0 0 0 -  Risk Factor Category  - - - - -  Follow up Falls prevention discussed Falls evaluation completed Falls evaluation completed Falls evaluation completed -    Any stairs in or around the home? Yes  If so, are there any without handrails? No  Home free of loose throw rugs in walkways, pet beds, electrical cords, etc? Yes  Adequate lighting in your home to reduce risk of falls? Yes   ASSISTIVE DEVICES UTILIZED TO PREVENT FALLS:  Life alert? No  Use of a cane, walker or w/c? No  Grab bars in the bathroom? Yes  Shower chair or bench in shower? No  Elevated toilet seat or a handicapped toilet? No   TIMED UP AND GO:  Was the test performed? No . Phone visit   Cognitive Function:No cognitive impairment noted.  MMSE - Mini Mental State Exam 02/17/2019 01/29/2018 04/06/2015  Orientation to time 5  5 5   Orientation to Place 5 5 5   Registration 3 3 3   Attention/ Calculation 5 5 5   Recall 3 2 3   Language- name 2 objects 2 2 2   Language- repeat 1 1 1   Language- follow 3 step command 3 3 3   Language- read & follow direction 1 1 1   Write a sentence 1 1 1   Copy design 1 1 1   Total score 30 29 30         Immunizations Immunization History  Administered Date(s) Administered  . Fluad Quad(high Dose 65+) 02/17/2019  . Influenza Split 02/06/2011, 02/10/2012  . Influenza Whole 04/14/2007  . Influenza, High Dose Seasonal PF 04/06/2015, 03/18/2017, 01/29/2018  . Influenza,inj,Quad PF,6+ Mos 07/29/2014, 02/20/2016  . Pneumococcal Conjugate-13 02/20/2016  . Pneumococcal Polysaccharide-23 07/29/2014  . Tdap 01/03/2013  . Tetanus 01/04/2008  . Zoster Recombinat (Shingrix) 02/14/2018, 06/02/2018    TDAP status: Up to date  Flu vaccine status:Due-patient plans to get vaccine soon  Pneumococcal vaccine status: Up to date   Covid-19 vaccine status: Declined, Education has been provided regarding the importance of this vaccine but patient still declined. Advised may receive this vaccine at local pharmacy or Health Dept.or vaccine clinic. Aware to provide a copy of the vaccination record if obtained from local pharmacy or Health Dept. Verbalized acceptance and understanding.  Qualifies for Shingles Vaccine? No   Zostavax completed No   Shingrix Completed?: Yes  Screening Tests Health Maintenance  Topic Date Due  . DTAP VACCINES (1) 03/29/1946  . COVID-19 Vaccine (1) Never done  . INFLUENZA VACCINE  12/26/2019  . COLONOSCOPY  06/27/2020  . DTaP/Tdap/Td (2 - Td or Tdap) 01/04/2023  . TETANUS/TDAP  01/04/2023  . DEXA SCAN  Completed  . Hepatitis C Screening  Completed  . PNA vac Low Risk Adult  Completed    Health Maintenance  Health Maintenance Due  Topic Date Due  . DTAP VACCINES (1) 03/29/1946  . COVID-19 Vaccine (1) Never done  . INFLUENZA VACCINE  12/26/2019     Colorectal cancer screening: Completed 06/28/2015. Repeat every 5 years   Mammogram status: No longer required.    Bone Density status: Completed 03/27/2018. Results reflect: Bone density results: OSTEOPENIA. Repeat every 2 years.  Lung Cancer Screening: (Low Dose CT Chest recommended if Age 28-80 years, 30 pack-year currently smoking OR have quit w/in 15years.) does not qualify.    Additional Screening:  Hepatitis C Screening:Completed 07/06/2018  Vision Screening: Recommended annual ophthalmology exams for early detection of glaucoma and other disorders of the eye. Is the patient up to date with their annual eye exam?  Yes  Who is the provider or what is the name of the office in which the patient attends annual eye exams? Dr. Sharen Counter   Dental Screening: Recommended annual dental exams for proper oral hygiene  Community Resource Referral / Chronic Care Management: CRR required this visit?  No   CCM required this visit?  No      Plan:     I have personally reviewed and noted the following in the patient's chart:   . Medical and social history . Use of alcohol, tobacco or illicit drugs  . Current medications and supplements . Functional ability and status . Nutritional status . Physical activity . Advanced directives . List of other physicians . Hospitalizations, surgeries, and ER visits in previous 12 months . Vitals . Screenings to include cognitive, depression, and falls . Referrals and appointments  In addition, I have reviewed and discussed with patient certain preventive protocols, quality metrics, and best practice recommendations. A written personalized care plan for preventive services as well as general preventive health recommendations were provided to patient.    Due to this being a telephonic visit, the after visit summary with patients personalized plan was offered to patient via mail or my-chart. Patient would like to access on my-chart.    Marta Antu, LPN   02/26/7252  Nurse Health Advisor  Nurse Notes: None

## 2020-02-28 ENCOUNTER — Ambulatory Visit (INDEPENDENT_AMBULATORY_CARE_PROVIDER_SITE_OTHER): Payer: Medicare HMO

## 2020-02-28 ENCOUNTER — Encounter: Payer: Self-pay | Admitting: Physician Assistant

## 2020-02-28 ENCOUNTER — Telehealth (INDEPENDENT_AMBULATORY_CARE_PROVIDER_SITE_OTHER): Payer: Medicare HMO | Admitting: Physician Assistant

## 2020-02-28 ENCOUNTER — Ambulatory Visit: Payer: Medicare HMO

## 2020-02-28 VITALS — Ht 66.0 in | Wt 186.0 lb

## 2020-02-28 DIAGNOSIS — J208 Acute bronchitis due to other specified organisms: Secondary | ICD-10-CM

## 2020-02-28 DIAGNOSIS — B9689 Other specified bacterial agents as the cause of diseases classified elsewhere: Secondary | ICD-10-CM

## 2020-02-28 DIAGNOSIS — Z0000 Encounter for general adult medical examination without abnormal findings: Secondary | ICD-10-CM

## 2020-02-28 MED ORDER — DOXYCYCLINE HYCLATE 100 MG PO CAPS: 100.0000 mg | ORAL_CAPSULE | Freq: Two times a day (BID) | ORAL | 0 refills | Status: DC

## 2020-02-28 NOTE — Patient Instructions (Signed)
[name] , Thank you for taking time to complete your Medicare Wellness Visit. I appreciate your ongoing commitment to your health goals. Please review the following plan we discussed and let me know if I can assist you in the future.   Screening recommendations/referrals: Colonoscopy: Completed 06/28/2015-Due 06/27/2020 Mammogram: No longer required-Mastectomy Bone Density: Completed-03/27/2018-Due 05/28/2019 Recommended yearly ophthalmology/optometry visit for glaucoma screening and checkup Recommended yearly dental visit for hygiene and checkup  Vaccinations: Influenza vaccine: Due Pneumococcal vaccine: Completed vaccines Tdap vaccine: Up to Date- Due-01/04/2023 Shingles vaccine: Completed vaccines Covid-19:Declined vacicnes  Advanced directives: Copy on file  Conditions/risks identified: See problem list  Next appointment: Follow up in one year for your annual wellness visit    Preventive Care 74 Years and Older, Female Preventive care refers to lifestyle choices and visits with your health care provider that can promote health and wellness. What does preventive care include?  A yearly physical exam. This is also called an annual well check.  Dental exams once or twice a year.  Routine eye exams. Ask your health care provider how often you should have your eyes checked.  Personal lifestyle choices, including:  Daily care of your teeth and gums.  Regular physical activity.  Eating a healthy diet.  Avoiding tobacco and drug use.  Limiting alcohol use.  Practicing safe sex.  Taking low-dose aspirin every day.  Taking vitamin and mineral supplements as recommended by your health care provider. What happens during an annual well check? The services and screenings done by your health care provider during your annual well check will depend on your age, overall health, lifestyle risk factors, and family history of disease. Counseling  Your health care provider may ask you  questions about your:  Alcohol use.  Tobacco use.  Drug use.  Emotional well-being.  Home and relationship well-being.  Sexual activity.  Eating habits.  History of falls.  Memory and ability to understand (cognition).  Work and work Statistician.  Reproductive health. Screening  You may have the following tests or measurements:  Height, weight, and BMI.  Blood pressure.  Lipid and cholesterol levels. These may be checked every 5 years, or more frequently if you are over 42 years old.  Skin check.  Lung cancer screening. You may have this screening every year starting at age 108 if you have a 30-pack-year history of smoking and currently smoke or have quit within the past 15 years.  Fecal occult blood test (FOBT) of the stool. You may have this test every year starting at age 6.  Flexible sigmoidoscopy or colonoscopy. You may have a sigmoidoscopy every 5 years or a colonoscopy every 10 years starting at age 30.  Hepatitis C blood test.  Hepatitis B blood test.  Sexually transmitted disease (STD) testing.  Diabetes screening. This is done by checking your blood sugar (glucose) after you have not eaten for a while (fasting). You may have this done every 1-3 years.  Bone density scan. This is done to screen for osteoporosis. You may have this done starting at age 69.  Mammogram. This may be done every 1-2 years. Talk to your health care provider about how often you should have regular mammograms. Talk with your health care provider about your test results, treatment options, and if necessary, the need for more tests. Vaccines  Your health care provider may recommend certain vaccines, such as:  Influenza vaccine. This is recommended every year.  Tetanus, diphtheria, and acellular pertussis (Tdap, Td) vaccine. You may need  a Td booster every 10 years.  Zoster vaccine. You may need this after age 18.  Pneumococcal 13-valent conjugate (PCV13) vaccine. One dose is  recommended after age 69.  Pneumococcal polysaccharide (PPSV23) vaccine. One dose is recommended after age 46. Talk to your health care provider about which screenings and vaccines you need and how often you need them. This information is not intended to replace advice given to you by your health care provider. Make sure you discuss any questions you have with your health care provider. Document Released: 06/09/2015 Document Revised: 01/31/2016 Document Reviewed: 03/14/2015 Elsevier Interactive Patient Education  2017 [place] Prevention in the Home Falls can cause injuries. They can happen to people of all ages. There are many things you can do to make your home safe and to help prevent falls. What can I do on the outside of my home?  Regularly fix the edges of walkways and driveways and fix any cracks.  Remove anything that might make you trip as you walk through a door, such as a raised step or threshold.  Trim any bushes or trees on the path to your home.  Use bright outdoor lighting.  Clear any walking paths of anything that might make someone trip, such as rocks or tools.  Regularly check to see if handrails are loose or broken. Make sure that both sides of any steps have handrails.  Any raised decks and porches should have guardrails on the edges.  Have any leaves, snow, or ice cleared regularly.  Use sand or salt on walking paths during winter.  Clean up any spills in your garage right away. This includes oil or grease spills. What can I do in the bathroom?  Use night lights.  Install grab bars by the toilet and in the tub and shower. Do not use towel bars as grab bars.  Use non-skid mats or decals in the tub or shower.  If you need to sit down in the shower, use a plastic, non-slip stool.  Keep the floor dry. Clean up any water that spills on the floor as soon as it happens.  Remove soap buildup in the tub or shower regularly.  Attach bath mats  securely with double-sided non-slip rug tape.  Do not have throw rugs and other things on the floor that can make you trip. What can I do in the bedroom?  Use night lights.  Make sure that you have a light by your bed that is easy to reach.  Do not use any sheets or blankets that are too big for your bed. They should not hang down onto the floor.  Have a firm chair that has side arms. You can use this for support while you get dressed.  Do not have throw rugs and other things on the floor that can make you trip. What can I do in the kitchen?  Clean up any spills right away.  Avoid walking on wet floors.  Keep items that you use a lot in easy-to-reach places.  If you need to reach something above you, use a strong step stool that has a grab bar.  Keep electrical cords out of the way.  Do not use floor polish or wax that makes floors slippery. If you must use wax, use non-skid floor wax.  Do not have throw rugs and other things on the floor that can make you trip. What can I do with my stairs?  Do not leave any items on the  stairs.  Make sure that there are handrails on both sides of the stairs and use them. Fix handrails that are broken or loose. Make sure that handrails are as long as the stairways.  Check any carpeting to make sure that it is firmly attached to the stairs. Fix any carpet that is loose or worn.  Avoid having throw rugs at the top or bottom of the stairs. If you do have throw rugs, attach them to the floor with carpet tape.  Make sure that you have a light switch at the top of the stairs and the bottom of the stairs. If you do not have them, ask someone to add them for you. What else can I do to help prevent falls?  Wear shoes that:  Do not have high heels.  Have rubber bottoms.  Are comfortable and fit you well.  Are closed at the toe. Do not wear sandals.  If you use a stepladder:  Make sure that it is fully opened. Do not climb a closed  stepladder.  Make sure that both sides of the stepladder are locked into place.  Ask someone to hold it for you, if possible.  Clearly mark and make sure that you can see:  Any grab bars or handrails.  First and last steps.  Where the edge of each step is.  Use tools that help you move around (mobility aids) if they are needed. These include:  Canes.  Walkers.  Scooters.  Crutches.  Turn on the lights when you go into a dark area. Replace any light bulbs as soon as they burn out.  Set up your furniture so you have a clear path. Avoid moving your furniture around.  If any of your floors are uneven, fix them.  If there are any pets around you, be aware of where they are.  Review your medicines with your doctor. Some medicines can make you feel dizzy. This can increase your chance of falling. Ask your doctor what other things that you can do to help prevent falls. This information is not intended to replace advice given to you by your health care provider. Make sure you discuss any questions you have with your health care provider. Document Released: 03/09/2009 Document Revised: 10/19/2015 Document Reviewed: 06/17/2014 Elsevier Interactive Patient Education  2017 [employer].

## 2020-02-28 NOTE — Addendum Note (Signed)
Addended by: Doran Clay A on: 02/28/2020 11:10 AM   Modules accepted: Orders

## 2020-02-28 NOTE — Patient Instructions (Signed)
Instructions sent to patients MyChart.

## 2020-02-28 NOTE — Progress Notes (Signed)
Virtual Visit via Video   I connected with patient on 02/28/20 at 10:30 AM EDT by a video enabled telemedicine application and verified that I am speaking with the correct person using two identifiers.  Location patient: Home Location provider: [facility], Office Persons participating in the virtual visit: Patient, Provider, [place] ([name])  I discussed the limitations of evaluation and management by telemedicine and the availability of in person appointments. The patient expressed understanding and agreed to proceed.  Subjective:   HPI:   Patient presents via [facility] today c/o 2 weeks of chest congestion, nasal congestion, PND and cough that was initially dry but has become productive of thick yellow-green sputum over the past 2 days. Has been having sore throat, PND, rhinorrhea. Having voice hoarseness 2/2 drainage.  Denies sinus pain, ear pain or tooth pain.  Denies chest pain, chest tightness or shortness of breath.  Denies fever, chills or aches.  Denies loss of taste or smell.  Did get Covid tested x2 which were negative.  Has been keeping at home while feeling under the weather.  ROS:   See pertinent positives and negatives per HPI.  Patient Active Problem List   Diagnosis Date Noted  . Rheumatoid arthritis ([place]) 11/02/2018  . Episode of recurrent major depressive disorder ([place]) 12/05/2017  . Primary osteoarthritis of both hands 07/09/2017  . Primary osteoarthritis of both knees 07/09/2017  . DDD (degenerative disc disease), cervical 07/09/2017  . DDD (degenerative disc disease), lumbar 07/09/2017  . Peripheral edema 11/26/2016  . Medicare annual wellness visit, subsequent 06/30/2016  . Hx of adenomatous polyp of colon 07/03/2015  . 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 Family history of breast cancer   . Family history of colon cancer   . Family history of pancreatic cancer   . Malignant neoplasm of upper-outer quadrant of left breast in  female, estrogen receptor positive ([place]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  . Peripheral neuropathy 01/01/2013  . Melanoma of upper arm ([place]) 02/10/2012  . S/P laparoscopic fundoplication 25/36/6440  . Steatosis of liver 12/07/2009  . COAGULOPATHY 07/12/2008  . CHEST XRAY, ABNORMAL 01/04/2008  . Vitamin D deficiency 04/14/2007  . VITAMIN B12 DEFICIENCY 02/27/2007  . Essential hypertension 02/27/2007  . GERD 02/27/2007  . Fibromyalgia 02/27/2007    Social History   Tobacco Use  . Smoking status: Never Smoker  . Smokeless tobacco: Never Used  . Tobacco comment: Regular Exercise - Yes  Substance Use Topics  . Alcohol use: No    Current Outpatient Medications:  .  Acetaminophen (TYLENOL EXTRA STRENGTH PO), Take 1,000 mg by mouth every 8 (eight) hours as needed., Disp: , Rfl:  .  Biotin 10000 MCG TABS, Take 10,000 mcg by mouth daily., Disp: , Rfl:  .  cetirizine (ZYRTEC ALLERGY) 10 MG tablet, Take 10 mg by mouth as needed. , Disp: , Rfl:  .  Cholecalciferol (VITAMIN D3 PO), Take 6,000 Units by mouth daily. , Disp: , Rfl:  .  citalopram (CELEXA) 20 MG tablet, Take 1 tablet (20 mg total) by mouth daily., Disp: 90 tablet, Rfl: 1 .  cyanocobalamin 2000 MCG tablet, Take 1.5 tablets (3,000 mcg total) by mouth daily., Disp: 90 tablet, Rfl:  .  folic acid (FOLVITE) 1 MG tablet, Take 2 tablets (2 mg total) by mouth daily. (Patient not taking: Reported on 09/28/2019), Disp: 180 tablet, Rfl: 3 .  naproxen sodium (ALEVE) 220 MG tablet, Take 220 mg by mouth as needed., Disp: , Rfl:  .  pregabalin (LYRICA) 75  MG capsule, Take 1 capsule (75 mg total) by mouth 2 (two) times daily., Disp: 60 capsule, Rfl: 1 .  vitamin E 400 UNIT capsule, Take 400 Units by mouth daily. 800 mg daily, Disp: , Rfl:  .  zinc gluconate 50 MG tablet, Take 50 mg by mouth daily., Disp: , Rfl:   Allergies  Allergen Reactions  . Other Anaphylaxis and Rash  . Silodosin Rash    Facial rash  . Ace Inhibitors Other (See Comments)    REACTION:  angioedema  . Buprenorphine Hcl Other (See Comments)    "crazy"  . Morphine And Related Other (See Comments)    "crazy" "crazy"  . Codeine Rash  . Sulfonamide Derivatives Rash  . Telmisartan Rash    Objective:   There were no vitals taken for this visit.  Patient is well-developed, well-nourished in no acute distress.  Resting comfortably at home.  Head is normocephalic, atraumatic.  No labored breathing.  Speech is clear and coherent with logical content.  Patient is alert and oriented at baseline.   Assessment and Plan:   1. Acute bacterial bronchitis Rx Doxycycline.  Increase fluids.  Rest.  Saline nasal spray.  Probiotic.  Mucinex as directed.  Humidifier in bedroom. Continue allergy medications.  Call or return to clinic if symptoms are not improving.     Leeanne Rio, PA-C 02/28/2020

## 2020-03-09 DIAGNOSIS — D485 Neoplasm of uncertain behavior of skin: Secondary | ICD-10-CM | POA: Diagnosis not present

## 2020-03-09 DIAGNOSIS — Z85820 Personal history of malignant melanoma of skin: Secondary | ICD-10-CM | POA: Diagnosis not present

## 2020-03-09 DIAGNOSIS — L570 Actinic keratosis: Secondary | ICD-10-CM | POA: Diagnosis not present

## 2020-03-09 DIAGNOSIS — L821 Other seborrheic keratosis: Secondary | ICD-10-CM | POA: Diagnosis not present

## 2020-03-10 ENCOUNTER — Telehealth: Payer: Self-pay | Admitting: Physician Assistant

## 2020-03-10 NOTE — Telephone Encounter (Signed)
Pt called in stating that she sprained her ankle a few mths ago, she wanted to know if Einar Pheasant could put in an order to have an xray of the left ankle at [facility]/ please advise

## 2020-03-10 NOTE — Telephone Encounter (Signed)
Would you like for me to set this patient up with an appointment for assessment. I see in her notes where she has asked for an xray in the past.

## 2020-03-10 NOTE — Telephone Encounter (Signed)
Giving prior discussion, ok to proceed with order for x-ray of L foot and ankle at [facility].

## 2020-03-13 ENCOUNTER — Other Ambulatory Visit: Payer: Self-pay

## 2020-03-13 ENCOUNTER — Ambulatory Visit (HOSPITAL_BASED_OUTPATIENT_CLINIC_OR_DEPARTMENT_OTHER)
Admission: RE | Admit: 2020-03-13 | Discharge: 2020-03-13 | Disposition: A | Discharge status: Home / Self Care | Payer: Medicare HMO | Source: Ambulatory Visit | Attending: Physician Assistant | Admitting: Physician Assistant

## 2020-03-13 DIAGNOSIS — M79672 Pain in left foot: Secondary | ICD-10-CM | POA: Diagnosis not present

## 2020-03-13 DIAGNOSIS — M19072 Primary osteoarthritis, left ankle and foot: Secondary | ICD-10-CM | POA: Diagnosis not present

## 2020-03-13 NOTE — Telephone Encounter (Signed)
Orders placed for xray of left foot/ankle to [facility]. Patient notified and voiced understanding.

## 2020-03-14 ENCOUNTER — Other Ambulatory Visit: Payer: Self-pay | Admitting: General Practice

## 2020-03-14 DIAGNOSIS — M79672 Pain in left foot: Secondary | ICD-10-CM

## 2020-03-21 DIAGNOSIS — C50912 Malignant neoplasm of unspecified site of left female breast: Secondary | ICD-10-CM | POA: Diagnosis not present

## 2020-03-21 DIAGNOSIS — C50911 Malignant neoplasm of unspecified site of right female breast: Secondary | ICD-10-CM | POA: Diagnosis not present

## 2020-03-23 ENCOUNTER — Encounter: Payer: Self-pay | Admitting: Family Medicine

## 2020-03-23 ENCOUNTER — Ambulatory Visit: Payer: Self-pay

## 2020-03-23 ENCOUNTER — Ambulatory Visit: Payer: Medicare HMO | Admitting: Family Medicine

## 2020-03-23 ENCOUNTER — Other Ambulatory Visit: Payer: Self-pay

## 2020-03-23 VITALS — BP 135/87 | HR 80 | Ht 66.0 in | Wt 184.0 lb

## 2020-03-23 DIAGNOSIS — M25472 Effusion, left ankle: Secondary | ICD-10-CM

## 2020-03-23 DIAGNOSIS — M25572 Pain in left ankle and joints of left foot: Secondary | ICD-10-CM

## 2020-03-23 HISTORY — DX: Effusion, left ankle: M25.472

## 2020-03-23 MED ORDER — PREDNISONE 5 MG PO TABS: ORAL_TABLET | ORAL | 0 refills | Status: DC

## 2020-03-23 NOTE — Assessment & Plan Note (Signed)
Has an effusion since her injury.  May have an exacerbation of underlying degenerative changes.  Posterior tibialis and peroneal tendons look good.  No significant changes of the Achilles.  Concern for possible change of the medial gastrocnemius.  ATFL looks good. -Counseled on home exercise therapy and supportive care. -Prednisone. -Provided heel lifts. -Could consider a cam walker if symptoms are not improving versus injection of ankle joint.

## 2020-03-23 NOTE — Patient Instructions (Signed)
Nice to meet you  Please use ice  Please try the exercises  Please try the medicine  Please send me a message in MyChart with any questions or updates.  Please see me back in 2-3 weeks.   --Dr. Raeford Razor

## 2020-03-23 NOTE — Progress Notes (Signed)
[name] - 74 y.o. female MRN [id number]  Date of birth: 09-29-1945  SUBJECTIVE:  Including CC & ROS.  Chief Complaint  Patient presents with  . Ankle Pain    left x 2 months    [name] is a 74 y.o. female that is presenting with left ankle pain.  She had an inversion injury and has had ongoing pain.  She has pain over the posterior aspect as well as anterior aspect of the ankle joint.  No history of similar pain or surgery.  No improvement with modalities today..  Independent review of the left ankle x-ray from 10/18 shows no acute changes.  Independent review of the left foot x-ray from 10/18 shows a calcification near the insertion of the peroneal brevis which is likely distant injury.  Is also showing stable postsurgical hardware.   Review of Systems See HPI   HISTORY: Past Medical, Surgical, Social, and Family History Reviewed & Updated per EMR.   Pertinent Historical Findings include:  Past Medical History:  Diagnosis Date  . Arthritis    "spine" (07/28/2014)  . Blood dyscrasia    bruises and bleed easily  . Breast cancer, left breast ([place]) 07/28/2014  . Breast cancer, right breast ([place]) 1995  . Chronic back pain    "all over"  . Complication of anesthesia    went to sleep easily but hard to wake up up until elbow OR in 2010  . Depression   . Dyspnea    Normal Spirometry 03/2008 EF 65% BNP normal 11/2007  . Family history of breast cancer   . Family history of colon cancer   . Family history of pancreatic cancer   . Gastric polyps   . GERD (gastroesophageal reflux disease)   . History of chicken pox   . History of hiatal hernia   . Hx of adenomatous polyp of colon 07/03/2015  . Kidney stone    right kidney   . Lichen sclerosus   . Melanoma ([place]) 2010   "right elbow; treated at [facility]"  . Mild anxiety   . Vitamin B12 deficiency   . Vitamin D deficiency     Past Surgical History:  Procedure Laterality Date  . BREAST BIOPSY Left 04/2014  . BREAST  RECONSTRUCTION WITH PLACEMENT OF TISSUE EXPANDER AND FLEX HD (ACELLULAR HYDRATED DERMIS) Left 07/28/2014   Procedure: LEFT BREAST RECONSTRUCTION PLACEMENT OF LEFT TISSUE EXPANDER ;  Surgeon: Crissie Reese, MD;  Location: [facility];  Service: Plastics;  Laterality: Left;  . BREAST RECONSTRUCTION WITH PLACEMENT OF TISSUE EXPANDER AND FLEX HD (ACELLULAR HYDRATED DERMIS) Left 09/08/2014   Procedure: REMOVAL OF TISSUE EXPANDER FROM LEFT BREAST;  Surgeon: Crissie Reese, MD;  Location: [facility];  Service: Plastics;  Laterality: Left;  . BUNIONECTOMY Bilateral 1970's  . COLONOSCOPY     Dr Sharlett Iles  . CYSTOSCOPY WITH RETROGRADE PYELOGRAM, URETEROSCOPY AND STENT PLACEMENT Right 06/09/2015   Procedure: CYSTOSCOPY WITH RIGHT RETROGRADE PYELOGRAM, RIGHT URETEROSCOPY AND RIGHT URTERAL STENT PLACEMENT;  Surgeon: Ardis Hughs, MD;  Location: [facility];  Service: Urology;  Laterality: Right;  . ESOPHAGOGASTRODUODENOSCOPY    . HERNIA REPAIR    . HOLMIUM LASER APPLICATION Right 0/35/0093   Procedure: HOLMIUM LASER APPLICATION;  Surgeon: Ardis Hughs, MD;  Location: [facility];  Service: Urology;  Laterality: Right;  . LATISSIMUS FLAP TO BREAST Left 09/08/2014   Procedure: LEFT LATISSIMUS FLAP TO BREAST WITH SALINE IMPLANT FOR BREAST RECONSTRUCTION;  Surgeon: Crissie Reese, MD;  Location: [place];  Service: Clinical cytogeneticist;  Laterality: Left;  . LUMBAR FUSION  01/2018   L5-S1 transitional segmental laminectomy and Fusion  . MASTECTOMY Right 1996    chemotherapy. pt. states 13 lymph nodes were removed  . MASTECTOMY COMPLETE / SIMPLE W/ SENTINEL NODE BIOPSY Left 07/28/2014  . MASTECTOMY W/ SENTINEL NODE BIOPSY Left 07/28/2014   Procedure: LEFT MASTECTOMY WITH SENTINEL LYMPH NODE MAPPING;  Surgeon: Autumn Messing III, MD;  Location: [place];  Service: General;  Laterality: Left;  Marland Kitchen MELANOMA EXCISION Right 2010   From elbow-- Done at [facility]   . NISSEN FUNDOPLICATION  07/5007  . RECONSTRUCTION BREAST IMMEDIATE / DELAYED W/ TISSUE EXPANDER Left  07/28/2014  . TEMPOROMANDIBULAR JOINT SURGERY Bilateral 1987  . TONSILLECTOMY      Family History  Problem Relation Age of Onset  . Colon cancer Cousin 65       double first cousin  . Colon cancer Cousin 76       double first cousin  . Colon polyps Father        between 59-20  . Dementia Father   . CAD Father        CABG at age 88  . Stroke Mother   . Pancreatic cancer Brother 69  . Colon polyps Brother        between 10-20  . Cancer Paternal Uncle        NOS  . Anemia Paternal Grandfather        pernicious anemia  . Breast cancer Cousin 34       double first cousin  . Breast cancer Cousin 74       paternal cousin  . Stroke Other        F 1st degree relative 29, M 1st degree relative  . Esophageal cancer Neg Hx   . Stomach cancer Neg Hx     Social History   Socioeconomic History  . Marital status: Married    Spouse name: Not on file  . Number of children: 2  . Years of education: Not on file  . Highest education level: Not on file  Occupational History  . Occupation: Freight forwarder  Tobacco Use  . Smoking status: Never Smoker  . Smokeless tobacco: Never Used  . Tobacco comment: Regular Exercise - Yes  Vaping Use  . Vaping Use: Never used  Substance and Sexual Activity  . Alcohol use: No  . Drug use: No  . Sexual activity: Yes  Other Topics Concern  . Not on file  Social History Narrative   She lives with husband.     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 [place] Strain: [place]   . Difficulty of Paying Living Expenses: Not hard at all  Food Insecurity: No Food Insecurity  . Worried About Charity fundraiser in the Last Year: Never true  . Ran Out of Food in the Last Year: Never true  Transportation Needs: No Transportation Needs  . Lack of Transportation (Medical): No  . Lack of Transportation (Non-Medical): No  Physical Activity: Inactive  . Days of  Exercise per Week: 0 days  . Minutes of Exercise per Session: 0 min  Stress: No Stress Concern Present  . Feeling of Stress : Not at all  Social Connections: Socially Integrated  . Frequency of Communication with Friends and Family: More than three times a week  . Frequency of Social  Gatherings with Friends and Family: More than three times a week  . Attends Religious Services: More than 4 times per year  . Active Member of Clubs or Organizations: Yes  . Attends Archivist Meetings: More than 4 times per year  . Marital Status: Married  Human resources officer Violence: Not At Risk  . Fear of Current or Ex-Partner: No  . Emotionally Abused: No  . Physically Abused: No  . Sexually Abused: No     PHYSICAL EXAM:  VS: BP 135/87   Pulse 80   Ht 5\' 6"  (1.676 m)   Wt 184 lb (83.5 kg)   BMI 29.70 kg/m  Physical Exam Gen: NAD, alert, cooperative with exam, well-appearing MSK:  Left ankle: No swelling or ecchymosis. Normal strength resistance. Limited plantar flexion dorsiflexion. Negative anterior drawer. Negative posterior drawer. Neurovascular intact  Limited ultrasound: Left ankle:  Effusion noted in the ankle joint. Normal-appearing posterior tibialis. Hyperemia appreciated the medial aspect of the hindfoot. Normal appearing insertion of the Achilles. No retrocalcaneal bursitis. There is hypoechoic change at the musculotendinous junction of the medial gastrocnemius to suggest a partial strain.  No hyperemia in this area to indicate acute changes. Normal-appearing peroneal tendons. ATFL with only mild hyperemia and no loss of structural integrity.  Summary: Findings would suggest ankle effusion is the predominant finding.  Ultrasound and interpretation by Clearance Coots, MD    ASSESSMENT & PLAN:   Ankle effusion, left Has an effusion since her injury.  May have an exacerbation of underlying degenerative changes.  Posterior tibialis and peroneal tendons look good.   No significant changes of the Achilles.  Concern for possible change of the medial gastrocnemius.  ATFL looks good. -Counseled on home exercise therapy and supportive care. -Prednisone. -Provided heel lifts. -Could consider a cam walker if symptoms are not improving versus injection of ankle joint.

## 2020-04-10 ENCOUNTER — Ambulatory Visit: Payer: Medicare HMO | Admitting: Family Medicine

## 2020-04-14 ENCOUNTER — Telehealth: Payer: Self-pay

## 2020-04-14 NOTE — Progress Notes (Signed)
Please void note, open in error.  Bessie Kellihan,CPA Clinical Pharmacist Assistant [phone]   

## 2020-04-18 ENCOUNTER — Telehealth: Payer: Self-pay | Admitting: Emergency Medicine

## 2020-04-18 ENCOUNTER — Telehealth: Payer: Self-pay

## 2020-04-18 DIAGNOSIS — R609 Edema, unspecified: Secondary | ICD-10-CM

## 2020-04-18 MED ORDER — FUROSEMIDE 40 MG PO TABS: 40.0000 mg | ORAL_TABLET | Freq: Every day | ORAL | 1 refills | Status: DC

## 2020-04-18 NOTE — Progress Notes (Addendum)
Chronic Care Management Pharmacy Assistant   Name: TEYANNA THIELMAN  MRN: [id number] DOB: 05/10/1946  Reason for Encounter: Medication Review  PCP : Brunetta Jeans, PA-C  Allergies:   Allergies  Allergen Reactions   Other Anaphylaxis and Rash   Silodosin Rash    Facial rash   Ace Inhibitors Other (See Comments)    REACTION: angioedema   Buprenorphine Hcl Other (See Comments)    "crazy"   Morphine And Related Other (See Comments)    "crazy" "crazy"   Codeine Rash   Sulfonamide Derivatives Rash   Telmisartan Rash    Medications: Outpatient Encounter Medications as of 04/18/2020  Medication Sig   Acetaminophen (TYLENOL EXTRA STRENGTH PO) Take 1,000 mg by mouth every 8 (eight) hours as needed.   Biotin 10000 MCG TABS Take 10,000 mcg by mouth daily.   cetirizine (ZYRTEC ALLERGY) 10 MG tablet Take 10 mg by mouth as needed.    Cholecalciferol (VITAMIN D3 PO) Take 6,000 Units by mouth daily.    citalopram (CELEXA) 20 MG tablet Take 1 tablet (20 mg total) by mouth daily.   cyanocobalamin 2000 MCG tablet Take 1.5 tablets (3,000 mcg total) by mouth daily.   doxycycline (VIBRAMYCIN) 100 MG capsule Take 1 capsule (100 mg total) by mouth 2 (two) times daily.   folic acid (FOLVITE) 1 MG tablet Take 2 tablets (2 mg total) by mouth daily.   naproxen sodium (ALEVE) 220 MG tablet Take 220 mg by mouth as needed.   predniSONE (DELTASONE) 5 MG tablet Take 6 pills for first day, 5 pills second day, 4 pills third day, 3 pills fourth day, 2 pills the fifth day, and 1 pill sixth day.   pregabalin (LYRICA) 75 MG capsule Take 1 capsule (75 mg total) by mouth 2 (two) times daily.   vitamin E 400 UNIT capsule Take 400 Units by mouth daily. 800 mg daily   zinc gluconate 50 MG tablet Take 50 mg by mouth daily.   No facility-administered encounter medications on file as of 04/18/2020.    Current Diagnosis: Patient Active Problem List   Diagnosis Date Noted   Ankle effusion, left 03/23/2020    Rheumatoid arthritis ([place]) 11/02/2018   Episode of recurrent major depressive disorder ([place]) 12/05/2017   Primary osteoarthritis of both hands 07/09/2017   Primary osteoarthritis of both knees 07/09/2017   DDD (degenerative disc disease), cervical 07/09/2017   DDD (degenerative disc disease), lumbar 07/09/2017   Peripheral edema 11/26/2016   Medicare annual wellness visit, subsequent 06/30/2016   Hx of adenomatous polyp of colon 61/44/3154   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   Family history of breast cancer    Family history of colon cancer    Family history of pancreatic cancer    Malignant neoplasm of upper-outer quadrant of left breast in female, estrogen receptor positive ([place]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   Peripheral neuropathy 01/01/2013   Melanoma of upper arm ([place]) 02/10/2012   S/P laparoscopic fundoplication 00/86/7619   Steatosis of liver 12/07/2009   COAGULOPATHY 07/12/2008   CHEST XRAY, ABNORMAL 01/04/2008   Vitamin D deficiency 04/14/2007   VITAMIN B12 DEFICIENCY 02/27/2007   Essential hypertension 02/27/2007   GERD 02/27/2007   Fibromyalgia 02/27/2007    Reviewed chart for medication changes ahead of medication coordination call.  No OVs, Consults, or hospital visits since last care coordination call/Pharmacist visit. (If appropriate, list visit date, provider name)  No medication changes indicated OR if recent visit, treatment plan here.  BP Readings from  Last 3 Encounters:  03/23/20 135/87  02/16/20 (!) 164/81  01/24/20 130/82    Lab Results  Component Value Date   HGBA1C 5.6 02/17/2019     Patient obtains medications through Adherence Packaging  30 Days   Patient is due for next adherence delivery on: 05-08-20. Called patient and reviewed medications and coordinated delivery.  This delivery to include:  Furosemide 40mg  Take one tab every morning  Citolapram 20mg  Take one tab every morning   Patient declined the following medications :   Pregablin  75mg  Cap- Take one capsule twice daily.      Patient states she is not taking this medication anymore.  Patient needs refills for:      Furosemide 40mg  Tablet Take one tab every morning (CPP to request) - requested  Confirmed delivery date of 05-08-20, advised patient that pharmacy will contact them the morning of delivery.   Patient originally had delivery date for 04-22-20. However patient stated she has 3 weeks left of medications and wanted to change delivery date to 05-08-20.  Georgiana Shore ,[place] Pharmacist Assistant [phone]   Follow-Up:  Pharmacist Review  Reviewed, furosemide Rx refill requested - jwp

## 2020-04-18 NOTE — Telephone Encounter (Signed)
-----   Message from Madelin Rear, [facility] sent at 04/18/2020 10:36 AM EST ----- Regarding: furosemide refill Hi [name],  Would you be able to send rx refill for furosemide 40 [facility] you [facility] please?  Thanks! [name]

## 2020-04-27 DIAGNOSIS — R69 Illness, unspecified: Secondary | ICD-10-CM | POA: Diagnosis not present

## 2020-05-07 DIAGNOSIS — L231 Allergic contact dermatitis due to adhesives: Secondary | ICD-10-CM | POA: Diagnosis not present

## 2020-05-08 ENCOUNTER — Telehealth: Payer: Self-pay

## 2020-05-08 ENCOUNTER — Ambulatory Visit: Payer: Medicare HMO | Admitting: Neurology

## 2020-05-08 DIAGNOSIS — Z23 Encounter for immunization: Secondary | ICD-10-CM | POA: Diagnosis not present

## 2020-05-08 DIAGNOSIS — T24032A Burn of unspecified degree of left lower leg, initial encounter: Secondary | ICD-10-CM | POA: Diagnosis not present

## 2020-05-08 DIAGNOSIS — T24031A Burn of unspecified degree of right lower leg, initial encounter: Secondary | ICD-10-CM | POA: Diagnosis not present

## 2020-05-08 DIAGNOSIS — X088XXA Exposure to other specified smoke, fire and flames, initial encounter: Secondary | ICD-10-CM | POA: Diagnosis not present

## 2020-05-08 NOTE — Telephone Encounter (Signed)
Nurse Assessment Nurse: Rock Nephew, RN, Juliann Pulse Date/Time (Eastern Time): 05/06/2020 8:26:40 AM Confirm and document reason for call. If symptomatic, describe symptoms. ---Caller was wanting to know office hours. She is having an allergic reaction. She says her face is swollen. Her eyes are almost swollen shut. she was handling Gorilla Glue yesterday and wonders if it is related to that. Does the patient have any new or worsening symptoms? ---Yes Will a triage be completed? ---Yes Related visit to physician within the last 2 weeks? ---No Does the PT have any chronic conditions? (i.e. diabetes, asthma, this includes High risk factors for pregnancy, etc.) ---No Is this a behavioral health or substance abuse call? ---No Guidelines Guideline Title Affirmed Question Affirmed Notes Nurse Date/Time Eilene Ghazi Time) Face Swelling SEVERE swelling of the entire face Wells, RN, Juliann Pulse 05/06/2020 8:26:54 AM Disp. Time Eilene Ghazi Time) Disposition Final User 05/06/2020 8:29:54 AM See HCP within 4 Hours (or PCP triage) Yes Rock Nephew, RN, Gara Kroner Disagree/Comply Comply PLEASE NOTE: All timestamps contained within this report are represented as [place] Standard Time. CONFIDENTIALTY NOTICE: This fax transmission is intended only for the addressee. It contains information that is legally privileged, confidential or otherwise protected from use or disclosure. If you are not the intended recipient, you are strictly prohibited from reviewing, disclosing, copying using or disseminating any of this information or taking any action in reliance on or regarding this information. If you have received this fax in error, please notify us immediately by telephone so that we can arrange for its return to [place]. Phone: [phone], Toll-Free: [phone], Fax: [phone] Page: 2 of 2 Call Id: [id number] [place] Understands Yes PreDisposition Call Doctor Care Advice Given Per Guideline SEE HCP (OR PCP TRIAGE) WITHIN 4 HOURS: *  IF OFFICE WILL BE CLOSED AND NO PCP (PRIMARY CARE PROVIDER) SECOND-LEVEL TRIAGE: You need to be seen within the next 3 or 4 hours. A nearby Urgent Care Center [facility]) is often a good source of care. Another choice is to go to the ED. Go sooner if you become worse. * Diphenhydramine (Benadryl) is a FIRST GENERATION ANTIHISTAMINE medicine. It causes more sleepiness than the newer second generation antihistamine medicines. The adult dosage of Benadryl is 25 to 50 mg by mouth and you can take it up to 4 times a day. CALL BACK IF: * You become worse CARE ADVICE given per Face Swelling (Adult) guideline. Referrals GO TO FACILITY UNDECIDE
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
## 2020-05-08 NOTE — Telephone Encounter (Signed)
She went to urgent care this weekend [facility] She was given Prednisone, Hydroxyzine. Her facial swelling is improving. She denies any sob or wheezing.  Had reaction before with previous eye lashes-glue She states this time she was handling a significant amount of gorilla glue and her reaction started.  Glue has been added to her allergy list.

## 2020-05-30 ENCOUNTER — Telehealth: Payer: Self-pay

## 2020-05-30 NOTE — Progress Notes (Signed)
Chronic Care Management Pharmacy Assistant   Name: [name]  MRN: [id number] DOB: 1945/12/14  Reason for Encounter: Medication Review   PCP : Waldon Merl, PA-C  Allergies:   Allergies  Allergen Reactions  . Other Anaphylaxis and Swelling    Glue (eye lash glue, gorilla glue)  . Silodosin Rash    Facial rash  . Ace Inhibitors Other (See Comments)    REACTION: angioedema  . Buprenorphine Hcl Other (See Comments)    "crazy"  . Morphine And Related Other (See Comments)    "crazy" "crazy"  . Codeine Rash  . Sulfonamide Derivatives Rash  . Telmisartan Rash    Medications: Outpatient Encounter Medications as of 05/30/2020  Medication Sig  . Acetaminophen (TYLENOL EXTRA STRENGTH PO) Take 1,000 mg by mouth every 8 (eight) hours as needed.  . Biotin 17616 MCG TABS Take 10,000 mcg by mouth daily.  . cetirizine (ZYRTEC ALLERGY) 10 MG tablet Take 10 mg by mouth as needed.   . Cholecalciferol (VITAMIN D3 PO) Take 6,000 Units by mouth daily.   . citalopram (CELEXA) 20 MG tablet Take 1 tablet (20 mg total) by mouth daily.  . cyanocobalamin 2000 MCG tablet Take 1.5 tablets (3,000 mcg total) by mouth daily.  Marland Kitchen doxycycline (VIBRAMYCIN) 100 MG capsule Take 1 capsule (100 mg total) by mouth 2 (two) times daily.  . folic acid (FOLVITE) 1 MG tablet Take 2 tablets (2 mg total) by mouth daily.  . furosemide (LASIX) 40 MG tablet Take 1 tablet (40 mg total) by mouth daily.  . naproxen sodium (ALEVE) 220 MG tablet Take 220 mg by mouth as needed.  . predniSONE (DELTASONE) 5 MG tablet Take 6 pills for first day, 5 pills second day, 4 pills third day, 3 pills fourth day, 2 pills the fifth day, and 1 pill sixth day.  . pregabalin (LYRICA) 75 MG capsule Take 1 capsule (75 mg total) by mouth 2 (two) times daily.  . vitamin E 400 UNIT capsule Take 400 Units by mouth daily. 800 mg daily  . zinc gluconate 50 MG tablet Take 50 mg by mouth daily.   No facility-administered encounter medications  on file as of 05/30/2020.    Current Diagnosis: Patient Active Problem List   Diagnosis Date Noted  . Ankle effusion, left 03/23/2020  . Rheumatoid arthritis (HCC) 11/02/2018  . Episode of recurrent major depressive disorder (HCC) 12/05/2017  . Primary osteoarthritis of both hands 07/09/2017  . Primary osteoarthritis of both knees 07/09/2017  . DDD (degenerative disc disease), cervical 07/09/2017  . DDD (degenerative disc disease), lumbar 07/09/2017  . Peripheral edema 11/26/2016  . Medicare annual wellness visit, subsequent 06/30/2016  . Hx of adenomatous polyp of colon 07/03/2015  . 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 Family history of breast cancer   . Family history of colon cancer   . Family history of pancreatic cancer   . Malignant neoplasm of upper-outer quadrant of left breast in female, 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  . Peripheral neuropathy 01/01/2013  . 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  . S/P laparoscopic fundoplication 08/09/2011  . Steatosis of liver 12/07/2009  . COAGULOPATHY 07/12/2008  . CHEST XRAY, ABNORMAL 01/04/2008  . Vitamin D deficiency 04/14/2007  . VITAMIN B12 DEFICIENCY 02/27/2007  . Essential hypertension 02/27/2007  . GERD 02/27/2007  . Fibromyalgia 02/27/2007    Reviewed chart for medication changes ahead of medication coordination call.  No OVs, Consults, or hospital visits since last care  coordination call/Pharmacist visit. (If appropriate, list visit date, provider name)  No medication changes indicated OR if recent visit, treatment plan here.  BP Readings from Last 3 Encounters:  03/23/20 135/87  02/16/20 (!) 164/81  01/24/20 130/82    Lab Results  Component Value Date   HGBA1C 5.6 02/17/2019     Patient obtains medications through Adherence Packaging  30 Days    Patient is due for next adherence delivery on: 06-05-20. Called patient and reviewed medications and coordinated delivery.  This delivery  to include: Furosemide 40mg  Tab Take one tablet by mouth every morning Citalopram 20mg  Tab Take one tablet by mouth every morning   Confirmed delivery date of 06-05-20, advised patient that pharmacy will contact them the morning of delivery.  ,CMA Clinical Pharmacist Assistant [phone]  Follow-Up:  Pharmacist Review

## 2020-06-26 ENCOUNTER — Telehealth: Payer: Self-pay

## 2020-06-26 NOTE — Progress Notes (Signed)
Chronic Care Management Pharmacy Assistant   Name: [name]  MRN: [id number] DOB: 1945/06/14  Reason for Encounter: Medication Review  PCP : Brunetta Jeans, PA-C  Allergies:   Allergies  Allergen Reactions  . Other Anaphylaxis and Swelling    Glue (eye lash glue, gorilla glue)  . Silodosin Rash    Facial rash  . Ace Inhibitors Other (See Comments)    REACTION: angioedema  . Buprenorphine Hcl Other (See Comments)    "crazy"  . Morphine And Related Other (See Comments)    "crazy" "crazy"  . Codeine Rash  . Sulfonamide Derivatives Rash  . Telmisartan Rash    Medications: Outpatient Encounter Medications as of 06/26/2020  Medication Sig  . Acetaminophen (TYLENOL EXTRA STRENGTH PO) Take 1,000 mg by mouth every 8 (eight) hours as needed.  . Biotin 10000 MCG TABS Take 10,000 mcg by mouth daily.  . cetirizine (ZYRTEC ALLERGY) 10 MG tablet Take 10 mg by mouth as needed.   . Cholecalciferol (VITAMIN D3 PO) Take 6,000 Units by mouth daily.   . citalopram (CELEXA) 20 MG tablet Take 1 tablet (20 mg total) by mouth daily.  . cyanocobalamin 2000 MCG tablet Take 1.5 tablets (3,000 mcg total) by mouth daily.  Marland Kitchen doxycycline (VIBRAMYCIN) 100 MG capsule Take 1 capsule (100 mg total) by mouth 2 (two) times daily.  . folic acid (FOLVITE) 1 MG tablet Take 2 tablets (2 mg total) by mouth daily.  . furosemide (LASIX) 40 MG tablet Take 1 tablet (40 mg total) by mouth daily.  . naproxen sodium (ALEVE) 220 MG tablet Take 220 mg by mouth as needed.  . predniSONE (DELTASONE) 5 MG tablet Take 6 pills for first day, 5 pills second day, 4 pills third day, 3 pills fourth day, 2 pills the fifth day, and 1 pill sixth day.  . pregabalin (LYRICA) 75 MG capsule Take 1 capsule (75 mg total) by mouth 2 (two) times daily.  . vitamin E 400 UNIT capsule Take 400 Units by mouth daily. 800 mg daily  . zinc gluconate 50 MG tablet Take 50 mg by mouth daily.   No facility-administered encounter medications  on file as of 06/26/2020.    Current Diagnosis: Patient Active Problem List   Diagnosis Date Noted  . Ankle effusion, left 03/23/2020  . Rheumatoid arthritis ([place]) 11/02/2018  . Episode of recurrent major depressive disorder ([place]) 12/05/2017  . Primary osteoarthritis of both hands 07/09/2017  . Primary osteoarthritis of both knees 07/09/2017  . DDD (degenerative disc disease), cervical 07/09/2017  . DDD (degenerative disc disease), lumbar 07/09/2017  . Peripheral edema 11/26/2016  . Medicare annual wellness visit, subsequent 06/30/2016  . Hx of adenomatous polyp of colon 07/03/2015  . 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 Family history of breast cancer   . Family history of colon cancer   . Family history of pancreatic cancer   . Malignant neoplasm of upper-outer quadrant of left breast in female, estrogen receptor positive ([place]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  . Peripheral neuropathy 01/01/2013  . Melanoma of upper arm ([place]) 02/10/2012  . S/P laparoscopic fundoplication 40/98/1191  . Steatosis of liver 12/07/2009  . COAGULOPATHY 07/12/2008  . CHEST XRAY, ABNORMAL 01/04/2008  . Vitamin D deficiency 04/14/2007  . VITAMIN B12 DEFICIENCY 02/27/2007  . Essential hypertension 02/27/2007  . GERD 02/27/2007  . Fibromyalgia 02/27/2007    Reviewed chart for medication changes ahead of medication coordination call.  No OVs, Consults, or hospital visits since last care coordination  call/Pharmacist visit. (If appropriate, list visit date, provider name)  No medication changes indicated OR if recent visit, treatment plan here.  BP Readings from Last 3 Encounters:  03/23/20 135/87  02/16/20 (!) 164/81  01/24/20 130/82    Lab Results  Component Value Date   HGBA1C 5.6 02/17/2019     Patient obtains medications through Adherence Packaging  30 Days   Patient is due for next adherence delivery on: 07-04-20. Called patient and reviewed medications and coordinated delivery.  This delivery  to include: Citalopram 20mg  Take one tab by mouth every morning Furosemide 40mg  Take one tablet by mouth every morning  Confirmed delivery date of 07-04-20, advised patient that pharmacy will contact them the morning of delivery.  Georgiana Shore ,[place] Pharmacist Assistant [phone]  Follow-Up:  Pharmacist Review

## 2020-06-29 ENCOUNTER — Telehealth: Payer: Self-pay

## 2020-06-29 NOTE — Chronic Care Management (AMB) (Signed)
    Chronic Care Management Pharmacy Assistant   Name: [name]  MRN: [id number] DOB: 1945-06-07  Reason for Encounter: Schedule Follow-Up With Clinical Pharmacist  Attempted to reach the patient to schedule a follow up with Madelin Rear, CPP. I left a message for the patient to return my call [phone].  April D Calhoun, [place] Pharmacist Assistant [phone]  Follow-Up:  Scheduled Follow-Up With Clinical Pharmacist

## 2020-07-24 ENCOUNTER — Telehealth: Payer: Self-pay

## 2020-07-24 NOTE — Progress Notes (Unsigned)
Chronic Care Management Pharmacy Assistant   Name: [name]  MRN: [id number] DOB: 03/12/1946  Reason for Encounter: Medication Review  PCP : Brunetta Jeans, PA-C  Allergies:   Allergies  Allergen Reactions  . Other Anaphylaxis and Swelling    Glue (eye lash glue, gorilla glue)  . Silodosin Rash    Facial rash  . Ace Inhibitors Other (See Comments)    REACTION: angioedema  . Buprenorphine Hcl Other (See Comments)    "crazy"  . Morphine And Related Other (See Comments)    "crazy" "crazy"  . Codeine Rash  . Sulfonamide Derivatives Rash  . Telmisartan Rash    Medications: Outpatient Encounter Medications as of 07/24/2020  Medication Sig  . Acetaminophen (TYLENOL EXTRA STRENGTH PO) Take 1,000 mg by mouth every 8 (eight) hours as needed.  . Biotin 10000 MCG TABS Take 10,000 mcg by mouth daily.  . cetirizine (ZYRTEC ALLERGY) 10 MG tablet Take 10 mg by mouth as needed.   . Cholecalciferol (VITAMIN D3 PO) Take 6,000 Units by mouth daily.   . citalopram (CELEXA) 20 MG tablet Take 1 tablet (20 mg total) by mouth daily.  . cyanocobalamin 2000 MCG tablet Take 1.5 tablets (3,000 mcg total) by mouth daily.  Marland Kitchen doxycycline (VIBRAMYCIN) 100 MG capsule Take 1 capsule (100 mg total) by mouth 2 (two) times daily.  . folic acid (FOLVITE) 1 MG tablet Take 2 tablets (2 mg total) by mouth daily.  . furosemide (LASIX) 40 MG tablet Take 1 tablet (40 mg total) by mouth daily.  . naproxen sodium (ALEVE) 220 MG tablet Take 220 mg by mouth as needed.  . predniSONE (DELTASONE) 5 MG tablet Take 6 pills for first day, 5 pills second day, 4 pills third day, 3 pills fourth day, 2 pills the fifth day, and 1 pill sixth day.  . pregabalin (LYRICA) 75 MG capsule Take 1 capsule (75 mg total) by mouth 2 (two) times daily.  . vitamin E 400 UNIT capsule Take 400 Units by mouth daily. 800 mg daily  . zinc gluconate 50 MG tablet Take 50 mg by mouth daily.   No facility-administered encounter medications  on file as of 07/24/2020.    Current Diagnosis: Patient Active Problem List   Diagnosis Date Noted  . Ankle effusion, left 03/23/2020  . Rheumatoid arthritis ([place]) 11/02/2018  . Episode of recurrent major depressive disorder ([place]) 12/05/2017  . Primary osteoarthritis of both hands 07/09/2017  . Primary osteoarthritis of both knees 07/09/2017  . DDD (degenerative disc disease), cervical 07/09/2017  . DDD (degenerative disc disease), lumbar 07/09/2017  . Peripheral edema 11/26/2016  . Medicare annual wellness visit, subsequent 06/30/2016  . Hx of adenomatous polyp of colon 07/03/2015  . 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 Family history of breast cancer   . Family history of colon cancer   . Family history of pancreatic cancer   . Malignant neoplasm of upper-outer quadrant of left breast in female, estrogen receptor positive ([place]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  . Peripheral neuropathy 01/01/2013  . Melanoma of upper arm ([place]) 02/10/2012  . S/P laparoscopic fundoplication 10/62/6948  . Steatosis of liver 12/07/2009  . COAGULOPATHY 07/12/2008  . CHEST XRAY, ABNORMAL 01/04/2008  . Vitamin D deficiency 04/14/2007  . VITAMIN B12 DEFICIENCY 02/27/2007  . Essential hypertension 02/27/2007  . GERD 02/27/2007  . Fibromyalgia 02/27/2007    Reviewed chart for medication changes ahead of medication coordination call.  No OVs, Consults, or hospital visits since last care coordination  call/Pharmacist visit. (If appropriate, list visit date, provider name)  No medication changes indicated OR if recent visit, treatment plan here.  BP Readings from Last 3 Encounters:  03/23/20 135/87  02/16/20 (!) 164/81  01/24/20 130/82    Lab Results  Component Value Date   HGBA1C 5.6 02/17/2019     Patient obtains medications through Adherence Packaging  30 Days   Last adherence delivery included: (medication name and frequency)    Patient is due for next adherence delivery on: 08-03-20. Called  patient and reviewed medications and coordinated delivery.  This delivery to include: Citalopram 20mg  Tab Take one tab by mouth every morning Furosemide 40mg  Tab Take one tab by mouth every morning   Confirmed delivery date of 08-03-20, advised patient that pharmacy will contact them the morning of delivery.  Georgiana Shore ,[place] Pharmacist Assistant [phone]  Follow-Up:  {[facility] CPA Follow-up:[id number]}

## 2020-07-27 DIAGNOSIS — R059 Cough, unspecified: Secondary | ICD-10-CM | POA: Diagnosis not present

## 2020-07-27 DIAGNOSIS — J019 Acute sinusitis, unspecified: Secondary | ICD-10-CM | POA: Diagnosis not present

## 2020-07-29 DIAGNOSIS — T7840XA Allergy, unspecified, initial encounter: Secondary | ICD-10-CM | POA: Diagnosis not present

## 2020-07-29 DIAGNOSIS — J329 Chronic sinusitis, unspecified: Secondary | ICD-10-CM | POA: Diagnosis not present

## 2020-08-04 ENCOUNTER — Telehealth: Payer: Self-pay

## 2020-08-04 NOTE — Chronic Care Management (AMB) (Signed)
    Chronic Care Management Pharmacy Assistant   Name: [name]  MRN: [id number] DOB: 1945-08-06  Reason for Encounter: Medication Review  Medications: Outpatient Encounter Medications as of 08/04/2020  Medication Sig  . Acetaminophen (TYLENOL EXTRA STRENGTH PO) Take 1,000 mg by mouth every 8 (eight) hours as needed.  . Biotin 10000 MCG TABS Take 10,000 mcg by mouth daily.  . cetirizine (ZYRTEC ALLERGY) 10 MG tablet Take 10 mg by mouth as needed.   . Cholecalciferol (VITAMIN D3 PO) Take 6,000 Units by mouth daily.   . citalopram (CELEXA) 20 MG tablet Take 1 tablet (20 mg total) by mouth daily.  . cyanocobalamin 2000 MCG tablet Take 1.5 tablets (3,000 mcg total) by mouth daily.  Marland Kitchen doxycycline (VIBRAMYCIN) 100 MG capsule Take 1 capsule (100 mg total) by mouth 2 (two) times daily.  . folic acid (FOLVITE) 1 MG tablet Take 2 tablets (2 mg total) by mouth daily.  . furosemide (LASIX) 40 MG tablet Take 1 tablet (40 mg total) by mouth daily.  . naproxen sodium (ALEVE) 220 MG tablet Take 220 mg by mouth as needed.  . predniSONE (DELTASONE) 5 MG tablet Take 6 pills for first day, 5 pills second day, 4 pills third day, 3 pills fourth day, 2 pills the fifth day, and 1 pill sixth day.  . pregabalin (LYRICA) 75 MG capsule Take 1 capsule (75 mg total) by mouth 2 (two) times daily.  . vitamin E 400 UNIT capsule Take 400 Units by mouth daily. 800 mg daily  . zinc gluconate 50 MG tablet Take 50 mg by mouth daily.   No facility-administered encounter medications on file as of 08/04/2020.    Reviewed chart for medication changes ahead of medication coordination call.  No OVs, Consults, or hospital visits since last care coordination call/Pharmacist visit.  No medication changes indicated.  BP Readings from Last 3 Encounters:  03/23/20 135/87  02/16/20 (!) 164/81  01/24/20 130/82    Lab Results  Component Value Date   HGBA1C 5.6 02/17/2019     Patient obtains medications through Adherence  Packaging  30 Days  Last adherence delivery included: Citalopram 20 mg; one tablet every morning Furosemide 40 mg; one tablet every morning  Patient is due for next adherence delivery on: 08/04/2020. Called patient and reviewed medications and coordinated delivery.  This delivery to include: Citalopram 20 mg; one tablet every morning Furosemide 40 mg; one tablet every morning  Confirmed delivery date of 08/04/2020, advised patient that pharmacy will contact them the morning of delivery.   April D Calhoun, [place] Pharmacist Assistant [phone] //

## 2020-08-15 DIAGNOSIS — L239 Allergic contact dermatitis, unspecified cause: Secondary | ICD-10-CM | POA: Diagnosis not present

## 2020-08-21 DIAGNOSIS — J302 Other seasonal allergic rhinitis: Secondary | ICD-10-CM | POA: Diagnosis not present

## 2020-08-25 ENCOUNTER — Telehealth: Payer: Self-pay

## 2020-08-25 NOTE — Chronic Care Management (AMB) (Cosign Needed)
    Chronic Care Management Pharmacy Assistant   Name: [name]  MRN: [id number] DOB: [birth year]   Reason for Encounter: Medication Review   Recent office visits:  n/a  Recent consult visits:  [facility] visits:  None in previous 6 months  Medications: Outpatient Encounter Medications as of 08/25/2020  Medication Sig   Acetaminophen (TYLENOL EXTRA STRENGTH PO) Take 1,000 mg by mouth every 8 (eight) hours as needed.   Biotin 10000 MCG TABS Take 10,000 mcg by mouth daily.   cetirizine (ZYRTEC ALLERGY) 10 MG tablet Take 10 mg by mouth as needed.    Cholecalciferol (VITAMIN D3 PO) Take 6,000 Units by mouth daily.    citalopram (CELEXA) 20 MG tablet Take 1 tablet (20 mg total) by mouth daily.   cyanocobalamin 2000 MCG tablet Take 1.5 tablets (3,000 mcg total) by mouth daily.   doxycycline (VIBRAMYCIN) 100 MG capsule Take 1 capsule (100 mg total) by mouth 2 (two) times daily.   folic acid (FOLVITE) 1 MG tablet Take 2 tablets (2 mg total) by mouth daily.   furosemide (LASIX) 40 MG tablet Take 1 tablet (40 mg total) by mouth daily.   naproxen sodium (ALEVE) 220 MG tablet Take 220 mg by mouth as needed.   predniSONE (DELTASONE) 5 MG tablet Take 6 pills for first day, 5 pills second day, 4 pills third day, 3 pills fourth day, 2 pills the fifth day, and 1 pill sixth day.   pregabalin (LYRICA) 75 MG capsule Take 1 capsule (75 mg total) by mouth 2 (two) times daily.   vitamin E 400 UNIT capsule Take 400 Units by mouth daily. 800 mg daily   zinc gluconate 50 MG tablet Take 50 mg by mouth daily.   No facility-administered encounter medications on file as of 08/25/2020.    Reviewed chart for medication changes ahead of medication coordination call.  No OVs, Consults, or hospital visits since last care coordination call/Pharmacist visit.  No medication changes indicated.  BP Readings from Last 3 Encounters:  03/23/20 135/87  02/16/20 (!) 164/81  01/24/20 130/82    Lab Results   Component Value Date   HGBA1C 5.6 02/17/2019     Patient obtains medications through Adherence Packaging  30 Days   Last adherence delivery included:  Citalopram 20 mg; one tablet every morning Furosemide 40 mg; one tablet every morning  Patient is due for next adherence delivery on: 09/04/2020. Called patient and reviewed medications and coordinated delivery.  This delivery to include: Citalopram 20 mg; one tablet every morning Furosemide 40 mg; one tablet every morning  Patient needs refills for: Citalopram 20 mg; one tablet every morning Furosemide 40 mg; one tablet every morning  Confirmed delivery date of , advised patient that pharmacy will contact them the morning of delivery.   Star Rating Drugs:  *Unsuccessful attempt at reaching patient to complete medication coordination call. Chart review completed.  April D Calhoun, [place] Pharmacist Assistant [phone]

## 2020-08-29 DIAGNOSIS — J329 Chronic sinusitis, unspecified: Secondary | ICD-10-CM | POA: Diagnosis not present

## 2020-08-29 DIAGNOSIS — Z0000 Encounter for general adult medical examination without abnormal findings: Secondary | ICD-10-CM | POA: Diagnosis not present

## 2020-08-29 DIAGNOSIS — E7800 Pure hypercholesterolemia, unspecified: Secondary | ICD-10-CM | POA: Diagnosis not present

## 2020-08-29 DIAGNOSIS — E663 Overweight: Secondary | ICD-10-CM | POA: Diagnosis not present

## 2020-08-29 DIAGNOSIS — Z79899 Other long term (current) drug therapy: Secondary | ICD-10-CM | POA: Diagnosis not present

## 2020-08-29 DIAGNOSIS — R739 Hyperglycemia, unspecified: Secondary | ICD-10-CM | POA: Diagnosis not present

## 2020-08-29 DIAGNOSIS — R030 Elevated blood-pressure reading, without diagnosis of hypertension: Secondary | ICD-10-CM | POA: Diagnosis not present

## 2020-08-29 DIAGNOSIS — R0602 Shortness of breath: Secondary | ICD-10-CM | POA: Diagnosis not present

## 2020-09-18 ENCOUNTER — Encounter: Payer: Self-pay | Admitting: Physician Assistant

## 2020-09-21 ENCOUNTER — Ambulatory Visit (INDEPENDENT_AMBULATORY_CARE_PROVIDER_SITE_OTHER): Payer: Medicare HMO | Admitting: Family

## 2020-09-21 ENCOUNTER — Encounter: Payer: Self-pay | Admitting: Family

## 2020-09-21 ENCOUNTER — Other Ambulatory Visit: Payer: Self-pay

## 2020-09-21 VITALS — BP 142/76 | HR 79 | Temp 98.5°F | Ht 65.5 in | Wt 185.0 lb

## 2020-09-21 DIAGNOSIS — R600 Localized edema: Secondary | ICD-10-CM

## 2020-09-21 DIAGNOSIS — R0600 Dyspnea, unspecified: Secondary | ICD-10-CM | POA: Diagnosis not present

## 2020-09-21 DIAGNOSIS — R0789 Other chest pain: Secondary | ICD-10-CM

## 2020-09-21 DIAGNOSIS — I10 Essential (primary) hypertension: Secondary | ICD-10-CM | POA: Diagnosis not present

## 2020-09-21 DIAGNOSIS — Z0100 Encounter for examination of eyes and vision without abnormal findings: Secondary | ICD-10-CM | POA: Diagnosis not present

## 2020-09-21 DIAGNOSIS — R053 Chronic cough: Secondary | ICD-10-CM | POA: Diagnosis not present

## 2020-09-21 DIAGNOSIS — H5203 Hypermetropia, bilateral: Secondary | ICD-10-CM | POA: Diagnosis not present

## 2020-09-21 DIAGNOSIS — R609 Edema, unspecified: Secondary | ICD-10-CM

## 2020-09-21 DIAGNOSIS — R0609 Other forms of dyspnea: Secondary | ICD-10-CM

## 2020-09-21 DIAGNOSIS — R5383 Other fatigue: Secondary | ICD-10-CM

## 2020-09-21 DIAGNOSIS — E538 Deficiency of other specified B group vitamins: Secondary | ICD-10-CM

## 2020-09-21 MED ORDER — PANTOPRAZOLE SODIUM 40 MG PO TBEC: 40.0000 mg | DELAYED_RELEASE_TABLET | Freq: Two times a day (BID) | ORAL | 1 refills | Status: DC

## 2020-09-21 MED ORDER — FLUTICASONE PROPIONATE 50 MCG/ACT NA SUSP: 2.0000 | Freq: Every day | NASAL | 6 refills | Status: DC

## 2020-09-21 NOTE — Patient Instructions (Signed)
Please take the Protonix twice a day to help with the reflux; keep planned follow-up with GI and they will let you know what they want to do with that medication;  You can also use the Flonase to help with your nasal congestion; hopefully the combination of both Flonase and Protonix will stop the cough;  Please check your blood pressure daily as we discussed- let's make sure your blood pressure remains controlled with you taking the Lasix every other day;

## 2020-09-21 NOTE — Progress Notes (Signed)
[name] is a 75 y.o. female with the following history as recorded in EpicCare:  Patient Active Problem List   Diagnosis Date Noted  . Ankle effusion, left 03/23/2020  . Rheumatoid arthritis ([place]) 11/02/2018  . Episode of recurrent major depressive disorder ([place]) 12/05/2017  . Primary osteoarthritis of both hands 07/09/2017  . Primary osteoarthritis of both knees 07/09/2017  . DDD (degenerative disc disease), cervical 07/09/2017  . DDD (degenerative disc disease), lumbar 07/09/2017  . Peripheral edema 11/26/2016  . Medicare annual wellness visit, subsequent 06/30/2016  . Hx of adenomatous polyp of colon 07/03/2015  . 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 Family history of breast cancer   . Family history of colon cancer   . Family history of pancreatic cancer   . Malignant neoplasm of upper-outer quadrant of left breast in female, estrogen receptor positive ([place]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  . Peripheral neuropathy 01/01/2013  . Melanoma of upper arm ([place]) 02/10/2012  . S/P laparoscopic fundoplication 40/97/3532  . Steatosis of liver 12/07/2009  . COAGULOPATHY 07/12/2008  . CHEST XRAY, ABNORMAL 01/04/2008  . Vitamin D deficiency 04/14/2007  . VITAMIN B12 DEFICIENCY 02/27/2007  . Essential hypertension 02/27/2007  . GERD 02/27/2007  . Fibromyalgia 02/27/2007    Current Outpatient Medications  Medication Sig Dispense Refill  . Acetaminophen (TYLENOL EXTRA STRENGTH PO) Take 1,000 mg by mouth every 8 (eight) hours as needed.    . Cholecalciferol (VITAMIN D3 PO) Take 6,000 Units by mouth daily.     . citalopram (CELEXA) 20 MG tablet Take 1 tablet (20 mg total) by mouth daily. 90 tablet 1  . fluticasone (FLONASE) 50 MCG/ACT nasal spray Place 2 sprays into both nostrils daily. 16 g 6  . furosemide (LASIX) 40 MG tablet Take 1 tablet (40 mg total) by mouth daily. 90 tablet 1  . naproxen sodium (ALEVE) 220 MG tablet Take 220 mg by mouth as needed.    . pantoprazole (PROTONIX) 40 MG  tablet Take 1 tablet (40 mg total) by mouth 2 (two) times daily. 60 tablet 1  . cyanocobalamin 2000 MCG tablet Take 1.5 tablets (3,000 mcg total) by mouth daily. (Patient not taking: Reported on 09/21/2020) 90 tablet    No current facility-administered medications for this visit.    Allergies: Other, Silodosin, Ace inhibitors, Buprenorphine hcl, Morphine and related, Codeine, Sulfonamide derivatives, and Telmisartan  Past Medical History:  Diagnosis Date  . Arthritis    "spine" (07/28/2014)  . Blood dyscrasia    bruises and bleed easily  . Breast cancer, left breast ([place]) 07/28/2014  . Breast cancer, right breast ([place]) 1995  . Chronic back pain    "all over"  . Complication of anesthesia    went to sleep easily but hard to wake up up until elbow OR in 2010  . Depression   . Dyspnea    Normal Spirometry 03/2008 EF 65% BNP normal 11/2007  . Family history of breast cancer   . Family history of colon cancer   . Family history of pancreatic cancer   . Gastric polyps   . GERD (gastroesophageal reflux disease)   . History of chicken pox   . History of hiatal hernia   . Hx of adenomatous polyp of colon 07/03/2015  . Kidney stone    right kidney   . Lichen sclerosus   . Melanoma ([place]) 2010   "right elbow; [facility]"  . Mild anxiety   . Vitamin B12 deficiency   . Vitamin D deficiency  Past Surgical History:  Procedure Laterality Date  . BREAST BIOPSY Left 04/2014  . BREAST RECONSTRUCTION WITH PLACEMENT OF TISSUE EXPANDER AND FLEX HD (ACELLULAR HYDRATED DERMIS) Left 07/28/2014   Procedure: LEFT BREAST RECONSTRUCTION PLACEMENT OF LEFT TISSUE EXPANDER ;  Surgeon: Crissie Reese, MD;  Location: [facility];  Service: Plastics;  Laterality: Left;  . BREAST RECONSTRUCTION WITH PLACEMENT OF TISSUE EXPANDER AND FLEX HD (ACELLULAR HYDRATED DERMIS) Left 09/08/2014   Procedure: REMOVAL OF TISSUE EXPANDER FROM LEFT BREAST;  Surgeon: Crissie Reese, MD;  Location: [facility];  Service: Plastics;  Laterality:  Left;  . BUNIONECTOMY Bilateral 1970's  . COLONOSCOPY     Dr Sharlett Iles  . CYSTOSCOPY WITH RETROGRADE PYELOGRAM, URETEROSCOPY AND STENT PLACEMENT Right 06/09/2015   Procedure: CYSTOSCOPY WITH RIGHT RETROGRADE PYELOGRAM, RIGHT URETEROSCOPY AND RIGHT URTERAL STENT PLACEMENT;  Surgeon: Ardis Hughs, MD;  Location: [facility];  Service: Urology;  Laterality: Right;  . ESOPHAGOGASTRODUODENOSCOPY    . HERNIA REPAIR    . HOLMIUM LASER APPLICATION Right 2/68/3419   Procedure: HOLMIUM LASER APPLICATION;  Surgeon: Ardis Hughs, MD;  Location: [facility];  Service: Urology;  Laterality: Right;  . LATISSIMUS FLAP TO BREAST Left 09/08/2014   Procedure: LEFT LATISSIMUS FLAP TO BREAST WITH SALINE IMPLANT FOR BREAST RECONSTRUCTION;  Surgeon: Crissie Reese, MD;  Location: [facility];  Service: Plastics;  Laterality: Left;  . LUMBAR FUSION  01/2018   L5-S1 transitional segmental laminectomy and Fusion  . MASTECTOMY Right 1996    chemotherapy. pt. states 13 lymph nodes were removed  . MASTECTOMY COMPLETE / SIMPLE W/ SENTINEL NODE BIOPSY Left 07/28/2014  . MASTECTOMY W/ SENTINEL NODE BIOPSY Left 07/28/2014   Procedure: LEFT MASTECTOMY WITH SENTINEL LYMPH NODE MAPPING;  Surgeon: Autumn Messing III, MD;  Location: [facility];  Service: General;  Laterality: Left;  Marland Kitchen MELANOMA EXCISION Right 2010   From elbow-- [facility]   . NISSEN FUNDOPLICATION  62/2297  . RECONSTRUCTION BREAST IMMEDIATE / DELAYED W/ TISSUE EXPANDER Left 07/28/2014  . TEMPOROMANDIBULAR JOINT SURGERY Bilateral 1987  . TONSILLECTOMY      Family History  Problem Relation Age of Onset  . Colon cancer Cousin 56       double first cousin  . Colon cancer Cousin 26       double first cousin  . Colon polyps Father        between 17-20  . Dementia Father   . CAD Father        CABG at age 21  . Stroke Mother   . Pancreatic cancer Brother 15  . Colon polyps Brother        between 10-20  . Cancer Paternal Uncle        NOS  . Anemia Paternal Grandfather         pernicious anemia  . Breast cancer Cousin 47       double first cousin  . Breast cancer Cousin 74       paternal cousin  . Stroke Other        F 1st degree relative 54, M 1st degree relative  . Esophageal cancer Neg Hx   . Stomach cancer Neg Hx     Social History   Tobacco Use  . Smoking status: Never Smoker  . Smokeless tobacco: Never Used  . Tobacco comment: Regular Exercise - Yes  Substance Use Topics  . Alcohol use: No    Subjective:   TOC from provider at [facility]; Complaining of cough x 1-2  months; seen at [facility] for [facility]- normal CXR, labs per patient; no records available for review;  Has noticed that cough does seem to be related to food; history of esophageal surgery; has taken PPI in the past but not currently taking; scheduled to see GI in 2 weeks to discuss endoscopy and colonoscopy;   Also complaining of chronic fatigue- notes she gets winded easily with activity; no chest pain on exertion but does occasionally have some intermittent chest pains;   Notes she has not taken her Lasix in 2 weeks- felt like it was drying her out too much;     Objective:  Vitals:   09/21/20 1402  BP: (!) 142/76  Pulse: 79  Temp: 98.5 F (36.9 C)  TempSrc: Oral  SpO2: 99%  Weight: 185 lb (83.9 kg)  Height: 5' 5.5" (1.664 m)    General: Well developed, well nourished, in no acute distress  Skin : Warm and dry.  Head: Normocephalic and atraumatic  Eyes: Sclera and conjunctiva clear; pupils round and reactive to light; extraocular movements intact  Ears: External normal; canals clear; tympanic membranes normal  Oropharynx: Pink, supple. No suspicious lesions  Neck: Supple without thyromegaly, adenopathy  Lungs: Respirations unlabored; clear to auscultation bilaterally without wheeze, rales, rhonchi  CVS exam: normal rate and regular rhythm.  Abdomen: Soft; nontender; nondistended; normoactive bowel sounds; no masses or hepatosplenomegaly   Musculoskeletal: No deformities; no active joint inflammation  Extremities: No edema, cyanosis, clubbing  Vessels: Symmetric bilaterally  Neurologic: Alert and oriented; speech intact; face symmetrical; moves all extremities well; CNII-XII intact without focal deficit   Assessment:  1. Other fatigue   2. B12 deficiency   3. Atypical chest pain   4. DOE (dyspnea on exertion)   5. Chronic cough   6. Peripheral edema   7. Essential hypertension     Plan:  1. Update labs today including CBC, CMP, TSH; 2. Check B12 level today; 3. Update EKG-NSR will also refer to cardiology for further evaluation;  4. ? Related to GERD; normal CXR in the past month; refer to cardiology; check d-dimer;  5. Suspect GERD- start Protonix 40 mg bid; keep planned follow-up with GI; 6. Patient wants to use Lasix every other day- feels like it is too drying to use daily; 7. Patient notes she has never been told she has HTN- she will start checking regularly and follow up with readings in the next 1-2 weeks; follow up to be determined;  This visit occurred during the SARS-CoV-2 public health emergency.  Safety protocols were in place, including screening questions prior to the visit, additional usage of staff PPE, and extensive cleaning of exam room while observing appropriate contact time as indicated for disinfecting solutions.     No follow-ups on file.  Orders Placed This Encounter  Procedures  . CBC with Differential/Platelet  . Comp Met (CMET)  . TSH  . Vitamin B12  . D-Dimer, Quantitative  . Ambulatory referral to Cardiology    Referral Priority:   Routine    Referral Type:   Consultation    Referral Reason:   Specialty Services Required    Requested Specialty:   Cardiology    Number of Visits Requested:   1  . EKG 12-Lead    Requested Prescriptions   Signed Prescriptions Disp Refills  . pantoprazole (PROTONIX) 40 MG tablet 60 tablet 1    Sig: Take 1 tablet (40 mg total) by mouth 2 (two)  times daily.  . fluticasone (  FLONASE) 50 MCG/ACT nasal spray 16 g 6    Sig: Place 2 sprays into both nostrils daily.

## 2020-09-22 ENCOUNTER — Other Ambulatory Visit: Payer: Self-pay | Admitting: Family

## 2020-09-22 DIAGNOSIS — R7989 Other specified abnormal findings of blood chemistry: Secondary | ICD-10-CM

## 2020-09-22 DIAGNOSIS — R053 Chronic cough: Secondary | ICD-10-CM

## 2020-09-22 DIAGNOSIS — R0609 Other forms of dyspnea: Secondary | ICD-10-CM

## 2020-09-22 DIAGNOSIS — R0600 Dyspnea, unspecified: Secondary | ICD-10-CM

## 2020-09-22 LAB — COMPREHENSIVE METABOLIC PANEL
ALT: 13 U/L (ref 0–35)
AST: 18 U/L (ref 0–37)
Albumin: 4.2 g/dL (ref 3.5–5.2)
Alkaline Phosphatase: 68 U/L (ref 39–117)
BUN: 18 mg/dL (ref 6–23)
CO2: 29 mEq/L (ref 19–32)
Calcium: 9.5 mg/dL (ref 8.4–10.5)
Chloride: 104 mEq/L (ref 96–112)
Creatinine, Ser: 0.74 mg/dL (ref 0.40–1.20)
GFR: 79.57 mL/min (ref >60.00)
Glucose, Bld: 74 mg/dL (ref 70–99)
Potassium: 4.3 mEq/L (ref 3.5–5.1)
Sodium: 141 mEq/L (ref 135–145)
Total Bilirubin: 0.5 mg/dL (ref 0.2–1.2)
Total Protein: 6.7 g/dL (ref 6.0–8.3)

## 2020-09-22 LAB — CBC WITH DIFFERENTIAL/PLATELET
Basophils Absolute: 0.1 10*3/uL (ref 0.0–0.1)
Basophils Relative: 0.9 % (ref 0.0–3.0)
Eosinophils Absolute: 0.5 10*3/uL (ref 0.0–0.7)
Eosinophils Relative: 6.8 % — ABNORMAL HIGH (ref 0.0–5.0)
HCT: 40.7 % (ref 36.0–46.0)
Hemoglobin: 13.9 g/dL (ref 12.0–15.0)
Lymphocytes Relative: 23.5 % (ref 12.0–46.0)
Lymphs Abs: 1.7 10*3/uL (ref 0.7–4.0)
MCHC: 34.2 g/dL (ref 30.0–36.0)
MCV: 91.3 fl (ref 78.0–100.0)
Monocytes Absolute: 0.6 10*3/uL (ref 0.1–1.0)
Monocytes Relative: 8.9 % (ref 3.0–12.0)
Neutro Abs: 4.4 10*3/uL (ref 1.4–7.7)
Neutrophils Relative %: 59.9 % (ref 43.0–77.0)
Platelets: 195 10*3/uL (ref 150.0–400.0)
RBC: 4.45 Mil/uL (ref 3.87–5.11)
RDW: 13.5 % (ref 11.5–15.5)
WBC: 7.3 10*3/uL (ref 4.0–10.5)

## 2020-09-22 LAB — TSH: TSH: 1.46 u[IU]/mL (ref 0.35–4.50)

## 2020-09-22 LAB — D-DIMER, QUANTITATIVE: D-Dimer, Quant: 0.58 mcg/mL FEU — ABNORMAL HIGH (ref <0.50)

## 2020-09-22 LAB — VITAMIN B12: Vitamin B-12: 485 pg/mL (ref 211–911)

## 2020-10-02 ENCOUNTER — Telehealth: Payer: Self-pay | Admitting: Family

## 2020-10-02 ENCOUNTER — Ambulatory Visit (HOSPITAL_BASED_OUTPATIENT_CLINIC_OR_DEPARTMENT_OTHER)
Admission: RE | Admit: 2020-10-02 | Discharge: 2020-10-02 | Disposition: A | Discharge status: Home / Self Care | Payer: Medicare HMO | Source: Ambulatory Visit | Attending: Family | Admitting: Family

## 2020-10-02 ENCOUNTER — Other Ambulatory Visit: Payer: Self-pay

## 2020-10-02 ENCOUNTER — Encounter (HOSPITAL_BASED_OUTPATIENT_CLINIC_OR_DEPARTMENT_OTHER): Payer: Self-pay

## 2020-10-02 DIAGNOSIS — I2699 Other pulmonary embolism without acute cor pulmonale: Secondary | ICD-10-CM | POA: Diagnosis not present

## 2020-10-02 DIAGNOSIS — R053 Chronic cough: Secondary | ICD-10-CM

## 2020-10-02 DIAGNOSIS — Z853 Personal history of malignant neoplasm of breast: Secondary | ICD-10-CM | POA: Diagnosis not present

## 2020-10-02 DIAGNOSIS — R7989 Other specified abnormal findings of blood chemistry: Secondary | ICD-10-CM | POA: Diagnosis not present

## 2020-10-02 DIAGNOSIS — R0609 Other forms of dyspnea: Secondary | ICD-10-CM

## 2020-10-02 DIAGNOSIS — R0600 Dyspnea, unspecified: Secondary | ICD-10-CM

## 2020-10-02 DIAGNOSIS — J9811 Atelectasis: Secondary | ICD-10-CM | POA: Diagnosis not present

## 2020-10-02 DIAGNOSIS — R911 Solitary pulmonary nodule: Secondary | ICD-10-CM | POA: Diagnosis not present

## 2020-10-02 MED ORDER — IOHEXOL 350 MG/ML SOLN
100.0000 mL | Freq: Once | INTRAVENOUS | Status: AC | PRN
  Administered 2020-10-02: 100 mL via INTRAVENOUS

## 2020-10-02 NOTE — Telephone Encounter (Signed)
No PE noted; changes that could indicate asthma/ COPD: she has seen pulmonology in the past and I would recommend that we get her back to them to see if they can optimize her treatment for this chronic cough that has plagued her for years. Will get the referral updated for her.

## 2020-10-02 NOTE — Telephone Encounter (Signed)
Pt, called to get lab results, she would like a return call  Please advice

## 2020-10-02 NOTE — Telephone Encounter (Signed)
I have called pt back and informed her of the message from the provider and she stated understanding. She will go ahead and call Dr. Jeanine Luz office to schedule an appointment.

## 2020-10-02 NOTE — Telephone Encounter (Signed)
I have called pt back and relayed the message from the provider about her CT ANGIO and she will call her pulmonologist and make an appointment. She also knows that we have resent a  referral to Pulm.

## 2020-10-02 NOTE — Telephone Encounter (Signed)
I have attempted to call pt and there was no answer so I left a message to call back.  

## 2020-10-03 ENCOUNTER — Ambulatory Visit: Payer: Medicare HMO | Admitting: Physician Assistant

## 2020-10-03 ENCOUNTER — Encounter: Payer: Self-pay | Admitting: Physician Assistant

## 2020-10-03 VITALS — BP 130/90 | HR 76 | Ht 65.5 in | Wt 184.0 lb

## 2020-10-03 DIAGNOSIS — Z86010 Personal history of colonic polyps: Secondary | ICD-10-CM | POA: Diagnosis not present

## 2020-10-03 DIAGNOSIS — Z85820 Personal history of malignant melanoma of skin: Secondary | ICD-10-CM | POA: Diagnosis not present

## 2020-10-03 DIAGNOSIS — L814 Other melanin hyperpigmentation: Secondary | ICD-10-CM | POA: Diagnosis not present

## 2020-10-03 DIAGNOSIS — R1310 Dysphagia, unspecified: Secondary | ICD-10-CM | POA: Diagnosis not present

## 2020-10-03 DIAGNOSIS — Z860101 Personal history of adenomatous and serrated colon polyps: Secondary | ICD-10-CM

## 2020-10-03 DIAGNOSIS — D0472 Carcinoma in situ of skin of left lower limb, including hip: Secondary | ICD-10-CM | POA: Diagnosis not present

## 2020-10-03 DIAGNOSIS — L821 Other seborrheic keratosis: Secondary | ICD-10-CM | POA: Diagnosis not present

## 2020-10-03 DIAGNOSIS — R053 Chronic cough: Secondary | ICD-10-CM | POA: Diagnosis not present

## 2020-10-03 DIAGNOSIS — L570 Actinic keratosis: Secondary | ICD-10-CM | POA: Diagnosis not present

## 2020-10-03 DIAGNOSIS — K219 Gastro-esophageal reflux disease without esophagitis: Secondary | ICD-10-CM | POA: Diagnosis not present

## 2020-10-03 MED ORDER — PANTOPRAZOLE SODIUM 40 MG PO TBEC
40.0000 mg | DELAYED_RELEASE_TABLET | Freq: Two times a day (BID) | ORAL | 8 refills | Status: DC
Start: 2020-10-03 — End: 2021-05-01

## 2020-10-03 NOTE — Progress Notes (Signed)
Subjective:    Patient ID: [name], female    DOB: [birth year], 75 y.o.   MRN: [id number]  HPI [name] is a pleasant 75 year old white female known to Dr. Carlean Purl, who comes in to discuss recall colonoscopy but has also been having a lot of issues over the past couple of months with persistent coughing, sometimes to the point of gagging.  She complains of an ongoing fullness sensation in her throat which is constant.  She says when she is trying to swallow she feels as if something is irritated in the upper esophagus which then causes her to start coughing.  She says she has hard coughing spells at times to the point of gagging sometimes triggered by eating.  Usually if she is drinking liquids she is not having any issues but eating cold foods will trigger symptoms and solid foods seem to trigger symptoms.  She is not having any definite sensation of food lodging in the esophagus and is not having any regurgitation.  She is having symptoms during the daytime and at night. She had not previously been on any PPI therapy has not having any symptoms of active GERD.  She has been placed on twice daily Protonix 40 mg over the past couple weeks and feels like this is helping some but she still having significant symptoms.  She has been started on ProAir which seems to be helping somewhat with coughing as well She is status post remote Nissen fundoplication in 7341.  She had EGD in November 2019 which showed no abnormality of the esophagus she did have multiple diminutive fundic gland polyps. She last had colonoscopy in 2017 with finding of a 2 mm tubular adenoma and was recommended for 5-year interval follow-up. She had CT angio of the chest just done yesterday which shows mosaic attenuation throughout the chest which can be seen in setting of small airway disease/air trapping no consolidation, status post bilateral mastectomy, small bilateral pulmonary nodules unchanged.  There is a patulous esophagus  similar to prior study no adenopathy in the thoracic inlet or axillary region. She relates that she is being referred to pulmonary as they are concerned she may have developed COPD and/or asthma. Other medical problems include prior history of breast cancer 2015, history of melanoma, she has family history of colon cancer, personal history of fibromyalgia, hepatic steatosis, GERD, hypertension osteoarthritis and rheumatoid arthritis.  Review of Systems.Pertinent positive and negative review of systems were noted in the above HPI section.  All other review of systems was otherwise negative.  Outpatient Encounter Medications as of 10/03/2020  Medication Sig  . Acetaminophen (TYLENOL EXTRA STRENGTH PO) Take 1,000 mg by mouth every 8 (eight) hours as needed.  . Cholecalciferol (VITAMIN D3 PO) Take 6,000 Units by mouth daily.   . citalopram (CELEXA) 20 MG tablet Take 1 tablet (20 mg total) by mouth daily.  . cyanocobalamin 2000 MCG tablet Take 1.5 tablets (3,000 mcg total) by mouth daily.  . fluticasone (FLONASE) 50 MCG/ACT nasal spray Place 2 sprays into both nostrils daily.  . furosemide (LASIX) 40 MG tablet Take 1 tablet (40 mg total) by mouth daily.  . naproxen sodium (ALEVE) 220 MG tablet Take 220 mg by mouth as needed.  . [DISCONTINUED] pantoprazole (PROTONIX) 40 MG tablet Take 1 tablet (40 mg total) by mouth 2 (two) times daily.  . pantoprazole (PROTONIX) 40 MG tablet Take 1 tablet (40 mg total) by mouth 2 (two) times daily.   No facility-administered encounter medications on  file as of 10/03/2020.   Allergies  Allergen Reactions  . Other Anaphylaxis and Swelling    Glue (eye lash glue, gorilla glue)  . Silodosin Rash    Facial rash  . Ace Inhibitors Other (See Comments)    REACTION: angioedema  . Buprenorphine Hcl Other (See Comments)    "crazy"  . Morphine And Related Other (See Comments)    "crazy" "crazy"  . Codeine Rash  . Sulfonamide Derivatives Rash  . Telmisartan Rash    Patient Active Problem List   Diagnosis Date Noted  . Ankle effusion, left 03/23/2020  . Rheumatoid arthritis ([place]) 11/02/2018  . Episode of recurrent major depressive disorder ([place]) 12/05/2017  . Primary osteoarthritis of both hands 07/09/2017  . Primary osteoarthritis of both knees 07/09/2017  . DDD (degenerative disc disease), cervical 07/09/2017  . DDD (degenerative disc disease), lumbar 07/09/2017  . Peripheral edema 11/26/2016  . Medicare annual wellness visit, subsequent 06/30/2016  . Hx of adenomatous polyp of colon 07/03/2015  . 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 Family history of breast cancer   . Family history of colon cancer   . Family history of pancreatic cancer   . Malignant neoplasm of upper-outer quadrant of left breast in female, estrogen receptor positive ([place]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  . Peripheral neuropathy 01/01/2013  . Melanoma of upper arm ([place]) 02/10/2012  . S/P laparoscopic fundoplication 94/85/4627  . Steatosis of liver 12/07/2009  . COAGULOPATHY 07/12/2008  . CHEST XRAY, ABNORMAL 01/04/2008  . Vitamin D deficiency 04/14/2007  . VITAMIN B12 DEFICIENCY 02/27/2007  . Essential hypertension 02/27/2007  . GERD 02/27/2007  . Fibromyalgia 02/27/2007   Social History   Socioeconomic History  . Marital status: Married    Spouse name: Not on file  . Number of children: 2  . Years of education: Not on file  . Highest education level: Not on file  Occupational History  . Occupation: Freight forwarder  Tobacco Use  . Smoking status: Never Smoker  . Smokeless tobacco: Never Used  . Tobacco comment: Regular Exercise - Yes  Vaping Use  . Vaping Use: Never used  Substance and Sexual Activity  . Alcohol use: No  . Drug use: No  . Sexual activity: Yes  Other Topics Concern  . Not on file  Social History Narrative   She lives with husband.     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 [place] Strain: [place]   . Difficulty of Paying Living Expenses: Not hard at all  Food Insecurity: Not on file  Transportation Needs: Not on file  Physical Activity: Inactive  . Days of Exercise per Week: 0 days  . Minutes of Exercise per Session: 0 min  Stress: No Stress Concern Present  . Feeling of Stress : Not at all  Social Connections: Socially Integrated  . Frequency of Communication with Friends and Family: More than three times a week  . Frequency of Social Gatherings with Friends and Family: More than three times a week  . Attends Religious Services: More than 4 times per year  . Active Member of Clubs or Organizations: Yes  . Attends Archivist Meetings: More than 4 times per year  . Marital Status: Married  Human resources officer Violence: Not At Risk  . Fear of Current or Ex-Partner: No  . Emotionally Abused: No  . Physically Abused: No  . Sexually Abused:  No    [name]'s family history includes Anemia in her paternal grandfather; Breast cancer (age of onset: 47) in her cousin; Breast cancer (age of onset: 52) in her cousin; CAD in her father; Cancer in her paternal uncle; Colon cancer (age of onset: 76) in her cousin; Colon cancer (age of onset: 16) in her cousin; Colon polyps in her brother and father; Dementia in her father; Pancreatic cancer (age of onset: 27) in her brother; Stroke in her mother and another family member.      Objective:    Vitals:   10/03/20 1520  BP: 130/90  Pulse: 76  SpO2: 99%    Physical Exam Well-developed well-nourished older white female in no acute distress.  Height, Weight, 184 BMI 30.15 coughing quite a bit during the interview, somewhat hoarse  HEENT; nontraumatic normocephalic, EOMI, PE R LA, sclera anicteric. Oropharynx; not examined today Neck; supple, no JVD Cardiovascular; regular rate and rhythm with S1-S2, no murmur rub or gallop Pulmonary; Clear bilaterally Abdomen; soft, nontender,  nondistended, no palpable mass or hepatosplenomegaly, bowel sounds are active Rectal; not done today Skin; benign exam, no jaundice rash or appreciable lesions Extremities; no clubbing cyanosis or edema skin warm and dry Neuro/Psych; alert and oriented x4, grossly nonfocal mood and affect appropriate       Assessment & Plan:   #61 75 year old white female with history of adenomatous colon polyps-due for follow-up colonoscopy, currently asymptomatic #2 family history of colon cancer #3 history of chronic GERD status post remote Nissen fundoplication in 1610 #4 new onset over the past 2 months of refractory cough, frequently to the point of gagging.  This is this occurring daytime and nighttime and frequently seems to be triggered by eating particularly with solid foods or cold foods. She is not having any definite dysphagia but does have a sense of constant fullness and irritation in the throat/upper esophagus. 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  History of breast cancer 2015 7.  History of melanoma 8.  Fibromyalgia 9.  Mono allelic MUTYH gene  Plan; Patient will be scheduled for barium swallow with tablet as initial study We discussed strict antireflux regimen with diet, upright positioning postprandially for at least an hour, n.p.o. for 2 to 3 hours prior to bedtime, elevation of the back 45 degrees for sleep. Continue Protonix 40 mg p.o. twice daily AC breakfast and AC dinner, refills sent Advised patient to try over-the-counter plain Mucinex twice daily to thin secretions which may help with fullness and mucus sensation in the throat She will be scheduled for colonoscopy and EGD with Dr. Carlean Purl.  Procedure is scheduled for August.  Depending on results of barium swallow we can decide if necessary to proceed with EGD.  Procedures were discussed in detail  with the patient including indications risk and benefits and she is agreeable to proceed.  Barnard Sharps Genia Harold PA-C 10/03/2020   Cc: Brunetta Jeans, PA-C

## 2020-10-03 NOTE — Patient Instructions (Signed)
If you are age 75 or older, your body mass index should be between 23-30. Your Body mass index is 30.15 kg/m. If this is out of the aforementioned range listed, please consider follow up with your Primary Care Provider.  If you are age 74 or younger, your body mass index should be between 19-25. Your Body mass index is 30.15 kg/m. If this is out of the aformentioned range listed, please consider follow up with your Primary Care Provider.   You have been scheduled for an endoscopy and colonoscopy. Please follow the written instructions given to you at your visit today. Please pick up your prep supplies at the pharmacy within the next 1-3 days. If you use inhalers (even only as needed), please bring them with you on the day of your procedure.  You have been scheduled for a Barium Esophogram at [facility] (1st floor of the hospital) on 10/12/2020 at 11:00 am. Please arrive 15 minutes prior to your appointment for registration. Make certain not to have anything to eat or drink 3 hours prior to your test. If you need to reschedule for any reason, please contact radiology at [phone] to do so. __________________________________________________________________ A barium swallow is an examination that concentrates on views of the esophagus. This tends to be a double contrast exam (barium and two liquids which, when combined, create a gas to distend the wall of the oesophagus) or single contrast (non-ionic iodine based). The study is usually tailored to your symptoms so a good history is essential. Attention is paid during the study to the form, structure and configuration of the esophagus, looking for functional disorders (such as aspiration, dysphagia, achalasia, motility and reflux) EXAMINATION You may be asked to change into a gown, depending on the type of swallow being performed. A radiologist and radiographer will perform the procedure. The radiologist will advise you of the type of  contrast selected for your procedure and direct you during the exam. You will be asked to stand, sit or lie in several different positions and to hold a small amount of fluid in your mouth before being asked to swallow while the imaging is performed .In some instances you may be asked to swallow barium coated marshmallows to assess the motility of a solid food bolus. The exam can be recorded as a digital or video fluoroscopy procedure. POST PROCEDURE It will take 1-2 days for the barium to pass through your system. To facilitate this, it is important, unless otherwise directed, to increase your fluids for the next 24-48hrs and to resume your normal diet.  This test typically takes about 30 minutes to perform. __________________________________________________________________________________   Use OTC Mucinex twice daily.  We have sent in refills of Pantoprazole to your Pharmacy.  Follow a strict anti-reflux diet.  Follow up pending on the results of your Barium swallow, Colonoscopy, and Endoscopy or as needed.  Thank you for entrusting me with your care and choosing [facility].  Amy Esterwood, PA-C

## 2020-10-09 ENCOUNTER — Telehealth: Payer: Self-pay

## 2020-10-09 DIAGNOSIS — N200 Calculus of kidney: Secondary | ICD-10-CM | POA: Insufficient documentation

## 2020-10-09 DIAGNOSIS — E538 Deficiency of other specified B group vitamins: Secondary | ICD-10-CM | POA: Insufficient documentation

## 2020-10-09 DIAGNOSIS — Z8719 Personal history of other diseases of the digestive system: Secondary | ICD-10-CM | POA: Insufficient documentation

## 2020-10-09 DIAGNOSIS — R0600 Dyspnea, unspecified: Secondary | ICD-10-CM | POA: Insufficient documentation

## 2020-10-09 DIAGNOSIS — T8859XA Other complications of anesthesia, initial encounter: Secondary | ICD-10-CM | POA: Insufficient documentation

## 2020-10-09 DIAGNOSIS — G8929 Other chronic pain: Secondary | ICD-10-CM | POA: Insufficient documentation

## 2020-10-09 DIAGNOSIS — K219 Gastro-esophageal reflux disease without esophagitis: Secondary | ICD-10-CM | POA: Insufficient documentation

## 2020-10-09 DIAGNOSIS — K317 Polyp of stomach and duodenum: Secondary | ICD-10-CM | POA: Insufficient documentation

## 2020-10-09 DIAGNOSIS — F32A Depression, unspecified: Secondary | ICD-10-CM | POA: Insufficient documentation

## 2020-10-09 DIAGNOSIS — M549 Dorsalgia, unspecified: Secondary | ICD-10-CM | POA: Insufficient documentation

## 2020-10-09 DIAGNOSIS — F419 Anxiety disorder, unspecified: Secondary | ICD-10-CM | POA: Insufficient documentation

## 2020-10-09 DIAGNOSIS — M1990 Unspecified osteoarthritis, unspecified site: Secondary | ICD-10-CM | POA: Insufficient documentation

## 2020-10-09 DIAGNOSIS — D759 Disease of blood and blood-forming organs, unspecified: Secondary | ICD-10-CM | POA: Insufficient documentation

## 2020-10-09 DIAGNOSIS — Z8619 Personal history of other infectious and parasitic diseases: Secondary | ICD-10-CM | POA: Insufficient documentation

## 2020-10-09 NOTE — Chronic Care Management (AMB) (Signed)
    Chronic Care Management Pharmacy Assistant   Name: RAYMONDE HAMBLIN  MRN: [id number] DOB: Mar 23, 1946  Reason for Encounter: Medication Coordination Call  Recent office visits:  09/21/20- Andris Flurry for fatigue, started protonix 40 mg bid, labs ordered, follow up with gastro, referral to cardiology  Recent consult visits:  10/03/20- Amy Trellis Paganini, PA-C Gertie Fey)- Seen for follow care of Gastroesophageal reflux disease without esophagitis, recommended use of Mucinex twice daily, scheduled colonoscopy and EGD pending results of barium swallow, anti-reflux regimen with diet discussed, follow up pending results or prn  Hospital visits:  None in previous 6 months  Medications: Outpatient Encounter Medications as of 10/09/2020  Medication Sig  . Acetaminophen (TYLENOL EXTRA STRENGTH PO) Take 1,000 mg by mouth every 8 (eight) hours as needed.  . Cholecalciferol (VITAMIN D3 PO) Take 6,000 Units by mouth daily.   . citalopram (CELEXA) 20 MG tablet Take 1 tablet (20 mg total) by mouth daily.  . cyanocobalamin 2000 MCG tablet Take 1.5 tablets (3,000 mcg total) by mouth daily.  . fluticasone (FLONASE) 50 MCG/ACT nasal spray Place 2 sprays into both nostrils daily.  . furosemide (LASIX) 40 MG tablet Take 1 tablet (40 mg total) by mouth daily.  . naproxen sodium (ALEVE) 220 MG tablet Take 220 mg by mouth as needed.  . pantoprazole (PROTONIX) 40 MG tablet Take 1 tablet (40 mg total) by mouth 2 (two) times daily.   No facility-administered encounter medications on file as of 10/09/2020.    Reviewed chart for medication changes ahead of medication coordination call.  No OVs, Consults, or hospital visits since last care coordination call/Pharmacist visit. (If appropriate, list visit date, provider name)  No medication changes indicated OR if recent visit, treatment plan here.  BP Readings from Last 3 Encounters:  10/03/20 130/90  09/21/20 (!) 142/76  03/23/20 135/87    Lab Results   Component Value Date   HGBA1C 5.6 02/17/2019     Patient obtains medications through Adherence Packaging  90 Days   Last adherence delivery included:  Citalopram 20 mg; one tablet every morning Furosemide 40 mg; one tablet every morning  Patient is due for next adherence delivery on: 10/18/20 Called patient and reviewed medications and coordinated delivery.  This delivery to include: Citalopram 20 mg; one tablet every morning Furosemide 40 mg; one tablet every morning Pantoprazole 40 mg- take one tablet with breakfast and with supper   Patient needs refills for  Citalopram 20 mg; one tablet every morning Furosemide 40 mg; one tablet every morning  Confirmed delivery date of 10/18/20, advised patient that pharmacy will contact them the morning of delivery.  Wilford Sports CPA, CMA

## 2020-10-12 ENCOUNTER — Other Ambulatory Visit: Payer: Self-pay

## 2020-10-12 ENCOUNTER — Ambulatory Visit (HOSPITAL_COMMUNITY)
Admission: RE | Admit: 2020-10-12 | Discharge: 2020-10-12 | Disposition: A | Discharge status: Home / Self Care | Payer: Medicare HMO | Source: Ambulatory Visit | Attending: Physician Assistant | Admitting: Physician Assistant

## 2020-10-12 DIAGNOSIS — R053 Chronic cough: Secondary | ICD-10-CM

## 2020-10-12 DIAGNOSIS — R1310 Dysphagia, unspecified: Secondary | ICD-10-CM

## 2020-10-12 DIAGNOSIS — K219 Gastro-esophageal reflux disease without esophagitis: Secondary | ICD-10-CM | POA: Diagnosis not present

## 2020-10-12 DIAGNOSIS — R059 Cough, unspecified: Secondary | ICD-10-CM | POA: Diagnosis not present

## 2020-10-26 ENCOUNTER — Other Ambulatory Visit: Payer: Self-pay

## 2020-10-26 ENCOUNTER — Emergency Department (HOSPITAL_BASED_OUTPATIENT_CLINIC_OR_DEPARTMENT_OTHER): Payer: Medicare HMO

## 2020-10-26 ENCOUNTER — Observation Stay (HOSPITAL_BASED_OUTPATIENT_CLINIC_OR_DEPARTMENT_OTHER)
Admission: EM | Admit: 2020-10-26 | Discharge: 2020-10-27 | Disposition: A | Discharge status: Home / Self Care | Payer: Medicare HMO | Attending: Internal Medicine | Admitting: Internal Medicine

## 2020-10-26 ENCOUNTER — Telehealth: Payer: Self-pay | Admitting: Cardiology

## 2020-10-26 ENCOUNTER — Encounter (HOSPITAL_BASED_OUTPATIENT_CLINIC_OR_DEPARTMENT_OTHER): Payer: Self-pay

## 2020-10-26 ENCOUNTER — Inpatient Hospital Stay (HOSPITAL_COMMUNITY): Payer: Medicare HMO

## 2020-10-26 DIAGNOSIS — R079 Chest pain, unspecified: Secondary | ICD-10-CM | POA: Diagnosis not present

## 2020-10-26 DIAGNOSIS — Z853 Personal history of malignant neoplasm of breast: Secondary | ICD-10-CM | POA: Insufficient documentation

## 2020-10-26 DIAGNOSIS — I639 Cerebral infarction, unspecified: Secondary | ICD-10-CM | POA: Diagnosis not present

## 2020-10-26 DIAGNOSIS — R42 Dizziness and giddiness: Principal | ICD-10-CM

## 2020-10-26 DIAGNOSIS — G629 Polyneuropathy, unspecified: Secondary | ICD-10-CM

## 2020-10-26 DIAGNOSIS — R0789 Other chest pain: Secondary | ICD-10-CM | POA: Diagnosis not present

## 2020-10-26 DIAGNOSIS — I10 Essential (primary) hypertension: Secondary | ICD-10-CM | POA: Diagnosis present

## 2020-10-26 DIAGNOSIS — Z20822 Contact with and (suspected) exposure to covid-19: Secondary | ICD-10-CM | POA: Insufficient documentation

## 2020-10-26 DIAGNOSIS — I160 Hypertensive urgency: Secondary | ICD-10-CM | POA: Insufficient documentation

## 2020-10-26 DIAGNOSIS — Z79899 Other long term (current) drug therapy: Secondary | ICD-10-CM | POA: Insufficient documentation

## 2020-10-26 DIAGNOSIS — K219 Gastro-esophageal reflux disease without esophagitis: Secondary | ICD-10-CM | POA: Diagnosis present

## 2020-10-26 HISTORY — DX: Cerebral infarction, unspecified: I63.9

## 2020-10-26 LAB — URINALYSIS, ROUTINE W REFLEX MICROSCOPIC
Bilirubin Urine: NEGATIVE
Glucose, UA: NEGATIVE mg/dL
Hgb urine dipstick: NEGATIVE
Ketones, ur: NEGATIVE mg/dL
Leukocytes,Ua: NEGATIVE
Nitrite: NEGATIVE
Protein, ur: NEGATIVE mg/dL
Specific Gravity, Urine: <1.005 — ABNORMAL LOW (ref 1.005–1.030)
pH: 6.5 (ref 5.0–8.0)

## 2020-10-26 LAB — RESP PANEL BY RT-PCR (FLU A&B, COVID) ARPGX2
Influenza A by PCR: NEGATIVE
Influenza B by PCR: NEGATIVE
SARS Coronavirus 2 by RT PCR: NEGATIVE

## 2020-10-26 LAB — COMPREHENSIVE METABOLIC PANEL
ALT: 14 U/L (ref 0–44)
AST: 21 U/L (ref 15–41)
Albumin: 4.3 g/dL (ref 3.5–5.0)
Alkaline Phosphatase: 61 U/L (ref 38–126)
Anion gap: 11 (ref 5–15)
BUN: 13 mg/dL (ref 8–23)
CO2: 27 mmol/L (ref 22–32)
Calcium: 8.9 mg/dL (ref 8.9–10.3)
Chloride: 103 mmol/L (ref 98–111)
Creatinine, Ser: 0.82 mg/dL (ref 0.44–1.00)
GFR, Estimated: >60 mL/min (ref >60)
Glucose, Bld: 96 mg/dL (ref 70–99)
Potassium: 3.6 mmol/L (ref 3.5–5.1)
Sodium: 141 mmol/L (ref 135–145)
Total Bilirubin: 0.5 mg/dL (ref 0.3–1.2)
Total Protein: 7.2 g/dL (ref 6.5–8.1)

## 2020-10-26 LAB — CBC
HCT: 43.9 % (ref 36.0–46.0)
HCT: 44.5 % (ref 36.0–46.0)
Hemoglobin: 14.6 g/dL (ref 12.0–15.0)
Hemoglobin: 15.1 g/dL — ABNORMAL HIGH (ref 12.0–15.0)
MCH: 30.4 pg (ref 26.0–34.0)
MCH: 30.9 pg (ref 26.0–34.0)
MCHC: 33.3 g/dL (ref 30.0–36.0)
MCHC: 33.9 g/dL (ref 30.0–36.0)
MCV: 91.3 fL (ref 80.0–100.0)
MCV: 91.2 fL (ref 80.0–100.0)
Platelets: 197 10*3/uL (ref 150–400)
Platelets: 203 10*3/uL (ref 150–400)
RBC: 4.81 MIL/uL (ref 3.87–5.11)
RBC: 4.88 MIL/uL (ref 3.87–5.11)
RDW: 12.9 % (ref 11.5–15.5)
RDW: 13.1 % (ref 11.5–15.5)
WBC: 5.7 10*3/uL (ref 4.0–10.5)
WBC: 7.9 10*3/uL (ref 4.0–10.5)
nRBC: 0 % (ref 0.0–0.2)
nRBC: 0 % (ref 0.0–0.2)

## 2020-10-26 LAB — DIFFERENTIAL
Abs Immature Granulocytes: 0.01 10*3/uL (ref 0.00–0.07)
Basophils Absolute: 0.1 10*3/uL (ref 0.0–0.1)
Basophils Relative: 1 %
Eosinophils Absolute: 0.4 10*3/uL (ref 0.0–0.5)
Eosinophils Relative: 6 %
Immature Granulocytes: 0 %
Lymphocytes Relative: 29 %
Lymphs Abs: 1.6 10*3/uL (ref 0.7–4.0)
Monocytes Absolute: 0.4 10*3/uL (ref 0.1–1.0)
Monocytes Relative: 7 %
Neutro Abs: 3.2 10*3/uL (ref 1.7–7.7)
Neutrophils Relative %: 57 %

## 2020-10-26 LAB — RAPID URINE DRUG SCREEN, HOSP PERFORMED
Amphetamines: NOT DETECTED
Barbiturates: NOT DETECTED
Benzodiazepines: NOT DETECTED
Cocaine: NOT DETECTED
Opiates: NOT DETECTED
Tetrahydrocannabinol: NOT DETECTED

## 2020-10-26 LAB — CREATININE, SERUM
Creatinine, Ser: 0.78 mg/dL (ref 0.44–1.00)
GFR, Estimated: >60 mL/min (ref >60)

## 2020-10-26 LAB — TROPONIN I (HIGH SENSITIVITY)
Troponin I (High Sensitivity): 3 ng/L (ref <18)
Troponin I (High Sensitivity): 3 ng/L (ref <18)

## 2020-10-26 LAB — CBG MONITORING, ED: Glucose-Capillary: 102 mg/dL — ABNORMAL HIGH (ref 70–99)

## 2020-10-26 LAB — PROTIME-INR
INR: 1 (ref 0.8–1.2)
Prothrombin Time: 12.7 seconds (ref 11.4–15.2)

## 2020-10-26 LAB — APTT: aPTT: 27 seconds (ref 24–36)

## 2020-10-26 MED ORDER — SENNOSIDES-DOCUSATE SODIUM 8.6-50 MG PO TABS: 1.0000 | ORAL_TABLET | Freq: Every evening | ORAL | Status: DC | PRN

## 2020-10-26 MED ORDER — ACETAMINOPHEN 160 MG/5ML PO SOLN: 650.0000 mg | ORAL | Status: DC | PRN

## 2020-10-26 MED ORDER — ACETAMINOPHEN 500 MG PO TABS
1000.0000 mg | ORAL_TABLET | Freq: Once | ORAL | Status: AC
  Administered 2020-10-26: 1000 mg via ORAL
  Filled 2020-10-26: qty 2

## 2020-10-26 MED ORDER — STROKE: EARLY STAGES OF RECOVERY BOOK
Freq: Once | Status: AC
  Filled 2020-10-26: qty 1

## 2020-10-26 MED ORDER — FUROSEMIDE 40 MG PO TABS
40.0000 mg | ORAL_TABLET | Freq: Once | ORAL | Status: AC
  Administered 2020-10-26: 40 mg via ORAL
  Filled 2020-10-26: qty 1

## 2020-10-26 MED ORDER — ASPIRIN EC 325 MG PO TBEC
325.0000 mg | DELAYED_RELEASE_TABLET | Freq: Every day | ORAL | Status: DC
  Administered 2020-10-26 – 2020-10-27 (×2): 325 mg via ORAL
  Filled 2020-10-26 (×3): qty 1

## 2020-10-26 MED ORDER — PANTOPRAZOLE SODIUM 40 MG PO TBEC
40.0000 mg | DELAYED_RELEASE_TABLET | Freq: Two times a day (BID) | ORAL | Status: DC
  Administered 2020-10-26 – 2020-10-27 (×2): 40 mg via ORAL
  Filled 2020-10-26 (×3): qty 1

## 2020-10-26 MED ORDER — HYDRALAZINE HCL 20 MG/ML IJ SOLN
20.0000 mg | Freq: Once | INTRAMUSCULAR | Status: AC
  Administered 2020-10-26: 20 mg via INTRAVENOUS
  Filled 2020-10-26: qty 1

## 2020-10-26 MED ORDER — VITAMIN B-12 1000 MCG PO TABS
3000.0000 ug | ORAL_TABLET | Freq: Every day | ORAL | Status: DC
  Administered 2020-10-26 – 2020-10-27 (×2): 3000 ug via ORAL
  Filled 2020-10-26 (×3): qty 3

## 2020-10-26 MED ORDER — FLUTICASONE PROPIONATE 50 MCG/ACT NA SUSP
2.0000 | Freq: Every day | NASAL | Status: DC
  Administered 2020-10-26: 2 via NASAL
  Filled 2020-10-26: qty 16

## 2020-10-26 MED ORDER — ACETAMINOPHEN 325 MG PO TABS: 650.0000 mg | ORAL_TABLET | ORAL | Status: DC | PRN

## 2020-10-26 MED ORDER — ENOXAPARIN SODIUM 40 MG/0.4ML IJ SOSY
40.0000 mg | PREFILLED_SYRINGE | INTRAMUSCULAR | Status: DC
  Administered 2020-10-26: 40 mg via SUBCUTANEOUS
  Filled 2020-10-26: qty 0.4

## 2020-10-26 MED ORDER — CITALOPRAM HYDROBROMIDE 10 MG PO TABS
20.0000 mg | ORAL_TABLET | Freq: Every day | ORAL | Status: DC
  Administered 2020-10-26 – 2020-10-27 (×2): 20 mg via ORAL
  Filled 2020-10-26 (×3): qty 2

## 2020-10-26 MED ORDER — SODIUM CHLORIDE 0.9 % IV SOLN: INTRAVENOUS | Status: DC

## 2020-10-26 MED ORDER — ACETAMINOPHEN 650 MG RE SUPP: 650.0000 mg | RECTAL | Status: DC | PRN

## 2020-10-26 MED ORDER — GADOBUTROL 1 MMOL/ML IV SOLN
8.0000 mL | Freq: Once | INTRAVENOUS | Status: AC | PRN
  Administered 2020-10-26: 8 mL via INTRAVENOUS

## 2020-10-26 NOTE — ED Provider Notes (Signed)
[facility] EMERGENCY DEPARTMENT Provider Note   CSN: [id number] Arrival date & time: 10/26/20  0946     History Chief Complaint  Patient presents with  . Dizziness    [name] is a 75 y.o. female with PMHx HTN, GERD, hx of Nissen fundoplication (0092), anxiety, depression who presents to the ED Today with complaints of intermittent dizziness/off balanced feeling for the past 1 week. Pt also endorses increased fatigue. She states that she checked her blood pressure yesterday and it read HIGH. She checked it again today and it also read HIGH. She went to [facility] to check her BP and reports a reading in the 330Q systolic. She called her PCP and was advised to come to the ED for further evaluation. Pt mentions that she was recently on a trip and did not take her Lasix for 3 days due to not wanting to urinate frequently. She did take it yesterday however did not notice any change in her BP. She did not take it today. Pt also complains of some intermittent blurry vision and episodes of forgetfullness. She mentions that for about 6 months she has noticed weakness on the right side of her body with frequently dropping objects. She does not believe she has mentioned it to her PCP.  Pt also mentions intermittent chest heaviness/tightness for the past month. She reports she had a swallow study done with Dr. Carlean Purl due to hx of nissen fundoplication and was told that her chest pains were likely from spasming of the esophagus. She is having some mild chest pressure now. She also endorses SOB with the chest pressure.   Pt denies fevers, chills, HA, double vision, nausea, vomiting, speech changes, or any other associated symptoms.   The history is provided by the patient and medical records.       Past Medical History:  Diagnosis Date  . Arthritis    "spine" (07/28/2014)  . Blood dyscrasia    bruises and bleed easily  . Breast cancer, left breast ([place]) 07/28/2014  . Breast cancer, right  breast ([place]) 1995  . Chronic back pain    "all over"  . Complication of anesthesia    went to sleep easily but hard to wake up up until elbow OR in 2010  . Depression   . Dyspnea    Normal Spirometry 03/2008 EF 65% BNP normal 11/2007  . Family history of breast cancer   . Family history of colon cancer   . Family history of pancreatic cancer   . Gastric polyps   . GERD (gastroesophageal reflux disease)   . History of chicken pox   . History of hiatal hernia   . Hx of adenomatous polyp of colon 07/03/2015  . Kidney stone    right kidney   . Lichen sclerosus   . Melanoma ([place]) 2010   "right elbow; treated at [facility]"  . Mild anxiety   . Vitamin B12 deficiency   . Vitamin D deficiency     Patient Active Problem List   Diagnosis Date Noted  . CVA (cerebral vascular accident) ([place]) 10/26/2020  . Vitamin B12 deficiency   . Mild anxiety   . Kidney stone   . History of hiatal hernia   . History of chicken pox   . GERD (gastroesophageal reflux disease)   . Gastric polyps   . Dyspnea   . Depression   . Complication of anesthesia   . Chronic back pain   . Blood dyscrasia   .  Arthritis   . Ankle effusion, left 03/23/2020  . Rheumatoid arthritis ([place]) 11/02/2018  . Episode of recurrent major depressive disorder ([place]) 12/05/2017  . Primary osteoarthritis of both hands 07/09/2017  . Primary osteoarthritis of both knees 07/09/2017  . DDD (degenerative disc disease), cervical 07/09/2017  . DDD (degenerative disc disease), lumbar 07/09/2017  . Peripheral edema 11/26/2016  . Medicare annual wellness visit, subsequent 06/30/2016  . Hx of adenomatous polyp of colon 07/03/2015  . 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 Breast cancer, left breast ([place]) 07/28/2014  . Family history of breast cancer   . Family history of colon cancer   . Family history of pancreatic cancer   . Malignant neoplasm of upper-outer quadrant of left breast in female, estrogen receptor positive ([place])  05/16/2014  . Peripheral neuropathy 01/01/2013  . Melanoma of upper arm ([place]) 02/10/2012  . S/P laparoscopic fundoplication 17/61/6073  . Steatosis of liver 12/07/2009  . COAGULOPATHY 07/12/2008  . Melanoma ([place]) 2010  . CHEST XRAY, ABNORMAL 01/04/2008  . Vitamin D deficiency 04/14/2007  . VITAMIN B12 DEFICIENCY 02/27/2007  . Essential hypertension 02/27/2007  . GERD 02/27/2007  . Fibromyalgia 02/27/2007  . Breast cancer, right breast ([place]) 1995    Past Surgical History:  Procedure Laterality Date  . BREAST BIOPSY Left 04/2014  . BREAST RECONSTRUCTION WITH PLACEMENT OF TISSUE EXPANDER AND FLEX HD (ACELLULAR HYDRATED DERMIS) Left 07/28/2014   Procedure: LEFT BREAST RECONSTRUCTION PLACEMENT OF LEFT TISSUE EXPANDER ;  Surgeon: Crissie Reese, MD;  Location: [facility];  Service: Plastics;  Laterality: Left;  . BREAST RECONSTRUCTION WITH PLACEMENT OF TISSUE EXPANDER AND FLEX HD (ACELLULAR HYDRATED DERMIS) Left 09/08/2014   Procedure: REMOVAL OF TISSUE EXPANDER FROM LEFT BREAST;  Surgeon: Crissie Reese, MD;  Location: [facility];  Service: Plastics;  Laterality: Left;  . BUNIONECTOMY Bilateral 1970's  . COLONOSCOPY     Dr Sharlett Iles  . CYSTOSCOPY WITH RETROGRADE PYELOGRAM, URETEROSCOPY AND STENT PLACEMENT Right 06/09/2015   Procedure: CYSTOSCOPY WITH RIGHT RETROGRADE PYELOGRAM, RIGHT URETEROSCOPY AND RIGHT URTERAL STENT PLACEMENT;  Surgeon: Ardis Hughs, MD;  Location: [facility];  Service: Urology;  Laterality: Right;  . ESOPHAGOGASTRODUODENOSCOPY    . HERNIA REPAIR    . HOLMIUM LASER APPLICATION Right 12/04/6267   Procedure: HOLMIUM LASER APPLICATION;  Surgeon: Ardis Hughs, MD;  Location: [facility];  Service: Urology;  Laterality: Right;  . LATISSIMUS FLAP TO BREAST Left 09/08/2014   Procedure: LEFT LATISSIMUS FLAP TO BREAST WITH SALINE IMPLANT FOR BREAST RECONSTRUCTION;  Surgeon: Crissie Reese, MD;  Location: [facility];  Service: Plastics;  Laterality: Left;  . LUMBAR FUSION  01/2018   L5-S1  transitional segmental laminectomy and Fusion  . MASTECTOMY Right 1996    chemotherapy. pt. states 13 lymph nodes were removed  . MASTECTOMY COMPLETE / SIMPLE W/ SENTINEL NODE BIOPSY Left 07/28/2014  . MASTECTOMY W/ SENTINEL NODE BIOPSY Left 07/28/2014   Procedure: LEFT MASTECTOMY WITH SENTINEL LYMPH NODE MAPPING;  Surgeon: Autumn Messing III, MD;  Location: [facility];  Service: General;  Laterality: Left;  Marland Kitchen MELANOMA EXCISION Right 2010   From elbow-- Done at [facility]   . NISSEN FUNDOPLICATION  48/5462  . RECONSTRUCTION BREAST IMMEDIATE / DELAYED W/ TISSUE EXPANDER Left 07/28/2014  . TEMPOROMANDIBULAR JOINT SURGERY Bilateral 1987  . TONSILLECTOMY       OB History   No obstetric history on file.     Family History  Problem Relation Age of Onset  . Colon cancer Cousin 58  double first cousin  . Colon cancer Cousin 48       double first cousin  . Colon polyps Father        between 67-20  . Dementia Father   . CAD Father        CABG at age 31  . Stroke Mother   . Pancreatic cancer Brother 82  . Colon polyps Brother        between 10-20  . Cancer Paternal Uncle        NOS  . Anemia Paternal Grandfather        pernicious anemia  . Breast cancer Cousin 13       double first cousin  . Breast cancer Cousin 74       paternal cousin  . Stroke Other        F 1st degree relative 61, M 1st degree relative  . Esophageal cancer Neg Hx   . Stomach cancer Neg Hx     Social History   Tobacco Use  . Smoking status: Never Smoker  . Smokeless tobacco: Never Used  . Tobacco comment: Regular Exercise - Yes  Vaping Use  . Vaping Use: Never used  Substance Use Topics  . Alcohol use: No  . Drug use: No    Home Medications Prior to Admission medications   Medication Sig Start Date End Date Taking? Authorizing Provider  Acetaminophen (TYLENOL EXTRA STRENGTH PO) Take 1,000 mg by mouth every 8 (eight) hours as needed. 09/28/19   [provider]  Cholecalciferol (VITAMIN D3 PO) Take 6,000  Units by mouth daily.     [provider]  citalopram (CELEXA) 20 MG tablet Take 1 tablet (20 mg total) by mouth daily. 01/07/20   Brunetta Jeans, PA-C  cyanocobalamin 2000 MCG tablet Take 1.5 tablets (3,000 mcg total) by mouth daily. 01/28/20   Brunetta Jeans, PA-C  fluticasone (FLONASE) 50 MCG/ACT nasal spray Place 2 sprays into both nostrils daily. 09/21/20   Marrian Salvage, FNP  furosemide (LASIX) 40 MG tablet Take 1 tablet (40 mg total) by mouth daily. 04/18/20   Brunetta Jeans, PA-C  naproxen sodium (ALEVE) 220 MG tablet Take 220 mg by mouth as needed.    [provider]  pantoprazole (PROTONIX) 40 MG tablet Take 1 tablet (40 mg total) by mouth 2 (two) times daily. 10/03/20   Esterwood, Amy S, PA-C    Allergies    Other, Silodosin, Ace inhibitors, Buprenorphine hcl, Morphine and related, Codeine, Sulfonamide derivatives, and Telmisartan  Review of Systems   Review of Systems  Constitutional: Negative for chills and fever.  Eyes: Positive for visual disturbance (blurry vision).  Respiratory: Positive for shortness of breath.   Cardiovascular: Positive for chest pain.  Gastrointestinal: Negative for abdominal pain, nausea and vomiting.  Musculoskeletal: Negative for myalgias and neck pain.  Skin: Negative for rash.  Neurological: Positive for dizziness, weakness (right side x 6 months) and light-headedness. Negative for syncope, speech difficulty, numbness and headaches.  All other systems reviewed and are negative.   Physical Exam Updated Vital Signs BP (!) 196/106 (BP Location: Left Arm)   Pulse 78   Temp 98.6 F (37 C) (Oral)   Resp 16   Ht 5\' 5"  (1.651 m)   Wt 82.6 kg   SpO2 97%   BMI 30.29 kg/m   Physical Exam Vitals and nursing note reviewed.  Constitutional:      Appearance: She is not ill-appearing or diaphoretic.  HENT:  Head: Normocephalic and atraumatic.  Eyes:     Extraocular Movements: Extraocular movements intact.      Conjunctiva/sclera: Conjunctivae normal.     Pupils: Pupils are equal, round, and reactive to light.  Cardiovascular:     Rate and Rhythm: Normal rate and regular rhythm.     Pulses: Normal pulses.  Pulmonary:     Effort: Pulmonary effort is normal.     Breath sounds: Normal breath sounds. No wheezing, rhonchi or rales.  Abdominal:     Palpations: Abdomen is soft.     Tenderness: There is no abdominal tenderness. There is no guarding or rebound.  Musculoskeletal:     Cervical back: Normal range of motion and neck supple. No rigidity.     Right lower leg: No edema.     Left lower leg: No edema.  Skin:    General: Skin is warm and dry.  Neurological:     Mental Status: She is alert.     Comments: Alert and oriented to self, place, time and event.   Speech is fluent, clear without dysarthria or dysphasia.   Strength 5/5 in upper/lower extremities  Subjective decreased sensation to right face, RUE, and RLE.   + right pronator drift.  Normal finger-to-nose and feet tapping.  CN I not tested  CN II grossly intact visual fields bilaterally. Did not visualize posterior eye.   CN III, IV, VI PERRLA and EOMs intact bilaterally  CN V Intact sensation to sharp and light touch to the face  CN VII facial movements symmetric  CN VIII not tested  CN IX, X no uvula deviation, symmetric rise of soft palate  CN XI 5/5 SCM and trapezius strength bilaterally  CN XII Midline tongue protrusion, symmetric L/R movements      ED Results / Procedures / Treatments   Labs (all labs ordered are listed, but only abnormal results are displayed) Labs Reviewed  CBG MONITORING, ED - Abnormal; Notable for the following components:      Result Value   Glucose-Capillary 102 (*)    All other components within normal limits  RESP PANEL BY RT-PCR (FLU A&B, COVID) ARPGX2  PROTIME-INR  APTT  CBC  DIFFERENTIAL  COMPREHENSIVE METABOLIC PANEL  RAPID URINE DRUG SCREEN, HOSP PERFORMED  URINALYSIS, ROUTINE W  REFLEX MICROSCOPIC  TROPONIN I (HIGH SENSITIVITY)  TROPONIN I (HIGH SENSITIVITY)    EKG EKG Interpretation  Date/Time:  Thursday October 26 2020 09:57:27 EDT Ventricular Rate:  71 PR Interval:  130 QRS Duration: 80 QT Interval:  401 QTC Calculation: 436 R Axis:   41 Text Interpretation: Sinus rhythm No significant change since last tracing Confirmed by Theotis Burrow [phone]) on 10/26/2020 9:59:28 AM   Radiology CT HEAD WO CONTRAST  Result Date: 10/26/2020 CLINICAL DATA:  Dizziness.  Hypertension EXAM: CT HEAD WITHOUT CONTRAST TECHNIQUE: Contiguous axial images were obtained from the base of the skull through the vertex without intravenous contrast. COMPARISON:  April 05, 2013 FINDINGS: Brain: Ventricles and sulci are normal in size and configuration. There is no appreciable intracranial mass, hemorrhage, extra-axial fluid collection, or midline shift. There is decreased attenuation in the head of the caudate nucleus on the right, a finding not present previously. There are patchy areas of decreased attenuation in the centra semiovale bilaterally. Mild decreased attenuation is noted in the anterior limb of each internal capsule. Vascular: No hyperdense vessel. There is calcification in each carotid siphon region. Skull: Bony calvarium appears intact. Sinuses/Orbits: There is mucosal thickening in several ethmoid  air cells. Other visualized paranasal sinuses are clear visualized orbits appear symmetric bilaterally. Other: Visualized mastoid air cells are clear. IMPRESSION: 1. 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 2. Patchy periventricular small vessel disease in the centra semiovale bilaterally. Small vessel disease in each internal capsule noted. 3.  No evident mass or hemorrhage. 4.  There are foci of arterial vascular calcification. 5.  Mucosal thickening noted in several ethmoid air cells. Electronically Signed   By: Lowella Grip  III M.D.   On: 10/26/2020 10:40   DG Chest Port 1 View  Result Date: 10/26/2020 CLINICAL DATA:  Chest pain and hypertension EXAM: PORTABLE CHEST 1 VIEW COMPARISON:  Chest CT Oct 02, 2020. FINDINGS: No edema or airspace opacity. Heart is upper normal in size with pulmonary vascularity normal. No adenopathy. Surgical clips noted over left breast region. No pneumothorax. IMPRESSION: No edema or airspace opacity.  Heart upper normal in size. Electronically Signed   By: Lowella Grip III M.D.   On: 10/26/2020 10:41    Procedures Procedures   Medications Ordered in ED Medications  furosemide (LASIX) tablet 40 mg (40 mg Oral Given 10/26/20 1022)  hydrALAZINE (APRESOLINE) injection 20 mg (20 mg Intravenous Given 10/26/20 1107)    ED Course  I have reviewed the triage vital signs and the nursing notes.  Pertinent labs & imaging results that were available during my care of the patient were reviewed by me and considered in my medical decision making (see chart for details).    MDM Rules/Calculators/A&P                          75 year old female who presents to the ED today with complaints of intermittent dizziness/lightheadedness for the past week.  Is also been having blurry vision and episodes of confusion.  Has had intermittent chest tightness for the past month, she attributes this to spasming of her esophagus from recent swallow study.  Past medical history of Nissen fundoplication.  Was having some high blood pressure today prompting her to come to the ED for further evaluation.  On arrival to the ED patient is afebrile, nontachycardic and nontachypneic.  Blood pressure is significantly elevated at 196/106.  She had not been taking her Lasix for 3 days, took it yesterday however did not take it today.  Not on any other antihypertensive medications.  On my exam patient has decreased sensation to right face, right upper extremity/right lower extremity.  She reports weakness for the past 6 months  however has not mentioned this to her PCP.  Denies history of stroke.  She does have noticeable small drift on right side as well.  No facial asymmetry, speech intact.  Will work-up for stroke at this time.  Will add on troponin given complaint of chest tightness for the past month.  Will provide patient's usual dose of Lasix and reevaluate blood pressure.   EKG no acute ischemic changes CBC without leukocytosis. Hgb stable at 14.6 CMP without electrolyte abnormalities  CT Head: IMPRESSION:  1. Age uncertain but potentially recent/acute infarct in the head of  the caudate nucleus on the right.    2. Patchy periventricular small vessel disease in the centra  semiovale bilaterally. Small vessel disease in each internal capsule  noted.    3. No evident mass or hemorrhage.    4. There are foci of arterial vascular calcification.    5. Mucosal thickening  noted in several ethmoid air cells.   Blood pressure still elevated despite lasix; hydralazine ordered at this time. While in the room updating pt blood pressure 845 systolic; will start with 10 mg Hydralazine. Will admit for stroke workup.   Discussed case with [facility] Dr. Tamala Julian who agrees to accept patient for admission.   Dr. Curly Shores with Neurology team consulted; recommends MRI Brain and MRA Head/Neck to age stroke. If stroke is truly acute/subacute will follow along at that time for stroke work up.   This note was prepared using Dragon voice recognition software and may include unintentional dictation errors due to the inherent limitations of voice recognition software.  Final Clinical Impression(s) / ED Diagnoses Final diagnoses:  Cerebrovascular accident (CVA), unspecified mechanism [facility])    Rx / [facility] Orders ED Discharge Orders    None       Eustaquio Maize, PA-C 10/26/20 1148    Little, Wenda Overland, MD 10/29/20 [phone]

## 2020-10-26 NOTE — Telephone Encounter (Signed)
Called patient back informed her after discussing with DOD staff that she should be evaluated in the emergency department. She is going in now. No further questions.

## 2020-10-26 NOTE — Telephone Encounter (Signed)
Called and spoke to patient. She reports she has been having high blood pressure for 1 week. Today's readings are 180/100 and 175/103 and heart rate 79. She does report blurred vision and dizziness for 1 week. She is not on blood pressure medication. She is outside [place] office wants to know if the office there can check her blood pressure. Advised I will check with DOD staff in [place] and let her know.

## 2020-10-26 NOTE — H&P (Signed)
HISTORY AND PHYSICAL       PATIENT DETAILS Name: [name] Age: 75 y.o. Sex: female Date of Birth: April 27, 1946 Admit Date: 10/26/2020 ZWC:HENIDP, Marvis Repress, FNP   Patient coming from: Home   CHIEF COMPLAINT:  High blood pressure readings for the past 2-3 days Intermittent dizziness for the past several weeks  HPI: [name] is a 75 y.o. female with medical history significant of bilateral breast cancer (s/p bilateral mastectomy), peripheral edema on furosemide, GERD-history of Nissen's fundoplication, anxiety/depression, osteoarthritis, fibromyalgia who presented to [facility] as a transfer from [place] for evaluation of the above noted complaints.  Per patient-for the past several days she is just not been feeling right-she has been complaining of fatigue and weakness.  She had her neighbor check her blood pressure-and the readings were in the 824M systolic range.  She went to a Education administrator and confirmed at this reading.  She then reached out to her cardiologist who advised her to seek medical attention as she was also experiencing these intermittent dizzy spells that has been ongoing for the past several weeks.  Per patient she describes these dizzy spells as very brief-lasting around 5 minutes-occasionally associated with diaphoresis and they resolve spontaneously.  There is no history of fever, nausea, vomiting or diarrhea.  Per patient-she has a longstanding history of esophageal problems-and she has had intermittent bilateral chest pain that has been attributed to her longstanding esophageal issues.  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  Case was discussed with neurology who recommended MRI/MRA brain.  She was then transferred to [facility] for further work-up.`  Note: Lives at: Home Mobility: Independent Chronic Indwelling  Foley:no   REVIEW OF SYSTEMS:  Constitutional:   No  weight loss, night sweats,  Fevers, chills, fatigue.  HEENT:    No headaches, Dysphagia,Tooth/dental problems,Sore throat,  No sneezing, itching, ear ache, nasal congestion, post nasal drip  Cardio-vascular: No chest pain,Orthopnea, PND,lower extremity edema, anasarca, palpitations  GI:  Noabdominal pain, nausea, vomiting, diarrhea, melena or hematochezia  Resp: No shortness of breath, cough, hemoptysis,plueritic chest pain.   Skin:  No rash or lesions.  GU:  No dysuria, change in color of urine, no urgency or frequency.  No flank pain.  Musculoskeletal: No joint pain or swelling.  No decreased range of motion.  No back pain.  Endocrine: No heat intolerance, no cold intolerance, no polyuria, no polydipsia  Psych: No change in mood or affect. No depression or anxiety.  No memory loss.   ALLERGIES:   Allergies  Allergen Reactions  . Other Anaphylaxis and Swelling    Glue (eye lash glue, gorilla glue)  . Silodosin Rash    Facial rash  . Ace Inhibitors Other (See Comments)    REACTION: angioedema  . Buprenorphine Hcl Other (See Comments)    "crazy"  . Morphine And Related Other (See Comments)    "crazy" "crazy"  . Codeine Rash  . Sulfonamide Derivatives Rash  . Telmisartan Rash    PAST MEDICAL HISTORY: Past Medical History:  Diagnosis Date  . Arthritis    "spine" (07/28/2014)  . Blood dyscrasia    bruises and bleed easily  . Breast cancer, left breast ([place]) 07/28/2014  . Breast cancer, right breast ([place]) 1995  . Chronic back pain    "all over"  . Complication  of anesthesia    went to sleep easily but hard to wake up up until elbow OR in 2010  . Depression   . Dyspnea    Normal Spirometry 03/2008 EF 65% BNP normal 11/2007  . Family history of breast cancer   . Family history of colon cancer   . Family history of pancreatic cancer   . Gastric polyps   . GERD (gastroesophageal reflux disease)   .  History of chicken pox   . History of hiatal hernia   . Hx of adenomatous polyp of colon 07/03/2015  . Kidney stone    right kidney   . Lichen sclerosus   . Melanoma ([place]) 2010   "right elbow; [facility] at [facility]"  . Mild anxiety   . Vitamin B12 deficiency   . Vitamin D deficiency     PAST SURGICAL HISTORY: Past Surgical History:  Procedure Laterality Date  . BREAST BIOPSY Left 04/2014  . BREAST RECONSTRUCTION WITH PLACEMENT OF TISSUE EXPANDER AND FLEX HD (ACELLULAR HYDRATED DERMIS) Left 07/28/2014   Procedure: LEFT BREAST RECONSTRUCTION PLACEMENT OF LEFT TISSUE EXPANDER ;  Surgeon: Crissie Reese, MD;  Location: [place];  Service: Plastics;  Laterality: Left;  . BREAST RECONSTRUCTION WITH PLACEMENT OF TISSUE EXPANDER AND FLEX HD (ACELLULAR HYDRATED DERMIS) Left 09/08/2014   Procedure: REMOVAL OF TISSUE EXPANDER FROM LEFT BREAST;  Surgeon: Crissie Reese, MD;  Location: [place];  Service: Plastics;  Laterality: Left;  . BUNIONECTOMY Bilateral 1970's  . COLONOSCOPY     Dr Sharlett Iles  . CYSTOSCOPY WITH RETROGRADE PYELOGRAM, URETEROSCOPY AND STENT PLACEMENT Right 06/09/2015   Procedure: CYSTOSCOPY WITH RIGHT RETROGRADE PYELOGRAM, RIGHT URETEROSCOPY AND RIGHT URTERAL STENT PLACEMENT;  Surgeon: Ardis Hughs, MD;  Location: [facility];  Service: Urology;  Laterality: Right;  . ESOPHAGOGASTRODUODENOSCOPY    . HERNIA REPAIR    . HOLMIUM LASER APPLICATION Right 8/92/1194   Procedure: HOLMIUM LASER APPLICATION;  Surgeon: Ardis Hughs, MD;  Location: [facility];  Service: Urology;  Laterality: Right;  . LATISSIMUS FLAP TO BREAST Left 09/08/2014   Procedure: LEFT LATISSIMUS FLAP TO BREAST WITH SALINE IMPLANT FOR BREAST RECONSTRUCTION;  Surgeon: Crissie Reese, MD;  Location: [place];  Service: Plastics;  Laterality: Left;  . LUMBAR FUSION  01/2018   L5-S1 transitional segmental laminectomy and Fusion  . MASTECTOMY Right 1996    chemotherapy. pt. states 13 lymph nodes were removed  . MASTECTOMY COMPLETE / SIMPLE  W/ SENTINEL NODE BIOPSY Left 07/28/2014  . MASTECTOMY W/ SENTINEL NODE BIOPSY Left 07/28/2014   Procedure: LEFT MASTECTOMY WITH SENTINEL LYMPH NODE MAPPING;  Surgeon: Autumn Messing III, MD;  Location: [place];  Service: General;  Laterality: Left;  Marland Kitchen MELANOMA EXCISION Right 2010   From elbow-- Done at [facility]   . NISSEN FUNDOPLICATION  17/4081  . RECONSTRUCTION BREAST IMMEDIATE / DELAYED W/ TISSUE EXPANDER Left 07/28/2014  . TEMPOROMANDIBULAR JOINT SURGERY Bilateral 1987  . TONSILLECTOMY      MEDICATIONS AT HOME: Prior to Admission medications   Medication Sig Start Date End Date Taking? Authorizing Provider  Acetaminophen (TYLENOL EXTRA STRENGTH PO) Take 1,000 mg by mouth every 8 (eight) hours as needed. 09/28/19   [provider]  Cholecalciferol (VITAMIN D3 PO) Take 6,000 Units by mouth daily.     [provider]  citalopram (CELEXA) 20 MG tablet Take 1 tablet (20 mg total) by mouth daily. 01/07/20   Brunetta Jeans, PA-C  cyanocobalamin 2000 MCG tablet Take 1.5 tablets (3,000 mcg total) by mouth  daily. 01/28/20   Brunetta Jeans, PA-C  fluticasone [facility]) 50 MCG/ACT nasal spray Place 2 sprays into both nostrils daily. 09/21/20   Marrian Salvage, FNP  furosemide (LASIX) 40 MG tablet Take 1 tablet (40 mg total) by mouth daily. 04/18/20   Brunetta Jeans, PA-C  naproxen sodium (ALEVE) 220 MG tablet Take 220 mg by mouth as needed.    [provider]  pantoprazole (PROTONIX) 40 MG tablet Take 1 tablet (40 mg total) by mouth 2 (two) times daily. 10/03/20   Esterwood, Amy S, PA-C    FAMILY HISTORY: Family History  Problem Relation Age of Onset  . Colon cancer Cousin 59       double first cousin  . Colon cancer Cousin 37       double first cousin  . Colon polyps Father        between 53-20  . Dementia Father   . CAD Father        CABG at age 41  . Stroke Mother   . Pancreatic cancer Brother 66  . Colon polyps Brother        between 10-20  . Cancer Paternal  Uncle        NOS  . Anemia Paternal Grandfather        pernicious anemia  . Breast cancer Cousin 92       double first cousin  . Breast cancer Cousin 74       paternal cousin  . Stroke Other        F 1st degree relative 54, M 1st degree relative  . Esophageal cancer Neg Hx   . Stomach cancer Neg Hx     SOCIAL HISTORY:  reports that she has never smoked. She has never used smokeless tobacco. She reports that she does not drink alcohol and does not use drugs.  PHYSICAL EXAM: Blood pressure (!) 165/84, pulse 79, temperature 98.5 F (36.9 C), temperature source Oral, resp. rate 17, height 5\' 5"  (1.651 m), weight 82.6 kg, SpO2 98 %.  General appearance :Awake, alert, not in any distress.  Eyes:, pupils equally reactive to light and accomodation,no scleral icterus.Pink conjunctiva HEENT: Atraumatic and Normocephalic Neck: supple, no JVD.  Resp:Good air entry bilaterally, no added sounds  CVS: S1 S2 regular, no murmurs.  GI: Bowel sounds present, Non tender and not distended with no gaurding, rigidity or rebound. Extremities: B/L Lower Ext shows no edema, both legs are warm to touch Neurology:  speech clear,Non focal, sensation is grossly intact. Psychiatric: Normal judgment and insight. Alert and oriented x 3. Normal mood. Musculoskeletal:gait appears to be normal.No digital cyanosis Skin:No Rash, warm and dry Wounds:N/A  LABS ON ADMISSION:  I have personally reviewed following labs and imaging studies  CBC: Recent Labs  Lab 10/26/20 1014  WBC 5.7  NEUTROABS 3.2  HGB 14.6  HCT 43.9  MCV 91.3  PLT 676    Basic Metabolic Panel: Recent Labs  Lab 10/26/20 1014  NA 141  K 3.6  CL 103  CO2 27  GLUCOSE 96  BUN 13  CREATININE 0.82  CALCIUM 8.9    GFR: Estimated Creatinine Clearance: 63.9 mL/min (by C-G formula based on SCr of 0.82 mg/dL).  Liver Function Tests: Recent Labs  Lab 10/26/20 1014  AST 21  ALT 14  ALKPHOS 61  BILITOT 0.5  PROT 7.2  ALBUMIN 4.3    No results for input(s): LIPASE, AMYLASE in the last 168 hours. No results for input(s): AMMONIA in  the last 168 hours.  Coagulation Profile: Recent Labs  Lab 10/26/20 1014  INR 1.0    Cardiac Enzymes: No results for input(s): CKTOTAL, CKMB, CKMBINDEX, TROPONINI in the last 168 hours.  BNP (last 3 results) No results for input(s): PROBNP in the last 8760 hours.  HbA1C: No results for input(s): HGBA1C in the last 72 hours.  CBG: Recent Labs  Lab 10/26/20 0956  GLUCAP 102*    Lipid Profile: No results for input(s): CHOL, HDL, LDLCALC, TRIG, CHOLHDL, LDLDIRECT in the last 72 hours.  Thyroid Function Tests: No results for input(s): TSH, T4TOTAL, FREET4, T3FREE, THYROIDAB in the last 72 hours.  Anemia Panel: No results for input(s): VITAMINB12, FOLATE, FERRITIN, TIBC, IRON, RETICCTPCT in the last 72 hours.  Urine analysis:    Component Value Date/Time   COLORURINE YELLOW 10/26/2020 1135   APPEARANCEUR CLEAR 10/26/2020 1135   LABSPEC <1.005 (L) 10/26/2020 1135   PHURINE 6.5 10/26/2020 1135   GLUCOSEU NEGATIVE 10/26/2020 1135   GLUCOSEU NEGATIVE 06/27/2016 1048   HGBUR NEGATIVE 10/26/2020 1135   BILIRUBINUR NEGATIVE 10/26/2020 1135   BILIRUBINUR neg 02/29/2016 1621   KETONESUR NEGATIVE 10/26/2020 1135   PROTEINUR NEGATIVE 10/26/2020 1135   UROBILINOGEN 0.2 06/27/2016 1048   NITRITE NEGATIVE 10/26/2020 1135   LEUKOCYTESUR NEGATIVE 10/26/2020 1135    Sepsis Labs: Lactic Acid, Venous    Component Value Date/Time   LATICACIDVEN 1.1 08/29/2014 0456     Microbiology: Recent Results (from the past 240 hour(s))  Resp Panel by RT-PCR (Flu A&B, Covid) Nasopharyngeal Swab     Status: None   Collection Time: 10/26/20 10:26 AM   Specimen: Nasopharyngeal Swab; Nasopharyngeal(NP) swabs in vial transport medium  Result Value Ref Range Status   SARS Coronavirus 2 by RT PCR NEGATIVE NEGATIVE Final    Comment: (NOTE) SARS-CoV-2 target nucleic acids are NOT  DETECTED.  The SARS-CoV-2 RNA is generally detectable in upper respiratory specimens during the acute phase of infection. The lowest concentration of SARS-CoV-2 viral copies this assay can detect is 138 copies/mL. A negative result does not preclude SARS-Cov-2 infection and should not be used as the sole basis for treatment or other patient management decisions. A negative result may occur with  improper specimen collection/handling, submission of specimen other than nasopharyngeal swab, presence of viral mutation(s) within the areas targeted by this assay, and inadequate number of viral copies(<138 copies/mL). A negative result must be combined with clinical observations, patient history, and epidemiological information. The expected result is Negative.  Fact Sheet for Patients:  EntrepreneurPulse.com.au  Fact Sheet for Healthcare Providers:  IncredibleEmployment.be  This test is no t yet approved or cleared by the [place] FDA and  has been authorized for detection and/or diagnosis of SARS-CoV-2 by FDA under an Emergency Use Authorization (EUA). This EUA will remain  in effect (meaning this test can be used) for the duration of the COVID-19 declaration under Section 564(b)(1) of the Act, 21 U.S.C.section 360bbb-3(b)(1), unless the authorization is terminated  or revoked sooner.       Influenza A by PCR NEGATIVE NEGATIVE Final   Influenza B by PCR NEGATIVE NEGATIVE Final    Comment: (NOTE) The Xpert Xpress SARS-CoV-2/FLU/RSV plus assay is intended as an aid in the diagnosis of influenza from Nasopharyngeal swab specimens and should not be used as a sole basis for treatment. Nasal washings and aspirates are unacceptable for Xpert Xpress SARS-CoV-2/FLU/RSV testing.  Fact Sheet for Patients: EntrepreneurPulse.com.au  Fact Sheet for Healthcare Providers: IncredibleEmployment.be  This test is  not yet  approved or cleared by the [place] and has been authorized for detection and/or diagnosis of SARS-CoV-2 by FDA under an Emergency Use Authorization (EUA). This EUA will remain in effect (meaning this test can be used) for the duration of the COVID-19 declaration under Section 564(b)(1) of the Act, 21 U.S.C. section 360bbb-3(b)(1), unless the authorization is terminated or revoked.  Performed at [facility], [address] [place], [place] [address]       RADIOLOGIC STUDIES ON ADMISSION: CT HEAD WO CONTRAST  Result Date: 10/26/2020 CLINICAL DATA:  Dizziness.  Hypertension EXAM: CT HEAD WITHOUT CONTRAST TECHNIQUE: Contiguous axial images were obtained from the base of the skull through the vertex without intravenous contrast. COMPARISON:  April 05, 2013 FINDINGS: Brain: Ventricles and sulci are normal in size and configuration. There is no appreciable intracranial mass, hemorrhage, extra-axial fluid collection, or midline shift. There is decreased attenuation in the head of the caudate nucleus on the right, a finding not present previously. There are patchy areas of decreased attenuation in the centra semiovale bilaterally. Mild decreased attenuation is noted in the anterior limb of each internal capsule. Vascular: No hyperdense vessel. There is calcification in each carotid siphon region. Skull: Bony calvarium appears intact. Sinuses/Orbits: There is mucosal thickening in several ethmoid air cells. Other visualized paranasal sinuses are clear visualized orbits appear symmetric bilaterally. Other: Visualized mastoid air cells are clear. IMPRESSION: 1. 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 2. Patchy periventricular small vessel disease in the centra semiovale bilaterally. Small vessel disease in each internal capsule noted. 3.  No evident mass or hemorrhage. 4.  There are foci of arterial vascular calcification. 5.   Mucosal thickening noted in several ethmoid air cells. Electronically Signed   By: Lowella Grip III M.D.   On: 10/26/2020 10:40   DG Chest Port 1 View  Result Date: 10/26/2020 CLINICAL DATA:  Chest pain and hypertension EXAM: PORTABLE CHEST 1 VIEW COMPARISON:  Chest CT Oct 02, 2020. FINDINGS: No edema or airspace opacity. Heart is upper normal in size with pulmonary vascularity normal. No adenopathy. Surgical clips noted over left breast region. No pneumothorax. IMPRESSION: No edema or airspace opacity.  Heart upper normal in size. Electronically Signed   By: Lowella Grip III M.D.   On: 10/26/2020 10:41    I have personally reviewed images of chest xray:PNA  EKG:  Personally reviewed.  NSR  ASSESSMENT AND PLAN: Possible recent/subacute CVA: No obvious focal neurological deficits-CT head reviewed-neurology input reviewed-we will obtain a MRI/MRA brain, MRA neck-as recommended by neurology.  Per neurology if no CVA-no further evaluation with neurology can be done in the outpatient setting, however if brain MRI positive for CVA-we will need a formal neurology evaluation.  Hypertensive urgency: Per patient-she does not have a diagnosis of hypertension-she is on furosemide for peripheral lower extremity edema.  For now allow permissive hypertension until stroke is either confirmed or ruled out on MRI brain.  Depending on MRI brain-we can contemplate starting antihypertensives.  Intermittent dizziness/lightheadedness: Could be due to undiagnosed hypertension-monitor on telemetry.  Await neuroimaging to ensure no structural lesions (history of breast cancer) or CVA.  Check echo.  History of GERD-s/p Nissen's fundoplication in 0355: Has been having GERD symptoms more frequently-continue PPI.  Follows with Dr. Carlean Purl.  History of bilateral breast cancer: Resume outpatient follow-up with oncology  History of osteoarthritis/fibromyalgia syndrome: Follows with Dr. Aura Fey in the  outpatient setting   Further plan will depend as patient's clinical course evolves and further radiologic and laboratory data become available. Patient will be monitored closely.  Above noted plan was discussed with patient face to face at bedside, she was in agreement.   CONSULTS: None  DVT Prophylaxis: Prophylactic Lovenox   Code Status: Full Code   Disposition Plan:  Discharge back home  possibly in 1-2 days, depending on clinical course  Admission status:  Inpatient going to tele  The medical decision making on this patient was of high complexity and the patient is at high risk for clinical deterioration, therefore this is a level 3 visit.    Total time spent  55 minutes.Greater than 50% of this time was spent in counseling, explanation of diagnosis, planning of further management, and coordination of care.  Severity of illness:  The appropriate patient status for this patient is INPATIENT. Inpatient status is judged to be reasonable and necessary in order to provide the required intensity of service to ensure the patient's safety. The patient's presenting symptoms, physical exam findings, and initial radiographic and laboratory data in the context of their chronic comorbidities is felt to place them at high risk for further clinical deterioration. Furthermore, it is not anticipated that the patient will be medically stable for discharge from the hospital within 2 midnights of admission. The following factors support the patient status of inpatient.   " The patient's presenting symptoms include dizziness/weakness " The worrisome physical exam findings include elevated blood pressure " The initial radiographic and laboratory data are worrisome because of possible CVA and CT head " The chronic co-morbidities include breast cancer   * I certify that at the point of admission it is my clinical judgment that the patient will require inpatient hospital care spanning beyond 2  midnights from the point of admission due to high intensity of service, high risk for further deterioration and high frequency of surveillance required.**  Jeslie Lowe [facility] Pager [phone]  If 7PM-7AM, please contact night-coverage  Please page via [email or web address]  Go to [email or web address] and use 's universal password to access. If you do not have the password, please contact the hospital operator.  Locate the [facility] provider you are looking for under [facility] and page to a number that you can be directly reached. If you still have difficulty reaching the provider, please page the [facility] (Director on Call) for the Hospitalists listed on amion for assistance.  10/26/2020, 6:06 PM

## 2020-10-26 NOTE — Plan of Care (Signed)
Patient briefly discussed with ED provider.  Concern for hypertensive emergency/urgency with head CT showing an age-indeterminate hypodensity.  Recommended MRI brain without contrast and MRA head without contrast, MRA neck with and without contrast, with neurology consultation and stroke work-up if MRI is positive for an acute/subacute process, otherwise outpatient follow-up for chronic findings is appropriate  Also recommended permissive hypertension pending confirmation of no critical vessel stenoses  Lesleigh Noe MD-PhD [facility] [phone] Available 7 AM to 7 PM, outside these hours please contact Neurologist on call listed on AMION

## 2020-10-26 NOTE — Telephone Encounter (Signed)
Pt c/o BP issue: STAT if pt c/o blurred vision, one-sided weakness or slurred speech  1. What are your last 5 BP readings? This morning 180/100, 175/103  2. Are you having any other symptoms (ex. Dizziness, headache, blurred vision, passed out)? Tired   3. What is your BP issue? Patient running high

## 2020-10-26 NOTE — ED Notes (Signed)
ED Provider at bedside. 

## 2020-10-26 NOTE — ED Triage Notes (Signed)
Pt arrives with c/o high BP and dizziness for about a week, BP at home around 180/100. States that she was 175/103 at [facility] on their BP cuff. Pt. Is on furosemide but has not taken it for the last few days.

## 2020-10-27 ENCOUNTER — Observation Stay (HOSPITAL_BASED_OUTPATIENT_CLINIC_OR_DEPARTMENT_OTHER): Payer: Medicare HMO

## 2020-10-27 DIAGNOSIS — R42 Dizziness and giddiness: Secondary | ICD-10-CM | POA: Diagnosis not present

## 2020-10-27 DIAGNOSIS — K219 Gastro-esophageal reflux disease without esophagitis: Secondary | ICD-10-CM | POA: Diagnosis not present

## 2020-10-27 DIAGNOSIS — I6389 Other cerebral infarction: Secondary | ICD-10-CM

## 2020-10-27 DIAGNOSIS — G6289 Other specified polyneuropathies: Secondary | ICD-10-CM

## 2020-10-27 DIAGNOSIS — I639 Cerebral infarction, unspecified: Secondary | ICD-10-CM | POA: Diagnosis not present

## 2020-10-27 DIAGNOSIS — I10 Essential (primary) hypertension: Secondary | ICD-10-CM | POA: Diagnosis not present

## 2020-10-27 HISTORY — DX: Dizziness and giddiness: R42

## 2020-10-27 LAB — LIPID PANEL
Cholesterol: 219 mg/dL — ABNORMAL HIGH (ref 0–200)
HDL: 48 mg/dL (ref >40)
LDL Cholesterol: 155 mg/dL — ABNORMAL HIGH (ref 0–99)
Total CHOL/HDL Ratio: 4.6 RATIO
Triglycerides: 78 mg/dL (ref <150)
VLDL: 16 mg/dL (ref 0–40)

## 2020-10-27 LAB — ECHOCARDIOGRAM COMPLETE
Area-P 1/2: 2.54 cm2
Height: 65 in
S' Lateral: 2.7 cm
Weight: 2912 oz

## 2020-10-27 LAB — TSH: TSH: 2.07 u[IU]/mL (ref 0.350–4.500)

## 2020-10-27 LAB — HEMOGLOBIN A1C
Hgb A1c MFr Bld: 5.3 % (ref 4.8–5.6)
Mean Plasma Glucose: 105 mg/dL

## 2020-10-27 MED ORDER — AMLODIPINE BESYLATE 5 MG PO TABS: 5.0000 mg | ORAL_TABLET | Freq: Every day | ORAL | 11 refills | Status: DC

## 2020-10-27 MED ORDER — ASPIRIN EC 81 MG PO TBEC: 81.0000 mg | DELAYED_RELEASE_TABLET | Freq: Every day | ORAL | 2 refills | Status: AC

## 2020-10-27 NOTE — Evaluation (Signed)
Physical Therapy Evaluation Patient Details Name: [name] MRN: [id number] DOB: 1946-04-04 Today's Date: 10/27/2020   History of Present Illness  [name] is a 75 y.o. female with medical history significant of bilateral breast cancer (s/p bilateral mastectomy), peripheral edema on furosemide, GERD-history of Nissen's fundoplication, anxiety/depression, osteoarthritis, fibromyalgia who presented to [facility] as a transfer from [place] for evaluation of high blood pressure and dizziness.  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  Clinical Impression  Patient received in bed. She is agreeable to PT session. Patient reports she is feeling fine. Patient is independent with bed mobility, transfers and ambulated 300 feet and up/down steps with supervision. Patient without LOB or reported difficulty. Mild fatigue after activity. Patient does not require PT follow up at this time and is safe to mobilize independently.     Follow Up Recommendations No PT follow up    Equipment Recommendations  None recommended by PT    Recommendations for Other Services       Precautions / Restrictions Precautions Precaution Comments: low fall Restrictions Weight Bearing Restrictions: No      Mobility  Bed Mobility Overal bed mobility: Independent                  Transfers Overall transfer level: Independent Equipment used: None                Ambulation/Gait Ambulation/Gait assistance: Independent Gait Distance (Feet): 300 Feet Assistive device: None Gait Pattern/deviations: Step-through pattern;WFL(Within Functional Limits) Gait velocity: WNL   General Gait Details: able to perform head turns while ambulating without lob or dizziness.  Stairs Stairs: Yes Stairs assistance: Independent Stair Management: One rail Right Number of Stairs:  3 General stair comments: generally safe on steps, usually holds rail on steps if available per her report  Wheelchair Mobility    Modified Rankin (Stroke Patients Only) Modified Rankin (Stroke Patients Only) Pre-Morbid Rankin Score: No symptoms Modified Rankin: No symptoms     Balance Overall balance assessment: Independent                                           Pertinent Vitals/Pain Pain Assessment: No/denies pain    Home Living Family/patient expects to be discharged to:: Private residence Living Arrangements: Spouse/significant other;Children Available Help at Discharge: Family;Available 24 hours/day Type of Home: House Home Access: Stairs to enter   [employer] of Steps: 3-4 Home Layout: One level Home Equipment: None      Prior Function Level of Independence: Independent         Comments: patient reports she is very independent at baseline     Hand Dominance        Extremity/Trunk Assessment   Upper Extremity Assessment Upper Extremity Assessment: Defer to OT evaluation    Lower Extremity Assessment Lower Extremity Assessment: Overall WFL for tasks assessed    Cervical / Trunk Assessment Cervical / Trunk Assessment: Normal  Communication   Communication: No difficulties  Cognition Arousal/Alertness: Awake/alert Behavior During Therapy: WFL for tasks assessed/performed Overall Cognitive Status: Within Functional Limits for tasks assessed  General Comments      Exercises     Assessment/Plan    PT Assessment Patent does not need any further PT services  PT Problem List         PT Treatment Interventions      PT Goals (Current goals can be found in the Care Plan section)  Acute Rehab PT Goals Patient Stated Goal: to return home PT Goal Formulation: With patient Time For Goal Achievement: 10/27/20 Potential to Achieve Goals: Good    Frequency      Barriers to discharge        Co-evaluation               AM-PAC PT "6 Clicks" Mobility  Outcome Measure Help needed turning from your back to your side while in a flat bed without using bedrails?: None Help needed moving from lying on your back to sitting on the side of a flat bed without using bedrails?: None Help needed moving to and from a bed to a chair (including a wheelchair)?: None Help needed standing up from a chair using your arms (e.g., wheelchair or bedside chair)?: None Help needed to walk in hospital room?: None Help needed climbing 3-5 steps with a railing? : None 6 Click Score: 24    End of Session   Activity Tolerance: Patient tolerated treatment well Patient left: in bed Nurse Communication: Mobility status      Time: 6144-3154 PT Time Calculation (min) (ACUTE ONLY): 15 min   Charges:   PT Evaluation $PT Eval Low Complexity: 1 Low          Jordyne Poehlman, PT, GCS 10/27/20,9:47 AM

## 2020-10-27 NOTE — Plan of Care (Signed)

## 2020-10-27 NOTE — Plan of Care (Signed)
  Problem: Education: Goal: Knowledge of General Education information will improve Description: Including pain rating scale, medication(s)/side effects and non-pharmacologic comfort measures 10/27/2020 1545 by Vesta Mixer, RN Outcome: Adequate for Discharge 10/27/2020 1410 by Vesta Mixer, RN Outcome: Progressing   Problem: Health Behavior/Discharge Planning: Goal: Ability to manage health-related needs will improve 10/27/2020 1545 by Vesta Mixer, RN Outcome: Adequate for Discharge 10/27/2020 1410 by Vesta Mixer, RN Outcome: Progressing   Problem: Clinical Measurements: Goal: Ability to maintain clinical measurements within normal limits will improve 10/27/2020 1545 by Vesta Mixer, RN Outcome: Adequate for Discharge 10/27/2020 1410 by Vesta Mixer, RN Outcome: Progressing Goal: Will remain free from infection 10/27/2020 1545 by Vesta Mixer, RN Outcome: Adequate for Discharge 10/27/2020 1410 by Vesta Mixer, RN Outcome: Progressing Goal: Diagnostic test results will improve 10/27/2020 1545 by Vesta Mixer, RN Outcome: Adequate for Discharge 10/27/2020 1410 by Vesta Mixer, RN Outcome: Progressing Goal: Respiratory complications will improve 10/27/2020 1545 by Vesta Mixer, RN Outcome: Adequate for Discharge 10/27/2020 1410 by Vesta Mixer, RN Outcome: Progressing Goal: Cardiovascular complication will be avoided 10/27/2020 1545 by Vesta Mixer, RN Outcome: Adequate for Discharge 10/27/2020 1410 by Vesta Mixer, RN Outcome: Progressing   Problem: Activity: Goal: Risk for activity intolerance will decrease 10/27/2020 1545 by Vesta Mixer, RN Outcome: Adequate for Discharge 10/27/2020 1410 by Vesta Mixer, RN Outcome: Progressing   Problem: Nutrition: Goal: Adequate nutrition will be maintained 10/27/2020 1545 by Vesta Mixer, RN Outcome: Adequate for Discharge 10/27/2020 1410 by Vesta Mixer, RN Outcome: Progressing    Problem: Coping: Goal: Level of anxiety will decrease 10/27/2020 1545 by Vesta Mixer, RN Outcome: Adequate for Discharge 10/27/2020 1410 by Vesta Mixer, RN Outcome: Progressing   Problem: Elimination: Goal: Will not experience complications related to bowel motility 10/27/2020 1545 by Vesta Mixer, RN Outcome: Adequate for Discharge 10/27/2020 1410 by Vesta Mixer, RN Outcome: Progressing Goal: Will not experience complications related to urinary retention 10/27/2020 1545 by Vesta Mixer, RN Outcome: Adequate for Discharge 10/27/2020 1410 by Vesta Mixer, RN Outcome: Progressing   Problem: Pain Managment: Goal: General experience of comfort will improve 10/27/2020 1545 by Vesta Mixer, RN Outcome: Adequate for Discharge 10/27/2020 1410 by Vesta Mixer, RN Outcome: Progressing   Problem: Safety: Goal: Ability to remain free from injury will improve 10/27/2020 1545 by Vesta Mixer, RN Outcome: Adequate for Discharge 10/27/2020 1410 by Vesta Mixer, RN Outcome: Progressing   Problem: Skin Integrity: Goal: Risk for impaired skin integrity will decrease 10/27/2020 1545 by Vesta Mixer, RN Outcome: Adequate for Discharge 10/27/2020 1410 by Vesta Mixer, RN Outcome: Progressing

## 2020-10-27 NOTE — Care Management CC44 (Signed)
Condition Code 44 Documentation Completed  Patient Details  Name: [name] MRN: [id number] Date of Birth: 23-Oct-1945   Condition Code 44 given:  Yes Patient signature on Condition Code 44 notice:  Yes Documentation of 2 MD's agreement:  Yes Code 44 added to claim:  Yes    Coralee Pesa, [place] 10/27/2020, 9:02 AM

## 2020-10-27 NOTE — Plan of Care (Signed)
  Previously:  Patient briefly discussed with ED provider.  Concern for hypertensive emergency/urgency with head CT showing an age-indeterminate hypodensity.  Recommended MRI brain without contrast and MRA head without contrast, MRA neck with and without contrast, with neurology consultation and stroke work-up if MRI is positive for an acute/subacute process, otherwise outpatient follow-up for chronic findings is appropriate  Also recommended permissive hypertension pending confirmation of no critical vessel stenoses   Update: MRI brain and MRA personally reviewed.  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  If there is a new neurological question or additional concern beyond age indeterminate hypodensity on initial head CT, please reach out to neurology   [place] [phone] Available 7 AM to 7 PM, outside these hours please contact Neurologist on call listed on AMION

## 2020-10-27 NOTE — Discharge Summary (Signed)
Physician Discharge Summary  [name] EPP:[id number] DOB: Jul 12, 1945 DOA: 10/26/2020  PCP: Marrian Salvage, FNP  Admit date: 10/26/2020 Discharge date: 10/27/2020  Admitted From: Home  Discharge disposition: Home  Recommendations for Outpatient Follow-Up:   . Follow up with your primary care provider in one week.  . Check CBC, BMP, magnesium in the next visit  Discharge Diagnosis:   Active Problems:   Dizziness   Discharge Condition: Improved.  Diet recommendation: Low sodium, heart healthy.    Wound care: None.  Code status: Full.   History of Present Illness:   [name] is a 75 y.o. female with medical history significant of bilateral breast cancer (s/p bilateral mastectomy), peripheral edema on furosemide, GERD-history of Nissen's fundoplication, anxiety/depression, osteoarthritis, fibromyalgia presented to hospital for fatigue, weakness and dizziness.  She was noted to have high blood pressure at [facility] where she checked it.    She then reached out to her cardiologist who advised her to seek medical attention as she was also experiencing these intermittent dizzy spells that has been ongoing for the past several weeks.  Per patient she describes these dizzy spells as very brief-lasting around 5 minutes-occasionally associated with diaphoresis and they resolve spontaneously.  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  Case was discussed with neurology who recommended MRI/MRA brain.  Patient was then admitted hospital for further evaluation.  Hospital Course:   Following conditions were addressed during hospitalization as listed below,  Dizziness, lightheadedness.  Initial suspicion was possible stroke on the CT scan which has been ruled out.  MRI brain did not show any acute infarct.  Blood pressure was elevated on admission.  This has improved at this time.  Patient was counseled about adequate  blood pressure control after discharge.  She did not have any focal neurological deficits.   Hypertensive urgency: No diagnosis at home.  Takes Lasix for lower extremity edema.  Per patient-she does not have a diagnosis of hypertension-she is on furosemide for peripheral lower extremity edema.  Patient was started on low-dose amlodipine on discharge.  This will need to be adjusted as outpatient.  History of GERD-s/p Nissen's fundoplication in 6063: Has been having GERD symptoms more frequently-continue PPI.    Patient follows with Dr. Carlean Purl, GI as outpatient..  History of bilateral breast cancer: Follow-up with oncology as outpatient.  History of osteoarthritis/fibromyalgia syndrome: Follows with Dr. Aura Fey in the outpatient setting  Disposition.  At this time, patient is stable for disposition home with outpatient PCP follow-up.  Medical Consultants:    Neurology  Procedures:    MRI of the brain, MRA of the head and neck Subjective:   Today, patient was seen and examined at bedside.  Ambulatory.  Denies any dizziness lightheadedness headache nausea or vomiting  Discharge Exam:   Vitals:   10/27/20 0855 10/27/20 1230  BP: (!) 143/77 (!) 154/92  Pulse: 79 69  Resp: 20 18  Temp: 98.2 F (36.8 C) 98.6 F (37 C)  SpO2: 98% 97%   Vitals:   10/26/20 2359 10/27/20 0337 10/27/20 0855 10/27/20 1230  BP: (!) 143/103 (!) 157/85 (!) 143/77 (!) 154/92  Pulse: 74 72 79 69  Resp: 17 16 20 18   Temp: (!) 97.5 F (36.4 C) 98.6 F (37 C) 98.2 F (36.8 C) 98.6 F (37 C)  TempSrc: Oral Oral Oral Oral  SpO2: 95% 95% 98% 97%  Weight:  Height:       General: Alert awake, not in obvious distress HENT: pupils equally reacting to light,  No scleral pallor or icterus noted. Oral mucosa is moist.  Chest:  Clear breath sounds.  Diminished breath sounds bilaterally. No crackles or wheezes.  CVS: S1 &S2 heard. No murmur.  Regular rate and rhythm. Abdomen: Soft,  nontender, nondistended.  Bowel sounds are heard.   Extremities: No cyanosis, clubbing or edema.  Peripheral pulses are palpable. Psych: Alert, awake and oriented, normal mood CNS:  No cranial nerve deficits.  Power equal in all extremities.   Skin: Warm and dry.  No rashes noted.  The results of significant diagnostics from this hospitalization (including imaging, microbiology, ancillary and laboratory) are listed below for reference.     Diagnostic Studies:   CT HEAD WO CONTRAST  Result Date: 10/26/2020 CLINICAL DATA:  Dizziness.  Hypertension EXAM: CT HEAD WITHOUT CONTRAST TECHNIQUE: Contiguous axial images were obtained from the base of the skull through the vertex without intravenous contrast. COMPARISON:  April 05, 2013 FINDINGS: Brain: Ventricles and sulci are normal in size and configuration. There is no appreciable intracranial mass, hemorrhage, extra-axial fluid collection, or midline shift. There is decreased attenuation in the head of the caudate nucleus on the right, a finding not present previously. There are patchy areas of decreased attenuation in the centra semiovale bilaterally. Mild decreased attenuation is noted in the anterior limb of each internal capsule. Vascular: No hyperdense vessel. There is calcification in each carotid siphon region. Skull: Bony calvarium appears intact. Sinuses/Orbits: There is mucosal thickening in several ethmoid air cells. Other visualized paranasal sinuses are clear visualized orbits appear symmetric bilaterally. Other: Visualized mastoid air cells are clear. IMPRESSION: 1. 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 2. Patchy periventricular small vessel disease in the centra semiovale bilaterally. Small vessel disease in each internal capsule noted. 3.  No evident mass or hemorrhage. 4.  There are foci of arterial vascular calcification. 5.  Mucosal thickening noted in several ethmoid air  cells. Electronically Signed   By: Lowella Grip III M.D.   On: 10/26/2020 10:40   MR ANGIO HEAD WO CONTRAST  Result Date: 10/26/2020 CLINICAL DATA:  Dizziness EXAM: MRI HEAD WITHOUT CONTRAST MRA HEAD WITHOUT CONTRAST MRA OF THE NECK WITHOUT AND WITH CONTRAST TECHNIQUE: Multiplanar, multi-echo pulse sequences of the brain and surrounding structures were acquired without intravenous contrast. Angiographic images of the Circle of Willis were acquired using MRA technique without intravenous contrast. Angiographic images of the neck were acquired using MRA technique without and with intravenous contrast. Carotid stenosis measurements (when applicable) are obtained utilizing NASCET criteria, using the distal internal carotid diameter as the denominator. CONTRAST:  39mL GADAVIST GADOBUTROL 1 MMOL/ML IV SOLN COMPARISON:  No pertinent prior exam. FINDINGS: MR HEAD FINDINGS Brain: No acute infarct, mass effect or extra-axial collection. No acute or chronic hemorrhage. There is multifocal hyperintense T2-weighted signal within the white matter. Generalized volume loss without a clear lobar predilection. The midline structures are normal. Vascular: Major flow voids are preserved. Skull and upper cervical spine: Normal calvarium and skull base. Visualized upper cervical spine and soft tissues are normal. Sinuses/Orbits:No paranasal sinus fluid levels or advanced mucosal thickening. No mastoid or middle ear effusion. Normal orbits. MRA HEAD FINDINGS POSTERIOR CIRCULATION: --Vertebral arteries: Normal --Inferior cerebellar arteries: Normal. --Basilar artery: Normal. --Superior cerebellar arteries: Normal. --Posterior cerebral arteries: Normal. ANTERIOR CIRCULATION: --Intracranial internal carotid arteries: Normal. --Anterior cerebral arteries (  ACA): Normal. --Middle cerebral arteries (MCA): Normal. ANATOMIC VARIANTS: Both posterior communicating arteries are patent. MRA NECK FINDINGS Aortic arch: Normal 3 vessel branching  pattern. Subclavian arteries are normal. Right carotid system: No stenosis or acute abnormality. Left carotid system: No stenosis or acute abnormality. Vertebral arteries: Codominant and normal Other: None. IMPRESSION: 1. No acute intracranial abnormality. 2. Normal MRA of the head and neck. 3. Mild chronic small vessel disease. Electronically Signed   By: Ulyses Jarred M.D.   On: 10/26/2020 21:03   MR ANGIO NECK W WO CONTRAST  Result Date: 10/26/2020 CLINICAL DATA:  Dizziness EXAM: MRI HEAD WITHOUT CONTRAST MRA HEAD WITHOUT CONTRAST MRA OF THE NECK WITHOUT AND WITH CONTRAST TECHNIQUE: Multiplanar, multi-echo pulse sequences of the brain and surrounding structures were acquired without intravenous contrast. Angiographic images of the Circle of Willis were acquired using MRA technique without intravenous contrast. Angiographic images of the neck were acquired using MRA technique without and with intravenous contrast. Carotid stenosis measurements (when applicable) are obtained utilizing NASCET criteria, using the distal internal carotid diameter as the denominator. CONTRAST:  2mL GADAVIST GADOBUTROL 1 MMOL/ML IV SOLN COMPARISON:  No pertinent prior exam. FINDINGS: MR HEAD FINDINGS Brain: No acute infarct, mass effect or extra-axial collection. No acute or chronic hemorrhage. There is multifocal hyperintense T2-weighted signal within the white matter. Generalized volume loss without a clear lobar predilection. The midline structures are normal. Vascular: Major flow voids are preserved. Skull and upper cervical spine: Normal calvarium and skull base. Visualized upper cervical spine and soft tissues are normal. Sinuses/Orbits:No paranasal sinus fluid levels or advanced mucosal thickening. No mastoid or middle ear effusion. Normal orbits. MRA HEAD FINDINGS POSTERIOR CIRCULATION: --Vertebral arteries: Normal --Inferior cerebellar arteries: Normal. --Basilar artery: Normal. --Superior cerebellar arteries: Normal.  --Posterior cerebral arteries: Normal. ANTERIOR CIRCULATION: --Intracranial internal carotid arteries: Normal. --Anterior cerebral arteries (ACA): Normal. --Middle cerebral arteries (MCA): Normal. ANATOMIC VARIANTS: Both posterior communicating arteries are patent. MRA NECK FINDINGS Aortic arch: Normal 3 vessel branching pattern. Subclavian arteries are normal. Right carotid system: No stenosis or acute abnormality. Left carotid system: No stenosis or acute abnormality. Vertebral arteries: Codominant and normal Other: None. IMPRESSION: 1. No acute intracranial abnormality. 2. Normal MRA of the head and neck. 3. Mild chronic small vessel disease. Electronically Signed   By: Ulyses Jarred M.D.   On: 10/26/2020 21:03   MR BRAIN WO CONTRAST  Result Date: 10/26/2020 CLINICAL DATA:  Dizziness EXAM: MRI HEAD WITHOUT CONTRAST MRA HEAD WITHOUT CONTRAST MRA OF THE NECK WITHOUT AND WITH CONTRAST TECHNIQUE: Multiplanar, multi-echo pulse sequences of the brain and surrounding structures were acquired without intravenous contrast. Angiographic images of the Circle of Willis were acquired using MRA technique without intravenous contrast. Angiographic images of the neck were acquired using MRA technique without and with intravenous contrast. Carotid stenosis measurements (when applicable) are obtained utilizing NASCET criteria, using the distal internal carotid diameter as the denominator. CONTRAST:  73mL GADAVIST GADOBUTROL 1 MMOL/ML IV SOLN COMPARISON:  No pertinent prior exam. FINDINGS: MR HEAD FINDINGS Brain: No acute infarct, mass effect or extra-axial collection. No acute or chronic hemorrhage. There is multifocal hyperintense T2-weighted signal within the white matter. Generalized volume loss without a clear lobar predilection. The midline structures are normal. Vascular: Major flow voids are preserved. Skull and upper cervical spine: Normal calvarium and skull base. Visualized upper cervical spine and soft tissues are  normal. Sinuses/Orbits:No paranasal sinus fluid levels or advanced mucosal thickening. No mastoid or middle ear effusion. Normal orbits. MRA HEAD  FINDINGS POSTERIOR CIRCULATION: --Vertebral arteries: Normal --Inferior cerebellar arteries: Normal. --Basilar artery: Normal. --Superior cerebellar arteries: Normal. --Posterior cerebral arteries: Normal. ANTERIOR CIRCULATION: --Intracranial internal carotid arteries: Normal. --Anterior cerebral arteries (ACA): Normal. --Middle cerebral arteries (MCA): Normal. ANATOMIC VARIANTS: Both posterior communicating arteries are patent. MRA NECK FINDINGS Aortic arch: Normal 3 vessel branching pattern. Subclavian arteries are normal. Right carotid system: No stenosis or acute abnormality. Left carotid system: No stenosis or acute abnormality. Vertebral arteries: Codominant and normal Other: None. IMPRESSION: 1. No acute intracranial abnormality. 2. Normal MRA of the head and neck. 3. Mild chronic small vessel disease. Electronically Signed   By: Ulyses Jarred M.D.   On: 10/26/2020 21:03   DG Chest Port 1 View  Result Date: 10/26/2020 CLINICAL DATA:  Chest pain and hypertension EXAM: PORTABLE CHEST 1 VIEW COMPARISON:  Chest CT Oct 02, 2020. FINDINGS: No edema or airspace opacity. Heart is upper normal in size with pulmonary vascularity normal. No adenopathy. Surgical clips noted over left breast region. No pneumothorax. IMPRESSION: No edema or airspace opacity.  Heart upper normal in size. Electronically Signed   By: Lowella Grip III M.D.   On: 10/26/2020 10:41   ECHOCARDIOGRAM COMPLETE  Result Date: 10/27/2020    ECHOCARDIOGRAM REPORT   Patient Name:   ZARIELLE CEA Date of Exam: 10/27/2020 Medical Rec #:  [id number]      Height:       65.0 in Accession #:    [id number]     Weight:       182.0 lb Date of Birth:  12-21-1945       BSA:          1.900 m Patient Age:    36 years       BP:           143/77 mmHg Patient Gender: F              HR:           73 bpm. Exam Location:   Inpatient Procedure: 2D Echo Indications:    stroke  History:        Patient has prior history of Echocardiogram examinations, most                 recent 11/25/2007. History of cancer; Risk Factors:Hypertension.  Sonographer:    Johny Chess Referring Phys: [place]  1. Left ventricular ejection fraction, by estimation, is 60 to 65%. The left ventricle has normal function. The left ventricle has no regional wall motion abnormalities. There is moderate asymmetric hypertrophy of the basal-septum. The rest of the LV segments demonstrate mild-to-moderate left ventricular hypertrophy. Left ventricular diastolic parameters are consistent with Grade I diastolic dysfunction (impaired relaxation).  2. Right ventricular systolic function is normal. The right ventricular size is normal. Tricuspid regurgitation signal is inadequate for assessing PA pressure.  3. The mitral valve is normal in structure. Trivial mitral valve regurgitation. No evidence of mitral stenosis.  4. The aortic valve is tricuspid. There is mild calcification of the aortic valve. There is mild thickening of the aortic valve. Aortic valve regurgitation is not visualized. Mild aortic valve sclerosis is present, with no evidence of aortic valve stenosis.  5. The inferior vena cava is normal in size with greater than 50% respiratory variability, suggesting right atrial pressure of 3 mmHg. Comparison(s): Compared to prior echo report in 2009, there is no significant change. Conclusion(s)/Recommendation(s): No intracardiac source of embolism detected on this transthoracic study. A  transesophageal echocardiogram is recommended to exclude cardiac source of embolism if clinically indicated. FINDINGS  Left Ventricle: Left ventricular ejection fraction, by estimation, is 60 to 65%. The left ventricle has normal function. The left ventricle has no regional wall motion abnormalities. The left ventricular internal cavity size was normal in  size. There is  moderate asymmetric hypertrophy of the basal-septum. The rest of the LV segments demonstrate mild-to-moderate left ventricular hypertrophy. Left ventricular diastolic parameters are consistent with Grade I diastolic dysfunction (impaired relaxation). Right Ventricle: The right ventricular size is normal. No increase in right ventricular wall thickness. Right ventricular systolic function is normal. Tricuspid regurgitation signal is inadequate for assessing PA pressure. Left Atrium: Left atrial size was normal in size. Right Atrium: Right atrial size was normal in size. Pericardium: There is no evidence of pericardial effusion. Mitral Valve: The mitral valve is normal in structure. There is mild thickening of the mitral valve leaflet(s). There is mild calcification of the mitral valve leaflet(s). Mild mitral annular calcification. Trivial mitral valve regurgitation. No evidence  of mitral valve stenosis. Tricuspid Valve: The tricuspid valve is normal in structure. Tricuspid valve regurgitation is trivial. Aortic Valve: The aortic valve is tricuspid. There is mild calcification of the aortic valve. There is mild thickening of the aortic valve. Aortic valve regurgitation is not visualized. Mild aortic valve sclerosis is present, with no evidence of aortic valve stenosis. Pulmonic Valve: The pulmonic valve was normal in structure. Pulmonic valve regurgitation is trivial. Aorta: The aortic root and ascending aorta are structurally normal, with no evidence of dilitation. Venous: The inferior vena cava is normal in size with greater than 50% respiratory variability, suggesting right atrial pressure of 3 mmHg. IAS/Shunts: No atrial level shunt detected by color flow Doppler.  LEFT VENTRICLE PLAX 2D LVIDd:         3.80 cm  Diastology LVIDs:         2.70 cm  LV e' medial:    6.31 cm/s LV PW:         1.10 cm  LV E/e' medial:  8.7 LV IVS:        1.00 cm  LV e' lateral:   8.16 cm/s LVOT diam:     1.60 cm  LV E/e'  lateral: 6.7 LV SV:         31 LV SV Index:   16 LVOT Area:     2.01 cm  RIGHT VENTRICLE            IVC RV S prime:     9.25 cm/s  IVC diam: 1.20 cm TAPSE (M-mode): 1.9 cm LEFT ATRIUM             Index       RIGHT ATRIUM           Index LA diam:        3.10 cm 1.63 cm/m  RA Area:     12.10 cm LA Vol (A2C):   37.5 ml 19.73 ml/m RA Volume:   25.90 ml  13.63 ml/m LA Vol (A4C):   36.5 ml 19.21 ml/m LA Biplane Vol: 38.1 ml 20.05 ml/m  AORTIC VALVE LVOT Vmax:   83.10 cm/s LVOT Vmean:  53.600 cm/s LVOT VTI:    0.154 m  AORTA Ao Root diam: 3.00 cm Ao Asc diam:  3.30 cm MITRAL VALVE MV Area (PHT): 2.54 cm     SHUNTS MV Decel Time: 299 msec     Systemic VTI:  0.15 m MV E  velocity: 54.80 cm/s   Systemic Diam: 1.60 cm MV A velocity: 103.00 cm/s MV E/A ratio:  0.53 Gwyndolyn Kaufman MD Electronically signed by Gwyndolyn Kaufman MD Signature Date/Time: 10/27/2020/12:30:17 PM    Final      Labs:   Basic Metabolic Panel: Recent Labs  Lab 10/26/20 1014 10/26/20 1916  NA 141  --   K 3.6  --   CL 103  --   CO2 27  --   GLUCOSE 96  --   BUN 13  --   CREATININE 0.82 0.78  CALCIUM 8.9  --    GFR Estimated Creatinine Clearance: 65.5 mL/min (by C-G formula based on SCr of 0.78 mg/dL). Liver Function Tests: Recent Labs  Lab 10/26/20 1014  AST 21  ALT 14  ALKPHOS 61  BILITOT 0.5  PROT 7.2  ALBUMIN 4.3   No results for input(s): LIPASE, AMYLASE in the last 168 hours. No results for input(s): AMMONIA in the last 168 hours. Coagulation profile Recent Labs  Lab 10/26/20 1014  INR 1.0    CBC: Recent Labs  Lab 10/26/20 1014 10/26/20 1916  WBC 5.7 7.9  NEUTROABS 3.2  --   HGB 14.6 15.1*  HCT 43.9 44.5  MCV 91.3 91.2  PLT 197 203   Cardiac Enzymes: No results for input(s): CKTOTAL, CKMB, CKMBINDEX, TROPONINI in the last 168 hours. BNP: Invalid input(s): POCBNP CBG: Recent Labs  Lab 10/26/20 0956  GLUCAP 102*   D-Dimer No results for input(s): DDIMER in the last 72 hours. Hgb  A1c No results for input(s): HGBA1C in the last 72 hours. Lipid Profile Recent Labs    10/27/20 0050  CHOL 219*  HDL 48  LDLCALC 155*  TRIG 78  CHOLHDL 4.6   Thyroid function studies Recent Labs    10/27/20 0050  TSH 2.070   Anemia work up No results for input(s): VITAMINB12, FOLATE, FERRITIN, TIBC, IRON, RETICCTPCT in the last 72 hours. Microbiology Recent Results (from the past 240 hour(s))  Resp Panel by RT-PCR (Flu A&B, Covid) Nasopharyngeal Swab     Status: None   Collection Time: 10/26/20 10:26 AM   Specimen: Nasopharyngeal Swab; Nasopharyngeal(NP) swabs in vial transport medium  Result Value Ref Range Status   SARS Coronavirus 2 by RT PCR NEGATIVE NEGATIVE Final    Comment: (NOTE) SARS-CoV-2 target nucleic acids are NOT DETECTED.  The SARS-CoV-2 RNA is generally detectable in upper respiratory specimens during the acute phase of infection. The lowest concentration of SARS-CoV-2 viral copies this assay can detect is 138 copies/mL. A negative result does not preclude SARS-Cov-2 infection and should not be used as the sole basis for treatment or other patient management decisions. A negative result may occur with  improper specimen collection/handling, submission of specimen other than nasopharyngeal swab, presence of viral mutation(s) within the areas targeted by this assay, and inadequate number of viral copies(<138 copies/mL). A negative result must be combined with clinical observations, patient history, and epidemiological information. The expected result is Negative.  Fact Sheet for Patients:  EntrepreneurPulse.com.au  Fact Sheet for Healthcare Providers:  IncredibleEmployment.be  This test is no t yet approved or cleared by the [place] FDA and  has been authorized for detection and/or diagnosis of SARS-CoV-2 by FDA under an Emergency Use Authorization (EUA). This EUA will remain  in effect (meaning this test can  be used) for the duration of the COVID-19 declaration under Section 564(b)(1) of the Act, 21 U.S.C.section 360bbb-3(b)(1), unless the authorization is terminated  or revoked  sooner.       Influenza A by PCR NEGATIVE NEGATIVE Final   Influenza B by PCR NEGATIVE NEGATIVE Final    Comment: (NOTE) The Xpert Xpress SARS-CoV-2/FLU/RSV plus assay is intended as an aid in the diagnosis of influenza from Nasopharyngeal swab specimens and should not be used as a sole basis for treatment. Nasal washings and aspirates are unacceptable for Xpert Xpress SARS-CoV-2/FLU/RSV testing.  Fact Sheet for Patients: EntrepreneurPulse.com.au  Fact Sheet for Healthcare Providers: IncredibleEmployment.be  This test is not yet approved or cleared by the [place] FDA and has been authorized for detection and/or diagnosis of SARS-CoV-2 by FDA under an Emergency Use Authorization (EUA). This EUA will remain in effect (meaning this test can be used) for the duration of the COVID-19 declaration under Section 564(b)(1) of the Act, 21 U.S.C. section 360bbb-3(b)(1), unless the authorization is terminated or revoked.  Performed at [facility], [address] [place], [place] [address]      Discharge Instructions:   Discharge Instructions    Diet - low sodium heart healthy   Complete by: As directed    Discharge instructions   Complete by: As directed    Follow up with your primary care physician in one week. Increase fluid intake. Take time to change positions especially from lying to sitting or standing. Seek medical attention for worsening symptoms.You have been prescribed Blood pressure medication, discuss with your doctor in the next visit about it.   Increase activity slowly   Complete by: As directed      Allergies as of 10/27/2020      Reactions   Other Anaphylaxis, Swelling   Glue (eye lash glue, gorilla glue)   Silodosin Rash   Facial rash    Ace Inhibitors Other (See Comments)   REACTION: angioedema   Buprenorphine Hcl Other (See Comments)   "crazy"   Morphine And Related Other (See Comments)   "crazy" "crazy"   Codeine Rash   Sulfonamide Derivatives Rash   Telmisartan Rash      Medication List    TAKE these medications   amLODipine 5 MG tablet Commonly known as: NORVASC Take 1 tablet (5 mg total) by mouth daily.   aspirin EC 81 MG tablet Take 1 tablet (81 mg total) by mouth daily. Swallow whole.   citalopram 20 MG tablet Commonly known as: CELEXA Take 1 tablet (20 mg total) by mouth daily.   cyanocobalamin 2000 MCG tablet Take 1.5 tablets (3,000 mcg total) by mouth daily.   fluticasone 50 MCG/ACT nasal spray Commonly known as: FLONASE Place 2 sprays into both nostrils daily. What changed:   when to take this  reasons to take this   furosemide 40 MG tablet Commonly known as: LASIX Take 1 tablet (40 mg total) by mouth daily.   montelukast 10 MG tablet Commonly known as: SINGULAIR Take 1 tablet by mouth daily.   naproxen sodium 220 MG tablet Commonly known as: ALEVE Take 220 mg by mouth as needed (headaches).   pantoprazole 40 MG tablet Commonly known as: PROTONIX Take 1 tablet (40 mg total) by mouth 2 (two) times daily.   VITAMIN D3 PO Take 1,000 Units by mouth daily.       Follow-up Information    Marrian Salvage, FNP. Schedule an appointment as soon as possible for a visit in 1 week(s).   Specialty: Internal Medicine Contact information: [address] [place] [address] [phone]  Time coordinating discharge: 39 minutes  Signed:  Jeyda Siebel  [facility] 10/27/2020, 1:04 PM

## 2020-10-27 NOTE — Evaluation (Signed)
Occupational Therapy Evaluation Patient Details Name: [name] MRN: [id number] DOB: 19-Apr-1946 Today's Date: 10/27/2020    History of Present Illness [name] is a 75 y.o. female with medical history significant of bilateral breast cancer (s/p bilateral mastectomy), peripheral edema on furosemide, GERD-history of Nissen's fundoplication, anxiety/depression, osteoarthritis, fibromyalgia who presented to [facility] as a transfer from [place] for evaluation of high blood pressure and dizziness.  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   Clinical Impression   Patient admitted for the diagnosis above.  PTA she lives with her spouse and older children that are transitioning between homes, who can provide any oversight or assist needed.  She is very independent, and requires no assist with ADL/IADL, or mobility.  Most of her symptoms have resolved, and she presents at her baseline for all functional tasks.  No further acute OT needs, and no post acute needs identified.      Follow Up Recommendations  No OT follow up    Equipment Recommendations  None recommended by OT (could consider a tubseat for the shower.)    Recommendations for Other Services       Precautions / Restrictions Precautions Precautions: None Precaution Comments: low fall Restrictions Weight Bearing Restrictions: No      Mobility Bed Mobility Overal bed mobility: Independent               Patient Response: Cooperative  Transfers Overall transfer level: Independent Equipment used: None                  Balance Overall balance assessment: No apparent balance deficits (not formally assessed)                                         ADL either performed or assessed with clinical judgement   ADL Overall ADL's : At baseline                                        General ADL Comments: No defiicts noted.     Vision Baseline Vision/History: Wears glasses Patient Visual Report: No change from baseline       Perception  WFL   Praxis  Intact    Pertinent Vitals/Pain Pain Assessment: No/denies pain     Hand Dominance Right   Extremity/Trunk Assessment Upper Extremity Assessment Upper Extremity Assessment: Overall WFL for tasks assessed;RUE deficits/detail RUE Deficits / Details: mild numbness and tingling, slight difference to MMT with R UE weaker than L.  Does not impact her functionally, but has dropped items. RUE Sensation: decreased light touch RUE Coordination: WNL   Lower Extremity Assessment Lower Extremity Assessment: Defer to PT evaluation   Cervical / Trunk Assessment Cervical / Trunk Assessment: Normal   Communication Communication Communication: No difficulties   Cognition Arousal/Alertness: Awake/alert Behavior During Therapy: WFL for tasks assessed/performed Overall Cognitive Status: Within Functional Limits for tasks assessed                                 General Comments: Patient admits to mild ST memory defict; however, she does express a lot going  on now in her life, nothing that presents itself as concrening.   General Comments       Exercises     Shoulder Instructions      Home Living Family/patient expects to be discharged to:: Private residence Living Arrangements: Spouse/significant other;Children Available Help at Discharge: Family;Available 24 hours/day Type of Home: House Home Access: Stairs to enter [employer] of Steps: 3-4   Home Layout: One level     Bathroom Shower/Tub: Occupational psychologist: Standard     Home Equipment: None          Prior Functioning/Environment Level of Independence: Independent        Comments: patient reports she is very independent at baseline        OT Problem List:  Decreased strength      OT Treatment/Interventions:      OT Goals(Current goals can be found in the care plan section) Acute Rehab OT Goals Patient Stated Goal: to return home OT Goal Formulation: With patient Time For Goal Achievement: 10/27/20 Potential to Achieve Goals: Good  OT Frequency:     Barriers to D/C:  None noted          Co-evaluation              AM-PAC OT "6 Clicks" Daily Activity     Outcome Measure Help from another person eating meals?: None Help from another person taking care of personal grooming?: None Help from another person toileting, which includes using toliet, bedpan, or urinal?: None Help from another person bathing (including washing, rinsing, drying)?: None Help from another person to put on and taking off regular upper body clothing?: None Help from another person to put on and taking off regular lower body clothing?: None 6 Click Score: 24   End of Session Nurse Communication: Mobility status  Activity Tolerance: Patient tolerated treatment well Patient left: in bed;with call bell/phone within reach  OT Visit Diagnosis: Hemiplegia and hemiparesis Hemiplegia - Right/Left: Right Hemiplegia - dominant/non-dominant: Dominant Hemiplegia - caused by: Unspecified                Time: 1141-1200 OT Time Calculation (min): 19 min Charges:  OT General Charges $OT Visit: 1 Visit OT Evaluation $OT Eval Moderate Complexity: 1 Mod  10/27/2020  Rich, OTR/L  Acute Rehabilitation Services  Office:  [place] 10/27/2020, 12:08 PM

## 2020-10-27 NOTE — Progress Notes (Signed)
  Echocardiogram 2D Echocardiogram has been performed.  [name] 10/27/2020, 11:10 AM

## 2020-10-31 ENCOUNTER — Telehealth: Payer: Self-pay

## 2020-10-31 NOTE — Telephone Encounter (Signed)
Transition Care Management Follow-up Telephone Call  Date of discharge and from where: 10/27/2020-[place]  How have you been since you were released from the hospital? Feeling ok but still very tired  Any questions or concerns? No  Items Reviewed:  Did the pt receive and understand the discharge instructions provided? Yes   Medications obtained and verified? Yes   Other? Yes   Any new allergies since your discharge? No   Dietary orders reviewed? Yes  Do you have support at home? Yes   Home Care and Equipment/Supplies: Were home health services ordered? no If so, what is the name of the agency? n/a  Has the agency set up a time to come to the patient's home? not applicable Were any new equipment or medical supplies ordered?  No What is the name of the medical supply agency? n/a Were you able to get the supplies/equipment? not applicable   Functional Questionnaire: (I = Independent and D = Dependent) ADLs: I  Bathing/Dressing- I  Meal Prep- I  Eating- I  Maintaining continence- I  Transferring/Ambulation- I  Managing Meds- I  Follow up appointments reviewed:   PCP Hospital f/u appt confirmed? Yes  Scheduled to see Jodi Mourning on 11/07/2020 @ 9:40.  [facility] f/u appt confirmed? n/a   Are transportation arrangements needed? No   If their condition worsens, is the pt aware to call PCP or go to the Emergency Dept.? Yes  Was the patient provided with contact information for the PCP's office or ED? Yes  Was to pt encouraged to call back with questions or concerns? Yes

## 2020-11-01 NOTE — Progress Notes (Signed)
Cardiology Office Note:    Date:  11/02/2020   ID:  [name], DOB [birth year], MRN [id number]  PCP:  Marrian Salvage, FNP  Cardiologist:  Shirlee More, MD   Referring MD: Marrian Salvage,*  ASSESSMENT:    1. Precordial pain   2. SOB (shortness of breath)   3. Essential hypertension   4. Pure hypercholesterolemia    PLAN:    In order of problems listed above:  She has a worsened pattern of chest pain best described as possible angina.  She is at increased cardiovascular risk with age history of breast cancer with chemotherapy hypertension and significant hyper lipidemia.  She has had previous normal myocardial perfusion study.  Her differential diagnosis also involves esophageal pain.  We will recheck an EKG today should be set up for an urgent cardiac CTA and if high risk markers would benefit from coronary angiography.  In the interim start her on a beta-blocker statin and a prescription for nitroglycerin for severe episodes. For pulmonary consultation tomorrow CT of the chest described a mosaic pattern of uncertain clinical significance she has a chronic cough also. Continue her calcium channel blocker start beta-blocker Initiate statin  Next appointment 4 weeks   Medication Adjustments/Labs and Tests Ordered: Current medicines are reviewed at length with the patient today.  Concerns regarding medicines are outlined above.  Orders Placed This Encounter  Procedures   CT CORONARY MORPH W/CTA COR W/SCORE W/CA W/CM &/OR WO/CM   Basic metabolic panel   EKG [id number]    Meds ordered this encounter  Medications   rosuvastatin (CRESTOR) 10 MG tablet    Sig: Take 1 tablet (10 mg total) by mouth daily.    Dispense:  90 tablet    Refill:  3   nitroGLYCERIN (NITROSTAT) 0.4 MG SL tablet    Sig: Place 1 tablet (0.4 mg total) under the tongue every 5 (five) minutes as needed.    Dispense:  30 tablet    Refill:  3   metoprolol succinate (TOPROL XL) 25 MG 24 hr tablet     Sig: Take 1 tablet (25 mg total) by mouth daily.    Dispense:  90 tablet    Refill:  3      Chief Complaint  Patient presents with   Chest Pain   Shortness of Breath    History of Present Illness:    [name] is a 75 y.o. female who is being seen today for the evaluation of chest pain at the request of Marrian Salvage,*.  Chart review shows a history of breast cancer bilateral mastectomy, her initial breast cancer was in at age 58 and she received chemotherapy for 6 months she did not have radiation therapy chronic edema taking a diuretic and longstanding GERD with Nissen fundoplication followed by gastroenterology with a history of being admitted on hospital 10/26/2020 weakness and dizziness CT in the emergency room showed recent or acute infarction of the head of the caudate nucleus on the right subsequent MRI did not confirm stroke.  With elevated blood pressure and a history of hypertension she was initiated on calcium channel blocker.  Her EKG 10/26/2020 showed sinus rhythm and was normal  Echocardiogram performed 10/27/2020 reviewed left ventricular systolic function is normal is normal EF 60 to 65% there is moderate left ventricular hypertrophy and normal left ventricular filling pressure.  Right ventricular size and function was normal and there was no significant valvular abnormality.  Chest x-ray showed upper normal heart  size and was normal.  CT angio of the chest 10/02/2020 showed no evidence of pulmonary embolism however it showed small pulmonary nodules and mosaic attenuation small airway disease air trapping.  She was seen by my partner Dr. Jenkins Rouge in 2018 for shortness of breath and abnormal myocardial perfusion study and her symptoms were felt to be noncardiac in etiology  She sees me today because of progressive chest pain. She is seeing pulmonary tomorrow for chronic cough and shortness of breath For the last year she has had chest pain that she  describes as nearly constant pressure substernal radiates to the back and at times to the right upper extremity and right neck. It is worse with physical effort it is improved with rest but it never really goes away. She has had 1 episode in the last few weeks where it awaken her from her sleep at night was very severe in nature almost caused her to go the emergency room. She struggles with physical effort has become sedentary and also has exertional shortness of breath. No edema orthopnea palpitation or syncope She has no known history of congenital or rheumatic heart disease With the episode nocturnal chest pain she said she became very diaphoretic. She has vague chest discomfort now in my office and we will repeat an EKG before she leaves.  We discussed modalities for further evaluation either cardiac CTA  or direct referral to coronary angiography and if her EKG is reassuring we will set her up as an urgent cardiac CTA. In the interim we will start a statin her LDL cholesterol is 155 on low-dose beta-blocker and give her a prescription for nitroglycerin for severe symptoms. She has no dye allergy or renal disease  Past Medical History:  Diagnosis Date   Arthritis    "spine" (07/28/2014)   Blood dyscrasia    bruises and bleed easily   Breast cancer, left breast ([place]) 07/28/2014   Breast cancer, right breast ([place]) 1995   Chronic back pain    "all over"   Complication of anesthesia    went to sleep easily but hard to wake up up until elbow OR in 2010   Depression    Dyspnea    Normal Spirometry 03/2008 EF 65% BNP normal 11/2007   Family history of breast cancer    Family history of colon cancer    Family history of pancreatic cancer    Gastric polyps    GERD (gastroesophageal reflux disease)    History of chicken pox    History of hiatal hernia    Hx of adenomatous polyp of colon 07/03/2015   Kidney stone    right kidney    Lichen sclerosus    Melanoma ([place]) 2010   "right elbow;  treated at [facility]"   Mild anxiety    Vitamin B12 deficiency    Vitamin D deficiency     Past Surgical History:  Procedure Laterality Date   BREAST BIOPSY Left 04/2014   BREAST RECONSTRUCTION WITH PLACEMENT OF TISSUE EXPANDER AND FLEX HD (ACELLULAR HYDRATED DERMIS) Left 07/28/2014   Procedure: LEFT BREAST RECONSTRUCTION PLACEMENT OF LEFT TISSUE EXPANDER ;  Surgeon: Crissie Reese, MD;  Location: [facility];  Service: Plastics;  Laterality: Left;   BREAST RECONSTRUCTION WITH PLACEMENT OF TISSUE EXPANDER AND FLEX HD (ACELLULAR HYDRATED DERMIS) Left 09/08/2014   Procedure: REMOVAL OF TISSUE EXPANDER FROM LEFT BREAST;  Surgeon: Crissie Reese, MD;  Location: [facility];  Service: Plastics;  Laterality: Left;   BUNIONECTOMY Bilateral 1970's  COLONOSCOPY     Dr Sharlett Iles   CYSTOSCOPY WITH RETROGRADE PYELOGRAM, URETEROSCOPY AND STENT PLACEMENT Right 06/09/2015   Procedure: CYSTOSCOPY WITH RIGHT RETROGRADE PYELOGRAM, RIGHT URETEROSCOPY AND RIGHT URTERAL STENT PLACEMENT;  Surgeon: Ardis Hughs, MD;  Location: [facility];  Service: Urology;  Laterality: Right;   ESOPHAGOGASTRODUODENOSCOPY     HERNIA REPAIR     HOLMIUM LASER APPLICATION Right 01/03/9832   Procedure: HOLMIUM LASER APPLICATION;  Surgeon: Ardis Hughs, MD;  Location: [facility];  Service: Urology;  Laterality: Right;   LATISSIMUS FLAP TO BREAST Left 09/08/2014   Procedure: LEFT LATISSIMUS FLAP TO BREAST WITH SALINE IMPLANT FOR BREAST RECONSTRUCTION;  Surgeon: Crissie Reese, MD;  Location: [facility];  Service: Plastics;  Laterality: Left;   LUMBAR FUSION  01/2018   L5-S1 transitional segmental laminectomy and Fusion   MASTECTOMY Right 1996    chemotherapy. pt. states 13 lymph nodes were removed   MASTECTOMY COMPLETE / SIMPLE W/ SENTINEL NODE BIOPSY Left 07/28/2014   MASTECTOMY W/ SENTINEL NODE BIOPSY Left 07/28/2014   Procedure: LEFT MASTECTOMY WITH SENTINEL LYMPH NODE MAPPING;  Surgeon: Autumn Messing III, MD;  Location: [facility];  Service: General;  Laterality: Left;    MELANOMA EXCISION Right 2010   From elbow-- Done at [facility]  82/5053   RECONSTRUCTION BREAST IMMEDIATE / DELAYED W/ TISSUE EXPANDER Left 07/28/2014   TEMPOROMANDIBULAR JOINT SURGERY Bilateral 1987   TONSILLECTOMY      Current Medications: Current Meds  Medication Sig   amLODipine (NORVASC) 5 MG tablet Take 1 tablet (5 mg total) by mouth daily.   aspirin EC 81 MG tablet Take 1 tablet (81 mg total) by mouth daily. Swallow whole.   Cholecalciferol (VITAMIN D3 PO) Take 1,000 Units by mouth daily.   citalopram (CELEXA) 20 MG tablet Take 1 tablet (20 mg total) by mouth daily.   cyanocobalamin 2000 MCG tablet Take 1.5 tablets (3,000 mcg total) by mouth daily.   fluticasone (FLONASE) 50 MCG/ACT nasal spray Place 2 sprays into both nostrils as needed for allergies or rhinitis.   furosemide (LASIX) 40 MG tablet Take 1 tablet (40 mg total) by mouth daily.   metoprolol succinate (TOPROL XL) 25 MG 24 hr tablet Take 1 tablet (25 mg total) by mouth daily.   montelukast (SINGULAIR) 10 MG tablet Take 1 tablet by mouth daily.   naproxen sodium (ALEVE) 220 MG tablet Take 220 mg by mouth as needed (headaches).   nitroGLYCERIN (NITROSTAT) 0.4 MG SL tablet Place 1 tablet (0.4 mg total) under the tongue every 5 (five) minutes as needed.   pantoprazole (PROTONIX) 40 MG tablet Take 1 tablet (40 mg total) by mouth 2 (two) times daily.   rosuvastatin (CRESTOR) 10 MG tablet Take 1 tablet (10 mg total) by mouth daily.     Allergies:   Other, Silodosin, Ace inhibitors, Buprenorphine hcl, Morphine and related, Codeine, Sulfonamide derivatives, and Telmisartan   Social History   Socioeconomic History   Marital status: Married    Spouse name: Not on file   Number of children: 2   Years of education: Not on file   Highest education level: Not on file  Occupational History   Occupation: Freight forwarder  Tobacco Use   Smoking status: Never   Smokeless tobacco: Never   Tobacco comments:     Regular Exercise - Yes  Vaping Use   Vaping Use: Never used  Substance and Sexual Activity   Alcohol use: No   Drug use: No  Sexual activity: Yes  Other Topics Concern   Not on file  Social History Narrative   She lives with husband.     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 [place] Strain: Low Risk    Difficulty of Paying Living Expenses: Not hard at all  Food Insecurity: Not on file  Transportation Needs: Not on file  Physical Activity: Inactive   Days of Exercise per Week: 0 days   Minutes of Exercise per Session: 0 min  Stress: No Stress Concern Present   Feeling of Stress : Not at all  Social Connections: Socially Integrated   Frequency of Communication with Friends and Family: More than three times a week   Frequency of Social Gatherings with Friends and Family: More than three times a week   Attends Religious Services: More than 4 times per year   Active Member of [employer] or Organizations: Yes   Attends Music therapist: More than 4 times per year   Marital Status: Married     Family History: The patient's family history includes Anemia in her paternal grandfather; Breast cancer (age of onset: 43) in her cousin; Breast cancer (age of onset: 16) in her cousin; CAD in her father; Cancer in her paternal uncle; Colon cancer (age of onset: 94) in her cousin; Colon cancer (age of onset: 51) in her cousin; Colon polyps in her brother and father; Dementia in her father; Pancreatic cancer (age of onset: 13) in her brother; Stroke in her mother and another family member. There is no history of Esophageal cancer or Stomach cancer.  ROS:   ROS Please see the history of present illness.     All other systems reviewed and are negative.  EKGs/Labs/Other Studies Reviewed:    The following studies were reviewed today:   EKG:  EKG is  ordered today.  The ekg ordered  today is personally reviewed and demonstrates hyperlipidemia  Recent Labs: 10/26/2020: ALT 14; BUN 13; Creatinine, Ser 0.78; Hemoglobin 15.1; Platelets 203; Potassium 3.6; Sodium 141 10/27/2020: TSH 2.070  Recent Lipid Panel    Component Value Date/Time   CHOL 219 (H) 10/27/2020 0050   TRIG 78 10/27/2020 0050   HDL 48 10/27/2020 0050   CHOLHDL 4.6 10/27/2020 0050   VLDL 16 10/27/2020 0050   LDLCALC 155 (H) 10/27/2020 0050   LDLDIRECT 179.7 02/10/2012 0920    Physical Exam:    VS:  BP 130/88 (BP Location: Left Arm, Patient Position: Sitting, Cuff Size: Normal)   Pulse 74   Ht 5\' 5"  (1.651 m)   Wt 178 lb (80.7 kg)   SpO2 97%   BMI 29.62 kg/m     Wt Readings from Last 3 Encounters:  11/02/20 178 lb (80.7 kg)  10/26/20 182 lb (82.6 kg)  10/03/20 184 lb (83.5 kg)     GEN:  Well nourished, well developed in no acute distress HEENT: Normal NECK: No JVD; No carotid bruits LYMPHATICS: No lymphadenopathy CARDIAC: She has no chest wall tenderness RRR, no murmurs, rubs, gallops RESPIRATORY:  Clear to auscultation without rales, wheezing or rhonchi  ABDOMEN: Soft, non-tender, non-distended MUSCULOSKELETAL:  No edema; No deformity  SKIN: Warm and dry NEUROLOGIC:  Alert and oriented x 3 PSYCHIATRIC:  Normal affect     Signed, Shirlee More, MD  11/02/2020 9:19 AM    [place]

## 2020-11-02 ENCOUNTER — Ambulatory Visit: Payer: Medicare HMO | Admitting: Cardiology

## 2020-11-02 ENCOUNTER — Other Ambulatory Visit: Payer: Self-pay

## 2020-11-02 VITALS — BP 130/88 | HR 74 | Ht 65.0 in | Wt 178.0 lb

## 2020-11-02 DIAGNOSIS — I10 Essential (primary) hypertension: Secondary | ICD-10-CM | POA: Diagnosis not present

## 2020-11-02 DIAGNOSIS — R0602 Shortness of breath: Secondary | ICD-10-CM

## 2020-11-02 DIAGNOSIS — R072 Precordial pain: Secondary | ICD-10-CM | POA: Diagnosis not present

## 2020-11-02 DIAGNOSIS — E7800 Pure hypercholesterolemia, unspecified: Secondary | ICD-10-CM

## 2020-11-02 MED ORDER — METOPROLOL SUCCINATE ER 25 MG PO TB24: 25.0000 mg | ORAL_TABLET | Freq: Every day | ORAL | 3 refills | Status: DC

## 2020-11-02 MED ORDER — NITROGLYCERIN 0.4 MG SL SUBL: 0.4000 mg | SUBLINGUAL_TABLET | SUBLINGUAL | 3 refills | Status: DC | PRN

## 2020-11-02 MED ORDER — ROSUVASTATIN CALCIUM 10 MG PO TABS: 10.0000 mg | ORAL_TABLET | Freq: Every day | ORAL | 3 refills | Status: DC

## 2020-11-02 NOTE — Patient Instructions (Signed)
Medication Instructions:  Your physician has recommended you make the following change in your medication START: Rosuvastatin 10 mg take one tablet by mouth daily.  START: Nitroglycerin 0.4 mg take one tablet by mouth every 5 minutes as needed for chest pain up to three times.  START: TOPROL XL 25 MG take one tablet by mouth daily.  *If you need a refill on your cardiac medications before your next appointment, please call your pharmacy*   Lab Work: Your physician recommends that you return for lab work in [place] If you have labs (blood work) drawn today and your tests are completely normal, you will receive your results only by: [place] (if you have MyChart) OR A paper copy in the mail If you have any lab test that is abnormal or we need to change your treatment, we will call you to review the results.   Testing/Procedures: Your cardiac CT will be scheduled at one of the below locations:   [facility] [address] [place], [place] [address] [phone]  [place] [address] [place], [place] [address] [phone]  If scheduled at [facility], please arrive at the [facility] main entrance (entrance A) of [facility] 30 minutes prior to test start time. Proceed to the [facility] Radiology Department (first floor) to check-in and test prep.  If scheduled at [facility], please arrive 15 mins early for check-in and test prep.  Please follow these instructions carefully (unless otherwise directed):  Hold all erectile dysfunction medications at least 3 days (72 hrs) prior to test.  On the Night Before the Test: Be sure to Drink plenty of water. Do not consume any caffeinated/decaffeinated beverages or chocolate 12 hours prior to your test. Do not take any antihistamines 12 hours prior to your test. If the patient has contrast allergy: Patient will need  a prescription for Prednisone and very clear instructions (as follows): Prednisone 50 mg - take 13 hours prior to test Take another Prednisone 50 mg 7 hours prior to test Take another Prednisone 50 mg 1 hour prior to test Take Benadryl 50 mg 1 hour prior to test Patient must complete all four doses of above prophylactic medications. Patient will need a ride after test due to Benadryl.  On the Day of the Test: Drink plenty of water until 1 hour prior to the test. Do not eat any food 4 hours prior to the test. You may take your regular medications prior to the test.  Take metoprolol (Lopressor) two hours prior to test. HOLD Furosemide/Hydrochlorothiazide morning of the test. FEMALES- please wear underwire-free bra if available   *For Clinical Staff only. Please instruct patient the following:* Heart Rate Medication Recommendations for Cardiac CT  Resting HR < 50 bpm  No medication  Resting HR 50-60 bpm and BP >110/50 mmHG   Consider Metoprolol tartrate 25 mg PO 90-120 min prior to scan  Resting HR 60-65 bpm and BP >110/50 mmHG  Metoprolol tartrate 50 mg PO 90-120 minutes prior to scan   Resting HR > 65 bpm and BP >110/50 mmHG  Metoprolol tartrate 100 mg PO 90-120 minutes prior to scan  Consider Ivabradine 10-15 mg PO or a calcium channel blocker for resting HR >60 bpm and contraindication to metoprolol tartrate  Consider Ivabradine 10-15 mg PO in combination with metoprolol tartrate for HR >80 bpm         After the Test: Drink  plenty of water. After receiving IV contrast, you may experience a mild flushed feeling. This is normal. On occasion, you may experience a mild rash up to 24 hours after the test. This is not dangerous. If this occurs, you can take Benadryl 25 mg and increase your fluid intake. If you experience trouble breathing, this can be serious. If it is severe call 911 IMMEDIATELY. If it is mild, please call our office. If you take any of these medications:  Glipizide/Metformin, Avandament, Glucavance, please do not take 48 hours after completing test unless otherwise instructed.   Once we have confirmed authorization from your insurance company, we will call you to set up a date and time for your test. Based on how quickly your insurance processes prior authorizations requests, please allow up to 4 weeks to be contacted for scheduling your Cardiac CT appointment. Be advised that routine Cardiac CT appointments could be scheduled as many as 8 weeks after your provider has ordered it.  For non-scheduling related questions, please contact the cardiac imaging nurse navigator should you have any questions/concerns: Marchia Bond, Cardiac Imaging Nurse Navigator Gordy Clement, Cardiac Imaging Nurse Navigator [facility] Direct Office Dial: [phone]   For scheduling needs, including cancellations and rescheduling, please call [place], [phone].    Follow-Up: At [facility], you and your health needs are our priority.  As part of our continuing mission to provide you with exceptional heart care, we have created designated Provider Care Teams.  These Care Teams include your primary Cardiologist (physician) and Advanced Practice Providers (APPs -  Physician Assistants and Nurse Practitioners) who all work together to provide you with the care you need, when you need it.  We recommend signing up for the patient portal called "MyChart".  Sign up information is provided on this After Visit Summary.  MyChart is used to connect with patients for Virtual Visits (Telemedicine).  Patients are able to view lab/test results, encounter notes, upcoming appointments, etc.  Non-urgent messages can be sent to your provider as well.   To learn more about what you can do with MyChart, go to [email or web address].    Your next appointment:   1 month(s)  The format for your next appointment:   In Person  Provider:   You may see  ANY PROVIDER or the following Advanced Practice Provider on your designated Care Team:      Other Instructions

## 2020-11-03 ENCOUNTER — Ambulatory Visit: Payer: Medicare HMO | Admitting: Emergency Medicine

## 2020-11-03 ENCOUNTER — Encounter: Payer: Self-pay | Admitting: Emergency Medicine

## 2020-11-03 ENCOUNTER — Telehealth: Payer: Self-pay

## 2020-11-03 DIAGNOSIS — R053 Chronic cough: Secondary | ICD-10-CM

## 2020-11-03 LAB — BASIC METABOLIC PANEL
BUN/Creatinine Ratio: 15 (ref 12–28)
BUN: 14 mg/dL (ref 8–27)
CO2: 26 mmol/L (ref 20–29)
Calcium: 9.7 mg/dL (ref 8.7–10.3)
Chloride: 102 mmol/L (ref 96–106)
Creatinine, Ser: 0.91 mg/dL (ref 0.57–1.00)
Glucose: 94 mg/dL (ref 65–99)
Potassium: 4.1 mmol/L (ref 3.5–5.2)
Sodium: 142 mmol/L (ref 134–144)
eGFR: 66 mL/min/{1.73_m2} (ref >59)

## 2020-11-03 MED ORDER — PREDNISONE 20 MG PO TABS: ORAL_TABLET | ORAL | 0 refills | Status: DC

## 2020-11-03 MED ORDER — BENZONATATE 200 MG PO CAPS: 200.0000 mg | ORAL_CAPSULE | Freq: Three times a day (TID) | ORAL | 1 refills | Status: DC | PRN

## 2020-11-03 NOTE — Telephone Encounter (Signed)
-----   Message from Richardo Priest, MD sent at 11/03/2020  7:40 AM EDT ----- Normal or stable result  For cardiac CTA

## 2020-11-03 NOTE — Progress Notes (Signed)
Subjective:    Patient ID: [name], female    DOB: 1946/02/15, 75 y.o.   MRN: [id number]  HPI 75 year old never smoker with a history of bilateral breast cancer, GERD with a hiatal hernia post Nissen, melanoma, chronic back pain.  She is here to evaluate cough.  She is been seen in 2018 at which time it was speculated that she may need to increase her rhinitis and GERD therapy: Chlorpheniramine and Dymista were started, PPI increased. She describes a globus sensation that happens intermittently, leads to paroxysms of cough. She does not feel any GERD but is treated for it. She has intermittent nasal congestion. The cough seems to have become active again about 3 months - unclear what started it. Seems to be worse when it is warm. She is seeing cardiology because she has been experiencing some chest pressure, can happen at rest, and at random.   For last 2 months managed on Singulair, uses fluticasone nasal spray as needed, Protonix 40 mg twice daily  She has had some dysphagia and underwent barium swallow/esophagram 10/12/2020 that showed normal esophageal mucosa, impaired peristalsis, postoperative changes from her Nissen.  No evidence of recurrent hiatal hernia, mass, stricture or ring.  The barium tablet transited easily into the stomach  CT-PA 10/02/2020 reviewed by me, shows no evidence of PE, a patulous esophagus (stable), patchy atelectasis with some mosaic changes without any evidence of septal thickening of unclear significance.  Question consistent with air trapping  Chest x-ray 10/26/2020 reviewed by me, showed no infiltrates, normal appearance.   Review of Systems As per HPI  Past Medical History:  Diagnosis Date   Arthritis    "spine" (07/28/2014)   Blood dyscrasia    bruises and bleed easily   Breast cancer, left breast ([place]) 07/28/2014   Breast cancer, right breast ([place]) 1995   Chronic back pain    "all over"   Complication of anesthesia    went to sleep easily but hard  to wake up up until elbow OR in 2010   Depression    Dyspnea    Normal Spirometry 03/2008 EF 65% BNP normal 11/2007   Family history of breast cancer    Family history of colon cancer    Family history of pancreatic cancer    Gastric polyps    GERD (gastroesophageal reflux disease)    History of chicken pox    History of hiatal hernia    Hx of adenomatous polyp of colon 07/03/2015   Kidney stone    right kidney    Lichen sclerosus    Melanoma ([place]) 2010   "right elbow; treated at [facility]"   Mild anxiety    Vitamin B12 deficiency    Vitamin D deficiency      Family History  Problem Relation Age of Onset   Colon cancer Cousin 37       double first cousin   Colon cancer Cousin 45       double first cousin   Colon polyps Father        between 80-20   Dementia Father    CAD Father        CABG at age 25   Stroke Mother    Pancreatic cancer Brother 6   Colon polyps Brother        between 49-20   Cancer Paternal Uncle        NOS   Anemia Paternal Grandfather        pernicious anemia  Breast cancer Cousin 39       double first cousin   Breast cancer Cousin 60       paternal cousin   Stroke Other        F 1st degree relative 60, M 1st degree relative   Esophageal cancer Neg Hx    Stomach cancer Neg Hx      Social History   Socioeconomic History   Marital status: Married    Spouse name: Not on file   Number of children: 2   Years of education: Not on file   Highest education level: Not on file  Occupational History   Occupation: Freight forwarder  Tobacco Use   Smoking status: Never   Smokeless tobacco: Never   Tobacco comments:    Regular Exercise - Yes  Vaping Use   Vaping Use: Never used  Substance and Sexual Activity   Alcohol use: No   Drug use: No   Sexual activity: Yes  Other Topics Concern   Not on file  Social History Narrative   She lives with husband.     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 [place] Strain: Low Risk    Difficulty of Paying Living Expenses: Not hard at all  Food Insecurity: Not on file  Transportation Needs: Not on file  Physical Activity: Inactive   Days of Exercise per Week: 0 days   Minutes of Exercise per Session: 0 min  Stress: No Stress Concern Present   Feeling of Stress : Not at all  Social Connections: Socially Integrated   Frequency of Communication with Friends and Family: More than three times a week   Frequency of Social Gatherings with Friends and Family: More than three times a week   Attends Religious Services: More than 4 times per year   Active Member of [employer] or Organizations: Yes   Attends Music therapist: More than 4 times per year   Marital Status: Married  Human resources officer Violence: Not At Risk   Fear of Current or Ex-Partner: No   Emotionally Abused: No   Physically Abused: No   Sexually Abused: No    [place], exposed to horses, llamas, cats Has run a storage facility, no recent exposures but has had a mold exposure in the past  Allergies  Allergen Reactions   Other Anaphylaxis and Swelling    Glue (eye lash glue, gorilla glue)   Silodosin Rash    Facial rash   Ace Inhibitors Other (See Comments)    REACTION: angioedema   Buprenorphine Hcl Other (See Comments)    "crazy"   Morphine And Related Other (See Comments)    "crazy" "crazy"   Codeine Rash   Sulfonamide Derivatives Rash   Telmisartan Rash     Outpatient Medications Prior to Visit  Medication Sig Dispense Refill   amLODipine (NORVASC) 5 MG tablet Take 1 tablet (5 mg total) by mouth daily. 30 tablet 11   aspirin EC 81 MG tablet Take 1 tablet (81 mg total) by mouth daily. Swallow whole. 150 tablet 2   Cholecalciferol (VITAMIN D3 PO) Take 1,000 Units by mouth daily.     citalopram (CELEXA) 20 MG tablet Take 1 tablet (20 mg total) by mouth daily. 90 tablet 1   cyanocobalamin 2000 MCG  tablet Take 1.5 tablets (3,000 mcg total) by mouth daily. 90 tablet  fluticasone (FLONASE) 50 MCG/ACT nasal spray Place 2 sprays into both nostrils as needed for allergies or rhinitis.     furosemide (LASIX) 40 MG tablet Take 1 tablet (40 mg total) by mouth daily. 90 tablet 1   metoprolol succinate (TOPROL XL) 25 MG 24 hr tablet Take 1 tablet (25 mg total) by mouth daily. 90 tablet 3   montelukast (SINGULAIR) 10 MG tablet Take 1 tablet by mouth daily.     naproxen sodium (ALEVE) 220 MG tablet Take 220 mg by mouth as needed (headaches).     nitroGLYCERIN (NITROSTAT) 0.4 MG SL tablet Place 1 tablet (0.4 mg total) under the tongue every 5 (five) minutes as needed. 30 tablet 3   pantoprazole (PROTONIX) 40 MG tablet Take 1 tablet (40 mg total) by mouth 2 (two) times daily. 60 tablet 8   rosuvastatin (CRESTOR) 10 MG tablet Take 1 tablet (10 mg total) by mouth daily. 90 tablet 3   No facility-administered medications prior to visit.         Objective:   Physical Exam Vitals:   11/03/20 1601  BP: 128/74  Pulse: 92  Temp: (!) 97.4 F (36.3 C)  TempSrc: Temporal  SpO2: 99%  Weight: 182 lb (82.6 kg)  Height: 5\' 5"  (1.651 m)   Gen: Pleasant, well-nourished, in no distress,  normal affect, frequent throat clearing  ENT: No lesions,  mouth clear,  oropharynx clear, no postnasal drip, intermittent hoarse voice  Neck: No JVD, no stridor  Lungs: No use of accessory muscles, no crackles or wheezing on normal respiration, no wheeze on forced expiration  Cardiovascular: RRR, heart sounds normal, no murmur or gallops, no peripheral edema  Musculoskeletal: No deformities, no cyanosis or clubbing  Neuro: alert, awake, non focal  Skin: Warm, no lesions or rash     Assessment & Plan:  Chronic cough Chronic and recurrent.  Sounds like upper airway irritation syndrome.  She benefited from GERD and rhinitis therapy in the past.  We will try to treat both aggressively.  I talked to her  extensively about avoiding throat clearing and cough suppression.  She may have had some air trapping on CT chest and I believe she would benefit from pulmonary function testing.  She agrees.  We will perform pulmonary function testing at your next office visit We will work on cough suppression and also treatment of the potential underlying contributors to sustained cough Please continue your Protonix 40 mg twice daily. Please continue Singulair 10 mg each evening. Start taking loratadine 10 mg (generic Claritin) once daily. Start using your fluticasone nasal spray, 2 sprays each nostril once daily every day Remember to avoid throat clearing if at all possible. Try using sugar-free candy and just swallow instead of clearing.  You would probably benefit from a dedicated period of voice rest. You may need to do this over a weekend. Only write notes! Use tessalon perles 200 mg up to every 6 hours if needed for cough suppression Use Delsym as directed up to every 12 hours if needed to suppress cough. Take prednisone 20mg  daily for 5 days Follow with Dr. Lamonte Sakai next available with full pulmonary function testing on the same day.   Baltazar Apo, MD, PhD 11/03/2020, 4:42 PM [facility] [phone] or if no answer before 7:00PM call [phone] For any issues after 7:00PM please call eLink [phone]

## 2020-11-03 NOTE — Patient Instructions (Addendum)
We will perform pulmonary function testing at your next office visit We will work on cough suppression and also treatment of the potential underlying contributors to sustained cough Please continue your Protonix 40 mg twice daily. Please continue Singulair 10 mg each evening. Start taking loratadine 10 mg (generic Claritin) once daily. Start using your fluticasone nasal spray, 2 sprays each nostril once daily every day Remember to avoid throat clearing if at all possible. Try using sugar-free candy and just swallow instead of clearing.  You would probably benefit from a dedicated period of voice rest. You may need to do this over a weekend. Only write notes! Use tessalon perles 200 mg up to every 6 hours if needed for cough suppression Use Delsym as directed up to every 12 hours if needed to suppress cough. Take prednisone 20mg  daily for 5 days Follow with Dr. Lamonte Sakai next available with full pulmonary function testing on the same day.

## 2020-11-03 NOTE — Assessment & Plan Note (Addendum)
Chronic and recurrent.  Sounds like upper airway irritation syndrome.  She benefited from GERD and rhinitis therapy in the past.  We will try to treat both aggressively.  I talked to her extensively about avoiding throat clearing and cough suppression.  She may have had some air trapping on CT chest and I believe she would benefit from pulmonary function testing.  She agrees.  We will perform pulmonary function testing at your next office visit We will work on cough suppression and also treatment of the potential underlying contributors to sustained cough Please continue your Protonix 40 mg twice daily. Please continue Singulair 10 mg each evening. Start taking loratadine 10 mg (generic Claritin) once daily. Start using your fluticasone nasal spray, 2 sprays each nostril once daily every day Remember to avoid throat clearing if at all possible. Try using sugar-free candy and just swallow instead of clearing.  You would probably benefit from a dedicated period of voice rest. You may need to do this over a weekend. Only write notes! Use tessalon perles 200 mg up to every 6 hours if needed for cough suppression Use Delsym as directed up to every 12 hours if needed to suppress cough. Take prednisone 20mg  daily for 5 days Follow with Dr. Lamonte Sakai next available with full pulmonary function testing on the same day.

## 2020-11-03 NOTE — Telephone Encounter (Signed)
Spoke with patient regarding results and recommendation.  Patient verbalizes understanding and is agreeable to plan of care. Advised patient to call back with any issues or concerns.  

## 2020-11-07 ENCOUNTER — Other Ambulatory Visit: Payer: Self-pay

## 2020-11-07 ENCOUNTER — Ambulatory Visit (INDEPENDENT_AMBULATORY_CARE_PROVIDER_SITE_OTHER): Payer: Medicare HMO | Admitting: Family

## 2020-11-07 ENCOUNTER — Encounter: Payer: Self-pay | Admitting: Family

## 2020-11-07 VITALS — BP 132/68 | HR 71 | Temp 98.9°F | Ht 65.0 in | Wt 178.0 lb

## 2020-11-07 DIAGNOSIS — I10 Essential (primary) hypertension: Secondary | ICD-10-CM

## 2020-11-07 DIAGNOSIS — R790 Abnormal level of blood mineral: Secondary | ICD-10-CM

## 2020-11-07 LAB — CBC WITH DIFFERENTIAL/PLATELET
Basophils Absolute: 0.1 10*3/uL (ref 0.0–0.1)
Basophils Relative: 0.8 % (ref 0.0–3.0)
Eosinophils Absolute: 0.2 10*3/uL (ref 0.0–0.7)
Eosinophils Relative: 1.8 % (ref 0.0–5.0)
HCT: 41.2 % (ref 36.0–46.0)
Hemoglobin: 14.1 g/dL (ref 12.0–15.0)
Lymphocytes Relative: 27.1 % (ref 12.0–46.0)
Lymphs Abs: 2.2 10*3/uL (ref 0.7–4.0)
MCHC: 34.2 g/dL (ref 30.0–36.0)
MCV: 91.3 fl (ref 78.0–100.0)
Monocytes Absolute: 0.7 10*3/uL (ref 0.1–1.0)
Monocytes Relative: 8 % (ref 3.0–12.0)
Neutro Abs: 5.1 10*3/uL (ref 1.4–7.7)
Neutrophils Relative %: 62.3 % (ref 43.0–77.0)
Platelets: 178 10*3/uL (ref 150.0–400.0)
RBC: 4.51 Mil/uL (ref 3.87–5.11)
RDW: 12.9 % (ref 11.5–15.5)
WBC: 8.2 10*3/uL (ref 4.0–10.5)

## 2020-11-07 LAB — COMPREHENSIVE METABOLIC PANEL
ALT: 10 U/L (ref 0–35)
AST: 15 U/L (ref 0–37)
Albumin: 4.3 g/dL (ref 3.5–5.2)
Alkaline Phosphatase: 64 U/L (ref 39–117)
BUN: 17 mg/dL (ref 6–23)
CO2: 31 mEq/L (ref 19–32)
Calcium: 9.4 mg/dL (ref 8.4–10.5)
Chloride: 105 mEq/L (ref 96–112)
Creatinine, Ser: 0.88 mg/dL (ref 0.40–1.20)
GFR: 64.58 mL/min (ref >60.00)
Glucose, Bld: 92 mg/dL (ref 70–99)
Potassium: 3.8 mEq/L (ref 3.5–5.1)
Sodium: 142 mEq/L (ref 135–145)
Total Bilirubin: 0.7 mg/dL (ref 0.2–1.2)
Total Protein: 6.8 g/dL (ref 6.0–8.3)

## 2020-11-07 LAB — MAGNESIUM: Magnesium: 2 mg/dL (ref 1.5–2.5)

## 2020-11-07 NOTE — Progress Notes (Signed)
[name] is a 75 y.o. female with the following history as recorded in Epic[employer]:  Patient Active Problem List   Diagnosis Date Noted   Dizziness 10/27/2020   CVA (cerebral vascular accident) ([place]) 10/26/2020   Vitamin B12 deficiency    Mild anxiety    Kidney stone    History of hiatal hernia    History of chicken pox    GERD (gastroesophageal reflux disease)    Gastric polyps    Dyspnea    Depression    Complication of anesthesia    Chronic back pain    Blood dyscrasia    Arthritis    Ankle effusion, left 03/23/2020   Rheumatoid arthritis ([place]) 11/02/2018   Episode of recurrent major depressive disorder ([place]) 12/05/2017   Primary osteoarthritis of both hands 07/09/2017   Primary osteoarthritis of both knees 07/09/2017   DDD (degenerative disc disease), cervical 07/09/2017   DDD (degenerative disc disease), lumbar 07/09/2017   Peripheral edema 11/26/2016   Medicare annual wellness visit, subsequent 06/30/2016   Hx of adenomatous polyp of colon 16/02/9603   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   Breast cancer, left breast ([place]) 07/28/2014   Family history of breast cancer    Family history of colon cancer    Family history of pancreatic cancer    Malignant neoplasm of upper-outer quadrant of left breast in female, 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   Peripheral neuropathy 01/01/2013   Melanoma of upper arm ([place]) 02/10/2012   S/P laparoscopic fundoplication 54/01/8118   Steatosis of liver 12/07/2009   COAGULOPATHY 07/12/2008   Melanoma ([place]) 2010   CHEST XRAY, ABNORMAL 01/04/2008   Chronic cough 12/07/2007   Vitamin D deficiency 04/14/2007   VITAMIN B12 DEFICIENCY 02/27/2007   Essential hypertension 02/27/2007   GERD 02/27/2007   Fibromyalgia 02/27/2007   Breast cancer, right breast ([place]) 1995    Current Outpatient Medications  Medication Sig Dispense Refill   amLODipine (NORVASC) 5 MG tablet Take 1 tablet (5 mg total) by mouth daily. 30  tablet 11   aspirin EC 81 MG tablet Take 1 tablet (81 mg total) by mouth daily. Swallow whole. 150 tablet 2   benzonatate (TESSALON) 200 MG capsule Take 1 capsule (200 mg total) by mouth 3 (three) times daily as needed for cough. 30 capsule 1   Cholecalciferol (VITAMIN D3 PO) Take 1,000 Units by mouth daily.     citalopram (CELEXA) 20 MG tablet Take 1 tablet (20 mg total) by mouth daily. 90 tablet 1   cyanocobalamin 2000 MCG tablet Take 1.5 tablets (3,000 mcg total) by mouth daily. 90 tablet    fluticasone (FLONASE) 50 MCG/ACT nasal spray Place 2 sprays into both nostrils as needed for allergies or rhinitis.     furosemide (LASIX) 40 MG tablet Take 1 tablet (40 mg total) by mouth daily. 90 tablet 1   metoprolol succinate (TOPROL XL) 25 MG 24 hr tablet Take 1 tablet (25 mg total) by mouth daily. 90 tablet 3   montelukast (SINGULAIR) 10 MG tablet Take 1 tablet by mouth daily.     nitroGLYCERIN (NITROSTAT) 0.4 MG SL tablet Place 1 tablet (0.4 mg total) under the tongue every 5 (five) minutes as needed. 30 tablet 3   pantoprazole (PROTONIX) 40 MG tablet Take 1 tablet (40 mg total) by mouth 2 (two) times daily. 60 tablet 8   predniSONE (DELTASONE) 20 MG tablet Take 1 tablet by mouth once daily for 5 days. 5 tablet 0   rosuvastatin (CRESTOR)  10 MG tablet Take 1 tablet (10 mg total) by mouth daily. 90 tablet 3   No current facility-administered medications for this visit.    Allergies: Other, Silodosin, Ace inhibitors, Buprenorphine hcl, Morphine and related, Codeine, Sulfonamide derivatives, and Telmisartan  Past Medical History:  Diagnosis Date   Arthritis    "spine" (07/28/2014)   Blood dyscrasia    bruises and bleed easily   Breast cancer, left breast ([place]) 07/28/2014   Breast cancer, right breast ([place]) 1995   Chronic back pain    "all over"   Complication of anesthesia    went to sleep easily but hard to wake up up until elbow OR in 2010   Depression    Dyspnea    Normal Spirometry 03/2008  EF 65% BNP normal 11/2007   Family history of breast cancer    Family history of colon cancer    Family history of pancreatic cancer    Gastric polyps    GERD (gastroesophageal reflux disease)    History of chicken pox    History of hiatal hernia    Hx of adenomatous polyp of colon 07/03/2015   Kidney stone    right kidney    Lichen sclerosus    Melanoma ([place]) 2010   "right elbow; treated at [facility]"   Mild anxiety    Vitamin B12 deficiency    Vitamin D deficiency     Past Surgical History:  Procedure Laterality Date   BREAST BIOPSY Left 04/2014   BREAST RECONSTRUCTION WITH PLACEMENT OF TISSUE EXPANDER AND FLEX HD (ACELLULAR HYDRATED DERMIS) Left 07/28/2014   Procedure: LEFT BREAST RECONSTRUCTION PLACEMENT OF LEFT TISSUE EXPANDER ;  Surgeon: Crissie Reese, MD;  Location: [facility];  Service: Plastics;  Laterality: Left;   BREAST RECONSTRUCTION WITH PLACEMENT OF TISSUE EXPANDER AND FLEX HD (ACELLULAR HYDRATED DERMIS) Left 09/08/2014   Procedure: REMOVAL OF TISSUE EXPANDER FROM LEFT BREAST;  Surgeon: Crissie Reese, MD;  Location: [facility];  Service: Plastics;  Laterality: Left;   BUNIONECTOMY Bilateral 1970's   COLONOSCOPY     Dr Sharlett Iles   CYSTOSCOPY WITH RETROGRADE PYELOGRAM, URETEROSCOPY AND STENT PLACEMENT Right 06/09/2015   Procedure: CYSTOSCOPY WITH RIGHT RETROGRADE PYELOGRAM, RIGHT URETEROSCOPY AND RIGHT URTERAL STENT PLACEMENT;  Surgeon: Ardis Hughs, MD;  Location: [facility];  Service: Urology;  Laterality: Right;   ESOPHAGOGASTRODUODENOSCOPY     HERNIA REPAIR     HOLMIUM LASER APPLICATION Right 10/21/7822   Procedure: HOLMIUM LASER APPLICATION;  Surgeon: Ardis Hughs, MD;  Location: [facility];  Service: Urology;  Laterality: Right;   LATISSIMUS FLAP TO BREAST Left 09/08/2014   Procedure: LEFT LATISSIMUS FLAP TO BREAST WITH SALINE IMPLANT FOR BREAST RECONSTRUCTION;  Surgeon: Crissie Reese, MD;  Location: [facility];  Service: Plastics;  Laterality: Left;   LUMBAR FUSION  01/2018   L5-S1  transitional segmental laminectomy and Fusion   MASTECTOMY Right 1996    chemotherapy. pt. states 13 lymph nodes were removed   MASTECTOMY COMPLETE / SIMPLE W/ SENTINEL NODE BIOPSY Left 07/28/2014   MASTECTOMY W/ SENTINEL NODE BIOPSY Left 07/28/2014   Procedure: LEFT MASTECTOMY WITH SENTINEL LYMPH NODE MAPPING;  Surgeon: Autumn Messing III, MD;  Location: [facility];  Service: General;  Laterality: Left;   MELANOMA EXCISION Right 2010   From elbow-- Done at [facility]  23/5361   RECONSTRUCTION BREAST IMMEDIATE / DELAYED W/ TISSUE EXPANDER Left 07/28/2014   TEMPOROMANDIBULAR JOINT SURGERY Bilateral 1987   TONSILLECTOMY  Family History  Problem Relation Age of Onset   Colon cancer Cousin 70       double first cousin   Colon cancer Cousin 71       double first cousin   Colon polyps Father        between 80-20   Dementia Father    CAD Father        CABG at age 73   Stroke Mother    Pancreatic cancer Brother 10   Colon polyps Brother        between 29-20   Cancer Paternal Uncle        NOS   Anemia Paternal Grandfather        pernicious anemia   Breast cancer Cousin 12       double first cousin   Breast cancer Cousin 79       paternal cousin   Stroke Other        F 1st degree relative 66, M 1st degree relative   Esophageal cancer Neg Hx    Stomach cancer Neg Hx     Social History   Tobacco Use   Smoking status: Never   Smokeless tobacco: Never   Tobacco comments:    Regular Exercise - Yes  Substance Use Topics   Alcohol use: No    Subjective:  Patient was admitted with CVA from 10/26/20-10/27/20; has been started on Amlodipine/ Toprol XL and Crestor since last OV; Has already seen cardiology in follow up; also working with pulmonology for chronic cough;  Notes that is slowly starting to feel better- still having some fatigue;  Worried that her home blood pressure cuff is not accurate- would like to get it checked with ours in the office;     Objective:   Vitals:   11/07/20 0937  BP: 132/68  Pulse: 71  Temp: 98.9 F (37.2 C)  TempSrc: Oral  SpO2: 98%  Weight: 178 lb (80.7 kg)  Height: 5' 5"  (1.651 m)    General: Well developed, well nourished, in no acute distress  Skin : Warm and dry.  Head: Normocephalic and atraumatic  Lungs: Respirations unlabored; clear to auscultation bilaterally without wheeze, rales, rhonchi  CVS exam: normal rate and regular rhythm.  Neurologic: Alert and oriented; speech intact; face symmetrical; moves all extremities well; CNII-XII intact without focal deficit   Assessment:  1. Essential hypertension   2. Low magnesium level     Plan:  Patient was admitted with concerns for CVA- however MRI of brain and follow up testing do not show infarct;  2. Stable on current regimen- has plans to see her cardiologist in early July; check CBC, CMP; she will bring her cuff back to the office to get it checked for accuracy; Check magnesium level today as well;   This visit occurred during the SARS-CoV-2 public health emergency.  Safety protocols were in place, including screening questions prior to the visit, additional usage of staff PPE, and extensive cleaning of exam room while observing appropriate contact time as indicated for disinfecting solutions.    No follow-ups on file.  Orders Placed This Encounter  Procedures   CBC with Differential/Platelet   Comp Met (CMET)   Magnesium    Requested Prescriptions    No prescriptions requested or ordered in this encounter

## 2020-11-08 ENCOUNTER — Telehealth (HOSPITAL_COMMUNITY): Payer: Self-pay | Admitting: Emergency Medicine

## 2020-11-08 ENCOUNTER — Ambulatory Visit: Payer: Medicare HMO

## 2020-11-08 NOTE — Telephone Encounter (Signed)
Reaching out to patient to offer assistance regarding upcoming cardiac imaging study; pt verbalizes understanding of appt date/time, parking situation and where to check in, pre-test NPO status and medications ordered, and verified current allergies; name and call back number provided for further questions should they arise Marchia Bond RN Navigator Cardiac Imaging [place] [facility] [phone] office [phone] cell  Pt to only take metop 2 hr prior to scan will take rest of daily meds after test Bronwyn Belasco

## 2020-11-09 ENCOUNTER — Other Ambulatory Visit: Payer: Self-pay

## 2020-11-09 ENCOUNTER — Ambulatory Visit (HOSPITAL_COMMUNITY)
Admission: RE | Admit: 2020-11-09 | Discharge: 2020-11-09 | Disposition: A | Discharge status: Home / Self Care | Payer: Medicare HMO | Source: Ambulatory Visit | Attending: Cardiology | Admitting: Cardiology

## 2020-11-09 ENCOUNTER — Other Ambulatory Visit (HOSPITAL_COMMUNITY): Payer: Self-pay | Admitting: Emergency Medicine

## 2020-11-09 DIAGNOSIS — R079 Chest pain, unspecified: Secondary | ICD-10-CM

## 2020-11-09 DIAGNOSIS — F419 Anxiety disorder, unspecified: Secondary | ICD-10-CM

## 2020-11-09 DIAGNOSIS — R072 Precordial pain: Secondary | ICD-10-CM | POA: Diagnosis not present

## 2020-11-09 DIAGNOSIS — R931 Abnormal findings on diagnostic imaging of heart and coronary circulation: Secondary | ICD-10-CM | POA: Diagnosis not present

## 2020-11-09 DIAGNOSIS — R609 Edema, unspecified: Secondary | ICD-10-CM

## 2020-11-09 MED ORDER — NITROGLYCERIN 0.4 MG SL SUBL
SUBLINGUAL_TABLET | SUBLINGUAL | Status: AC
  Filled 2020-11-09: qty 2

## 2020-11-09 MED ORDER — IOHEXOL 350 MG/ML SOLN
95.0000 mL | Freq: Once | INTRAVENOUS | Status: AC | PRN
  Administered 2020-11-09: 95 mL via INTRAVENOUS

## 2020-11-09 MED ORDER — NITROGLYCERIN 0.4 MG SL SUBL
0.8000 mg | SUBLINGUAL_TABLET | Freq: Once | SUBLINGUAL | Status: AC
  Administered 2020-11-09: 0.8 mg via SUBLINGUAL

## 2020-11-09 MED ORDER — FUROSEMIDE 40 MG PO TABS: 40.0000 mg | ORAL_TABLET | Freq: Every day | ORAL | 1 refills | Status: DC

## 2020-11-09 MED ORDER — CITALOPRAM HYDROBROMIDE 20 MG PO TABS: 20.0000 mg | ORAL_TABLET | Freq: Every day | ORAL | 1 refills | Status: DC

## 2020-11-09 MED ORDER — METOPROLOL TARTRATE 5 MG/5ML IV SOLN
INTRAVENOUS | Status: AC
  Administered 2020-11-09: 10 mg via INTRAVENOUS
  Filled 2020-11-09: qty 10

## 2020-11-09 MED ORDER — METOPROLOL TARTRATE 5 MG/5ML IV SOLN: 10.0000 mg | INTRAVENOUS | Status: DC | PRN

## 2020-11-10 ENCOUNTER — Other Ambulatory Visit: Payer: Self-pay

## 2020-11-10 ENCOUNTER — Ambulatory Visit (INDEPENDENT_AMBULATORY_CARE_PROVIDER_SITE_OTHER): Payer: Medicare HMO | Admitting: Family

## 2020-11-10 ENCOUNTER — Telehealth: Payer: Self-pay

## 2020-11-10 VITALS — BP 136/72 | HR 65

## 2020-11-10 DIAGNOSIS — I10 Essential (primary) hypertension: Secondary | ICD-10-CM | POA: Diagnosis not present

## 2020-11-10 NOTE — Telephone Encounter (Signed)
-----   Message from Richardo Priest, MD sent at 11/09/2020  8:11 PM EDT ----- As expected she has coronary artery disease but does not look severe I do not think she will need a heart catheterization I expect her to do well on her medications and we can discuss further at office follow-up.

## 2020-11-10 NOTE — Progress Notes (Signed)
Pt here for Blood pressure check per  Jodi Mourning  Pt currently takes: amlodipine 5 mg, metoprolol 25 mg   Pt reports compliance with medication. She was advised to bring in home monitor due to elevated inconsistent reading  BP today @ = 136/72  HR = 65   Patient brought home monitor  BP= 208/110 HR= 70  Second attempt of using home monitor gave error.   Patient advised per Jodi Mourning, continue medication regimen but it is best to buy a new bp home monitor due to inaccurate reading.

## 2020-11-10 NOTE — Telephone Encounter (Signed)
Spoke with patient regarding results and recommendation.  Patient verbalizes understanding and is agreeable to plan of care. Advised patient to call back with any issues or concerns.  

## 2020-11-22 ENCOUNTER — Telehealth: Payer: Self-pay | Admitting: Cardiology

## 2020-11-22 NOTE — Telephone Encounter (Signed)
Patient informed she needs to take both medications.

## 2020-11-22 NOTE — Telephone Encounter (Signed)
Patient takes amlodipine 5 mg daily. She wants to know if Dr. Bettina Gavia wants her on metoprolol and amlodipine. She was put on amlodipine while in hospital in the beginning of June and then put on metoprolol by Dr. Bettina Gavia 11/02/20. Advised her both meds are on chart but will check with Dr. Bettina Gavia to see if he is ok with her taking both.

## 2020-11-22 NOTE — Telephone Encounter (Signed)
pt states that she was prescribed amlodipine while in the hospital and then was prescribed metoprolol by Dr. Bettina Gavia... pt would like to know if she should be taking both.. please advise

## 2020-11-28 DIAGNOSIS — Z0189 Encounter for other specified special examinations: Secondary | ICD-10-CM | POA: Diagnosis not present

## 2020-11-28 DIAGNOSIS — B029 Zoster without complications: Secondary | ICD-10-CM | POA: Diagnosis not present

## 2020-12-06 ENCOUNTER — Other Ambulatory Visit: Payer: Self-pay

## 2020-12-06 DIAGNOSIS — L900 Lichen sclerosus et atrophicus: Secondary | ICD-10-CM | POA: Insufficient documentation

## 2020-12-07 ENCOUNTER — Encounter: Payer: Self-pay | Admitting: Cardiology

## 2020-12-07 ENCOUNTER — Other Ambulatory Visit: Payer: Self-pay

## 2020-12-07 ENCOUNTER — Ambulatory Visit: Payer: Medicare HMO | Admitting: Cardiology

## 2020-12-07 VITALS — BP 136/84 | HR 70 | Ht 65.0 in | Wt 184.8 lb

## 2020-12-07 DIAGNOSIS — I25118 Atherosclerotic heart disease of native coronary artery with other forms of angina pectoris: Secondary | ICD-10-CM

## 2020-12-07 DIAGNOSIS — I10 Essential (primary) hypertension: Secondary | ICD-10-CM

## 2020-12-07 DIAGNOSIS — E7800 Pure hypercholesterolemia, unspecified: Secondary | ICD-10-CM | POA: Diagnosis not present

## 2020-12-07 MED ORDER — ISOSORBIDE MONONITRATE ER 30 MG PO TB24: 30.0000 mg | ORAL_TABLET | Freq: Every day | ORAL | 3 refills | Status: DC

## 2020-12-07 NOTE — Patient Instructions (Signed)
Medication Instructions:  Your physician has recommended you make the following change in your medication:   Start Imdur 30 mg daily.  *If you need a refill on your cardiac medications before your next appointment, please call your pharmacy*   Lab Work: Your physician recommends that you have a CMP and lipids today in the office.  If you have labs (blood work) drawn today and your tests are completely normal, you will receive your results only by: [place] (if you have MyChart) OR A paper copy in the mail If you have any lab test that is abnormal or we need to change your treatment, we will call you to review the results.   Testing/Procedures: None ordered   Follow-Up: At [facility], you and your health needs are our priority.  As part of our continuing mission to provide you with exceptional heart care, we have created designated Provider Care Teams.  These Care Teams include your primary Cardiologist (physician) and Advanced Practice Providers (APPs -  Physician Assistants and Nurse Practitioners) who all work together to provide you with the care you need, when you need it.  We recommend signing up for the patient portal called "MyChart".  Sign up information is provided on this After Visit Summary.  MyChart is used to connect with patients for Virtual Visits (Telemedicine).  Patients are able to view lab/test results, encounter notes, upcoming appointments, etc.  Non-urgent messages can be sent to your provider as well.   To learn more about what you can do with MyChart, go to [email or web address].    Your next appointment:   6 month(s)  The format for your next appointment:   In Person  Provider:   Shirlee More, MD   Other Instructions NA

## 2020-12-07 NOTE — Progress Notes (Signed)
Cardiology Office Note:    Date:  12/07/2020   ID:  [name], DOB Jan 11, 1946, MRN [id number]  PCP:  Marrian Salvage, [place]  Cardiologist:  Shirlee More, MD    Referring MD: Marrian Salvage,*    ASSESSMENT:    1. Coronary artery disease of native artery of native heart with stable angina pectoris ([place])   2. Pure hypercholesterolemia   3. Essential hypertension    PLAN:    In order of problems listed above:  She has moderate coronary stenosis not flow-limiting and is reassured she does not need PCI or stent.  Continue treatment including aspirin new addition of calcium channel blocker beta-blocker and add oral nitrate with ongoing angina.  I encouraged her to get a regular activity program 20 to 30 minutes a day She is on a high intensity statin continue and check a lipid profile for affect Improved BP at target continue treatment including the addition of amlodipine recently   Next appointment: 3 months   Medication Adjustments/Labs and Tests Ordered: Current medicines are reviewed at length with the patient today.  Concerns regarding medicines are outlined above.  Orders Placed This Encounter  Procedures   Comprehensive metabolic panel   Lipid panel   Meds ordered this encounter  Medications   isosorbide mononitrate (IMDUR) 30 MG 24 hr tablet    Sig: Take 1 tablet (30 mg total) by mouth daily.    Dispense:  90 tablet    Refill:  3    Chief complaint: Follow-up after cardiac CTA   History of Present Illness:    [name] is a 75 y.o. female with a hx of hypertension aortic stenosis last seen 11/02/2020.Marland Kitchen Compliance with diet, lifestyle and medications: Yes including calcium channel blocker added for hypertension  I have reviewed her cardiac CT with her she is compliant with her statin is still having exertional and nonexertional angina relieved with rest and nitroglycerin.  Symptoms are not severe but they are occurring several times a week.  I  offered cardiac rehabilitation she declined and said she will start a walking program. She tolerates her statin without muscle pain or weakness.  Blood pressure is in range and no recurrent episodes of dizziness.  Coronary CT calcium score reported 11/09/2020 showed calcium score of 63 55th percentile and moderate stenosis in the first diagonal and proximal ramus artery.  Subsequent FFR was normal in both vessels.  Echocardiogram 10/27/2020 showed moderate left ventricular hypertrophy ejection fraction normal 60 to 40% normal diastolic filling pressure the right ventricle is normal in size and function and aortic valve sclerosis without stenosis.  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  She has felt to have a hypertensive urgency she was initiated on antihypertensive therapy with amlodipine subsequent MRI of the brain did not show infarction.  Blood pressure prior to discharge ranged from 143-154 85-103. Past Medical History:  Diagnosis Date   Ankle effusion, left 03/23/2020   Arthritis    "spine" (07/28/2014)   Blood dyscrasia    bruises and bleed easily   Breast cancer, left breast ([place]) 07/28/2014   Breast cancer, right breast ([place]) [place], ABNORMAL 01/04/2008   Qualifier: Diagnosis of  By: Plotnikov MD, Evie Lacks    Chronic back pain    "all over"   Chronic cough 12/07/2007   Could  be related to GERD - relapsed    COAGULOPATHY 07/12/2008   Qualifier: Diagnosis of  By: Nils Pyle CMA (AAMA), Leisha     Complication of anesthesia    went to sleep easily but hard to wake up up until elbow OR in 2010   CVA (cerebral vascular accident) ([place]) 10/26/2020   DDD (degenerative disc disease), cervical 07/09/2017   DDD (degenerative disc disease), lumbar 07/09/2017   With spinal stenosis.   Dr. Trenton Gammon   Depression    Dizziness 10/27/2020   Dyspnea     Normal Spirometry 03/2008 EF 65% BNP normal 11/2007   Episode of recurrent major depressive disorder ([place]) 12/05/2017   Essential hypertension 02/27/2007   Mild Off meds 2013  BP Readings from Last 3 Encounters:  02/10/12 128/90  08/09/11 134/62  03/06/11 150/86      Family history of breast cancer    Family history of colon cancer    Family history of pancreatic cancer    Fibromyalgia 02/27/2007   Chronic    Gastric polyps    GERD 02/27/2007   S/p Nissen's 2010    GERD (gastroesophageal reflux disease)    History of chicken pox    History of hiatal hernia    Hx of adenomatous polyp of colon 07/03/2015   Kidney stone    right kidney    Lichen sclerosus    Malignant neoplasm of upper-outer quadrant of left breast in female, estrogen receptor positive ([place]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   Medicare annual wellness visit, subsequent 06/30/2016   Melanoma ([place]) 2010   "right elbow; [facility]"   Melanoma of upper arm ([place]) 02/10/2012   2010 R  Elbow ([facility])   Mild anxiety    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   Peripheral edema 11/26/2016   Peripheral neuropathy 01/01/2013   Primary osteoarthritis of both hands 07/09/2017   Primary osteoarthritis of both knees 07/09/2017   Chondromalacia patella   Rheumatoid arthritis ([place]) 11/02/2018   S/P laparoscopic fundoplication 01/12/2992   Steatosis of liver 12/07/2009   Qualifier: Diagnosis of  By: Surface RN, Butch Penny     Vitamin B12 deficiency    VITAMIN B12 DEFICIENCY 02/27/2007   Chronic    Vitamin D deficiency     Past Surgical History:  Procedure Laterality Date   BREAST BIOPSY Left 04/2014   BREAST RECONSTRUCTION WITH PLACEMENT OF TISSUE EXPANDER AND FLEX HD (ACELLULAR HYDRATED DERMIS) Left 07/28/2014   Procedure: LEFT BREAST RECONSTRUCTION PLACEMENT OF LEFT TISSUE EXPANDER ;  Surgeon: Crissie Reese, MD;  Location: [place];  Service: Plastics;  Laterality: Left;   BREAST RECONSTRUCTION WITH PLACEMENT OF TISSUE EXPANDER AND FLEX HD (ACELLULAR HYDRATED  DERMIS) Left 09/08/2014   Procedure: REMOVAL OF TISSUE EXPANDER FROM LEFT BREAST;  Surgeon: Crissie Reese, MD;  Location: [place];  Service: Plastics;  Laterality: Left;   BUNIONECTOMY Bilateral 1970's   COLONOSCOPY     Dr Sharlett Iles   CYSTOSCOPY WITH RETROGRADE PYELOGRAM, URETEROSCOPY AND STENT PLACEMENT Right 06/09/2015   Procedure: CYSTOSCOPY WITH RIGHT RETROGRADE PYELOGRAM, RIGHT URETEROSCOPY AND RIGHT URTERAL STENT PLACEMENT;  Surgeon: Ardis Hughs, MD;  Location: [facility];  Service: Urology;  Laterality: Right;   ESOPHAGOGASTRODUODENOSCOPY     HERNIA REPAIR     HOLMIUM LASER APPLICATION Right 12/09/9676   Procedure: HOLMIUM LASER APPLICATION;  Surgeon: Ardis Hughs, MD;  Location: [facility];  Service: Urology;  Laterality: Right;   LATISSIMUS FLAP TO BREAST Left 09/08/2014   Procedure: LEFT LATISSIMUS FLAP TO BREAST WITH SALINE  IMPLANT FOR BREAST RECONSTRUCTION;  Surgeon: Crissie Reese, MD;  Location: [place];  Service: Plastics;  Laterality: Left;   LUMBAR FUSION  01/2018   L5-S1 transitional segmental laminectomy and Fusion   MASTECTOMY Right 1996    chemotherapy. pt. states 13 lymph nodes were removed   MASTECTOMY COMPLETE / SIMPLE W/ SENTINEL NODE BIOPSY Left 07/28/2014   MASTECTOMY W/ SENTINEL NODE BIOPSY Left 07/28/2014   Procedure: LEFT MASTECTOMY WITH SENTINEL LYMPH NODE MAPPING;  Surgeon: Autumn Messing III, MD;  Location: [place];  Service: General;  Laterality: Left;   MELANOMA EXCISION Right 2010   From elbow-- Done at [facility]  54/2706   RECONSTRUCTION BREAST IMMEDIATE / DELAYED W/ TISSUE EXPANDER Left 07/28/2014   TEMPOROMANDIBULAR JOINT SURGERY Bilateral 1987   TONSILLECTOMY      Current Medications: Current Meds  Medication Sig   amLODipine (NORVASC) 5 MG tablet Take 1 tablet (5 mg total) by mouth daily.   aspirin EC 81 MG tablet Take 1 tablet (81 mg total) by mouth daily. Swallow whole.   benzonatate (TESSALON) 200 MG capsule Take 1 capsule (200 mg total)  by mouth 3 (three) times daily as needed for cough.   Cholecalciferol (VITAMIN D3 PO) Take 1,000 Units by mouth daily.   citalopram (CELEXA) 20 MG tablet Take 1 tablet (20 mg total) by mouth daily.   cyanocobalamin 2000 MCG tablet Take 1.5 tablets (3,000 mcg total) by mouth daily.   fluticasone (FLONASE) 50 MCG/ACT nasal spray Place 2 sprays into both nostrils as needed for allergies or rhinitis.   furosemide (LASIX) 40 MG tablet Take 1 tablet (40 mg total) by mouth daily.   isosorbide mononitrate (IMDUR) 30 MG 24 hr tablet Take 1 tablet (30 mg total) by mouth daily.   metoprolol succinate (TOPROL XL) 25 MG 24 hr tablet Take 1 tablet (25 mg total) by mouth daily.   montelukast (SINGULAIR) 10 MG tablet Take 1 tablet by mouth daily.   nitroGLYCERIN (NITROSTAT) 0.4 MG SL tablet Place 0.4 mg under the tongue every 5 (five) minutes as needed for chest pain.   pantoprazole (PROTONIX) 40 MG tablet Take 1 tablet (40 mg total) by mouth 2 (two) times daily.   rosuvastatin (CRESTOR) 10 MG tablet Take 1 tablet (10 mg total) by mouth daily.   valACYclovir (VALTREX) 1000 MG tablet Take 1,000 mg by mouth 3 (three) times daily.     Allergies:   Other, Silodosin, Ace inhibitors, Buprenorphine hcl, Morphine and related, Codeine, Sulfonamide derivatives, and Telmisartan   Social History   Socioeconomic History   Marital status: Married    Spouse name: Not on file   Number of children: 2   Years of education: Not on file   Highest education level: Not on file  Occupational History   Occupation: Freight forwarder  Tobacco Use   Smoking status: Never   Smokeless tobacco: Never   Tobacco comments:    Regular Exercise - Yes  Vaping Use   Vaping Use: Never used  Substance and Sexual Activity   Alcohol use: No   Drug use: No   Sexual activity: Yes  Other Topics Concern   Not on file  Social History Narrative   She lives with husband.     Highest level of education: high school   She continues to work in their  own [place] Strain: Low Risk    Difficulty of Paying Living Expenses: Not  hard at all  Food Insecurity: Not on file  Transportation Needs: Not on file  Physical Activity: Inactive   Days of Exercise per Week: 0 days   Minutes of Exercise per Session: 0 min  Stress: No Stress Concern Present   Feeling of Stress : Not at all  Social Connections: Socially Integrated   Frequency of Communication with Friends and Family: More than three times a week   Frequency of Social Gatherings with Friends and Family: More than three times a week   Attends Religious Services: More than 4 times per year   Active Member of [employer] or Organizations: Yes   Attends Music therapist: More than 4 times per year   Marital Status: Married     Family History: The patient's family history includes Anemia in her paternal grandfather; Breast cancer (age of onset: 42) in her cousin; Breast cancer (age of onset: 81) in her cousin; CAD in her father; Cancer in her paternal uncle; Colon cancer (age of onset: 4) in her cousin; Colon cancer (age of onset: 71) in her cousin; Colon polyps in her brother and father; Dementia in her father; Pancreatic cancer (age of onset: 67) in her brother; Stroke in her mother and another family member. There is no history of Esophageal cancer or Stomach cancer. ROS:   Please see the history of present illness.    All other systems reviewed and are negative.  EKGs/Labs/Other Studies Reviewed:    The following studies were reviewed today:    Recent Labs: 10/27/2020: TSH 2.070 11/07/2020: ALT 10; BUN 17; Creatinine, Ser 0.88; Hemoglobin 14.1; Magnesium 2.0; Platelets 178.0; Potassium 3.8; Sodium 142  Recent Lipid Panel    Component Value Date/Time   CHOL 219 (H) 10/27/2020 0050   TRIG 78 10/27/2020 0050   HDL 48 10/27/2020 0050   CHOLHDL 4.6 10/27/2020 0050   VLDL 16 10/27/2020 0050   LDLCALC 155 (H)  10/27/2020 0050   LDLDIRECT 179.7 02/10/2012 0920    Physical Exam:    VS:  BP 136/84 (BP Location: Left Arm, Patient Position: Sitting, Cuff Size: Normal)   Pulse 70   Ht 5\' 5"  (1.651 m)   Wt 184 lb 12.8 oz (83.8 kg)   SpO2 98%   BMI 30.75 kg/m     Wt Readings from Last 3 Encounters:  12/07/20 184 lb 12.8 oz (83.8 kg)  11/07/20 178 lb (80.7 kg)  11/03/20 182 lb (82.6 kg)     GEN:  Well nourished, well developed in no acute distress HEENT: Normal NECK: No JVD; No carotid bruits LYMPHATICS: No lymphadenopathy CARDIAC: RRR, no murmurs, rubs, gallops RESPIRATORY:  Clear to auscultation without rales, wheezing or rhonchi  ABDOMEN: Soft, non-tender, non-distended MUSCULOSKELETAL:  No edema; No deformity  SKIN: Warm and dry NEUROLOGIC:  Alert and oriented x 3 PSYCHIATRIC:  Normal affect    Signed, Shirlee More, MD  12/07/2020 10:29 AM    [facility]

## 2020-12-08 ENCOUNTER — Telehealth: Payer: Self-pay | Admitting: Cardiology

## 2020-12-08 LAB — COMPREHENSIVE METABOLIC PANEL
ALT: 16 IU/L (ref 0–32)
AST: 23 IU/L (ref 0–40)
Albumin/Globulin Ratio: 2.1 (ref 1.2–2.2)
Albumin: 4.9 g/dL — ABNORMAL HIGH (ref 3.7–4.7)
Alkaline Phosphatase: 81 IU/L (ref 44–121)
BUN/Creatinine Ratio: 11 — ABNORMAL LOW (ref 12–28)
BUN: 10 mg/dL (ref 8–27)
Bilirubin Total: 0.3 mg/dL (ref 0.0–1.2)
CO2: 24 mmol/L (ref 20–29)
Calcium: 9.9 mg/dL (ref 8.7–10.3)
Chloride: 100 mmol/L (ref 96–106)
Creatinine, Ser: 0.87 mg/dL (ref 0.57–1.00)
Globulin, Total: 2.3 g/dL (ref 1.5–4.5)
Glucose: 97 mg/dL (ref 65–99)
Potassium: 4.1 mmol/L (ref 3.5–5.2)
Sodium: 142 mmol/L (ref 134–144)
Total Protein: 7.2 g/dL (ref 6.0–8.5)
eGFR: 70 mL/min/{1.73_m2} (ref >59)

## 2020-12-08 LAB — LIPID PANEL
Chol/HDL Ratio: 3.1 ratio (ref 0.0–4.4)
Cholesterol, Total: 173 mg/dL (ref 100–199)
HDL: 55 mg/dL (ref >39)
LDL Chol Calc (NIH): 100 mg/dL — ABNORMAL HIGH (ref 0–99)
Triglycerides: 101 mg/dL (ref 0–149)
VLDL Cholesterol Cal: 18 mg/dL (ref 5–40)

## 2020-12-08 NOTE — Telephone Encounter (Signed)
Spoke with patient, see chart.    

## 2020-12-08 NOTE — Telephone Encounter (Signed)
Pt is returning call from earlier this morning in regards to her results. Please advise pt further

## 2020-12-12 ENCOUNTER — Other Ambulatory Visit (HOSPITAL_COMMUNITY)
Admission: RE | Admit: 2020-12-12 | Discharge: 2020-12-12 | Disposition: A | Discharge status: Home / Self Care | Payer: Medicare HMO | Source: Ambulatory Visit | Attending: Emergency Medicine | Admitting: Emergency Medicine

## 2020-12-12 DIAGNOSIS — Z20822 Contact with and (suspected) exposure to covid-19: Secondary | ICD-10-CM | POA: Insufficient documentation

## 2020-12-12 DIAGNOSIS — Z01812 Encounter for preprocedural laboratory examination: Secondary | ICD-10-CM | POA: Insufficient documentation

## 2020-12-12 LAB — SARS CORONAVIRUS 2 (TAT 6-24 HRS): SARS Coronavirus 2: NEGATIVE

## 2020-12-15 ENCOUNTER — Other Ambulatory Visit: Payer: Self-pay

## 2020-12-15 ENCOUNTER — Ambulatory Visit: Payer: Medicare HMO | Admitting: Emergency Medicine

## 2020-12-15 ENCOUNTER — Ambulatory Visit (INDEPENDENT_AMBULATORY_CARE_PROVIDER_SITE_OTHER): Payer: Medicare HMO | Admitting: Emergency Medicine

## 2020-12-15 ENCOUNTER — Encounter: Payer: Self-pay | Admitting: Emergency Medicine

## 2020-12-15 ENCOUNTER — Other Ambulatory Visit: Payer: Self-pay | Admitting: Emergency Medicine

## 2020-12-15 DIAGNOSIS — R053 Chronic cough: Secondary | ICD-10-CM

## 2020-12-15 LAB — PULMONARY FUNCTION TEST
DL/VA % pred: 134 %
DL/VA: 5.42 ml/min/mmHg/L
DLCO cor % pred: 130 %
DLCO cor: 27.69 ml/min/mmHg
DLCO unc % pred: 133 %
DLCO unc: 28.27 ml/min/mmHg
FEF 25-75 Post: 1.65 L/sec
FEF 25-75 Pre: 2.09 L/sec
FEF2575-%Change-Post: -20 %
FEF2575-%Pred-Post: 87 %
FEF2575-%Pred-Pre: 110 %
FEV1-%Change-Post: -5 %
FEV1-%Pred-Post: 73 %
FEV1-%Pred-Pre: 77 %
FEV1-Post: 1.78 L
FEV1-Pre: 1.89 L
FEV1FVC-%Change-Post: -2 %
FEV1FVC-%Pred-Pre: 111 %
FEV6-%Change-Post: -3 %
FEV6-%Pred-Post: 70 %
FEV6-%Pred-Pre: 73 %
FEV6-Post: 2.18 L
FEV6-Pre: 2.26 L
FEV6FVC-%Change-Post: 0 %
FEV6FVC-%Pred-Post: 104 %
FEV6FVC-%Pred-Pre: 104 %
FVC-%Change-Post: -3 %
FVC-%Pred-Post: 67 %
FVC-%Pred-Pre: 70 %
FVC-Post: 2.18 L
FVC-Pre: 2.26 L
Post FEV1/FVC ratio: 82 %
Post FEV6/FVC ratio: 100 %
Pre FEV1/FVC ratio: 84 %
Pre FEV6/FVC Ratio: 100 %
RV % pred: 81 %
RV: 1.96 L
TLC % pred: 83 %
TLC: 4.57 L

## 2020-12-15 NOTE — Patient Instructions (Addendum)
We reviewed your pulmonary function testing today.  There is no clear evidence for asthma or COPD.  I do not believe we need to start an inhaler medication at this time. Try decreasing your pantoprazole (Protonix) to 1 time daily for a few weeks.  If your cough remains stable (no worsening) at that time then you can try coming off the medication altogether to see if you tolerate. Continue your Singulair and loratadine as you have been taking them.  Also continue the fluticasone nasal spray 2 sprays each nostril once daily.  If your cough remains stable then you can consider peeling off these medications one at a time.  If you have recurrent symptoms after you stop one of your allergy medications then you will need to go back on it. Follow Dr. Lamonte Sakai if needed

## 2020-12-15 NOTE — Progress Notes (Signed)
PFT done today. 

## 2020-12-15 NOTE — Progress Notes (Signed)
Subjective:    Patient ID: [name], female    DOB: Aug 06, 1945, 75 y.o.   MRN: [id number]  HPI 75 year old never smoker with a history of bilateral breast cancer, GERD with a hiatal hernia post Nissen, melanoma, chronic back pain.  She is here to evaluate cough.  She is been seen in 2018 at which time it was speculated that she may need to increase her rhinitis and GERD therapy: Chlorpheniramine and Dymista were started, PPI increased. She describes a globus sensation that happens intermittently, leads to paroxysms of cough. She does not feel any GERD but is treated for it. She has intermittent nasal congestion. The cough seems to have become active again about 3 months - unclear what started it. Seems to be worse when it is warm. She is seeing cardiology because she has been experiencing some chest pressure, can happen at rest, and at random.   For last 2 months managed on Singulair, uses fluticasone nasal spray as needed, Protonix 40 mg twice daily  She has had some dysphagia and underwent barium swallow/esophagram 10/12/2020 that showed normal esophageal mucosa, impaired peristalsis, postoperative changes from her Nissen.  No evidence of recurrent hiatal hernia, mass, stricture or ring.  The barium tablet transited easily into the stomach  CT-PA 10/02/2020 reviewed by me, shows no evidence of PE, a patulous esophagus (stable), patchy atelectasis with some mosaic changes without any evidence of septal thickening of unclear significance.  Question consistent with air trapping  Chest x-ray 10/26/2020 reviewed by me, showed no infiltrates, normal appearance.  ROV 12/15/20 --this is a follow-up visit for 75 year old woman, never smoker with GERD/Nissen, for chronic cough.  At her last visit we continued Protonix twice daily and Singulair, started fluticasone and loratadine.  I treated her with 5 days of prednisone. Her cough is better. She does still have some minimal throat clearing.  Isn't feeling  any GERD sx. Her congestion is better.   Pulmonary function testing performed today and reviewed by me, shows evidence for possible restriction on spirometry, consider some superimposed obstruction.  Lung volumes are normal.  Elevated diffusion capacity.  There was no bronchodilator response.   Review of Systems As per HPI  Past Medical History:  Diagnosis Date   Ankle effusion, left 03/23/2020   Arthritis    "spine" (07/28/2014)   Blood dyscrasia    bruises and bleed easily   Breast cancer, left breast ([place]) 07/28/2014   Breast cancer, right breast ([place]) [place], ABNORMAL 01/04/2008   Qualifier: Diagnosis of  By: Plotnikov MD, Evie Lacks    Chronic back pain    "all over"   Chronic cough 12/07/2007   Could be related to GERD - relapsed    COAGULOPATHY 07/12/2008   Qualifier: Diagnosis of  By: Nils Pyle CMA (AAMA), Leisha     Complication of anesthesia    went to sleep easily but hard to wake up up until elbow OR in 2010   CVA (cerebral vascular accident) ([place]) 10/26/2020   DDD (degenerative disc disease), cervical 07/09/2017   DDD (degenerative disc disease), lumbar 07/09/2017   With spinal stenosis.   Dr. Trenton Gammon   Depression    Dizziness 10/27/2020   Dyspnea    Normal Spirometry 03/2008 EF 65% BNP normal 11/2007   Episode of recurrent major depressive disorder ([place]) 12/05/2017   Essential hypertension 02/27/2007   Mild Off meds 2013  BP Readings from Last 3 Encounters:  02/10/12 128/90  08/09/11 134/62  03/06/11 150/86  Family history of breast cancer    Family history of colon cancer    Family history of pancreatic cancer    Fibromyalgia 02/27/2007   Chronic    Gastric polyps    GERD 02/27/2007   S/p Nissen's 2010    GERD (gastroesophageal reflux disease)    History of chicken pox    History of hiatal hernia    Hx of adenomatous polyp of colon 07/03/2015   Kidney stone    right kidney    Lichen sclerosus    Malignant neoplasm of upper-outer quadrant of left breast in  female, estrogen receptor positive ([place]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   Medicare annual wellness visit, subsequent 06/30/2016   Melanoma ([place]) 2010   "right elbow; [facility] at [facility]"   Melanoma of upper arm ([place]) 02/10/2012   2010 R  Elbow ([facility])   Mild anxiety    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   Peripheral edema 11/26/2016   Peripheral neuropathy 01/01/2013   Primary osteoarthritis of both hands 07/09/2017   Primary osteoarthritis of both knees 07/09/2017   Chondromalacia patella   Rheumatoid arthritis ([place]) 11/02/2018   S/P laparoscopic fundoplication [id number]   Steatosis of liver 12/07/2009   Qualifier: Diagnosis of  By: Surface RN, Butch Penny     Vitamin B12 deficiency    VITAMIN B12 DEFICIENCY 02/27/2007   Chronic    Vitamin D deficiency      Family History  Problem Relation Age of Onset   Colon cancer Cousin 22       double first cousin   Colon cancer Cousin 38       double first cousin   Colon polyps Father        between 22-20   Dementia Father    CAD Father        CABG at age 61   Stroke Mother    Pancreatic cancer Brother 68   Colon polyps Brother        between 71-20   Cancer Paternal Uncle        NOS   Anemia Paternal Grandfather        pernicious anemia   Breast cancer Cousin 34       double first cousin   Breast cancer Cousin 9       paternal cousin   Stroke Other        F 1st degree relative 44, M 1st degree relative   Esophageal cancer Neg Hx    Stomach cancer Neg Hx      Social History   Socioeconomic History   Marital status: Married    Spouse name: Not on file   Number of children: 2   Years of education: Not on file   Highest education level: Not on file  Occupational History   Occupation: Freight forwarder  Tobacco Use   Smoking status: Never   Smokeless tobacco: Never   Tobacco comments:    Regular Exercise - Yes  Vaping Use   Vaping Use: Never used  Substance and Sexual Activity   Alcohol use: No   Drug use: No   Sexual activity: Yes  Other Topics  Concern   Not on file  Social History Narrative   She lives with husband.     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 [place] Strain: Low Risk    Difficulty of Paying Living Expenses: Not hard at all  Food Insecurity: Not on file  Transportation Needs: Not on file  Physical Activity: Inactive   Days of Exercise per Week: 0 days   Minutes of Exercise per Session: 0 min  Stress: No Stress Concern Present   Feeling of Stress : Not at all  Social Connections: Socially Integrated   Frequency of Communication with Friends and Family: More than three times a week   Frequency of Social Gatherings with Friends and Family: More than three times a week   Attends Religious Services: More than 4 times per year   Active Member of [employer] or Organizations: Yes   Attends Music therapist: More than 4 times per year   Marital Status: Married  Human resources officer Violence: Not At Risk   Fear of Current or Ex-Partner: No   Emotionally Abused: No   Physically Abused: No   Sexually Abused: No    [place] farm, exposed to horses, llamas, cats Has run a storage facility, no recent exposures but has had a mold exposure in the past  Allergies  Allergen Reactions   Other Anaphylaxis and Swelling    Glue (eye lash glue, gorilla glue)   Silodosin Rash    Facial rash   Ace Inhibitors Other (See Comments)    REACTION: angioedema   Buprenorphine Hcl Other (See Comments)    "crazy"   Morphine And Related Other (See Comments)    "crazy" "crazy"   Codeine Rash   Sulfonamide Derivatives Rash   Telmisartan Rash     Outpatient Medications Prior to Visit  Medication Sig Dispense Refill   amLODipine (NORVASC) 5 MG tablet Take 1 tablet (5 mg total) by mouth daily. 30 tablet 11   aspirin EC 81 MG tablet Take 1 tablet (81 mg total) by mouth daily. Swallow whole. 150 tablet 2   benzonatate  (TESSALON) 200 MG capsule Take 1 capsule (200 mg total) by mouth 3 (three) times daily as needed for cough. 30 capsule 1   Cholecalciferol (VITAMIN D3 PO) Take 1,000 Units by mouth daily.     citalopram (CELEXA) 20 MG tablet Take 1 tablet (20 mg total) by mouth daily. 90 tablet 1   cyanocobalamin 2000 MCG tablet Take 1.5 tablets (3,000 mcg total) by mouth daily. 90 tablet    fluticasone (FLONASE) 50 MCG/ACT nasal spray Place 2 sprays into both nostrils as needed for allergies or rhinitis.     furosemide (LASIX) 40 MG tablet Take 1 tablet (40 mg total) by mouth daily. 90 tablet 1   isosorbide mononitrate (IMDUR) 30 MG 24 hr tablet Take 1 tablet (30 mg total) by mouth daily. 90 tablet 3   metoprolol succinate (TOPROL XL) 25 MG 24 hr tablet Take 1 tablet (25 mg total) by mouth daily. 90 tablet 3   montelukast (SINGULAIR) 10 MG tablet Take 1 tablet by mouth daily.     nitroGLYCERIN (NITROSTAT) 0.4 MG SL tablet Place 0.4 mg under the tongue every 5 (five) minutes as needed for chest pain.     pantoprazole (PROTONIX) 40 MG tablet Take 1 tablet (40 mg total) by mouth 2 (two) times daily. 60 tablet 8   rosuvastatin (CRESTOR) 10 MG tablet Take 1 tablet (10 mg total) by mouth daily. 90 tablet 3   valACYclovir (VALTREX) 1000 MG tablet Take 1,000 mg by mouth 3 (three) times daily.     No facility-administered medications prior to visit.         Objective:   Physical Exam Vitals:  12/15/20 1339  BP: 120/80  Pulse: 64  Temp: 98.3 F (36.8 C)  TempSrc: Oral  SpO2: 98%  Weight: 183 lb (83 kg)  Height: '5\' 7"'$  (1.702 m)   Gen: Pleasant, well-nourished, in no distress,  normal affect, frequent throat clearing  ENT: No lesions,  mouth clear,  oropharynx clear, no postnasal drip, intermittent hoarse voice  Neck: No JVD, no stridor  Lungs: No use of accessory muscles, no crackles or wheezing on normal respiration, no wheeze on forced expiration  Cardiovascular: RRR, heart sounds normal, no murmur  or gallops, no peripheral edema  Musculoskeletal: No deformities, no cyanosis or clubbing  Neuro: alert, awake, non focal  Skin: Warm, no lesions or rash     Assessment & Plan:  Chronic cough On good therapy for GERD and allergic rhinitis.  Her cough is better.  I do not see any evidence for obstructive disease on her PFT.  She is interested in trying to peel maintenance medications off if possible.  We will start with the pantoprazole.  I did explain that its not clear to me whether she will be able to come off of all her medications and that she needs to restart if her cough flares.  We reviewed your pulmonary function testing today.  There is no clear evidence for asthma or COPD.  I do not believe we need to start an inhaler medication at this time. Try decreasing your pantoprazole (Protonix) to 1 time daily for a few weeks.  If your cough remains stable (no worsening) at that time then you can try coming off the medication altogether to see if you tolerate. Continue your Singulair and loratadine as you have been taking them.  Also continue the fluticasone nasal spray 2 sprays each nostril once daily.  If your cough remains stable then you can consider peeling off these medications one at a time.  If you have recurrent symptoms after you stop one of your allergy medications then you will need to go back on it. Follow Dr. Lamonte Sakai if needed  Baltazar Apo, MD, PhD 12/15/2020, 2:03 PM [facility] [phone] or if no answer before 7:00PM call [phone] For any issues after 7:00PM please call eLink [phone]

## 2020-12-15 NOTE — Assessment & Plan Note (Signed)
On good therapy for GERD and allergic rhinitis.  Her cough is better.  I do not see any evidence for obstructive disease on her PFT.  She is interested in trying to peel maintenance medications off if possible.  We will start with the pantoprazole.  I did explain that its not clear to me whether she will be able to come off of all her medications and that she needs to restart if her cough flares.  We reviewed your pulmonary function testing today.  There is no clear evidence for asthma or COPD.  I do not believe we need to start an inhaler medication at this time. Try decreasing your pantoprazole (Protonix) to 1 time daily for a few weeks.  If your cough remains stable (no worsening) at that time then you can try coming off the medication altogether to see if you tolerate. Continue your Singulair and loratadine as you have been taking them.  Also continue the fluticasone nasal spray 2 sprays each nostril once daily.  If your cough remains stable then you can consider peeling off these medications one at a time.  If you have recurrent symptoms after you stop one of your allergy medications then you will need to go back on it. Follow Dr. Lamonte Sakai if needed

## 2021-01-02 ENCOUNTER — Ambulatory Visit (AMBULATORY_SURGERY_CENTER): Payer: Medicare HMO | Admitting: Internal Medicine

## 2021-01-02 ENCOUNTER — Encounter: Payer: Self-pay | Admitting: Internal Medicine

## 2021-01-02 ENCOUNTER — Other Ambulatory Visit: Payer: Self-pay

## 2021-01-02 VITALS — BP 146/58 | HR 64 | Temp 96.8°F | Resp 11 | Ht 65.0 in | Wt 184.0 lb

## 2021-01-02 DIAGNOSIS — D123 Benign neoplasm of transverse colon: Secondary | ICD-10-CM | POA: Diagnosis not present

## 2021-01-02 DIAGNOSIS — Z86010 Personal history of colonic polyps: Secondary | ICD-10-CM | POA: Diagnosis not present

## 2021-01-02 DIAGNOSIS — K219 Gastro-esophageal reflux disease without esophagitis: Secondary | ICD-10-CM

## 2021-01-02 DIAGNOSIS — D120 Benign neoplasm of cecum: Secondary | ICD-10-CM

## 2021-01-02 MED ORDER — SODIUM CHLORIDE 0.9 % IV SOLN
500.0000 mL | Freq: Once | INTRAVENOUS | Status: DC
Start: 2021-01-02 — End: 2021-01-02

## 2021-01-02 NOTE — Progress Notes (Signed)
Medical history reviewed with no changes noted. VS assessed by A.S 

## 2021-01-02 NOTE — Progress Notes (Signed)
Called to room to assist during endoscopic procedure.  Patient ID and intended procedure confirmed with present staff. Received instructions for my participation in the procedure from the performing physician.  

## 2021-01-02 NOTE — Patient Instructions (Addendum)
I found and removed 2 tiny polyps. I feel certain they are benign but they will be analyzed. I am inclined to discussing checking you again in 5 years - typically would stop with next exam at 25 but with your personal and family history we should discuss then.  I appreciate the opportunity to care for you. Gatha Mayer, MD, [facility]  Handout on polyps and diverticulosis given.  YOU HAD AN ENDOSCOPIC PROCEDURE TODAY AT [facility]:   Refer to the procedure report that was given to you for any specific questions about what was found during the examination.  If the procedure report does not answer your questions, please call your gastroenterologist to clarify.  If you requested that your care partner not be given the details of your procedure findings, then the procedure report has been included in a sealed envelope for you to review at your convenience later.  YOU SHOULD EXPECT: Some feelings of bloating in the abdomen. Passage of more gas than usual.  Walking can help get rid of the air that was put into your GI tract during the procedure and reduce the bloating. If you had a lower endoscopy (such as a colonoscopy or flexible sigmoidoscopy) you may notice spotting of blood in your stool or on the toilet paper. If you underwent a bowel prep for your procedure, you may not have a normal bowel movement for a few days.  Please Note:  You might notice some irritation and congestion in your nose or some drainage.  This is from the oxygen used during your procedure.  There is no need for concern and it should clear up in a day or so.  SYMPTOMS TO REPORT IMMEDIATELY:  Following lower endoscopy (colonoscopy or flexible sigmoidoscopy):  Excessive amounts of blood in the stool  Significant tenderness or worsening of abdominal pains  Swelling of the abdomen that is new, acute  Fever of 100F or higher   For urgent or emergent issues, a gastroenterologist can be reached at any hour by  calling [phone]. Do not use MyChart messaging for urgent concerns.    DIET:  We do recommend a small meal at first, but then you may proceed to your regular diet.  Drink plenty of fluids but you should avoid alcoholic beverages for 24 hours.  ACTIVITY:  You should plan to take it easy for the rest of today and you should NOT DRIVE or use heavy machinery until tomorrow (because of the sedation medicines used during the test).    FOLLOW UP: Our staff will call the number listed on your records 48-72 hours following your procedure to check on you and address any questions or concerns that you may have regarding the information given to you following your procedure. If we do not reach you, we will leave a message.  We will attempt to reach you two times.  During this call, we will ask if you have developed any symptoms of COVID 19. If you develop any symptoms (ie: fever, flu-like symptoms, shortness of breath, cough etc.) before then, please call [phone].  If you test positive for Covid 19 in the 2 weeks post procedure, please call and report this information to [place].    If any biopsies were taken you will be contacted by phone or by letter within the next 1-3 weeks.  Please call us at [phone] if you have not heard about the biopsies in 3 weeks.    SIGNATURES/CONFIDENTIALITY: You and/or your care  partner have signed paperwork which will be entered into your electronic medical record.  These signatures attest to the fact that that the information above on your After Visit Summary has been reviewed and is understood.  Full responsibility of the confidentiality of this discharge information lies with you and/or your care-partner.  

## 2021-01-02 NOTE — Progress Notes (Signed)
VSS NAD Pt awake transferred tp PACU RN

## 2021-01-02 NOTE — Op Note (Signed)
[facility] Patient Name: [name] Procedure Date: 01/02/2021 9:00 AM MRN: [id number] Endoscopist: Gatha Mayer , MD Age: 75 Referring MD:  Date of Birth: 04-26-1946 Gender: Female Account #: [id number] Procedure:                Colonoscopy Indications:              Surveillance: Personal history of adenomatous                            polyps on last colonoscopy 5 years ago Medicines:                Propofol per Anesthesia, Monitored Anesthesia Care Procedure:                Pre-Anesthesia Assessment:                           - Prior to the procedure, a History and Physical                            was performed, and patient medications and                            allergies were reviewed. The patient's tolerance of                            previous anesthesia was also reviewed. The risks                            and benefits of the procedure and the sedation                            options and risks were discussed with the patient.                            All questions were answered, and informed consent                            was obtained. Prior Anticoagulants: The patient has                            taken no previous anticoagulant or antiplatelet                            agents. ASA Grade Assessment: III - A patient with                            severe systemic disease. After reviewing the risks                            and benefits, the patient was deemed in                            satisfactory condition to undergo the procedure.  After obtaining informed consent, the colonoscope                            was passed under direct vision. Throughout the                            procedure, the patient's blood pressure, pulse, and                            oxygen saturations were monitored continuously. The                            Olympus CF-HQ190L Colonoscope was introduced                             through the anus and advanced to the the cecum,                            identified by appendiceal orifice and ileocecal                            valve. The colonoscopy was performed without                            difficulty. The patient tolerated the procedure                            well. The quality of the bowel preparation was                            good. The bowel preparation used was Miralax via                            split dose instruction. The ileocecal valve,                            appendiceal orifice, and rectum were photographed. Scope In: 9:17:28 AM Scope Out: 9:33:06 AM Scope Withdrawal Time: 0 hours 10 minutes 29 seconds  Total Procedure Duration: 0 hours 15 minutes 38 seconds  Findings:                 The perianal and digital rectal examinations were                            normal.                           A diminutive polyp was found in the cecum. The                            polyp was sessile. The polyp was removed with a                            cold snare. Resection and retrieval were complete.  Verification of patient identification for the                            specimen was done. Estimated blood loss was minimal.                           A 2 mm polyp was found in the transverse colon. The                            polyp was sessile. The polyp was removed with a                            cold biopsy forceps. Resection and retrieval were                            complete. Verification of patient identification                            for the specimen was done. Estimated blood loss was                            minimal.                           Multiple diverticula were found in the sigmoid                            colon, descending colon and transverse colon.                           The exam was otherwise without abnormality on                            direct and retroflexion  views. Complications:            No immediate complications. Estimated Blood Loss:     Estimated blood loss was minimal. Impression:               - One diminutive polyp in the cecum, removed with a                            cold snare. Resected and retrieved.                           - One 2 mm polyp in the transverse colon, removed                            with a cold biopsy forceps. Resected and retrieved.                           - Diverticulosis in the sigmoid colon, in the                            descending colon and in the transverse colon.                           -  The examination was otherwise normal on direct                            and retroflexion views.                           - Personal history of colonic polyps, Family                            history of colon cancer (multiple) and personal                            history of MUTYH mutation. Recommendation:           - Patient has a contact number available for                            emergencies. The signs and symptoms of potential                            delayed complications were discussed with the                            patient. Return to normal activities tomorrow.                            Written discharge instructions were provided to the                            patient.                           - Resume previous diet.                           - Continue present medications.                           - Repeat colonoscopy in 5 years for surveillance. I                            think worth discussing given her personal and                            family hx Gatha Mayer, MD 01/02/2021 9:46:45 AM This report has been signed electronically.

## 2021-01-02 NOTE — Progress Notes (Signed)
Patient ID: [name], female   DOB: 1946/02/27, 75 y.o.   MRN: [id number]  [facility] History and Physical   Primary Care Physician:  Marrian Salvage, [place]   Reason for Procedure:   Hx colon polyps and Family hx crca  Plan:    colonoscopy    HPI: [name] is a 75 y.o. female here for colonoscopy - no sig dyspnea, no angina    Past Medical History:  Diagnosis Date   Allergy    Ankle effusion, left 03/23/2020   Arthritis    "spine" (07/28/2014)   Blood dyscrasia    bruises and bleed easily   Breast cancer, left breast ([place]) 07/28/2014   Breast cancer, right breast ([place]) [place], ABNORMAL 01/04/2008   Qualifier: Diagnosis of  By: Plotnikov MD, Evie Lacks    Chronic back pain    "all over"   Chronic cough 12/07/2007   Could be related to GERD - relapsed    COAGULOPATHY 07/12/2008   Qualifier: Diagnosis of  By: Nils Pyle CMA (AAMA), Leisha     Complication of anesthesia    went to sleep easily but hard to wake up up until elbow OR in 2010   CVA (cerebral vascular accident) ([place]) 10/26/2020   DDD (degenerative disc disease), cervical 07/09/2017   DDD (degenerative disc disease), lumbar 07/09/2017   With spinal stenosis.   Dr. Trenton Gammon   Depression    Dizziness 10/27/2020   Dyspnea    Normal Spirometry 03/2008 EF 65% BNP normal 11/2007   Episode of recurrent major depressive disorder ([place]) 12/05/2017   Essential hypertension 02/27/2007   Mild Off meds 2013  BP Readings from Last 3 Encounters:  02/10/12 128/90  08/09/11 134/62  03/06/11 150/86      Family history of breast cancer    Family history of colon cancer    Family history of pancreatic cancer    Fibromyalgia 02/27/2007   Chronic    Gastric polyps    GERD 02/27/2007   S/p Nissen's 2010    GERD (gastroesophageal reflux disease)    History of chicken pox    History of hiatal hernia    Hx of adenomatous polyp of colon 07/03/2015   Kidney stone    right kidney    Lichen sclerosus     Malignant neoplasm of upper-outer quadrant of left breast in female, estrogen receptor positive ([place]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   Medicare annual wellness visit, subsequent 06/30/2016   Melanoma ([place]) 2010   "right elbow; [facility] at [facility]"   Melanoma of upper arm ([place]) 02/10/2012   2010 R  Elbow ([facility])   Mild anxiety    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   Peripheral edema 11/26/2016   Peripheral neuropathy 01/01/2013   Primary osteoarthritis of both hands 07/09/2017   Primary osteoarthritis of both knees 07/09/2017   Chondromalacia patella   Rheumatoid arthritis ([place]) 11/02/2018   S/P laparoscopic fundoplication [id number]   Steatosis of liver 12/07/2009   Qualifier: Diagnosis of  By: Surface RN, Butch Penny     Vitamin B12 deficiency    VITAMIN B12 DEFICIENCY 02/27/2007   Chronic    Vitamin D deficiency     Past Surgical History:  Procedure Laterality Date   BREAST BIOPSY Left 04/2014   BREAST RECONSTRUCTION WITH PLACEMENT OF TISSUE EXPANDER AND FLEX HD (ACELLULAR HYDRATED DERMIS) Left 07/28/2014   Procedure: LEFT BREAST RECONSTRUCTION PLACEMENT OF LEFT TISSUE EXPANDER ;  Surgeon: Crissie Reese, MD;  Location: [place];  Service: Clinical cytogeneticist;  Laterality: Left;   BREAST RECONSTRUCTION WITH PLACEMENT OF TISSUE EXPANDER AND FLEX HD (ACELLULAR HYDRATED DERMIS) Left 09/08/2014   Procedure: REMOVAL OF TISSUE EXPANDER FROM LEFT BREAST;  Surgeon: Crissie Reese, MD;  Location: [place];  Service: Plastics;  Laterality: Left;   BUNIONECTOMY Bilateral 1970's   COLONOSCOPY     Dr Sharlett Iles   CYSTOSCOPY WITH RETROGRADE PYELOGRAM, URETEROSCOPY AND STENT PLACEMENT Right 06/09/2015   Procedure: CYSTOSCOPY WITH RIGHT RETROGRADE PYELOGRAM, RIGHT URETEROSCOPY AND RIGHT URTERAL STENT PLACEMENT;  Surgeon: Ardis Hughs, MD;  [facility];  Service: Urology;  Laterality: Right;   ESOPHAGOGASTRODUODENOSCOPY     HERNIA REPAIR     HOLMIUM LASER APPLICATION Right [id number]   Procedure: HOLMIUM LASER APPLICATION;   Surgeon: Ardis Hughs, MD;  [facility];  Service: Urology;  Laterality: Right;   LATISSIMUS FLAP TO BREAST Left 09/08/2014   Procedure: LEFT LATISSIMUS FLAP TO BREAST WITH SALINE IMPLANT FOR BREAST RECONSTRUCTION;  Surgeon: Crissie Reese, MD;  Location: [place];  Service: Plastics;  Laterality: Left;   LUMBAR FUSION  01/2018   L5-S1 transitional segmental laminectomy and Fusion   MASTECTOMY Right 1996    chemotherapy. pt. states 13 lymph nodes were removed   MASTECTOMY COMPLETE / SIMPLE W/ SENTINEL NODE BIOPSY Left 07/28/2014   MASTECTOMY W/ SENTINEL NODE BIOPSY Left 07/28/2014   Procedure: LEFT MASTECTOMY WITH SENTINEL LYMPH NODE MAPPING;  Surgeon: Autumn Messing III, MD;  Location: [place];  Service: General;  Laterality: Left;   MELANOMA EXCISION Right 2010   From elbow-- Done at [place]  [id number]   RECONSTRUCTION BREAST IMMEDIATE / DELAYED W/ TISSUE EXPANDER Left 07/28/2014   TEMPOROMANDIBULAR JOINT SURGERY Bilateral 1987   TONSILLECTOMY      Prior to Admission medications   Medication Sig Start Date End Date Taking? Authorizing Provider  amLODipine (NORVASC) 5 MG tablet Take 1 tablet (5 mg total) by mouth daily. 10/27/20 10/27/21 Yes Pokhrel, Laxman, MD  aspirin EC 81 MG tablet Take 1 tablet (81 mg total) by mouth daily. Swallow whole. 10/27/20 10/27/21 Yes Pokhrel, Laxman, MD  citalopram (CELEXA) 20 MG tablet Take 1 tablet (20 mg total) by mouth daily. 11/09/20  Yes Marrian Salvage, FNP  furosemide (LASIX) 40 MG tablet Take 1 tablet (40 mg total) by mouth daily. 11/09/20  Yes Marrian Salvage, FNP  isosorbide mononitrate (IMDUR) 30 MG 24 hr tablet Take 1 tablet (30 mg total) by mouth daily. 12/07/20 03/07/21 Yes Richardo Priest, MD  metoprolol succinate (TOPROL XL) 25 MG 24 hr tablet Take 1 tablet (25 mg total) by mouth daily. 11/02/20  Yes Richardo Priest, MD  rosuvastatin (CRESTOR) 10 MG tablet Take 1 tablet (10 mg total) by mouth daily. 11/02/20 01/31/21 Yes Richardo Priest, MD  benzonatate (TESSALON) 200 MG capsule Take 1 capsule (200 mg total) by mouth 3 (three) times daily as needed for cough. Patient not taking: Reported on 01/02/2021 11/03/20   Collene Gobble, MD  Cholecalciferol (VITAMIN D3 PO) Take 1,000 Units by mouth daily. Patient not taking: Reported on 01/02/2021    [provider]  cyanocobalamin 2000 MCG tablet Take 1.5 tablets (3,000 mcg total) by mouth daily. Patient not taking: Reported on 01/02/2021 01/28/20   Brunetta Jeans, PA-C  fluticasone [facility]) 50 MCG/ACT nasal spray Place 2 sprays into both nostrils as needed for allergies or rhinitis. Patient not taking: Reported on 01/02/2021    [provider]  montelukast (SINGULAIR) 10 MG tablet Take 1 tablet by mouth daily. Patient not taking: Reported on 01/02/2021 10/19/20   [provider]  nitroGLYCERIN (NITROSTAT) 0.4 MG SL tablet Place 0.4 mg under the tongue every 5 (five) minutes as needed for chest pain. Patient not taking: Reported on 01/02/2021    [provider]  pantoprazole (PROTONIX) 40 MG tablet Take 1 tablet (40 mg total) by mouth 2 (two) times daily. Patient not taking: Reported on 01/02/2021 10/03/20   Esterwood, Amy S, PA-C  valACYclovir (VALTREX) 1000 MG tablet Take 1,000 mg by mouth 3 (three) times daily. Patient not taking: Reported on 01/02/2021 11/28/20   [provider]    Current Outpatient Medications  Medication Sig Dispense Refill   amLODipine (NORVASC) 5 MG tablet Take 1 tablet (5 mg total) by mouth daily. 30 tablet 11   aspirin EC 81 MG tablet Take 1 tablet (81 mg total) by mouth daily. Swallow whole. 150 tablet 2   citalopram (CELEXA) 20 MG tablet Take 1 tablet (20 mg total) by mouth daily. 90 tablet 1   furosemide (LASIX) 40 MG tablet Take 1 tablet (40 mg total) by mouth daily. 90 tablet 1   isosorbide mononitrate (IMDUR) 30 MG 24 hr tablet Take 1 tablet (30 mg total) by mouth daily. 90 tablet 3   metoprolol succinate (TOPROL  XL) 25 MG 24 hr tablet Take 1 tablet (25 mg total) by mouth daily. 90 tablet 3   rosuvastatin (CRESTOR) 10 MG tablet Take 1 tablet (10 mg total) by mouth daily. 90 tablet 3   benzonatate (TESSALON) 200 MG capsule Take 1 capsule (200 mg total) by mouth 3 (three) times daily as needed for cough. (Patient not taking: Reported on 01/02/2021) 30 capsule 1   Cholecalciferol (VITAMIN D3 PO) Take 1,000 Units by mouth daily. (Patient not taking: Reported on 01/02/2021)     cyanocobalamin 2000 MCG tablet Take 1.5 tablets (3,000 mcg total) by mouth daily. (Patient not taking: Reported on 01/02/2021) 90 tablet    fluticasone (FLONASE) 50 MCG/ACT nasal spray Place 2 sprays into both nostrils as needed for allergies or rhinitis. (Patient not taking: Reported on 01/02/2021)     montelukast (SINGULAIR) 10 MG tablet Take 1 tablet by mouth daily. (Patient not taking: Reported on 01/02/2021)     nitroGLYCERIN (NITROSTAT) 0.4 MG SL tablet Place 0.4 mg under the tongue every 5 (five) minutes as needed for chest pain. (Patient not taking: Reported on 01/02/2021)     pantoprazole (PROTONIX) 40 MG tablet Take 1 tablet (40 mg total) by mouth 2 (two) times daily. (Patient not taking: Reported on 01/02/2021) 60 tablet 8   valACYclovir (VALTREX) 1000 MG tablet Take 1,000 mg by mouth 3 (three) times daily. (Patient not taking: Reported on 01/02/2021)     Current Facility-Administered Medications  Medication Dose Route Frequency Provider Last Rate Last Admin   0.9 %  sodium chloride infusion  500 mL Intravenous Once Gatha Mayer, MD        Allergies as of 01/02/2021 - Review Complete 01/02/2021  Allergen Reaction Noted   Other Anaphylaxis and Swelling 02/22/2015   Silodosin Rash 04/06/2015   Ace inhibitors Other (See Comments) 03/16/2008   Buprenorphine hcl Other (See Comments) 04/06/2015   Morphine and related Other (See Comments) 07/28/2014   Codeine Rash    Sulfonamide derivatives Rash    Telmisartan Rash 04/14/2007    Family  History  Problem Relation Age of Onset   Colon cancer Cousin 72  double first cousin   Colon cancer Cousin 23       double first cousin   Colon polyps Father        between 46-20   Dementia Father    CAD Father        CABG at age 73   Stroke Mother    Pancreatic cancer Brother 34   Colon polyps Brother        between 85-20   Cancer Paternal Uncle        NOS   Anemia Paternal Grandfather        pernicious anemia   Breast cancer Cousin 54       double first cousin   Breast cancer Cousin 58       paternal cousin   Stroke Other        F 1st degree relative 78, M 1st degree relative   Esophageal cancer Neg Hx    Stomach cancer Neg Hx     Social History   Social History Narrative   She lives with husband.     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 [place]: All other review of systems negative except as mentioned in the HPI.  Physical Exam: Vital signs in last 24 hours: '@VSRANGES'$ @   General:   Alert,  Well-developed, well-nourished, pleasant and cooperative in NAD Lungs:  Clear throughout to auscultation.   Heart:  Regular rate and rhythm; no murmurs, clicks, rubs,  or gallops. Abdomen:  Soft, nontender and nondistended. Normal bowel sounds.   Neuro/Psych:  Alert and cooperative. Normal mood and affect. A and O x 3   '@Arlenis Blaydes'$  Simonne Maffucci, MD, [facility] [phone] (pager) 01/02/2021 9:12 AM@

## 2021-01-04 ENCOUNTER — Telehealth: Payer: Self-pay | Admitting: *Deleted

## 2021-01-04 NOTE — Telephone Encounter (Signed)
  Follow up Call-  Call back number 01/02/2021  Post procedure Call Back phone  # [phone]  Permission to leave phone message Yes  Some recent data might be hidden     Patient questions:  Do you have a fever, pain , or abdominal swelling? No. Pain Score  0 *  Have you tolerated food without any problems? Yes.    Have you been able to return to your normal activities? Yes.    Do you have any questions about your discharge instructions: Diet   No. Medications  No. Follow up visit  No.  Do you have questions or concerns about your Care? No.  Actions: * If pain score is 4 or above: No action needed, pain <4.  Have you developed a fever since your procedure? no  2.   Have you had an respiratory symptoms (SOB or cough) since your procedure? no  3.   Have you tested positive for COVID 19 since your procedure no  4.   Have you had any family members/close contacts diagnosed with the COVID 19 since your procedure?  no   If yes to any of these questions please route to Joylene John, RN and Joella Prince, RN

## 2021-01-09 DIAGNOSIS — M4326 Fusion of spine, lumbar region: Secondary | ICD-10-CM | POA: Diagnosis not present

## 2021-01-09 DIAGNOSIS — M4126 Other idiopathic scoliosis, lumbar region: Secondary | ICD-10-CM | POA: Diagnosis not present

## 2021-01-09 DIAGNOSIS — M4156 Other secondary scoliosis, lumbar region: Secondary | ICD-10-CM | POA: Diagnosis not present

## 2021-01-09 DIAGNOSIS — M4726 Other spondylosis with radiculopathy, lumbar region: Secondary | ICD-10-CM | POA: Diagnosis not present

## 2021-01-16 ENCOUNTER — Encounter: Payer: Self-pay | Admitting: Internal Medicine

## 2021-02-20 DIAGNOSIS — M4156 Other secondary scoliosis, lumbar region: Secondary | ICD-10-CM | POA: Diagnosis not present

## 2021-02-20 DIAGNOSIS — M5431 Sciatica, right side: Secondary | ICD-10-CM | POA: Diagnosis not present

## 2021-02-20 DIAGNOSIS — M961 Postlaminectomy syndrome, not elsewhere classified: Secondary | ICD-10-CM | POA: Diagnosis not present

## 2021-02-20 DIAGNOSIS — M4726 Other spondylosis with radiculopathy, lumbar region: Secondary | ICD-10-CM | POA: Diagnosis not present

## 2021-04-30 DIAGNOSIS — M48061 Spinal stenosis, lumbar region without neurogenic claudication: Secondary | ICD-10-CM | POA: Diagnosis not present

## 2021-04-30 DIAGNOSIS — M5136 Other intervertebral disc degeneration, lumbar region: Secondary | ICD-10-CM | POA: Diagnosis not present

## 2021-04-30 DIAGNOSIS — M545 Low back pain, unspecified: Secondary | ICD-10-CM | POA: Diagnosis not present

## 2021-04-30 DIAGNOSIS — Z981 Arthrodesis status: Secondary | ICD-10-CM | POA: Diagnosis not present

## 2021-04-30 DIAGNOSIS — S32018A Other fracture of first lumbar vertebra, initial encounter for closed fracture: Secondary | ICD-10-CM | POA: Diagnosis not present

## 2021-04-30 DIAGNOSIS — R202 Paresthesia of skin: Secondary | ICD-10-CM | POA: Diagnosis not present

## 2021-04-30 DIAGNOSIS — R200 Anesthesia of skin: Secondary | ICD-10-CM | POA: Diagnosis not present

## 2021-04-30 DIAGNOSIS — M4327 Fusion of spine, lumbosacral region: Secondary | ICD-10-CM | POA: Diagnosis not present

## 2021-05-01 ENCOUNTER — Encounter: Payer: Self-pay | Admitting: Family

## 2021-05-01 ENCOUNTER — Ambulatory Visit (INDEPENDENT_AMBULATORY_CARE_PROVIDER_SITE_OTHER): Payer: Medicare HMO | Admitting: Family

## 2021-05-01 VITALS — BP 160/84 | HR 64 | Temp 97.7°F | Ht 65.0 in | Wt 190.0 lb

## 2021-05-01 DIAGNOSIS — R300 Dysuria: Secondary | ICD-10-CM

## 2021-05-01 LAB — POCT URINALYSIS DIP (MANUAL ENTRY)
Blood, UA: NEGATIVE
Glucose, UA: NEGATIVE mg/dL
Leukocytes, UA: NEGATIVE
Nitrite, UA: NEGATIVE
Protein Ur, POC: NEGATIVE mg/dL
Spec Grav, UA: >=1.03 — AB (ref 1.010–1.025)
Urobilinogen, UA: 0.2 E.U./dL
pH, UA: 6 (ref 5.0–8.0)

## 2021-05-01 MED ORDER — NITROFURANTOIN MONOHYD MACRO 100 MG PO CAPS: 100.0000 mg | ORAL_CAPSULE | Freq: Two times a day (BID) | ORAL | 0 refills | Status: DC

## 2021-05-01 NOTE — Progress Notes (Signed)
[name] is a 75 y.o. female with the following history as recorded in EpicCare:  Patient Active Problem List   Diagnosis Date Noted   Lichen sclerosus 54/01/8118   Dizziness 10/27/2020   Vitamin B12 deficiency    Mild anxiety    Kidney stone    History of hiatal hernia    History of chicken pox    GERD (gastroesophageal reflux disease)    Gastric polyps    Dyspnea    Depression    Complication of anesthesia    Chronic back pain    Blood dyscrasia    Arthritis    Ankle effusion, left 03/23/2020   Rheumatoid arthritis ([place]) 11/02/2018   Episode of recurrent major depressive disorder ([place]) 12/05/2017   Primary osteoarthritis of both hands 07/09/2017   Primary osteoarthritis of both knees 07/09/2017   DDD (degenerative disc disease), cervical 07/09/2017   DDD (degenerative disc disease), lumbar 07/09/2017   Peripheral edema 11/26/2016   Medicare annual wellness visit, subsequent 06/30/2016   Hx of adenomatous polyp of colon 14/78/2956   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   Breast cancer, left breast ([place]) 07/28/2014   Family history of breast cancer    Family history of colon cancer    Family history of pancreatic cancer    Malignant neoplasm of upper-outer quadrant of left breast in female, estrogen receptor positive ([place]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   Peripheral neuropathy 01/01/2013   Melanoma of upper arm ([place]) 02/10/2012   S/P laparoscopic fundoplication 21/30/8657   Steatosis of liver 12/07/2009   COAGULOPATHY 07/12/2008   Melanoma ([place]) 2010   CHEST XRAY, ABNORMAL 01/04/2008   Chronic cough 12/07/2007   Vitamin D deficiency 04/14/2007   VITAMIN B12 DEFICIENCY 02/27/2007   Essential hypertension 02/27/2007   GERD 02/27/2007   Fibromyalgia 02/27/2007   Breast cancer, right breast ([place]) 1995    Current Outpatient Medications  Medication Sig Dispense Refill   amLODipine (NORVASC) 5 MG tablet Take 1 tablet (5 mg total) by mouth daily. 30 tablet 11   aspirin EC 81  MG tablet Take 1 tablet (81 mg total) by mouth daily. Swallow whole. 150 tablet 2   Cholecalciferol (VITAMIN D3 PO) Take 1,000 Units by mouth daily.     citalopram (CELEXA) 20 MG tablet Take 1 tablet (20 mg total) by mouth daily. 90 tablet 1   cyanocobalamin 2000 MCG tablet Take 1.5 tablets (3,000 mcg total) by mouth daily. 90 tablet    furosemide (LASIX) 40 MG tablet Take 1 tablet (40 mg total) by mouth daily. 90 tablet 1   metoprolol succinate (TOPROL XL) 25 MG 24 hr tablet Take 1 tablet (25 mg total) by mouth daily. 90 tablet 3   nitrofurantoin, macrocrystal-monohydrate, (MACROBID) 100 MG capsule Take 1 capsule (100 mg total) by mouth 2 (two) times daily. 14 capsule 0   nitroGLYCERIN (NITROSTAT) 0.4 MG SL tablet Place 0.4 mg under the tongue every 5 (five) minutes as needed for chest pain.     rosuvastatin (CRESTOR) 10 MG tablet Take 1 tablet (10 mg total) by mouth daily. 90 tablet 3   fluticasone (FLONASE) 50 MCG/ACT nasal spray Place 2 sprays into both nostrils as needed for allergies or rhinitis. (Patient not taking: Reported on 01/02/2021)     isosorbide mononitrate (IMDUR) 30 MG 24 hr tablet Take 1 tablet (30 mg total) by mouth daily. 90 tablet 3   No current facility-administered medications for this visit.    Allergies: Other, Silodosin, Ace inhibitors, Buprenorphine hcl, Morphine and  related, Codeine, Sulfonamide derivatives, and Telmisartan  Past Medical History:  Diagnosis Date   Allergy    Ankle effusion, left 03/23/2020   Arthritis    "spine" (07/28/2014)   Blood dyscrasia    bruises and bleed easily   Breast cancer, left breast ([place]) 07/28/2014   Breast cancer, right breast ([place]) [place], ABNORMAL 01/04/2008   Qualifier: Diagnosis of  By: Plotnikov MD, Evie Lacks    Chronic back pain    "all over"   Chronic cough 12/07/2007   Could be related to GERD - relapsed    COAGULOPATHY 07/12/2008   Qualifier: Diagnosis of  By: Nils Pyle CMA (AAMA), Leisha     Complication of  anesthesia    went to sleep easily but hard to wake up up until elbow OR in 2010   CVA (cerebral vascular accident) ([place]) 10/26/2020   DDD (degenerative disc disease), cervical 07/09/2017   DDD (degenerative disc disease), lumbar 07/09/2017   With spinal stenosis.   Dr. Trenton Gammon   Depression    Dizziness 10/27/2020   Dyspnea    Normal Spirometry 03/2008 EF 65% BNP normal 11/2007   Episode of recurrent major depressive disorder ([place]) 12/05/2017   Essential hypertension 02/27/2007   Mild Off meds 2013  BP Readings from Last 3 Encounters:  02/10/12 128/90  08/09/11 134/62  03/06/11 150/86      Family history of breast cancer    Family history of colon cancer    Family history of pancreatic cancer    Fibromyalgia 02/27/2007   Chronic    Gastric polyps    GERD 02/27/2007   S/p Nissen's 2010    GERD (gastroesophageal reflux disease)    History of chicken pox    History of hiatal hernia    Hx of adenomatous polyp of colon 07/03/2015   Kidney stone    right kidney    Lichen sclerosus    Malignant neoplasm of upper-outer quadrant of left breast in female, estrogen receptor positive ([place]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   Medicare annual wellness visit, subsequent 06/30/2016   Melanoma ([place]) 2010   "right elbow; [facility]"   Melanoma of upper arm ([place]) 02/10/2012   2010 R  Elbow ([facility])   Mild anxiety    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   Peripheral edema 11/26/2016   Peripheral neuropathy 01/01/2013   Primary osteoarthritis of both hands 07/09/2017   Primary osteoarthritis of both knees 07/09/2017   Chondromalacia patella   Rheumatoid arthritis ([place]) 11/02/2018   S/P laparoscopic fundoplication 62/56/3893   Steatosis of liver 12/07/2009   Qualifier: Diagnosis of  By: Surface RN, Butch Penny     Vitamin B12 deficiency    VITAMIN B12 DEFICIENCY 02/27/2007   Chronic    Vitamin D deficiency     Past Surgical History:  Procedure Laterality Date   BREAST BIOPSY Left 04/2014   BREAST  RECONSTRUCTION WITH PLACEMENT OF TISSUE EXPANDER AND FLEX HD (ACELLULAR HYDRATED DERMIS) Left 07/28/2014   Procedure: LEFT BREAST RECONSTRUCTION PLACEMENT OF LEFT TISSUE EXPANDER ;  Surgeon: Crissie Reese, MD;  Location: [place];  Service: Plastics;  Laterality: Left;   BREAST RECONSTRUCTION WITH PLACEMENT OF TISSUE EXPANDER AND FLEX HD (ACELLULAR HYDRATED DERMIS) Left 09/08/2014   Procedure: REMOVAL OF TISSUE EXPANDER FROM LEFT BREAST;  Surgeon: Crissie Reese, MD;  Location: [place];  Service: Plastics;  Laterality: Left;   BUNIONECTOMY Bilateral 1970's   COLONOSCOPY     Dr Sharlett Iles   CYSTOSCOPY WITH RETROGRADE PYELOGRAM, URETEROSCOPY  AND STENT PLACEMENT Right 06/09/2015   Procedure: CYSTOSCOPY WITH RIGHT RETROGRADE PYELOGRAM, RIGHT URETEROSCOPY AND RIGHT URTERAL STENT PLACEMENT;  Surgeon: Ardis Hughs, MD;  [facility];  Service: Urology;  Laterality: Right;   ESOPHAGOGASTRODUODENOSCOPY     HERNIA REPAIR     HOLMIUM LASER APPLICATION Right 9/37/9024   Procedure: HOLMIUM LASER APPLICATION;  Surgeon: Ardis Hughs, MD;  [facility];  Service: Urology;  Laterality: Right;   LATISSIMUS FLAP TO BREAST Left 09/08/2014   Procedure: LEFT LATISSIMUS FLAP TO BREAST WITH SALINE IMPLANT FOR BREAST RECONSTRUCTION;  Surgeon: Crissie Reese, MD;  Location: [place];  Service: Plastics;  Laterality: Left;   LUMBAR FUSION  01/2018   L5-S1 transitional segmental laminectomy and Fusion   MASTECTOMY Right 1996    chemotherapy. pt. states 13 lymph nodes were removed   MASTECTOMY COMPLETE / SIMPLE W/ SENTINEL NODE BIOPSY Left 07/28/2014   MASTECTOMY W/ SENTINEL NODE BIOPSY Left 07/28/2014   Procedure: LEFT MASTECTOMY WITH SENTINEL LYMPH NODE MAPPING;  Surgeon: Autumn Messing III, MD;  Location: [place];  Service: General;  Laterality: Left;   MELANOMA EXCISION Right 2010   From elbow-- Done at [place]  01/7352   RECONSTRUCTION BREAST IMMEDIATE / DELAYED W/ TISSUE EXPANDER Left 07/28/2014    TEMPOROMANDIBULAR JOINT SURGERY Bilateral 1987   TONSILLECTOMY      Family History  Problem Relation Age of Onset   Colon cancer Cousin 74       double first cousin   Colon cancer Cousin 80       double first cousin   Colon polyps Father        between 28-20   Dementia Father    CAD Father        CABG at age 84   Stroke Mother    Pancreatic cancer Brother 74   Colon polyps Brother        between 4-20   Cancer Paternal Uncle        NOS   Anemia Paternal Grandfather        pernicious anemia   Breast cancer Cousin 31       double first cousin   Breast cancer Cousin 53       paternal cousin   Stroke Other        F 1st degree relative 13, M 1st degree relative   Esophageal cancer Neg Hx    Stomach cancer Neg Hx     Social History   Tobacco Use   Smoking status: Never   Smokeless tobacco: Never   Tobacco comments:    Regular Exercise - Yes  Substance Use Topics   Alcohol use: No    Subjective:  Urinary frequency/ strong odor; concerned for possible UTI; no blood in the urine;  Sense of incomplete emptying;     Objective:  Vitals:   05/01/21 1448  BP: (!) 160/84  Pulse: 64  Temp: 97.7 F (36.5 C)  TempSrc: Oral  SpO2: 96%  Weight: 190 lb (86.2 kg)  Height: 5\' 5"  (1.651 m)    General: Well developed, well nourished, in no acute distress  Skin : Warm and dry.  Head: Normocephalic and atraumatic  Lungs: Respirations unlabored; Neurologic: Alert and oriented; speech intact; face symmetrical; moves all extremities well; CNII-XII intact without focal deficit   Assessment:  1. Dysuria     Plan:  Concern for UTI; check U/A and urine culture; Rx for Macrobid 100 mg bid x  7 days; increase fluids, rest and follow up to be determined.   This visit occurred during the SARS-CoV-2 public health emergency.  Safety protocols were in place, including screening questions prior to the visit, additional usage of staff PPE, and extensive cleaning of exam room while observing  appropriate contact time as indicated for disinfecting solutions.    No follow-ups on file.  Orders Placed This Encounter  Procedures   Urine Culture   POCT urinalysis dipstick    Requested Prescriptions   Signed Prescriptions Disp Refills   nitrofurantoin, macrocrystal-monohydrate, (MACROBID) 100 MG capsule 14 capsule 0    Sig: Take 1 capsule (100 mg total) by mouth 2 (two) times daily.

## 2021-05-02 LAB — URINE CULTURE
MICRO NUMBER:: 12720377
SPECIMEN QUALITY:: ADEQUATE

## 2021-05-03 ENCOUNTER — Other Ambulatory Visit: Payer: Self-pay | Admitting: Family

## 2021-05-03 MED ORDER — PENICILLIN V POTASSIUM 500 MG PO TABS: 500.0000 mg | ORAL_TABLET | Freq: Three times a day (TID) | ORAL | 0 refills | Status: AC

## 2021-05-04 ENCOUNTER — Telehealth: Payer: Self-pay

## 2021-05-04 NOTE — Telephone Encounter (Signed)
Caller states she is returning a call to the office.  Telephone: [phone]

## 2021-05-04 NOTE — Telephone Encounter (Signed)
I have called pt back and relayed the results from the urine. She will pick up the new medication and keep [place] updated on how she is doing.

## 2021-05-08 DIAGNOSIS — M4726 Other spondylosis with radiculopathy, lumbar region: Secondary | ICD-10-CM | POA: Diagnosis not present

## 2021-05-08 DIAGNOSIS — M4326 Fusion of spine, lumbar region: Secondary | ICD-10-CM | POA: Diagnosis not present

## 2021-05-08 DIAGNOSIS — M4126 Other idiopathic scoliosis, lumbar region: Secondary | ICD-10-CM | POA: Diagnosis not present

## 2021-05-16 DIAGNOSIS — M5416 Radiculopathy, lumbar region: Secondary | ICD-10-CM | POA: Diagnosis not present

## 2021-05-22 ENCOUNTER — Encounter: Payer: Self-pay | Admitting: Family

## 2021-05-22 ENCOUNTER — Other Ambulatory Visit: Payer: Self-pay

## 2021-05-22 ENCOUNTER — Ambulatory Visit (HOSPITAL_BASED_OUTPATIENT_CLINIC_OR_DEPARTMENT_OTHER)
Admission: RE | Admit: 2021-05-22 | Discharge: 2021-05-22 | Disposition: A | Discharge status: Home / Self Care | Payer: Medicare HMO | Source: Ambulatory Visit | Attending: Family | Admitting: Family

## 2021-05-22 ENCOUNTER — Ambulatory Visit (INDEPENDENT_AMBULATORY_CARE_PROVIDER_SITE_OTHER): Payer: Medicare HMO | Admitting: Family

## 2021-05-22 VITALS — BP 130/90 | HR 67 | Temp 98.3°F | Ht 65.0 in | Wt 189.4 lb

## 2021-05-22 DIAGNOSIS — R109 Unspecified abdominal pain: Secondary | ICD-10-CM | POA: Insufficient documentation

## 2021-05-22 DIAGNOSIS — M546 Pain in thoracic spine: Secondary | ICD-10-CM | POA: Diagnosis not present

## 2021-05-22 LAB — CBC WITH DIFFERENTIAL/PLATELET
Basophils Absolute: 0 10*3/uL (ref 0.0–0.1)
Basophils Relative: 0.5 % (ref 0.0–3.0)
Eosinophils Absolute: 0.3 10*3/uL (ref 0.0–0.7)
Eosinophils Relative: 4.2 % (ref 0.0–5.0)
HCT: 42.5 % (ref 36.0–46.0)
Hemoglobin: 14.1 g/dL (ref 12.0–15.0)
Lymphocytes Relative: 25.4 % (ref 12.0–46.0)
Lymphs Abs: 1.7 10*3/uL (ref 0.7–4.0)
MCHC: 33.2 g/dL (ref 30.0–36.0)
MCV: 92.1 fl (ref 78.0–100.0)
Monocytes Absolute: 0.6 10*3/uL (ref 0.1–1.0)
Monocytes Relative: 8.3 % (ref 3.0–12.0)
Neutro Abs: 4.2 10*3/uL (ref 1.4–7.7)
Neutrophils Relative %: 61.6 % (ref 43.0–77.0)
Platelets: 179 10*3/uL (ref 150.0–400.0)
RBC: 4.62 Mil/uL (ref 3.87–5.11)
RDW: 13.4 % (ref 11.5–15.5)
WBC: 6.8 10*3/uL (ref 4.0–10.5)

## 2021-05-22 LAB — COMPREHENSIVE METABOLIC PANEL
ALT: 13 U/L (ref 0–35)
AST: 17 U/L (ref 0–37)
Albumin: 4.1 g/dL (ref 3.5–5.2)
Alkaline Phosphatase: 63 U/L (ref 39–117)
BUN: 13 mg/dL (ref 6–23)
CO2: 29 mEq/L (ref 19–32)
Calcium: 9.2 mg/dL (ref 8.4–10.5)
Chloride: 105 mEq/L (ref 96–112)
Creatinine, Ser: 0.79 mg/dL (ref 0.40–1.20)
GFR: 73.23 mL/min (ref >60.00)
Glucose, Bld: 85 mg/dL (ref 70–99)
Potassium: 4.5 mEq/L (ref 3.5–5.1)
Sodium: 140 mEq/L (ref 135–145)
Total Bilirubin: 0.6 mg/dL (ref 0.2–1.2)
Total Protein: 6.5 g/dL (ref 6.0–8.3)

## 2021-05-22 MED ORDER — MELOXICAM 15 MG PO TABS: 15.0000 mg | ORAL_TABLET | Freq: Every day | ORAL | 0 refills | Status: DC

## 2021-05-22 MED ORDER — FLUCONAZOLE 150 MG PO TABS: ORAL_TABLET | ORAL | 0 refills | Status: DC

## 2021-05-22 NOTE — Progress Notes (Signed)
[name] is a 75 y.o. female with the following history as recorded in EpicCare:  Patient Active Problem List   Diagnosis Date Noted   Lichen sclerosus 56/21/3086   Dizziness 10/27/2020   Vitamin B12 deficiency    Mild anxiety    Kidney stone    History of hiatal hernia    History of chicken pox    GERD (gastroesophageal reflux disease)    Gastric polyps    Dyspnea    Depression    Complication of anesthesia    Chronic back pain    Blood dyscrasia    Arthritis    Ankle effusion, left 03/23/2020   Rheumatoid arthritis ([place]) 11/02/2018   Episode of recurrent major depressive disorder ([place]) 12/05/2017   Primary osteoarthritis of both hands 07/09/2017   Primary osteoarthritis of both knees 07/09/2017   DDD (degenerative disc disease), cervical 07/09/2017   DDD (degenerative disc disease), lumbar 07/09/2017   Peripheral edema 11/26/2016   Medicare annual wellness visit, subsequent 06/30/2016   Hx of adenomatous polyp of colon 57/84/6962   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   Breast cancer, left breast ([place]) 07/28/2014   Family history of breast cancer    Family history of colon cancer    Family history of pancreatic cancer    Malignant neoplasm of upper-outer quadrant of left breast in female, 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   Peripheral neuropathy 01/01/2013   Melanoma of upper arm ([place]) 02/10/2012   S/P laparoscopic fundoplication 95/28/4132   Steatosis of liver 12/07/2009   COAGULOPATHY 07/12/2008   Melanoma ([place]) 2010   CHEST XRAY, ABNORMAL 01/04/2008   Chronic cough 12/07/2007   Vitamin D deficiency 04/14/2007   VITAMIN B12 DEFICIENCY 02/27/2007   Essential hypertension 02/27/2007   GERD 02/27/2007   Fibromyalgia 02/27/2007   Breast cancer, right breast ([place]) 1995    Current Outpatient Medications  Medication Sig Dispense Refill   amLODipine (NORVASC) 5 MG tablet Take 1 tablet (5 mg total) by mouth daily. 30 tablet 11   aspirin EC 81  MG tablet Take 1 tablet (81 mg total) by mouth daily. Swallow whole. 150 tablet 2   Cholecalciferol (VITAMIN D3 PO) Take 1,000 Units by mouth daily.     citalopram (CELEXA) 20 MG tablet Take 1 tablet (20 mg total) by mouth daily. 90 tablet 1   cyanocobalamin 2000 MCG tablet Take 1.5 tablets (3,000 mcg total) by mouth daily. 90 tablet    fluconazole (DIFLUCAN) 150 MG tablet Take 1 tablet as directed; repeat after 72 hours 2 tablet 0   furosemide (LASIX) 40 MG tablet Take 1 tablet (40 mg total) by mouth daily. 90 tablet 1   isosorbide mononitrate (IMDUR) 30 MG 24 hr tablet Take 1 tablet (30 mg total) by mouth daily. 90 tablet 3   meloxicam (MOBIC) 15 MG tablet Take 1 tablet (15 mg total) by mouth daily. 30 tablet 0   metoprolol succinate (TOPROL XL) 25 MG 24 hr tablet Take 1 tablet (25 mg total) by mouth daily. 90 tablet 3   nitroGLYCERIN (NITROSTAT) 0.4 MG SL tablet Place 0.4 mg under the tongue every 5 (five) minutes as needed for chest pain.     rosuvastatin (CRESTOR) 10 MG tablet Take 1 tablet (10 mg total) by mouth daily. 90 tablet 3   fluticasone (FLONASE) 50 MCG/ACT nasal spray Place 2 sprays into both nostrils as needed for allergies or rhinitis. (Patient not taking: Reported on 01/02/2021)     No current facility-administered medications  for this visit.    Allergies: Other, Silodosin, Ace inhibitors, Buprenorphine hcl, Morphine and related, Codeine, Sulfonamide derivatives, and Telmisartan  Past Medical History:  Diagnosis Date   Allergy    Ankle effusion, left 03/23/2020   Arthritis    "spine" (07/28/2014)   Blood dyscrasia    bruises and bleed easily   Breast cancer, left breast ([place]) 07/28/2014   Breast cancer, right breast ([place]) [place], ABNORMAL 01/04/2008   Qualifier: Diagnosis of  By: Plotnikov MD, Evie Lacks    Chronic back pain    "all over"   Chronic cough 12/07/2007   Could be related to GERD - relapsed    COAGULOPATHY 07/12/2008   Qualifier: Diagnosis of  By:  Nils Pyle CMA (AAMA), Leisha     Complication of anesthesia    went to sleep easily but hard to wake up up until elbow OR in 2010   CVA (cerebral vascular accident) ([place]) 10/26/2020   DDD (degenerative disc disease), cervical 07/09/2017   DDD (degenerative disc disease), lumbar 07/09/2017   With spinal stenosis.   Dr. Trenton Gammon   Depression    Dizziness 10/27/2020   Dyspnea    Normal Spirometry 03/2008 EF 65% BNP normal 11/2007   Episode of recurrent major depressive disorder ([place]) 12/05/2017   Essential hypertension 02/27/2007   Mild Off meds 2013  BP Readings from Last 3 Encounters:  02/10/12 128/90  08/09/11 134/62  03/06/11 150/86      Family history of breast cancer    Family history of colon cancer    Family history of pancreatic cancer    Fibromyalgia 02/27/2007   Chronic    Gastric polyps    GERD 02/27/2007   S/p Nissen's 2010    GERD (gastroesophageal reflux disease)    History of chicken pox    History of hiatal hernia    Hx of adenomatous polyp of colon 07/03/2015   Kidney stone    right kidney    Lichen sclerosus    Malignant neoplasm of upper-outer quadrant of left breast in female, 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   Medicare annual wellness visit, subsequent 06/30/2016   Melanoma ([place]) 2010   "right elbow; treated at [facility]"   Melanoma of upper arm ([place]) 02/10/2012   2010 R  Elbow ([facility])   Mild anxiety    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   Peripheral edema 11/26/2016   Peripheral neuropathy 01/01/2013   Primary osteoarthritis of both hands 07/09/2017   Primary osteoarthritis of both knees 07/09/2017   Chondromalacia patella   Rheumatoid arthritis ([place]) 11/02/2018   S/P laparoscopic fundoplication 08/81/1031   Steatosis of liver 12/07/2009   Qualifier: Diagnosis of  By: Surface RN, Butch Penny     Vitamin B12 deficiency    VITAMIN B12 DEFICIENCY 02/27/2007   Chronic    Vitamin D deficiency     Past Surgical History:  Procedure Laterality Date    BREAST BIOPSY Left 04/2014   BREAST RECONSTRUCTION WITH PLACEMENT OF TISSUE EXPANDER AND FLEX HD (ACELLULAR HYDRATED DERMIS) Left 07/28/2014   Procedure: LEFT BREAST RECONSTRUCTION PLACEMENT OF LEFT TISSUE EXPANDER ;  Surgeon: Crissie Reese, MD;  Location: [facility];  Service: Plastics;  Laterality: Left;   BREAST RECONSTRUCTION WITH PLACEMENT OF TISSUE EXPANDER AND FLEX HD (ACELLULAR HYDRATED DERMIS) Left 09/08/2014   Procedure: REMOVAL OF TISSUE EXPANDER FROM LEFT BREAST;  Surgeon: Crissie Reese, MD;  Location: [facility];  Service: Plastics;  Laterality: Left;   BUNIONECTOMY Bilateral 1970's  COLONOSCOPY     Dr Sharlett Iles   CYSTOSCOPY WITH RETROGRADE PYELOGRAM, URETEROSCOPY AND STENT PLACEMENT Right 06/09/2015   Procedure: CYSTOSCOPY WITH RIGHT RETROGRADE PYELOGRAM, RIGHT URETEROSCOPY AND RIGHT URTERAL STENT PLACEMENT;  Surgeon: Ardis Hughs, MD;  Location: [facility];  Service: Urology;  Laterality: Right;   ESOPHAGOGASTRODUODENOSCOPY     HERNIA REPAIR     HOLMIUM LASER APPLICATION Right 2/62/0355   Procedure: HOLMIUM LASER APPLICATION;  Surgeon: Ardis Hughs, MD;  Location: [facility];  Service: Urology;  Laterality: Right;   LATISSIMUS FLAP TO BREAST Left 09/08/2014   Procedure: LEFT LATISSIMUS FLAP TO BREAST WITH SALINE IMPLANT FOR BREAST RECONSTRUCTION;  Surgeon: Crissie Reese, MD;  Location: [facility];  Service: Plastics;  Laterality: Left;   LUMBAR FUSION  01/2018   L5-S1 transitional segmental laminectomy and Fusion   MASTECTOMY Right 1996    chemotherapy. pt. states 13 lymph nodes were removed   MASTECTOMY COMPLETE / SIMPLE W/ SENTINEL NODE BIOPSY Left 07/28/2014   MASTECTOMY W/ SENTINEL NODE BIOPSY Left 07/28/2014   Procedure: LEFT MASTECTOMY WITH SENTINEL LYMPH NODE MAPPING;  Surgeon: Autumn Messing III, MD;  Location: [facility];  Service: General;  Laterality: Left;   MELANOMA EXCISION Right 2010   From elbow-- Done at [facility]  97/4163   RECONSTRUCTION BREAST IMMEDIATE / DELAYED  W/ TISSUE EXPANDER Left 07/28/2014   TEMPOROMANDIBULAR JOINT SURGERY Bilateral 1987   TONSILLECTOMY      Family History  Problem Relation Age of Onset   Colon cancer Cousin 81       double first cousin   Colon cancer Cousin 63       double first cousin   Colon polyps Father        between 110-20   Dementia Father    CAD Father        CABG at age 11   Stroke Mother    Pancreatic cancer Brother 27   Colon polyps Brother        between 7-20   Cancer Paternal Uncle        NOS   Anemia Paternal Grandfather        pernicious anemia   Breast cancer Cousin 49       double first cousin   Breast cancer Cousin 42       paternal cousin   Stroke Other        F 1st degree relative 74, M 1st degree relative   Esophageal cancer Neg Hx    Stomach cancer Neg Hx     Social History   Tobacco Use   Smoking status: Never   Smokeless tobacco: Never   Tobacco comments:    Regular Exercise - Yes  Substance Use Topics   Alcohol use: No    Subjective:  Continued concerns for left flank pain; feels like symptoms have been present for past month; feels like pain starts in her left flank and radiates down into her left groin; no urinary symptoms, no blood in urine; spoke with her back specialist about these issues who felt that needed to have kidney source ruled out.      Objective:  Vitals:   05/22/21 0951  BP: 130/90  Pulse: 67  Temp: 98.3 F (36.8 C)  TempSrc: Oral  SpO2: 97%  Weight: 189 lb 6.4 oz (85.9 kg)  Height: _0  (1.651 m)    General: Well developed, well nourished, in no acute distress  Skin : Warm and dry.  Head: Normocephalic and atraumatic  Lungs: Respirations unlabored; clear to auscultation bilaterally without wheeze, rales, rhonchi  CVS exam: normal rate and regular rhythm.  Abdomen: Soft; nontender; nondistended; normoactive bowel sounds; no masses or hepatosplenomegaly  Musculoskeletal: No deformities; no active joint inflammation  Extremities: No edema,  cyanosis, clubbing  Vessels: Symmetric bilaterally  Neurologic: Alert and oriented; speech intact; face symmetrical; moves all extremities well; CNII-XII intact without focal deficit   Assessment:  1. Left flank pain   2. Abdominal pain, unspecified abdominal location     Plan:  ? Muscular source vs renal source; will check U/A and urine culture today; check CMP today; will update thoracic X-ray and abdominal/pelvic CT; trial of Mobic and Tizanidine; follow up to be determined;   This visit occurred during the SARS-CoV-2 public health emergency.  Safety protocols were in place, including screening questions prior to the visit, additional usage of staff PPE, and extensive cleaning of exam room while observing appropriate contact time as indicated for disinfecting solutions.    No follow-ups on file.  Orders Placed This Encounter  Procedures   Urine Culture   DG Thoracic Spine 2 View    Standing Status:   Future    Number of Occurrences:   1    Standing Expiration Date:   05/22/2022    Order Specific Question:   Reason for Exam (SYMPTOM  OR DIAGNOSIS REQUIRED)    Answer:   flank pain    Order Specific Question:   Preferred imaging location?    Answer:   [facility]   CT Abdomen Pelvis W Contrast    Standing Status:   Future    Standing Expiration Date:   05/22/2022    Order Specific Question:   If indicated for the ordered procedure, I authorize the administration of contrast media per Radiology protocol    Answer:   Yes    Order Specific Question:   Preferred imaging location?    Answer:   [facility] Specific Question:   Is Oral Contrast requested for this exam?    Answer:   Yes, Per Radiology protocol   CBC with Differential/Platelet   Comp Met (CMET)   POCT urinalysis dipstick    Requested Prescriptions   Signed Prescriptions Disp Refills   meloxicam (MOBIC) 15 MG tablet 30 tablet 0    Sig: Take 1 tablet (15 mg total) by mouth daily.    fluconazole (DIFLUCAN) 150 MG tablet 2 tablet 0    Sig: Take 1 tablet as directed; repeat after 72 hours

## 2021-05-23 ENCOUNTER — Encounter (HOSPITAL_BASED_OUTPATIENT_CLINIC_OR_DEPARTMENT_OTHER): Payer: Self-pay

## 2021-05-23 ENCOUNTER — Ambulatory Visit (HOSPITAL_BASED_OUTPATIENT_CLINIC_OR_DEPARTMENT_OTHER)
Admission: RE | Admit: 2021-05-23 | Discharge: 2021-05-23 | Disposition: A | Discharge status: Home / Self Care | Payer: Medicare HMO | Source: Ambulatory Visit | Attending: Family | Admitting: Family

## 2021-05-23 DIAGNOSIS — R109 Unspecified abdominal pain: Secondary | ICD-10-CM | POA: Insufficient documentation

## 2021-05-23 DIAGNOSIS — K760 Fatty (change of) liver, not elsewhere classified: Secondary | ICD-10-CM | POA: Diagnosis not present

## 2021-05-23 LAB — URINE CULTURE
MICRO NUMBER:: 12799470
Result:: NO GROWTH
SPECIMEN QUALITY:: ADEQUATE

## 2021-05-23 MED ORDER — IOHEXOL 300 MG/ML  SOLN
100.0000 mL | Freq: Once | INTRAMUSCULAR | Status: AC | PRN
  Administered 2021-05-23: 10:00:00 100 mL via INTRAVENOUS

## 2021-05-30 ENCOUNTER — Telehealth: Payer: Self-pay | Admitting: Family

## 2021-05-30 NOTE — Telephone Encounter (Signed)
I have called pt back and relayed the results to her once more. She stated understanding and has an appointment tomorrow. I asked her about her back and she reported that she is still having a hard time with it.   FYI to provider.

## 2021-05-30 NOTE — Telephone Encounter (Signed)
pt called back regarding imaging results. pt stated she spoke with cma but was unable to actually speak with her due to a group of people being around. please advise.

## 2021-05-31 DIAGNOSIS — M4326 Fusion of spine, lumbar region: Secondary | ICD-10-CM | POA: Diagnosis not present

## 2021-05-31 DIAGNOSIS — M4126 Other idiopathic scoliosis, lumbar region: Secondary | ICD-10-CM | POA: Diagnosis not present

## 2021-05-31 DIAGNOSIS — M5416 Radiculopathy, lumbar region: Secondary | ICD-10-CM | POA: Diagnosis not present

## 2021-06-11 DIAGNOSIS — M5416 Radiculopathy, lumbar region: Secondary | ICD-10-CM | POA: Diagnosis not present

## 2021-06-26 ENCOUNTER — Other Ambulatory Visit: Payer: Self-pay | Admitting: Family

## 2021-06-26 DIAGNOSIS — F419 Anxiety disorder, unspecified: Secondary | ICD-10-CM

## 2021-06-26 DIAGNOSIS — R609 Edema, unspecified: Secondary | ICD-10-CM

## 2021-06-29 DIAGNOSIS — M5416 Radiculopathy, lumbar region: Secondary | ICD-10-CM | POA: Diagnosis not present

## 2021-06-29 DIAGNOSIS — M4126 Other idiopathic scoliosis, lumbar region: Secondary | ICD-10-CM | POA: Diagnosis not present

## 2021-07-02 DIAGNOSIS — N898 Other specified noninflammatory disorders of vagina: Secondary | ICD-10-CM | POA: Diagnosis not present

## 2021-07-02 DIAGNOSIS — R300 Dysuria: Secondary | ICD-10-CM | POA: Diagnosis not present

## 2021-07-02 DIAGNOSIS — L900 Lichen sclerosus et atrophicus: Secondary | ICD-10-CM | POA: Diagnosis not present

## 2021-07-02 DIAGNOSIS — Z01419 Encounter for gynecological examination (general) (routine) without abnormal findings: Secondary | ICD-10-CM | POA: Diagnosis not present

## 2021-07-10 DIAGNOSIS — M5416 Radiculopathy, lumbar region: Secondary | ICD-10-CM | POA: Diagnosis not present

## 2021-07-11 ENCOUNTER — Telehealth: Payer: Self-pay | Admitting: Cardiology

## 2021-07-11 NOTE — Telephone Encounter (Signed)
Pt c/o medication issue:  1. Name of Medication:  isosorbide mononitrate (IMDUR) 30 MG 24 hr tablet (Expired)  metoprolol succinate (TOPROL XL) 25 MG 24 hr tablet  rosuvastatin (CRESTOR) 10 MG tablet (Expired) 2. How are you currently taking this medication (dosage and times per day)?    3. Are you having a reaction (difficulty breathing--STAT)? no  4. What is your medication issue? Calling with questions about the medication. How long should she be taking them, should she still be taking them, whats next etc. Please advise

## 2021-07-12 NOTE — Telephone Encounter (Signed)
Spoke to the patient just now. She tells me that she has been having problems with her blood pressure. Her blood pressure is 113/70 heart rate 60 bpm. She was wanting to know when her follow up appointment was due. I advised that she was due to follow up in October of 2022 but it looks like this visit never happened. We scheduled an appointment together today.   I advised her that with her blood pressure and heart rate being good today that she should continue on her current medications and we will discuss further during her office visit.   She tells me that she has short term memory loss and she apologizes for not following up like she should. I told her that she was fine and we looked forward to seeing her in a couple of weeks.    Encouraged patient to call back with any questions or concerns.

## 2021-07-17 ENCOUNTER — Ambulatory Visit: Payer: Medicare HMO | Admitting: Internal Medicine

## 2021-08-03 DIAGNOSIS — M48062 Spinal stenosis, lumbar region with neurogenic claudication: Secondary | ICD-10-CM | POA: Diagnosis not present

## 2021-08-03 DIAGNOSIS — M5416 Radiculopathy, lumbar region: Secondary | ICD-10-CM | POA: Diagnosis not present

## 2021-08-03 DIAGNOSIS — M4126 Other idiopathic scoliosis, lumbar region: Secondary | ICD-10-CM | POA: Diagnosis not present

## 2021-08-05 NOTE — Progress Notes (Signed)
Cardiology Office Note:    Date:  08/06/2021   ID:  [name], DOB Aug 22, 1945, MRN [id number]  PCP:  Marrian Salvage, [place]  Cardiologist:  Shirlee More, MD    Referring MD: Marrian Salvage,*    ASSESSMENT:    1. Coronary artery disease of native artery of native heart with stable angina pectoris ([place])   2. Agatston coronary artery calcium score less than 100   3. Essential hypertension   4. Pure hypercholesterolemia    PLAN:    In order of problems listed above:  Stable no anginal discomfort continue medical therapy including aspirin lipid-lowering calcium channel blocker oral nitrate. 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  Concerns regarding medicines are outlined above.  No orders of the defined types were placed in this encounter.  No orders of the defined types were placed in this encounter.   Chief Complaint  Patient presents with   Medication Management   Fatigue    History of Present Illness:    [name] is a 76 y.o. female with a hx of CAD, coronary calcium score 63/55th percentile hypertension hyperlipidemia and stroke last seen 12/07/2020.  Coronary CT calcium score reported 11/09/2020 showed calcium score of 63 55th percentile and moderate stenosis in the first diagonal and proximal ramus artery June 2022 cardiac CTA.  Subsequent FFR was normal in both vessels.   Echocardiogram 10/27/2020 showed moderate left ventricular hypertrophy ejection fraction normal 60 to 38% normal diastolic filling pressure the right ventricle is normal in size and function and aortic valve sclerosis without stenosis.   Admission to [facility] 10/27/2020 with complaints of dizziness and CT in the emergency room showing recent acute infarct in the head of the caudate nucleus on the right.  She has felt to have a hypertensive urgency she was initiated on antihypertensive therapy with amlodipine subsequent MRI of the brain did not show infarction.  Blood pressure prior to discharge ranged from 143-154 85-103.  Compliance with diet, lifestyle and medications: Yes  Since her stroke she is not quite the same blood pressure runs in the range of 130/60-70 She is best described as having malaise and fatigue on a beta-blocker She is also having muscle weakness taking a high intensity statin She is not having chest pain edema shortness of breath palpitation or syncope Past Medical History:  Diagnosis Date   Allergy    Ankle effusion, left 03/23/2020   Arthritis    "spine" (07/28/2014)   Blood dyscrasia    bruises and bleed easily   Breast cancer, left breast ([place]) 07/28/2014   Breast cancer, right breast ([place]) [place], ABNORMAL 01/04/2008   Qualifier: Diagnosis of  By: Plotnikov MD, Evie Lacks    Chronic back pain    "all over"   Chronic cough 12/07/2007   Could be related to GERD - relapsed    COAGULOPATHY 07/12/2008   Qualifier: Diagnosis of  By: Nils Pyle CMA (AAMA), Leisha     Complication of anesthesia    went to sleep easily but hard to wake up up until elbow OR in 2010   CVA (cerebral vascular accident) ([place]) 10/26/2020   DDD (  degenerative disc disease), cervical 07/09/2017   DDD (degenerative disc disease), lumbar 07/09/2017   With spinal stenosis.   Dr. Trenton Gammon   Depression    Dizziness 10/27/2020   Dyspnea    Normal Spirometry 03/2008 EF 65% BNP normal 11/2007   Episode of recurrent major depressive disorder ([place]) 12/05/2017   Essential hypertension 02/27/2007   Mild Off meds 2013  BP Readings from Last 3 Encounters:  02/10/12 128/90  08/09/11 134/62  03/06/11 150/86      Family history of breast cancer     Family history of colon cancer    Family history of pancreatic cancer    Fibromyalgia 02/27/2007   Chronic    Gastric polyps    GERD 02/27/2007   S/p Nissen's 2010    GERD (gastroesophageal reflux disease)    History of chicken pox    History of hiatal hernia    Hx of adenomatous polyp of colon 07/03/2015   Kidney stone    right kidney    Lichen sclerosus    Malignant neoplasm of upper-outer quadrant of left breast in female, estrogen receptor positive ([place]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   Medicare annual wellness visit, subsequent 06/30/2016   Melanoma ([place]) 2010   "right elbow; [facility] at [facility]"   Melanoma of upper arm ([facility]) 02/10/2012   2010 R  Elbow ([facility])   Mild anxiety    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   Peripheral edema 11/26/2016   Peripheral neuropathy 01/01/2013   Primary osteoarthritis of both hands 07/09/2017   Primary osteoarthritis of both knees 07/09/2017   Chondromalacia patella   Rheumatoid arthritis ([place]) 11/02/2018   S/P laparoscopic fundoplication 16/02/9603   Steatosis of liver 12/07/2009   Qualifier: Diagnosis of  By: Surface RN, Butch Penny     Vitamin B12 deficiency    VITAMIN B12 DEFICIENCY 02/27/2007   Chronic    Vitamin D deficiency     Past Surgical History:  Procedure Laterality Date   BREAST BIOPSY Left 04/2014   BREAST RECONSTRUCTION WITH PLACEMENT OF TISSUE EXPANDER AND FLEX HD (ACELLULAR HYDRATED DERMIS) Left 07/28/2014   Procedure: LEFT BREAST RECONSTRUCTION PLACEMENT OF LEFT TISSUE EXPANDER ;  Surgeon: Crissie Reese, MD;  Location: [place];  Service: Plastics;  Laterality: Left;   BREAST RECONSTRUCTION WITH PLACEMENT OF TISSUE EXPANDER AND FLEX HD (ACELLULAR HYDRATED DERMIS) Left 09/08/2014   Procedure: REMOVAL OF TISSUE EXPANDER FROM LEFT BREAST;  Surgeon: Crissie Reese, MD;  Location: [facility];  Service: Plastics;  Laterality: Left;   BUNIONECTOMY Bilateral 1970's   COLONOSCOPY     Dr Sharlett Iles   CYSTOSCOPY WITH RETROGRADE PYELOGRAM, URETEROSCOPY AND  STENT PLACEMENT Right 06/09/2015   Procedure: CYSTOSCOPY WITH RIGHT RETROGRADE PYELOGRAM, RIGHT URETEROSCOPY AND RIGHT URTERAL STENT PLACEMENT;  Surgeon: Ardis Hughs, MD;  [facility];  Service: Urology;  Laterality: Right;   ESOPHAGOGASTRODUODENOSCOPY     HERNIA REPAIR     HOLMIUM LASER APPLICATION Right 5/40/9811   Procedure: HOLMIUM LASER APPLICATION;  Surgeon: Ardis Hughs, MD;  [facility];  Service: Urology;  Laterality: Right;   LATISSIMUS FLAP TO BREAST Left 09/08/2014   Procedure: LEFT LATISSIMUS FLAP TO BREAST WITH SALINE IMPLANT FOR BREAST RECONSTRUCTION;  Surgeon: Crissie Reese, MD;  Location: [place];  Service: Plastics;  Laterality: Left;   LUMBAR FUSION  01/2018   L5-S1 transitional segmental laminectomy and Fusion   MASTECTOMY Right 1996    chemotherapy. pt. states 13 lymph nodes were removed   MASTECTOMY COMPLETE / SIMPLE W/ SENTINEL NODE  BIOPSY Left 07/28/2014   MASTECTOMY W/ SENTINEL NODE BIOPSY Left 07/28/2014   Procedure: LEFT MASTECTOMY WITH SENTINEL LYMPH NODE MAPPING;  Surgeon: Autumn Messing III, MD;  Location: [facility];  Service: General;  Laterality: Left;   MELANOMA EXCISION Right 2010   From elbow-- Done at [place]  05/6008   RECONSTRUCTION BREAST IMMEDIATE / DELAYED W/ TISSUE EXPANDER Left 07/28/2014   TEMPOROMANDIBULAR JOINT SURGERY Bilateral 1987   TONSILLECTOMY      Current Medications: Current Meds  Medication Sig   amLODipine (NORVASC) 5 MG tablet Take 1 tablet (5 mg total) by mouth daily.   aspirin EC 81 MG tablet Take 1 tablet (81 mg total) by mouth daily. Swallow whole.   B Complex-Biotin-FA (BIG 100, BIOTIN, PO) Take 1 tablet by mouth 2 (two) times daily.   Cholecalciferol (VITAMIN D3 PO) Take 1,000 Units by mouth daily.   citalopram (CELEXA) 20 MG tablet TAKE ONE TABLET BY MOUTH EVERY MORNING   cyanocobalamin 2000 MCG tablet Take 1.5 tablets (3,000 mcg total) by mouth daily.   furosemide (LASIX) 40 MG tablet TAKE ONE  TABLET BY MOUTH EVERY MORNING   isosorbide mononitrate (IMDUR) 30 MG 24 hr tablet Take 1 tablet (30 mg total) by mouth daily.   metoprolol succinate (TOPROL XL) 25 MG 24 hr tablet Take 1 tablet (25 mg total) by mouth daily.   nitroGLYCERIN (NITROSTAT) 0.4 MG SL tablet Place 0.4 mg under the tongue every 5 (five) minutes as needed for chest pain.   pregabalin (LYRICA) 50 MG capsule Take 50 mg by mouth 2 (two) times daily.   rosuvastatin (CRESTOR) 10 MG tablet Take 1 tablet (10 mg total) by mouth daily.   [DISCONTINUED] fluticasone (FLONASE) 50 MCG/ACT nasal spray Place 2 sprays into both nostrils as needed for allergies or rhinitis.     Allergies:   Other, Silodosin, Ace inhibitors, Buprenorphine hcl, Morphine and related, Codeine, Sulfonamide derivatives, and Telmisartan   Social History   Socioeconomic History   Marital status: Married    Spouse name: Not on file   Number of children: 2   Years of education: Not on file   Highest education level: Not on file  Occupational History   Occupation: Freight forwarder  Tobacco Use   Smoking status: Never   Smokeless tobacco: Never   Tobacco comments:    Regular Exercise - Yes  Vaping Use   Vaping Use: Never used  Substance and Sexual Activity   Alcohol use: No   Drug use: No   Sexual activity: Yes  Other Topics Concern   Not on file  Social History Narrative   She lives with husband.     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 [place] Strain: Not on file  Food Insecurity: Not on file  Transportation Needs: Not on file  Physical Activity: Not on file  Stress: Not on file  Social Connections: Not on file     Family History: The patient's family history includes Anemia in her paternal grandfather; Breast cancer (age of onset: 51) in her cousin; Breast cancer (age of onset: 71) in her cousin; CAD in her father; Cancer in her paternal  uncle; Colon cancer (age of onset: 72) in her cousin; Colon cancer (age of onset: 47) in her cousin; Colon polyps in her brother and father; Dementia in her father; Pancreatic cancer (age of onset: 85)  in her brother; Stroke in her mother and another family member. There is no history of Esophageal cancer or Stomach cancer. ROS:   Please see the history of present illness.    All other systems reviewed and are negative.  EKGs/Labs/Other Studies Reviewed:    The following studies were reviewed today:    Recent Labs: 10/27/2020: TSH 2.070 11/07/2020: Magnesium 2.0 05/22/2021: ALT 13; BUN 13; Creatinine, Ser 0.79; Hemoglobin 14.1; Platelets 179.0; Potassium 4.5; Sodium 140  Recent Lipid Panel    Component Value Date/Time   CHOL 173 12/07/2020 1028   TRIG 101 12/07/2020 1028   HDL 55 12/07/2020 1028   CHOLHDL 3.1 12/07/2020 1028   CHOLHDL 4.6 10/27/2020 0050   VLDL 16 10/27/2020 0050   LDLCALC 100 (H) 12/07/2020 1028   LDLDIRECT 179.7 02/10/2012 0920    Physical Exam:    VS:  BP 132/74 (BP Location: Left Arm, Patient Position: Sitting)    Pulse 65    Ht '5\' 5"'$  (1.651 m)    Wt 195 lb 3.2 oz (88.5 kg)    SpO2 98%    BMI 32.48 kg/m     Wt Readings from Last 3 Encounters:  08/06/21 195 lb 3.2 oz (88.5 kg)  05/22/21 189 lb 6.4 oz (85.9 kg)  05/01/21 190 lb (86.2 kg)     GEN:  Well nourished, well developed in no acute distress HEENT: Normal NECK: No JVD; No carotid bruits LYMPHATICS: No lymphadenopathy CARDIAC: RRR, no murmurs, rubs, gallops RESPIRATORY:  Clear to auscultation without rales, wheezing or rhonchi  ABDOMEN: Soft, non-tender, non-distended MUSCULOSKELETAL:  No edema; No deformity  SKIN: Warm and dry NEUROLOGIC:  Alert and oriented x 3 PSYCHIATRIC:  Normal affect    Signed, Shirlee More, MD  08/06/2021 2:34 PM    [place]

## 2021-08-06 ENCOUNTER — Encounter: Payer: Self-pay | Admitting: Cardiology

## 2021-08-06 ENCOUNTER — Ambulatory Visit: Payer: Medicare HMO | Admitting: Cardiology

## 2021-08-06 ENCOUNTER — Other Ambulatory Visit: Payer: Self-pay

## 2021-08-06 VITALS — BP 132/74 | HR 65 | Ht 65.0 in | Wt 195.2 lb

## 2021-08-06 DIAGNOSIS — R5381 Other malaise: Secondary | ICD-10-CM

## 2021-08-06 DIAGNOSIS — I10 Essential (primary) hypertension: Secondary | ICD-10-CM | POA: Diagnosis not present

## 2021-08-06 DIAGNOSIS — M6281 Muscle weakness (generalized): Secondary | ICD-10-CM | POA: Diagnosis not present

## 2021-08-06 DIAGNOSIS — E7800 Pure hypercholesterolemia, unspecified: Secondary | ICD-10-CM | POA: Diagnosis not present

## 2021-08-06 DIAGNOSIS — I25118 Atherosclerotic heart disease of native coronary artery with other forms of angina pectoris: Secondary | ICD-10-CM | POA: Diagnosis not present

## 2021-08-06 DIAGNOSIS — R931 Abnormal findings on diagnostic imaging of heart and coronary circulation: Secondary | ICD-10-CM

## 2021-08-06 MED ORDER — NEXLETOL 180 MG PO TABS: 180.0000 mg | ORAL_TABLET | Freq: Every day | ORAL | 3 refills | Status: DC

## 2021-08-06 MED ORDER — NEBIVOLOL HCL 5 MG PO TABS: 5.0000 mg | ORAL_TABLET | Freq: Every day | ORAL | 5 refills | Status: DC

## 2021-08-06 MED ORDER — AMLODIPINE BESYLATE 5 MG PO TABS: 5.0000 mg | ORAL_TABLET | Freq: Every day | ORAL | 3 refills | Status: DC

## 2021-08-06 NOTE — Addendum Note (Signed)
Addended by: Jerl Santos R on: 08/06/2021 02:52 PM ? ? Modules accepted: Orders ? ?

## 2021-08-06 NOTE — Patient Instructions (Signed)
Medication Instructions:  ?Your physician has recommended you make the following change in your medication:  ?Discontinue Metoprolol ?Discontinue Rosuvastatin ?Start Bystolic 5 mg daily ?Start Bempedoic Acid 180 mg daily ? ?*If you need a refill on your cardiac medications before your next appointment, please call your pharmacy* ? ? ?Lab Work: ?Your physician recommends that you return for lab work in: today for [place] and Lipid panel ? ?If you have labs (blood work) drawn today and your tests are completely normal, you will receive your results only by: ?MyChart Message (if you have MyChart) OR ?A paper copy in the mail ?If you have any lab test that is abnormal or we need to change your treatment, we will call you to review the results. ? ? ?Testing/Procedures: ?NONE ? ? ?Follow-Up: ?At [facility], you and your health needs are our priority.  As part of our continuing mission to provide you with exceptional heart care, we have created designated Provider Care Teams.  These Care Teams include your primary Cardiologist (physician) and Advanced Practice Providers (APPs -  Physician Assistants and Nurse Practitioners) who all work together to provide you with the care you need, when you need it. ? ?We recommend signing up for the patient portal called "MyChart".  Sign up information is provided on this After Visit Summary.  MyChart is used to connect with patients for Virtual Visits (Telemedicine).  Patients are able to view lab/test results, encounter notes, upcoming appointments, etc.  Non-urgent messages can be sent to your provider as well.   ?To learn more about what you can do with MyChart, go to [email or web address].   ? ?Your next appointment:   ?2 month(s) ? ?The format for your next appointment:   ?In Person ? ?Provider:   ?Shirlee More, MD  ? ? ?Other Instructions ?  ?

## 2021-08-07 LAB — COMPREHENSIVE METABOLIC PANEL
ALT: 16 IU/L (ref 0–32)
AST: 25 IU/L (ref 0–40)
Albumin/Globulin Ratio: 1.7 (ref 1.2–2.2)
Albumin: 4.3 g/dL (ref 3.7–4.7)
Alkaline Phosphatase: 73 IU/L (ref 44–121)
BUN/Creatinine Ratio: 12 (ref 12–28)
BUN: 11 mg/dL (ref 8–27)
Bilirubin Total: 0.3 mg/dL (ref 0.0–1.2)
CO2: 25 mmol/L (ref 20–29)
Calcium: 9.5 mg/dL (ref 8.7–10.3)
Chloride: 106 mmol/L (ref 96–106)
Creatinine, Ser: 0.95 mg/dL (ref 0.57–1.00)
Globulin, Total: 2.6 g/dL (ref 1.5–4.5)
Glucose: 104 mg/dL — ABNORMAL HIGH (ref 70–99)
Potassium: 4 mmol/L (ref 3.5–5.2)
Sodium: 145 mmol/L — ABNORMAL HIGH (ref 134–144)
Total Protein: 6.9 g/dL (ref 6.0–8.5)
eGFR: 62 mL/min/{1.73_m2} (ref >59)

## 2021-08-07 LAB — LIPID PANEL
Chol/HDL Ratio: 3.1 ratio (ref 0.0–4.4)
Cholesterol, Total: 137 mg/dL (ref 100–199)
HDL: 44 mg/dL (ref >39)
LDL Chol Calc (NIH): 61 mg/dL (ref 0–99)
Triglycerides: 194 mg/dL — ABNORMAL HIGH (ref 0–149)
VLDL Cholesterol Cal: 32 mg/dL (ref 5–40)

## 2021-08-09 ENCOUNTER — Ambulatory Visit (INDEPENDENT_AMBULATORY_CARE_PROVIDER_SITE_OTHER): Payer: Medicare HMO | Admitting: Family

## 2021-08-09 VITALS — BP 142/70 | HR 81 | Temp 98.0°F | Ht 65.0 in | Wt 197.4 lb

## 2021-08-09 DIAGNOSIS — J019 Acute sinusitis, unspecified: Secondary | ICD-10-CM | POA: Diagnosis not present

## 2021-08-09 MED ORDER — AMOXICILLIN-POT CLAVULANATE 875-125 MG PO TABS: 1.0000 | ORAL_TABLET | Freq: Two times a day (BID) | ORAL | 0 refills | Status: AC

## 2021-08-09 NOTE — Progress Notes (Signed)
?GEOVANA GEBEL is a 76 y.o. female with the following history as recorded in EpicCare:  ?Patient Active Problem List  ? Diagnosis Date Noted  ? Lichen sclerosus 95/01/3266  ? Dizziness 10/27/2020  ? Vitamin B12 deficiency   ? Mild anxiety   ? Kidney stone   ? History of hiatal hernia   ? History of chicken pox   ? GERD (gastroesophageal reflux disease)   ? Gastric polyps   ? Dyspnea   ? Depression   ? Complication of anesthesia   ? Chronic back pain   ? Blood dyscrasia   ? Arthritis   ? Ankle effusion, left 03/23/2020  ? Rheumatoid arthritis ([place]) 11/02/2018  ? Episode of recurrent major depressive disorder ([place]) 12/05/2017  ? Primary osteoarthritis of both hands 07/09/2017  ? Primary osteoarthritis of both knees 07/09/2017  ? DDD (degenerative disc disease), cervical 07/09/2017  ? DDD (degenerative disc disease), lumbar 07/09/2017  ? Peripheral edema 11/26/2016  ? Medicare annual wellness visit, subsequent 06/30/2016  ? Hx of adenomatous polyp of colon 07/03/2015  ? 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 Breast cancer, left breast ([place]) 07/28/2014  ? Family history of breast cancer   ? Family history of colon cancer   ? Family history of pancreatic cancer   ? Malignant neoplasm of upper-outer quadrant of left breast in female, estrogen receptor positive ([place]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  ? Peripheral neuropathy 01/01/2013  ? Melanoma of upper arm ([place]) 02/10/2012  ? S/P laparoscopic fundoplication 12/45/8099  ? Steatosis of liver 12/07/2009  ? COAGULOPATHY 07/12/2008  ? Melanoma ([place]) 2010  ? CHEST XRAY, ABNORMAL 01/04/2008  ? Chronic cough 12/07/2007  ? Vitamin D deficiency 04/14/2007  ? VITAMIN B12 DEFICIENCY 02/27/2007  ? Essential hypertension 02/27/2007  ? GERD 02/27/2007  ? Fibromyalgia 02/27/2007  ? Breast cancer, right breast ([place]) 1995  ?  ?Current Outpatient Medications  ?Medication Sig Dispense Refill  ? amLODipine (NORVASC) 5 MG tablet Take 1 tablet (5 mg total) by mouth daily. 90 tablet 3  ?  amoxicillin-clavulanate (AUGMENTIN) 875-125 MG tablet Take 1 tablet by mouth 2 (two) times daily for 10 days. 20 tablet 0  ? aspirin EC 81 MG tablet Take 1 tablet (81 mg total) by mouth daily. Swallow whole. 150 tablet 2  ? B Complex-Biotin-FA (BIG 100, BIOTIN, PO) Take 1 tablet by mouth 2 (two) times daily.    ? Bempedoic Acid (NEXLETOL) 180 MG TABS Take 180 mg by mouth daily. 90 tablet 3  ? Cholecalciferol (VITAMIN D3 PO) Take 1,000 Units by mouth daily.    ? citalopram (CELEXA) 20 MG tablet TAKE ONE TABLET BY MOUTH EVERY MORNING 90 tablet 1  ? cyanocobalamin 2000 MCG tablet Take 1.5 tablets (3,000 mcg total) by mouth daily. 90 tablet   ? furosemide (LASIX) 40 MG tablet TAKE ONE TABLET BY MOUTH EVERY MORNING 90 tablet 1  ? nebivolol (BYSTOLIC) 5 MG tablet Take 1 tablet (5 mg total) by mouth daily. 30 tablet 5  ? nitroGLYCERIN (NITROSTAT) 0.4 MG SL tablet Place 0.4 mg under the tongue every 5 (five) minutes as needed for chest pain.    ? pregabalin (LYRICA) 50 MG capsule Take 50 mg by mouth 2 (two) times daily.    ? isosorbide mononitrate (IMDUR) 30 MG 24 hr tablet Take 1 tablet (30 mg total) by mouth daily. 90 tablet 3  ? ?No current facility-administered medications for this visit.  ?  ?Allergies: Other, Silodosin, Ace inhibitors, Buprenorphine hcl, Morphine and related, Codeine,  Sulfonamide derivatives, and Telmisartan  ?Past Medical History:  ?Diagnosis Date  ? Allergy   ? Ankle effusion, left 03/23/2020  ? Arthritis   ? "spine" (07/28/2014)  ? Blood dyscrasia   ? bruises and bleed easily  ? Breast cancer, left breast ([place]) 07/28/2014  ? Breast cancer, right breast ([place]) 1995  ? CHEST XRAY, ABNORMAL 01/04/2008  ? Qualifier: Diagnosis of  By: Plotnikov MD, Evie Lacks   ? Chronic back pain   ? "all over"  ? Chronic cough 12/07/2007  ? Could be related to GERD - relapsed   ? COAGULOPATHY 07/12/2008  ? Qualifier: Diagnosis of  By: Nils Pyle CMA Deborra Medina), Mearl Latin    ? Complication of anesthesia   ? went to sleep easily but  hard to wake up up until elbow OR in 2010  ? CVA (cerebral vascular accident) ([place]) 10/26/2020  ? DDD (degenerative disc disease), cervical 07/09/2017  ? DDD (degenerative disc disease), lumbar 07/09/2017  ? With spinal stenosis.   Dr. Trenton Gammon  ? Depression   ? Dizziness 10/27/2020  ? Dyspnea   ? Normal Spirometry 03/2008 EF 65% BNP normal 11/2007  ? Episode of recurrent major depressive disorder ([place]) 12/05/2017  ? Essential hypertension 02/27/2007  ? Mild Off meds 2013  BP Readings from Last 3 Encounters:  02/10/12 128/90  08/09/11 134/62  03/06/11 150/86     ? Family history of breast cancer   ? Family history of colon cancer   ? Family history of pancreatic cancer   ? Fibromyalgia 02/27/2007  ? Chronic   ? Gastric polyps   ? GERD 02/27/2007  ? S/p Nissen's 2010   ? GERD (gastroesophageal reflux disease)   ? History of chicken pox   ? History of hiatal hernia   ? Hx of adenomatous polyp of colon 07/03/2015  ? Kidney stone   ? right kidney   ? Lichen sclerosus   ? Malignant neoplasm of upper-outer quadrant of left breast in female, estrogen receptor positive ([place]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  ? Medicare annual wellness visit, subsequent 06/30/2016  ? Melanoma ([place]) 2010  ? "right elbow; treated at [facility]"  ? Melanoma of upper arm ([place]) 02/10/2012  ? 2010 R  Elbow ([facility])  ? Mild anxiety   ? 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 Peripheral edema 11/26/2016  ? Peripheral neuropathy 01/01/2013  ? Primary osteoarthritis of both hands 07/09/2017  ? Primary osteoarthritis of both knees 07/09/2017  ? Chondromalacia patella  ? Rheumatoid arthritis ([place]) 11/02/2018  ? S/P laparoscopic fundoplication 87/56/4332  ? Steatosis of liver 12/07/2009  ? Qualifier: Diagnosis of  By: Surface RN, Butch Penny    ? Vitamin B12 deficiency   ? VITAMIN B12 DEFICIENCY 02/27/2007  ? Chronic   ? Vitamin D deficiency   ?  ?Past Surgical History:  ?Procedure Laterality Date  ? BREAST BIOPSY Left 04/2014  ? BREAST RECONSTRUCTION WITH PLACEMENT OF TISSUE EXPANDER  AND FLEX HD (ACELLULAR HYDRATED DERMIS) Left 07/28/2014  ? Procedure: LEFT BREAST RECONSTRUCTION PLACEMENT OF LEFT TISSUE EXPANDER ;  Surgeon: Crissie Reese, MD;  Location: [facility];  Service: Plastics;  Laterality: Left;  ? BREAST RECONSTRUCTION WITH PLACEMENT OF TISSUE EXPANDER AND FLEX HD (ACELLULAR HYDRATED DERMIS) Left 09/08/2014  ? Procedure: REMOVAL OF TISSUE EXPANDER FROM LEFT BREAST;  Surgeon: Crissie Reese, MD;  Location: [facility];  Service: Plastics;  Laterality: Left;  ? BUNIONECTOMY Bilateral 1970's  ? COLONOSCOPY    ? Dr Sharlett Iles  ? CYSTOSCOPY WITH RETROGRADE PYELOGRAM, URETEROSCOPY AND STENT  PLACEMENT Right 06/09/2015  ? Procedure: CYSTOSCOPY WITH RIGHT RETROGRADE PYELOGRAM, RIGHT URETEROSCOPY AND RIGHT URTERAL STENT PLACEMENT;  Surgeon: Ardis Hughs, MD;  [facility];  Service: Urology;  Laterality: Right;  ? ESOPHAGOGASTRODUODENOSCOPY    ? HERNIA REPAIR    ? HOLMIUM LASER APPLICATION Right 05/28/5850  ? Procedure: HOLMIUM LASER APPLICATION;  Surgeon: Ardis Hughs, MD;  [facility];  Service: Urology;  Laterality: Right;  ? LATISSIMUS FLAP TO BREAST Left 09/08/2014  ? Procedure: LEFT LATISSIMUS FLAP TO BREAST WITH SALINE IMPLANT FOR BREAST RECONSTRUCTION;  Surgeon: Crissie Reese, MD;  Location: [facility];  Service: Plastics;  Laterality: Left;  ? LUMBAR FUSION  01/2018  ? L5-S1 transitional segmental laminectomy and Fusion  ? MASTECTOMY Right 1996  ?  chemotherapy. pt. states 13 lymph nodes were removed  ? MASTECTOMY COMPLETE / SIMPLE W/ SENTINEL NODE BIOPSY Left 07/28/2014  ? MASTECTOMY W/ SENTINEL NODE BIOPSY Left 07/28/2014  ? Procedure: LEFT MASTECTOMY WITH SENTINEL LYMPH NODE MAPPING;  Surgeon: Autumn Messing III, MD;  Location: [facility];  Service: General;  Laterality: Left;  ? MELANOMA EXCISION Right 2010  ? From elbow-- Done at [facility]   ? NISSEN FUNDOPLICATION  77/8242  ? RECONSTRUCTION BREAST IMMEDIATE / DELAYED W/ TISSUE EXPANDER Left 07/28/2014  ? TEMPOROMANDIBULAR JOINT SURGERY Bilateral 1987  ?  TONSILLECTOMY    ?  ?Family History  ?Problem Relation Age of Onset  ? Colon cancer Cousin 21  ?     double first cousin  ? Colon cancer Cousin 9  ?     double first cousin  ? Colon polyps Father   ?     between 10-20

## 2021-08-15 ENCOUNTER — Telehealth: Payer: Self-pay

## 2021-08-15 NOTE — Telephone Encounter (Signed)
Original Prior authorization for Nexletol denied.  I have sent a request for redetermination of medicare prescription drug denial to [place]. Will notify patient with outcome when received ?

## 2021-08-16 NOTE — Telephone Encounter (Signed)
Additional requested information with diagnoses code from [facility] was faxed today 08/16/21 for case# [id number]  to [phone] ?

## 2021-09-04 ENCOUNTER — Ambulatory Visit (INDEPENDENT_AMBULATORY_CARE_PROVIDER_SITE_OTHER): Payer: Medicare HMO

## 2021-09-04 DIAGNOSIS — Z0000 Encounter for general adult medical examination without abnormal findings: Secondary | ICD-10-CM | POA: Diagnosis not present

## 2021-09-04 NOTE — Progress Notes (Signed)
Virtual Visit via Telephone Note ? ?I connected with  [name] on 09/04/21 at  3:00 PM EDT by telephone and verified that I am speaking with the correct person using two identifiers. ? ?Medicare Annual Wellness visit completed telephonically due to Covid-19 pandemic.  ? ?Persons participating in this call: This Health Coach and this patient.  ? ?Location: ?Patient: home ?Provider: office ?  ?I discussed the limitations, risks, security and privacy concerns of performing an evaluation and management service by telephone and the availability of in person appointments. The patient expressed understanding and agreed to proceed. ? ?Unable to perform video visit due to video visit attempted and failed and/or patient does not have video capability.  ? ?Some vital signs may be absent or patient reported.  ? ?Willette Brace, LPN ? ?Subjective:  ? [name] is a 76 y.o. female who presents for Medicare Annual (Subsequent) preventive examination. ? ?Review of Systems    ? ?Cardiac Risk Factors include: advanced age (>42mn, >>67women);hypertension;obesity (BMI >30kg/m2) ? ?   ?Objective:  ?  ?There were no vitals filed for this visit. ?There is no height or weight on file to calculate BMI. ? ? ?  09/04/2021  ?  3:15 PM 10/26/2020  ?  9:58 AM 02/28/2020  ?  2:21 PM 02/17/2019  ?  9:54 AM 02/17/2019  ?  9:42 AM 01/29/2018  ? 11:06 AM 06/27/2016  ? 10:01 AM  ?Advanced Directives  ?Does Patient Have a Medical Advance Directive? Yes Yes Yes  Yes Yes Yes  ?Type of Advance Directive Healthcare Power of Attorney Living will;Healthcare Power of [place] will [place] will [place] will [place] will  ?Does patient want to make changes to medical advance directive?  No - Patient declined       ?Copy of HCl[place]portin Chart? Yes - validated most recent copy  scanned in chart (See row information) No - copy requested Yes - validated most recent copy scanned in chart (See row information) Yes - validated most recent copy scanned in chart (See row information)  No - copy requested No - copy requested  ?Would patient like information on creating a medical advance directive?  No - Patient declined       ? ? ?Current Medications (verified) ?Outpatient Encounter Medications as of 09/04/2021  ?Medication Sig  ? amLODipine (NORVASC) 5 MG tablet Take 1 tablet (5 mg total) by mouth daily.  ? aspirin EC 81 MG tablet Take 1 tablet (81 mg total) by mouth daily. Swallow whole.  ? B Complex-Biotin-FA (BIG 100, BIOTIN, PO) Take 1 tablet by mouth 2 (two) times daily.  ? Bempedoic Acid (NEXLETOL) 180 MG TABS Take 180 mg by mouth daily.  ? Cholecalciferol (VITAMIN D3 PO) Take 1,000 Units by mouth daily.  ? citalopram (CELEXA) 20 MG tablet TAKE ONE TABLET BY MOUTH EVERY MORNING  ? cyanocobalamin 2000 MCG tablet Take 1.5 tablets (3,000 mcg total) by mouth daily.  ? furosemide (LASIX) 40 MG tablet TAKE ONE TABLET BY MOUTH EVERY MORNING  ? nebivolol (BYSTOLIC) 5 MG tablet Take 1 tablet (5 mg total) by mouth daily.  ? nitroGLYCERIN (NITROSTAT) 0.4 MG SL tablet Place 0.4 mg under the tongue every 5 (five) minutes as needed for chest pain.  ? pregabalin (LYRICA) 50 MG capsule Take 50 mg by mouth 2 (two) times daily.  ? isosorbide mononitrate (  IMDUR) 30 MG 24 hr tablet Take 1 tablet (30 mg total) by mouth daily.  ? ?No facility-administered encounter medications on file as of 09/04/2021.  ? ? ?Allergies (verified) ?Other, Silodosin, Ace inhibitors, Buprenorphine hcl, Morphine and related, Codeine, Sulfonamide derivatives, and Telmisartan  ? ?History: ?Past Medical History:  ?Diagnosis Date  ? Allergy   ? Ankle effusion, left 03/23/2020  ? Arthritis   ? "spine" (07/28/2014)  ? Blood dyscrasia   ? bruises and bleed easily  ? Breast cancer, left breast ([place]) 07/28/2014  ? Breast cancer, right breast  ([place]) 1995  ? CHEST XRAY, ABNORMAL 01/04/2008  ? Qualifier: Diagnosis of  By: Plotnikov MD, Evie Lacks   ? Chronic back pain   ? "all over"  ? Chronic cough 12/07/2007  ? Could be related to GERD - relapsed   ? COAGULOPATHY 07/12/2008  ? Qualifier: Diagnosis of  By: Nils Pyle CMA Deborra Medina), Mearl Latin    ? Complication of anesthesia   ? went to sleep easily but hard to wake up up until elbow OR in 2010  ? CVA (cerebral vascular accident) ([place]) 10/26/2020  ? DDD (degenerative disc disease), cervical 07/09/2017  ? DDD (degenerative disc disease), lumbar 07/09/2017  ? With spinal stenosis.   Dr. Trenton Gammon  ? Depression   ? Dizziness 10/27/2020  ? Dyspnea   ? Normal Spirometry 03/2008 EF 65% BNP normal 11/2007  ? Episode of recurrent major depressive disorder ([place]) 12/05/2017  ? Essential hypertension 02/27/2007  ? Mild Off meds 2013  BP Readings from Last 3 Encounters:  02/10/12 128/90  08/09/11 134/62  03/06/11 150/86     ? Family history of breast cancer   ? Family history of colon cancer   ? Family history of pancreatic cancer   ? Fibromyalgia 02/27/2007  ? Chronic   ? Gastric polyps   ? GERD 02/27/2007  ? S/p Nissen's 2010   ? GERD (gastroesophageal reflux disease)   ? History of chicken pox   ? History of hiatal hernia   ? Hx of adenomatous polyp of colon 07/03/2015  ? Kidney stone   ? right kidney   ? Lichen sclerosus   ? 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 Medicare annual wellness visit, subsequent 06/30/2016  ? Melanoma ([place]) 2010  ? "right elbow; [facility]"  ? Melanoma of upper arm ([place]) 02/10/2012  ? 2010 R  Elbow ([facility])  ? Mild anxiety   ? 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 Peripheral edema 11/26/2016  ? Peripheral neuropathy 01/01/2013  ? Primary osteoarthritis of both hands 07/09/2017  ? Primary osteoarthritis of both knees 07/09/2017  ? Chondromalacia patella  ? Rheumatoid arthritis ([place]) 11/02/2018  ? S/P laparoscopic  fundoplication 01/00/7121  ? Steatosis of liver 12/07/2009  ? Qualifier: Diagnosis of  By: Surface RN, Butch Penny    ? Vitamin B12 deficiency   ? VITAMIN B12 DEFICIENCY 02/27/2007  ? Chronic   ? Vitamin D deficiency   ? ?Past Surgical History:  ?Procedure Laterality Date  ? BREAST BIOPSY Left 04/2014  ? BREAST RECONSTRUCTION WITH PLACEMENT OF TISSUE EXPANDER AND FLEX HD (ACELLULAR HYDRATED DERMIS) Left 07/28/2014  ? Procedure: LEFT BREAST RECONSTRUCTION PLACEMENT OF LEFT TISSUE EXPANDER ;  Surgeon: Crissie Reese, MD;  Location: [facility];  Service: Plastics;  Laterality: Left;  ? BREAST RECONSTRUCTION WITH PLACEMENT OF TISSUE EXPANDER AND FLEX HD (ACELLULAR HYDRATED DERMIS) Left 09/08/2014  ? Procedure: REMOVAL OF TISSUE  EXPANDER FROM LEFT BREAST;  Surgeon: Crissie Reese, MD;  Location: [facility];  Service: Plastics;  Laterality: Left;  ? BUNIONECTOMY Bilateral 1970's  ? COLONOSCOPY    ? Dr Sharlett Iles  ? CYSTOSCOPY WITH RETROGRADE PYELOGRAM, URETEROSCOPY AND STENT PLACEMENT Right 06/09/2015  ? Procedure: CYSTOSCOPY WITH RIGHT RETROGRADE PYELOGRAM, RIGHT URETEROSCOPY AND RIGHT URTERAL STENT PLACEMENT;  Surgeon: Ardis Hughs, MD;  Location: [facility];  Service: Urology;  Laterality: Right;  ? ESOPHAGOGASTRODUODENOSCOPY    ? HERNIA REPAIR    ? HOLMIUM LASER APPLICATION Right 4/78/2956  ? Procedure: HOLMIUM LASER APPLICATION;  Surgeon: Ardis Hughs, MD;  Location: [facility];  Service: Urology;  Laterality: Right;  ? LATISSIMUS FLAP TO BREAST Left 09/08/2014  ? Procedure: LEFT LATISSIMUS FLAP TO BREAST WITH SALINE IMPLANT FOR BREAST RECONSTRUCTION;  Surgeon: Crissie Reese, MD;  Location: [facility];  Service: Plastics;  Laterality: Left;  ? LUMBAR FUSION  01/2018  ? L5-S1 transitional segmental laminectomy and Fusion  ? MASTECTOMY Right 1996  ?  chemotherapy. pt. states 13 lymph nodes were removed  ? MASTECTOMY COMPLETE / SIMPLE W/ SENTINEL NODE BIOPSY Left 07/28/2014  ? MASTECTOMY W/ SENTINEL NODE BIOPSY Left 07/28/2014  ? Procedure: LEFT  MASTECTOMY WITH SENTINEL LYMPH NODE MAPPING;  Surgeon: Autumn Messing III, MD;  Location: [facility];  Service: General;  Laterality: Left;  ? MELANOMA EXCISION Right 2010  ? From elbow-- Done at [facility]   ? NISSEN FUNDOPLICATION

## 2021-09-04 NOTE — Patient Instructions (Addendum)
[name] , ?Thank you for taking time to come for your Medicare Wellness Visit. I appreciate your ongoing commitment to your health goals. Please review the following plan we discussed and let me know if I can assist you in the future.  ? ?Screening recommendations/referrals: ?Colonoscopy: Done 01/02/21 repeat every 5 years  ?Mammogram: no longer required  ?Bone Density: 03/27/18 repeat every 2 years  ?Recommended yearly ophthalmology/optometry visit for glaucoma screening and checkup ?Recommended yearly dental visit for hygiene and checkup ? ?Vaccinations: ?Influenza vaccine: Due and discussed  ?Pneumococcal vaccine: Up to date ?Tdap vaccine: Done 01/03/13 repeat every 10 years  ?Shingles vaccine: completed 06/02/18, 02/14/18   ?Covid-19:Declined and discussed  ? ?Advanced directives: copies in chart ? ?Conditions/risks identified: Increase walking ? ?Next appointment: Follow up in one year for your annual wellness visit  ? ? ?Preventive Care 58 Years and Older, Female ?Preventive care refers to lifestyle choices and visits with your health care provider that can promote health and wellness. ?What does preventive care include? ?A yearly physical exam. This is also called an annual well check. ?Dental exams once or twice a year. ?Routine eye exams. Ask your health care provider how often you should have your eyes checked. ?Personal lifestyle choices, including: ?Daily care of your teeth and gums. ?Regular physical activity. ?Eating a healthy diet. ?Avoiding tobacco and drug use. ?Limiting alcohol use. ?Practicing safe sex. ?Taking low-dose aspirin every day. ?Taking vitamin and mineral supplements as recommended by your health care provider. ?What happens during an annual well check? ?The services and screenings done by your health care provider during your annual well check will depend on your age, overall health, lifestyle risk factors, and family history of disease. ?Counseling  ?Your health care provider may ask you  questions about your: ?Alcohol use. ?Tobacco use. ?Drug use. ?Emotional well-being. ?Home and relationship well-being. ?Sexual activity. ?Eating habits. ?History of falls. ?Memory and ability to understand (cognition). ?Work and work Statistician. ?Reproductive health. ?Screening  ?You may have the following tests or measurements: ?Height, weight, and BMI. ?Blood pressure. ?Lipid and cholesterol levels. These may be checked every 5 years, or more frequently if you are over 48 years old. ?Skin check. ?Lung cancer screening. You may have this screening every year starting at age 64 if you have a 30-pack-year history of smoking and currently smoke or have quit within the past 15 years. ?Fecal occult blood test (FOBT) of the stool. You may have this test every year starting at age 49. ?Flexible sigmoidoscopy or colonoscopy. You may have a sigmoidoscopy every 5 years or a colonoscopy every 10 years starting at age 21. ?Hepatitis C blood test. ?Hepatitis B blood test. ?Sexually transmitted disease (STD) testing. ?Diabetes screening. This is done by checking your blood sugar (glucose) after you have not eaten for a while (fasting). You may have this done every 1-3 years. ?Bone density scan. This is done to screen for osteoporosis. You may have this done starting at age 33. ?Mammogram. This may be done every 1-2 years. Talk to your health care provider about how often you should have regular mammograms. ?Talk with your health care provider about your test results, treatment options, and if necessary, the need for more tests. ?Vaccines  ?Your health care provider may recommend certain vaccines, such as: ?Influenza vaccine. This is recommended every year. ?Tetanus, diphtheria, and acellular pertussis (Tdap, Td) vaccine. You may need a Td booster every 10 years. ?Zoster vaccine. You may need this after age 37. ?Pneumococcal 13-valent  conjugate (PCV13) vaccine. One dose is recommended after age 65. ?Pneumococcal polysaccharide  (PPSV23) vaccine. One dose is recommended after age 73. ?Talk to your health care provider about which screenings and vaccines you need and how often you need them. ?This information is not intended to replace advice given to you by your health care provider. Make sure you discuss any questions you have with your health care provider. ?Document Released: 06/09/2015 Document Revised: 01/31/2016 Document Reviewed: 03/14/2015 ?Elsevier Interactive Patient Education ? 2017 [employer]. ? ?Fall Prevention in the Home ?Falls can cause injuries. They can happen to people of all ages. There are many things you can do to make your home safe and to help prevent falls. ?What can I do on the outside of my home? ?Regularly fix the edges of walkways and driveways and fix any cracks. ?Remove anything that might make you trip as you walk through a door, such as a raised step or threshold. ?Trim any bushes or trees on the path to your home. ?Use bright outdoor lighting. ?Clear any walking paths of anything that might make someone trip, such as rocks or tools. ?Regularly check to see if handrails are loose or broken. Make sure that both sides of any steps have handrails. ?Any raised decks and porches should have guardrails on the edges. ?Have any leaves, snow, or ice cleared regularly. ?Use sand or salt on walking paths during winter. ?Clean up any spills in your garage right away. This includes oil or grease spills. ?What can I do in the bathroom? ?Use night lights. ?Install grab bars by the toilet and in the tub and shower. Do not use towel bars as grab bars. ?Use non-skid mats or decals in the tub or shower. ?If you need to sit down in the shower, use a plastic, non-slip stool. ?Keep the floor dry. Clean up any water that spills on the floor as soon as it happens. ?Remove soap buildup in the tub or shower regularly. ?Attach bath mats securely with double-sided non-slip rug tape. ?Do not have throw rugs and other things on the  floor that can make you trip. ?What can I do in the bedroom? ?Use night lights. ?Make sure that you have a light by your bed that is easy to reach. ?Do not use any sheets or blankets that are too big for your bed. They should not hang down onto the floor. ?Have a firm chair that has side arms. You can use this for support while you get dressed. ?Do not have throw rugs and other things on the floor that can make you trip. ?What can I do in the kitchen? ?Clean up any spills right away. ?Avoid walking on wet floors. ?Keep items that you use a lot in easy-to-reach places. ?If you need to reach something above you, use a strong step stool that has a grab bar. ?Keep electrical cords out of the way. ?Do not use floor polish or wax that makes floors slippery. If you must use wax, use non-skid floor wax. ?Do not have throw rugs and other things on the floor that can make you trip. ?What can I do with my stairs? ?Do not leave any items on the stairs. ?Make sure that there are handrails on both sides of the stairs and use them. Fix handrails that are broken or loose. Make sure that handrails are as long as the stairways. ?Check any carpeting to make sure that it is firmly attached to the stairs. Fix any carpet that  is loose or worn. ?Avoid having throw rugs at the top or bottom of the stairs. If you do have throw rugs, attach them to the floor with carpet tape. ?Make sure that you have a light switch at the top of the stairs and the bottom of the stairs. If you do not have them, ask someone to add them for you. ?What else can I do to help prevent falls? ?Wear shoes that: ?Do not have high heels. ?Have rubber bottoms. ?Are comfortable and fit you well. ?Are closed at the toe. Do not wear sandals. ?If you use a stepladder: ?Make sure that it is fully opened. Do not climb a closed stepladder. ?Make sure that both sides of the stepladder are locked into place. ?Ask someone to hold it for you, if possible. ?Clearly mark and make  sure that you can see: ?Any grab bars or handrails. ?First and last steps. ?Where the edge of each step is. ?Use tools that help you move around (mobility aids) if they are needed. These include: ?Canes. ?Wa

## 2021-09-21 ENCOUNTER — Telehealth: Payer: Self-pay | Admitting: Internal Medicine

## 2021-09-21 NOTE — Telephone Encounter (Signed)
Inbound call from patient stating that she is having trouble swallowing. Requesting a call back to discuss. Please advise.  ?

## 2021-09-21 NOTE — Telephone Encounter (Signed)
Pt stated that she has been having difficulty swallowing for several months: Pt was scheduled for an office visit to see Dr. Carlean Purl on 10/02/2021 at 8:50: Pt made aware ?Pt verbalized understanding with all questions answered.  ? ?

## 2021-09-24 DIAGNOSIS — H52223 Regular astigmatism, bilateral: Secondary | ICD-10-CM | POA: Diagnosis not present

## 2021-10-02 ENCOUNTER — Telehealth (INDEPENDENT_AMBULATORY_CARE_PROVIDER_SITE_OTHER): Payer: Medicare HMO | Admitting: Family

## 2021-10-02 ENCOUNTER — Ambulatory Visit: Payer: Medicare HMO | Admitting: Internal Medicine

## 2021-10-02 VITALS — BP 135/71 | HR 73

## 2021-10-02 DIAGNOSIS — J069 Acute upper respiratory infection, unspecified: Secondary | ICD-10-CM | POA: Diagnosis not present

## 2021-10-02 NOTE — Progress Notes (Signed)
? ? ?MyChart Video Visit ? ? ? ?Virtual Visit via Video Note  ? ?This visit type was conducted due to national recommendations for restrictions regarding the COVID-19 Pandemic (e.g. social distancing) in an effort to limit this patient's exposure and mitigate transmission in our community. This patient is at least at moderate risk for complications without adequate follow up. This format is felt to be most appropriate for this patient at this time. Physical exam was limited by quality of the video and audio technology used for the visit. CMA was able to get the patient set up on a video visit. ? ?Patient location: Home Patient and provider in visit ?Provider location: Office ? ?I discussed the limitations of evaluation and management by telemedicine and the availability of in person appointments. The patient expressed understanding and agreed to proceed. ? ?Visit Date: 10/02/2021 ? ?Today's healthcare provider: Nance Pear, NP  ? ? ? ?Subjective:  ? ? Patient ID: [name], female    DOB: [birth year], 76 y.o.   MRN: [id number] ? ?Chief Complaint  ?Patient presents with  ? Cough  ?  Complains of productive cough  ? Headache  ?  Complains of headache with "ear pressure"  ? Nasal Congestion  ?  Complains of nasal congestion  ? ? ?Cough ?Pertinent negatives include no myalgias.  ?Headache  ?Associated symptoms include coughing.  ?Patient is in today for a virtual office visit.  ? ?Sinus Symptoms - She complains of  congestion, drainage and coughing that begun on 09/29/2021. She reports her mucus is also yellow. She reports that her husband was sick and she believes she might have gotten sick from him. She has not taken a COVID test but states that her husband has and results came back negative. She has been taking OTC robitussin. She denies of any aches or pains.  ? ? ?Past Medical History:  ?Diagnosis Date  ? Allergy   ? Ankle effusion, left 03/23/2020  ? Arthritis   ? "spine" (07/28/2014)  ? Blood dyscrasia    ? bruises and bleed easily  ? Breast cancer, left breast ([place]) 07/28/2014  ? Breast cancer, right breast ([place]) 1995  ? CHEST XRAY, ABNORMAL 01/04/2008  ? Qualifier: Diagnosis of  By: Plotnikov MD, Evie Lacks   ? Chronic back pain   ? "all over"  ? Chronic cough 12/07/2007  ? Could be related to GERD - relapsed   ? COAGULOPATHY 07/12/2008  ? Qualifier: Diagnosis of  By: Nils Pyle CMA Deborra Medina), Mearl Latin    ? Complication of anesthesia   ? went to sleep easily but hard to wake up up until elbow OR in 2010  ? CVA (cerebral vascular accident) ([place]) 10/26/2020  ? DDD (degenerative disc disease), cervical 07/09/2017  ? DDD (degenerative disc disease), lumbar 07/09/2017  ? With spinal stenosis.   Dr. Trenton Gammon  ? Depression   ? Dizziness 10/27/2020  ? Dyspnea   ? Normal Spirometry 03/2008 EF 65% BNP normal 11/2007  ? Episode of recurrent major depressive disorder ([place]) 12/05/2017  ? Essential hypertension 02/27/2007  ? Mild Off meds 2013  BP Readings from Last 3 Encounters:  02/10/12 128/90  08/09/11 134/62  03/06/11 150/86     ? Family history of breast cancer   ? Family history of colon cancer   ? Family history of pancreatic cancer   ? Fibromyalgia 02/27/2007  ? Chronic   ? Gastric polyps   ? GERD 02/27/2007  ? S/p Nissen's 2010   ? GERD (  gastroesophageal reflux disease)   ? History of chicken pox   ? History of hiatal hernia   ? Hx of adenomatous polyp of colon 07/03/2015  ? Kidney stone   ? right kidney   ? Lichen sclerosus   ? 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 Medicare annual wellness visit, subsequent 06/30/2016  ? Melanoma ([place]) 2010  ? "right elbow; [facility] at [facility]"  ? Melanoma of upper arm ([place]) 02/10/2012  ? 2010 R  Elbow ([facility])  ? Mild anxiety   ? 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 Peripheral edema 11/26/2016  ? Peripheral neuropathy 01/01/2013  ? Primary osteoarthritis of both hands 07/09/2017  ? Primary osteoarthritis of both  knees 07/09/2017  ? Chondromalacia patella  ? Rheumatoid arthritis ([place]) 11/02/2018  ? S/P laparoscopic fundoplication 51/06/5850  ? Steatosis of liver 12/07/2009  ? Qualifier: Diagnosis of  By: Surface RN, Butch Penny    ? Vitamin B12 deficiency   ? VITAMIN B12 DEFICIENCY 02/27/2007  ? Chronic   ? Vitamin D deficiency   ? ? ?Past Surgical History:  ?Procedure Laterality Date  ? BREAST BIOPSY Left 04/2014  ? BREAST RECONSTRUCTION WITH PLACEMENT OF TISSUE EXPANDER AND FLEX HD (ACELLULAR HYDRATED DERMIS) Left 07/28/2014  ? Procedure: LEFT BREAST RECONSTRUCTION PLACEMENT OF LEFT TISSUE EXPANDER ;  Surgeon: Crissie Reese, MD;  Location: [place];  Service: Plastics;  Laterality: Left;  ? BREAST RECONSTRUCTION WITH PLACEMENT OF TISSUE EXPANDER AND FLEX HD (ACELLULAR HYDRATED DERMIS) Left 09/08/2014  ? Procedure: REMOVAL OF TISSUE EXPANDER FROM LEFT BREAST;  Surgeon: Crissie Reese, MD;  Location: [place];  Service: Plastics;  Laterality: Left;  ? BUNIONECTOMY Bilateral 1970's  ? COLONOSCOPY    ? Dr Sharlett Iles  ? CYSTOSCOPY WITH RETROGRADE PYELOGRAM, URETEROSCOPY AND STENT PLACEMENT Right 06/09/2015  ? Procedure: CYSTOSCOPY WITH RIGHT RETROGRADE PYELOGRAM, RIGHT URETEROSCOPY AND RIGHT URTERAL STENT PLACEMENT;  Surgeon: Ardis Hughs, MD;  [facility];  Service: Urology;  Laterality: Right;  ? ESOPHAGOGASTRODUODENOSCOPY    ? HERNIA REPAIR    ? HOLMIUM LASER APPLICATION Right 7/78/2423  ? Procedure: HOLMIUM LASER APPLICATION;  Surgeon: Ardis Hughs, MD;  [facility];  Service: Urology;  Laterality: Right;  ? LATISSIMUS FLAP TO BREAST Left 09/08/2014  ? Procedure: LEFT LATISSIMUS FLAP TO BREAST WITH SALINE IMPLANT FOR BREAST RECONSTRUCTION;  Surgeon: Crissie Reese, MD;  Location: [place];  Service: Plastics;  Laterality: Left;  ? LUMBAR FUSION  01/2018  ? L5-S1 transitional segmental laminectomy and Fusion  ? MASTECTOMY Right 1996  ?  chemotherapy. pt. states 13 lymph nodes were removed  ? MASTECTOMY COMPLETE / SIMPLE W/  SENTINEL NODE BIOPSY Left 07/28/2014  ? MASTECTOMY W/ SENTINEL NODE BIOPSY Left 07/28/2014  ? Procedure: LEFT MASTECTOMY WITH SENTINEL LYMPH NODE MAPPING;  Surgeon: Autumn Messing III, MD;  Location: [place];  Service: General;  Laterality: Left;  ? MELANOMA EXCISION Right 2010  ? From elbow-- Done at [facility]   ? NISSEN FUNDOPLICATION  53/6144  ? RECONSTRUCTION BREAST IMMEDIATE / DELAYED W/ TISSUE EXPANDER Left 07/28/2014  ? TEMPOROMANDIBULAR JOINT SURGERY Bilateral 1987  ? TONSILLECTOMY    ? ? ?Family History  ?Problem Relation Age of Onset  ? Colon cancer Cousin 72  ?     double first cousin  ? Colon cancer Cousin 21  ?     double first cousin  ? Colon polyps Father   ?  between 10-20  ? Dementia Father   ? CAD Father   ?     CABG at age 76  ? Stroke Mother   ? Pancreatic cancer Brother 72  ? Colon polyps Brother   ?     between 10-20  ? Cancer Paternal Uncle   ?     NOS  ? Anemia Paternal Grandfather   ?     pernicious anemia  ? Breast cancer Cousin 26  ?     double first cousin  ? Breast cancer Cousin 42  ?     paternal cousin  ? Stroke Other   ?     F 1st degree relative 63, M 1st degree relative  ? Esophageal cancer Neg Hx   ? Stomach cancer Neg Hx   ? ? ?Social History  ? ?Socioeconomic History  ? Marital status: Married  ?  Spouse name: Not on file  ? Number of children: 2  ? Years of education: Not on file  ? Highest education level: Not on file  ?Occupational History  ? Occupation: Freight forwarder  ?Tobacco Use  ? Smoking status: Never  ? Smokeless tobacco: Never  ? Tobacco comments:  ?  Regular Exercise - Yes  ?Vaping Use  ? Vaping Use: Never used  ?Substance and Sexual Activity  ? Alcohol use: No  ? Drug use: No  ? Sexual activity: Yes  ?Other Topics Concern  ? Not on file  ?Social History Narrative  ? She lives with husband.    ? Highest level of education: high school  ? 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 ?Social Determinants of Health  ? ?Financial Resource Strain: Low Risk   ? Difficulty of Paying  Living Expenses: Not hard at all  ?Food Insecurity: No Food Insecurity  ? Worried About Charity fundraiser in the Last Year: Never true  ? Ran Out of Food in the Last Year: Never true  ?Transportation Needs:

## 2021-10-02 NOTE — Assessment & Plan Note (Addendum)
New. Pt is advised as follows: ? ?Complete a home covid test and call us if positive.  ?Increase hydration and rest. ?Nasal saline as needed for nasal congestion. ?Robitussin DM as needed for cough. ?Call if new/worsening symptoms or if symptoms are not improved in 3-4 days.  ? ?

## 2021-10-03 DIAGNOSIS — L821 Other seborrheic keratosis: Secondary | ICD-10-CM | POA: Diagnosis not present

## 2021-10-03 DIAGNOSIS — D1801 Hemangioma of skin and subcutaneous tissue: Secondary | ICD-10-CM | POA: Diagnosis not present

## 2021-10-03 DIAGNOSIS — Z85820 Personal history of malignant melanoma of skin: Secondary | ICD-10-CM | POA: Diagnosis not present

## 2021-10-03 DIAGNOSIS — L570 Actinic keratosis: Secondary | ICD-10-CM | POA: Diagnosis not present

## 2021-10-03 DIAGNOSIS — D225 Melanocytic nevi of trunk: Secondary | ICD-10-CM | POA: Diagnosis not present

## 2021-10-03 DIAGNOSIS — D692 Other nonthrombocytopenic purpura: Secondary | ICD-10-CM | POA: Diagnosis not present

## 2021-10-03 DIAGNOSIS — Z85828 Personal history of other malignant neoplasm of skin: Secondary | ICD-10-CM | POA: Diagnosis not present

## 2021-10-03 DIAGNOSIS — L814 Other melanin hyperpigmentation: Secondary | ICD-10-CM | POA: Diagnosis not present

## 2021-10-03 DIAGNOSIS — D485 Neoplasm of uncertain behavior of skin: Secondary | ICD-10-CM | POA: Diagnosis not present

## 2021-10-05 ENCOUNTER — Ambulatory Visit (HOSPITAL_BASED_OUTPATIENT_CLINIC_OR_DEPARTMENT_OTHER)
Admission: RE | Admit: 2021-10-05 | Discharge: 2021-10-05 | Disposition: A | Discharge status: Home / Self Care | Payer: Medicare HMO | Source: Ambulatory Visit | Attending: Family | Admitting: Family

## 2021-10-05 ENCOUNTER — Encounter: Payer: Self-pay | Admitting: Family

## 2021-10-05 ENCOUNTER — Ambulatory Visit (INDEPENDENT_AMBULATORY_CARE_PROVIDER_SITE_OTHER): Payer: Medicare HMO | Admitting: Family

## 2021-10-05 VITALS — BP 120/64 | HR 67 | Temp 98.6°F | Ht 65.0 in | Wt 191.8 lb

## 2021-10-05 DIAGNOSIS — R053 Chronic cough: Secondary | ICD-10-CM | POA: Insufficient documentation

## 2021-10-05 DIAGNOSIS — J029 Acute pharyngitis, unspecified: Secondary | ICD-10-CM

## 2021-10-05 DIAGNOSIS — J9811 Atelectasis: Secondary | ICD-10-CM | POA: Diagnosis not present

## 2021-10-05 LAB — COMPREHENSIVE METABOLIC PANEL
ALT: 11 U/L (ref 0–35)
AST: 16 U/L (ref 0–37)
Albumin: 4.3 g/dL (ref 3.5–5.2)
Alkaline Phosphatase: 74 U/L (ref 39–117)
BUN: 10 mg/dL (ref 6–23)
CO2: 30 mEq/L (ref 19–32)
Calcium: 9.3 mg/dL (ref 8.4–10.5)
Chloride: 103 mEq/L (ref 96–112)
Creatinine, Ser: 0.93 mg/dL (ref 0.40–1.20)
GFR: 60.05 mL/min (ref >60.00)
Glucose, Bld: 100 mg/dL — ABNORMAL HIGH (ref 70–99)
Potassium: 3.9 mEq/L (ref 3.5–5.1)
Sodium: 141 mEq/L (ref 135–145)
Total Bilirubin: 0.5 mg/dL (ref 0.2–1.2)
Total Protein: 6.9 g/dL (ref 6.0–8.3)

## 2021-10-05 LAB — CBC WITH DIFFERENTIAL/PLATELET
Basophils Absolute: 0.1 10*3/uL (ref 0.0–0.1)
Basophils Relative: 1.1 % (ref 0.0–3.0)
Eosinophils Absolute: 0.3 10*3/uL (ref 0.0–0.7)
Eosinophils Relative: 3.9 % (ref 0.0–5.0)
HCT: 40.6 % (ref 36.0–46.0)
Hemoglobin: 13.7 g/dL (ref 12.0–15.0)
Lymphocytes Relative: 25.2 % (ref 12.0–46.0)
Lymphs Abs: 1.8 10*3/uL (ref 0.7–4.0)
MCHC: 33.9 g/dL (ref 30.0–36.0)
MCV: 92.7 fl (ref 78.0–100.0)
Monocytes Absolute: 0.5 10*3/uL (ref 0.1–1.0)
Monocytes Relative: 7.6 % (ref 3.0–12.0)
Neutro Abs: 4.4 10*3/uL (ref 1.4–7.7)
Neutrophils Relative %: 62.2 % (ref 43.0–77.0)
Platelets: 205 10*3/uL (ref 150.0–400.0)
RBC: 4.38 Mil/uL (ref 3.87–5.11)
RDW: 13.2 % (ref 11.5–15.5)
WBC: 7 10*3/uL (ref 4.0–10.5)

## 2021-10-05 LAB — POCT RAPID STREP A (OFFICE): Rapid Strep A Screen: NEGATIVE

## 2021-10-05 MED ORDER — FLUTICASONE PROPIONATE 50 MCG/ACT NA SUSP: 2.0000 | Freq: Every day | NASAL | 6 refills | Status: DC

## 2021-10-05 MED ORDER — PREDNISONE 20 MG PO TABS: 20.0000 mg | ORAL_TABLET | Freq: Every day | ORAL | 0 refills | Status: DC

## 2021-10-05 MED ORDER — DOXYCYCLINE HYCLATE 100 MG PO TABS: 100.0000 mg | ORAL_TABLET | Freq: Two times a day (BID) | ORAL | 0 refills | Status: DC

## 2021-10-05 MED ORDER — LORATADINE 10 MG PO TABS: 10.0000 mg | ORAL_TABLET | Freq: Every day | ORAL | 2 refills | Status: DC

## 2021-10-05 NOTE — Progress Notes (Signed)
?[name] is a 76 y.o. female with the following history as recorded in EpicCare:  ?Patient Active Problem List  ? Diagnosis Date Noted  ? Lichen sclerosus 70/26/3785  ? Dizziness 10/27/2020  ? Vitamin B12 deficiency   ? Mild anxiety   ? Kidney stone   ? History of hiatal hernia   ? History of chicken pox   ? GERD (gastroesophageal reflux disease)   ? Gastric polyps   ? Dyspnea   ? Depression   ? Complication of anesthesia   ? Chronic back pain   ? Blood dyscrasia   ? Arthritis   ? Ankle effusion, left 03/23/2020  ? Rheumatoid arthritis ([place]) 11/02/2018  ? Episode of recurrent major depressive disorder ([place]) 12/05/2017  ? Primary osteoarthritis of both hands 07/09/2017  ? Primary osteoarthritis of both knees 07/09/2017  ? DDD (degenerative disc disease), cervical 07/09/2017  ? DDD (degenerative disc disease), lumbar 07/09/2017  ? Peripheral edema 11/26/2016  ? Medicare annual wellness visit, subsequent 06/30/2016  ? Hx of adenomatous polyp of colon 07/03/2015  ? Viral URI with cough 09/11/2014  ? 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 Breast cancer, left breast ([place]) 07/28/2014  ? Family history of breast cancer   ? Family history of colon cancer   ? Family history of pancreatic cancer   ? 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 Peripheral neuropathy 01/01/2013  ? Melanoma of upper arm ([place]) 02/10/2012  ? S/P laparoscopic fundoplication 88/50/2774  ? Steatosis of liver 12/07/2009  ? COAGULOPATHY 07/12/2008  ? Melanoma ([place]) 2010  ? CHEST XRAY, ABNORMAL 01/04/2008  ? Chronic cough 12/07/2007  ? Vitamin D deficiency 04/14/2007  ? VITAMIN B12 DEFICIENCY 02/27/2007  ? Essential hypertension 02/27/2007  ? GERD 02/27/2007  ? Fibromyalgia 02/27/2007  ? Breast cancer, right breast ([place]) 1995  ?  ?Current Outpatient Medications  ?Medication Sig Dispense Refill  ? amLODipine (NORVASC) 5 MG tablet Take 1 tablet (5 mg total) by mouth  daily. 90 tablet 3  ? aspirin EC 81 MG tablet Take 1 tablet (81 mg total) by mouth daily. Swallow whole. 150 tablet 2  ? B Complex-Biotin-FA (BIG 100, BIOTIN, PO) Take 1 tablet by mouth 2 (two) times daily.    ? Bempedoic Acid (NEXLETOL) 180 MG TABS Take 180 mg by mouth daily. 90 tablet 3  ? Cholecalciferol (VITAMIN D3 PO) Take 1,000 Units by mouth daily.    ? citalopram (CELEXA) 20 MG tablet TAKE ONE TABLET BY MOUTH EVERY MORNING 90 tablet 1  ? cyanocobalamin 2000 MCG tablet Take 1.5 tablets (3,000 mcg total) by mouth daily. 90 tablet   ? doxycycline (VIBRA-TABS) 100 MG tablet Take 1 tablet (100 mg total) by mouth 2 (two) times daily. 14 tablet 0  ? fluticasone (FLONASE) 50 MCG/ACT nasal spray Place 2 sprays into both nostrils daily. 16 g 6  ? furosemide (LASIX) 40 MG tablet TAKE ONE TABLET BY MOUTH EVERY MORNING 90 tablet 1  ? loratadine (CLARITIN) 10 MG tablet Take 1 tablet (10 mg total) by mouth daily. 30 tablet 2  ? nebivolol (BYSTOLIC) 5 MG tablet Take 1 tablet (5 mg total) by mouth daily. 30 tablet 5  ? nitroGLYCERIN (NITROSTAT) 0.4 MG SL tablet Place 0.4 mg under the tongue every 5 (five) minutes as needed for chest pain.    ? predniSONE (DELTASONE) 20 MG tablet Take 1 tablet (20 mg total) by mouth daily with breakfast. 5  tablet 0  ? pregabalin (LYRICA) 50 MG capsule Take 50 mg by mouth 2 (two) times daily.    ? isosorbide mononitrate (IMDUR) 30 MG 24 hr tablet Take 1 tablet (30 mg total) by mouth daily. 90 tablet 3  ? ?No current facility-administered medications for this visit.  ?  ?Allergies: Other, Silodosin, Ace inhibitors, Buprenorphine hcl, Morphine and related, Codeine, Sulfonamide derivatives, and Telmisartan  ?Past Medical History:  ?Diagnosis Date  ? Allergy   ? Ankle effusion, left 03/23/2020  ? Arthritis   ? "spine" (07/28/2014)  ? Blood dyscrasia   ? bruises and bleed easily  ? Breast cancer, left breast ([place]) 07/28/2014  ? Breast cancer, right breast ([place]) 1995  ? CHEST XRAY, ABNORMAL 01/04/2008   ? Qualifier: Diagnosis of  By: Plotnikov MD, Evie Lacks   ? Chronic back pain   ? "all over"  ? Chronic cough 12/07/2007  ? Could be related to GERD - relapsed   ? COAGULOPATHY 07/12/2008  ? Qualifier: Diagnosis of  By: Nils Pyle CMA Deborra Medina), Mearl Latin    ? Complication of anesthesia   ? went to sleep easily but hard to wake up up until elbow OR in 2010  ? CVA (cerebral vascular accident) ([place]) 10/26/2020  ? DDD (degenerative disc disease), cervical 07/09/2017  ? DDD (degenerative disc disease), lumbar 07/09/2017  ? With spinal stenosis.   Dr. Trenton Gammon  ? Depression   ? Dizziness 10/27/2020  ? Dyspnea   ? Normal Spirometry 03/2008 EF 65% BNP normal 11/2007  ? Episode of recurrent major depressive disorder ([place]) 12/05/2017  ? Essential hypertension 02/27/2007  ? Mild Off meds 2013  BP Readings from Last 3 Encounters:  02/10/12 128/90  08/09/11 134/62  03/06/11 150/86     ? Family history of breast cancer   ? Family history of colon cancer   ? Family history of pancreatic cancer   ? Fibromyalgia 02/27/2007  ? Chronic   ? Gastric polyps   ? GERD 02/27/2007  ? S/p Nissen's 2010   ? GERD (gastroesophageal reflux disease)   ? History of chicken pox   ? History of hiatal hernia   ? Hx of adenomatous polyp of colon 07/03/2015  ? Kidney stone   ? right kidney   ? Lichen sclerosus   ? 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 Medicare annual wellness visit, subsequent 06/30/2016  ? Melanoma ([place]) 2010  ? "right elbow; treated at [facility]"  ? Melanoma of upper arm ([place]) 02/10/2012  ? 2010 R  Elbow ([facility])  ? Mild anxiety   ? 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 Peripheral edema 11/26/2016  ? Peripheral neuropathy 01/01/2013  ? Primary osteoarthritis of both hands 07/09/2017  ? Primary osteoarthritis of both knees 07/09/2017  ? Chondromalacia patella  ? Rheumatoid arthritis ([place]) 11/02/2018  ? S/P laparoscopic fundoplication 16/60/6301  ? Steatosis of liver  12/07/2009  ? Qualifier: Diagnosis of  By: Surface RN, Butch Penny    ? Vitamin B12 deficiency   ? VITAMIN B12 DEFICIENCY 02/27/2007  ? Chronic   ? Vitamin D deficiency   ?  ?Past Surgical History:  ?Procedure Laterality Date  ? BREAST BIOPSY Left 04/2014  ? BREAST RECONSTRUCTION WITH PLACEMENT OF TISSUE EXPANDER AND FLEX HD (ACELLULAR HYDRATED DERMIS) Left 07/28/2014  ? Procedure: LEFT BREAST RECONSTRUCTION PLACEMENT OF LEFT TISSUE EXPANDER ;  Surgeon: Crissie Reese, MD;  Location: [place];  Service: Plastics;  Laterality: Left;  ?  BREAST RECONSTRUCTION WITH PLACEMENT OF TISSUE EXPANDER AND FLEX HD (ACELLULAR HYDRATED DERMIS) Left 09/08/2014  ? Procedure: REMOVAL OF TISSUE EXPANDER FROM LEFT BREAST;  Surgeon: Crissie Reese, MD;  Location: [place];  Service: Plastics;  Laterality: Left;  ? BUNIONECTOMY Bilateral 1970's  ? COLONOSCOPY    ? Dr Sharlett Iles  ? CYSTOSCOPY WITH RETROGRADE PYELOGRAM, URETEROSCOPY AND STENT PLACEMENT Right 06/09/2015  ? Procedure: CYSTOSCOPY WITH RIGHT RETROGRADE PYELOGRAM, RIGHT URETEROSCOPY AND RIGHT URTERAL STENT PLACEMENT;  Surgeon: Ardis Hughs, MD;  [facility];  Service: Urology;  Laterality: Right;  ? ESOPHAGOGASTRODUODENOSCOPY    ? HERNIA REPAIR    ? HOLMIUM LASER APPLICATION Right 3/81/0175  ? Procedure: HOLMIUM LASER APPLICATION;  Surgeon: Ardis Hughs, MD;  [facility];  Service: Urology;  Laterality: Right;  ? LATISSIMUS FLAP TO BREAST Left 09/08/2014  ? Procedure: LEFT LATISSIMUS FLAP TO BREAST WITH SALINE IMPLANT FOR BREAST RECONSTRUCTION;  Surgeon: Crissie Reese, MD;  Location: [place];  Service: Plastics;  Laterality: Left;  ? LUMBAR FUSION  01/2018  ? L5-S1 transitional segmental laminectomy and Fusion  ? MASTECTOMY Right 1996  ?  chemotherapy. pt. states 13 lymph nodes were removed  ? MASTECTOMY COMPLETE / SIMPLE W/ SENTINEL NODE BIOPSY Left 07/28/2014  ? MASTECTOMY W/ SENTINEL NODE BIOPSY Left 07/28/2014  ? Procedure: LEFT MASTECTOMY WITH SENTINEL LYMPH NODE MAPPING;   Surgeon: Autumn Messing III, MD;  Location: [place];  Service: General;  Laterality: Left;  ? MELANOMA EXCISION Right 2010  ? From elbow-- Done [facility]   ? NISSEN FUNDOPLICATION  02/2584  ? RECONSTRUCTION BREAST IMM

## 2021-10-17 DIAGNOSIS — T7840XA Allergy, unspecified, initial encounter: Secondary | ICD-10-CM | POA: Insufficient documentation

## 2021-10-24 NOTE — Progress Notes (Unsigned)
Cardiology Office Note:    Date:  10/25/2021   ID:  [name], DOB [birth year], MRN [id number]  PCP:  Marrian Salvage, [place]  Cardiologist:  Shirlee More, MD    Referring MD: Marrian Salvage,*    ASSESSMENT:    1. [place]atston coronary artery calcium score less than 100   2. Coronary artery disease of native artery of native heart with stable angina pectoris ([place])   3. Essential hypertension   4. Pure hypercholesterolemia    PLAN:    In order of problems listed above:  She clearly needs to be on lipid-lowering therapy refer to lipid clinic Pharm.D. I am at a loss as to why she was not approved for lipid-lowering treatment.  It was an issue with her insurance and the drug then she should be taken a PCSK9 inhibitor. She is having symptoms that are not anginal in nature we will check an EKG if reassuring I do not think we need to repeat an ischemia evaluation and she can stop the beta-blocker with marked malaise and fatigue.  She has not needed to take nitroglycerin Very well controlled continue other treatment including diuretic calcium channel blocker Refer to lipid clinic   Next appointment: 6 months   Medication Adjustments/Labs and Tests Ordered: Current medicines are reviewed at length with the patient today.  Concerns regarding medicines are outlined above.  No orders of the defined types were placed in this encounter.  No orders of the defined types were placed in this encounter.   Chief Complaint  Patient presents with   Follow-up   Coronary Artery Disease    History of Present Illness:    [name] is a 76 y.o. female with a hx of CAD, coronary calcium score 63/55th percentile hypertension hyperlipidemia and stroke CAD, coronary calcium score 63/55th percentile hypertension hyperlipidemia and stroke  last seen 08/06/2021.  Coronary CT calcium score reported 11/09/2020 showed calcium score of 63 55th percentile and moderate stenosis in the first  diagonal and proximal ramus artery.  Subsequent FFR was normal in both vessels.   Echocardiogram 10/27/2020 showed moderate left ventricular hypertrophy ejection fraction normal 60 to 40% normal diastolic filling pressure the right ventricle is normal in size and function and aortic valve sclerosis without stenosis.   Admission to [facility] 10/27/2020 with complaints of dizziness and CT in the emergency room showing recent acute infarct in the head of the caudate nucleus on the right.  She has felt to have a hypertensive urgency she was initiated on antihypertensive therapy with amlodipine subsequent MRI of the brain did not show infarction.  Blood pressure prior to discharge ranged from 143-154 85-103.  Compliance with diet, lifestyle and medications: Yes  I was unaware that she did not receive lipid-lowering treatment with bempedoic acid She is statin intolerant she has had a stroke as coronary disease I just cannot understand the decision making process but I will refer her to the Pharm.D. at the lipid-lowering clinic.  She is not doing well she has multiple complaints The first is malaise and fatigue she takes a beta-blocker She has a history of esophageal surgery for reflux she is regurgitating food she is having severe heartburn she is seeing GI later in the day he is not on a PPI and she is having vague left chest discomfort It is not anginal in nature it is there all the time is on relieved with rest We will go ahead and do an EKG in the  office if it is reassuring I do not think we need to repeat an ischemia evaluation with moderate CAD and a cardiac CTA a year ago If she did not have an appointment with GI today had started her on a PPI She has good technique with her blood pressure checks at home frequently and is always in the range of 1 97-0 30 systolic Past Medical History:  Diagnosis Date   Allergy    Ankle effusion, left 03/23/2020   Arthritis    "spine" (07/28/2014)    Blood dyscrasia    bruises and bleed easily   Breast cancer, left breast ([place]) 07/28/2014   Breast cancer, right breast ([place]) [place], ABNORMAL 01/04/2008   Qualifier: Diagnosis of  By: Plotnikov MD, Evie Lacks    Chronic back pain    "all over"   Chronic cough 12/07/2007   Could be related to GERD - relapsed    COAGULOPATHY 07/12/2008   Qualifier: Diagnosis of  By: Nils Pyle CMA (AAMA), Leisha     Complication of anesthesia    went to sleep easily but hard to wake up up until elbow OR in 2010   DDD (degenerative disc disease), cervical 07/09/2017   DDD (degenerative disc disease), lumbar 07/09/2017   With spinal stenosis.   Dr. Trenton Gammon   Depression    Dizziness 10/27/2020   Dyspnea    Normal Spirometry 03/2008 EF 65% BNP normal 11/2007   Episode of recurrent major depressive disorder ([place]) 12/05/2017   Essential hypertension 02/27/2007   Mild Off meds 2013  BP Readings from Last 3 Encounters:  02/10/12 128/90  08/09/11 134/62  03/06/11 150/86      Family history of breast cancer    Family history of colon cancer    Family history of pancreatic cancer    Fibromyalgia 02/27/2007   Chronic    Gastric polyps    GERD 02/27/2007   S/p Nissen's 2010    GERD (gastroesophageal reflux disease)    History of chicken pox    History of hiatal hernia    Hx of adenomatous polyp of colon 07/03/2015   Kidney stone    right kidney    Lichen sclerosus    Malignant neoplasm of upper-outer quadrant of left breast in female, estrogen receptor positive ([place]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   Medicare annual wellness visit, subsequent 06/30/2016   Melanoma ([place]) 2010   "right elbow; treated at [facility]"   Melanoma of upper arm ([place]) 02/10/2012   2010 R  Elbow ([facility])   Mild anxiety    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   Peripheral edema 11/26/2016   Peripheral neuropathy 01/01/2013   Primary osteoarthritis of both hands 07/09/2017   Primary osteoarthritis of both knees 07/09/2017   Chondromalacia  patella   Rheumatoid arthritis ([place]) 11/02/2018   S/P laparoscopic fundoplication 26/37/8588   Steatosis of liver 12/07/2009   Qualifier: Diagnosis of  By: Surface RN, Donna     Viral URI with cough 09/11/2014   Vitamin B12 deficiency    VITAMIN B12 DEFICIENCY 02/27/2007   Chronic    Vitamin D deficiency     Past Surgical History:  Procedure Laterality Date   BREAST BIOPSY Left 04/2014   BREAST RECONSTRUCTION WITH PLACEMENT OF TISSUE EXPANDER AND FLEX HD (ACELLULAR HYDRATED DERMIS) Left 07/28/2014   Procedure: LEFT BREAST RECONSTRUCTION PLACEMENT OF LEFT TISSUE EXPANDER ;  Surgeon: Crissie Reese, MD;  Location: [place];  Service: Plastics;  Laterality: Left;   BREAST RECONSTRUCTION WITH PLACEMENT  OF TISSUE EXPANDER AND FLEX HD (ACELLULAR HYDRATED DERMIS) Left 09/08/2014   Procedure: REMOVAL OF TISSUE EXPANDER FROM LEFT BREAST;  Surgeon: Crissie Reese, MD;  Location: [place];  Service: Plastics;  Laterality: Left;   BUNIONECTOMY Bilateral 1970's   COLONOSCOPY     Dr Sharlett Iles   CYSTOSCOPY WITH RETROGRADE PYELOGRAM, URETEROSCOPY AND STENT PLACEMENT Right 06/09/2015   Procedure: CYSTOSCOPY WITH RIGHT RETROGRADE PYELOGRAM, RIGHT URETEROSCOPY AND RIGHT URTERAL STENT PLACEMENT;  Surgeon: Ardis Hughs, MD;  [facility];  Service: Urology;  Laterality: Right;   ESOPHAGOGASTRODUODENOSCOPY     HERNIA REPAIR     HOLMIUM LASER APPLICATION Right 4/33/2951   Procedure: HOLMIUM LASER APPLICATION;  Surgeon: Ardis Hughs, MD;  [facility];  Service: Urology;  Laterality: Right;   LATISSIMUS FLAP TO BREAST Left 09/08/2014   Procedure: LEFT LATISSIMUS FLAP TO BREAST WITH SALINE IMPLANT FOR BREAST RECONSTRUCTION;  Surgeon: Crissie Reese, MD;  Location: [place];  Service: Plastics;  Laterality: Left;   LUMBAR FUSION  01/2018   L5-S1 transitional segmental laminectomy and Fusion   MASTECTOMY Right 1996    chemotherapy. pt. states 13 lymph nodes were removed   MASTECTOMY COMPLETE / SIMPLE W/  SENTINEL NODE BIOPSY Left 07/28/2014   MASTECTOMY W/ SENTINEL NODE BIOPSY Left 07/28/2014   Procedure: LEFT MASTECTOMY WITH SENTINEL LYMPH NODE MAPPING;  Surgeon: Autumn Messing III, MD;  Location: [place];  Service: General;  Laterality: Left;   MELANOMA EXCISION Right 2010   From elbow-- Done at [place]  88/4166   RECONSTRUCTION BREAST IMMEDIATE / DELAYED W/ TISSUE EXPANDER Left 07/28/2014   TEMPOROMANDIBULAR JOINT SURGERY Bilateral 1987   TONSILLECTOMY      Current Medications: Current Meds  Medication Sig   amLODipine (NORVASC) 5 MG tablet Take 1 tablet (5 mg total) by mouth daily.   aspirin EC 81 MG tablet Take 1 tablet (81 mg total) by mouth daily. Swallow whole.   furosemide (LASIX) 40 MG tablet TAKE ONE TABLET BY MOUTH EVERY MORNING   isosorbide mononitrate (IMDUR) 30 MG 24 hr tablet Take 30 mg by mouth daily.   nebivolol (BYSTOLIC) 5 MG tablet Take 1 tablet (5 mg total) by mouth daily.   nitroGLYCERIN (NITROSTAT) 0.4 MG SL tablet Place 0.4 mg under the tongue every 5 (five) minutes as needed for chest pain.   pregabalin (LYRICA) 50 MG capsule Take 50 mg by mouth 2 (two) times daily.     Allergies:   Other, Silodosin, Ace inhibitors, Buprenorphine hcl, Morphine and related, Codeine, Sulfonamide derivatives, and Telmisartan   Social History   Socioeconomic History   Marital status: Married    Spouse name: Not on file   Number of children: 2   Years of education: Not on file   Highest education level: Not on file  Occupational History   Occupation: Freight forwarder  Tobacco Use   Smoking status: Never   Smokeless tobacco: Never   Tobacco comments:    Regular Exercise - Yes  Vaping Use   Vaping Use: Never used  Substance and Sexual Activity   Alcohol use: No   Drug use: No   Sexual activity: Yes  Other Topics Concern   Not on file  Social History Narrative   She lives with husband.     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  Difficulty of Paying Living Expenses: Not hard at all  Food Insecurity: No Food Insecurity   Worried About [place] in the Last Year: Never true   Ran Out of Food in the Last Year: Never true  Transportation Needs: No Transportation Needs   Lack of Transportation (Medical): No   Lack of Transportation (Non-Medical): No  Physical Activity: Inactive   Days of Exercise per Week: 0 days   Minutes of Exercise per Session: 0 min  Stress: No Stress Concern Present   Feeling of Stress : Not at all  Social Connections: Moderately Integrated   Frequency of Communication with Friends and Family: More than three times a week   Frequency of Social Gatherings with Friends and Family: More than three times a week   Attends Religious Services: More than 4 times per year   Active Member of [employer] or Organizations: No   Attends Music therapist: Never   Marital Status: Married     Family History: The patient's family history includes Anemia in her paternal grandfather; Breast cancer (age of onset: 43) in her cousin; Breast cancer (age of onset: 71) in her cousin; CAD in her father; Cancer in her paternal uncle; Colon cancer (age of onset: 82) in her cousin; Colon cancer (age of onset: 75) in her cousin; Colon polyps in her brother and father; Dementia in her father; Pancreatic cancer (age of onset: 68) in her brother; Stroke in her mother and another family member. There is no history of Esophageal cancer or Stomach cancer. ROS:   Please see the history of present illness.    All other systems reviewed and are negative.  EKGs/Labs/Other Studies Reviewed:    The following studies were reviewed today:  EKG:  EKG ordered today and personally reviewed.  The ekg ordered today demonstrates sinus rhythm 60 bpm normal EKG without any ischemic changes  Recent Labs: 10/27/2020: TSH  2.070 11/07/2020: Magnesium 2.0 10/05/2021: ALT 11; BUN 10; Creatinine, Ser 0.93; Hemoglobin 13.7; Platelets 205.0; Potassium 3.9; Sodium 141  Recent Lipid Panel    Component Value Date/Time   CHOL 137 08/06/2021 1453   TRIG 194 (H) 08/06/2021 1453   HDL 44 08/06/2021 1453   CHOLHDL 3.1 08/06/2021 1453   CHOLHDL 4.6 10/27/2020 0050   VLDL 16 10/27/2020 0050   LDLCALC 61 08/06/2021 1453   LDLDIRECT 179.7 02/10/2012 0920    Physical Exam:    VS:  BP 112/66   Pulse 60   Ht '5\' 5"'$  (1.651 m)   Wt 193 lb 1.3 oz (87.6 kg)   SpO2 95%   BMI 32.13 kg/m     Wt Readings from Last 3 Encounters:  10/25/21 193 lb 1.3 oz (87.6 kg)  10/05/21 191 lb 12.8 oz (87 kg)  08/09/21 197 lb 6.4 oz (89.5 kg)     GEN:  Well nourished, well developed in no acute distress HEENT: Normal NECK: No JVD; No carotid bruits LYMPHATICS: No lymphadenopathy CARDIAC: RRR, no murmurs, rubs, gallops RESPIRATORY:  Clear to auscultation without rales, wheezing or rhonchi  ABDOMEN: Soft, non-tender, non-distended MUSCULOSKELETAL:  No edema; No deformity  SKIN: Warm and dry NEUROLOGIC:  Alert and oriented x 3 PSYCHIATRIC:  Normal affect    Signed, Shirlee More, MD  10/25/2021 9:46 AM    [place]

## 2021-10-25 ENCOUNTER — Encounter: Payer: Self-pay | Admitting: Cardiology

## 2021-10-25 ENCOUNTER — Ambulatory Visit: Payer: Medicare HMO | Admitting: Cardiology

## 2021-10-25 ENCOUNTER — Encounter: Payer: Self-pay | Admitting: Internal Medicine

## 2021-10-25 ENCOUNTER — Ambulatory Visit: Payer: Medicare HMO | Admitting: Internal Medicine

## 2021-10-25 VITALS — BP 112/66 | HR 60 | Ht 65.0 in | Wt 193.1 lb

## 2021-10-25 VITALS — BP 120/68 | HR 67 | Ht 65.0 in | Wt 195.0 lb

## 2021-10-25 DIAGNOSIS — I25118 Atherosclerotic heart disease of native coronary artery with other forms of angina pectoris: Secondary | ICD-10-CM | POA: Diagnosis not present

## 2021-10-25 DIAGNOSIS — I10 Essential (primary) hypertension: Secondary | ICD-10-CM

## 2021-10-25 DIAGNOSIS — E7800 Pure hypercholesterolemia, unspecified: Secondary | ICD-10-CM

## 2021-10-25 DIAGNOSIS — Z9889 Other specified postprocedural states: Secondary | ICD-10-CM

## 2021-10-25 DIAGNOSIS — R1110 Vomiting, unspecified: Secondary | ICD-10-CM | POA: Diagnosis not present

## 2021-10-25 DIAGNOSIS — R5383 Other fatigue: Secondary | ICD-10-CM | POA: Diagnosis not present

## 2021-10-25 DIAGNOSIS — R931 Abnormal findings on diagnostic imaging of heart and coronary circulation: Secondary | ICD-10-CM | POA: Diagnosis not present

## 2021-10-25 DIAGNOSIS — R5381 Other malaise: Secondary | ICD-10-CM

## 2021-10-25 DIAGNOSIS — R12 Heartburn: Secondary | ICD-10-CM

## 2021-10-25 DIAGNOSIS — R1319 Other dysphagia: Secondary | ICD-10-CM | POA: Diagnosis not present

## 2021-10-25 NOTE — Progress Notes (Signed)
[name] 76 y.o. 02/13/1946 [id number]  Assessment & Plan:   Encounter Diagnoses  Name Primary?   Heartburn Yes   Esophageal dysphagia    Regurgitation of food    History of Nissen fundoplication    This sounds like her wrap could have slipped.  I did ask her and she had some sort of illness with a lot of coughing about a month ago preceding this.  She has some chronic cough issues as well but it sounds like this was different.  I suppose that could have triggered the wrap slipping.  Some of her other symptoms including no heartburn symptoms could be dysmotility.  Evaluate with barium swallow and EGD. The risks and benefits as well as alternatives of endoscopic procedure(s) have been discussed and reviewed. All questions answered. The patient agrees to proceed.       Subjective:   Chief Complaint:Dysphagia and reflux  HPI 76 year old white woman status post Nissen fundoplication years ago who comes in complaining of dysphagia to solid foods, liquids and pills at varying times.  She is also having regurgitation and reflux/heartburn symptoms particularly if she lies down or bends over.  This is new in the past several months.  She did have some pressure sensation in the esophagus area and a question of dysphagia last year and had a barium swallow ordered by Nicoletta Ba PA-C and that showed an intact fundoplication and signs of esophageal dysmotility with tertiary contractions.  She also has some chronic cough issues may be due to postnasal drip though there is been some question as to whether or not it might be related to reflux she and I both think unlikely.  She had a surveillance colonoscopy in August 2022 with a history of adenomas and SSP, she had an SSP and diminutive adenoma in August 2022 and given MU Y TH mutation we will consider a repeat colonoscopy in 2027 depending upon health and functional status. Allergies  Allergen Reactions   Other Anaphylaxis and Swelling     Glue (eye lash glue, gorilla glue)   Silodosin Rash    Facial rash   Ace Inhibitors Other (See Comments)    REACTION: angioedema   Buprenorphine Hcl Other (See Comments)    "crazy"   Morphine And Related Other (See Comments)    "crazy" "crazy"   Codeine Rash   Sulfonamide Derivatives Rash   Telmisartan Rash   Current Meds  Medication Sig   amLODipine (NORVASC) 5 MG tablet Take 1 tablet (5 mg total) by mouth daily.   aspirin EC 81 MG tablet Take 1 tablet (81 mg total) by mouth daily. Swallow whole.   citalopram (CELEXA) 20 MG tablet TAKE ONE TABLET BY MOUTH EVERY MORNING   isosorbide mononitrate (IMDUR) 30 MG 24 hr tablet Take 30 mg by mouth daily.   pregabalin (LYRICA) 50 MG capsule Take 50 mg by mouth 2 (two) times daily.   Past Medical History:  Diagnosis Date   Allergy    Ankle effusion, left 03/23/2020   Arthritis    "spine" (07/28/2014)   Blood dyscrasia    bruises and bleed easily   Breast cancer, left breast ([place]) 07/28/2014   Breast cancer, right breast ([place]) [place], ABNORMAL 01/04/2008   Qualifier: Diagnosis of  By: Plotnikov MD, Evie Lacks    Chronic back pain    "all over"   Chronic cough 12/07/2007   Could be related to GERD - relapsed    COAGULOPATHY 07/12/2008  Qualifier: Diagnosis of  By: Nils Pyle CMA Deborra Medina), Leisha     Complication of anesthesia    went to sleep easily but hard to wake up up until elbow OR in 2010   DDD (degenerative disc disease), cervical 07/09/2017   DDD (degenerative disc disease), lumbar 07/09/2017   With spinal stenosis.   Dr. Trenton Gammon   Depression    Dizziness 10/27/2020   Dyspnea    Normal Spirometry 03/2008 EF 65% BNP normal 11/2007   Episode of recurrent major depressive disorder ([place]) 12/05/2017   Essential hypertension 02/27/2007   Mild Off meds 2013  BP Readings from Last 3 Encounters:  02/10/12 128/90  08/09/11 134/62  03/06/11 150/86      Family history of breast cancer    Family history of colon cancer     Family history of pancreatic cancer    Fibromyalgia 02/27/2007   Chronic    Gastric polyps    GERD 02/27/2007   S/p Nissen's 2010    GERD (gastroesophageal reflux disease)    History of chicken pox    History of hiatal hernia    Hx of adenomatous polyp of colon 07/03/2015   Kidney stone    right kidney    Lichen sclerosus    Malignant neoplasm of upper-outer quadrant of left breast in female, estrogen receptor positive ([place]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   Medicare annual wellness visit, subsequent 06/30/2016   Melanoma ([place]) 2010   "right elbow; treated at [facility]"   Melanoma of upper arm ([place]) 02/10/2012   2010 R  Elbow ([facility])   Mild anxiety    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   Peripheral edema 11/26/2016   Peripheral neuropathy 01/01/2013   Primary osteoarthritis of both hands 07/09/2017   Primary osteoarthritis of both knees 07/09/2017   Chondromalacia patella   Rheumatoid arthritis ([place]) 11/02/2018   S/P laparoscopic fundoplication 10/62/6948   Steatosis of liver 12/07/2009   Qualifier: Diagnosis of  By: Surface RN, Donna     Viral URI with cough 09/11/2014   Vitamin B12 deficiency    VITAMIN B12 DEFICIENCY 02/27/2007   Chronic    Vitamin D deficiency    Past Surgical History:  Procedure Laterality Date   BREAST BIOPSY Left 04/2014   BREAST RECONSTRUCTION WITH PLACEMENT OF TISSUE EXPANDER AND FLEX HD (ACELLULAR HYDRATED DERMIS) Left 07/28/2014   Procedure: LEFT BREAST RECONSTRUCTION PLACEMENT OF LEFT TISSUE EXPANDER ;  Surgeon: Crissie Reese, MD;  Location: [place];  Service: Plastics;  Laterality: Left;   BREAST RECONSTRUCTION WITH PLACEMENT OF TISSUE EXPANDER AND FLEX HD (ACELLULAR HYDRATED DERMIS) Left 09/08/2014   Procedure: REMOVAL OF TISSUE EXPANDER FROM LEFT BREAST;  Surgeon: Crissie Reese, MD;  Location: [place];  Service: Plastics;  Laterality: Left;   BUNIONECTOMY Bilateral 1970's   COLONOSCOPY     Dr Sharlett Iles   CYSTOSCOPY WITH RETROGRADE PYELOGRAM, URETEROSCOPY AND STENT  PLACEMENT Right 06/09/2015   Procedure: CYSTOSCOPY WITH RIGHT RETROGRADE PYELOGRAM, RIGHT URETEROSCOPY AND RIGHT URTERAL STENT PLACEMENT;  Surgeon: Ardis Hughs, MD;  [facility];  Service: Urology;  Laterality: Right;   ESOPHAGOGASTRODUODENOSCOPY     HERNIA REPAIR     HOLMIUM LASER APPLICATION Right 5/46/2703   Procedure: HOLMIUM LASER APPLICATION;  Surgeon: Ardis Hughs, MD;  [facility];  Service: Urology;  Laterality: Right;   LATISSIMUS FLAP TO BREAST Left 09/08/2014   Procedure: LEFT LATISSIMUS FLAP TO BREAST WITH SALINE IMPLANT FOR BREAST RECONSTRUCTION;  Surgeon: Crissie Reese, MD;  Location: [place];  Service:  Plastics;  Laterality: Left;   LUMBAR FUSION  01/2018   L5-S1 transitional segmental laminectomy and Fusion   MASTECTOMY Right 1996    chemotherapy. pt. states 13 lymph nodes were removed   MASTECTOMY COMPLETE / SIMPLE W/ SENTINEL NODE BIOPSY Left 07/28/2014   MASTECTOMY W/ SENTINEL NODE BIOPSY Left 07/28/2014   Procedure: LEFT MASTECTOMY WITH SENTINEL LYMPH NODE MAPPING;  Surgeon: Autumn Messing III, MD;  Location: [place];  Service: General;  Laterality: Left;   MELANOMA EXCISION Right 2010   From elbow-- Done at [place]  26/3785   RECONSTRUCTION BREAST IMMEDIATE / DELAYED W/ TISSUE EXPANDER Left 07/28/2014   TEMPOROMANDIBULAR JOINT SURGERY Bilateral 1987   TONSILLECTOMY     Social History   Social History Narrative   She lives with husband.     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   Review of Systems As above  Objective:   Physical Exam BP 120/68   Pulse 67   Ht '5\' 5"'$  (1.651 m)   Wt 195 lb (88.5 kg)   SpO2 97%   BMI 32.45 kg/m  Obese ww NAD Lungs cta Cor NL S1s2 no rmg

## 2021-10-25 NOTE — Patient Instructions (Signed)
Medication Instructions:  Your physician has recommended you make the following change in your medication:   Stop: Bystolic  *If you need a refill on your cardiac medications before your next appointment, please call your pharmacy*   Lab Work: None If you have labs (blood work) drawn today and your tests are completely normal, you will receive your results only by: [facility] [place] (if you have MyChart) OR A paper copy in the mail If you have any lab test that is abnormal or we need to change your treatment, we will call you to review the results.   Testing/Procedures: None   Follow-Up: At [facility], you and your health needs are our priority.  As part of our continuing mission to provide you with exceptional heart care, we have created designated Provider Care Teams.  These Care Teams include your primary Cardiologist (physician) and Advanced Practice Providers (APPs -  Physician Assistants and Nurse Practitioners) who all work together to provide you with the care you need, when you need it.  We recommend signing up for the patient portal called "MyChart".  Sign up information is provided on this After Visit Summary.  MyChart is used to connect with patients for Virtual Visits (Telemedicine).  Patients are able to view lab/test results, encounter notes, upcoming appointments, etc.  Non-urgent messages can be sent to your provider as well.   To learn more about what you can do with MyChart, go to [email or web address].    Your next appointment:   6 month(s)  The format for your next appointment:   In Person  Provider:   Shirlee More, MD    Other Instructions None  Important Information About Sugar          [facility]  Tips to measure your blood pressure correctly  To determine whether you have hypertension, a medical professional will take a blood pressure reading. How you prepare for the test, the position of your arm, and other factors can change a blood  pressure reading by 10% or more. That could be enough to hide high blood pressure, start you on a drug you don't really need, or lead your doctor to incorrectly adjust your medications. National and international guidelines offer specific instructions for measuring blood pressure. If a doctor, nurse, or medical assistant isn't doing it right, don't hesitate to ask him or her to get with the guidelines. Here's what you can do to ensure a correct reading:  Don't drink a caffeinated beverage or smoke during the 30 minutes before the test.  Sit quietly for five minutes before the test begins.  During the measurement, sit in a chair with your feet on the floor and your arm supported so your elbow is at about heart level.  The inflatable part of the cuff should completely cover at least 80% of your upper arm, and the cuff should be placed on bare skin, not over a shirt.  Don't talk during the measurement.  Have your blood pressure measured twice, with a brief break in between. If the readings are different by 5 points or more, have it done a third time. There are times to break these rules. If you sometimes feel lightheaded when getting out of bed in the morning or when you stand after sitting, you should have your blood pressure checked while seated and then while standing to see if it falls from one position to the next. Because blood pressure varies throughout the day, your doctor will rarely diagnose hypertension on the basis  of a single reading. Instead, he or she will want to confirm the measurements on at least two occasions, usually within a few weeks of one another. The exception to this rule is if you have a blood pressure reading of 180/110 mm Hg or higher. A result this high usually calls for prompt treatment. It's also a good idea to have your blood pressure measured in both arms at least once, since the reading in one arm (usually the right) may be higher than that in the left. A 2014 study in The  American Journal of Medicine of nearly 3,400 people found average arm- to-arm differences in systolic blood pressure of about 5 points. The higher number should be used to make treatment decisions. In 2017, new guidelines from the [place], the [employer] of Cardiology, and nine other health organizations lowered the diagnosis of high blood pressure to 130/80 mm Hg or higher for all adults. The guidelines also redefined the various blood pressure categories to now include normal, elevated, Stage 1 hypertension, Stage 2 hypertension, and hypertensive crisis (see "Blood pressure categories"). Blood pressure categories  Blood pressure category SYSTOLIC (upper number)  DIASTOLIC (lower number)  Normal Less than 120 mm Hg and Less than 80 mm Hg  Elevated 120-129 mm Hg and Less than 80 mm Hg  High blood pressure: Stage 1 hypertension 130-139 mm Hg or 80-89 mm Hg  High blood pressure: Stage 2 hypertension 140 mm Hg or higher or 90 mm Hg or higher  Hypertensive crisis (consult your doctor immediately) Higher than 180 mm Hg and/or Higher than 120 mm Hg  Source: American Heart Association and American Stroke Association. For more on getting your blood pressure under control, buy Controlling Your Blood Pressure, a Special Health Report from [facility].

## 2021-10-25 NOTE — Patient Instructions (Signed)
You have been scheduled for an endoscopy. Please follow written instructions given to you at your visit today. If you use inhalers (even only as needed), please bring them with you on the day of your procedure.  You will be contacted by [facility] in the next 2 days to arrange a Barium Swallow with tablet..  The number on your caller ID will be [phone], please answer when they call.  If you have not heard from them in 2 days please call [phone] to schedule.     I appreciate the opportunity to care for you. Silvano Rusk, MD, [facility]

## 2021-10-25 NOTE — Addendum Note (Signed)
Addended by: Edwyna Shell I on: 10/25/2021 01:03 PM   Modules accepted: Orders

## 2021-11-08 ENCOUNTER — Ambulatory Visit: Payer: Medicare HMO | Admitting: Pharmacist

## 2021-11-08 VITALS — BP 146/85 | HR 71

## 2021-11-08 DIAGNOSIS — I25118 Atherosclerotic heart disease of native coronary artery with other forms of angina pectoris: Secondary | ICD-10-CM

## 2021-11-08 DIAGNOSIS — R931 Abnormal findings on diagnostic imaging of heart and coronary circulation: Secondary | ICD-10-CM | POA: Diagnosis not present

## 2021-11-08 NOTE — Patient Instructions (Signed)
It was nice meeting you today  We would like to keep your LDL (bad cholesterol) less than 70  Since you can not take the statins, the next medication we recommend is called either Repatha or Praluent which you will inject once every 2 weeks  Once you start the medication we will recheck your lab work in about 2-3 months  I will complete the prior authorization for you and contact you when it is approved  Please call with any questions!  [name], PharmD, BCACP, [place], [place], [place], [place], [address] Phone: [phone], Fax: [phone]

## 2021-11-08 NOTE — Progress Notes (Signed)
Patient ID: [name]                 DOB: 05/23/1946                    MRN: [id number]     HPI: [name] is a 76 y.o. female patient referred to lipid clinic by Dr Bettina Gavia. PMH is significant for HTN, breast cancer, and elevated coronary calcium score.   Patient presents today in good spirits. Reports occasional SOB after physical activity and then will have to sit down and recover.  Has tried 2 high intensity statin in the past.  Both rosuvastatin and atorvastatin caused fatigue.   Reports father had MI.  Dr Bettina Gavia had tried to place patient on Nexletol however the prior authorization was denied by insurance plan. She does have elevated coronary calcium score.   Patient has made heart healthy diet changes recently and LDL has actually reduced from 100 in 2022 to 61 in 2023.  Current Medications: N/A  Intolerances:  Atorvastatin (fatigue) Rosuvastatin (fatigue)  Risk Factors:  Elevated coronary Calcium CAD Family history  LDL goal: <70   Labs: TC 137, Trigs 194, HDL 44, LDL 61 (not on any lipid lowering treatment - 08/06/21)  Coronary artery calcium score 63 Agatston units. This places the patient in the 55th percentile for age and gender, suggesting intermediate risk.  Past Medical History:  Diagnosis Date   Allergy    Ankle effusion, left 03/23/2020   Arthritis    "spine" (07/28/2014)   Blood dyscrasia    bruises and bleed easily   Breast cancer, left breast ([place]) 07/28/2014   Breast cancer, right breast ([place]) [place], ABNORMAL 01/04/2008   Qualifier: Diagnosis of  By: Plotnikov MD, Evie Lacks    Chronic back pain    "all over"   Chronic cough 12/07/2007   Could be related to GERD - relapsed    COAGULOPATHY 07/12/2008   Qualifier: Diagnosis of  By: Nils Pyle CMA (AAMA), Leisha     Complication of anesthesia    went to sleep easily but hard to wake up up until elbow OR in 2010   DDD (degenerative disc disease), cervical 07/09/2017   DDD  (degenerative disc disease), lumbar 07/09/2017   With spinal stenosis.   Dr. Trenton Gammon   Depression    Dizziness 10/27/2020   Dyspnea    Normal Spirometry 03/2008 EF 65% BNP normal 11/2007   Episode of recurrent major depressive disorder ([place]) 12/05/2017   Essential hypertension 02/27/2007   Mild Off meds 2013  BP Readings from Last 3 Encounters:  02/10/12 128/90  08/09/11 134/62  03/06/11 150/86      Family history of breast cancer    Family history of colon cancer    Family history of pancreatic cancer    Fibromyalgia 02/27/2007   Chronic    Gastric polyps    GERD 02/27/2007   S/p Nissen's 2010    GERD (gastroesophageal reflux disease)    History of chicken pox    History of hiatal hernia    Hx of adenomatous polyp of colon 07/03/2015   Kidney stone    right kidney    Lichen sclerosus    Malignant neoplasm of upper-outer quadrant of left breast in female, estrogen receptor positive ([place]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   Medicare annual wellness visit, subsequent 06/30/2016   Melanoma ([place]) 2010   "right elbow; [facility]"   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 R  Elbow ([facility])   Mild anxiety    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   Peripheral edema 11/26/2016   Peripheral neuropathy 01/01/2013   Primary osteoarthritis of both hands 07/09/2017   Primary osteoarthritis of both knees 07/09/2017   Chondromalacia patella   Rheumatoid arthritis ([place]) 11/02/2018   S/P laparoscopic fundoplication 42/59/5638   Steatosis of liver 12/07/2009   Qualifier: Diagnosis of  By: [place] RN, Butch Penny     Viral URI with cough 09/11/2014   Vitamin B12 deficiency    VITAMIN B12 DEFICIENCY 02/27/2007   Chronic    Vitamin D deficiency     Current Outpatient Medications on File Prior to Visit  Medication Sig Dispense Refill   amLODipine (NORVASC) 5 MG tablet Take 1 tablet (5 mg total) by mouth daily. 90 tablet 3   citalopram (CELEXA) 20 MG tablet TAKE ONE TABLET BY MOUTH EVERY MORNING 90 tablet  1   isosorbide mononitrate (IMDUR) 30 MG 24 hr tablet Take 30 mg by mouth daily.     pregabalin (LYRICA) 50 MG capsule Take 50 mg by mouth 2 (two) times daily.     No current facility-administered medications on file prior to visit.    Allergies  Allergen Reactions   Other Anaphylaxis and Swelling    Glue (eye lash glue, gorilla glue)   Silodosin Rash    Facial rash   Ace Inhibitors Other (See Comments)    REACTION: angioedema   Buprenorphine Hcl Other (See Comments)    "crazy"   Morphine And Related Other (See Comments)    "crazy" "crazy"   Codeine Rash   Sulfonamide Derivatives Rash   Telmisartan Rash    Assessment/Plan:  1. Hyperlipidemia - Patient current LDL 61 which is at goal of <70. However patient would benefit from lipid treatment due to coronary calcium.  Since she was denied for Nexletol and can not take statins, recommend trying PCSK9i.  Using demo pen, educated patient on mechanism of action, storage, site selection, administration and possible adverse effects. Patient voiced understanding.  Will complete PA and contact patient when approved.  Recheck lipid panel in 2-3 months.  [name], PharmD, BCACP, [place], [place], [place], [place], [address] Phone: [phone], Fax: [phone]

## 2021-11-12 ENCOUNTER — Encounter: Payer: Self-pay | Admitting: Pharmacist

## 2021-11-12 ENCOUNTER — Telehealth: Payer: Self-pay | Admitting: Pharmacist

## 2021-11-12 ENCOUNTER — Ambulatory Visit (HOSPITAL_COMMUNITY)
Admission: RE | Admit: 2021-11-12 | Discharge: 2021-11-12 | Disposition: A | Discharge status: Home / Self Care | Payer: Medicare HMO | Source: Ambulatory Visit | Attending: Internal Medicine | Admitting: Internal Medicine

## 2021-11-12 DIAGNOSIS — E7800 Pure hypercholesterolemia, unspecified: Secondary | ICD-10-CM

## 2021-11-12 DIAGNOSIS — R1319 Other dysphagia: Secondary | ICD-10-CM | POA: Diagnosis not present

## 2021-11-12 DIAGNOSIS — R931 Abnormal findings on diagnostic imaging of heart and coronary circulation: Secondary | ICD-10-CM

## 2021-11-12 DIAGNOSIS — K224 Dyskinesia of esophagus: Secondary | ICD-10-CM | POA: Diagnosis not present

## 2021-11-12 DIAGNOSIS — R1110 Vomiting, unspecified: Secondary | ICD-10-CM | POA: Diagnosis not present

## 2021-11-12 DIAGNOSIS — I25118 Atherosclerotic heart disease of native coronary artery with other forms of angina pectoris: Secondary | ICD-10-CM

## 2021-11-12 DIAGNOSIS — K449 Diaphragmatic hernia without obstruction or gangrene: Secondary | ICD-10-CM | POA: Diagnosis not present

## 2021-11-12 MED ORDER — PRALUENT 75 MG/ML ~~LOC~~ SOAJ: 1.0000 mL | SUBCUTANEOUS | 1 refills | Status: DC

## 2021-11-12 NOTE — Telephone Encounter (Signed)
PA for Praluent approved through 05/26/22. Contacted patient and LMOM.

## 2021-11-13 ENCOUNTER — Encounter: Payer: Self-pay | Admitting: Pharmacist

## 2021-11-13 DIAGNOSIS — I25118 Atherosclerotic heart disease of native coronary artery with other forms of angina pectoris: Secondary | ICD-10-CM

## 2021-11-13 DIAGNOSIS — E7800 Pure hypercholesterolemia, unspecified: Secondary | ICD-10-CM

## 2021-11-13 MED ORDER — PRALUENT 75 MG/ML ~~LOC~~ SOAJ: 1.0000 mL | SUBCUTANEOUS | 1 refills | Status: DC

## 2021-11-13 NOTE — Addendum Note (Signed)
Addended by: Rollen Sox on: 11/13/2021 01:50 PM   Modules accepted: Orders

## 2021-11-15 DIAGNOSIS — H2513 Age-related nuclear cataract, bilateral: Secondary | ICD-10-CM | POA: Diagnosis not present

## 2021-11-15 DIAGNOSIS — H53481 Generalized contraction of visual field, right eye: Secondary | ICD-10-CM | POA: Diagnosis not present

## 2021-11-15 DIAGNOSIS — H47531 Disorders of visual pathways in (due to) vascular disorders, right side: Secondary | ICD-10-CM | POA: Diagnosis not present

## 2021-11-15 DIAGNOSIS — H25013 Cortical age-related cataract, bilateral: Secondary | ICD-10-CM | POA: Diagnosis not present

## 2021-11-20 ENCOUNTER — Encounter: Payer: Self-pay | Admitting: Internal Medicine

## 2021-11-20 ENCOUNTER — Ambulatory Visit (AMBULATORY_SURGERY_CENTER): Payer: Medicare HMO | Admitting: Internal Medicine

## 2021-11-20 VITALS — BP 160/75 | HR 71 | Temp 97.5°F | Resp 19 | Ht 65.0 in | Wt 195.0 lb

## 2021-11-20 DIAGNOSIS — K259 Gastric ulcer, unspecified as acute or chronic, without hemorrhage or perforation: Secondary | ICD-10-CM | POA: Diagnosis not present

## 2021-11-20 DIAGNOSIS — K2210 Ulcer of esophagus without bleeding: Secondary | ICD-10-CM

## 2021-11-20 DIAGNOSIS — R1310 Dysphagia, unspecified: Secondary | ICD-10-CM | POA: Diagnosis not present

## 2021-11-20 DIAGNOSIS — R12 Heartburn: Secondary | ICD-10-CM

## 2021-11-20 DIAGNOSIS — K2100 Gastro-esophageal reflux disease with esophagitis, without bleeding: Secondary | ICD-10-CM | POA: Diagnosis not present

## 2021-11-20 DIAGNOSIS — R1319 Other dysphagia: Secondary | ICD-10-CM

## 2021-11-20 DIAGNOSIS — K209 Esophagitis, unspecified without bleeding: Secondary | ICD-10-CM

## 2021-11-20 MED ORDER — SODIUM CHLORIDE 0.9 % IV SOLN: 500.0000 mL | Freq: Once | INTRAVENOUS | Status: DC

## 2021-11-20 MED ORDER — OMEPRAZOLE 40 MG PO CPDR: 40.0000 mg | DELAYED_RELEASE_CAPSULE | Freq: Every day | ORAL | 3 refills | Status: DC

## 2021-11-20 NOTE — Progress Notes (Signed)
Vitals-CW  Pt's states no medical or surgical changes since previsit or office visit.   History was reviewed anyway.

## 2021-11-20 NOTE — Progress Notes (Signed)
History and Physical Interval Note:  11/20/2021 4:11 PM  [name]  has presented today for endoscopic procedure(s), with the diagnosis of  Encounter Diagnoses  Name Primary?   Heartburn Yes   Esophageal dysphagia   .  The various methods of evaluation and treatment have been discussed with the patient and/or family. After consideration of risks, benefits and other options for treatment, the patient has consented to  the endoscopic procedure(s).  Recent ba swallow IMPRESSION: 1. Focal area of narrowing in the upper esophagus without evidence of obstruction. The 13 mm barium tablet passes through this segment.   2.  Moderate-severe esophageal dysmotility.   3. Recurrent, small hiatal hernia status post Nissen fundoplication.     Electronically Signed   By: Kerby Moors M.D.   On: 11/12/2021 13:02     The patient's history has been reviewed, patient examined, no change in status, stable for endoscopic procedure(s).  I have reviewed the patient's chart and labs.  Questions were answered to the patient's satisfaction.     [name] Mayer, MD, [facility]

## 2021-11-20 NOTE — Progress Notes (Signed)
Called to room to assist during endoscopic procedure.  Patient ID and intended procedure confirmed with present staff. Received instructions for my participation in the procedure from the performing physician.  

## 2021-11-21 ENCOUNTER — Telehealth: Payer: Self-pay

## 2021-11-21 NOTE — Telephone Encounter (Signed)
Left message

## 2021-11-28 ENCOUNTER — Other Ambulatory Visit: Payer: Self-pay | Admitting: Cardiology

## 2021-12-18 ENCOUNTER — Other Ambulatory Visit: Payer: Self-pay | Admitting: Cardiology

## 2021-12-18 ENCOUNTER — Other Ambulatory Visit: Payer: Self-pay | Admitting: Family

## 2021-12-18 DIAGNOSIS — I25118 Atherosclerotic heart disease of native coronary artery with other forms of angina pectoris: Secondary | ICD-10-CM

## 2021-12-18 DIAGNOSIS — F419 Anxiety disorder, unspecified: Secondary | ICD-10-CM

## 2021-12-18 DIAGNOSIS — R609 Edema, unspecified: Secondary | ICD-10-CM

## 2021-12-26 ENCOUNTER — Ambulatory Visit: Payer: Medicare HMO | Admitting: Orthopaedic Surgery

## 2021-12-26 ENCOUNTER — Encounter: Payer: Self-pay | Admitting: Orthopaedic Surgery

## 2021-12-26 ENCOUNTER — Ambulatory Visit (INDEPENDENT_AMBULATORY_CARE_PROVIDER_SITE_OTHER): Payer: Medicare HMO

## 2021-12-26 VITALS — Ht 65.0 in | Wt 188.6 lb

## 2021-12-26 DIAGNOSIS — M25562 Pain in left knee: Secondary | ICD-10-CM

## 2021-12-26 DIAGNOSIS — M1712 Unilateral primary osteoarthritis, left knee: Secondary | ICD-10-CM | POA: Diagnosis not present

## 2021-12-26 MED ORDER — BUPIVACAINE HCL 0.25 % IJ SOLN
22.0000 mL | INTRAMUSCULAR | Status: AC | PRN
  Administered 2021-12-26: 22 mL via INTRA_ARTICULAR

## 2021-12-26 MED ORDER — LIDOCAINE HCL 1 % IJ SOLN
2.0000 mL | INTRAMUSCULAR | Status: AC | PRN
  Administered 2021-12-26: 2 mL

## 2021-12-26 MED ORDER — METHYLPREDNISOLONE ACETATE 40 MG/ML IJ SUSP
80.0000 mg | INTRAMUSCULAR | Status: AC | PRN
  Administered 2021-12-26: 80 mg via INTRA_ARTICULAR

## 2021-12-26 NOTE — Progress Notes (Signed)
Office Visit Note   Patient: [name]           Date of Birth: 06-18-1945           MRN: [id number] Visit Date: 12/26/2021              Requested by: Marrian Salvage, [address] [place],  [place] [address] PCP: Marrian Salvage, FNP   Assessment & Plan: Visit Diagnoses:  1. Acute pain of left knee   2. Arthritis of left knee     Plan: [name] experienced cute onset of left knee pain approximately 3 weeks ago.  She was walking down into amphitheater when she experienced pain along the medial aspect of her knee.  She had minimal trauma.  She had some swelling along the medial aspect of her knee which slowly has resolved but still having a fair amount of pain along the medial aspect of her knee.  Has had some mild "arthritic pains in the past" but nothing of any significance.  She is not having any instability but still feels discomfort along the medial aspect of her knee.  Films demonstrate tricompartmental degenerative changes but joint spaces are well-maintained.  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  Will inject the medial aspect of her knee with cortisone and monitor response  Follow-Up Instructions: Return if symptoms worsen or fail to improve.   Orders:  Orders Placed This Encounter  Procedures   Large Joint Inj: L knee   XR KNEE 3 VIEW LEFT   No orders of the defined types were placed in this encounter.     Procedures: Large Joint Inj: L knee on 12/26/2021 10:57 AM Indications: pain and diagnostic evaluation Details: 25 G 1.5 in needle, anteromedial approach  Arthrogram: No  Medications: 2 mL lidocaine 1 %; 80 mg methylPREDNISolone acetate 40 MG/ML; 22 mL bupivacaine 0.25 % Procedure, treatment alternatives, risks and benefits explained, specific risks discussed. Consent was given by the patient. Patient was prepped and draped in the usual sterile fashion.       Clinical Data: No additional findings.   Subjective: Chief  Complaint  Patient presents with   Left Knee - New Patient (Initial Visit)   Patient presents today as a new patient for her left knee pain. She states that pain was noticed around 12/04/2021 when she stepped wrong off of some stairs. She states that he has been having more of a burning pain noticed more while she is sitting. Pain is noticed more on medial side of her left knee.   Review of Systems   Objective: Vital Signs: Ht '5\' 5"'$  (1.651 m)   Wt 188 lb 9.6 oz (85.5 kg)   BMI 31.38 kg/m   Physical Exam Constitutional:      Appearance: She is well-developed.  Eyes:     Pupils: Pupils are equal, round, and reactive to light.  Pulmonary:     Effort: Pulmonary effort is normal.  Skin:    General: Skin is warm and dry.  Neurological:     Mental Status: She is alert and oriented to person, place, and time.  Psychiatric:        Behavior: Behavior normal.     Ortho Exam awake alert and oriented x3.  Comfortable sitting.  Left knee was not hot red warm or swollen.  Little bit of swelling along the medial aspect of her knee with some diffuse mild to moderate joint pain.  No instability.  Full extension of flex to at least 105 degrees.  No popliteal pain or mass.  Specialty Comments:  No specialty comments available.  Imaging: XR KNEE 3 VIEW LEFT  Result Date: 12/26/2021 Films of the left knee were obtained in 3 projections standing.  There are mild tricompartmental degenerative changes with some mild subchondral sclerosis more medial than lateral with some very small peripheral osteophytes.  Joint spaces are relatively well-maintained and alignment appears to be neutral.  Some patellofemoral arthritis with osteophytes but the patella tracks in the midline.  Might have very faint calcification within the menisci consistent with CPPD    PMFS History: Patient Active Problem List   Diagnosis Date Noted   Arthritis of left knee 12/26/2021   Allergy 54/56/2563   Lichen sclerosus  89/37/3428   Dizziness 10/27/2020   Vitamin B12 deficiency    Mild anxiety    Kidney stone    History of hiatal hernia    History of chicken pox    GERD (gastroesophageal reflux disease)    Gastric polyps    Dyspnea    Depression    Complication of anesthesia    Chronic back pain    Blood dyscrasia    Arthritis    Ankle effusion, left 03/23/2020   Rheumatoid arthritis ([facility]) 11/02/2018   Episode of recurrent major depressive disorder ([place]) 12/05/2017   Primary osteoarthritis of both hands 07/09/2017   Primary osteoarthritis of both knees 07/09/2017   DDD (degenerative disc disease), cervical 07/09/2017   DDD (degenerative disc disease), lumbar 07/09/2017   Peripheral edema 11/26/2016   Medicare annual wellness visit, subsequent 06/30/2016   Hx of adenomatous polyp of colon 07/03/2015   Viral URI with cough 76/81/1572   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   Breast cancer, left breast ([place]) 07/28/2014   Family history of breast cancer    Family history of colon cancer    Family history of pancreatic cancer    Malignant neoplasm of upper-outer quadrant of left breast in female, estrogen receptor positive ([place] [facility]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   Peripheral neuropathy 01/01/2013   Melanoma of upper arm ([facility]) 02/10/2012   S/P laparoscopic fundoplication 62/07/5595   Steatosis of liver 12/07/2009   COAGULOPATHY 07/12/2008   Melanoma ([facility]) 2010   CHEST XRAY, ABNORMAL 01/04/2008   Chronic cough 12/07/2007   Vitamin D deficiency 04/14/2007   VITAMIN B12 DEFICIENCY 02/27/2007   Essential hypertension 02/27/2007   GERD 02/27/2007   Fibromyalgia 02/27/2007   Breast cancer, right breast ([facility]) 1995   Past Medical History:  Diagnosis Date   Allergy    Ankle effusion, left 03/23/2020   Arthritis    "spine" (07/28/2014)   Blood dyscrasia    bruises and bleed easily   Breast cancer, left breast ([facility]) 07/28/2014   Breast cancer, right breast ([facility]) [place], ABNORMAL 01/04/2008    Qualifier: Diagnosis of  By: Plotnikov MD, Evie Lacks    Chronic back pain    "all over"   Chronic cough 12/07/2007   Could be related to GERD - relapsed    COAGULOPATHY 07/12/2008   Qualifier: Diagnosis of  By: Nils Pyle CMA (AAMA), Leisha     Complication of anesthesia    went to sleep easily but hard to wake up up until elbow OR in 2010   DDD (degenerative disc disease), cervical 07/09/2017   DDD (degenerative disc disease), lumbar 07/09/2017   With spinal stenosis.   Dr. Trenton Gammon   Depression    Dizziness 10/27/2020  Dyspnea    Normal Spirometry 03/2008 EF 65% BNP normal 11/2007   Episode of recurrent major depressive disorder ([facility]) 12/05/2017   Essential hypertension 02/27/2007   Mild Off meds 2013  BP Readings from Last 3 Encounters:  02/10/12 128/90  08/09/11 134/62  03/06/11 150/86      Family history of breast cancer    Family history of colon cancer    Family history of pancreatic cancer    Fibromyalgia 02/27/2007   Chronic    Gastric polyps    GERD 02/27/2007   S/p Nissen's 2010    GERD (gastroesophageal reflux disease)    History of chicken pox    History of hiatal hernia    Hx of adenomatous polyp of colon 07/03/2015   Kidney stone    right kidney    Lichen sclerosus    Malignant neoplasm of upper-outer quadrant of left breast in female, estrogen receptor positive ([facility]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   Medicare annual wellness visit, subsequent 06/30/2016   Melanoma ([facility]) 2010   "right elbow; [facility]"   Melanoma of upper arm ([facility]) 02/10/2012   2010 R  Elbow ([facility])   Mild anxiety    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   Peripheral edema 11/26/2016   Peripheral neuropathy 01/01/2013   Primary osteoarthritis of both hands 07/09/2017   Primary osteoarthritis of both knees 07/09/2017   Chondromalacia patella   Rheumatoid arthritis ([facility]) 11/02/2018   S/P laparoscopic fundoplication 40/98/1191   Steatosis of liver 12/07/2009   Qualifier: Diagnosis of  By: Surface RN,  Butch Penny     Viral URI with cough 09/11/2014   Vitamin B12 deficiency    VITAMIN B12 DEFICIENCY 02/27/2007   Chronic    Vitamin D deficiency     Family History  Problem Relation Age of Onset   Colon cancer Cousin 1       double first cousin   Colon cancer Cousin 53       double first cousin   Colon polyps Father        between 42-20   Dementia Father    CAD Father        CABG at age 1   Stroke Mother    Pancreatic cancer Brother 56   Colon polyps Brother        between 59-20   Cancer Paternal Uncle        NOS   Anemia Paternal Grandfather        pernicious anemia   Breast cancer Cousin 21       double first cousin   Breast cancer Cousin 57       paternal cousin   Stroke Other        F 1st degree relative 68, M 1st degree relative   Esophageal cancer Neg Hx    Stomach cancer Neg Hx     Past Surgical History:  Procedure Laterality Date   BREAST BIOPSY Left 04/2014   BREAST RECONSTRUCTION WITH PLACEMENT OF TISSUE EXPANDER AND FLEX HD (ACELLULAR HYDRATED DERMIS) Left 07/28/2014   Procedure: LEFT BREAST RECONSTRUCTION PLACEMENT OF LEFT TISSUE EXPANDER ;  Surgeon: Crissie Reese, MD;  Location: [facility];  Service: Plastics;  Laterality: Left;   BREAST RECONSTRUCTION WITH PLACEMENT OF TISSUE EXPANDER AND FLEX HD (ACELLULAR HYDRATED DERMIS) Left 09/08/2014   Procedure: REMOVAL OF TISSUE EXPANDER FROM LEFT BREAST;  Surgeon: Crissie Reese, MD;  Location: [facility];  Service: Plastics;  Laterality: Left;   BUNIONECTOMY Bilateral 1970's   COLONOSCOPY  Dr Sharlett Iles   CYSTOSCOPY WITH RETROGRADE PYELOGRAM, URETEROSCOPY AND STENT PLACEMENT Right 06/09/2015   Procedure: CYSTOSCOPY WITH RIGHT RETROGRADE PYELOGRAM, RIGHT URETEROSCOPY AND RIGHT URTERAL STENT PLACEMENT;  Surgeon: Ardis Hughs, MD;  Location: [facility];  Service: Urology;  Laterality: Right;   ESOPHAGOGASTRODUODENOSCOPY     HERNIA REPAIR     HOLMIUM LASER APPLICATION Right 02/16/3006   Procedure: HOLMIUM LASER APPLICATION;  Surgeon:  Ardis Hughs, MD;  Location: [facility];  Service: Urology;  Laterality: Right;   LATISSIMUS FLAP TO BREAST Left 09/08/2014   Procedure: LEFT LATISSIMUS FLAP TO BREAST WITH SALINE IMPLANT FOR BREAST RECONSTRUCTION;  Surgeon: Crissie Reese, MD;  Location: [facility];  Service: Plastics;  Laterality: Left;   LUMBAR FUSION  01/2018   L5-S1 transitional segmental laminectomy and Fusion   MASTECTOMY Right 1996    chemotherapy. pt. states 13 lymph nodes were removed   MASTECTOMY COMPLETE / SIMPLE W/ SENTINEL NODE BIOPSY Left 07/28/2014   MASTECTOMY W/ SENTINEL NODE BIOPSY Left 07/28/2014   Procedure: LEFT MASTECTOMY WITH SENTINEL LYMPH NODE MAPPING;  Surgeon: Autumn Messing III, MD;  Location: [facility];  Service: General;  Laterality: Left;   MELANOMA EXCISION Right 2010   From elbow-- Done at [facility]  62/2633   RECONSTRUCTION BREAST IMMEDIATE / DELAYED W/ TISSUE EXPANDER Left 07/28/2014   TEMPOROMANDIBULAR JOINT SURGERY Bilateral 1987   TONSILLECTOMY     Social History   Occupational History   Occupation: Freight forwarder  Tobacco Use   Smoking status: Never   Smokeless tobacco: Never   Tobacco comments:    Regular Exercise - Yes  Vaping Use   Vaping Use: Never used  Substance and Sexual Activity   Alcohol use: No   Drug use: No   Sexual activity: Yes

## 2022-02-01 ENCOUNTER — Telehealth: Payer: Self-pay

## 2022-02-01 ENCOUNTER — Ambulatory Visit (INDEPENDENT_AMBULATORY_CARE_PROVIDER_SITE_OTHER): Payer: Medicare HMO | Admitting: Family

## 2022-02-01 ENCOUNTER — Encounter: Payer: Self-pay | Admitting: Family

## 2022-02-01 VITALS — BP 150/90 | HR 81 | Temp 98.2°F | Ht 65.0 in | Wt 189.6 lb

## 2022-02-01 DIAGNOSIS — R296 Repeated falls: Secondary | ICD-10-CM | POA: Diagnosis not present

## 2022-02-01 DIAGNOSIS — R531 Weakness: Secondary | ICD-10-CM | POA: Diagnosis not present

## 2022-02-01 NOTE — Telephone Encounter (Signed)
Spoke with patient regarding fall.  Patient states she fell on concrete, catching herself on her hands/arms. Denies hitting her head.  Patient reports being sore and having a headache. Able to move without discomfort, denies pain.  She states she has h/o stroke a year ago that affected her vision and causes her to be off balance at times.  Patient did not go to ER as recommended by Triage RN, stating she did not feel it was that emergent.   Patient scheduled with PCP today.

## 2022-02-01 NOTE — Telephone Encounter (Signed)
Nurse Assessment Nurse: Rolin Barry, RN, Levada Dy Date/Time Eilene Ghazi Time): 01/31/2022 1:03:18 PM Confirm and document reason for call. If symptomatic, describe symptoms. ---Caller states that she just fell at a church event. Her BP is high currently. Her BP is 154/107. Caller advised that she fell on a cement floor, denies any injury. Advised that a nurse checked. Caller states that she does not feel well. Does the patient have any new or worsening symptoms? ---Yes Will a triage be completed? ---Yes Related visit to physician within the last 2 weeks? ---No Does the PT have any chronic conditions? (i.e. diabetes, asthma, this includes High risk factors for pregnancy, etc.) ---Yes List chronic conditions. ---Takes a diuretic chronic back problems stroke in may , is on a blood thinner Is this a behavioral health or substance abuse call? ---No Guidelines Guideline Title Affirmed Question Affirmed Notes Nurse Date/Time (Eastern Time) Blood Pressure - High [3] Systolic BP >= 845 OR Diastolic >= 364 AND [6] cardiac (e.g., breathing difficulty, chest pain) or neurologic symptoms (e.g., new-onset blurred Deaton, RN, Levada Dy 01/31/2022 1:05:39 PM PLEASE NOTE: All timestamps contained within this report are represented as [place] Standard Time. CONFIDENTIALTY NOTICE: This fax transmission is intended only for the addressee. It contains information that is legally privileged, confidential or otherwise protected from use or disclosure. If you are not the intended recipient, you are strictly prohibited from reviewing, disclosing, copying using or disseminating any of this information or taking any action in reliance on or regarding this information. If you have received this fax in error, please notify us immediately by telephone so that we can arrange for its return to [place]. Phone: [phone], Toll-Free: [phone], Fax: [phone] Page: 2 of 2 Call Id: [id number] Guidelines Guideline Title  Affirmed Question Affirmed Notes Nurse Date/Time Eilene Ghazi Time) or double vision, unsteady gait) Disp. Time Eilene Ghazi Time) Disposition Final User 01/31/2022 1:07:50 PM Go to ED Now Yes Rolin Barry, RN, Levada Dy Final Disposition 01/31/2022 1:07:50 PM Go to ED Now Yes Deaton, RN, Cindee Lame Disagree/Comply Comply Caller Understands Yes PreDisposition Did not know what to do Care Advice Given Per Guideline GO TO ED NOW: * You need to be seen in the Emergency Department. * Go to the ED at ___________ [facility] now. Drive carefully. NOTE TO TRIAGER - DRIVING: * Another adult should drive. * Patient should not delay going to the emergency department. * If immediate transportation is not available via car, rideshare (e.g., Lyft, Uber), or taxi, then the patient should be instructed to call [id number]. CALL EMS 911 IF: * Passes out or faints * Becomes confused * Becomes too weak to stand * You become worse CARE ADVICE given per High Blood Pressure (Adult) guideline. Comments User: Saverio Danker, RN Date/Time Eilene Ghazi Time): 01/31/2022 1:08:39 PM Husband is driving her to the ED NOW. Referrals [facility] - ED

## 2022-02-01 NOTE — Telephone Encounter (Signed)
Appt today w/ PCP.

## 2022-02-01 NOTE — Telephone Encounter (Signed)
Will address the concern at visit today.   FYI to provider.

## 2022-02-01 NOTE — Telephone Encounter (Signed)
I have called pt to gather more information. There was no answer and I left a message to call back. I was wondering if she went to the ED and if so was it outside of [facility]. How is she feeling?  She does have an appointment with Mickel Baas today.

## 2022-02-01 NOTE — Progress Notes (Signed)
[name] is a 76 y.o. female with the following history as recorded in EpicCare:  Patient Active Problem List   Diagnosis Date Noted   Arthritis of left knee 12/26/2021   Allergy 10/17/2021   Lichen sclerosus 12/06/2020   Dizziness 10/27/2020   Vitamin B12 deficiency    Mild anxiety    Kidney stone    History of hiatal hernia    History of chicken pox    GERD (gastroesophageal reflux disease)    Gastric polyps    Dyspnea    Depression    Complication of anesthesia    Chronic back pain    Blood dyscrasia    Arthritis    Ankle effusion, left 03/23/2020   Rheumatoid arthritis (HCC) 11/02/2018   Episode of recurrent major depressive disorder (HCC) 12/05/2017   Primary osteoarthritis of both hands 07/09/2017   Primary osteoarthritis of both knees 07/09/2017   DDD (degenerative disc disease), cervical 07/09/2017   DDD (degenerative disc disease), lumbar 07/09/2017   Peripheral edema 11/26/2016   Medicare annual wellness visit, subsequent 06/30/2016   Hx of adenomatous polyp of colon 07/03/2015   Viral URI with cough 09/11/2014   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   Breast cancer, left breast (HCC) 07/28/2014   Family history of breast cancer    Family history of colon cancer    Family history of pancreatic cancer    Malignant neoplasm of upper-outer quadrant of left breast in female, 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   Peripheral neuropathy 01/01/2013   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   S/P laparoscopic fundoplication 08/09/2011   Steatosis of liver 12/07/2009   COAGULOPATHY 07/12/2008   Melanoma (HCC) 2010   CHEST XRAY, ABNORMAL 01/04/2008   Chronic cough 12/07/2007   Vitamin D deficiency 04/14/2007   VITAMIN B12 DEFICIENCY 02/27/2007   Essential hypertension 02/27/2007   GERD 02/27/2007   Fibromyalgia 02/27/2007   Breast cancer, right breast (HCC) 1995    Current Outpatient Medications  Medication Sig Dispense Refill   Alirocumab  (PRALUENT) 75 MG/ML SOAJ Inject 1 mL into the skin every 14 (fourteen) days. 6 mL 1   amLODipine (NORVASC) 5 MG tablet Take 1 tablet (5 mg total) by mouth daily. 90 tablet 3   citalopram (CELEXA) 20 MG tablet TAKE ONE TABLET BY MOUTH EVERY MORNING 90 tablet 1   furosemide (LASIX) 40 MG tablet TAKE ONE TABLET BY MOUTH EVERY MORNING 90 tablet 1   isosorbide mononitrate (IMDUR) 30 MG 24 hr tablet TAKE ONE TABLET BY MOUTH EVERY EVENING 90 tablet 3   omeprazole (PRILOSEC) 40 MG capsule Take 1 capsule (40 mg total) by mouth daily. 90 capsule 3   pregabalin (LYRICA) 50 MG capsule Take 50 mg by mouth 2 (two) times daily.     No current facility-administered medications for this visit.    Allergies: Other, Silodosin, Ace inhibitors, Buprenorphine hcl, Morphine and related, Codeine, Sulfonamide derivatives, and Telmisartan  Past Medical History:  Diagnosis Date   Allergy    Ankle effusion, left 03/23/2020   Arthritis    "spine" (07/28/2014)   Blood dyscrasia    bruises and bleed easily   Breast cancer, left breast (HCC) 07/28/2014   Breast cancer, right breast (HCC) 1995   CHEST XRAY, ABNORMAL 01/04/2008   Qualifier: Diagnosis of  By: Plotnikov MD, Georgina Quint    Chronic back pain    "all over"   Chronic cough 12/07/2007   Could be related to GERD - relapsed  COAGULOPATHY 07/12/2008   Qualifier: Diagnosis of  By: Nils Pyle CMA (AAMA), Leisha     Complication of anesthesia    went to sleep easily but hard to wake up up until elbow OR in 2010   DDD (degenerative disc disease), cervical 07/09/2017   DDD (degenerative disc disease), lumbar 07/09/2017   With spinal stenosis.   Dr. Trenton Gammon   Depression    Dizziness 10/27/2020   Dyspnea    Normal Spirometry 03/2008 EF 65% BNP normal 11/2007   Episode of recurrent major depressive disorder ([place]) 12/05/2017   Essential hypertension 02/27/2007   Mild Off meds 2013  BP Readings from Last 3 Encounters:  02/10/12 128/90  08/09/11 134/62  03/06/11 150/86       Family history of breast cancer    Family history of colon cancer    Family history of pancreatic cancer    Fibromyalgia 02/27/2007   Chronic    Gastric polyps    GERD 02/27/2007   S/p Nissen's 2010    GERD (gastroesophageal reflux disease)    History of chicken pox    History of hiatal hernia    Hx of adenomatous polyp of colon 07/03/2015   Kidney stone    right kidney    Lichen sclerosus    Malignant neoplasm of upper-outer quadrant of left breast in female, estrogen receptor positive ([place]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   Medicare annual wellness visit, subsequent 06/30/2016   Melanoma ([place]) 2010   "right elbow; treated at [facility]"   Melanoma of upper arm ([place]) 02/10/2012   2010 R  Elbow ([facility])   Mild anxiety    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   Peripheral edema 11/26/2016   Peripheral neuropathy 01/01/2013   Primary osteoarthritis of both hands 07/09/2017   Primary osteoarthritis of both knees 07/09/2017   Chondromalacia patella   Rheumatoid arthritis ([place]) 11/02/2018   S/P laparoscopic fundoplication 25/85/2778   Steatosis of liver 12/07/2009   Qualifier: Diagnosis of  By: Surface RN, Donna     Viral URI with cough 09/11/2014   Vitamin B12 deficiency    VITAMIN B12 DEFICIENCY 02/27/2007   Chronic    Vitamin D deficiency     Past Surgical History:  Procedure Laterality Date   BREAST BIOPSY Left 04/2014   BREAST RECONSTRUCTION WITH PLACEMENT OF TISSUE EXPANDER AND FLEX HD (ACELLULAR HYDRATED DERMIS) Left 07/28/2014   Procedure: LEFT BREAST RECONSTRUCTION PLACEMENT OF LEFT TISSUE EXPANDER ;  Surgeon: Crissie Reese, MD;  Location: [place];  Service: Plastics;  Laterality: Left;   BREAST RECONSTRUCTION WITH PLACEMENT OF TISSUE EXPANDER AND FLEX HD (ACELLULAR HYDRATED DERMIS) Left 09/08/2014   Procedure: REMOVAL OF TISSUE EXPANDER FROM LEFT BREAST;  Surgeon: Crissie Reese, MD;  Location: [place];  Service: Plastics;  Laterality: Left;   BUNIONECTOMY Bilateral 1970's   COLONOSCOPY     Dr  Sharlett Iles   CYSTOSCOPY WITH RETROGRADE PYELOGRAM, URETEROSCOPY AND STENT PLACEMENT Right 06/09/2015   Procedure: CYSTOSCOPY WITH RIGHT RETROGRADE PYELOGRAM, RIGHT URETEROSCOPY AND RIGHT URTERAL STENT PLACEMENT;  Surgeon: Ardis Hughs, MD;  [facility];  Service: Urology;  Laterality: Right;   ESOPHAGOGASTRODUODENOSCOPY     HERNIA REPAIR     HOLMIUM LASER APPLICATION Right 2/42/3536   Procedure: HOLMIUM LASER APPLICATION;  Surgeon: Ardis Hughs, MD;  [facility];  Service: Urology;  Laterality: Right;   LATISSIMUS FLAP TO BREAST Left 09/08/2014   Procedure: LEFT LATISSIMUS FLAP TO BREAST WITH SALINE IMPLANT FOR BREAST RECONSTRUCTION;  Surgeon: Crissie Reese, MD;  Location: [place];  Service: Plastics;  Laterality: Left;   LUMBAR FUSION  01/2018   L5-S1 transitional segmental laminectomy and Fusion   MASTECTOMY Right 1996    chemotherapy. pt. states 13 lymph nodes were removed   MASTECTOMY COMPLETE / SIMPLE W/ SENTINEL NODE BIOPSY Left 07/28/2014   MASTECTOMY W/ SENTINEL NODE BIOPSY Left 07/28/2014   Procedure: LEFT MASTECTOMY WITH SENTINEL LYMPH NODE MAPPING;  Surgeon: Autumn Messing III, MD;  Location: [place];  Service: General;  Laterality: Left;   MELANOMA EXCISION Right 2010   From elbow-- Done at [facility]  75/6433   RECONSTRUCTION BREAST IMMEDIATE / DELAYED W/ TISSUE EXPANDER Left 07/28/2014   TEMPOROMANDIBULAR JOINT SURGERY Bilateral 1987   TONSILLECTOMY      Family History  Problem Relation Age of Onset   Colon cancer Cousin 63       double first cousin   Colon cancer Cousin 53       double first cousin   Colon polyps Father        between 35-20   Dementia Father    CAD Father        CABG at age 63   Stroke Mother    Pancreatic cancer Brother 98   Colon polyps Brother        between 90-20   Cancer Paternal Uncle        NOS   Anemia Paternal Grandfather        pernicious anemia   Breast cancer Cousin 87       double first cousin   Breast  cancer Cousin 79       paternal cousin   Stroke Other        F 1st degree relative 63, M 1st degree relative   Esophageal cancer Neg Hx    Stomach cancer Neg Hx     Social History   Tobacco Use   Smoking status: Never   Smokeless tobacco: Never   Tobacco comments:    Regular Exercise - Yes  Substance Use Topics   Alcohol use: No    Subjective:  Patient was walking yesterday on concrete- had broom/ dustpan in both of her hands; "just lost my balance." Did not hit her head; had to have assistance getting up- "it knocked the breath out of me." Notes that she had had problems with recurrent falls/ misjudging distances "for years."  Was hospitalized in June 2022- CT showed concern for possible acute infarct; follow up MRI/ MRA were all normal- continued confusion about diagnosis from that visit;     Objective:  Vitals:   02/01/22 1450  BP: (!) 150/90  Pulse: 81  Temp: 98.2 F (36.8 C)  TempSrc: Oral  SpO2: 97%  Weight: 189 lb 9.6 oz (86 kg)  Height: _0  (1.651 m)    General: Well developed, well nourished, in no acute distress  Skin : Warm and dry.  Head: Normocephalic and atraumatic  Eyes: Sclera and conjunctiva clear; pupils round and reactive to light; extraocular movements intact  Ears: External normal; canals clear; tympanic membranes normal  Oropharynx: Pink, supple. No suspicious lesions  Neck: Supple without thyromegaly, adenopathy  Lungs: Respirations unlabored; clear to auscultation bilaterally without wheeze, rales, rhonchi  CVS exam: normal rate and regular rhythm.  Neurologic: Alert and oriented; speech intact; face symmetrical; moves all extremities well; CNII-XII intact without focal deficit   Assessment:  1. Recurrent falls   2. Right sided weakness  Plan:  Will update brain MRI; referral to neurology updated; discussed potential benefit of PT but patient wants to discuss further with neurology which is reasonable; order given for cane; will update  labs today; follow up to be determined.   No follow-ups on file.  Orders Placed This Encounter  Procedures   For home use only DME Cane   MR Brain W Wo Contrast    Standing Status:   Future    Standing Expiration Date:   02/02/2023    Order Specific Question:   If indicated for the ordered procedure, I authorize the administration of contrast media per Radiology protocol    Answer:   Yes    Order Specific Question:   What is the patient's sedation requirement?    Answer:   No Sedation    Order Specific Question:   Does the patient have a pacemaker or implanted devices?    Answer:   No    Order Specific Question:   Preferred imaging location?    Answer:   [facility] (table limit 350lbs)   B12   CBC with Differential/Platelet   Magnesium   TSH   Comp Met (CMET)   Ambulatory referral to Neurology    Referral Priority:   Routine    Referral Type:   Consultation    Referral Reason:   Specialty Services Required    Requested Specialty:   Neurology    Number of Visits Requested:   1    Requested Prescriptions    No prescriptions requested or ordered in this encounter

## 2022-02-02 LAB — CBC WITH DIFFERENTIAL/PLATELET
Absolute Monocytes: 465 cells/uL (ref 200–950)
Basophils Absolute: 50 cells/uL (ref 0–200)
Basophils Relative: 0.8 %
Eosinophils Absolute: 217 cells/uL (ref 15–500)
Eosinophils Relative: 3.5 %
HCT: 41.5 % (ref 35.0–45.0)
Hemoglobin: 14 g/dL (ref 11.7–15.5)
Lymphs Abs: 1810 cells/uL (ref 850–3900)
MCH: 31.3 pg (ref 27.0–33.0)
MCHC: 33.7 g/dL (ref 32.0–36.0)
MCV: 92.6 fL (ref 80.0–100.0)
MPV: 11.8 fL (ref 7.5–12.5)
Monocytes Relative: 7.5 %
Neutro Abs: 3658 cells/uL (ref 1500–7800)
Neutrophils Relative %: 59 %
Platelets: 183 10*3/uL (ref 140–400)
RBC: 4.48 10*6/uL (ref 3.80–5.10)
RDW: 12.8 % (ref 11.0–15.0)
Total Lymphocyte: 29.2 %
WBC: 6.2 10*3/uL (ref 3.8–10.8)

## 2022-02-02 LAB — COMPREHENSIVE METABOLIC PANEL
AG Ratio: 1.7 (calc) (ref 1.0–2.5)
ALT: 15 U/L (ref 6–29)
AST: 18 U/L (ref 10–35)
Albumin: 4.1 g/dL (ref 3.6–5.1)
Alkaline phosphatase (APISO): 70 U/L (ref 37–153)
BUN: 15 mg/dL (ref 7–25)
CO2: 26 mmol/L (ref 20–32)
Calcium: 9.4 mg/dL (ref 8.6–10.4)
Chloride: 103 mmol/L (ref 98–110)
Creat: 0.98 mg/dL (ref 0.60–1.00)
Globulin: 2.4 g/dL (calc) (ref 1.9–3.7)
Glucose, Bld: 93 mg/dL (ref 65–99)
Potassium: 3.9 mmol/L (ref 3.5–5.3)
Sodium: 141 mmol/L (ref 135–146)
Total Bilirubin: 0.3 mg/dL (ref 0.2–1.2)
Total Protein: 6.5 g/dL (ref 6.1–8.1)

## 2022-02-02 LAB — MAGNESIUM: Magnesium: 1.9 mg/dL (ref 1.5–2.5)

## 2022-02-02 LAB — VITAMIN B12: Vitamin B-12: 333 pg/mL (ref 200–1100)

## 2022-02-02 LAB — TSH: TSH: 2.61 mIU/L (ref 0.40–4.50)

## 2022-02-09 ENCOUNTER — Ambulatory Visit (HOSPITAL_BASED_OUTPATIENT_CLINIC_OR_DEPARTMENT_OTHER)
Admission: RE | Admit: 2022-02-09 | Discharge: 2022-02-09 | Disposition: A | Discharge status: Home / Self Care | Payer: Medicare HMO | Source: Ambulatory Visit | Attending: Family | Admitting: Family

## 2022-02-09 DIAGNOSIS — R296 Repeated falls: Secondary | ICD-10-CM | POA: Diagnosis not present

## 2022-02-09 DIAGNOSIS — R42 Dizziness and giddiness: Secondary | ICD-10-CM | POA: Diagnosis not present

## 2022-02-09 DIAGNOSIS — R29898 Other symptoms and signs involving the musculoskeletal system: Secondary | ICD-10-CM | POA: Diagnosis not present

## 2022-02-09 DIAGNOSIS — R531 Weakness: Secondary | ICD-10-CM | POA: Diagnosis not present

## 2022-02-09 MED ORDER — GADOBUTROL 1 MMOL/ML IV SOLN
8.6000 mL | Freq: Once | INTRAVENOUS | Status: AC | PRN
  Administered 2022-02-09: 8.6 mL via INTRAVENOUS

## 2022-02-12 ENCOUNTER — Encounter: Payer: Self-pay | Admitting: Neurology

## 2022-02-28 DIAGNOSIS — L821 Other seborrheic keratosis: Secondary | ICD-10-CM | POA: Diagnosis not present

## 2022-02-28 DIAGNOSIS — L570 Actinic keratosis: Secondary | ICD-10-CM | POA: Diagnosis not present

## 2022-04-24 ENCOUNTER — Encounter: Payer: Self-pay | Admitting: Neurology

## 2022-04-24 ENCOUNTER — Ambulatory Visit: Payer: Medicare HMO | Admitting: Neurology

## 2022-04-24 VITALS — BP 127/77 | HR 80 | Ht 65.0 in | Wt 186.0 lb

## 2022-04-24 DIAGNOSIS — M48062 Spinal stenosis, lumbar region with neurogenic claudication: Secondary | ICD-10-CM | POA: Diagnosis not present

## 2022-04-24 NOTE — Progress Notes (Signed)
[facility] Clinic Note - Initial Visit   Date: 04/24/2022   [name] MRN: [id number] DOB: May 01, 1946   Dear [name] Mourning, FNP:  Thank you for your kind referral of [name] for consultation of falls. Although her history is well known to you, please allow [place] to reiterate it for the purpose of our medical record. The patient was accompanied to the clinic by self.   [name] is a 76 y.o. right-handed Caucasian female with hyperlipidemia, GERD, history of breast cancer (1996, 2016) s/p chemotherapy and bilateral mastectomy and right elbow melanoma presenting for evaluation of falls and imbalance.   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 Her history and exam is most suggestive of neurogenic claudication and MRI lumbar spine from 04/2021 shows that she has ongoing central canal stenosis at L4-5.  She is followed by Dr [name] orthopeadic spine specialist at [facility].  I recommend starting PT for low back strengthening and if there is no improvement of symptoms, follow-up with Dr. Sherlyn [name].  She has mild peripheral neuorpathy affecting both feet manifesting with distal numbness and ataxia, most likely chemotherapy-induced.  ------------------------------------------------------------- History of present illness: For the past several years, she has been falling frequently because her right leg tends to buckle and goes numb.  She denies similar symptoms in the left leg.  Symptoms are worse with prolonged standing and walking and she often finds herself having to reposition frequently or lean to get relief.  She has history of chronic low back pain and had prior decompression and fusion at L5-S1.  She is followed by Dr. Sherlyn [name] at [facility] in [place].  MRI lumbar spine from December 2022  shows moderate canal stenosis at L4-5.    Out-side paper records, electronic medical record, and images have been reviewed where available and summarized as:  MRI lumbar spine wo contrast 04/30/2021: 1. Continued progression of adjacent segment disease at L4-L5 where there is moderate canal stenosis and mild bilateral foraminal stenosis.  2. Interval cement augmentation of L1 vertebral body compression fracture where there is a chronic superior endplate compression fracture. No significant progression of height loss compared to  prior.  3. Postsurgical changes from posterior and interbody fusion at L5-S1. No residual foraminal or canal stenosis.   MRI cervical spine wwo contrast 02/11/2022: No evidence of acute intracranial abnormality.  Mild chronic small vessel ischemic changes within the cerebral white matter and pons, similar to the prior brain MRI of 10/26/2020.  Lab Results  Component Value Date   HGBA1C 5.3 10/27/2020   Lab Results  Component Value Date   VITAMINB12 333 02/01/2022   Lab Results  Component Value Date   TSH 2.61 02/01/2022   Lab Results  Component Value Date   ESRSEDRATE 14 06/05/2018    Past Medical History:  Diagnosis Date   Allergy    Ankle effusion, left 03/23/2020   Arthritis    "spine" (07/28/2014)   Blood dyscrasia    bruises and bleed easily   Breast cancer, left breast ([place]) 07/28/2014   Breast cancer, right breast ([place]) [place], ABNORMAL 01/04/2008   Qualifier: Diagnosis of  By: Plotnikov MD, Evie Lacks    Chronic back pain    "all over"   Chronic cough 12/07/2007   Could be related to GERD - relapsed    COAGULOPATHY 07/12/2008  Qualifier: Diagnosis of  By: Nils Pyle CMA Deborra Medina), Leisha     Complication of anesthesia    went to sleep easily but hard to wake up up until elbow OR in 2010   DDD (degenerative disc disease), cervical 07/09/2017   DDD (degenerative disc disease), lumbar 07/09/2017   With spinal stenosis.   Dr. Trenton Gammon    Depression    Dizziness 10/27/2020   Dyspnea    Normal Spirometry 03/2008 EF 65% BNP normal 11/2007   Episode of recurrent major depressive disorder ([place]) 12/05/2017   Essential hypertension 02/27/2007   Mild Off meds 2013  BP Readings from Last 3 Encounters:  02/10/12 128/90  08/09/11 134/62  03/06/11 150/86      Family history of breast cancer    Family history of colon cancer    Family history of pancreatic cancer    Fibromyalgia 02/27/2007   Chronic    Gastric polyps    GERD 02/27/2007   S/p Nissen's 2010    GERD (gastroesophageal reflux disease)    History of chicken pox    History of hiatal hernia    Hx of adenomatous polyp of colon 07/03/2015   Kidney stone    right kidney    Lichen sclerosus    Malignant neoplasm of upper-outer quadrant of left breast in female, estrogen receptor positive ([place]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   Medicare annual wellness visit, subsequent 06/30/2016   Melanoma ([place]) 2010   "right elbow; [facility] at [facility]"   Melanoma of upper arm ([place]) 02/10/2012   2010 R  Elbow ([facility])   Mild anxiety    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   Peripheral edema 11/26/2016   Peripheral neuropathy 01/01/2013   Primary osteoarthritis of both hands 07/09/2017   Primary osteoarthritis of both knees 07/09/2017   Chondromalacia patella   Rheumatoid arthritis ([place]) 11/02/2018   S/P laparoscopic fundoplication 96/22/2979   Steatosis of liver 12/07/2009   Qualifier: Diagnosis of  By: Surface RN, Donna     Viral URI with cough 09/11/2014   Vitamin B12 deficiency    VITAMIN B12 DEFICIENCY 02/27/2007   Chronic    Vitamin D deficiency     Past Surgical History:  Procedure Laterality Date   BREAST BIOPSY Left 04/2014   BREAST RECONSTRUCTION WITH PLACEMENT OF TISSUE EXPANDER AND FLEX HD (ACELLULAR HYDRATED DERMIS) Left 07/28/2014   Procedure: LEFT BREAST RECONSTRUCTION PLACEMENT OF LEFT TISSUE EXPANDER ;  Surgeon: Crissie Reese, MD;  Location: [place];  Service: Plastics;  Laterality:  Left;   BREAST RECONSTRUCTION WITH PLACEMENT OF TISSUE EXPANDER AND FLEX HD (ACELLULAR HYDRATED DERMIS) Left 09/08/2014   Procedure: REMOVAL OF TISSUE EXPANDER FROM LEFT BREAST;  Surgeon: Crissie Reese, MD;  Location: [place];  Service: Plastics;  Laterality: Left;   BUNIONECTOMY Bilateral 1970's   COLONOSCOPY     Dr Sharlett Iles   CYSTOSCOPY WITH RETROGRADE PYELOGRAM, URETEROSCOPY AND STENT PLACEMENT Right 06/09/2015   Procedure: CYSTOSCOPY WITH RIGHT RETROGRADE PYELOGRAM, RIGHT URETEROSCOPY AND RIGHT URTERAL STENT PLACEMENT;  Surgeon: Ardis Hughs, MD;  [facility];  Service: Urology;  Laterality: Right;   ESOPHAGOGASTRODUODENOSCOPY     HERNIA REPAIR     HOLMIUM LASER APPLICATION Right 8/92/1194   Procedure: HOLMIUM LASER APPLICATION;  Surgeon: Ardis Hughs, MD;  [facility];  Service: Urology;  Laterality: Right;   LATISSIMUS FLAP TO BREAST Left 09/08/2014   Procedure: LEFT LATISSIMUS FLAP TO BREAST WITH SALINE IMPLANT FOR BREAST RECONSTRUCTION;  Surgeon: Crissie Reese, MD;  Location: [place];  Service: Clinical cytogeneticist;  Laterality: Left;   LUMBAR FUSION  01/2018   L5-S1 transitional segmental laminectomy and Fusion   MASTECTOMY Right 1996    chemotherapy. pt. states 13 lymph nodes were removed   MASTECTOMY COMPLETE / SIMPLE W/ SENTINEL NODE BIOPSY Left 07/28/2014   MASTECTOMY W/ SENTINEL NODE BIOPSY Left 07/28/2014   Procedure: LEFT MASTECTOMY WITH SENTINEL LYMPH NODE MAPPING;  Surgeon: Autumn Messing III, MD;  Location: [place];  Service: General;  Laterality: Left;   MELANOMA EXCISION Right 2010   From elbow-- Done at [facility]  16/1096   RECONSTRUCTION BREAST IMMEDIATE / DELAYED W/ TISSUE EXPANDER Left 07/28/2014   TEMPOROMANDIBULAR JOINT SURGERY Bilateral 1987   TONSILLECTOMY       Medications:  Outpatient Encounter Medications as of 04/24/2022  Medication Sig   Alirocumab (PRALUENT) 75 MG/ML SOAJ Inject 1 mL into the skin every 14 (fourteen) days.   amLODipine  (NORVASC) 5 MG tablet Take 1 tablet (5 mg total) by mouth daily.   citalopram (CELEXA) 20 MG tablet TAKE ONE TABLET BY MOUTH EVERY MORNING   furosemide (LASIX) 40 MG tablet TAKE ONE TABLET BY MOUTH EVERY MORNING   isosorbide mononitrate (IMDUR) 30 MG 24 hr tablet TAKE ONE TABLET BY MOUTH EVERY EVENING   omeprazole (PRILOSEC) 40 MG capsule Take 1 capsule (40 mg total) by mouth daily.   pregabalin (LYRICA) 50 MG capsule Take 50 mg by mouth 2 (two) times daily. (Patient not taking: Reported on 04/24/2022)   No facility-administered encounter medications on file as of 04/24/2022.    Allergies:  Allergies  Allergen Reactions   Other Anaphylaxis and Swelling    Glue (eye lash glue, gorilla glue)   Silodosin Rash    Facial rash   Ace Inhibitors Other (See Comments)    REACTION: angioedema   Buprenorphine Hcl Other (See Comments)    "crazy"   Morphine And Related Other (See Comments)    "crazy" "crazy"   Codeine Rash   Sulfonamide Derivatives Rash   Telmisartan Rash    Family History: Family History  Problem Relation Age of Onset   Colon cancer Cousin 77       double first cousin   Colon cancer Cousin 71       double first cousin   Colon polyps Father        between 58-20   Dementia Father    CAD Father        CABG at age 55   Stroke Mother    Pancreatic cancer Brother 51   Colon polyps Brother        between 35-20   Cancer Paternal Uncle        NOS   Anemia Paternal Grandfather        pernicious anemia   Breast cancer Cousin 48       double first cousin   Breast cancer Cousin 73       paternal cousin   Stroke Other        F 1st degree relative 57, M 1st degree relative   Esophageal cancer Neg Hx    Stomach cancer Neg Hx     Social History: Social History   Tobacco Use   Smoking status: Never   Smokeless tobacco: Never   Tobacco comments:    Regular Exercise - Yes  Vaping Use   Vaping Use: Never used  Substance Use Topics   Alcohol use: No   Drug use: No  Social History   Social History Narrative   She lives with husband.     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  Speech is not dysarthric.  CRANIAL NERVES: II:  No visual field defects.     III-IV-VI: Pupils equal round and reactive to light.  Normal conjugate, extra-ocular eye movements in all directions of gaze.  No nystagmus.  No ptosis.   V:  Normal facial sensation.    VII:  Normal facial symmetry and movements.   VIII:  Normal hearing and vestibular function.   IX-X:  Normal palatal movement.   XI:  Normal shoulder shrug and head rotation.   XII:  Normal tongue strength and range of motion, no deviation or fasciculation.  MOTOR:  No atrophy, fasciculations or abnormal movements.  No pronator drift.   Upper Extremity:  Right  Left  Deltoid  5/5   5/5   Biceps  5/5   5/5   Triceps  5/5   5/5   Wrist extensors  5/5   5/5   Wrist flexors  5/5   5/5   Finger extensors  5/5   5/5   Finger flexors  5/5   5/5   Dorsal interossei  5/5   5/5   Abductor pollicis  5/5   5/5   Tone (Ashworth scale)  0  0   Lower Extremity:  Right  Left  Hip flexors  5/5   5/5   Hip extensors  5/5   5/5   Knee flexors  5/5   5/5   Knee extensors  5/5   5/5   Dorsiflexors  5/5   5/5   Plantarflexors  5/5   5/5   Toe extensors  5/5   5/5   Toe flexors  5/5   5/5   Tone (Ashworth scale)  0  0   MSRs:                                           Right        Left brachioradialis 2+  2+  biceps 2+  2+  triceps 2+  2+  patellar 3+  3+  ankle jerk 2+  2+  Hoffman no  no   plantar response down  down  Crossed adductors bilaterally  SENSORY:  Pin prick, temperature, and vibration absent below the ankles bilaterally.  Sensation intact in the hands.  Romberg's sign present.   COORDINATION/GAIT: Normal finger-to- nose-finger.  Intact rapid alternating movements bilaterally.  Gait narrow based and stable, unassisted. Stressed gait intact. Unable to perform tandem gait.      Thank you for allowing me to participate in patient's care.  If I can answer any additional questions, I would be pleased to do so.    Sincerely,    Janaisha Tolsma K. Posey Pronto, DO

## 2022-04-24 NOTE — Patient Instructions (Signed)
Start physical therapy for low back strengthening and balance training  Follow-up with your spine specialist, if there has been no improvement after completing physical therapy

## 2022-04-27 ENCOUNTER — Other Ambulatory Visit: Payer: Self-pay | Admitting: Cardiology

## 2022-04-27 DIAGNOSIS — E7800 Pure hypercholesterolemia, unspecified: Secondary | ICD-10-CM

## 2022-04-27 DIAGNOSIS — I25118 Atherosclerotic heart disease of native coronary artery with other forms of angina pectoris: Secondary | ICD-10-CM

## 2022-05-07 NOTE — Therapy (Signed)
OUTPATIENT PHYSICAL THERAPY THORACOLUMBAR EVALUATION   Patient Name: [name] MRN: [id number] DOB:December 30, 1945, 76 y.o., female Today's Date: 05/08/2022  END OF SESSION:  PT End of Session - 05/08/22 0800     Visit Number 1    Date for PT Re-Evaluation 06/19/22    Authorization Type [employer]    PT Start Time 0805    PT Stop Time 0853    PT Time Calculation (min) 48 min    Activity Tolerance Patient tolerated treatment well    Behavior During Therapy [facility] for tasks assessed/performed             Past Medical History:  Diagnosis Date   Allergy    Ankle effusion, left 03/23/2020   Arthritis    "spine" (07/28/2014)   Blood dyscrasia    bruises and bleed easily   Breast cancer, left breast ([place]) 07/28/2014   Breast cancer, right breast ([place]) [place], ABNORMAL 01/04/2008   Qualifier: Diagnosis of  By: Plotnikov MD, Evie Lacks    Chronic back pain    "all over"   Chronic cough 12/07/2007   Could be related to GERD - relapsed    COAGULOPATHY 07/12/2008   Qualifier: Diagnosis of  By: Nils Pyle CMA (AAMA), Leisha     Complication of anesthesia    went to sleep easily but hard to wake up up until elbow OR in 2010   DDD (degenerative disc disease), cervical 07/09/2017   DDD (degenerative disc disease), lumbar 07/09/2017   With spinal stenosis.   Dr. Trenton Gammon   Depression    Dizziness 10/27/2020   Dyspnea    Normal Spirometry 03/2008 EF 65% BNP normal 11/2007   Episode of recurrent major depressive disorder ([place]) 12/05/2017   Essential hypertension 02/27/2007   Mild Off meds 2013  BP Readings from Last 3 Encounters:  02/10/12 128/90  08/09/11 134/62  03/06/11 150/86      Family history of breast cancer    Family history of colon cancer    Family history of pancreatic cancer    Fibromyalgia 02/27/2007   Chronic    Gastric polyps    GERD 02/27/2007   S/p Nissen's 2010    GERD (gastroesophageal reflux disease)    History of chicken pox    History of hiatal  hernia    Hx of adenomatous polyp of colon 07/03/2015   Kidney stone    right kidney    Lichen sclerosus    Malignant neoplasm of upper-outer quadrant of left breast in female, estrogen receptor positive ([place]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   Medicare annual wellness visit, subsequent 06/30/2016   Melanoma ([place]) 2010   "right elbow; [facility] at [facility]"   Melanoma of upper arm ([place]) 02/10/2012   2010 R  Elbow ([facility])   Mild anxiety    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   Peripheral edema 11/26/2016   Peripheral neuropathy 01/01/2013   Primary osteoarthritis of both hands 07/09/2017   Primary osteoarthritis of both knees 07/09/2017   Chondromalacia patella   Rheumatoid arthritis ([place]) 11/02/2018   S/P laparoscopic fundoplication 03/54/6568   Steatosis of liver 12/07/2009   Qualifier: Diagnosis of  By: Surface RN, Donna     Viral URI with cough 09/11/2014   Vitamin B12 deficiency    VITAMIN B12 DEFICIENCY 02/27/2007   Chronic    Vitamin D deficiency    Past Surgical History:  Procedure Laterality Date   BREAST BIOPSY Left 04/2014   BREAST RECONSTRUCTION WITH PLACEMENT OF TISSUE  EXPANDER AND FLEX HD (ACELLULAR HYDRATED DERMIS) Left 07/28/2014   Procedure: LEFT BREAST RECONSTRUCTION PLACEMENT OF LEFT TISSUE EXPANDER ;  Surgeon: Crissie Reese, MD;  Location: [place];  Service: Plastics;  Laterality: Left;   BREAST RECONSTRUCTION WITH PLACEMENT OF TISSUE EXPANDER AND FLEX HD (ACELLULAR HYDRATED DERMIS) Left 09/08/2014   Procedure: REMOVAL OF TISSUE EXPANDER FROM LEFT BREAST;  Surgeon: Crissie Reese, MD;  Location: [place];  Service: Plastics;  Laterality: Left;   BUNIONECTOMY Bilateral 1970's   COLONOSCOPY     Dr Sharlett Iles   CYSTOSCOPY WITH RETROGRADE PYELOGRAM, URETEROSCOPY AND STENT PLACEMENT Right 06/09/2015   Procedure: CYSTOSCOPY WITH RIGHT RETROGRADE PYELOGRAM, RIGHT URETEROSCOPY AND RIGHT URTERAL STENT PLACEMENT;  Surgeon: Ardis Hughs, MD;  Location: [facility];  Service: Urology;  Laterality:  Right;   ESOPHAGOGASTRODUODENOSCOPY     HERNIA REPAIR     HOLMIUM LASER APPLICATION Right 1/61/0960   Procedure: HOLMIUM LASER APPLICATION;  Surgeon: Ardis Hughs, MD;  Location: [facility];  Service: Urology;  Laterality: Right;   LATISSIMUS FLAP TO BREAST Left 09/08/2014   Procedure: LEFT LATISSIMUS FLAP TO BREAST WITH SALINE IMPLANT FOR BREAST RECONSTRUCTION;  Surgeon: Crissie Reese, MD;  Location: [place];  Service: Plastics;  Laterality: Left;   LUMBAR FUSION  01/2018   L5-S1 transitional segmental laminectomy and Fusion   MASTECTOMY Right 1996    chemotherapy. pt. states 13 lymph nodes were removed   MASTECTOMY COMPLETE / SIMPLE W/ SENTINEL NODE BIOPSY Left 07/28/2014   MASTECTOMY W/ SENTINEL NODE BIOPSY Left 07/28/2014   Procedure: LEFT MASTECTOMY WITH SENTINEL LYMPH NODE MAPPING;  Surgeon: Autumn Messing III, MD;  Location: [place] [facility];  Service: General;  Laterality: Left;   MELANOMA EXCISION Right 2010   From elbow-- Done at [place]  45/4098   RECONSTRUCTION BREAST IMMEDIATE / DELAYED W/ TISSUE EXPANDER Left 07/28/2014   TEMPOROMANDIBULAR JOINT SURGERY Bilateral 1987   TONSILLECTOMY     Patient Active Problem List   Diagnosis Date Noted   Arthritis of left knee 12/26/2021   Allergy 11/91/4782   Lichen sclerosus 95/62/1308   Dizziness 10/27/2020   Vitamin B12 deficiency    Mild anxiety    Kidney stone    History of hiatal hernia    History of chicken pox    GERD (gastroesophageal reflux disease)    Gastric polyps    Dyspnea    Depression    Complication of anesthesia    Chronic back pain    Blood dyscrasia    Arthritis    Ankle effusion, left 03/23/2020   Rheumatoid arthritis ([place]) 11/02/2018   Episode of recurrent major depressive disorder ([place]) 12/05/2017   Primary osteoarthritis of both hands 07/09/2017   Primary osteoarthritis of both knees 07/09/2017   DDD (degenerative disc disease), cervical 07/09/2017   DDD (degenerative disc disease), lumbar  07/09/2017   Peripheral edema 11/26/2016   Medicare annual wellness visit, subsequent 06/30/2016   Hx of adenomatous polyp of colon 07/03/2015   Viral URI with cough 65/78/4696   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   Breast cancer, left breast ([place]) 07/28/2014   Family history of breast cancer    Family history of colon cancer    Family history of pancreatic cancer    Malignant neoplasm of upper-outer quadrant of left breast in female, estrogen receptor positive ([place]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   Peripheral neuropathy 01/01/2013   Melanoma of upper arm ([place]) 02/10/2012   S/P laparoscopic fundoplication 29/52/8413   Steatosis of  liver 12/07/2009   COAGULOPATHY 07/12/2008   Melanoma ([place]) 2010   CHEST XRAY, ABNORMAL 01/04/2008   Chronic cough 12/07/2007   Vitamin D deficiency 04/14/2007   VITAMIN B12 DEFICIENCY 02/27/2007   Essential hypertension 02/27/2007   GERD 02/27/2007   Fibromyalgia 02/27/2007   Breast cancer, right breast ([place]) 1995    PCP: Marrian Salvage, [place]  REFERRING PROVIDER: Alda Berthold, DO  REFERRING DIAG: [phone] (ICD-10-CM) - Spinal stenosis of lumbar region with neurogenic claudication   THERAPY DIAG:  Other low back pain - Plan: PT plan of care cert/re-cert  Radiculopathy, lumbar region - Plan: PT plan of care cert/re-cert  Unsteadiness on feet - Plan: PT plan of care cert/re-cert  Other abnormalities of gait and mobility - Plan: PT plan of care cert/re-cert  Muscle weakness (generalized) - Plan: PT plan of care cert/re-cert  RATIONALE FOR EVALUATION AND TREATMENT: Rehabilitation  ONSET DATE: 1-2 years  NEXT MD VISIT: "only visit with referring doctor is if therapy doesn't work"   SUBJECTIVE:                                                                                                                                                                                           SUBJECTIVE STATEMENT: Pt reports that she fell at church and has  previously has had multiple falls. She feels like she is unable to step forward and feels like her leg goes numb. Pt states that she has LBP R>L but denies sharp shooting pain down LE; N/T is present. She states the pain is achy and the R leg feels as if it isn't there. She previously had a stroke and states that her vision was affected.  PERTINENT HISTORY:  Breast cancer with reconstruction surgery, GERD, DDD (cervical/lumbar), depression, L5-S1 Lumbar fusion/laminectomy, Dyspnea, Fibromyalgia  PAIN:  Are you having pain? Yes: NPRS scale: 8/10 Pain location: low back R>L Pain description: achy, numbness/tingling Aggravating factors: standing, walking, sitting Relieving factors: meds  PRECAUTIONS: Fall   WEIGHT BEARING RESTRICTIONS: No  FALLS:  Has patient fallen in last 6 months? Yes. Number of falls 5 walking or going down stairs  LIVING ENVIRONMENT: Lives with: lives with their family Lives in: House/apartment Stairs: Yes: External: 3 steps; none Has following equipment at home: Single point cane  OCCUPATION: part time work  PLOF: Independent and Leisure: dancing  PATIENT GOALS: "relieve pain and feel more secure walking"   OBJECTIVE:   DIAGNOSTIC FINDINGS:  12/26/21 XR L Knee Findings:  Films of the left knee were obtained in 3 projections standing. There are mild tricompartmental degenerative changes with some mild subchondral sclerosis more medial  than lateral with some very small peripheral osteophytes. Joint spaces are relatively well-maintained and alignment appears to be neutral. Some patellofemoral arthritis with osteophytes but the patella tracks in the midline. 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 Degenerative changes are noted with bony spurs, more so in the lower thoracic spine. There is decrease in height of body of L1 vertebra. There is evidence of  previous vertebroplasty in the body of L1 vertebra. There is levoscoliosis in the lower thoracic spine and dextroscoliosis in the lumbar spine. Paraspinal soft tissues are unremarkable. Surgical clips are seen in the chest and upper abdomen. Visualized lung fields are unremarkable.   IMPRESSION: No recent fracture is seen.  Degenerative changes are noted.   There is previous vertebroplasty in the body of L1 vertebra.  PATIENT SURVEYS:  ABC scale 1180 / 1600 = 73.8 % Modified Oswestry 20 / 50 = 40.0 %   SCREENING FOR RED FLAGS: Bowel or bladder incontinence: No Spinal tumors: No Cauda equina syndrome: No Compression fracture: No Abdominal aneurysm: No  COGNITION: Overall cognitive status: Within functional limits for tasks assessed     SENSATION: Light touch: WFL Proprioception: WFL  MUSCLE LENGTH: NT  POSTURE:  slight forward flexion  PALPATION: Marked tenderness along glute med/min, and hypomobility of Lumbar spine from L1-L5  LUMBAR ROM:   AROM eval  Flexion 50% limited*  Extension 25% limited*  Right lateral flexion 25% limited*  Left lateral flexion 25% limited  Right rotation 25% limited*  Left rotation 25   (Blank rows = not tested, *=pain)  LOWER EXTREMITY ROM:    Grossly WFL  LOWER EXTREMITY MMT:    MMT Right eval Left eval  Hip flexion 3+ 3+  Hip extension 4* 4*  Hip abduction 4-* 4-*  Hip adduction 4* 4*  Hip internal rotation 4 4  Hip external rotation 4 4  Knee flexion 5 5  Knee extension 5 5  Ankle dorsiflexion 4+ 4+  Ankle plantarflexion    Ankle inversion    Ankle eversion     (Blank rows = not tested, *=tested in seated position)  LUMBAR SPECIAL TESTS:  Slump test: Positive  FUNCTIONAL TESTS:  10 meter walk test: 13.21 sec; Gait speed = 2.48 ft/sec Functional gait assessment: 16/30  GAIT: Distance walked: 120 ft Assistive device utilized: None Level of assistance: Complete Independence Comments: decreased gait speed,  decreased BLE foot clearance and step length   TODAY'S TREATMENT:                                                                                                                              DATE:   05/08/22  Initial eval FGA: 16/30 Supine double knee to chest stretch 1x30 sec Seated QL Stretch R/L 1x30 sec Seated Lumbar Forward Flexion Stretch 1x30 sec   PATIENT EDUCATION:  Education details: PT  eval findings, anticipated POC, ongoing PT POC, and initial HEP Person educated: Patient Education method: Explanation, Demonstration, Tactile cues, Verbal cues, and Handouts Education comprehension: verbalized understanding and returned demonstration  HOME EXERCISE PROGRAM: Access Code: [email or web address] URL: [email or web address] Date: 05/08/2022 Prepared by: Zeb Comfort  Exercises - Seated Flexion Stretch  - 1 x daily - 7 x weekly - 3 reps - 30 hold - Seated Quadratus Lumborum Stretch in Chair  - 1 x daily - 7 x weekly - 2 reps - 30 hold - Supine Double Knee to Chest  - 1 x daily - 7 x weekly - 3 reps - 30 hold  ASSESSMENT:  CLINICAL IMPRESSION: [name] is a 76 y.o. female who was seen today for physical therapy evaluation and treatment for low pain back with N/T and balance deficits. She presents with complaints of multiple falls due to balance deficits with low back pain and N/T in the R LE. She states that she occasionally can not feel her R LE progressing when walking and descending stairs. Deficits include decreased lumbar mobility and proximal hip muscle strength, radicular symptoms of R LE, decreased dynamic balance, and difficulty descending stairs. She was able to tolerate the session well with a positive response to forward flexion exercises of lumbar spine. She scored 16/30 on the FGA, indicating increased fall risk. She will benefit from skilled therapy of TE to address proximal hip muscles deficits and flexion preference exercises, NMR to address dynamic balance  deficits, and TA to address functional activities such as stairs.   OBJECTIVE IMPAIRMENTS: Abnormal gait, decreased balance, decreased coordination, decreased endurance, decreased knowledge of condition, decreased knowledge of use of DME, decreased mobility, difficulty walking, decreased ROM, decreased strength, decreased safety awareness, hypomobility, increased fascial restrictions, impaired perceived functional ability, impaired flexibility, impaired sensation, impaired vision/preception, postural dysfunction, and pain.   ACTIVITY LIMITATIONS: carrying, lifting, bending, sitting, standing, squatting, stairs, and locomotion level  PARTICIPATION LIMITATIONS: meal prep, cleaning, laundry, driving, shopping, community activity, occupation, and dancing  PERSONAL FACTORS: Age, Past/current experiences, Time since onset of injury/illness/exacerbation, and 3+ comorbidities: Breast cancer with reconstruction surgery, GERD, DDD (cervical/lumbar), depression, L5-S1 Lumbar fusion/laminectomy, Dyspnea, Fibromyalgia  are also affecting patient's functional outcome.   REHAB POTENTIAL: Good  CLINICAL DECISION MAKING: Evolving/moderate complexity  EVALUATION COMPLEXITY: Moderate   GOALS: Goals reviewed with patient? No  SHORT TERM GOALS: Target date: 05/29/22  Pt will be independent with initial HEP.  Baseline: Goal status: INITIAL  LONG TERM GOALS: Target date: 06/19/22  Pt will be independent with advanced/ongoing HEP to improve outcomes/carryover.  Baseline:  Goal status: INITIAL  2.  Pt will report at least 50-75% improvement in low back and R radiating LE pain to improve QOL.  Baseline:  Goal status: INITIAL  3.  Pt will score </= 28% on Modified Oswestry to demonstrate improved functional ability. Baseline: 20 / 50 = 40.0 % Goal status: INITIAL  4.  Pt will score at least >/= 22/30 on FGA to decrease risk of falls.  Baseline: 16/30 Goal status: INITIAL  5.  Pt will score </= 54%   on ABC scale to demonstrate improved functional ability. Baseline: 1180 / 1600 = 73.8 % Goal status: INITIAL  6.  Pt will be able to ascend/descend stairs with 1 HR and reciprocal step pattern safely to access home and community.    Baseline:  Goal status: INITIAL  7.  Pt will amb >15 minutes with normal gait pattern without increased pain to access community.   Baseline:  Goal status: INITIAL  8.  Pt will perform 10 MWT in <12.5 sec to safely walk in the community to improve QOL. Baseline: 13.21 sec Goal status: INITIAL   PLAN:  PT FREQUENCY: 2x/week  PT DURATION: 6 weeks  PLANNED INTERVENTIONS: Therapeutic exercises, Therapeutic activity, Neuromuscular re-education, Balance training, Gait training, Patient/Family education, Self Care, Joint mobilization, Stair training, DME instructions, Aquatic Therapy, Dry Needling, Electrical stimulation, Spinal manipulation, Spinal mobilization, Cryotherapy, Moist heat, Vasopneumatic device, Traction, Ultrasound, Ionotophoresis '4mg'$ /ml Dexamethasone, Manual therapy, and Re-evaluation.  PLAN FOR NEXT SESSION: review initial HEP, flexion preference exercises, lumbar mobility, strengthening of proximal hip muscles, dynamic balance, stairs, gait   Zeb Comfort, Student-PT 05/08/2022, 1:10 PM

## 2022-05-08 ENCOUNTER — Ambulatory Visit: Payer: Medicare HMO | Attending: Neurology | Admitting: Physical Therapy

## 2022-05-08 ENCOUNTER — Other Ambulatory Visit: Payer: Self-pay

## 2022-05-08 ENCOUNTER — Encounter: Payer: Self-pay | Admitting: Physical Therapy

## 2022-05-08 DIAGNOSIS — M48062 Spinal stenosis, lumbar region with neurogenic claudication: Secondary | ICD-10-CM | POA: Insufficient documentation

## 2022-05-08 DIAGNOSIS — R2681 Unsteadiness on feet: Secondary | ICD-10-CM | POA: Insufficient documentation

## 2022-05-08 DIAGNOSIS — M5459 Other low back pain: Secondary | ICD-10-CM | POA: Insufficient documentation

## 2022-05-08 DIAGNOSIS — M6281 Muscle weakness (generalized): Secondary | ICD-10-CM | POA: Diagnosis not present

## 2022-05-08 DIAGNOSIS — R2689 Other abnormalities of gait and mobility: Secondary | ICD-10-CM | POA: Diagnosis not present

## 2022-05-08 DIAGNOSIS — M5416 Radiculopathy, lumbar region: Secondary | ICD-10-CM | POA: Diagnosis not present

## 2022-05-13 ENCOUNTER — Ambulatory Visit: Payer: Medicare HMO

## 2022-05-15 ENCOUNTER — Ambulatory Visit: Payer: Medicare HMO | Admitting: Physical Therapy

## 2022-05-15 ENCOUNTER — Encounter: Payer: Self-pay | Admitting: Physical Therapy

## 2022-05-15 DIAGNOSIS — M5416 Radiculopathy, lumbar region: Secondary | ICD-10-CM

## 2022-05-15 DIAGNOSIS — M6281 Muscle weakness (generalized): Secondary | ICD-10-CM | POA: Diagnosis not present

## 2022-05-15 DIAGNOSIS — R2681 Unsteadiness on feet: Secondary | ICD-10-CM | POA: Diagnosis not present

## 2022-05-15 DIAGNOSIS — M48062 Spinal stenosis, lumbar region with neurogenic claudication: Secondary | ICD-10-CM | POA: Diagnosis not present

## 2022-05-15 DIAGNOSIS — M5459 Other low back pain: Secondary | ICD-10-CM | POA: Diagnosis not present

## 2022-05-15 DIAGNOSIS — R2689 Other abnormalities of gait and mobility: Secondary | ICD-10-CM | POA: Diagnosis not present

## 2022-05-15 NOTE — Therapy (Addendum)
OUTPATIENT PHYSICAL THERAPY TREATMENT / DISCHARGE SUMMARY   Patient Name: [name] MRN: [id number] DOB:[birth year], 76 y.o., female Today's Date: 05/15/2022  END OF SESSION:  PT End of Session - 05/15/22 0805     Visit Number 2    Date for PT Re-Evaluation 06/19/22    Authorization Type [employer]    PT Start Time 0805    PT Stop Time 0847    PT Time Calculation (min) 42 min    Activity Tolerance Patient tolerated treatment well    Behavior During Therapy [facility] for tasks assessed/performed             Past Medical History:  Diagnosis Date   Allergy    Ankle effusion, left 03/23/2020   Arthritis    "spine" (07/28/2014)   Blood dyscrasia    bruises and bleed easily   Breast cancer, left breast (HCC) 07/28/2014   Breast cancer, right breast (HCC) 1995   CHEST XRAY, ABNORMAL 01/04/2008   Qualifier: Diagnosis of  By: Plotnikov MD, Georgina Quint    Chronic back pain    "all over"   Chronic cough 12/07/2007   Could be related to GERD - relapsed    COAGULOPATHY 07/12/2008   Qualifier: Diagnosis of  By: Koleen Distance CMA (AAMA), Leisha     Complication of anesthesia    went to sleep easily but hard to wake up up until elbow OR in 2010   DDD (degenerative disc disease), cervical 07/09/2017   DDD (degenerative disc disease), lumbar 07/09/2017   With spinal stenosis.   Dr. Dutch Quint   Depression    Dizziness 10/27/2020   Dyspnea    Normal Spirometry 03/2008 EF 65% BNP normal 11/2007   Episode of recurrent major depressive disorder (HCC) 12/05/2017   Essential hypertension 02/27/2007   Mild Off meds 2013  BP Readings from Last 3 Encounters:  02/10/12 128/90  08/09/11 134/62  03/06/11 150/86      Family history of breast cancer    Family history of colon cancer    Family history of pancreatic cancer    Fibromyalgia 02/27/2007   Chronic    Gastric polyps    GERD 02/27/2007   S/p Nissen's 2010    GERD (gastroesophageal reflux disease)    History of chicken pox    History of  hiatal hernia    Hx of adenomatous polyp of colon 07/03/2015   Kidney stone    right kidney    Lichen sclerosus    Malignant neoplasm of upper-outer quadrant of left breast in female, 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   Medicare annual wellness visit, subsequent 06/30/2016   Melanoma (HCC) 2010   "right elbow; treated at [facility]"   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 ([facility])   Mild anxiety    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   Peripheral edema 11/26/2016   Peripheral neuropathy 01/01/2013   Primary osteoarthritis of both hands 07/09/2017   Primary osteoarthritis of both knees 07/09/2017   Chondromalacia patella   Rheumatoid arthritis (HCC) 11/02/2018   S/P laparoscopic fundoplication 08/09/2011   Steatosis of liver 12/07/2009   Qualifier: Diagnosis of  By: Surface RN, Donna     Viral URI with cough 09/11/2014   Vitamin B12 deficiency    VITAMIN B12 DEFICIENCY 02/27/2007   Chronic    Vitamin D deficiency    Past Surgical History:  Procedure Laterality Date   BREAST BIOPSY Left 04/2014   BREAST RECONSTRUCTION WITH PLACEMENT  OF TISSUE EXPANDER AND FLEX HD (ACELLULAR HYDRATED DERMIS) Left 07/28/2014   Procedure: LEFT BREAST RECONSTRUCTION PLACEMENT OF LEFT TISSUE EXPANDER ;  Surgeon: Etter Sjogren, MD;  Location: [facility] OR;  Service: Plastics;  Laterality: Left;   BREAST RECONSTRUCTION WITH PLACEMENT OF TISSUE EXPANDER AND FLEX HD (ACELLULAR HYDRATED DERMIS) Left 09/08/2014   Procedure: REMOVAL OF TISSUE EXPANDER FROM LEFT BREAST;  Surgeon: Etter Sjogren, MD;  Location: [facility] OR;  Service: Plastics;  Laterality: Left;   BUNIONECTOMY Bilateral 1970's   COLONOSCOPY     Dr Jarold Motto   CYSTOSCOPY WITH RETROGRADE PYELOGRAM, URETEROSCOPY AND STENT PLACEMENT Right 06/09/2015   Procedure: CYSTOSCOPY WITH RIGHT RETROGRADE PYELOGRAM, RIGHT URETEROSCOPY AND RIGHT URTERAL STENT PLACEMENT;  Surgeon: Crist Fat, MD;  Location: [facility] ORS;  Service: Urology;   Laterality: Right;   ESOPHAGOGASTRODUODENOSCOPY     HERNIA REPAIR     HOLMIUM LASER APPLICATION Right 06/09/2015   Procedure: HOLMIUM LASER APPLICATION;  Surgeon: Crist Fat, MD;  Location: [facility] ORS;  Service: Urology;  Laterality: Right;   LATISSIMUS FLAP TO BREAST Left 09/08/2014   Procedure: LEFT LATISSIMUS FLAP TO BREAST WITH SALINE IMPLANT FOR BREAST RECONSTRUCTION;  Surgeon: Etter Sjogren, MD;  Location: [facility] OR;  Service: Plastics;  Laterality: Left;   LUMBAR FUSION  01/2018   L5-S1 transitional segmental laminectomy and Fusion   MASTECTOMY Right 1996    chemotherapy. pt. states 13 lymph nodes were removed   MASTECTOMY COMPLETE / SIMPLE W/ SENTINEL NODE BIOPSY Left 07/28/2014   MASTECTOMY W/ SENTINEL NODE BIOPSY Left 07/28/2014   Procedure: LEFT MASTECTOMY WITH SENTINEL LYMPH NODE MAPPING;  Surgeon: Chevis Pretty III, MD;  Location: [facility] OR;  Service: General;  Laterality: Left;   MELANOMA EXCISION Right 2010   From elbow-- [facility]    NISSEN FUNDOPLICATION  09/2008   RECONSTRUCTION BREAST IMMEDIATE / DELAYED W/ TISSUE EXPANDER Left 07/28/2014   TEMPOROMANDIBULAR JOINT SURGERY Bilateral 1987   TONSILLECTOMY     Patient Active Problem List   Diagnosis Date Noted   Arthritis of left knee 12/26/2021   Allergy 10/17/2021   Lichen sclerosus 12/06/2020   Dizziness 10/27/2020   Vitamin B12 deficiency    Mild anxiety    Kidney stone    History of hiatal hernia    History of chicken pox    GERD (gastroesophageal reflux disease)    Gastric polyps    Dyspnea    Depression    Complication of anesthesia    Chronic back pain    Blood dyscrasia    Arthritis    Ankle effusion, left 03/23/2020   Rheumatoid arthritis (HCC) 11/02/2018   Episode of recurrent major depressive disorder (HCC) 12/05/2017   Primary osteoarthritis of both hands 07/09/2017   Primary osteoarthritis of both knees 07/09/2017   DDD (degenerative disc disease), cervical 07/09/2017   DDD (degenerative disc disease),  lumbar 07/09/2017   Peripheral edema 11/26/2016   Medicare annual wellness visit, subsequent 06/30/2016   Hx of adenomatous polyp of colon 07/03/2015   Viral URI with cough 09/11/2014   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   Breast cancer, left breast (HCC) 07/28/2014   Family history of breast cancer    Family history of colon cancer    Family history of pancreatic cancer    Malignant neoplasm of upper-outer quadrant of left breast in female, 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   Peripheral neuropathy 01/01/2013   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   S/P laparoscopic fundoplication 08/09/2011  Steatosis of liver 12/07/2009   COAGULOPATHY 07/12/2008   Melanoma (HCC) 2010   CHEST XRAY, ABNORMAL 01/04/2008   Chronic cough 12/07/2007   Vitamin D deficiency 04/14/2007   VITAMIN B12 DEFICIENCY 02/27/2007   Essential hypertension 02/27/2007   GERD 02/27/2007   Fibromyalgia 02/27/2007   Breast cancer, right breast (HCC) 1995    PCP: Olive Bass, FNP  REFERRING PROVIDER: Glendale Chard, DO  REFERRING DIAG: [phone] (ICD-10-CM) - Spinal stenosis of lumbar region with neurogenic claudication   THERAPY DIAG:  Other low back pain  Radiculopathy, lumbar region  Unsteadiness on feet  Other abnormalities of gait and mobility  Muscle weakness (generalized)  RATIONALE FOR EVALUATION AND TREATMENT: Rehabilitation  ONSET DATE: 1-2 years  NEXT MD VISIT: "only visit with referring doctor is if therapy doesn't work"   SUBJECTIVE:                                                                                                                                                                                           SUBJECTIVE STATEMENT: Pt reports she has been doing the HEP everyday and feels like the exercises have gotten better each day.  PAIN:  Are you having pain? No  PERTINENT HISTORY:  Breast cancer with reconstruction surgery, GERD, DDD  (cervical/lumbar), depression, L5-S1 Lumbar fusion/laminectomy, Dyspnea, Fibromyalgia  PRECAUTIONS: Fall   WEIGHT BEARING RESTRICTIONS: No  FALLS:  Has patient fallen in last 6 months? Yes. Number of falls 5 walking or going down stairs  LIVING ENVIRONMENT: Lives with: lives with their family Lives in: House/apartment Stairs: Yes: External: 3 steps; none Has following equipment at home: Single point cane  OCCUPATION: part time work  PLOF: Independent and Leisure: dancing  PATIENT GOALS: "relieve pain and feel more secure walking"   OBJECTIVE:   DIAGNOSTIC FINDINGS:  12/26/21 XR L Knee Findings:  Films of the left knee were obtained in 3 projections standing. There are mild tricompartmental degenerative changes with some mild subchondral sclerosis more medial than lateral with some very small peripheral osteophytes. Joint spaces are relatively well-maintained and alignment appears to be neutral. Some patellofemoral arthritis with osteophytes but the patella tracks in the midline. 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 Degenerative changes are noted with bony spurs, more so in the lower thoracic spine. There is decrease in height of body of L1 vertebra. There is evidence of previous vertebroplasty in the body of L1 vertebra. There is levoscoliosis in the lower thoracic spine and dextroscoliosis in the lumbar spine. Paraspinal soft tissues  are unremarkable. Surgical clips are seen in the chest and upper abdomen. Visualized lung fields are unremarkable.   IMPRESSION: No recent fracture is seen.  Degenerative changes are noted.   There is previous vertebroplasty in the body of L1 vertebra.  PATIENT SURVEYS:  ABC scale 1180 / 1600 = 73.8 % Modified Oswestry 20 / 50 = 40.0 %   SCREENING FOR RED FLAGS: Bowel or bladder incontinence: No Spinal tumors: No Cauda equina  syndrome: No Compression fracture: No Abdominal aneurysm: No  COGNITION: Overall cognitive status: Within functional limits for tasks assessed     SENSATION: Light touch: WFL Proprioception: WFL  MUSCLE LENGTH: NT  POSTURE:  slight forward flexion  PALPATION: Marked tenderness along glute med/min, and hypomobility of Lumbar spine from L1-L5  LUMBAR ROM:   AROM eval  Flexion 50% limited*  Extension 25% limited*  Right lateral flexion 25% limited*  Left lateral flexion 25% limited  Right rotation 25% limited*  Left rotation 25   (Blank rows = not tested, *=pain)  LOWER EXTREMITY ROM:    Grossly WFL  LOWER EXTREMITY MMT:    MMT Right eval Left eval  Hip flexion 3+ 3+  Hip extension 4* 4*  Hip abduction 4-* 4-*  Hip adduction 4* 4*  Hip internal rotation 4 4  Hip external rotation 4 4  Knee flexion 5 5  Knee extension 5 5  Ankle dorsiflexion 4+ 4+  Ankle plantarflexion    Ankle inversion    Ankle eversion     (Blank rows = not tested, *=tested in seated position)  LUMBAR SPECIAL TESTS:  Slump test: Positive  FUNCTIONAL TESTS:  10 meter walk test: 13.21 sec; Gait speed = 2.48 ft/sec Functional gait assessment: 16/30  GAIT: Distance walked: 120 ft Assistive device utilized: None Level of assistance: Complete Independence Comments: decreased gait speed, decreased BLE foot clearance and step length   TODAY'S TREATMENT:                                                                                                                              DATE:   05/15/22 THERAPEUTIC EXERCISE: to improve flexibility, strength and mobility.  Verbal and tactile cues throughout for technique. NuStep - L4 x 6 min  Seated lumbar forward flexion stretch x 30" Seated R/L QL stretch x 30" each Seated 3-way lumbar flexion stretch with UE support on SPC x 30" each Supine DKTC stretch 2 x 30" - 2nd rep with gentle overpressure on back of thighs Hooklying TrA isometric 10 x  5" Hooklying PPT 10 x 5" Hooklying PPT + hip ADD isometric ball squeeze 10 x 5" Hooklying TrA/PPT + RTB hip flexion march 10 x 3" Hooklying TrA/PPT + RTB alternating hip AD/ER 10 x 3" Bridge + RTB hip ABD isometric 8 x 3-5"  NEUROMUSCULAR RE-EDUCATION: To improve balance, proprioception, and reduce fall risk. Corner balance progression with narrow BOS on firm surface with arms crossed on chest  Eyes open: - static stance x 30 sec - horiz head turns x 5 - vertical head nods x 5 - trunk rotation x 5 Eyes closed: - static stance x 10 sec   05/08/22  Initial eval FGA: 16/30 Supine double knee to chest stretch 1x30 sec Seated QL Stretch R/L 1x30 sec Seated Lumbar Forward Flexion Stretch 1x30 sec   PATIENT EDUCATION:  Education details: PT eval findings, anticipated POC, ongoing PT POC, and initial HEP Person educated: Patient Education method: Explanation, Demonstration, Tactile cues, Verbal cues, and Handouts Education comprehension: verbalized understanding and returned demonstration  HOME EXERCISE PROGRAM: Access Code: [email or web address] URL: [email or web address] Date: 05/15/2022 Prepared by: Annie Paras  Exercises - Seated Flexion Stretch  - 1 x daily - 7 x weekly - 3 reps - 30 sec hold - Seated Quadratus Lumborum Stretch in Chair  - 1 x daily - 7 x weekly - 2 reps - 30 sec hold - Supine Double Knee to Chest  - 1 x daily - 7 x weekly - 3 reps - 30 sec hold - Supine Posterior Pelvic Tilt  - 1 x daily - 7 x weekly - 2 sets - 10 reps - 5 sec hold - Supine Hip Adduction Isometric with Ball  - 2 x daily - 3-4 x weekly - 2 sets - 10 reps - 5 sec hold - Supine March with Resistance Band  - 1 x daily - 3-4 x weekly - 2 sets - 10 reps - 2-3 sec hold hold - Hooklying Isometric Clamshell  - 1 x daily - 3-4 x weekly - 2 sets - 10 reps - 3 sec hold   ASSESSMENT:  CLINICAL IMPRESSION: [place] reports good compliance initial HEP and states exercises are going well  and denies any concerns. HEP reviewed with good return demonstration and guidance provided on how to gradually increase intensity of stretches. Initiated progressive core/lumbopelvic stabilization and strengthening in hooklying with patient noting some shakiness with fatigue but no increased pain, although limited tolerance for bridges increased burning sensation noted.  HEP progressed to include basic lumbopelvic strengthening, excluding bridges.  Introduced Therapist, sports activities to address concerns regarding unsteadiness on feet, and explained purpose of activity in regards to facilitating balance reaction strategies.  Minor loss of balance noted as activities progressed.  OBJECTIVE IMPAIRMENTS: Abnormal gait, decreased balance, decreased coordination, decreased endurance, decreased knowledge of condition, decreased knowledge of use of DME, decreased mobility, difficulty walking, decreased ROM, decreased strength, decreased safety awareness, hypomobility, increased fascial restrictions, impaired perceived functional ability, impaired flexibility, impaired sensation, impaired vision/preception, postural dysfunction, and pain.   ACTIVITY LIMITATIONS: carrying, lifting, bending, sitting, standing, squatting, stairs, and locomotion level  PARTICIPATION LIMITATIONS: meal prep, cleaning, laundry, driving, shopping, community activity, occupation, and dancing  PERSONAL FACTORS: Age, Past/current experiences, Time since onset of injury/illness/exacerbation, and 3+ comorbidities: Breast cancer with reconstruction surgery, GERD, DDD (cervical/lumbar), depression, L5-S1 Lumbar fusion/laminectomy, Dyspnea, Fibromyalgia  are also affecting patient's functional outcome.   REHAB POTENTIAL: Good  CLINICAL DECISION MAKING: Evolving/moderate complexity  EVALUATION COMPLEXITY: Moderate   GOALS: Goals reviewed with patient? No  SHORT TERM GOALS: Target date: 05/29/22  Pt will be independent with initial HEP.   Baseline: Goal status: IN PROGRESS  LONG TERM GOALS: Target date: 06/19/22  Pt will be independent with advanced/ongoing HEP to improve outcomes/carryover.  Baseline:  Goal status: IN PROGRESS  2.  Pt will report at least 50-75% improvement in low back and R radiating LE pain to improve QOL.  Baseline:  Goal  status: IN PROGRESS  3.  Pt will score </= 28% on Modified Oswestry to demonstrate improved functional ability. Baseline: 20 / 50 = 40.0 % Goal status: IN PROGRESS  4.  Pt will score at least >/= 22/30 on FGA to decrease risk of falls.  Baseline: 16/30 Goal status: IN PROGRESS  5.  Pt will score </= 54%  on ABC scale to demonstrate improved functional ability. Baseline: 1180 / 1600 = 73.8 % Goal status: IN PROGRESS  6.  Pt will be able to ascend/descend stairs with 1 HR and reciprocal step pattern safely to access home and community.    Baseline:  Goal status: IN PROGRESS  7.  Pt will amb >15 minutes with normal gait pattern without increased pain to access community.   Baseline:  Goal status: IN PROGRESS  8.  Pt will perform 10 MWT in <12.5 sec to safely walk in the community to improve QOL. Baseline: 13.21 sec Goal status: IN PROGRESS   PLAN:  PT FREQUENCY: 2x/week  PT DURATION: 6 weeks  PLANNED INTERVENTIONS: Therapeutic exercises, Therapeutic activity, Neuromuscular re-education, Balance training, Gait training, Patient/Family education, Self Care, Joint mobilization, Stair training, DME instructions, Aquatic Therapy, Dry Needling, Electrical stimulation, Spinal manipulation, Spinal mobilization, Cryotherapy, Moist heat, Vasopneumatic device, Traction, Ultrasound, Ionotophoresis /ml Dexamethasone, Manual therapy, and Re-evaluation.  PLAN FOR NEXT SESSION:  lumbar mobility - flexion preference exercises, lumbopelvic and proximal hip muscle strengthening, dynamic balance, stairs, gait   Marry Guan, PT 05/15/2022, 3:14 PM   PHYSICAL THERAPY DISCHARGE  SUMMARY  Visits from Start of Care: 2  Current functional level related to goals / functional outcomes:   Refer to above clinical impression and goal assessment for status as of last visit on 05/15/2022.  As of 05/30/2022, patient cancelled all of her remaining appointments, stating she would be having back surgery, therefore will proceed with discharge from PT for this episode.     Remaining deficits:   As above. Unable to formally assess status at discharge due to failure to return to PT.    Education / Equipment:   HEP   Patient agrees to discharge. Patient goals were not met. Patient is being discharged due to not returning since the last visit.  Marry Guan, PT, MPT 09/03/22, 11:56 AM  [facility] [address]  [address] [place], [place], [address] Phone: [phone]   Fax:  [phone]

## 2022-05-22 ENCOUNTER — Ambulatory Visit: Payer: Medicare HMO

## 2022-05-28 ENCOUNTER — Ambulatory Visit: Payer: Medicare HMO

## 2022-05-28 DIAGNOSIS — H25013 Cortical age-related cataract, bilateral: Secondary | ICD-10-CM | POA: Diagnosis not present

## 2022-05-28 DIAGNOSIS — H47531 Disorders of visual pathways in (due to) vascular disorders, right side: Secondary | ICD-10-CM | POA: Diagnosis not present

## 2022-05-28 DIAGNOSIS — H53481 Generalized contraction of visual field, right eye: Secondary | ICD-10-CM | POA: Diagnosis not present

## 2022-05-28 DIAGNOSIS — H2513 Age-related nuclear cataract, bilateral: Secondary | ICD-10-CM | POA: Diagnosis not present

## 2022-05-30 DIAGNOSIS — M48062 Spinal stenosis, lumbar region with neurogenic claudication: Secondary | ICD-10-CM | POA: Diagnosis not present

## 2022-05-30 DIAGNOSIS — M4126 Other idiopathic scoliosis, lumbar region: Secondary | ICD-10-CM | POA: Diagnosis not present

## 2022-05-30 DIAGNOSIS — M5416 Radiculopathy, lumbar region: Secondary | ICD-10-CM | POA: Diagnosis not present

## 2022-05-30 DIAGNOSIS — M4326 Fusion of spine, lumbar region: Secondary | ICD-10-CM | POA: Diagnosis not present

## 2022-05-31 ENCOUNTER — Ambulatory Visit: Payer: Medicare HMO

## 2022-06-04 ENCOUNTER — Encounter: Payer: Medicare HMO | Admitting: Physical Therapy

## 2022-06-07 ENCOUNTER — Ambulatory Visit (INDEPENDENT_AMBULATORY_CARE_PROVIDER_SITE_OTHER): Payer: Medicare HMO | Admitting: Family

## 2022-06-07 ENCOUNTER — Encounter: Payer: Self-pay | Admitting: Family

## 2022-06-07 VITALS — BP 138/78 | HR 105 | Temp 99.5°F | Resp 18 | Ht 65.0 in | Wt 184.2 lb

## 2022-06-07 DIAGNOSIS — J209 Acute bronchitis, unspecified: Secondary | ICD-10-CM | POA: Diagnosis not present

## 2022-06-07 MED ORDER — PREDNISONE 20 MG PO TABS: ORAL_TABLET | ORAL | 0 refills | Status: DC

## 2022-06-07 MED ORDER — PROMETHAZINE-DM 6.25-15 MG/5ML PO SYRP: 5.0000 mL | ORAL_SOLUTION | Freq: Four times a day (QID) | ORAL | 0 refills | Status: DC | PRN

## 2022-06-07 MED ORDER — DOXYCYCLINE HYCLATE 100 MG PO TABS: 100.0000 mg | ORAL_TABLET | Freq: Two times a day (BID) | ORAL | 0 refills | Status: DC

## 2022-06-07 NOTE — Progress Notes (Signed)
[name] is a 77 y.o. female with the following history as recorded in EpicCare:  Patient Active Problem List   Diagnosis Date Noted   Arthritis of left knee 12/26/2021   Allergy 40/98/1191   Lichen sclerosus 47/82/9562   Dizziness 10/27/2020   Vitamin B12 deficiency    Mild anxiety    Kidney stone    History of hiatal hernia    History of chicken pox    GERD (gastroesophageal reflux disease)    Gastric polyps    Dyspnea    Depression    Complication of anesthesia    Chronic back pain    Blood dyscrasia    Arthritis    Ankle effusion, left 03/23/2020   Rheumatoid arthritis ([place]) 11/02/2018   Episode of recurrent major depressive disorder ([place]) 12/05/2017   Primary osteoarthritis of both hands 07/09/2017   Primary osteoarthritis of both knees 07/09/2017   DDD (degenerative disc disease), cervical 07/09/2017   DDD (degenerative disc disease), lumbar 07/09/2017   Peripheral edema 11/26/2016   Medicare annual wellness visit, subsequent 06/30/2016   Hx of adenomatous polyp of colon 07/03/2015   Viral URI with cough 13/12/6576   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   Breast cancer, left breast ([place]) 07/28/2014   Family history of breast cancer    Family history of colon cancer    Family history of pancreatic cancer    Malignant neoplasm of upper-outer quadrant of left breast in female, estrogen receptor positive ([place]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   Peripheral neuropathy 01/01/2013   Melanoma of upper arm ([place]) 02/10/2012   S/P laparoscopic fundoplication 46/96/2952   Steatosis of liver 12/07/2009   COAGULOPATHY 07/12/2008   Melanoma ([place]) 2010   CHEST XRAY, ABNORMAL 01/04/2008   Chronic cough 12/07/2007   Vitamin D deficiency 04/14/2007   VITAMIN B12 DEFICIENCY 02/27/2007   Essential hypertension 02/27/2007   GERD 02/27/2007   Fibromyalgia 02/27/2007   Breast cancer, right breast ([place]) 1995    Current Outpatient Medications  Medication Sig Dispense Refill   Alirocumab  (PRALUENT) 75 MG/ML SOAJ Inject 1 mL into the skin every 14 (fourteen) days. 6 mL 1   amLODipine (NORVASC) 5 MG tablet Take 1 tablet (5 mg total) by mouth daily. 90 tablet 3   citalopram (CELEXA) 20 MG tablet TAKE ONE TABLET BY MOUTH EVERY MORNING 90 tablet 1   doxycycline (VIBRA-TABS) 100 MG tablet Take 1 tablet (100 mg total) by mouth 2 (two) times daily. 14 tablet 0   furosemide (LASIX) 40 MG tablet TAKE ONE TABLET BY MOUTH EVERY MORNING 90 tablet 1   isosorbide mononitrate (IMDUR) 30 MG 24 hr tablet TAKE ONE TABLET BY MOUTH EVERY EVENING 90 tablet 3   omeprazole (PRILOSEC) 40 MG capsule Take 1 capsule (40 mg total) by mouth daily. 90 capsule 3   predniSONE (DELTASONE) 20 MG tablet Take 1 tablet po bid x 2 days, then 1 tablet daily x 5 days 9 tablet 0   promethazine-dextromethorphan (PROMETHAZINE-DM) 6.25-15 MG/5ML syrup Take 5 mLs by mouth 4 (four) times daily as needed for cough. 118 mL 0   pregabalin (LYRICA) 50 MG capsule Take 50 mg by mouth 2 (two) times daily. (Patient not taking: Reported on 04/24/2022)     No current facility-administered medications for this visit.    Allergies: Other, Silodosin, Ace inhibitors, Buprenorphine hcl, Morphine and related, Codeine, Sulfonamide derivatives, and Telmisartan  Past Medical History:  Diagnosis Date   Allergy    Ankle effusion, left 03/23/2020   Arthritis    "  spine" (07/28/2014)   Blood dyscrasia    bruises and bleed easily   Breast cancer, left breast ([place]) 07/28/2014   Breast cancer, right breast ([place]) [place], ABNORMAL 01/04/2008   Qualifier: Diagnosis of  By: Plotnikov MD, Evie Lacks    Chronic back pain    "all over"   Chronic cough 12/07/2007   Could be related to GERD - relapsed    COAGULOPATHY 07/12/2008   Qualifier: Diagnosis of  By: Nils Pyle CMA (AAMA), Leisha     Complication of anesthesia    went to sleep easily but hard to wake up up until elbow OR in 2010   DDD (degenerative disc disease), cervical 07/09/2017    DDD (degenerative disc disease), lumbar 07/09/2017   With spinal stenosis.   Dr. Trenton Gammon   Depression    Dizziness 10/27/2020   Dyspnea    Normal Spirometry 03/2008 EF 65% BNP normal 11/2007   Episode of recurrent major depressive disorder ([place]) 12/05/2017   Essential hypertension 02/27/2007   Mild Off meds 2013  BP Readings from Last 3 Encounters:  02/10/12 128/90  08/09/11 134/62  03/06/11 150/86      Family history of breast cancer    Family history of colon cancer    Family history of pancreatic cancer    Fibromyalgia 02/27/2007   Chronic    Gastric polyps    GERD 02/27/2007   S/p Nissen's 2010    GERD (gastroesophageal reflux disease)    History of chicken pox    History of hiatal hernia    Hx of adenomatous polyp of colon 07/03/2015   Kidney stone    right kidney    Lichen sclerosus    Malignant neoplasm of upper-outer quadrant of left breast in female, estrogen receptor positive ([place]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   Medicare annual wellness visit, subsequent 06/30/2016   Melanoma ([place]) 2010   "right elbow; [facility] at [facility]"   Melanoma of upper arm ([place]) 02/10/2012   2010 R  Elbow ([facility])   Mild anxiety    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   Peripheral edema 11/26/2016   Peripheral neuropathy 01/01/2013   Primary osteoarthritis of both hands 07/09/2017   Primary osteoarthritis of both knees 07/09/2017   Chondromalacia patella   Rheumatoid arthritis ([place]) 11/02/2018   S/P laparoscopic fundoplication 27/25/3664   Steatosis of liver 12/07/2009   Qualifier: Diagnosis of  By: Surface RN, Donna     Viral URI with cough 09/11/2014   Vitamin B12 deficiency    VITAMIN B12 DEFICIENCY 02/27/2007   Chronic    Vitamin D deficiency     Past Surgical History:  Procedure Laterality Date   BREAST BIOPSY Left 04/2014   BREAST RECONSTRUCTION WITH PLACEMENT OF TISSUE EXPANDER AND FLEX HD (ACELLULAR HYDRATED DERMIS) Left 07/28/2014   Procedure: LEFT BREAST RECONSTRUCTION PLACEMENT OF LEFT TISSUE  EXPANDER ;  Surgeon: Crissie Reese, MD;  Location: [facility];  Service: Plastics;  Laterality: Left;   BREAST RECONSTRUCTION WITH PLACEMENT OF TISSUE EXPANDER AND FLEX HD (ACELLULAR HYDRATED DERMIS) Left 09/08/2014   Procedure: REMOVAL OF TISSUE EXPANDER FROM LEFT BREAST;  Surgeon: Crissie Reese, MD;  Location: [facility];  Service: Plastics;  Laterality: Left;   BUNIONECTOMY Bilateral 1970's   COLONOSCOPY     Dr Sharlett Iles   CYSTOSCOPY WITH RETROGRADE PYELOGRAM, URETEROSCOPY AND STENT PLACEMENT Right 06/09/2015   Procedure: CYSTOSCOPY WITH RIGHT RETROGRADE PYELOGRAM, RIGHT URETEROSCOPY AND RIGHT URTERAL STENT PLACEMENT;  Surgeon: Ardis Hughs, MD;  [facility];  Service: Urology;  Laterality: Right;   ESOPHAGOGASTRODUODENOSCOPY     HERNIA REPAIR     HOLMIUM LASER APPLICATION Right 06/20/5807   Procedure: HOLMIUM LASER APPLICATION;  Surgeon: Ardis Hughs, MD;  [facility];  Service: Urology;  Laterality: Right;   LATISSIMUS FLAP TO BREAST Left 09/08/2014   Procedure: LEFT LATISSIMUS FLAP TO BREAST WITH SALINE IMPLANT FOR BREAST RECONSTRUCTION;  Surgeon: Crissie Reese, MD;  Location: [facility];  Service: Plastics;  Laterality: Left;   LUMBAR FUSION  01/2018   L5-S1 transitional segmental laminectomy and Fusion   MASTECTOMY Right 1996    chemotherapy. pt. states 13 lymph nodes were removed   MASTECTOMY COMPLETE / SIMPLE W/ SENTINEL NODE BIOPSY Left 07/28/2014   MASTECTOMY W/ SENTINEL NODE BIOPSY Left 07/28/2014   Procedure: LEFT MASTECTOMY WITH SENTINEL LYMPH NODE MAPPING;  Surgeon: Autumn Messing III, MD;  Location: [facility];  Service: General;  Laterality: Left;   MELANOMA EXCISION Right 2010   From elbow-- Done at [facility]  98/3382   RECONSTRUCTION BREAST IMMEDIATE / DELAYED W/ TISSUE EXPANDER Left 07/28/2014   TEMPOROMANDIBULAR JOINT SURGERY Bilateral 1987   TONSILLECTOMY      Family History  Problem Relation Age of Onset   Colon cancer Cousin 12       double first cousin    Colon cancer Cousin 69       double first cousin   Colon polyps Father        between 42-20   Dementia Father    CAD Father        CABG at age 63   Stroke Mother    Pancreatic cancer Brother 75   Colon polyps Brother        between 3-20   Cancer Paternal Uncle        NOS   Anemia Paternal Grandfather        pernicious anemia   Breast cancer Cousin 33       double first cousin   Breast cancer Cousin 30       paternal cousin   Stroke Other        F 1st degree relative 48, M 1st degree relative   Esophageal cancer Neg Hx    Stomach cancer Neg Hx     Social History   Tobacco Use   Smoking status: Never   Smokeless tobacco: Never   Tobacco comments:    Regular Exercise - Yes  Substance Use Topics   Alcohol use: No    Subjective:   Concerns for sinus infection; prone to recurrent sinus infections; symptoms x 4-5 days; concerned that cough is worsening; has been having to sleep sitting upright in recliner;   Objective:  Vitals:   06/07/22 1529  BP: 138/78  Pulse: (!) 105  Resp: 18  Temp: 99.5 F (37.5 C)  TempSrc: Oral  SpO2: 96%  Weight: 184 lb 3.2 oz (83.6 kg)  Height: '5\' 5"'$  (1.651 m)    General: Well developed, well nourished, in no acute distress  Skin : Warm and dry.  Head: Normocephalic and atraumatic  Eyes: Sclera and conjunctiva clear; pupils round and reactive to light; extraocular movements intact  Ears: External normal; canals clear; tympanic membranes normal  Oropharynx: Pink, supple. No suspicious lesions  Neck: Supple without thyromegaly, adenopathy  Lungs: Respirations unlabored; coarse breath sounds in all 4 lobes;  CVS exam: normal rate and regular rhythm.  Neurologic: Alert and oriented; speech intact; face symmetrical; moves  all extremities well; CNII-XII intact without focal deficit   Assessment:  1. Acute bronchitis, unspecified organism     Plan:  Rx for Doxycyline, Prednisone and Promethazine DM; increase fluids, rest and follow up  worse, no better; will need to consider CXR if symptoms persist.   No follow-ups on file.  No orders of the defined types were placed in this encounter.   Requested Prescriptions   Signed Prescriptions Disp Refills   promethazine-dextromethorphan (PROMETHAZINE-DM) 6.25-15 MG/5ML syrup 118 mL 0    Sig: Take 5 mLs by mouth 4 (four) times daily as needed for cough.   doxycycline (VIBRA-TABS) 100 MG tablet 14 tablet 0    Sig: Take 1 tablet (100 mg total) by mouth 2 (two) times daily.   predniSONE (DELTASONE) 20 MG tablet 9 tablet 0    Sig: Take 1 tablet po bid x 2 days, then 1 tablet daily x 5 days

## 2022-06-10 ENCOUNTER — Other Ambulatory Visit: Payer: Self-pay | Admitting: Family

## 2022-06-10 ENCOUNTER — Ambulatory Visit (HOSPITAL_BASED_OUTPATIENT_CLINIC_OR_DEPARTMENT_OTHER)
Admission: RE | Admit: 2022-06-10 | Discharge: 2022-06-10 | Disposition: A | Discharge status: Home / Self Care | Payer: Medicare HMO | Source: Ambulatory Visit | Attending: Family | Admitting: Family

## 2022-06-10 ENCOUNTER — Telehealth: Payer: Self-pay | Admitting: Family

## 2022-06-10 DIAGNOSIS — R059 Cough, unspecified: Secondary | ICD-10-CM | POA: Insufficient documentation

## 2022-06-10 DIAGNOSIS — R062 Wheezing: Secondary | ICD-10-CM

## 2022-06-10 NOTE — Telephone Encounter (Signed)
Patient states she's not feeling any better since her 01/12 and would like to get an x-ray of her chest done. Please advise.

## 2022-06-10 NOTE — Telephone Encounter (Signed)
Spoke with Pt she is aware that she is able to come in for her CXR.

## 2022-06-11 ENCOUNTER — Encounter: Payer: Medicare HMO | Admitting: Physical Therapy

## 2022-06-12 DIAGNOSIS — M5116 Intervertebral disc disorders with radiculopathy, lumbar region: Secondary | ICD-10-CM | POA: Diagnosis not present

## 2022-06-12 DIAGNOSIS — M4854XA Collapsed vertebra, not elsewhere classified, thoracic region, initial encounter for fracture: Secondary | ICD-10-CM | POA: Diagnosis not present

## 2022-06-12 DIAGNOSIS — Z981 Arthrodesis status: Secondary | ICD-10-CM | POA: Diagnosis not present

## 2022-06-12 DIAGNOSIS — M48061 Spinal stenosis, lumbar region without neurogenic claudication: Secondary | ICD-10-CM | POA: Diagnosis not present

## 2022-06-14 ENCOUNTER — Other Ambulatory Visit (HOSPITAL_COMMUNITY): Payer: Self-pay

## 2022-06-17 ENCOUNTER — Other Ambulatory Visit (HOSPITAL_COMMUNITY): Payer: Self-pay

## 2022-06-18 ENCOUNTER — Telehealth: Payer: Self-pay

## 2022-06-18 DIAGNOSIS — E7800 Pure hypercholesterolemia, unspecified: Secondary | ICD-10-CM

## 2022-06-18 DIAGNOSIS — R931 Abnormal findings on diagnostic imaging of heart and coronary circulation: Secondary | ICD-10-CM

## 2022-06-18 DIAGNOSIS — I25118 Atherosclerotic heart disease of native coronary artery with other forms of angina pectoris: Secondary | ICD-10-CM

## 2022-06-18 NOTE — Telephone Encounter (Signed)
Pharmacy Patient Advocate Encounter  Prior Authorization for Repatha '140MG'$ /ML has been approved.    KEY# [id number] Effective dates: 1.1.24 through 12.31.24  Please note that Rx Praluent is no longer a covered medication on the patient formulary.A current Rx for Repatha will need to be sent into the patient preferred  pharmacy.

## 2022-06-25 DIAGNOSIS — M4126 Other idiopathic scoliosis, lumbar region: Secondary | ICD-10-CM | POA: Diagnosis not present

## 2022-06-25 DIAGNOSIS — M7918 Myalgia, other site: Secondary | ICD-10-CM | POA: Diagnosis not present

## 2022-06-25 DIAGNOSIS — M5416 Radiculopathy, lumbar region: Secondary | ICD-10-CM | POA: Diagnosis not present

## 2022-06-25 DIAGNOSIS — M4326 Fusion of spine, lumbar region: Secondary | ICD-10-CM | POA: Diagnosis not present

## 2022-06-25 DIAGNOSIS — M48062 Spinal stenosis, lumbar region with neurogenic claudication: Secondary | ICD-10-CM | POA: Diagnosis not present

## 2022-06-25 MED ORDER — REPATHA SURECLICK 140 MG/ML ~~LOC~~ SOAJ: 1.0000 mL | SUBCUTANEOUS | 3 refills | Status: DC

## 2022-06-25 NOTE — Telephone Encounter (Signed)
Rx for Repatha sent to pharmacy. Called patient and she is aware.

## 2022-06-29 DIAGNOSIS — R609 Edema, unspecified: Secondary | ICD-10-CM | POA: Diagnosis not present

## 2022-06-29 DIAGNOSIS — M1990 Unspecified osteoarthritis, unspecified site: Secondary | ICD-10-CM | POA: Diagnosis not present

## 2022-06-29 DIAGNOSIS — R2681 Unsteadiness on feet: Secondary | ICD-10-CM | POA: Diagnosis not present

## 2022-06-29 DIAGNOSIS — K219 Gastro-esophageal reflux disease without esophagitis: Secondary | ICD-10-CM | POA: Diagnosis not present

## 2022-06-29 DIAGNOSIS — Z008 Encounter for other general examination: Secondary | ICD-10-CM | POA: Diagnosis not present

## 2022-06-29 DIAGNOSIS — E669 Obesity, unspecified: Secondary | ICD-10-CM | POA: Diagnosis not present

## 2022-06-29 DIAGNOSIS — J302 Other seasonal allergic rhinitis: Secondary | ICD-10-CM | POA: Diagnosis not present

## 2022-06-29 DIAGNOSIS — F324 Major depressive disorder, single episode, in partial remission: Secondary | ICD-10-CM | POA: Diagnosis not present

## 2022-06-29 DIAGNOSIS — I10 Essential (primary) hypertension: Secondary | ICD-10-CM | POA: Diagnosis not present

## 2022-06-29 DIAGNOSIS — L309 Dermatitis, unspecified: Secondary | ICD-10-CM | POA: Diagnosis not present

## 2022-06-29 DIAGNOSIS — M4800 Spinal stenosis, site unspecified: Secondary | ICD-10-CM | POA: Diagnosis not present

## 2022-06-29 DIAGNOSIS — E785 Hyperlipidemia, unspecified: Secondary | ICD-10-CM | POA: Diagnosis not present

## 2022-06-29 DIAGNOSIS — R32 Unspecified urinary incontinence: Secondary | ICD-10-CM | POA: Diagnosis not present

## 2022-07-02 NOTE — Progress Notes (Unsigned)
Cardiology Office Note:    Date:  07/03/2022   ID:  [name], DOB February 14, 1946, MRN [id number]  PCP:  Marrian Salvage, Ryan  Cardiologist:  Shirlee More, MD    Referring MD: Marrian Salvage,*    ASSESSMENT:    1. Coronary artery disease of native artery of native heart with stable angina pectoris ([place])   2. [place] coronary artery calcium score less than 100   3. Essential hypertension   4. Pure hypercholesterolemia    PLAN:    In order of problems listed above:  [name] does well with her coronary disease having no anginal discomfort and will continue treatment including aspirin lipid-lowering with Repatha and oral mononitrate along with calcium channel blocker.  She wants to follow-up in [place]. Today check CMP lipid profile and screen for LP(a) I asked her to get in the habit of checking her blood pressure is good technique validated device right it down and bring it to appointments in the future as she tends to run significantly less at home and in office   Next appointment: 1 year   Medication Adjustments/Labs and Tests Ordered: Current medicines are reviewed at length with the patient today.  Concerns regarding medicines are outlined above.  No orders of the defined types were placed in this encounter.  No orders of the defined types were placed in this encounter.   Chief Complaint  Patient presents with   Follow-up    History of Present Illness:    [name] is a 77 y.o. female with a hx of CAD coronary artery calcium score of 63/55th percentile hypertension hyperlipidemia and stroke last seen 10/25/2021.  Coronary CT calcium score reported 11/09/2020 showed calcium score of 63 55th percentile and moderate stenosis in the first diagonal and proximal ramus artery.  Subsequent FFR was normal in both vessels.   Echocardiogram 10/27/2020 showed moderate left ventricular hypertrophy ejection fraction normal 60 to 01% normal diastolic filling  pressure the right ventricle is normal in size and function and aortic valve sclerosis without stenosis.   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  She has felt to have a hypertensive urgency she was initiated on antihypertensive therapy with amlodipine subsequent MRI of the brain did not show infarction  Compliance with diet, lifestyle and medications: Yes  She feels well when she checks her blood pressure intermittently she runs 1 75-1 30 systolic She tolerates her PCSK9 inhibitor without side effects we will do a lipid profile LPA and cmp today She has had no angina edema shortness of breath palpitation or syncope Last lipid profile 08/06/2021 shows an LDL of 61 cholesterol 137 triglycerides 194. Past Medical History:  Diagnosis Date   Allergy    Ankle effusion, left 03/23/2020   Arthritis    "spine" (07/28/2014)   Blood dyscrasia    bruises and bleed easily   Breast cancer, left breast ([place]) 07/28/2014   Breast cancer, right breast ([place]) [place], ABNORMAL 01/04/2008   Qualifier: Diagnosis of  By: Plotnikov MD, Evie Lacks    Chronic back pain    "all over"   Chronic cough 12/07/2007   Could be related to GERD - relapsed    COAGULOPATHY 07/12/2008   Qualifier: Diagnosis of  By: Nils Pyle CMA (AAMA), Leisha     Complication of anesthesia    went to  sleep easily but hard to wake up up until elbow OR in 2010   DDD (degenerative disc disease), cervical 07/09/2017   DDD (degenerative disc disease), lumbar 07/09/2017   With spinal stenosis.   Dr. Trenton Gammon   Depression    Dizziness 10/27/2020   Dyspnea    Normal Spirometry 03/2008 EF 65% BNP normal 11/2007   Episode of recurrent major depressive disorder ([place]) 12/05/2017   Essential hypertension 02/27/2007   Mild Off meds 2013  BP Readings from Last 3 Encounters:  02/10/12 128/90  08/09/11 134/62   03/06/11 150/86      Family history of breast cancer    Family history of colon cancer    Family history of pancreatic cancer    Fibromyalgia 02/27/2007   Chronic    Gastric polyps    GERD 02/27/2007   S/p Nissen's 2010    GERD (gastroesophageal reflux disease)    History of chicken pox    History of hiatal hernia    Hx of adenomatous polyp of colon 07/03/2015   Kidney stone    right kidney    Lichen sclerosus    Malignant neoplasm of upper-outer quadrant of left breast in female, estrogen receptor positive ([place]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   Medicare annual wellness visit, subsequent 06/30/2016   Melanoma ([place]) 2010   "right elbow; [facility]"   Melanoma of upper arm ([place]) 02/10/2012   2010 R  Elbow ([facility])   Mild anxiety    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   Peripheral edema 11/26/2016   Peripheral neuropathy 01/01/2013   Primary osteoarthritis of both hands 07/09/2017   Primary osteoarthritis of both knees 07/09/2017   Chondromalacia patella   Rheumatoid arthritis ([place]) 11/02/2018   S/P laparoscopic fundoplication 19/50/9326   Steatosis of liver 12/07/2009   Qualifier: Diagnosis of  By: Surface RN, Donna     Viral URI with cough 09/11/2014   Vitamin B12 deficiency    VITAMIN B12 DEFICIENCY 02/27/2007   Chronic    Vitamin D deficiency     Past Surgical History:  Procedure Laterality Date   BREAST BIOPSY Left 04/2014   BREAST RECONSTRUCTION WITH PLACEMENT OF TISSUE EXPANDER AND FLEX HD (ACELLULAR HYDRATED DERMIS) Left 07/28/2014   Procedure: LEFT BREAST RECONSTRUCTION PLACEMENT OF LEFT TISSUE EXPANDER ;  Surgeon: Crissie Reese, MD;  Location: [facility];  Service: Plastics;  Laterality: Left;   BREAST RECONSTRUCTION WITH PLACEMENT OF TISSUE EXPANDER AND FLEX HD (ACELLULAR HYDRATED DERMIS) Left 09/08/2014   Procedure: REMOVAL OF TISSUE EXPANDER FROM LEFT BREAST;  Surgeon: Crissie Reese, MD;  Location: [place];  Service: Plastics;  Laterality: Left;   BUNIONECTOMY Bilateral 1970's    COLONOSCOPY     Dr Sharlett Iles   CYSTOSCOPY WITH RETROGRADE PYELOGRAM, URETEROSCOPY AND STENT PLACEMENT Right 06/09/2015   Procedure: CYSTOSCOPY WITH RIGHT RETROGRADE PYELOGRAM, RIGHT URETEROSCOPY AND RIGHT URTERAL STENT PLACEMENT;  Surgeon: Ardis Hughs, MD;  [facility];  Service: Urology;  Laterality: Right;   ESOPHAGOGASTRODUODENOSCOPY     HERNIA REPAIR     HOLMIUM LASER APPLICATION Right 12/06/4578   Procedure: HOLMIUM LASER APPLICATION;  Surgeon: Ardis Hughs, MD;  [facility];  Service: Urology;  Laterality: Right;   LATISSIMUS FLAP TO BREAST Left 09/08/2014   Procedure: LEFT LATISSIMUS FLAP TO BREAST WITH SALINE IMPLANT FOR BREAST RECONSTRUCTION;  Surgeon: Crissie Reese, MD;  Location: [facility];  Service: Plastics;  Laterality: Left;   LUMBAR FUSION  01/2018   L5-S1 transitional segmental laminectomy and Fusion  MASTECTOMY Right 1996    chemotherapy. pt. states 13 lymph nodes were removed   MASTECTOMY COMPLETE / SIMPLE W/ SENTINEL NODE BIOPSY Left 07/28/2014   MASTECTOMY W/ SENTINEL NODE BIOPSY Left 07/28/2014   Procedure: LEFT MASTECTOMY WITH SENTINEL LYMPH NODE MAPPING;  Surgeon: Autumn Messing III, MD;  Location: [facility];  Service: General;  Laterality: Left;   MELANOMA EXCISION Right 2010   From elbow-- Done at [facility]  58/5277   RECONSTRUCTION BREAST IMMEDIATE / DELAYED W/ TISSUE EXPANDER Left 07/28/2014   TEMPOROMANDIBULAR JOINT SURGERY Bilateral 1987   TONSILLECTOMY      Current Medications: Current Meds  Medication Sig   amLODipine (NORVASC) 5 MG tablet Take 1 tablet (5 mg total) by mouth daily.   aspirin EC 81 MG tablet Take 81 mg by mouth daily. Swallow whole.   citalopram (CELEXA) 20 MG tablet TAKE ONE TABLET BY MOUTH EVERY MORNING   Evolocumab (REPATHA SURECLICK) 824 MG/ML SOAJ Inject 140 mg into the skin every 14 (fourteen) days.   furosemide (LASIX) 40 MG tablet TAKE ONE TABLET BY MOUTH EVERY MORNING   isosorbide mononitrate (IMDUR) 30  MG 24 hr tablet TAKE ONE TABLET BY MOUTH EVERY EVENING   omeprazole (PRILOSEC) 40 MG capsule Take 1 capsule (40 mg total) by mouth daily.     Allergies:   Other, Silodosin, Ace inhibitors, Buprenorphine hcl, Morphine and related, Codeine, Sulfonamide derivatives, and Telmisartan   Social History   Socioeconomic History   Marital status: Married    Spouse name: Not on file   Number of children: 2   Years of education: Not on file   Highest education level: Not on file  Occupational History   Occupation: Freight forwarder  Tobacco Use   Smoking status: Never   Smokeless tobacco: Never   Tobacco comments:    Regular Exercise - Yes  Vaping Use   Vaping Use: Never used  Substance and Sexual Activity   Alcohol use: No   Drug use: No   Sexual activity: Yes  Other Topics Concern   Not on file  Social History Narrative   She lives with husband.     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 in the Last Year: Never true  Transportation Needs: No Transportation Needs (09/04/2021)   PRAPARE - Hydrologist (Medical): No    Lack of Transportation (Non-Medical): No  Physical Activity: Inactive (09/04/2021)   Exercise Vital Sign    Days of Exercise per Week: 0 days    Minutes of Exercise per Session: 0 min  Stress: No Stress Concern Present (09/04/2021)   [place]    Feeling of Stress : Not at all  Social Connections: Moderately Integrated  (09/04/2021)   Social Connection and Isolation Panel [NHANES]    Frequency of Communication with Friends and Family: More than  three times a week    Frequency of Social Gatherings with Friends and Family: More than three times a week    Attends Religious Services: More than 4 times per year    Active Member of [employer] or Organizations: No    Attends Music therapist: Never    Marital Status: Married     Family History: The patient's family history includes Anemia in her paternal grandfather; Breast cancer (age of onset: 15) in her cousin; Breast cancer (age of onset: 73) in her cousin; CAD in her father; Cancer in her paternal uncle; Colon cancer (age of onset: 56) in her cousin; Colon cancer (age of onset: 36) in her cousin; Colon polyps in her brother and father; Dementia in her father; Pancreatic cancer (age of onset: 3) in her brother; Stroke in her mother and another family member. There is no history of Esophageal cancer or Stomach cancer. ROS:   Please see the history of present illness.    All other systems reviewed and are negative.  EKGs/Labs/Other Studies Reviewed:    The following studies were reviewed today:  EKG: I did not repeat an EKG today done last 10/25/2021  Recent Labs: 02/01/2022: ALT 15; BUN 15; Creat 0.98; Hemoglobin 14.0; Magnesium 1.9; Platelets 183; Potassium 3.9; Sodium 141; TSH 2.61  Recent Lipid Panel    Component Value Date/Time   CHOL 137 08/06/2021 1453   TRIG 194 (H) 08/06/2021 1453   HDL 44 08/06/2021 1453   CHOLHDL 3.1 08/06/2021 1453   CHOLHDL 4.6 10/27/2020 0050   VLDL 16 10/27/2020 0050   LDLCALC 61 08/06/2021 1453   LDLDIRECT 179.7 02/10/2012 0920    Physical Exam:    VS:  BP (!) 140/80 (BP Location: Left Arm, Patient Position: Sitting, Cuff Size: Normal)   Pulse 76   Ht '5\' 5"'$  (1.651 m)   Wt 181 lb (82.1 kg)   SpO2 96%   BMI 30.12 kg/m     Wt Readings from Last 3 Encounters:  07/03/22 181 lb (82.1 kg)  06/07/22 184 lb  3.2 oz (83.6 kg)  04/24/22 186 lb (84.4 kg)     GEN:  Well nourished, well developed in no acute distress HEENT: Normal NECK: No JVD; No carotid bruits LYMPHATICS: No lymphadenopathy CARDIAC: RRR, no murmurs, rubs, gallops RESPIRATORY:  Clear to auscultation without rales, wheezing or rhonchi  ABDOMEN: Soft, non-tender, non-distended MUSCULOSKELETAL:  No edema; No deformity  SKIN: Warm and dry NEUROLOGIC:  Alert and oriented x 3 PSYCHIATRIC:  Normal affect  Seen with Tiffany GarNer, CMA chaperone   Signed, Shirlee More, MD  07/03/2022 3:14 PM    [facility]

## 2022-07-03 ENCOUNTER — Other Ambulatory Visit: Payer: Self-pay | Admitting: Family

## 2022-07-03 ENCOUNTER — Ambulatory Visit: Payer: Medicare HMO | Attending: Cardiology | Admitting: Cardiology

## 2022-07-03 ENCOUNTER — Encounter: Payer: Self-pay | Admitting: Cardiology

## 2022-07-03 VITALS — BP 140/80 | HR 76 | Ht 65.0 in | Wt 181.0 lb

## 2022-07-03 DIAGNOSIS — R931 Abnormal findings on diagnostic imaging of heart and coronary circulation: Secondary | ICD-10-CM | POA: Diagnosis not present

## 2022-07-03 DIAGNOSIS — I25118 Atherosclerotic heart disease of native coronary artery with other forms of angina pectoris: Secondary | ICD-10-CM

## 2022-07-03 DIAGNOSIS — E7800 Pure hypercholesterolemia, unspecified: Secondary | ICD-10-CM | POA: Diagnosis not present

## 2022-07-03 DIAGNOSIS — I10 Essential (primary) hypertension: Secondary | ICD-10-CM

## 2022-07-03 DIAGNOSIS — F419 Anxiety disorder, unspecified: Secondary | ICD-10-CM

## 2022-07-03 DIAGNOSIS — R609 Edema, unspecified: Secondary | ICD-10-CM

## 2022-07-03 NOTE — Patient Instructions (Signed)
Medication Instructions:  Your physician recommends that you continue on your current medications as directed. Please refer to the Current Medication list given to you today.  *If you need a refill on your cardiac medications before your next appointment, please call your pharmacy*   Lab Work: Your physician recommends that you return for lab work in:   Labs today: CMP, Lipids, LPa  If you have labs (blood work) drawn today and your tests are completely normal, you will receive your results only by: [place] (if you have [place]) OR A paper copy in the mail If you have any lab test that is abnormal or we need to change your treatment, we will call you to review the results.   Testing/Procedures: None   Follow-Up: At [facility], you and your health needs are our priority.  As part of our continuing mission to provide you with exceptional heart care, we have created designated Provider Care Teams.  These Care Teams include your primary Cardiologist (physician) and Advanced Practice Providers (APPs -  Physician Assistants and Nurse Practitioners) who all work together to provide you with the care you need, when you need it.  We recommend signing up for the patient portal called "MyChart".  Sign up information is provided on this After Visit Summary.  MyChart is used to connect with patients for Virtual Visits (Telemedicine).  Patients are able to view lab/test results, encounter notes, upcoming appointments, etc.  Non-urgent messages can be sent to your provider as well.   To learn more about what you can do with MyChart, go to [email or web address].    Your next appointment:   1 year(s)  Provider:   Dr. Lauree Chandler MD  Other Instructions None  This visit was accompanied by Leighton Ruff.

## 2022-07-04 LAB — COMPREHENSIVE METABOLIC PANEL
ALT: 15 IU/L (ref 0–32)
AST: 14 IU/L (ref 0–40)
Albumin/Globulin Ratio: 2 (ref 1.2–2.2)
Albumin: 4.5 g/dL (ref 3.8–4.8)
Alkaline Phosphatase: 88 IU/L (ref 44–121)
BUN/Creatinine Ratio: 18 (ref 12–28)
BUN: 16 mg/dL (ref 8–27)
Bilirubin Total: 0.4 mg/dL (ref 0.0–1.2)
CO2: 24 mmol/L (ref 20–29)
Calcium: 9.5 mg/dL (ref 8.7–10.3)
Chloride: 102 mmol/L (ref 96–106)
Creatinine, Ser: 0.91 mg/dL (ref 0.57–1.00)
Globulin, Total: 2.3 g/dL (ref 1.5–4.5)
Glucose: 87 mg/dL (ref 70–99)
Potassium: 4.2 mmol/L (ref 3.5–5.2)
Sodium: 141 mmol/L (ref 134–144)
Total Protein: 6.8 g/dL (ref 6.0–8.5)
eGFR: 65 mL/min/{1.73_m2} (ref >59)

## 2022-07-04 LAB — LIPID PANEL
Chol/HDL Ratio: 3.1 ratio (ref 0.0–4.4)
Cholesterol, Total: 153 mg/dL (ref 100–199)
HDL: 50 mg/dL (ref >39)
LDL Chol Calc (NIH): 86 mg/dL (ref 0–99)
Triglycerides: 90 mg/dL (ref 0–149)
VLDL Cholesterol Cal: 17 mg/dL (ref 5–40)

## 2022-07-04 LAB — LIPOPROTEIN A (LPA): Lipoprotein (a): 28.6 nmol/L (ref <75.0)

## 2022-07-23 DIAGNOSIS — M48062 Spinal stenosis, lumbar region with neurogenic claudication: Secondary | ICD-10-CM | POA: Diagnosis not present

## 2022-07-30 ENCOUNTER — Other Ambulatory Visit: Payer: Self-pay | Admitting: Cardiology

## 2022-08-06 DIAGNOSIS — N898 Other specified noninflammatory disorders of vagina: Secondary | ICD-10-CM | POA: Diagnosis not present

## 2022-08-09 DIAGNOSIS — M4724 Other spondylosis with radiculopathy, thoracic region: Secondary | ICD-10-CM | POA: Diagnosis not present

## 2022-08-12 ENCOUNTER — Telehealth (INDEPENDENT_AMBULATORY_CARE_PROVIDER_SITE_OTHER): Payer: Medicare HMO | Admitting: Family Medicine

## 2022-08-12 DIAGNOSIS — R6889 Other general symptoms and signs: Secondary | ICD-10-CM

## 2022-08-12 MED ORDER — OSELTAMIVIR PHOSPHATE 75 MG PO CAPS: 75.0000 mg | ORAL_CAPSULE | Freq: Two times a day (BID) | ORAL | 0 refills | Status: DC

## 2022-08-12 NOTE — Progress Notes (Signed)
[place] at [facility] [address] [name], [place] [address] [phone]  Date:  08/12/2022   Name:  [name]   DOB:  11-24-1945   MRN:  [id number]  PCP:  Marrian Salvage, FNP    Chief Complaint: No chief complaint on file.   History of Present Illness:  [name] is a 77 y.o. very pleasant female patient who presents with the following:  Pt seen today virtually for exposure to influenza and associated symptoms Primary pt of [name] Mourning FNP  Connected with patient via video monitor, video used for entirety of visit today Pt location is home, my location is office Patient identified problem with 2 factors.  She gives permission for a virtual visit today.  The patient and myself are present on the call  History of rheumatoid arthritis, back pain, GERD, breast cancer, neuropathy, melanoma   Pt notes her daughter was dx with flu last week and started taking Tamiflu, she is feeling much better Pt got sick yesterday- she notes sx of diarrhea and vomiting, feeling weak, she is not sure about a fever-has not checked She has noted a cough for about 3 weeks; this does not seem related to her current illness She notes no vomiting so far today, but did have 4 bm so far No abd pain  She is still taking liquids, notes her urine has not become dark  Pt notes her daughter also had diarrhea and vomiting with her flu sx   She has not taking any vitals    Patient Active Problem List   Diagnosis Date Noted   Arthritis of left knee 12/26/2021   Allergy [id number]   Lichen sclerosus [id number]   Dizziness 10/27/2020   Vitamin B12 deficiency    Mild anxiety    Kidney stone    History of hiatal hernia    History of chicken pox    GERD (gastroesophageal reflux disease)    Gastric polyps    Dyspnea    Depression    Complication of anesthesia    Chronic back pain    Blood dyscrasia    Arthritis    Ankle effusion,  left 03/23/2020   Rheumatoid arthritis ([place]) 11/02/2018   Episode of recurrent major depressive disorder ([place]) 12/05/2017   Primary osteoarthritis of both hands 07/09/2017   Primary osteoarthritis of both knees 07/09/2017   DDD (degenerative disc disease), cervical 07/09/2017   DDD (degenerative disc disease), lumbar 07/09/2017   Peripheral edema 11/26/2016   Medicare annual wellness visit, subsequent 06/30/2016   Hx of adenomatous polyp of colon 07/03/2015   Viral URI with cough 0000000   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   Breast cancer, left breast ([name]) 07/28/2014   Family history of breast cancer    Family history of colon cancer    Family history of pancreatic cancer    Malignant neoplasm of upper-outer quadrant of left breast in female, 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   Peripheral neuropathy 01/01/2013   Melanoma of upper arm ([name]) 02/10/2012   S/P laparoscopic fundoplication [id number]   Steatosis of liver 12/07/2009   COAGULOPATHY 07/12/2008   Melanoma ([name]) 2010   CHEST XRAY, ABNORMAL 01/04/2008   Chronic cough 12/07/2007   Vitamin D deficiency 04/14/2007   VITAMIN B12 DEFICIENCY 02/27/2007   Essential hypertension 02/27/2007   GERD 02/27/2007   Fibromyalgia 02/27/2007   Breast cancer, right breast ([place]) 1995    Past Medical  History:  Diagnosis Date   Allergy    Ankle effusion, left 03/23/2020   Arthritis    "spine" (07/28/2014)   Blood dyscrasia    bruises and bleed easily   Breast cancer, left breast ([name]) 07/28/2014   Breast cancer, right breast ([name]) [place], ABNORMAL 01/04/2008   Qualifier: Diagnosis of  By: Plotnikov MD, Evie Lacks    Chronic back pain    "all over"   Chronic cough 12/07/2007   Could be related to GERD - relapsed    COAGULOPATHY 07/12/2008   Qualifier: Diagnosis of  By: Nils Pyle CMA (AAMA), Leisha     Complication of anesthesia    went to sleep easily but hard to wake up up until elbow OR in 2010    DDD (degenerative disc disease), cervical 07/09/2017   DDD (degenerative disc disease), lumbar 07/09/2017   With spinal stenosis.   Dr. Trenton Gammon   Depression    Dizziness 10/27/2020   Dyspnea    Normal Spirometry 03/2008 EF 65% BNP normal 11/2007   Episode of recurrent major depressive disorder ([place]) 12/05/2017   Essential hypertension 02/27/2007   Mild Off meds 2013  BP Readings from Last 3 Encounters:  02/10/12 128/90  08/09/11 134/62  03/06/11 150/86      Family history of breast cancer    Family history of colon cancer    Family history of pancreatic cancer    Fibromyalgia 02/27/2007   Chronic    Gastric polyps    GERD 02/27/2007   S/p Nissen's 2010    GERD (gastroesophageal reflux disease)    History of chicken pox    History of hiatal hernia    Hx of adenomatous polyp of colon 07/03/2015   Kidney stone    right kidney    Lichen sclerosus    Malignant neoplasm of upper-outer quadrant of left breast in female, 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   Medicare annual wellness visit, subsequent 06/30/2016   Melanoma ([name]) 2010   "right elbow; [facility]"   Melanoma of upper arm ([name]) 02/10/2012   2010 R  Elbow ([name])   Mild anxiety    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   Peripheral edema 11/26/2016   Peripheral neuropathy 01/01/2013   Primary osteoarthritis of both hands 07/09/2017   Primary osteoarthritis of both knees 07/09/2017   Chondromalacia patella   Rheumatoid arthritis ([name]) 11/02/2018   S/P laparoscopic fundoplication [id number]   Steatosis of liver 12/07/2009   Qualifier: Diagnosis of  By: Surface RN, Donna     Viral URI with cough 09/11/2014   Vitamin B12 deficiency    VITAMIN B12 DEFICIENCY 02/27/2007   Chronic    Vitamin D deficiency     Past Surgical History:  Procedure Laterality Date   BREAST BIOPSY Left 04/2014   BREAST RECONSTRUCTION WITH PLACEMENT OF TISSUE EXPANDER AND FLEX HD (ACELLULAR HYDRATED DERMIS) Left 07/28/2014    Procedure: LEFT BREAST RECONSTRUCTION PLACEMENT OF LEFT TISSUE EXPANDER ;  Surgeon: Crissie Reese, MD;  Location: [place];  Service: Plastics;  Laterality: Left;   BREAST RECONSTRUCTION WITH PLACEMENT OF TISSUE EXPANDER AND FLEX HD (ACELLULAR HYDRATED DERMIS) Left 09/08/2014   Procedure: REMOVAL OF TISSUE EXPANDER FROM LEFT BREAST;  Surgeon: Crissie Reese, MD;  Location: [name];  Service: Plastics;  Laterality: Left;   BUNIONECTOMY Bilateral 1970's   COLONOSCOPY     Dr Sharlett Iles   CYSTOSCOPY WITH RETROGRADE PYELOGRAM, URETEROSCOPY AND STENT PLACEMENT Right 06/09/2015   Procedure: CYSTOSCOPY WITH  RIGHT RETROGRADE PYELOGRAM, RIGHT URETEROSCOPY AND RIGHT URTERAL STENT PLACEMENT;  Surgeon: Ardis Hughs, MD;  Location: [facility];  Service: Urology;  Laterality: Right;   ESOPHAGOGASTRODUODENOSCOPY     HERNIA REPAIR     HOLMIUM LASER APPLICATION Right [id number]   Procedure: HOLMIUM LASER APPLICATION;  Surgeon: Ardis Hughs, MD;  Location: [facility];  Service: Urology;  Laterality: Right;   LATISSIMUS FLAP TO BREAST Left 09/08/2014   Procedure: LEFT LATISSIMUS FLAP TO BREAST WITH SALINE IMPLANT FOR BREAST RECONSTRUCTION;  Surgeon: Crissie Reese, MD;  Location: [place];  Service: Plastics;  Laterality: Left;   LUMBAR FUSION  01/2018   L5-S1 transitional segmental laminectomy and Fusion   MASTECTOMY Right 1996    chemotherapy. pt. states 13 lymph nodes were removed   MASTECTOMY COMPLETE / SIMPLE W/ SENTINEL NODE BIOPSY Left 07/28/2014   MASTECTOMY W/ SENTINEL NODE BIOPSY Left 07/28/2014   Procedure: LEFT MASTECTOMY WITH SENTINEL LYMPH NODE MAPPING;  Surgeon: Autumn Messing III, MD;  Location: [place];  Service: General;  Laterality: Left;   MELANOMA EXCISION Right 2010   From elbow-- Done at [facility]  [id number]   RECONSTRUCTION BREAST IMMEDIATE / DELAYED W/ TISSUE EXPANDER Left 07/28/2014   TEMPOROMANDIBULAR JOINT SURGERY Bilateral 1987   TONSILLECTOMY      Social History   Tobacco Use    Smoking status: Never   Smokeless tobacco: Never   Tobacco comments:    Regular Exercise - Yes  Vaping Use   Vaping Use: Never used  Substance Use Topics   Alcohol use: No   Drug use: No    Family History  Problem Relation Age of Onset   Colon cancer Cousin 1       double first cousin   Colon cancer Cousin 57       double first cousin   Colon polyps Father        between 49-20   Dementia Father    CAD Father        CABG at age 66   Stroke Mother    Pancreatic cancer Brother 44   Colon polyps Brother        between 59-20   Cancer Paternal Uncle        NOS   Anemia Paternal Grandfather        pernicious anemia   Breast cancer Cousin 27       double first cousin   Breast cancer Cousin 70       paternal cousin   Stroke Other        F 1st degree relative 3, M 1st degree relative   Esophageal cancer Neg Hx    Stomach cancer Neg Hx     Allergies  Allergen Reactions   Other Anaphylaxis and Swelling    Glue (eye lash glue, gorilla glue)   Silodosin Rash    Facial rash   Ace Inhibitors Other (See Comments)    REACTION: angioedema   Buprenorphine Hcl Other (See Comments)    "crazy"   Morphine And Related Other (See Comments)    "crazy" "crazy"   Codeine Rash   Sulfonamide Derivatives Rash   Telmisartan Rash    Medication list has been reviewed and updated.  Current Outpatient Medications on File Prior to Visit  Medication Sig Dispense Refill   amLODipine (NORVASC) 5 MG tablet Take 1 tablet (5 mg total) by mouth daily. 90 tablet 2   aspirin EC 81 MG tablet Take 81  mg by mouth daily. Swallow whole.     citalopram (CELEXA) 20 MG tablet TAKE ONE TABLET BY MOUTH EVERY MORNING 90 tablet 1   Evolocumab (REPATHA SURECLICK) [id number] MG/ML SOAJ Inject 140 mg into the skin every 14 (fourteen) days. 6 mL 3   furosemide (LASIX) 40 MG tablet TAKE ONE TABLET BY MOUTH EVERY MORNING 90 tablet 1   isosorbide mononitrate (IMDUR) 30 MG 24 hr tablet TAKE ONE TABLET BY MOUTH EVERY  EVENING 90 tablet 3   omeprazole (PRILOSEC) 40 MG capsule Take 1 capsule (40 mg total) by mouth daily. 90 capsule 3   No current facility-administered medications on file prior to visit.    Review of Systems:  As per HPI- otherwise negative.   Physical Examination: There were no vitals filed for this visit. There were no vitals filed for this visit. There is no height or weight on file to calculate BMI. Ideal Body Weight:    Patient observed via video monitor.  She looks well, no distress.  Assessment and Plan: Flu-like symptoms - Plan: oseltamivir (TAMIFLU) 75 MG capsule  Patient seen today virtually with concern of flulike symptoms.  Her symptoms are more gastrointestinal in character, however she notes her daughter had the flu last week and she also had mostly GI symptoms.  We discussed if she may need to be seen in person, potentially go to the ER for fluids.  Patient notes she is feeling improved today compared with yesterday, she is still drinking fluids and declines to come in at this time.  She will however seek care in person if she gets worse again or fails to continue improving.  I prescribed Tamiflu to her desired pharmacy  Signed Lamar Blinks, MD

## 2022-08-22 ENCOUNTER — Ambulatory Visit (INDEPENDENT_AMBULATORY_CARE_PROVIDER_SITE_OTHER): Payer: Medicare HMO | Admitting: Nurse Practitioner

## 2022-08-22 ENCOUNTER — Encounter: Payer: Self-pay | Admitting: Nurse Practitioner

## 2022-08-22 VITALS — BP 116/78 | HR 86 | Temp 98.8°F | Resp 19 | Ht 65.0 in | Wt 183.4 lb

## 2022-08-22 DIAGNOSIS — R0789 Other chest pain: Secondary | ICD-10-CM | POA: Diagnosis not present

## 2022-08-22 DIAGNOSIS — R1013 Epigastric pain: Secondary | ICD-10-CM

## 2022-08-22 DIAGNOSIS — K921 Melena: Secondary | ICD-10-CM

## 2022-08-22 DIAGNOSIS — R195 Other fecal abnormalities: Secondary | ICD-10-CM

## 2022-08-22 MED ORDER — PANTOPRAZOLE SODIUM 40 MG PO TBEC: 40.0000 mg | DELAYED_RELEASE_TABLET | Freq: Every day | ORAL | 0 refills | Status: DC

## 2022-08-22 NOTE — Assessment & Plan Note (Addendum)
She has been having ongoing epigastric pain and worsening reflux since having the flu 2 weeks ago. No red flags on exam. EKG showed normal sinus rhythm. She did have some black stools after taking 1 dose of peptobismol. Will have her check hemoccult and bring cards back to office. Will also check CMP, CBC today. Will switch omeprazole to pantoprazole 40mg  daily. Encouraged her to reach out to GI to schedule an appointment. She does have a history of Nissen fundoplication and was vomiting with recent flu, may be related to worsening abdominal pain. Follow-up with PCP in 2 weeks or sooner with worsening symptoms.

## 2022-08-22 NOTE — Progress Notes (Signed)
Acute Office Visit  Subjective:     Patient ID: [name], female    DOB: 09/11/1945, 77 y.o.   MRN: [id number]  Chief Complaint  Patient presents with   Fatigue    Pt states" She has fatigue, stomach pain when eating and belching.  She also noticed her stools are dark but took pepto    HPI Patient is in today for ongoing fatigue and stomach pain since having the flu 2 weeks ago.   She states that she was treated with tamiflu. She states that on Tuesday, she started having burning epigastric abdominal pain. The pain fluctuates and it radiates around her back. She does have a history of nissen surgery. She states that she was vomiting with the flu, even though she didn't have nausea. The pain gets worse with eating, but will also increase without eating. She took peptobismol which didn't help. She then had black stools for several days, then this morning it was brown again. She is taking omeprazole 40mg  daily. She is still having fatigue and dizziness. She denies chest pain, but feels like an elephant is sitting on her chest.   ROS See pertinent positives and negatives per HPI.     Objective:    BP 116/78 (BP Location: Left Arm, Cuff Size: Normal)   Pulse 86   Temp 98.8 F (37.1 C) (Oral)   Resp 19   Ht 5\' 5"  (1.651 m)   Wt 183 lb 6.4 oz (83.2 kg)   SpO2 96%   BMI 30.52 kg/m    Physical Exam Vitals and nursing note reviewed.  Constitutional:      General: She is not in acute distress.    Appearance: Normal appearance.  HENT:     Head: Normocephalic.     Right Ear: Tympanic membrane, ear canal and external ear normal.     Left Ear: Tympanic membrane, ear canal and external ear normal.  Eyes:     Conjunctiva/sclera: Conjunctivae normal.  Cardiovascular:     Rate and Rhythm: Normal rate and regular rhythm.     Pulses: Normal pulses.     Heart sounds: Normal heart sounds.  Pulmonary:     Effort: Pulmonary effort is normal.     Breath sounds: Normal breath sounds.   Abdominal:     General: Bowel sounds are normal. There is no distension.     Palpations: Abdomen is soft. There is no mass.     Tenderness: There is abdominal tenderness (epigastric). There is no guarding or rebound.     Hernia: No hernia is present.  Musculoskeletal:     Cervical back: Normal range of motion and neck supple. No tenderness.  Lymphadenopathy:     Cervical: No cervical adenopathy.  Skin:    General: Skin is warm.  Neurological:     General: No focal deficit present.     Mental Status: She is alert and oriented to person, place, and time.  Psychiatric:        Mood and Affect: Mood normal.        Behavior: Behavior normal.        Thought Content: Thought content normal.        Judgment: Judgment normal.       Assessment & Plan:   Problem List Items Addressed This Visit       Other   Epigastric pain - Primary    She has been having ongoing epigastric pain and worsening reflux since having the flu 2  weeks ago. No red flags on exam. EKG showed normal sinus rhythm. She did have some black stools after taking 1 dose of peptobismol. Will have her check hemoccult and bring cards back to office. Will also check CMP, CBC today. Will switch omeprazole to pantoprazole 40mg  daily. Encouraged her to reach out to GI to schedule an appointment. She does have a history of Nissen fundoplication and was vomiting with recent flu, may be related to worsening abdominal pain. Follow-up with PCP in 2 weeks or sooner with worsening symptoms.       Other Visit Diagnoses     Chest pressure       EKG showed normal sinus rhythm, no ST or T wave changes similar to prior EKGs. Most likely related to recent worsening of reflux. Check CMP, CBC today.   Relevant Orders   CBC with Differential/Platelet   Comprehensive metabolic panel   EKG [id number]   Black stool       Black stool x4 days after 1 dose of peptobismol. Not sure related at this point. Will check CBC and hemoccult. F/U based on  results.   Relevant Orders   Hemoccult Cards (X3 cards)       Meds ordered this encounter  Medications   pantoprazole (PROTONIX) 40 MG tablet    Sig: Take 1 tablet (40 mg total) by mouth daily.    Dispense:  90 tablet    Refill:  0    Return if symptoms worsen or fail to improve.  Charyl Dancer, NP

## 2022-08-22 NOTE — Patient Instructions (Signed)
It was great to see you!  We are checking your labs today and will let you know the results via mychart/phone.   EKG looks normal  Call your GI and schedule an appointment.  I want to switch from omeprazole to pantoprazole   Bring the stool cards back to our office Monday-Friday 8am-5pm  Let's follow-up if your symptoms worsen or don't improve.   Take care,  Vance Peper, NP

## 2022-08-23 LAB — CBC WITH DIFFERENTIAL/PLATELET
Absolute Monocytes: 574 cells/uL (ref 200–950)
Basophils Absolute: 63 cells/uL (ref 0–200)
Basophils Relative: 0.9 %
Eosinophils Absolute: 385 cells/uL (ref 15–500)
Eosinophils Relative: 5.5 %
HCT: 42.7 % (ref 35.0–45.0)
Hemoglobin: 14.4 g/dL (ref 11.7–15.5)
Lymphs Abs: 1939 cells/uL (ref 850–3900)
MCH: 30.3 pg (ref 27.0–33.0)
MCHC: 33.7 g/dL (ref 32.0–36.0)
MCV: 89.7 fL (ref 80.0–100.0)
MPV: 11.4 fL (ref 7.5–12.5)
Monocytes Relative: 8.2 %
Neutro Abs: 4039 cells/uL (ref 1500–7800)
Neutrophils Relative %: 57.7 %
Platelets: 247 10*3/uL (ref 140–400)
RBC: 4.76 10*6/uL (ref 3.80–5.10)
RDW: 12.5 % (ref 11.0–15.0)
Total Lymphocyte: 27.7 %
WBC: 7 10*3/uL (ref 3.8–10.8)

## 2022-08-23 LAB — COMPREHENSIVE METABOLIC PANEL
AG Ratio: 1.9 (calc) (ref 1.0–2.5)
ALT: 15 U/L (ref 6–29)
AST: 17 U/L (ref 10–35)
Albumin: 4.2 g/dL (ref 3.6–5.1)
Alkaline phosphatase (APISO): 67 U/L (ref 37–153)
BUN: 13 mg/dL (ref 7–25)
CO2: 26 mmol/L (ref 20–32)
Calcium: 9.2 mg/dL (ref 8.6–10.4)
Chloride: 104 mmol/L (ref 98–110)
Creat: 0.78 mg/dL (ref 0.60–1.00)
Globulin: 2.2 g/dL (calc) (ref 1.9–3.7)
Glucose, Bld: 91 mg/dL (ref 65–99)
Potassium: 3.9 mmol/L (ref 3.5–5.3)
Sodium: 142 mmol/L (ref 135–146)
Total Bilirubin: 0.5 mg/dL (ref 0.2–1.2)
Total Protein: 6.4 g/dL (ref 6.1–8.1)

## 2022-08-26 ENCOUNTER — Other Ambulatory Visit: Payer: Medicare HMO

## 2022-08-26 ENCOUNTER — Telehealth: Payer: Self-pay | Admitting: Internal Medicine

## 2022-08-26 LAB — HEMOCCULT SLIDES (X 3 CARDS): OCCULT 1: POSITIVE — AB

## 2022-08-26 NOTE — Addendum Note (Signed)
Addended by: Vance Peper A on: 08/26/2022 05:01 PM   Modules accepted: Orders

## 2022-08-26 NOTE — Telephone Encounter (Signed)
Patient is calling wishing to speak with a nurse in regards to having a positive test for blood in her stool and is not sure if she should wait until main to be seen. Please advise

## 2022-08-27 NOTE — Telephone Encounter (Signed)
Pt stated that she recently had a positive test for blood in her stool. Chart reviewed. Hemoccult Card Positive. Hgb 14.4. Pt stated that she has been having black dark stools, Taking no iron, no Pepto bismol. Pt complains of generalized abdominal pain. Pt was scheduled for an office visit with Nicoletta Ba PA  on 09/18/2022 at 1:30 PM. Pt made aware.  Pt notified that if she would start to experience any  chest pain, shortness of breath, Dizziness, Lightheadedness then she should go to the ED.  Pt verbalized understanding with all questions answered.

## 2022-08-29 ENCOUNTER — Telehealth: Payer: Self-pay | Admitting: Family

## 2022-08-29 NOTE — Telephone Encounter (Signed)
Contacted [name] to schedule their annual wellness visit. Appointment made for 05/14/2023.  Sherol Dade; Care Guide Amb[facility] Direct Dial: [phone]

## 2022-09-09 ENCOUNTER — Encounter: Payer: Self-pay | Admitting: *Deleted

## 2022-09-12 ENCOUNTER — Encounter: Payer: Self-pay | Admitting: Family Medicine

## 2022-09-12 ENCOUNTER — Ambulatory Visit (INDEPENDENT_AMBULATORY_CARE_PROVIDER_SITE_OTHER): Payer: Medicare HMO

## 2022-09-12 ENCOUNTER — Ambulatory Visit (INDEPENDENT_AMBULATORY_CARE_PROVIDER_SITE_OTHER): Payer: Medicare HMO | Admitting: Family Medicine

## 2022-09-12 VITALS — BP 139/82 | HR 82 | Ht 65.0 in | Wt 183.0 lb

## 2022-09-12 VITALS — BP 128/56 | HR 87 | Temp 98.3°F | Resp 12 | Ht 65.0 in | Wt 182.8 lb

## 2022-09-12 DIAGNOSIS — M79661 Pain in right lower leg: Secondary | ICD-10-CM | POA: Diagnosis not present

## 2022-09-12 DIAGNOSIS — Z0000 Encounter for general adult medical examination without abnormal findings: Secondary | ICD-10-CM

## 2022-09-12 DIAGNOSIS — M79662 Pain in left lower leg: Secondary | ICD-10-CM | POA: Diagnosis not present

## 2022-09-12 DIAGNOSIS — Z1231 Encounter for screening mammogram for malignant neoplasm of breast: Secondary | ICD-10-CM | POA: Diagnosis not present

## 2022-09-12 DIAGNOSIS — Z780 Asymptomatic menopausal state: Secondary | ICD-10-CM | POA: Diagnosis not present

## 2022-09-12 LAB — D-DIMER, QUANTITATIVE: D-Dimer, Quant: 0.53 mcg/mL FEU — ABNORMAL HIGH (ref <0.50)

## 2022-09-12 MED ORDER — MELOXICAM 15 MG PO TABS: 15.0000 mg | ORAL_TABLET | Freq: Every day | ORAL | 0 refills | Status: DC

## 2022-09-12 NOTE — Patient Instructions (Signed)
D-dimer today to rule out clot. If positive, will order ultrasound.  Starting meloxicam for inflammation and arthritis history (do not combine with other antiinflammatories like ibuprofen or Aleve) If not improving after 2-3 week trial or symptoms worsen before then, please let us know so we can further evaluate.

## 2022-09-12 NOTE — Progress Notes (Signed)
Subjective:   [name] is a 77 y.o. female who presents for Medicare Annual (Subsequent) preventive examination.  I connected with  Philmore Pali on 09/12/22 by a audio enabled telemedicine application and verified that I am speaking with the correct person using two identifiers.  Patient Location: Home  Provider Location: Home Office  I discussed the limitations of evaluation and management by telemedicine. The patient expressed understanding and agreed to proceed.       Review of Systems     Cardiac Risk Factors include: advanced age (>36men, >110 women);dyslipidemia;hypertension;obesity (BMI >30kg/m2)     Objective:    Today's Vitals   09/12/22 1504 09/12/22 1505  BP: 139/82   Pulse: 82   Weight: 183 lb (83 kg)   Height: 5\' 5"  (1.651 m)   PainSc:  6    Body mass index is 30.45 kg/m.     09/12/2022    3:13 PM 05/08/2022    8:07 AM 04/24/2022    8:21 AM 09/04/2021    3:15 PM 10/26/2020    9:58 AM 02/28/2020    2:21 PM 02/17/2019    9:54 AM  Advanced Directives  Does Patient Have a Medical Advance Directive? Yes Yes Yes Yes Yes Yes   Type of Estate agent of [place];Living will Healthcare Power of Powers;Living will Healthcare Power of [place];Living will;Out of facility DNR (pink MOST or yellow form) Healthcare Power of Attorney Living will;Healthcare Power of [employer] Power of [place];Living will Healthcare Power of [place];Living will  Does patient want to make changes to medical advance directive? No - Patient declined No - Patient declined   No - Patient declined    Copy of Healthcare Power of Attorney in Chart? Yes - validated most recent copy scanned in chart (See row information) Yes - validated most recent copy scanned in chart (See row information)  Yes - validated most recent copy scanned in chart (See row information) No - copy requested Yes - validated most recent copy scanned in chart (See row information) Yes -  validated most recent copy scanned in chart (See row information)  Would patient like information on creating a medical advance directive?     No - Patient declined      Current Medications (verified) Outpatient Encounter Medications as of 09/12/2022  Medication Sig   amLODipine (NORVASC) 5 MG tablet Take 1 tablet (5 mg total) by mouth daily.   aspirin EC 81 MG tablet Take 81 mg by mouth daily. Swallow whole.   cholecalciferol (VITAMIN D3) 25 MCG (1000 UNIT) tablet Take 1,000 Units by mouth daily. Patient takes 2000 units   citalopram (CELEXA) 20 MG tablet TAKE ONE TABLET BY MOUTH EVERY MORNING   Cyanocobalamin (VITAMIN B-12) 5000 MCG TBDP Take by mouth.   Evolocumab (REPATHA SURECLICK) 140 MG/ML SOAJ Inject 140 mg into the skin every 14 (fourteen) days.   furosemide (LASIX) 40 MG tablet TAKE ONE TABLET BY MOUTH EVERY MORNING   isosorbide mononitrate (IMDUR) 30 MG 24 hr tablet TAKE ONE TABLET BY MOUTH EVERY EVENING   pantoprazole (PROTONIX) 40 MG tablet Take 1 tablet (40 mg total) by mouth daily.   oseltamivir (TAMIFLU) 75 MG capsule Take 1 capsule (75 mg total) by mouth 2 (two) times daily. (Patient not taking: Reported on 08/22/2022)   No facility-administered encounter medications on file as of 09/12/2022.    Allergies (verified) Other, Silodosin, Ace inhibitors, Buprenorphine hcl, Morphine and related, Codeine, Sulfonamide derivatives, and Telmisartan   History:  Past Medical History:  Diagnosis Date   Allergy    Ankle effusion, left 03/23/2020   Arthritis    "spine" (07/28/2014)   Blood dyscrasia    bruises and bleed easily   Breast cancer, left breast 07/28/2014   Breast cancer, right breast 1995   CHEST XRAY, ABNORMAL 01/04/2008   Qualifier: Diagnosis of  By: Plotnikov MD, Georgina Quint    Chronic back pain    "all over"   Chronic cough 12/07/2007   Could be related to GERD - relapsed    COAGULOPATHY 07/12/2008   Qualifier: Diagnosis of  By: Koleen Distance CMA (AAMA), Leisha      Complication of anesthesia    went to sleep easily but hard to wake up up until elbow OR in 2010   DDD (degenerative disc disease), cervical 07/09/2017   DDD (degenerative disc disease), lumbar 07/09/2017   With spinal stenosis.   Dr. Dutch Quint   Depression    Dizziness 10/27/2020   Dyspnea    Normal Spirometry 03/2008 EF 65% BNP normal 11/2007   Episode of recurrent major depressive disorder 12/05/2017   Essential hypertension 02/27/2007   Mild Off meds 2013  BP Readings from Last 3 Encounters:  02/10/12 128/90  08/09/11 134/62  03/06/11 150/86      Family history of breast cancer    Family history of colon cancer    Family history of pancreatic cancer    Fibromyalgia 02/27/2007   Chronic    Gastric polyps    GERD 02/27/2007   S/p Nissen's 2010    GERD (gastroesophageal reflux disease)    History of chicken pox    History of hiatal hernia    Hx of adenomatous polyp of colon 07/03/2015   Kidney stone    right kidney    Lichen sclerosus    Malignant neoplasm of upper-outer quadrant of left breast in female, 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   Medicare annual wellness visit, subsequent 06/30/2016   Melanoma 2010   "right elbow; treated at [facility]"   Melanoma of upper arm 02/10/2012   2010 R  Elbow ([facility])   Mild anxiety    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   Peripheral edema 11/26/2016   Peripheral neuropathy 01/01/2013   Primary osteoarthritis of both hands 07/09/2017   Primary osteoarthritis of both knees 07/09/2017   Chondromalacia patella   Rheumatoid arthritis 11/02/2018   S/P laparoscopic fundoplication 08/09/2011   Steatosis of liver 12/07/2009   Qualifier: Diagnosis of  By: Surface RN, Donna     Viral URI with cough 09/11/2014   Vitamin B12 deficiency    VITAMIN B12 DEFICIENCY 02/27/2007   Chronic    Vitamin D deficiency    Past Surgical History:  Procedure Laterality Date   BREAST BIOPSY Left 04/2014   BREAST RECONSTRUCTION WITH PLACEMENT OF TISSUE  EXPANDER AND FLEX HD (ACELLULAR HYDRATED DERMIS) Left 07/28/2014   Procedure: LEFT BREAST RECONSTRUCTION PLACEMENT OF LEFT TISSUE EXPANDER ;  Surgeon: Etter Sjogren, MD;  Location: [facility] OR;  Service: Plastics;  Laterality: Left;   BREAST RECONSTRUCTION WITH PLACEMENT OF TISSUE EXPANDER AND FLEX HD (ACELLULAR HYDRATED DERMIS) Left 09/08/2014   Procedure: REMOVAL OF TISSUE EXPANDER FROM LEFT BREAST;  Surgeon: Etter Sjogren, MD;  Location: [facility] OR;  Service: Plastics;  Laterality: Left;   BUNIONECTOMY Bilateral 1970's   COLONOSCOPY     Dr Jarold Motto   CYSTOSCOPY WITH RETROGRADE PYELOGRAM, URETEROSCOPY AND STENT PLACEMENT Right 06/09/2015   Procedure: CYSTOSCOPY WITH RIGHT RETROGRADE PYELOGRAM, RIGHT URETEROSCOPY AND  RIGHT URTERAL STENT PLACEMENT;  Surgeon: Crist Fat, MD;  Location: [facility] ORS;  Service: Urology;  Laterality: Right;   ESOPHAGOGASTRODUODENOSCOPY     HERNIA REPAIR     HOLMIUM LASER APPLICATION Right 06/09/2015   Procedure: HOLMIUM LASER APPLICATION;  Surgeon: Crist Fat, MD;  Location: [facility] OR[facility];  Service: Urology;  Laterality: Right;   LATISSIMUS FLAP TO BREAST Left 09/08/2014   Procedure: LEFT LATISSIMUS FLAP TO BREAST WITH SALINE IMPLANT FOR BREAST RECONSTRUCTION;  Surgeon: Etter Sjogren, MD;  Location: [facility] OR;  Service: Plastics;  Laterality: Left;   LUMBAR FUSION  01/2018   L5-S1 transitional segmental laminectomy and Fusion   MASTECTOMY Right 1996    chemotherapy. pt. states 13 lymph nodes were removed   MASTECTOMY COMPLETE / SIMPLE W/ SENTINEL NODE BIOPSY Left 07/28/2014   MASTECTOMY W/ SENTINEL NODE BIOPSY Left 07/28/2014   Procedure: LEFT MASTECTOMY WITH SENTINEL LYMPH NODE MAPPING;  Surgeon: Chevis Pretty III, MD;  Location: [facility] OR;  Service: General;  Laterality: Left;   MELANOMA EXCISION Right 2010   From elbow-- Done at [facility]    NISSEN FUNDOPLICATION  09/2008   RECONSTRUCTION BREAST IMMEDIATE / DELAYED W/ TISSUE EXPANDER Left 07/28/2014   TEMPOROMANDIBULAR JOINT SURGERY Bilateral  1987   TONSILLECTOMY     Family History  Problem Relation Age of Onset   Colon cancer Cousin 109       double first cousin   Colon cancer Cousin 28       double first cousin   Colon polyps Father        between 10-20   Dementia Father    CAD Father        CABG at age 55   Stroke Mother    Pancreatic cancer Brother 79   Colon polyps Brother        between 10-20   Cancer Paternal Uncle        NOS   Anemia Paternal Grandfather        pernicious anemia   Breast cancer Cousin 51       double first cousin   Breast cancer Cousin 44       paternal cousin   Stroke Other        F 1st degree relative 13, M 1st degree relative   Esophageal cancer Neg Hx    Stomach cancer Neg Hx    Social History   Socioeconomic History   Marital status: Married    Spouse name: Not on file   Number of children: 2   Years of education: Not on file   Highest education level: Not on file  Occupational History   Occupation: Production designer, theatre/television/film  Tobacco Use   Smoking status: Never   Smokeless tobacco: Never   Tobacco comments:    Regular Exercise - Yes  Vaping Use   Vaping Use: Never used  Substance and Sexual Activity   Alcohol use: No   Drug use: No   Sexual activity: Yes  Other Topics Concern   Not on file  Social History Narrative   She lives with husband.     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         Right Handed    Lives in a one story home with a basement    Social Determinants of Health   Financial Resource Strain: Low Risk  (09/12/2022)   Overall Financial Resource Strain (CARDIA)    Difficulty  of Paying Living Expenses: Not hard at all  Food Insecurity: No Food Insecurity (09/12/2022)   Hunger Vital Sign    Worried About Running Out of Food in the Last Year: Never true    Ran Out of Food in the Last Year: Never true  Transportation Needs: No Transportation Needs (09/12/2022)   PRAPARE - Scientist, research (physical sciences) (Medical): No    Lack of Transportation (Non-Medical): No  Physical Activity: Inactive (09/12/2022)   Exercise Vital Sign    Days of Exercise per Week: 0 days    Minutes of Exercise per Session: 0 min  Stress: No Stress Concern Present (09/12/2022)   [employer] of Occupational Health - Occupational Stress Questionnaire    Feeling of Stress : Only a little  Social Connections: Socially Integrated (09/12/2022)   Social Connection and Isolation Panel [NHANES]    Frequency of Communication with Friends and Family: More than three times a week    Frequency of Social Gatherings with Friends and Family: More than three times a week    Attends Religious Services: More than 4 times per year    Active Member of [employer] or Organizations: Yes    Attends Engineer, structural: More than 4 times per year    Marital Status: Married    Tobacco Counseling Counseling given: Not Answered Tobacco comments: Regular Exercise - Yes   Clinical Intake:  Pre-visit preparation completed: Yes  Pain : 0-10 Pain Score: 6  Pain Location: Leg Pain Orientation: Left, Right Pain Onset: 1 to 4 weeks ago     BMI - recorded: 30.45 Nutritional Status: BMI > 30  Obese Nutritional Risks: None Diabetes: No  How often do you need to have someone help you when you read instructions, pamphlets, or other written materials from your doctor or pharmacy?: 1 - Never  Diabetic?No  Interpreter Needed?: No  Information entered by :: Kandis Cocking, Cma   Activities of Daily Living    09/12/2022    3:14 PM  In your present state of health, do you have any difficulty performing the following activities:  Hearing? 0  Vision? 0  Difficulty concentrating or making decisions? 0  Walking or climbing stairs? 1  Comment due to leg pain and SOB  Dressing or bathing? 0  Doing errands, shopping? 0  Preparing Food and eating ? N  Using the Toilet? N  In the past six months, have you accidently  leaked urine? Y  Do you have problems with loss of bowel control? N  Managing your Medications? N  Managing your Finances? N  Housekeeping or managing your Housekeeping? N    Patient Care Team: Olive Bass, FNP as PCP - General (Internal Medicine) Wendall Stade, MD (Cardiology) Lavina Hamman, MD as Attending Physician (Obstetrics and Gynecology) Magrinat, Valentino Hue, MD (Inactive) (Hematology and Oncology) Iva Boop, MD as Consulting Physician (Gastroenterology) Larence Penning, OD as Referring Physician (Optometry) Griselda Miner, MD as Consulting Physician (General Surgery) Crist Fat, MD as Attending Physician (Urology) Donnamarie Rossetti, MD as Consulting Physician (Dentistry) Malachy Chamber (Spine Surgery) Glenna Fellows, MD as Consulting Physician (Plastic Surgery) Dahlia Byes, [facility] (Inactive) as Pharmacist (Pharmacist) Glendale Chard, DO as Consulting Physician (Neurology)  Indicate any recent Medical Services you may have received from other than [facility] providers in the past year (date may be approximate).     Assessment:   This is a routine wellness examination for [place].  Hearing/Vision screen Hearing Screening -  Comments:: Denies hearing difficulties   Vision Screening - Comments:: Wears rx glasses - up to date with routine eye exams with  05/2022. Dr. Carlynn Purl, at [facility]  Dietary issues and exercise activities discussed: Current Exercise Habits: The patient does not participate in regular exercise at present, Exercise limited by: orthopedic condition(s)   Goals Addressed             This Visit's Progress    patient stated       Exercise more to feel better       Depression Screen    09/12/2022    3:17 PM 06/07/2022    3:32 PM 02/01/2022    2:51 PM 10/05/2021    1:04 PM 09/04/2021    3:14 PM 08/09/2021    2:55 PM 05/22/2021    9:55 AM  PHQ 2/9 Scores  PHQ - 2 Score 2 0 0 0 0 0 0  PHQ- 9 Score 2          Fall  Risk    09/12/2022    3:08 PM 08/22/2022    3:57 PM 06/07/2022    3:32 PM 04/24/2022    8:21 AM 02/01/2022    2:51 PM  Fall Risk   Falls in the past year? 1 1 1 1 1   Number falls in past yr: 1 1 1 1  0  Comment 4 or 5 falls      Injury with Fall? 1 0 0 0 1  Comment bruises      Risk for fall due to : History of fall(s);Impaired balance/gait;Orthopedic patient History of fall(s) History of fall(s)  History of fall(s);Impaired balance/gait  Follow up Education provided;Falls prevention discussed Falls evaluation completed Falls evaluation completed Falls evaluation completed Falls evaluation completed;Falls prevention discussed    FALL RISK PREVENTION PERTAINING TO THE HOME:  Any stairs in or around the home? Yes  If so, are there any without handrails? Yes Home free of loose throw rugs in walkways, pet beds, electrical cords, etc? No  Adequate lighting in your home to reduce risk of falls? Yes   ASSISTIVE DEVICES UTILIZED TO PREVENT FALLS:  Life alert? No  Use of a cane, walker or w/c? Yes  Grab bars in the bathroom? No  Shower chair or bench in shower? No  Elevated toilet seat or a handicapped toilet? No   TIMED UP AND GO:  Was the test performed? No .   Cognitive Function:    02/17/2019   12:24 PM 01/29/2018   11:15 AM 04/06/2015   10:11 AM  MMSE - Mini Mental State Exam  Orientation to time 5 5 5   Orientation to Place 5 5 5   Registration 3 3 3   Attention/ Calculation 5 5 5   Recall 3 2 3   Language- name 2 objects 2 2 2   Language- repeat 1 1 1   Language- follow 3 step command 3 3 3   Language- read & follow direction 1 1 1   Write a sentence 1 1 1   Copy design 1 1 1   Total score 30 29 30         09/12/2022    3:15 PM 09/04/2021    3:18 PM  6CIT Screen  What Year? 0 points 0 points  What month? 0 points 0 points  What time? 0 points 0 points  Count back from 20 0 points 0 points  Months in reverse 0 points 0 points  Repeat phrase 0 points 0 points  Total Score 0  points  0 points    Immunizations Immunization History  Administered Date(s) Administered   Fluad Quad(high Dose 65+) 02/17/2019   Influenza Split 02/06/2011, 02/10/2012   Influenza Whole 04/14/2007   Influenza, High Dose Seasonal PF 04/06/2015, 03/18/2017, 01/29/2018, 04/29/2022   Influenza,inj,Quad PF,6+ Mos 07/29/2014, 02/20/2016   Pneumococcal Conjugate-13 02/20/2016   Pneumococcal Polysaccharide-23 07/29/2014   Tdap 01/03/2013   Tetanus 01/04/2008   Zoster Recombinat (Shingrix) 02/14/2018, 06/02/2018    TDAP status: Up to date  Flu Vaccine status: Up to date  Pneumococcal vaccine status: Up to date  Covid-19 vaccine status: Declined, Education has been provided regarding the importance of this vaccine but patient still declined. Advised may receive this vaccine at local pharmacy or Health Dept.or vaccine clinic. Aware to provide a copy of the vaccination record if obtained from local pharmacy or Health Dept. Verbalized acceptance and understanding.  Qualifies for Shingles Vaccine? Yes   Zostavax completed No   Shingrix Completed?: Yes  Screening Tests Health Maintenance  Topic Date Due   COVID-19 Vaccine (1) Never done   INFLUENZA VACCINE  12/26/2022   DTaP/Tdap/Td (2 - Td or Tdap) 01/04/2023   Medicare Annual Wellness (AWV)  09/12/2023   COLONOSCOPY (Pts 45-56yrs Insurance coverage will need to be confirmed)  01/02/2026   Pneumonia Vaccine 44+ Years old  Completed   DEXA SCAN  Completed   Hepatitis C Screening  Completed   Zoster Vaccines- Shingrix  Completed   HPV VACCINES  Aged Out    Health Maintenance  Health Maintenance Due  Topic Date Due   COVID-19 Vaccine (1) Never done    Colorectal cancer screening: Type of screening: Colonoscopy. Completed 01-02-2021. Repeat every 5 years  Mammogram status: Ordered  . Pt provided with contact info and advised to call to schedule appt.   Bone Density status: Ordered  . Pt provided with contact info and advised to  call to schedule appt.  Lung Cancer Screening: (Low Dose CT Chest recommended if Age 52-80 years, 30 pack-year currently smoking OR have quit w/in 15years.) does not qualify.   Lung Cancer Screening Referral: N/A  Additional Screening:  Hepatitis C Screening: does qualify; Completed 07/06/2018  Vision Screening: Recommended annual ophthalmology exams for early detection of glaucoma and other disorders of the eye. Is the patient up to date with their annual eye exam?  Yes  Who is the provider or what is the name of the office in which the patient attends annual eye exams? [facility] If pt is not established with a provider, would they like to be referred to a provider to establish care? No .   Dental Screening: Recommended annual dental exams for proper oral hygiene  Community Resource Referral / Chronic Care Management: CRR required this visit?  No   CCM required this visit?  No      Plan:     I have personally reviewed and noted the following in the patient's chart:   Medical and social history Use of alcohol, tobacco or illicit drugs  Current medications and supplements including opioid prescriptions. Patient is not currently taking opioid prescriptions. Functional ability and status Nutritional status Physical activity Advanced directives List of other physicians Hospitalizations, surgeries, and ER visits in previous 12 months Vitals Screenings to include cognitive, depression, and falls Referrals and appointments  In addition, I have reviewed and discussed with patient certain preventive protocols, quality metrics, and best practice recommendations. A written personalized care plan for preventive services as well as general preventive health recommendations  were provided to patient.     Milus Mallick, CMA   09/12/2022   Nurse Notes: Mammogram order, also Dexa ordered

## 2022-09-12 NOTE — Patient Instructions (Signed)
[name] , Thank you for taking time to come for your Medicare Wellness Visit. I appreciate your ongoing commitment to your health goals. Please review the following plan we discussed and let me know if I can assist you in the future.   These are the goals we discussed:  Goals       BP <140/90      CARE PLAN ENTRY Current Barriers:  Current blood pressure regimen: Furosemide 40 mg once daily for fluid retention  BP Readings from Last 3 Encounters:  09/28/19 (!) 147/76  08/26/19 136/84  02/17/19 118/78  Current home BP readings:  8/12: 140/80, 143/80 on repeat. 5/4: 152/81,147/76 on repeat.  Pharmacist Clinical Goal(s):  Over the next 180 days, patient will work with PharmD and providers to optimize antihypertensive regimen BP <140/90 goal Interventions: Inter-disciplinary care team collaboration (see longitudinal plan of care) Comprehensive medication review performed; medication list updated in the electronic medical record.  Implement blood pressure monitoring plan.  Patient Self Care Activities:  Patient will check BP multiple times throughout the day, document, and provide at future appointments. Patient will focus on medication adherence by continuing current medication management.  Initial goal documentation.       Increase physical activity      Has silver sneakers. Plans to use this year.   Walking and water aerobics        LDL Cholesterol <100      CARE PLAN ENTRY Current Barriers:  Current antihyperlipidemic regimen: diet/exercise. Previous antihyperlipidemic medications tried Atorvastatin 10 mg daily Most recent lipid panel:     Component Value Date/Time   CHOL 197 02/17/2019 1015   TRIG 116.0 02/17/2019 1015   HDL 40.50 02/17/2019 1015   CHOLHDL 5 02/17/2019 1015   VLDL 23.2 02/17/2019 1015   LDLCALC 133 (H) 02/17/2019 1015   LDLDIRECT 179.7 02/10/2012 0920  Pharmacist Clinical Goal(s):  Over the next 180 days, patient will work with PharmD and  providers towards optimized antihyperlipidemic therapy Interventions: Comprehensive medication review performed; medication list updated in electronic medical record.  Inter-disciplinary care team collaboration  Patient Self Care Activities:  Patient will focus on medication adherence by continuing current management and going for routine labs.  Initial goal documentation.      Pain: Minimize recurring symptoms      CARE PLAN ENTRY Current Barriers:  Chronic Disease Management support, education, and care coordination needs related to pain/arthritis.  Pharmacist Clinical Goal(s):  Over next 180 days: Minimize recurring pain.  Interventions: Comprehensive medication review performed. Stop naproxen, start tylenol extra strength 500 mg two tabs every 8 hours.  Patient Self Care Activities:  Patient verbalizes understanding of plan to stop naproxen, start tylenol extra strength 500 mg two tabs every 8 hours over next 180 days.  Please call with any questions!  Initial goal documentation.      Patient Stated      Increase the walking       patient stated      Exercise more to feel better      Travel more and visit with family.   (pt-stated)        This is a list of the screening recommended for you and due dates:  Health Maintenance  Topic Date Due   COVID-19 Vaccine (1) Never done   Flu Shot  12/26/2022   DTaP/Tdap/Td vaccine (2 - Td or Tdap) 01/04/2023   Medicare Annual Wellness Visit  09/12/2023   Colon Cancer Screening  01/02/2026   Pneumonia  Vaccine  Completed   DEXA scan (bone density measurement)  Completed   Hepatitis C Screening: USPSTF Recommendation to screen - Ages 33-79 yo.  Completed   Zoster (Shingles) Vaccine  Completed   HPV Vaccine  Aged Out    Advanced directives: Per patient copy of living will is in chart  Conditions/risks identified: Keep up the good work  Next appointment: Follow up in one year for your annual wellness visit    Preventive  Care 65 Years and Older, Female Preventive care refers to lifestyle choices and visits with your health care provider that can promote health and wellness. What does preventive care include? A yearly physical exam. This is also called an annual well check. Dental exams once or twice a year. Routine eye exams. Ask your health care provider how often you should have your eyes checked. Personal lifestyle choices, including: Daily care of your teeth and gums. Regular physical activity. Eating a healthy diet. Avoiding tobacco and drug use. Limiting alcohol use. Practicing safe sex. Taking low-dose aspirin every day. Taking vitamin and mineral supplements as recommended by your health care provider. What happens during an annual well check? The services and screenings done by your health care provider during your annual well check will depend on your age, overall health, lifestyle risk factors, and family history of disease. Counseling  Your health care provider may ask you questions about your: Alcohol use. Tobacco use. Drug use. Emotional well-being. Home and relationship well-being. Sexual activity. Eating habits. History of falls. Memory and ability to understand (cognition). Work and work Astronomer. Reproductive health. Screening  You may have the following tests or measurements: Height, weight, and BMI. Blood pressure. Lipid and cholesterol levels. These may be checked every 5 years, or more frequently if you are over 50 years old. Skin check. Lung cancer screening. You may have this screening every year starting at age 21 if you have a 30-pack-year history of smoking and currently smoke or have quit within the past 15 years. Fecal occult blood test (FOBT) of the stool. You may have this test every year starting at age 19. Flexible sigmoidoscopy or colonoscopy. You may have a sigmoidoscopy every 5 years or a colonoscopy every 10 years starting at age 42. Hepatitis C blood  test. Hepatitis B blood test. Sexually transmitted disease (STD) testing. Diabetes screening. This is done by checking your blood sugar (glucose) after you have not eaten for a while (fasting). You may have this done every 1-3 years. Bone density scan. This is done to screen for osteoporosis. You may have this done starting at age 52. Mammogram. This may be done every 1-2 years. Talk to your health care provider about how often you should have regular mammograms. Talk with your health care provider about your test results, treatment options, and if necessary, the need for more tests. Vaccines  Your health care provider may recommend certain vaccines, such as: Influenza vaccine. This is recommended every year. Tetanus, diphtheria, and acellular pertussis (Tdap, Td) vaccine. You may need a Td booster every 10 years. Zoster vaccine. You may need this after age 38. Pneumococcal 13-valent conjugate (PCV13) vaccine. One dose is recommended after age 5. Pneumococcal polysaccharide (PPSV23) vaccine. One dose is recommended after age 53. Talk to your health care provider about which screenings and vaccines you need and how often you need them. This information is not intended to replace advice given to you by your health care provider. Make sure you discuss any questions you have with  your health care provider. Document Released: 06/09/2015 Document Revised: 01/31/2016 Document Reviewed: 03/14/2015 Elsevier Interactive Patient Education  2017 [employer].  Fall Prevention in the Home Falls can cause injuries. They can happen to people of all ages. There are many things you can do to make your home safe and to help prevent falls. What can I do on the outside of my home? Regularly fix the edges of walkways and driveways and fix any cracks. Remove anything that might make you trip as you walk through a door, such as a raised step or threshold. Trim any bushes or trees on the path to your home. Use  bright outdoor lighting. Clear any walking paths of anything that might make someone trip, such as rocks or tools. Regularly check to see if handrails are loose or broken. Make sure that both sides of any steps have handrails. Any raised decks and porches should have guardrails on the edges. Have any leaves, snow, or ice cleared regularly. Use sand or salt on walking paths during winter. Clean up any spills in your garage right away. This includes oil or grease spills. What can I do in the bathroom? Use night lights. Install grab bars by the toilet and in the tub and shower. Do not use towel bars as grab bars. Use non-skid mats or decals in the tub or shower. If you need to sit down in the shower, use a plastic, non-slip stool. Keep the floor dry. Clean up any water that spills on the floor as soon as it happens. Remove soap buildup in the tub or shower regularly. Attach bath mats securely with double-sided non-slip rug tape. Do not have throw rugs and other things on the floor that can make you trip. What can I do in the bedroom? Use night lights. Make sure that you have a light by your bed that is easy to reach. Do not use any sheets or blankets that are too big for your bed. They should not hang down onto the floor. Have a firm chair that has side arms. You can use this for support while you get dressed. Do not have throw rugs and other things on the floor that can make you trip. What can I do in the kitchen? Clean up any spills right away. Avoid walking on wet floors. Keep items that you use a lot in easy-to-reach places. If you need to reach something above you, use a strong step stool that has a grab bar. Keep electrical cords out of the way. Do not use floor polish or wax that makes floors slippery. If you must use wax, use non-skid floor wax. Do not have throw rugs and other things on the floor that can make you trip. What can I do with my stairs? Do not leave any items on the  stairs. Make sure that there are handrails on both sides of the stairs and use them. Fix handrails that are broken or loose. Make sure that handrails are as long as the stairways. Check any carpeting to make sure that it is firmly attached to the stairs. Fix any carpet that is loose or worn. Avoid having throw rugs at the top or bottom of the stairs. If you do have throw rugs, attach them to the floor with carpet tape. Make sure that you have a light switch at the top of the stairs and the bottom of the stairs. If you do not have them, ask someone to add them for you. What else  can I do to help prevent falls? Wear shoes that: Do not have high heels. Have rubber bottoms. Are comfortable and fit you well. Are closed at the toe. Do not wear sandals. If you use a stepladder: Make sure that it is fully opened. Do not climb a closed stepladder. Make sure that both sides of the stepladder are locked into place. Ask someone to hold it for you, if possible. Clearly mark and make sure that you can see: Any grab bars or handrails. First and last steps. Where the edge of each step is. Use tools that help you move around (mobility aids) if they are needed. These include: Canes. Walkers. Scooters. Crutches. Turn on the lights when you go into a dark area. Replace any light bulbs as soon as they burn out. Set up your furniture so you have a clear path. Avoid moving your furniture around. If any of your floors are uneven, fix them. If there are any pets around you, be aware of where they are. Review your medicines with your doctor. Some medicines can make you feel dizzy. This can increase your chance of falling. Ask your doctor what other things that you can do to help prevent falls. This information is not intended to replace advice given to you by your health care provider. Make sure you discuss any questions you have with your health care provider. Document Released: 03/09/2009 Document Revised:  10/19/2015 Document Reviewed: 06/17/2014 Elsevier Interactive Patient Education  2017 [employer].

## 2022-09-12 NOTE — Progress Notes (Signed)
Acute Office Visit  Subjective:     Patient ID: [name], female    DOB: January 15, 1946, 77 y.o.   MRN: [id number]  CC: bilateral leg pain  Patient is in today for bilateral leg pain. She reports about a week and a half ago she noticed her right knee and surrounding area was sore (burning feeling, tender to touch) and mildly swollen. A few days later her left knee started feeling the same way. She tried topical analgesics, ice,  and ibuprofen with temporary improvement. 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 Last knee xray was left side in August 2023 with arthritis. No recent falls, but she has fallen multiple times in the past, states her right leg often "gives out" on her. She reports mentioned her pain to her cousin and they got her worried about the potential for a blood clot, so she would like [place] to rule out. States she is not able to do PT currently due to her spine issues. No bruising, erythema, or loss of function compared to baseline.    All review of systems negative except what is listed in the HPI      Objective:    BP (!) 128/56 (BP Location: Left Arm, Cuff Size: Large)   Pulse 87   Temp 98.3 F (36.8 C) (Oral)   Resp 12   Ht 5\' 5"  (1.651 m)   Wt 182 lb 12.8 oz (82.9 kg)   SpO2 98%   BMI 30.42 kg/m    Physical Exam Vitals reviewed.  Constitutional:      Appearance: Normal appearance.  Musculoskeletal:     Comments: Mild tenderness to palpation of bilateral anterior knees, some mild swelling to left anterior knee, no erythema, warmth, baseline ROM  Skin:    General: Skin is warm and dry.     Findings: No bruising, erythema or rash.  Neurological:     Mental Status: She is alert and oriented to person, place, and time.  Psychiatric:        Mood and Affect: Mood normal.        Behavior: Behavior normal.        Thought Content: Thought content normal.         Judgment: Judgment normal.     No results found for any visits on 09/12/22.      Assessment & Plan:   Problem List Items Addressed This Visit   None Visit Diagnoses     Pain in both lower legs    -  Primary   Relevant Medications   meloxicam (MOBIC) 15 MG tablet   Other Relevant Orders   D-Dimer, Quantitative     Patient had a relative mention blood clots and it made her nervous - will get d-dimer today and proceed as indicated.  Pain is primarily in her knees, mostly anterior.  Starting meloxicam for inflammation and arthritis history (do not combine with other antiinflammatories like ibuprofen or Aleve) If not improving after 2-3 week trial or symptoms worsen before then, please let us know so we can further evaluate with imaging, etc.  She is scheduled to see spine specialist in a few weeks.  Patient aware of signs/symptoms requiring further/urgent evaluation.      Meds ordered this encounter  Medications   meloxicam (MOBIC) 15 MG tablet    Sig: Take 1 tablet (15 mg total) by mouth daily.  Dispense:  30 tablet    Refill:  0    Order Specific Question:   Supervising Provider    Answer:   Danise Edge A [[id number]]    Return if symptoms worsen or fail to improve.  Clayborne Dana, NP

## 2022-09-13 ENCOUNTER — Ambulatory Visit (HOSPITAL_BASED_OUTPATIENT_CLINIC_OR_DEPARTMENT_OTHER)
Admission: RE | Admit: 2022-09-13 | Discharge: 2022-09-13 | Disposition: A | Discharge status: Home / Self Care | Payer: Medicare HMO | Source: Ambulatory Visit | Attending: Family Medicine | Admitting: Family Medicine

## 2022-09-13 DIAGNOSIS — M79662 Pain in left lower leg: Secondary | ICD-10-CM | POA: Diagnosis not present

## 2022-09-13 DIAGNOSIS — M79661 Pain in right lower leg: Secondary | ICD-10-CM | POA: Diagnosis not present

## 2022-09-13 NOTE — Addendum Note (Signed)
Addended by: Hyman Hopes B on: 09/13/2022 08:08 AM   Modules accepted: Orders

## 2022-09-18 ENCOUNTER — Ambulatory Visit: Payer: Medicare HMO | Admitting: Physician Assistant

## 2022-09-18 ENCOUNTER — Encounter: Payer: Self-pay | Admitting: Physician Assistant

## 2022-09-18 VITALS — BP 132/70 | HR 84 | Ht 65.5 in | Wt 185.4 lb

## 2022-09-18 DIAGNOSIS — K219 Gastro-esophageal reflux disease without esophagitis: Secondary | ICD-10-CM | POA: Diagnosis not present

## 2022-09-18 DIAGNOSIS — R1013 Epigastric pain: Secondary | ICD-10-CM

## 2022-09-18 DIAGNOSIS — K209 Esophagitis, unspecified without bleeding: Secondary | ICD-10-CM | POA: Diagnosis not present

## 2022-09-18 MED ORDER — NYSTATIN 100000 UNIT/ML MT SUSP: 5.0000 mL | Freq: Four times a day (QID) | OROMUCOSAL | 0 refills | Status: DC

## 2022-09-18 MED ORDER — PANTOPRAZOLE SODIUM 40 MG PO TBEC: 40.0000 mg | DELAYED_RELEASE_TABLET | Freq: Two times a day (BID) | ORAL | 2 refills | Status: DC

## 2022-09-18 MED ORDER — SUCRALFATE 1 G PO TABS: 1.0000 g | ORAL_TABLET | Freq: Three times a day (TID) | ORAL | 3 refills | Status: DC

## 2022-09-18 NOTE — Patient Instructions (Addendum)
_______________________________________________________  If your blood pressure at your visit was 140/90 or greater, please contact your primary care physician to follow up on this. _______________________________________________________  If you are age 77 or older, your body mass index should be between 23-30. Your Body mass index is 30.38 kg/m. If this is out of the aforementioned range listed, please consider follow up with your Primary Care Provider. ________________________________________________________  The [facility] providers would like to encourage you to use [facility] to communicate with providers for non-urgent requests or questions.  Due to long hold times on the telephone, sending your provider a message by [facility] may be a faster and more efficient way to get a response.  Please allow 48 business hours for a response.  Please remember that this is for non-urgent requests.  _______________________________________________________  We have sent the following medications to your pharmacy for you to pick up at your convenience:  INCREASE: pantoprazole  to one tablet twice daily  START: Carafate  tablet make into a slurry by crushing 1 tablet and adding into 10 cc of water three times daily.  START: Mycostatin oral suspension 5cc swish and goggle 4 times per day for 2 weeks.  Call back in 10 days and speak to [facility] with updates on symptoms.  Thank you for entrusting me with your care and choosing [facility].  Amy Esterwood, PA-C

## 2022-09-18 NOTE — Progress Notes (Signed)
Subjective:    Patient ID: [name], female    DOB: 1945-12-28, 77 y.o.   MRN: [id number]  HPI [name] is a pleasant 77 year old white female, established with Dr. Leone Payor.  She was last seen in the office in June 2023 when she was having issues with GERD symptoms and regurgitation.  She underwent EGD in June 2023 with finding of grade a esophagitis and possible slight loosening of her Nissen wrap, she was dilated to 52 [place].  She also had barium swallow at that time which showed moderate to severe dysmotility with poor progression of the food bolus and prograde flow of barium at the level of the aortic notch.  Commented small hiatal hernia and intact Nissen, focal area of narrowing in the upper esophagus but barium tablet passed without difficulty. She comes in today with complaints of upper abdominal discomfort and pressure, lack of appetite and sensation of early satiety.  She has been taking Protonix 40 mg once daily. She says her symptoms started after she was ill with a flulike illness a couple of months ago which manifested with nausea and diarrhea.  She started having dark stools around that time and says that the dark stools lasted for a couple of weeks.  Stools have since returned to normal color.  She had only taken 1 dose of Pepto-Bismol around that time, had not noted any overt hematochezia.  Also with her vomiting she had not vomited anything dark or black in color. At this time stool is more formed but she has noticed some mucus.  She has had an increase in heartburn, belching and burping and feels irritated and uncomfortable in the lower chest and epigastrium.  She is only eating about 2 meals per day and again says she feels up very quickly. She had been to her PCP/ primary care, and had Hemoccults done on 08/26/2022.  Stool was positive for occult blood. WBC 7.2/hemoglobin 14.4/hematocrit 42.7/MCV 89 BUN 13/creatinine 0.78 and LFTs within normal limits.  Last colonoscopy  August 2022 with finding of multiple diverticuli and she had a 2 small polyps removed, one was a diminutive adenoma and one was a diminutive sessile serrated polyp, indicated for 5-year interval follow-up.  Other medical problems include hypertension, osteoarthritis, fibromyalgia, history of prior melanoma and history of breast cancer stage IIa, she is status postcholecystectomy has had prior Nissen fundoplication as outlined above.  Review of Systems Pertinent positive and negative review of systems were noted in the above HPI section.  All other review of systems was otherwise negative.   Outpatient Encounter Medications as of 09/18/2022  Medication Sig   amLODipine (NORVASC) 5 MG tablet Take 1 tablet (5 mg total) by mouth daily.   aspirin EC 81 MG tablet Take 81 mg by mouth daily. Swallow whole.   cholecalciferol (VITAMIN D3) 25 MCG (1000 UNIT) tablet Take 1,000 Units by mouth daily. Patient takes 2000 units   citalopram (CELEXA) 20 MG tablet TAKE ONE TABLET BY MOUTH EVERY MORNING   Cyanocobalamin (VITAMIN B-12) 5000 MCG TBDP Take by mouth.   Evolocumab (REPATHA SURECLICK) 140 MG/ML SOAJ Inject 140 mg into the skin every 14 (fourteen) days.   furosemide (LASIX) 40 MG tablet TAKE ONE TABLET BY MOUTH EVERY MORNING   isosorbide mononitrate (IMDUR) 30 MG 24 hr tablet TAKE ONE TABLET BY MOUTH EVERY EVENING   meloxicam (MOBIC) 15 MG tablet Take 1 tablet (15 mg total) by mouth daily.   nystatin (MYCOSTATIN) 100000 UNIT/ML suspension Take 5 mLs (500,000  Units total) by mouth 4 (four) times daily. Swish and goggle 4 times daily for 2 weeks.   sucralfate (CARAFATE) 1 g tablet Take 1 tablet (1 g total) by mouth 3 (three) times daily.   [DISCONTINUED] pantoprazole (PROTONIX) 40 MG tablet Take 1 tablet (40 mg total) by mouth daily.   pantoprazole (PROTONIX) 40 MG tablet Take 1 tablet (40 mg total) by mouth 2 (two) times daily before a meal.   No facility-administered encounter medications on file as of  09/18/2022.   Allergies  Allergen Reactions   Other Anaphylaxis and Swelling    Glue (eye lash glue, gorilla glue)   Silodosin Rash    Facial rash   Ace Inhibitors Other (See Comments)    REACTION: angioedema   Buprenorphine Hcl Other (See Comments)    "crazy"   Morphine And Related Other (See Comments)    "crazy" "crazy"   Codeine Rash   Sulfonamide Derivatives Rash   Telmisartan Rash   Patient Active Problem List   Diagnosis Date Noted   Arthritis of left knee 12/26/2021   Allergy 10/17/2021   Lichen sclerosus 12/06/2020   Dizziness 10/27/2020   Vitamin B12 deficiency    Mild anxiety    Kidney stone    History of hiatal hernia    History of chicken pox    GERD (gastroesophageal reflux disease)    Gastric polyps    Dyspnea    Depression    Complication of anesthesia    Chronic back pain    Blood dyscrasia    Arthritis    Ankle effusion, left 03/23/2020   Rheumatoid arthritis 11/02/2018   Episode of recurrent major depressive disorder 12/05/2017   Primary osteoarthritis of both hands 07/09/2017   Primary osteoarthritis of both knees 07/09/2017   DDD (degenerative disc disease), cervical 07/09/2017   DDD (degenerative disc disease), lumbar 07/09/2017   Peripheral edema 11/26/2016   Medicare annual wellness visit, subsequent 06/30/2016   Hx of adenomatous polyp of colon 07/03/2015   Viral URI with cough 09/11/2014   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   Breast cancer, left breast 07/28/2014   Family history of breast cancer    Family history of colon cancer    Family history of pancreatic cancer    Malignant neoplasm of upper-outer quadrant of left breast in female, 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   Epigastric pain 02/02/2014   Peripheral neuropathy 01/01/2013   Melanoma of upper arm 02/10/2012   S/P laparoscopic fundoplication 08/09/2011   Steatosis of liver 12/07/2009   COAGULOPATHY 07/12/2008   Melanoma 2010   CHEST XRAY, ABNORMAL  01/04/2008   Chronic cough 12/07/2007   Vitamin D deficiency 04/14/2007   VITAMIN B12 DEFICIENCY 02/27/2007   Essential hypertension 02/27/2007   GERD 02/27/2007   Fibromyalgia 02/27/2007   Breast cancer, right breast 1995   Social History   Socioeconomic History   Marital status: Married    Spouse name: Not on file   Number of children: 2   Years of education: Not on file   Highest education level: Not on file  Occupational History   Occupation: Production designer, theatre/television/film  Tobacco Use   Smoking status: Never   Smokeless tobacco: Never   Tobacco comments:    Regular Exercise - Yes  Vaping Use   Vaping Use: Never used  Substance and Sexual Activity   Alcohol use: No   Drug use: No   Sexual activity: Yes  Other Topics Concern   Not on file  Social History  Narrative   She lives with husband.     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         Right Handed    Lives in a one story home with a basement    Social Determinants of Health   Financial Resource Strain: Low Risk  (09/12/2022)   Overall Financial Resource Strain (CARDIA)    Difficulty of Paying Living Expenses: Not hard at all  Food Insecurity: No Food Insecurity (09/12/2022)   Hunger Vital Sign    Worried About Running Out of Food in the Last Year: Never true    Ran Out of Food in the Last Year: Never true  Transportation Needs: No Transportation Needs (09/12/2022)   PRAPARE - Administrator, Civil Service (Medical): No    Lack of Transportation (Non-Medical): No  Physical Activity: Inactive (09/12/2022)   Exercise Vital Sign    Days of Exercise per Week: 0 days    Minutes of Exercise per Session: 0 min  Stress: No Stress Concern Present (09/12/2022)   [employer] of Occupational Health - Occupational Stress Questionnaire    Feeling of Stress : Only a little  Social Connections: Socially Integrated (09/12/2022)   Social Connection and Isolation Panel  [NHANES]    Frequency of Communication with Friends and Family: More than three times a week    Frequency of Social Gatherings with Friends and Family: More than three times a week    Attends Religious Services: More than 4 times per year    Active Member of [employer] or Organizations: Yes    Attends Banker Meetings: More than 4 times per year    Marital Status: Married  Catering manager Violence: Not At Risk (09/12/2022)   Humiliation, Afraid, Rape, and Kick questionnaire    Fear of Current or Ex-Partner: No    Emotionally Abused: No    Physically Abused: No    Sexually Abused: No    Ms. Ipock's family history includes Anemia in her paternal grandfather; Breast cancer (age of onset: 46) in her cousin; Breast cancer (age of onset: 31) in her cousin; CAD in her father; Cancer in her paternal uncle; Colon cancer (age of onset: 18) in her cousin; Colon cancer (age of onset: 29) in her cousin; Colon polyps in her brother and father; Dementia in her father; Pancreatic cancer (age of onset: 47) in her brother; Stroke in her mother and another family member.      Objective:    Vitals:   09/18/22 1333  BP: 132/70  Pulse: 84    Physical Exam Well-developed well-nourished  eld WF in no acute distress.  Height, Weight,185  BMI 30.38  HEENT; nontraumatic normocephalic, EOMI, PE R LA, sclera anicteric. Oropharynx;slight whitish exudate on tongue Neck; supple, no JVD Cardiovascular; regular rate and rhythm with S1-S2, no murmur rub or gallop Pulmonary; Clear bilaterally Abdomen; soft, nondistended, there is mild tenderness in the epigastriu/ subxyphoid,no palpable mass or hepatosplenomegaly, bowel sounds are active s/p tram flaps Rectal; Skin; benign exam, no jaundice rash or appreciable lesions Extremities; no clubbing cyanosis or edema skin warm and dry Neuro/Psych; alert and oriented x4, grossly nonfocal mood and affect appropriate        Assessment & Plan:   #40 77 year old  with history of chronic GERD, prior Nissen fundoplication, and esophageal dysmotility documented on barium swallow June 2023. She comes in now with 53-month history of ongoing GI symptoms,  onset after she had a flulike illness with vomiting and diarrhea about 2 months ago. Ever since then she has had lack of appetite, altered taste sensation, bloating and upper abdominal discomfort and early satiety.  No dysphagia or odynophagia She had noted dark stools which started around the time of the acute viral illness and lasted for about 2 weeks, stools have returned to normal but she did a Hemoccult which was positive on 08/26/2022.  No anemia/hemoglobin 14.4  Etiology of her current symptoms is not entirely clear though I suspect she has had an acute esophagitis after the viral illness with vomiting, and this may also be responsible for the heme positive stool.  She may have some postinfectious gastroparesis type symptoms, and gastritis.  #2 history of esophageal stricture status post dilation June 2023. #3 history of adenomatous and sessile serrated polyps-up-to-date with colonoscopy last done August 2022 and indicated for 5-year interval follow-up #4 diverticulosis 5.  History of breast cancer 2 May 6.  History of melanoma 7.  Status post cholecystectomy 8.  Hypertension 9.  Fibromyalgia  Plan patient was given a copy of a gastroparesis diet, encouraged stage III with smaller low roughage meals #2 increase Protonix to 40 mg p.o. twice daily AC Will give her a course of Mycostatin oral suspension 5 cc swish and spit 4 times daily x 2 weeks for possible oral candidiasis accounting for her altered taste Start Carafate 1 g 3 times daily between meals, crush tablet and make into a slurry with 5 to 10 cc of water Of asked her to try the above regimen over the next 10 to 14 days and to call back and speak to my nurse with an update at that time if she has not had any significant improvement in symptoms and  she may need further evaluation with cross-sectional imaging and/or consideration of EGD with Dr. Leone Payor.   [name] Oswald Hillock PA-C 09/18/2022   Cc: Olive Bass,*

## 2022-09-20 ENCOUNTER — Telehealth: Payer: Self-pay | Admitting: Physician Assistant

## 2022-09-20 NOTE — Telephone Encounter (Signed)
Patients pharmacy called regarding the medication Nystatin for the patient. She stated it was written for a 3 day supply but the instructions say it is suppose to be taken for 2 weeks. Call back number is [phone]. Please advise, thank you.

## 2022-09-20 NOTE — Telephone Encounter (Signed)
Spoke with Santina Evans at pharmacy and gave update that patient should be given a 2 week suplly of Nystatin.  Santina Evans agreed to plan and verbalized understanding.  No further questions.

## 2022-09-27 ENCOUNTER — Telehealth: Payer: Self-pay | Admitting: Physician Assistant

## 2022-09-27 NOTE — Telephone Encounter (Signed)
PT is calling about her med changes. She still feels bad and wants to know should she schedule the xray. Please Advise

## 2022-09-27 NOTE — Telephone Encounter (Signed)
Called the patient and discussed. She says she has had the cough and it was part of what made her PCP decide to send her to GI. She agrees to give the medication a few more days.

## 2022-09-27 NOTE — Telephone Encounter (Signed)
Patient of yours seen by Mike Gip, PA 09/18/22 for abdominal pain and possible candidiasis. She reports "I am the same.My symptoms have not improved." She tells me she has not slept in 2 days because of her coughing. She coughs sporadically all day and night. She has eaten 2 cookies today and that set off coughing until she "spit up white mucous." No appetite and she is not taking in much nutrition. Complains of a weird taste in her mouth. She coughs with any cold beverage. She tolerates warm fluids better. Explained she needs to push fluids as she is also taking Lasix. She has been on treatment for about 8 days now. She asks if anything needs to be done differently.

## 2022-09-27 NOTE — Telephone Encounter (Signed)
In [name]'s note I do not see cough described  Is this new or did we not realize that was an issue  Either way the sxs she is describing to [place] today sound pulmonary in origin and she should seek help from PCP  Let me know if there is anything else

## 2022-09-30 ENCOUNTER — Ambulatory Visit (HOSPITAL_BASED_OUTPATIENT_CLINIC_OR_DEPARTMENT_OTHER)
Admission: RE | Admit: 2022-09-30 | Discharge: 2022-09-30 | Disposition: A | Discharge status: Home / Self Care | Payer: Medicare HMO | Source: Ambulatory Visit | Attending: Internal Medicine | Admitting: Internal Medicine

## 2022-09-30 ENCOUNTER — Other Ambulatory Visit: Payer: Self-pay

## 2022-09-30 ENCOUNTER — Ambulatory Visit (HOSPITAL_BASED_OUTPATIENT_CLINIC_OR_DEPARTMENT_OTHER)
Admission: RE | Admit: 2022-09-30 | Discharge: 2022-09-30 | Disposition: A | Discharge status: Home / Self Care | Payer: Medicare HMO | Source: Ambulatory Visit | Attending: Family | Admitting: Family

## 2022-09-30 DIAGNOSIS — R059 Cough, unspecified: Secondary | ICD-10-CM

## 2022-09-30 DIAGNOSIS — M85851 Other specified disorders of bone density and structure, right thigh: Secondary | ICD-10-CM | POA: Diagnosis not present

## 2022-09-30 DIAGNOSIS — Z780 Asymptomatic menopausal state: Secondary | ICD-10-CM | POA: Diagnosis not present

## 2022-09-30 NOTE — Telephone Encounter (Signed)
Patient reports today that she may be minimally better because she can now swallow pudding, jello and liquids. If she eats solids, she will have to cough and clear her throat constantly when she speaks. Patient does have a hoarse sounding voice and clears her throat several times during this telephone call. She reports cough and "spitting up this whitish phlegm. The patient wants to do the imaging that Amy had discussed.  Does this need to wait for Amy?

## 2022-09-30 NOTE — Telephone Encounter (Signed)
Called patient. No answer.  Patient did get the chest xray.

## 2022-09-30 NOTE — Telephone Encounter (Signed)
Called [facility] in [place]. No answer. Left a message. Called the patient. No answer. Left a message.

## 2022-09-30 NOTE — Telephone Encounter (Signed)
It sounds like she will need an EGD  I would like her to do a DG chest (CXR) 2 views  She is having DEXA at [facility] today ([place]) so could we have her get a CXR there today dx: cough

## 2022-10-01 ENCOUNTER — Telehealth (HOSPITAL_BASED_OUTPATIENT_CLINIC_OR_DEPARTMENT_OTHER): Payer: Self-pay

## 2022-10-02 ENCOUNTER — Telehealth: Payer: Self-pay | Admitting: Physician Assistant

## 2022-10-02 NOTE — Telephone Encounter (Signed)
Spoke with the patient. No results have been posted on her CXR. She is aware. Reports she "may be a tiny bit better. I am not eating anything solid, only things as thick as pudding." She feels "a little better since I am eating a little more. And I am not coughing as much."

## 2022-10-02 NOTE — Telephone Encounter (Signed)
Patient called regarding recent chest x ray results. Requesting a call back to go over results. Please advise, thank you.

## 2022-10-03 DIAGNOSIS — M5416 Radiculopathy, lumbar region: Secondary | ICD-10-CM | POA: Diagnosis not present

## 2022-10-04 DIAGNOSIS — L821 Other seborrheic keratosis: Secondary | ICD-10-CM | POA: Diagnosis not present

## 2022-10-04 DIAGNOSIS — D1801 Hemangioma of skin and subcutaneous tissue: Secondary | ICD-10-CM | POA: Diagnosis not present

## 2022-10-04 DIAGNOSIS — Z85820 Personal history of malignant melanoma of skin: Secondary | ICD-10-CM | POA: Diagnosis not present

## 2022-10-04 DIAGNOSIS — L570 Actinic keratosis: Secondary | ICD-10-CM | POA: Diagnosis not present

## 2022-10-04 DIAGNOSIS — D225 Melanocytic nevi of trunk: Secondary | ICD-10-CM | POA: Diagnosis not present

## 2022-10-04 DIAGNOSIS — D692 Other nonthrombocytopenic purpura: Secondary | ICD-10-CM | POA: Diagnosis not present

## 2022-10-04 DIAGNOSIS — D2271 Melanocytic nevi of right lower limb, including hip: Secondary | ICD-10-CM | POA: Diagnosis not present

## 2022-10-04 DIAGNOSIS — L814 Other melanin hyperpigmentation: Secondary | ICD-10-CM | POA: Diagnosis not present

## 2022-10-07 ENCOUNTER — Ambulatory Visit: Payer: Medicare HMO | Admitting: Physician Assistant

## 2022-10-07 NOTE — Telephone Encounter (Signed)
CXR is ok - no pneumonia  No cause of chronic cough seen  Will get Amy to weigh in but looks like she has had cough off/on x years  I scoped her last year and do not thing repeating an EGD will help  Also had ba swallow last year - dysmotility and slight hiatal hernia  If her main c/o remains cough I think she needs to see PCP and/or  pulmonary

## 2022-10-07 NOTE — Telephone Encounter (Signed)
Called the patient to discuss. No answer. Left her a voicemail of my call. Left brief information of normal CXR, continue her medication, and try to advance her diet gradually. Call with questions.

## 2022-10-14 ENCOUNTER — Telehealth: Payer: Self-pay | Admitting: Physician Assistant

## 2022-10-14 NOTE — Telephone Encounter (Signed)
Returned call to patient. Pt thinks that she may be allergic to Carafate. Pt reports that about a week after starting the medication she started itching all over. The itching has become worse in the last week or so. Patient reports itching in her scalp and her body. No visible rashes. Patient states that her eyes were swollen yesterday, she took Benadryl. Patient states that her eyes were swollen and had matter around them this morning. Patient denies any new medications, no new soaps, detergents, lotions, or shampoos. Pt also reports that the coughing returned about 10 days ago, pt only experiences this with firm foods. Pt states that she has continued to take her Protonix 40 mg BID. Patient only took Carafate yesterday morning. Please advise on plan, thanks.

## 2022-10-14 NOTE — Telephone Encounter (Signed)
Received phone call from patient.  She has been taking sucralfate and has been itching all over and continues to have chest pains from reflux.  She wants to know if this is a form of a sulkfa drug as she is allergic.  Please call and discuss.  Thank you.

## 2022-10-15 NOTE — Telephone Encounter (Signed)
Called and spoke with patient regarding Amy's recommendation. Dr. Leone Payor had a cancellation for an appt tomorrow at 11:10 am. Pt took this appt. Pt had no concerns at the end of the call.

## 2022-10-16 ENCOUNTER — Encounter: Payer: Self-pay | Admitting: Internal Medicine

## 2022-10-16 ENCOUNTER — Ambulatory Visit: Payer: Medicare HMO | Admitting: Internal Medicine

## 2022-10-16 VITALS — BP 132/74 | HR 76 | Ht 65.0 in | Wt 182.0 lb

## 2022-10-16 DIAGNOSIS — R1013 Epigastric pain: Secondary | ICD-10-CM

## 2022-10-16 DIAGNOSIS — Z9889 Other specified postprocedural states: Secondary | ICD-10-CM | POA: Diagnosis not present

## 2022-10-16 DIAGNOSIS — R1319 Other dysphagia: Secondary | ICD-10-CM

## 2022-10-16 DIAGNOSIS — R059 Cough, unspecified: Secondary | ICD-10-CM | POA: Diagnosis not present

## 2022-10-16 NOTE — Progress Notes (Signed)
[name] 77 y.o. Mar 04, 1946 [id number]  Assessment & Plan:   Encounter Diagnoses  Name Primary?   Esophageal dysphagia Yes   Epigastric pain    History of Nissen fundoplication    Cough, unspecified type     Evaluate with EGD, consider repeat dilation.  We have scheduled this for tomorrow.  Question if recent illness has led to some sort of increase in esophagitis could be infectious versus related to nausea and vomiting she had earlier this year.  The risks and benefits as well as alternatives of endoscopic procedure(s) have been discussed and reviewed. All questions answered. The patient agrees to proceed.    Subjective:   Chief Complaint: Dysphagia epigastric pain  HPI 77 year old white woman with history of chronic GERD and esophageal dysmotility status post Nissen fundoplication who had seen Amy Esterwood PA-C in April, with 2 months of altered taste sensation bloating upper abdominal discomfort early satiety.  She had been sick with nausea vomiting and diarrhea about 2 months prior.  She had a heme positive stool without overt bleeding.  She had had a colonoscopy in August 2022.  Medical management and diet changes were recommended, gastroparesis diet, Mycostatin oral suspension swish and swallow, Carafate and increasing Protonix to 40 mg twice daily from daily.  She continues to feel unwell with dysphagia and epigastric pain and these altered taste sensations.  She has got coughing when she eats at times.  Pretty much every time she eats liquids or solids.  It sounds like there is dysphagia again also.  She underwent EGD in June 2023 with finding of grade a esophagitis and possible slight loosening of her Nissen wrap, she was dilated to 91 [place]. She also had barium swallow at that time which showed moderate to severe dysmotility with poor progression of the food bolus and prograde flow of barium at the level of the aortic notch. Commented small hiatal hernia and intact  Nissen, focal area of narrowing in the upper esophagus but barium tablet passed without difficulty.   Wt Readings from Last 3 Encounters:  10/16/22 182 lb (82.6 kg)  09/18/22 185 lb 6 oz (84.1 kg)  09/12/22 182 lb 12.8 oz (82.9 kg)    Allergies  Allergen Reactions   Other Anaphylaxis and Swelling    Glue (eye lash glue, gorilla glue)   Silodosin Rash    Facial rash   Ace Inhibitors Other (See Comments)    REACTION: angioedema   Buprenorphine Hcl Other (See Comments)    "crazy"   Morphine And Codeine Other (See Comments)    "crazy" "crazy"   Codeine Rash   Sulfonamide Derivatives Rash   Telmisartan Rash   Current Meds  Medication Sig   amLODipine (NORVASC) 5 MG tablet Take 1 tablet (5 mg total) by mouth daily.   aspirin EC 81 MG tablet Take 81 mg by mouth daily. Swallow whole.   cholecalciferol (VITAMIN D3) 25 MCG (1000 UNIT) tablet Take 1,000 Units by mouth daily. Patient takes 2000 units   citalopram (CELEXA) 20 MG tablet TAKE ONE TABLET BY MOUTH EVERY MORNING   Cyanocobalamin (VITAMIN B-12) 5000 MCG TBDP Take by mouth.   Evolocumab (REPATHA SURECLICK) 140 MG/ML SOAJ Inject 140 mg into the skin every 14 (fourteen) days.   furosemide (LASIX) 40 MG tablet TAKE ONE TABLET BY MOUTH EVERY MORNING   isosorbide mononitrate (IMDUR) 30 MG 24 hr tablet TAKE ONE TABLET BY MOUTH EVERY EVENING   meloxicam (MOBIC) 15 MG tablet Take 1 tablet (15  mg total) by mouth daily.   nystatin (MYCOSTATIN) 100000 UNIT/ML suspension Take 5 mLs (500,000 Units total) by mouth 4 (four) times daily. Swish and goggle 4 times daily for 2 weeks.   pantoprazole (PROTONIX) 40 MG tablet Take 1 tablet (40 mg total) by mouth 2 (two) times daily before a meal.   Past Medical History:  Diagnosis Date   Allergy    Ankle effusion, left 03/23/2020   Arthritis    "spine" (07/28/2014)   Blood dyscrasia    bruises and bleed easily   Breast cancer, left breast (HCC) 07/28/2014   Breast cancer, right breast (HCC) 1995    CHEST XRAY, ABNORMAL 01/04/2008   Qualifier: Diagnosis of  By: Plotnikov MD, Georgina Quint    Chronic back pain    "all over"   Chronic cough 12/07/2007   Could be related to GERD - relapsed    COAGULOPATHY 07/12/2008   Qualifier: Diagnosis of  By: Koleen Distance CMA (AAMA), Leisha     Complication of anesthesia    went to sleep easily but hard to wake up up until elbow OR in 2010   DDD (degenerative disc disease), cervical 07/09/2017   DDD (degenerative disc disease), lumbar 07/09/2017   With spinal stenosis.   Dr. Dutch Quint   Depression    Dizziness 10/27/2020   Dyspnea    Normal Spirometry 03/2008 EF 65% BNP normal 11/2007   Episode of recurrent major depressive disorder (HCC) 12/05/2017   Essential hypertension 02/27/2007   Mild Off meds 2013  BP Readings from Last 3 Encounters:  02/10/12 128/90  08/09/11 134/62  03/06/11 150/86      Family history of breast cancer    Family history of colon cancer    Family history of pancreatic cancer    Fibromyalgia 02/27/2007   Chronic    Gastric polyps    GERD 02/27/2007   S/p Nissen's 2010    GERD (gastroesophageal reflux disease)    History of chicken pox    History of hiatal hernia    Hx of adenomatous polyp of colon 07/03/2015   Kidney stone    right kidney    Lichen sclerosus    Malignant neoplasm of upper-outer quadrant of left breast in female, 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   Medicare annual wellness visit, subsequent 06/30/2016   Melanoma (HCC) 2010   "right elbow; [facility]"   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 ([facility])   Mild anxiety    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   Peripheral edema 11/26/2016   Peripheral neuropathy 01/01/2013   Primary osteoarthritis of both hands 07/09/2017   Primary osteoarthritis of both knees 07/09/2017   Chondromalacia patella   Rheumatoid arthritis (HCC) 11/02/2018   S/P laparoscopic fundoplication 08/09/2011   Steatosis of liver 12/07/2009    Qualifier: Diagnosis of  By: Surface RN, Donna     Viral URI with cough 09/11/2014   Vitamin B12 deficiency    VITAMIN B12 DEFICIENCY 02/27/2007   Chronic    Vitamin D deficiency    Past Surgical History:  Procedure Laterality Date   BREAST BIOPSY Left 04/2014   BREAST RECONSTRUCTION WITH PLACEMENT OF TISSUE EXPANDER AND FLEX HD (ACELLULAR HYDRATED DERMIS) Left 07/28/2014   Procedure: LEFT BREAST RECONSTRUCTION PLACEMENT OF LEFT TISSUE EXPANDER ;  Surgeon: Etter Sjogren, MD;  Location: [facility] OR;  Service: Plastics;  Laterality: Left;   BREAST RECONSTRUCTION WITH PLACEMENT OF TISSUE EXPANDER AND FLEX HD (ACELLULAR HYDRATED DERMIS)  Left 09/08/2014   Procedure: REMOVAL OF TISSUE EXPANDER FROM LEFT BREAST;  Surgeon: Etter Sjogren, MD;  Location[facility] OR;  Service: Plastics;  Laterality: Left;   BUNIONECTOMY Bilateral 1970's   COLONOSCOPY     Dr Jarold Motto   CYSTOSCOPY WITH RETROGRADE PYELOGRAM, URETEROSCOPY AND STENT PLACEMENT Right 06/09/2015   Procedure: CYSTOSCOPY WITH RIGHT RETROGRADE PYELOGRAM, RIGHT URETEROSCOPY AND RIGHT URTERAL STENT PLACEMENT;  Surgeon: Crist Fat, MD;  Location: [facility] ORS;  Service: Urology;  Laterality: Right;   ESOPHAGOGASTRODUODENOSCOPY     HERNIA REPAIR     HOLMIUM LASER APPLICATION Right 06/09/2015   Procedure: HOLMIUM LASER APPLICATION;  Surgeon: Crist Fat, MD;  Location: [facility] ORS;  Service: Urology;  Laterality: Right;   LATISSIMUS FLAP TO BREAST Left 09/08/2014   Procedure: LEFT LATISSIMUS FLAP TO BREAST WITH SALINE IMPLANT FOR BREAST RECONSTRUCTION;  Surgeon: Etter Sjogren, MD;  Location: [facility] OR;  Service: Plastics;  Laterality: Left;   LUMBAR FUSION  01/2018   L5-S1 transitional segmental laminectomy and Fusion   MASTECTOMY Right 1996    chemotherapy. pt. states 13 lymph nodes were removed   MASTECTOMY COMPLETE / SIMPLE W/ SENTINEL NODE BIOPSY Left 07/28/2014   MASTECTOMY W/ SENTINEL NODE BIOPSY Left 07/28/2014   Procedure: LEFT MASTECTOMY WITH SENTINEL LYMPH  NODE MAPPING;  Surgeon: Chevis Pretty III, MD;  Location: [facility] OR;  Service: General;  Laterality: Left;   MELANOMA EXCISION Right 2010   From elbow-- Done at [facility]    NISSEN FUNDOPLICATION  09/2008   RECONSTRUCTION BREAST IMMEDIATE / DELAYED W/ TISSUE EXPANDER Left 07/28/2014   TEMPOROMANDIBULAR JOINT SURGERY Bilateral 1987   TONSILLECTOMY     Social History   Social History Narrative   She lives with husband.     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   Review of Systems As per HPI  Objective:   Physical Exam @BP  132/74   Pulse 76   Ht 5\' 5"  (1.651 m)   Wt 182 lb (82.6 kg)   LMP  (LMP Unknown)   BMI 30.29 kg/m @  General:  NAD Eyes:   anicteric Lungs:  clear Heart::  S1S2 no rubs, murmurs or gallops Abdomen:  soft and mildly tender epigastrium, BS+ negative Carnett's Ext:   no edema, cyanosis or clubbing    Data Reviewed:  See HPI as per HPI

## 2022-10-16 NOTE — Patient Instructions (Signed)
You have been scheduled for an endoscopy. Please follow written instructions given to you at your visit today. If you use inhalers (even only as needed), please bring them with you on the day of your procedure.  The [facility] providers would like to encourage you to use [facility] to communicate with providers for non-urgent requests or questions.  Due to long hold times on the telephone, sending your provider a message by [facility] may be a faster and more efficient way to get a response.  Please allow 48 business hours for a response.  Please remember that this is for non-urgent requests.   Due to recent changes in healthcare laws, you may see the results of your imaging and laboratory studies on MyChart before your provider has had a chance to review them.  We understand that in some cases there may be results that are confusing or concerning to you. Not all laboratory results come back in the same time frame and the provider may be waiting for multiple results in order to interpret others.  Please give [place] 48 hours in order for your provider to thoroughly review all the results before contacting the office for clarification of your results.   Thank you for choosing me and [facility].  Stan Head, MD

## 2022-10-17 ENCOUNTER — Ambulatory Visit (AMBULATORY_SURGERY_CENTER): Payer: Medicare HMO | Admitting: Internal Medicine

## 2022-10-17 ENCOUNTER — Encounter: Payer: Self-pay | Admitting: Internal Medicine

## 2022-10-17 VITALS — BP 155/67 | HR 86 | Temp 97.8°F | Resp 18 | Ht 65.0 in | Wt 182.0 lb

## 2022-10-17 DIAGNOSIS — K222 Esophageal obstruction: Secondary | ICD-10-CM

## 2022-10-17 DIAGNOSIS — R1319 Other dysphagia: Secondary | ICD-10-CM | POA: Diagnosis not present

## 2022-10-17 MED ORDER — SODIUM CHLORIDE 0.9 % IV SOLN: 500.0000 mL | Freq: Once | INTRAVENOUS | Status: DC

## 2022-10-17 NOTE — Op Note (Signed)
[facility] Patient Name: [name] Procedure Date: 10/17/2022 9:27 AM MRN: [id number] Endoscopist: Iva Boop , MD, [id number] Age: 77 Referring MD:  Date of Birth: 21-Feb-1946 Gender: Female Account #: [id number] Procedure:                Upper GI endoscopy Indications:              Dysphagia Medicines:                Monitored Anesthesia Care Procedure:                Pre-Anesthesia Assessment:                           - Prior to the procedure, a History and Physical                            was performed, and patient medications and                            allergies were reviewed. The patient's tolerance of                            previous anesthesia was also reviewed. The risks                            and benefits of the procedure and the sedation                            options and risks were discussed with the patient.                            All questions were answered, and informed consent                            was obtained. Prior Anticoagulants: The patient has                            taken no anticoagulant or antiplatelet agents. ASA                            Grade Assessment: III - A patient with severe                            systemic disease. After reviewing the risks and                            benefits, the patient was deemed in satisfactory                            condition to undergo the procedure.                           After obtaining informed consent, the endoscope was  passed under direct vision. Throughout the                            procedure, the patient's blood pressure, pulse, and                            oxygen saturations were monitored continuously. The                            Olympus Scope [id number] was introduced through the                            mouth, and advanced to the second part of duodenum.                            The upper GI endoscopy was accomplished  without                            difficulty. The patient tolerated the procedure                            well. Scope In: Scope Out: Findings:                 One benign-appearing, intrinsic mild stenosis was                            found in the upper third of the esophagus. The                            stenosis was traversed. The scope was withdrawn.                            Dilation was performed with a Maloney dilator with                            mild resistance at 54 Fr. The dilation site was                            examined following endoscope reinsertion and showed                            moderate mucosal disruption. Estimated blood loss                            was minimal.                           Evidence of a Nissen fundoplication was found at                            the gastroesophageal junction. The wrap appeared                            loose.  The exam was otherwise without abnormality.                           The cardia and gastric fundus were normal on                            retroflexion. Complications:            No immediate complications. Estimated Blood Loss:     Estimated blood loss was minimal. Impression:               - Benign-appearing esophageal stenosis. Subtle                            stenosis Dilated 54 Fr as last year with similar                            result                           - A Nissen fundoplication was found. The wrap                            appears loose.                           - The examination was otherwise normal.                           - No specimens collected. Recommendation:           - Patient has a contact number available for                            emergencies. The signs and symptoms of potential                            delayed complications were discussed with the                            patient. Return to normal activities tomorrow.                             Written discharge instructions were provided to the                            patient.                           - Clear liquids x 1 hour then soft foods rest of                            day. Start prior diet tomorrow.                           - Continue present medications. Await response to  dilation. Altered taste and other symptoms may be                            post virtal? Iva Boop, MD 10/17/2022 10:05:14 AM This report has been signed electronically.

## 2022-10-17 NOTE — Progress Notes (Signed)
0936 Robinul 0.1 mg IV given due large amount of secretions upon assessment.  MD made aware, vss  ?

## 2022-10-17 NOTE — Progress Notes (Signed)
Report given to PACU, vss 

## 2022-10-17 NOTE — Patient Instructions (Addendum)
I dilated the esophagus - it was slightly narrowed at the top based upon seeing some dilation effect (expected disruption of the lining) after the dilation. This was similar to what I saw and did last year. Otherwise things are ok except the fundoplication is loose, as it was last year, also.  This should help you swallow better. If not - let me know.  Hopefully the changes in taste and other symptoms will go away with time. I did not see any cause for that - may be related to whatever illness you had in the beginning of this.  I appreciate the opportunity to care for you. Iva Boop, MD, [facility]  Resume your previous medications.  YOU HAD AN ENDOSCOPIC PROCEDURE TODAY AT THE [facility]:   Refer to the procedure report that was given to you for any specific questions about what was found during the examination.  If the procedure report does not answer your questions, please call your gastroenterologist to clarify.  If you requested that your care partner not be given the details of your procedure findings, then the procedure report has been included in a sealed envelope for you to review at your convenience later.  YOU SHOULD EXPECT: Some feelings of bloating in the abdomen. Passage of more gas than usual.  Walking can help get rid of the air that was put into your GI tract during the procedure and reduce the bloating. If you had a lower endoscopy (such as a colonoscopy or flexible sigmoidoscopy) you may notice spotting of blood in your stool or on the toilet paper. If you underwent a bowel prep for your procedure, you may not have a normal bowel movement for a few days.  Please Note:  You might notice some irritation and congestion in your nose or some drainage.  This is from the oxygen used during your procedure.  There is no need for concern and it should clear up in a day or so.  SYMPTOMS TO REPORT IMMEDIATELY:   Following upper endoscopy (EGD)  Vomiting of blood or coffee  ground material  New chest pain or pain under the shoulder blades  Painful or persistently difficult swallowing  New shortness of breath  Fever of 100F or higher  Black, tarry-looking stools  For urgent or emergent issues, a gastroenterologist can be reached at any hour by calling [phone]. Do not use MyChart messaging for urgent concerns.    DIET:  We do recommend nothing by mouth until 11:00 am.  Clear liquids from 11-12.  Then a soft diet for the rest of today. Then you may proceed to your regular diet tomorrow.  Drink plenty of fluids but you should avoid alcoholic beverages for 24 hours.  ACTIVITY:  You should plan to take it easy for the rest of today and you should NOT DRIVE or use heavy machinery until tomorrow (because of the sedation medicines used during the test).    FOLLOW UP: Our staff will call the number listed on your records the next business day following your procedure.  We will call around 7:15- 8:00 am to check on you and address any questions or concerns that you may have regarding the information given to you following your procedure. If we do not reach you, we will leave a message.       SIGNATURES/CONFIDENTIALITY: You and/or your care partner have signed paperwork which will be entered into your electronic medical record.  These signatures attest to the fact that that  the information above on your After Visit Summary has been reviewed and is understood.  Full responsibility of the confidentiality of this discharge information lies with you and/or your care-partner.

## 2022-10-17 NOTE — Progress Notes (Signed)
History and Physical Interval Note:  10/17/2022 9:33 AM  [name]  has presented today for endoscopic procedure(s), with the diagnosis of  Encounter Diagnosis  Name Primary?   Esophageal dysphagia Yes  .  The various methods of evaluation and treatment have been discussed with the patient and/or family. After consideration of risks, benefits and other options for treatment, the patient has consented to  the endoscopic procedure(s).   The patient's history has been reviewed, patient examined, no change in status, stable for endoscopic procedure(s).  I have reviewed the patient's chart and labs.  Questions were answered to the patient's satisfaction.     Iva Boop, MD, [facility]

## 2022-10-17 NOTE — Progress Notes (Signed)
0958 BP 181/115, Labetalol given IV, MD update, vss

## 2022-10-18 ENCOUNTER — Telehealth: Payer: Self-pay | Admitting: *Deleted

## 2022-10-18 NOTE — Telephone Encounter (Signed)
  Follow up Call-    Row Labels 10/17/2022    9:09 AM 11/20/2021    3:06 PM 11/20/2021    3:03 PM 01/02/2021    8:03 AM  Call back number   Section Header. No data exists in this row.      Post procedure Call Back phone  #   [phone]  [phone]  Permission to leave phone message   Yes  Yes Yes     Patient questions:  Do you have a fever, pain , or abdominal swelling? No. Pain Score  0 *  Have you tolerated food without any problems? Yes.    Have you been able to return to your normal activities? Yes.    Do you have any questions about your discharge instructions: Diet   No. Medications  No. Follow up visit  No.  Do you have questions or concerns about your Care? Yes.    Actions: * If pain score is 4 or above: No action needed, pain <4.   Slight bruising on cheek. Never had this happen  before .  Will call if any other issues.

## 2022-10-22 ENCOUNTER — Other Ambulatory Visit: Payer: Self-pay | Admitting: Internal Medicine

## 2022-11-06 DIAGNOSIS — M5416 Radiculopathy, lumbar region: Secondary | ICD-10-CM | POA: Diagnosis not present

## 2022-11-17 ENCOUNTER — Other Ambulatory Visit: Payer: Self-pay | Admitting: Cardiology

## 2022-11-17 DIAGNOSIS — R931 Abnormal findings on diagnostic imaging of heart and coronary circulation: Secondary | ICD-10-CM

## 2022-11-17 DIAGNOSIS — E7800 Pure hypercholesterolemia, unspecified: Secondary | ICD-10-CM

## 2022-11-17 DIAGNOSIS — I25118 Atherosclerotic heart disease of native coronary artery with other forms of angina pectoris: Secondary | ICD-10-CM

## 2022-12-03 DIAGNOSIS — M5416 Radiculopathy, lumbar region: Secondary | ICD-10-CM | POA: Diagnosis not present

## 2022-12-04 DIAGNOSIS — H524 Presbyopia: Secondary | ICD-10-CM | POA: Diagnosis not present

## 2022-12-04 DIAGNOSIS — Z0100 Encounter for examination of eyes and vision without abnormal findings: Secondary | ICD-10-CM | POA: Diagnosis not present

## 2022-12-05 DIAGNOSIS — M5416 Radiculopathy, lumbar region: Secondary | ICD-10-CM | POA: Diagnosis not present

## 2022-12-05 DIAGNOSIS — M48062 Spinal stenosis, lumbar region with neurogenic claudication: Secondary | ICD-10-CM | POA: Diagnosis not present

## 2022-12-05 DIAGNOSIS — M4326 Fusion of spine, lumbar region: Secondary | ICD-10-CM | POA: Diagnosis not present

## 2022-12-09 ENCOUNTER — Telehealth: Payer: Self-pay | Admitting: *Deleted

## 2022-12-09 NOTE — Telephone Encounter (Signed)
   Pre-operative Risk Assessment    Patient Name: [name]  DOB: November 07, 1945 MRN: [id number]      Request for Surgical Clearance    Procedure:   REVISION T10-S1 FUSION WITH ILIAC  Date of Surgery:  Clearance TBD                                 Surgeon:  DR. Malachy Chamber Surgeon's Group or Practice Name:  [facility] Phone number:  [phone]  Fax number:  [phone]   Type of Clearance Requested:   - Medical ; ASA x 7 DAYS PRIOR   Type of Anesthesia:  General    Additional requests/questions:    Elpidio Anis   12/09/2022, 3:49 PM

## 2022-12-10 ENCOUNTER — Telehealth: Payer: Self-pay | Admitting: *Deleted

## 2022-12-10 NOTE — Telephone Encounter (Signed)
Pt has been scheduled for a tele visit, 12/20/22 1:40.  Consent on file / medications reconciled.    Patient Consent for Virtual Visit        [name] has provided verbal consent on 12/10/2022 for a virtual visit (video or telephone).   CONSENT FOR VIRTUAL VISIT FOR:  [name]  By participating in this virtual visit I agree to the following:  I hereby voluntarily request, consent and authorize [facility] and its employed or contracted physicians, physician assistants, nurse practitioners or other licensed health care professionals (the Practitioner), to provide me with telemedicine health care services (the "Services") as deemed necessary by the treating Practitioner. I acknowledge and consent to receive the Services by the Practitioner via telemedicine. I understand that the telemedicine visit will involve communicating with the Practitioner through live audiovisual communication technology and the disclosure of certain medical information by electronic transmission. I acknowledge that I have been given the opportunity to request an in-person assessment or other available alternative prior to the telemedicine visit and am voluntarily participating in the telemedicine visit.  I understand that I have the right to withhold or withdraw my consent to the use of telemedicine in the course of my care at any time, without affecting my right to future care or treatment, and that the Practitioner or I may terminate the telemedicine visit at any time. I understand that I have the right to inspect all information obtained and/or recorded in the course of the telemedicine visit and may receive copies of available information for a reasonable fee.  I understand that some of the potential risks of receiving the Services via telemedicine include:  Delay or interruption in medical evaluation due to technological equipment failure or disruption; Information transmitted may not be sufficient  (e.g. poor resolution of images) to allow for appropriate medical decision making by the Practitioner; and/or  In rare instances, security protocols could fail, causing a breach of personal health information.  Furthermore, I acknowledge that it is my responsibility to provide information about my medical history, conditions and care that is complete and accurate to the best of my ability. I acknowledge that Practitioner's advice, recommendations, and/or decision may be based on factors not within their control, such as incomplete or inaccurate data provided by me or distortions of diagnostic images or specimens that may result from electronic transmissions. I understand that the practice of medicine is not an exact science and that Practitioner makes no warranties or guarantees regarding treatment outcomes. I acknowledge that a copy of this consent can be made available to me via my patient portal [facility] MyChart), or I can request a printed copy by calling the office of [facility].    I understand that my insurance will be billed for this visit.   I have read or had this consent read to me. I understand the contents of this consent, which adequately explains the benefits and risks of the Services being provided via telemedicine.  I have been provided ample opportunity to ask questions regarding this consent and the Services and have had my questions answered to my satisfaction. I give my informed consent for the services to be provided through the use of telemedicine in my medical care

## 2022-12-10 NOTE — Telephone Encounter (Signed)
Pt has been scheduled for a tele visit, 12/20/22 1:40.  Consent on file / medications reconciled.

## 2022-12-10 NOTE — Telephone Encounter (Signed)
   Name: [name]  DOB: 1945-09-25  MRN: [id number]  Primary Cardiologist: None   Preoperative team, please contact this patient and set up a phone call appointment for further preoperative risk assessment. Please obtain consent and complete medication review. Thank you for your help.  I confirm that guidance regarding antiplatelet and oral anticoagulation therapy has been completed and, if necessary, noted below.  Her aspirin is not prescribed by cardiology.  Recommendations for holding aspirin will need to come from prescribing provider.   Ronney Asters, NP 12/10/2022, 12:56 PM [facility]

## 2022-12-13 ENCOUNTER — Encounter: Payer: Self-pay | Admitting: Family

## 2022-12-13 ENCOUNTER — Ambulatory Visit (INDEPENDENT_AMBULATORY_CARE_PROVIDER_SITE_OTHER): Payer: Medicare HMO | Admitting: Family

## 2022-12-13 VITALS — BP 118/70 | HR 88 | Ht 65.0 in | Wt 181.0 lb

## 2022-12-13 DIAGNOSIS — M546 Pain in thoracic spine: Secondary | ICD-10-CM | POA: Diagnosis not present

## 2022-12-13 DIAGNOSIS — G8929 Other chronic pain: Secondary | ICD-10-CM | POA: Diagnosis not present

## 2022-12-13 NOTE — Progress Notes (Signed)
[name] is a 77 y.o. female with the following history as recorded in [employer]:  Patient Active Problem List   Diagnosis Date Noted   Arthritis of left knee 12/26/2021   Allergy 10/17/2021   Lichen sclerosus 12/06/2020   Dizziness 10/27/2020   Vitamin B12 deficiency    Mild anxiety    Kidney stone    History of hiatal hernia    History of chicken pox    GERD (gastroesophageal reflux disease)    Gastric polyps    Dyspnea    Depression    Complication of anesthesia    Chronic back pain    Blood dyscrasia    Arthritis    Ankle effusion, left 03/23/2020   Rheumatoid arthritis (HCC) 11/02/2018   Episode of recurrent major depressive disorder (HCC) 12/05/2017   Primary osteoarthritis of both hands 07/09/2017   Primary osteoarthritis of both knees 07/09/2017   DDD (degenerative disc disease), cervical 07/09/2017   DDD (degenerative disc disease), lumbar 07/09/2017   Peripheral edema 11/26/2016   Medicare annual wellness visit, subsequent 06/30/2016   Hx of adenomatous polyp of colon 07/03/2015   Viral URI with cough 09/11/2014   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   Breast cancer, left breast (HCC) 07/28/2014   Family history of breast cancer    Family history of colon cancer    Family history of pancreatic cancer    Malignant neoplasm of upper-outer quadrant of left breast in female, 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   Epigastric pain 02/02/2014   Peripheral neuropathy 01/01/2013   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   S/P laparoscopic fundoplication 08/09/2011   Steatosis of liver 12/07/2009   COAGULOPATHY 07/12/2008   Melanoma (HCC) 2010   CHEST XRAY, ABNORMAL 01/04/2008   Chronic cough 12/07/2007   Vitamin D deficiency 04/14/2007   VITAMIN B12 DEFICIENCY 02/27/2007   Essential hypertension 02/27/2007   GERD 02/27/2007   Fibromyalgia 02/27/2007   Breast cancer, right breast (HCC) 1995    Current Outpatient Medications  Medication Sig  Dispense Refill   amLODipine (NORVASC) 5 MG tablet Take 1 tablet (5 mg total) by mouth daily. 90 tablet 2   aspirin EC 81 MG tablet Take 81 mg by mouth daily. Swallow whole.     cholecalciferol (VITAMIN D3) 25 MCG (1000 UNIT) tablet Take 1,000 Units by mouth daily. Patient takes 2000 units     citalopram (CELEXA) 20 MG tablet TAKE ONE TABLET BY MOUTH EVERY MORNING 90 tablet 1   Cyanocobalamin (VITAMIN B-12) 5000 MCG TBDP Take by mouth.     Evolocumab (REPATHA SURECLICK) 140 MG/ML SOAJ Inject 140 mg into the skin every 14 (fourteen) days. 6 mL 1   furosemide (LASIX) 40 MG tablet TAKE ONE TABLET BY MOUTH EVERY MORNING 90 tablet 1   isosorbide mononitrate (IMDUR) 30 MG 24 hr tablet TAKE ONE TABLET BY MOUTH EVERY EVENING 90 tablet 3   meloxicam (MOBIC) 15 MG tablet Take 1 tablet (15 mg total) by mouth daily. (Patient taking differently: Take 15 mg by mouth daily. AS NEEDED FOR SPASMS) 30 tablet 0   omeprazole (PRILOSEC) 40 MG capsule TAKE ONE CAPSULE BY MOUTH ONCE DAILY 90 capsule 3   pantoprazole (PROTONIX) 40 MG tablet Take 1 tablet (40 mg total) by mouth 2 (two) times daily before a meal. (Patient taking differently: Take 40 mg by mouth daily.) 60 tablet 2   No current facility-administered medications for this visit.    Allergies: Other, Silodosin, Ace inhibitors, Buprenorphine hcl,  Morphine and codeine, Codeine, Sulfonamide derivatives, and Telmisartan  Past Medical History:  Diagnosis Date   Allergy    Ankle effusion, left 03/23/2020   Arthritis    "spine" (07/28/2014)   Blood dyscrasia    bruises and bleed easily   Breast cancer, left breast (HCC) 07/28/2014   Breast cancer, right breast (HCC) 1995   CHEST XRAY, ABNORMAL 01/04/2008   Qualifier: Diagnosis of  By: Plotnikov MD, Georgina Quint    Chronic back pain    "all over"   Chronic cough 12/07/2007   Could be related to GERD - relapsed    COAGULOPATHY 07/12/2008   Qualifier: Diagnosis of  By: Koleen Distance CMA (AAMA), Leisha      Complication of anesthesia    went to sleep easily but hard to wake up up until elbow OR in 2010   CVA (cerebral vascular accident) (HCC)    2022   DDD (degenerative disc disease), cervical 07/09/2017   DDD (degenerative disc disease), lumbar 07/09/2017   With spinal stenosis.   Dr. Dutch Quint   Depression    Dizziness 10/27/2020   Dyspnea    Normal Spirometry 03/2008 EF 65% BNP normal 11/2007   Episode of recurrent major depressive disorder (HCC) 12/05/2017   Essential hypertension 02/27/2007   Mild Off meds 2013  BP Readings from Last 3 Encounters:  02/10/12 128/90  08/09/11 134/62  03/06/11 150/86      Family history of breast cancer    Family history of colon cancer    Family history of pancreatic cancer    Fibromyalgia 02/27/2007   Chronic    Gastric polyps    GERD 02/27/2007   S/p Nissen's 2010    GERD (gastroesophageal reflux disease)    History of chicken pox    History of hiatal hernia    Hx of adenomatous polyp of colon 07/03/2015   Kidney stone    right kidney    Lichen sclerosus    Malignant neoplasm of upper-outer quadrant of left breast in female, 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   Medicare annual wellness visit, subsequent 06/30/2016   Melanoma (HCC) 2010   "right elbow; [facility]"   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 ([facility])   Mild anxiety    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   Peripheral edema 11/26/2016   Peripheral neuropathy 01/01/2013   Primary osteoarthritis of both hands 07/09/2017   Primary osteoarthritis of both knees 07/09/2017   Chondromalacia patella   Rheumatoid arthritis (HCC) 11/02/2018   S/P laparoscopic fundoplication 08/09/2011   Steatosis of liver 12/07/2009   Qualifier: Diagnosis of  By: Surface RN, Donna     Viral URI with cough 09/11/2014   Vitamin B12 deficiency    VITAMIN B12 DEFICIENCY 02/27/2007   Chronic    Vitamin D deficiency     Past Surgical History:  Procedure Laterality Date    BREAST BIOPSY Left 04/2014   BREAST RECONSTRUCTION WITH PLACEMENT OF TISSUE EXPANDER AND FLEX HD (ACELLULAR HYDRATED DERMIS) Left 07/28/2014   Procedure: LEFT BREAST RECONSTRUCTION PLACEMENT OF LEFT TISSUE EXPANDER ;  Surgeon: Etter Sjogren, MD;  Location: [facility] OR;  Service: Plastics;  Laterality: Left;   BREAST RECONSTRUCTION WITH PLACEMENT OF TISSUE EXPANDER AND FLEX HD (ACELLULAR HYDRATED DERMIS) Left 09/08/2014   Procedure: REMOVAL OF TISSUE EXPANDER FROM LEFT BREAST;  Surgeon: Etter Sjogren, MD;  Location: [facility] OR;  Service: Plastics;  Laterality: Left;   BUNIONECTOMY Bilateral 1970's   COLONOSCOPY  Dr Jarold Motto   CYSTOSCOPY WITH RETROGRADE PYELOGRAM, URETEROSCOPY AND STENT PLACEMENT Right 06/09/2015   Procedure: CYSTOSCOPY WITH RIGHT RETROGRADE PYELOGRAM, RIGHT URETEROSCOPY AND RIGHT URTERAL STENT PLACEMENT;  Surgeon: Crist Fat, MD;  Location: [facility] ORS;  Service: Urology;  Laterality: Right;   ESOPHAGOGASTRODUODENOSCOPY     HERNIA REPAIR     HOLMIUM LASER APPLICATION Right 06/09/2015   Procedure: HOLMIUM LASER APPLICATION;  Surgeon: Crist Fat, MD;  Location: [facility] ORS;  Service: Urology;  Laterality: Right;   LATISSIMUS FLAP TO BREAST Left 09/08/2014   Procedure: LEFT LATISSIMUS FLAP TO BREAST WITH SALINE IMPLANT FOR BREAST RECONSTRUCTION;  Surgeon: Etter Sjogren, MD;  Location: [facility] OR;  Service: Plastics;  Laterality: Left;   LUMBAR FUSION  01/2018   L5-S1 transitional segmental laminectomy and Fusion   MASTECTOMY Right 1996    chemotherapy. pt. states 13 lymph nodes were removed   MASTECTOMY COMPLETE / SIMPLE W/ SENTINEL NODE BIOPSY Left 07/28/2014   MASTECTOMY W/ SENTINEL NODE BIOPSY Left 07/28/2014   Procedure: LEFT MASTECTOMY WITH SENTINEL LYMPH NODE MAPPING;  Surgeon: Chevis Pretty III, MD;  Location: [facility] OR;  Service: General;  Laterality: Left;   MELANOMA EXCISION Right 2010   From elbow-- Done at [facility]    NISSEN FUNDOPLICATION  09/2008   RECONSTRUCTION BREAST IMMEDIATE / DELAYED  W/ TISSUE EXPANDER Left 07/28/2014   TEMPOROMANDIBULAR JOINT SURGERY Bilateral 1987   TONSILLECTOMY      Family History  Problem Relation Age of Onset   Colon cancer Cousin 91       double first cousin   Colon cancer Cousin 35       double first cousin   Colon polyps Father        between 10-20   Dementia Father    CAD Father        CABG at age 27   Stroke Mother    Pancreatic cancer Brother 6   Colon polyps Brother        between 10-20   Cancer Paternal Uncle        NOS   Anemia Paternal Grandfather        pernicious anemia   Breast cancer Cousin 33       double first cousin   Breast cancer Cousin 40       paternal cousin   Stroke Other        F 1st degree relative 48, M 1st degree relative   Esophageal cancer Neg Hx    Stomach cancer Neg Hx     Social History   Tobacco Use   Smoking status: Never   Smokeless tobacco: Never   Tobacco comments:    Regular Exercise - Yes  Substance Use Topics   Alcohol use: No    Subjective:   Patient is requesting surgical clearance for upcoming back surgery- revision T10-S1 Fusion; is scheduled to have cardiac appointment next week for clearance as well;  No acute concerns today;   Objective:  Vitals:   12/13/22 0817  BP: 118/70  Pulse: 88  SpO2: 96%  Weight: 181 lb (82.1 kg)  Height: 5\' 5"  (1.651 m)    General: Well developed, well nourished, in no acute distress  Skin : Warm and dry.  Head: Normocephalic and atraumatic  Eyes: Sclera and conjunctiva clear; pupils round and reactive to light; extraocular movements intact  Ears: External normal; canals clear; tympanic membranes normal  Oropharynx: Pink, supple. No suspicious lesions  Neck: Supple without thyromegaly, adenopathy  Lungs:  Respirations unlabored; clear to auscultation bilaterally without wheeze, rales, rhonchi  CVS exam: normal rate and regular rhythm.  Neurologic: Alert and oriented; speech intact; face symmetrical; moves all extremities well; CNII-XII  intact without focal deficit   Assessment:  1. Chronic thoracic back pain, unspecified back pain laterality     Plan:  Surgical clearance form completed; she will see her cardiologist as scheduled to discuss clearance as well; unable to do EKG or labs today due to internet outage at time of appointment. She will plan to get these updated as needed as part of her upcoming pre-op appointment.  No follow-ups on file.  No orders of the defined types were placed in this encounter.   Requested Prescriptions    No prescriptions requested or ordered in this encounter

## 2022-12-17 ENCOUNTER — Other Ambulatory Visit: Payer: Self-pay | Admitting: Orthopedic Surgery

## 2022-12-17 DIAGNOSIS — M5416 Radiculopathy, lumbar region: Secondary | ICD-10-CM

## 2022-12-17 DIAGNOSIS — M4326 Fusion of spine, lumbar region: Secondary | ICD-10-CM

## 2022-12-17 DIAGNOSIS — M48062 Spinal stenosis, lumbar region with neurogenic claudication: Secondary | ICD-10-CM

## 2022-12-17 DIAGNOSIS — M4126 Other idiopathic scoliosis, lumbar region: Secondary | ICD-10-CM

## 2022-12-18 ENCOUNTER — Other Ambulatory Visit: Payer: Self-pay | Admitting: Family

## 2022-12-18 ENCOUNTER — Other Ambulatory Visit: Payer: Self-pay | Admitting: Cardiology

## 2022-12-18 DIAGNOSIS — I25118 Atherosclerotic heart disease of native coronary artery with other forms of angina pectoris: Secondary | ICD-10-CM

## 2022-12-18 DIAGNOSIS — R609 Edema, unspecified: Secondary | ICD-10-CM

## 2022-12-18 DIAGNOSIS — F419 Anxiety disorder, unspecified: Secondary | ICD-10-CM

## 2022-12-19 ENCOUNTER — Ambulatory Visit: Payer: Medicare HMO | Admitting: Internal Medicine

## 2022-12-19 NOTE — Progress Notes (Signed)
Virtual Visit via Telephone Note   Because of [name]'s co-morbid illnesses, she is at least at moderate risk for complications without adequate follow up.  This format is felt to be most appropriate for this patient at this time.  The patient did not have access to video technology/had technical difficulties with video requiring transitioning to audio format only (telephone).  All issues noted in this document were discussed and addressed.  No physical exam could be performed with this format.  Please refer to the patient's chart for her consent to telehealth for [facility].  Evaluation Performed:  Preoperative cardiovascular risk assessment _____________   Date:  12/20/2022   Patient ID:  [name], DOB 08-04-1945, MRN [id number] Patient Location:  Home Provider location:   Office  Primary Care Provider:  Olive Bass, FNP Primary Cardiologist:  Dr. Dulce Sellar   Chief Complaint / Patient Profile   77 y.o. y/o female with a h/o coronary artery disease, coronary CTA score on 2022 63, with moderate stenosis in the first diagonal and proximal ramus artery, with subsequent FFR normal in both vessels,, which mildly, # there is hypercholesterolemia, hypertension, who is pending revision T10-S1 fusion with iliac, by Dr. Sullivan Lone, [facility] on date to be determined.  She presents today for telephonic preoperative cardiovascular risk assessment.  History of Present Illness    [name] is a 77 y.o. female who presents via audio/video conferencing for a telehealth visit today.  Pt was last seen in cardiology clinic on 07/03/2022 by Dr. Norman Herrlich.  At that time [name] was doing well .  The patient is now pending procedure as outlined above. Since her last visit, she done well Activity is limited by chronic back pain only, no cardiac complaints.   Past Medical History    Past Medical History:  Diagnosis Date   Allergy     Ankle effusion, left 03/23/2020   Arthritis    "spine" (07/28/2014)   Blood dyscrasia    bruises and bleed easily   Breast cancer, left breast (HCC) 07/28/2014   Breast cancer, right breast (HCC) 1995   CHEST XRAY, ABNORMAL 01/04/2008   Qualifier: Diagnosis of  By: Plotnikov MD, Georgina Quint    Chronic back pain    "all over"   Chronic cough 12/07/2007   Could be related to GERD - relapsed    COAGULOPATHY 07/12/2008   Qualifier: Diagnosis of  By: Koleen Distance CMA (AAMA), Leisha     Complication of anesthesia    went to sleep easily but hard to wake up up until elbow OR in 2010   CVA (cerebral vascular accident) (HCC)    2022   DDD (degenerative disc disease), cervical 07/09/2017   DDD (degenerative disc disease), lumbar 07/09/2017   With spinal stenosis.   Dr. Dutch Quint   Depression    Dizziness 10/27/2020   Dyspnea    Normal Spirometry 03/2008 EF 65% BNP normal 11/2007   Episode of recurrent major depressive disorder (HCC) 12/05/2017   Essential hypertension 02/27/2007   Mild Off meds 2013  BP Readings from Last 3 Encounters:  02/10/12 128/90  08/09/11 134/62  03/06/11 150/86      Family history of breast cancer    Family history of colon cancer    Family history of pancreatic cancer    Fibromyalgia 02/27/2007   Chronic    Gastric polyps    GERD 02/27/2007   S/p Nissen's 2010  GERD (gastroesophageal reflux disease)    History of chicken pox    History of hiatal hernia    Hx of adenomatous polyp of colon 07/03/2015   Kidney stone    right kidney    Lichen sclerosus    Malignant neoplasm of upper-outer quadrant of left breast in female, 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   Medicare annual wellness visit, subsequent 06/30/2016   Melanoma (HCC) 2010   "right elbow; [facility] at [facility]"   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 ([facility])   Mild anxiety    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   Peripheral edema 11/26/2016   Peripheral neuropathy 01/01/2013    Primary osteoarthritis of both hands 07/09/2017   Primary osteoarthritis of both knees 07/09/2017   Chondromalacia patella   Rheumatoid arthritis (HCC) 11/02/2018   S/P laparoscopic fundoplication 08/09/2011   Steatosis of liver 12/07/2009   Qualifier: Diagnosis of  By: Surface RN, Donna     Viral URI with cough 09/11/2014   Vitamin B12 deficiency    VITAMIN B12 DEFICIENCY 02/27/2007   Chronic    Vitamin D deficiency    Past Surgical History:  Procedure Laterality Date   BREAST BIOPSY Left 04/2014   BREAST RECONSTRUCTION WITH PLACEMENT OF TISSUE EXPANDER AND FLEX HD (ACELLULAR HYDRATED DERMIS) Left 07/28/2014   Procedure: LEFT BREAST RECONSTRUCTION PLACEMENT OF LEFT TISSUE EXPANDER ;  Surgeon: Etter Sjogren, MD;  Location: [facility] OR;  Service: Plastics;  Laterality: Left;   BREAST RECONSTRUCTION WITH PLACEMENT OF TISSUE EXPANDER AND FLEX HD (ACELLULAR HYDRATED DERMIS) Left 09/08/2014   Procedure: REMOVAL OF TISSUE EXPANDER FROM LEFT BREAST;  Surgeon: Etter Sjogren, MD;  Location: [facility] OR;  Service: Plastics;  Laterality: Left;   BUNIONECTOMY Bilateral 1970's   COLONOSCOPY     Dr Jarold Motto   CYSTOSCOPY WITH RETROGRADE PYELOGRAM, URETEROSCOPY AND STENT PLACEMENT Right 06/09/2015   Procedure: CYSTOSCOPY WITH RIGHT RETROGRADE PYELOGRAM, RIGHT URETEROSCOPY AND RIGHT URTERAL STENT PLACEMENT;  Surgeon: Crist Fat, MD;  Location: [facility] ORS;  Service: Urology;  Laterality: Right;   ESOPHAGOGASTRODUODENOSCOPY     HERNIA REPAIR     HOLMIUM LASER APPLICATION Right 06/09/2015   Procedure: HOLMIUM LASER APPLICATION;  Surgeon: Crist Fat, MD;  Location: [facility] ORS;  Service: Urology;  Laterality: Right;   LATISSIMUS FLAP TO BREAST Left 09/08/2014   Procedure: LEFT LATISSIMUS FLAP TO BREAST WITH SALINE IMPLANT FOR BREAST RECONSTRUCTION;  Surgeon: Etter Sjogren, MD;  Location: [facility] OR;  Service: Plastics;  Laterality: Left;   LUMBAR FUSION  01/2018   L5-S1 transitional segmental laminectomy and Fusion    MASTECTOMY Right 1996    chemotherapy. pt. states 13 lymph nodes were removed   MASTECTOMY COMPLETE / SIMPLE W/ SENTINEL NODE BIOPSY Left 07/28/2014   MASTECTOMY W/ SENTINEL NODE BIOPSY Left 07/28/2014   Procedure: LEFT MASTECTOMY WITH SENTINEL LYMPH NODE MAPPING;  Surgeon: Chevis Pretty III, MD;  Location: [facility] OR;  Service: General;  Laterality: Left;   MELANOMA EXCISION Right 2010   From elbow-- Done at [facility]    NISSEN FUNDOPLICATION  09/2008   RECONSTRUCTION BREAST IMMEDIATE / DELAYED W/ TISSUE EXPANDER Left 07/28/2014   TEMPOROMANDIBULAR JOINT SURGERY Bilateral 1987   TONSILLECTOMY      Allergies  Allergies  Allergen Reactions   Other Anaphylaxis and Swelling    Glue (eye lash glue, gorilla glue)   Silodosin Rash    Facial rash   Ace Inhibitors Other (See Comments)    REACTION: angioedema  Buprenorphine Hcl Other (See Comments)    "crazy"   Morphine And Codeine Other (See Comments)    "crazy" "crazy"   Codeine Rash   Sulfonamide Derivatives Rash   Telmisartan Rash    Home Medications    Prior to Admission medications   Medication Sig Start Date End Date Taking? Authorizing Provider  amLODipine (NORVASC) 5 MG tablet Take 1 tablet (5 mg total) by mouth daily. 07/30/22   Baldo Daub, MD  aspirin EC 81 MG tablet Take 81 mg by mouth daily. Swallow whole.    [provider]  cholecalciferol (VITAMIN D3) 25 MCG (1000 UNIT) tablet Take 1,000 Units by mouth daily. Patient takes 2000 units    [provider]  citalopram (CELEXA) 20 MG tablet Take 1 tablet (20 mg total) by mouth every morning. 12/18/22   Olive Bass, FNP  Cyanocobalamin (VITAMIN B-12) 5000 MCG TBDP Take by mouth.    [provider]  Evolocumab (REPATHA SURECLICK) 140 MG/ML SOAJ Inject 140 mg into the skin every 14 (fourteen) days. 11/18/22   Baldo Daub, MD  furosemide (LASIX) 40 MG tablet Take 1 tablet (40 mg total) by mouth every morning. 12/18/22   Olive Bass, FNP   isosorbide mononitrate (IMDUR) 30 MG 24 hr tablet TAKE ONE TABLET BY MOUTH EVERY MORNING 12/18/22   Baldo Daub, MD  meloxicam (MOBIC) 15 MG tablet Take 1 tablet (15 mg total) by mouth daily. Patient taking differently: Take 15 mg by mouth daily. AS NEEDED FOR SPASMS 09/12/22   Clayborne Dana, NP  omeprazole (PRILOSEC) 40 MG capsule TAKE ONE CAPSULE BY MOUTH ONCE DAILY 10/22/22   Iva Boop, MD  pantoprazole (PROTONIX) 40 MG tablet Take 1 tablet (40 mg total) by mouth 2 (two) times daily before a meal. Patient taking differently: Take 40 mg by mouth daily. 09/18/22   Sammuel Cooper, PA-C    Physical Exam    Vital Signs:  [name] does not have vital signs available for review today.  Given telephonic nature of communication, physical exam is limited. AAOx3. NAD. Normal affect.  Speech and respirations are unlabored.  Accessory Clinical Findings    None  Assessment & Plan    1.  Preoperative Cardiovascular Risk Assessment:  The patient was advised that if she develops new symptoms prior to surgery to contact our office to arrange for a follow-up visit, and she verbalized understanding.  According to the Revised Cardiac Risk Index (RCRI), her Perioperative Risk of Major Cardiac Event is (%): 0.9  Her Functional Capacity in METs is: 7.01 according to the [facility] Activity Status Index (DASI).   Therefore, based on ACC/AHA guidelines, patient would be at acceptable risk for the planned procedure without further cardiovascular testing. I will route this recommendation to the requesting party via Epic fax function.   Per office protocol, if patient is without any new symptoms or concerns at the time of their virtual visit, he/she may hold ASA for 5-7 days prior to procedure. Please resume ASA as soon as possible postprocedure, at the discretion of the surgeon.    A copy of this note will be routed to requesting surgeon.   Time:   Today, I have spent 10 minutes with the  patient with telehealth technology discussing medical history, symptoms, and management plan.     Joni Reining, NP  12/20/2022, 1:40 PM

## 2022-12-20 ENCOUNTER — Ambulatory Visit: Payer: Medicare HMO | Attending: Cardiology | Admitting: Adult Health

## 2022-12-20 DIAGNOSIS — Z01818 Encounter for other preprocedural examination: Secondary | ICD-10-CM

## 2022-12-23 ENCOUNTER — Encounter: Payer: Self-pay | Admitting: Orthopedic Surgery

## 2022-12-26 ENCOUNTER — Other Ambulatory Visit: Payer: Medicare HMO

## 2023-01-03 ENCOUNTER — Encounter: Payer: Self-pay | Admitting: Orthopedic Surgery

## 2023-01-07 ENCOUNTER — Other Ambulatory Visit: Payer: Medicare HMO

## 2023-01-23 ENCOUNTER — Encounter: Payer: Self-pay | Admitting: Family

## 2023-01-24 ENCOUNTER — Encounter (HOSPITAL_BASED_OUTPATIENT_CLINIC_OR_DEPARTMENT_OTHER): Payer: Self-pay | Admitting: Emergency Medicine

## 2023-01-24 ENCOUNTER — Other Ambulatory Visit: Payer: Self-pay

## 2023-01-24 ENCOUNTER — Other Ambulatory Visit (HOSPITAL_BASED_OUTPATIENT_CLINIC_OR_DEPARTMENT_OTHER): Payer: Self-pay

## 2023-01-24 ENCOUNTER — Emergency Department (HOSPITAL_BASED_OUTPATIENT_CLINIC_OR_DEPARTMENT_OTHER)
Admission: EM | Admit: 2023-01-24 | Discharge: 2023-01-24 | Disposition: A | Discharge status: Home / Self Care | Payer: Medicare HMO | Attending: Emergency Medicine | Admitting: Emergency Medicine

## 2023-01-24 DIAGNOSIS — Z7982 Long term (current) use of aspirin: Secondary | ICD-10-CM | POA: Diagnosis not present

## 2023-01-24 DIAGNOSIS — Z79899 Other long term (current) drug therapy: Secondary | ICD-10-CM | POA: Diagnosis not present

## 2023-01-24 DIAGNOSIS — R5383 Other fatigue: Secondary | ICD-10-CM | POA: Insufficient documentation

## 2023-01-24 DIAGNOSIS — Z853 Personal history of malignant neoplasm of breast: Secondary | ICD-10-CM | POA: Diagnosis not present

## 2023-01-24 DIAGNOSIS — R0789 Other chest pain: Secondary | ICD-10-CM | POA: Insufficient documentation

## 2023-01-24 DIAGNOSIS — T7840XA Allergy, unspecified, initial encounter: Secondary | ICD-10-CM | POA: Insufficient documentation

## 2023-01-24 DIAGNOSIS — I10 Essential (primary) hypertension: Secondary | ICD-10-CM | POA: Insufficient documentation

## 2023-01-24 LAB — CBC WITH DIFFERENTIAL/PLATELET
Abs Immature Granulocytes: 0.01 10*3/uL (ref 0.00–0.07)
Basophils Absolute: 0.1 10*3/uL (ref 0.0–0.1)
Basophils Relative: 1 %
Eosinophils Absolute: 0.3 10*3/uL (ref 0.0–0.5)
Eosinophils Relative: 6 %
HCT: 38.9 % (ref 36.0–46.0)
Hemoglobin: 13.1 g/dL (ref 12.0–15.0)
Immature Granulocytes: 0 %
Lymphocytes Relative: 28 %
Lymphs Abs: 1.5 10*3/uL (ref 0.7–4.0)
MCH: 30.8 pg (ref 26.0–34.0)
MCHC: 33.7 g/dL (ref 30.0–36.0)
MCV: 91.3 fL (ref 80.0–100.0)
Monocytes Absolute: 0.5 10*3/uL (ref 0.1–1.0)
Monocytes Relative: 9 %
Neutro Abs: 2.9 10*3/uL (ref 1.7–7.7)
Neutrophils Relative %: 56 %
Platelets: 167 10*3/uL (ref 150–400)
RBC: 4.26 MIL/uL (ref 3.87–5.11)
RDW: 13.2 % (ref 11.5–15.5)
WBC: 5.2 10*3/uL (ref 4.0–10.5)
nRBC: 0 % (ref 0.0–0.2)

## 2023-01-24 LAB — COMPREHENSIVE METABOLIC PANEL
ALT: 13 U/L (ref 0–44)
AST: 17 U/L (ref 15–41)
Albumin: 3.6 g/dL (ref 3.5–5.0)
Alkaline Phosphatase: 59 U/L (ref 38–126)
Anion gap: 6 (ref 5–15)
BUN: 12 mg/dL (ref 8–23)
CO2: 30 mmol/L (ref 22–32)
Calcium: 8.5 mg/dL — ABNORMAL LOW (ref 8.9–10.3)
Chloride: 106 mmol/L (ref 98–111)
Creatinine, Ser: 0.92 mg/dL (ref 0.44–1.00)
GFR, Estimated: >60 mL/min (ref >60)
Glucose, Bld: 94 mg/dL (ref 70–99)
Potassium: 3.3 mmol/L — ABNORMAL LOW (ref 3.5–5.1)
Sodium: 142 mmol/L (ref 135–145)
Total Bilirubin: 0.8 mg/dL (ref 0.3–1.2)
Total Protein: 6.4 g/dL — ABNORMAL LOW (ref 6.5–8.1)

## 2023-01-24 LAB — TROPONIN I (HIGH SENSITIVITY): Troponin I (High Sensitivity): 4 ng/L (ref <18)

## 2023-01-24 MED ORDER — FAMOTIDINE IN NACL 20-0.9 MG/50ML-% IV SOLN
20.0000 mg | Freq: Once | INTRAVENOUS | Status: AC
  Administered 2023-01-24: 20 mg via INTRAVENOUS
  Filled 2023-01-24: qty 50

## 2023-01-24 MED ORDER — DIPHENHYDRAMINE HCL 50 MG/ML IJ SOLN
25.0000 mg | Freq: Once | INTRAMUSCULAR | Status: AC
  Administered 2023-01-24: 25 mg via INTRAVENOUS
  Filled 2023-01-24: qty 1

## 2023-01-24 MED ORDER — PREDNISONE 10 MG PO TABS
40.0000 mg | ORAL_TABLET | Freq: Every day | ORAL | 0 refills | Status: AC
  Filled 2023-01-24: qty 16, 4d supply, fill #0

## 2023-01-24 MED ORDER — METHYLPREDNISOLONE SODIUM SUCC 125 MG IJ SOLR
125.0000 mg | Freq: Once | INTRAMUSCULAR | Status: AC
  Administered 2023-01-24: 125 mg via INTRAVENOUS
  Filled 2023-01-24: qty 2

## 2023-01-24 NOTE — Discharge Instructions (Addendum)
Take benadryl (diphenyhdramine) 25mg  every 6 hours and famotidine 20mg  over the counter as well for allergic symptoms for 48 hours.

## 2023-01-24 NOTE — ED Triage Notes (Signed)
Pt started to have facial itching since Tuesday.  This am she started to have facial swelling.  No acute respiratory distress.

## 2023-01-24 NOTE — ED Provider Notes (Signed)
[place] EMERGENCY DEPARTMENT AT [facility] [place] Provider Note   CSN: [id number] Arrival date & time: 01/24/23  1324     History {Add pertinent medical, surgical, social history, OB history to HPI:1} Chief Complaint  Patient presents with   Allergic Reaction    [name] is a 77 y.o. female.  HPI     Rash around eyes, itching Since Tuesday Mild chest tightness, fatigue No nausea/vomiting/diarrhea/lightheadedness tongue or throat swelling  Home Medications Prior to Admission medications   Medication Sig Start Date End Date Taking? Authorizing Provider  amLODipine (NORVASC) 5 MG tablet Take 1 tablet (5 mg total) by mouth daily. 07/30/22   Baldo Daub, MD  aspirin EC 81 MG tablet Take 81 mg by mouth daily. Swallow whole.    [provider]  cholecalciferol (VITAMIN D3) 25 MCG (1000 UNIT) tablet Take 1,000 Units by mouth daily. Patient takes 2000 units    [provider]  citalopram (CELEXA) 20 MG tablet Take 1 tablet (20 mg total) by mouth every morning. 12/18/22   Olive Bass, FNP  Cyanocobalamin (VITAMIN B-12) 5000 MCG TBDP Take by mouth.    [provider]  Evolocumab (REPATHA SURECLICK) 140 MG/ML SOAJ Inject 140 mg into the skin every 14 (fourteen) days. 11/18/22   Baldo Daub, MD  furosemide (LASIX) 40 MG tablet Take 1 tablet (40 mg total) by mouth every morning. 12/18/22   Olive Bass, FNP  isosorbide mononitrate (IMDUR) 30 MG 24 hr tablet TAKE ONE TABLET BY MOUTH EVERY MORNING 12/18/22   Baldo Daub, MD  meloxicam (MOBIC) 15 MG tablet Take 1 tablet (15 mg total) by mouth daily. Patient taking differently: Take 15 mg by mouth daily. AS NEEDED FOR SPASMS 09/12/22   Clayborne Dana, NP  omeprazole (PRILOSEC) 40 MG capsule TAKE ONE CAPSULE BY MOUTH ONCE DAILY 10/22/22   Iva Boop, MD  pantoprazole (PROTONIX) 40 MG tablet Take 1 tablet (40 mg total) by mouth 2 (two) times daily before a meal. Patient  taking differently: Take 40 mg by mouth daily. 09/18/22   Esterwood, Amy S, PA-C      Allergies    Other, Silodosin, Ace inhibitors, Buprenorphine hcl, Morphine and codeine, Codeine, Sulfonamide derivatives, and Telmisartan    Review of Systems   Review of Systems  Physical Exam Updated Vital Signs BP (!) 140/71 (BP Location: Left Arm)   Pulse 66   Temp 98.2 F (36.8 C) (Oral)   Resp 20   Ht 5\' 5"  (1.651 m)   Wt 82.6 kg   LMP  (LMP Unknown)   SpO2 99%   BMI 30.29 kg/m  Physical Exam  ED Results / Procedures / Treatments   Labs (all labs ordered are listed, but only abnormal results are displayed) Labs Reviewed  COMPREHENSIVE METABOLIC PANEL - Abnormal; Notable for the following components:      Result Value   Potassium 3.3 (*)    Calcium 8.5 (*)    Total Protein 6.4 (*)    All other components within normal limits  CBC WITH DIFFERENTIAL/PLATELET  TROPONIN I (HIGH SENSITIVITY)  TROPONIN I (HIGH SENSITIVITY)    EKG EKG Interpretation Date/Time:  Friday January 24 2023 09:18:41 EDT Ventricular Rate:  60 PR Interval:  175 QRS Duration:  81 QT Interval:  430 QTC Calculation: 430 R Axis:   61  Text Interpretation: Sinus rhythm Minimal ST depression, diffuse leads Baseline wander in lead(s) V1 No significant change since last tracing  Confirmed by Alvira Monday ([id number]) on 01/24/2023 10:32:48 AM  Radiology No results found.  Procedures Procedures  {Document cardiac monitor, telemetry assessment procedure when appropriate:1}  Medications Ordered in ED Medications  methylPREDNISolone sodium succinate (SOLU-MEDROL) 125 mg/2 mL injection 125 mg (125 mg Intravenous Given 01/24/23 0926)  diphenhydrAMINE (BENADRYL) injection 25 mg (25 mg Intravenous Given 01/24/23 0923)  famotidine (PEPCID) IVPB 20 mg premix (20 mg Intravenous New Bag/Given 01/24/23 0929)    ED Course/ Medical Decision Making/ A&P   {   Click here for ABCD2, HEART and other calculatorsREFRESH Note  before signing :1}                              Medical Decision Making Amount and/or Complexity of Data Reviewed Labs: ordered.  Risk Prescription drug management.   ***  {Document critical care time when appropriate:1} {Document review of labs and clinical decision tools ie heart score, Chads2Vasc2 etc:1}  {Document your independent review of radiology images, and any outside records:1} {Document your discussion with family members, caretakers, and with consultants:1} {Document social determinants of health affecting pt's care:1} {Document your decision making why or why not admission, treatments were needed:1} Final Clinical Impression(s) / ED Diagnoses Final diagnoses:  Allergic reaction, initial encounter    Rx / DC Orders ED Discharge Orders     None

## 2023-01-28 ENCOUNTER — Ambulatory Visit
Admission: RE | Admit: 2023-01-28 | Discharge: 2023-01-28 | Disposition: A | Discharge status: Home / Self Care | Payer: Medicare HMO | Source: Ambulatory Visit | Attending: Orthopedic Surgery | Admitting: Orthopedic Surgery

## 2023-01-28 DIAGNOSIS — M4326 Fusion of spine, lumbar region: Secondary | ICD-10-CM

## 2023-01-28 DIAGNOSIS — M545 Low back pain, unspecified: Secondary | ICD-10-CM | POA: Diagnosis not present

## 2023-01-28 DIAGNOSIS — I700 Atherosclerosis of aorta: Secondary | ICD-10-CM | POA: Diagnosis not present

## 2023-01-28 DIAGNOSIS — M4126 Other idiopathic scoliosis, lumbar region: Secondary | ICD-10-CM

## 2023-01-28 DIAGNOSIS — M48062 Spinal stenosis, lumbar region with neurogenic claudication: Secondary | ICD-10-CM

## 2023-01-28 DIAGNOSIS — M5416 Radiculopathy, lumbar region: Secondary | ICD-10-CM

## 2023-01-28 DIAGNOSIS — Z853 Personal history of malignant neoplasm of breast: Secondary | ICD-10-CM | POA: Diagnosis not present

## 2023-01-28 DIAGNOSIS — N200 Calculus of kidney: Secondary | ICD-10-CM | POA: Diagnosis not present

## 2023-01-28 DIAGNOSIS — M4316 Spondylolisthesis, lumbar region: Secondary | ICD-10-CM | POA: Diagnosis not present

## 2023-01-29 DIAGNOSIS — N760 Acute vaginitis: Secondary | ICD-10-CM | POA: Diagnosis not present

## 2023-02-05 DIAGNOSIS — M4804 Spinal stenosis, thoracic region: Secondary | ICD-10-CM | POA: Diagnosis not present

## 2023-02-05 DIAGNOSIS — M48062 Spinal stenosis, lumbar region with neurogenic claudication: Secondary | ICD-10-CM | POA: Diagnosis not present

## 2023-02-05 DIAGNOSIS — M5431 Sciatica, right side: Secondary | ICD-10-CM | POA: Diagnosis not present

## 2023-02-24 DIAGNOSIS — M5414 Radiculopathy, thoracic region: Secondary | ICD-10-CM | POA: Diagnosis not present

## 2023-03-20 DIAGNOSIS — M5431 Sciatica, right side: Secondary | ICD-10-CM | POA: Diagnosis not present

## 2023-03-20 DIAGNOSIS — M963 Postlaminectomy kyphosis: Secondary | ICD-10-CM | POA: Diagnosis not present

## 2023-03-20 DIAGNOSIS — M41124 Adolescent idiopathic scoliosis, thoracic region: Secondary | ICD-10-CM | POA: Diagnosis not present

## 2023-03-20 DIAGNOSIS — M4156 Other secondary scoliosis, lumbar region: Secondary | ICD-10-CM | POA: Diagnosis not present

## 2023-04-18 ENCOUNTER — Emergency Department (HOSPITAL_COMMUNITY)
Admission: EM | Admit: 2023-04-18 | Discharge: 2023-04-19 | Disposition: A | Discharge status: Home / Self Care | Payer: Medicare HMO | Attending: Emergency Medicine | Admitting: Emergency Medicine

## 2023-04-18 ENCOUNTER — Emergency Department (HOSPITAL_COMMUNITY): Payer: Medicare HMO

## 2023-04-18 ENCOUNTER — Other Ambulatory Visit: Payer: Self-pay

## 2023-04-18 ENCOUNTER — Encounter (HOSPITAL_COMMUNITY): Payer: Self-pay

## 2023-04-18 DIAGNOSIS — E876 Hypokalemia: Secondary | ICD-10-CM | POA: Diagnosis not present

## 2023-04-18 DIAGNOSIS — Z7982 Long term (current) use of aspirin: Secondary | ICD-10-CM | POA: Diagnosis not present

## 2023-04-18 DIAGNOSIS — R55 Syncope and collapse: Secondary | ICD-10-CM | POA: Diagnosis not present

## 2023-04-18 DIAGNOSIS — I6782 Cerebral ischemia: Secondary | ICD-10-CM | POA: Diagnosis not present

## 2023-04-18 DIAGNOSIS — Z79899 Other long term (current) drug therapy: Secondary | ICD-10-CM | POA: Insufficient documentation

## 2023-04-18 DIAGNOSIS — R531 Weakness: Secondary | ICD-10-CM | POA: Diagnosis not present

## 2023-04-18 DIAGNOSIS — Z20822 Contact with and (suspected) exposure to covid-19: Secondary | ICD-10-CM | POA: Insufficient documentation

## 2023-04-18 DIAGNOSIS — I10 Essential (primary) hypertension: Secondary | ICD-10-CM | POA: Diagnosis not present

## 2023-04-18 DIAGNOSIS — R42 Dizziness and giddiness: Secondary | ICD-10-CM | POA: Diagnosis not present

## 2023-04-18 LAB — CBC WITH DIFFERENTIAL/PLATELET
Abs Immature Granulocytes: 0.02 10*3/uL (ref 0.00–0.07)
Basophils Absolute: 0.1 10*3/uL (ref 0.0–0.1)
Basophils Relative: 1 %
Eosinophils Absolute: 0.2 10*3/uL (ref 0.0–0.5)
Eosinophils Relative: 3 %
HCT: 39.5 % (ref 36.0–46.0)
Hemoglobin: 12.9 g/dL (ref 12.0–15.0)
Immature Granulocytes: 0 %
Lymphocytes Relative: 17 %
Lymphs Abs: 1.4 10*3/uL (ref 0.7–4.0)
MCH: 30.5 pg (ref 26.0–34.0)
MCHC: 32.7 g/dL (ref 30.0–36.0)
MCV: 93.4 fL (ref 80.0–100.0)
Monocytes Absolute: 0.6 10*3/uL (ref 0.1–1.0)
Monocytes Relative: 8 %
Neutro Abs: 5.8 10*3/uL (ref 1.7–7.7)
Neutrophils Relative %: 71 %
Platelets: 164 10*3/uL (ref 150–400)
RBC: 4.23 MIL/uL (ref 3.87–5.11)
RDW: 13.1 % (ref 11.5–15.5)
WBC: 8.1 10*3/uL (ref 4.0–10.5)
nRBC: 0 % (ref 0.0–0.2)

## 2023-04-18 LAB — COMPREHENSIVE METABOLIC PANEL
ALT: 14 U/L (ref 0–44)
AST: 21 U/L (ref 15–41)
Albumin: 3.6 g/dL (ref 3.5–5.0)
Alkaline Phosphatase: 62 U/L (ref 38–126)
Anion gap: 9 (ref 5–15)
BUN: 16 mg/dL (ref 8–23)
CO2: 25 mmol/L (ref 22–32)
Calcium: 8.7 mg/dL — ABNORMAL LOW (ref 8.9–10.3)
Chloride: 105 mmol/L (ref 98–111)
Creatinine, Ser: 1.18 mg/dL — ABNORMAL HIGH (ref 0.44–1.00)
GFR, Estimated: 48 mL/min — ABNORMAL LOW (ref >60)
Glucose, Bld: 117 mg/dL — ABNORMAL HIGH (ref 70–99)
Potassium: 3.1 mmol/L — ABNORMAL LOW (ref 3.5–5.1)
Sodium: 139 mmol/L (ref 135–145)
Total Bilirubin: 0.3 mg/dL (ref <1.2)
Total Protein: 6.1 g/dL — ABNORMAL LOW (ref 6.5–8.1)

## 2023-04-18 LAB — RESP PANEL BY RT-PCR (RSV, FLU A&B, COVID)  RVPGX2
Influenza A by PCR: NEGATIVE
Influenza B by PCR: NEGATIVE
Resp Syncytial Virus by PCR: NEGATIVE
SARS Coronavirus 2 by RT PCR: NEGATIVE

## 2023-04-18 LAB — TROPONIN I (HIGH SENSITIVITY): Troponin I (High Sensitivity): 7 ng/L (ref <18)

## 2023-04-18 MED ORDER — SODIUM CHLORIDE 0.9 % IV BOLUS
1000.0000 mL | Freq: Once | INTRAVENOUS | Status: AC
  Administered 2023-04-19: 1000 mL via INTRAVENOUS

## 2023-04-18 MED ORDER — POTASSIUM CHLORIDE CRYS ER 20 MEQ PO TBCR
40.0000 meq | EXTENDED_RELEASE_TABLET | Freq: Once | ORAL | Status: AC
  Administered 2023-04-19: 40 meq via ORAL
  Filled 2023-04-18: qty 2

## 2023-04-18 MED ORDER — SODIUM CHLORIDE 0.9 % IV BOLUS
1000.0000 mL | Freq: Once | INTRAVENOUS | Status: AC
  Administered 2023-04-18: 1000 mL via INTRAVENOUS

## 2023-04-18 MED ORDER — POTASSIUM CHLORIDE 10 MEQ/100ML IV SOLN
10.0000 meq | Freq: Once | INTRAVENOUS | Status: AC
  Administered 2023-04-19: 10 meq via INTRAVENOUS
  Filled 2023-04-18: qty 100

## 2023-04-18 NOTE — ED Notes (Signed)
Patient taken to xray at this time.

## 2023-04-18 NOTE — ED Triage Notes (Signed)
Pt to ED BIB EMS from home with complaint of acute onset of dizziness and generalized weakness - near syncope. Sitting down when episode occurred. Initial BP systolic 90 upon EMS arrival to scene. Received NS en route. Symptoms resolved upon arrival to ED.

## 2023-04-18 NOTE — ED Provider Triage Note (Signed)
Emergency Medicine Provider Triage Evaluation Note  [name] , a 77 y.o. female  was evaluated in triage.  Pt complains of becoming dizzy and weak and almost passing out.  Patient states that she felt like she she was blacking out.  Dizziness has improved now  Review of Systems  Positive: Dizziness. Negative: Chest pain  Physical Exam  BP 137/60 (BP Location: Left Arm)   Pulse 72   Temp 97.6 F (36.4 C) (Oral)   Resp 16   LMP  (LMP Unknown)   SpO2 99%  Gen:   Awake, no distress mild Resp:  Normal effort normal MSK:   Moves extremities without difficulty  Other:    Medical Decision Making  Medically screening exam initiated at 10:42 PM.  Appropriate orders placed.  [name] was informed that the remainder of the evaluation will be completed by another provider, this initial triage assessment does not replace that evaluation, and the importance of remaining in the ED until their evaluation is complete.     Bethann Berkshire, MD 04/21/23 [phone]

## 2023-04-18 NOTE — ED Provider Notes (Signed)
[place] EMERGENCY DEPARTMENT AT [facility] Provider Note   CSN: [id number] Arrival date & time: 04/18/23  2222     History {Add pertinent medical, surgical, social history, OB history to HPI:1} Chief Complaint  Patient presents with   Near Syncope    [name] is a 77 y.o. female.  Patient has a history of hypertension and GERD and has had a stroke before with no residual problem.  Patient was sitting down today and started to feel dizzy and weak like she was going to pass out.  When paramedics arrived her blood pressure was in the 90s.  Patient is still feeling weak but not dizzy now  The history is provided by the patient and medical records. No language interpreter was used.  Near Syncope This is a new problem. The current episode started 3 to 5 hours ago. The problem occurs rarely. The problem has been resolved. Pertinent negatives include no chest pain, no abdominal pain and no headaches. Nothing aggravates the symptoms. Nothing relieves the symptoms.       Home Medications Prior to Admission medications   Medication Sig Start Date End Date Taking? Authorizing Provider  amLODipine (NORVASC) 5 MG tablet Take 1 tablet (5 mg total) by mouth daily. 07/30/22   Baldo Daub, MD  aspirin EC 81 MG tablet Take 81 mg by mouth daily. Swallow whole.    [provider]  cholecalciferol (VITAMIN D3) 25 MCG (1000 UNIT) tablet Take 1,000 Units by mouth daily. Patient takes 2000 units    [provider]  citalopram (CELEXA) 20 MG tablet Take 1 tablet (20 mg total) by mouth every morning. 12/18/22   Olive Bass, FNP  Cyanocobalamin (VITAMIN B-12) 5000 MCG TBDP Take by mouth.    [provider]  Evolocumab (REPATHA SURECLICK) 140 MG/ML SOAJ Inject 140 mg into the skin every 14 (fourteen) days. 11/18/22   Baldo Daub, MD  furosemide (LASIX) 40 MG tablet Take 1 tablet (40 mg total) by mouth every morning. 12/18/22   Olive Bass, FNP  isosorbide mononitrate (IMDUR) 30 MG 24 hr tablet TAKE ONE TABLET BY MOUTH EVERY MORNING 12/18/22   Baldo Daub, MD  meloxicam (MOBIC) 15 MG tablet Take 1 tablet (15 mg total) by mouth daily. Patient taking differently: Take 15 mg by mouth daily. AS NEEDED FOR SPASMS 09/12/22   Clayborne Dana, NP  omeprazole (PRILOSEC) 40 MG capsule TAKE ONE CAPSULE BY MOUTH ONCE DAILY 10/22/22   Iva Boop, MD  pantoprazole (PROTONIX) 40 MG tablet Take 1 tablet (40 mg total) by mouth 2 (two) times daily before a meal. Patient taking differently: Take 40 mg by mouth daily. 09/18/22   Esterwood, Amy S, PA-C      Allergies    Other, Silodosin, Ace inhibitors, Buprenorphine hcl, Morphine and codeine, Codeine, Sulfonamide derivatives, and Telmisartan    Review of Systems   Review of Systems  Constitutional:  Negative for appetite change and fatigue.  HENT:  Negative for congestion, ear discharge and sinus pressure.   Eyes:  Negative for discharge.  Respiratory:  Negative for cough.   Cardiovascular:  Positive for near-syncope. Negative for chest pain.  Gastrointestinal:  Negative for abdominal pain and diarrhea.  Genitourinary:  Negative for frequency and hematuria.  Musculoskeletal:  Negative for back pain.  Skin:  Negative for rash.  Neurological:  Positive for dizziness. Negative for seizures and headaches.  Psychiatric/Behavioral:  Negative for hallucinations.  Physical Exam Updated Vital Signs BP 137/60 (BP Location: Left Arm)   Pulse 72   Temp 97.6 F (36.4 C) (Oral)   Resp 16   LMP  (LMP Unknown)   SpO2 99%  Physical Exam Vitals and nursing note reviewed.  Constitutional:      Appearance: She is well-developed.  HENT:     Head: Normocephalic.     Mouth/Throat:     Mouth: Mucous membranes are moist.  Eyes:     General: No scleral icterus.    Conjunctiva/sclera: Conjunctivae normal.  Neck:     Thyroid: No thyromegaly.  Cardiovascular:     Rate and Rhythm:  Normal rate and regular rhythm.     Heart sounds: No murmur heard.    No friction rub. No gallop.  Pulmonary:     Breath sounds: No stridor. No wheezing or rales.  Chest:     Chest wall: No tenderness.  Abdominal:     General: There is no distension.     Tenderness: There is no abdominal tenderness. There is no rebound.  Musculoskeletal:        General: Normal range of motion.     Cervical back: Neck supple.  Lymphadenopathy:     Cervical: No cervical adenopathy.  Skin:    Findings: No erythema or rash.  Neurological:     Mental Status: She is alert and oriented to person, place, and time.     Motor: No abnormal muscle tone.     Coordination: Coordination normal.  Psychiatric:        Behavior: Behavior normal.     ED Results / Procedures / Treatments   Labs (all labs ordered are listed, but only abnormal results are displayed) Labs Reviewed  RESP PANEL BY RT-PCR (RSV, FLU A&B, COVID)  [id number]  CBC WITH DIFFERENTIAL/PLATELET  COMPREHENSIVE METABOLIC PANEL  URINALYSIS, ROUTINE W REFLEX MICROSCOPIC  TROPONIN I (HIGH SENSITIVITY)    EKG None  Radiology DG Chest Port 1 View  Result Date: 04/18/2023 CLINICAL DATA:  Weakness dizziness EXAM: PORTABLE CHEST 1 VIEW COMPARISON:  09/30/2022 FINDINGS: Clips over the lower chest. No acute airspace disease, pleural effusion or pneumothorax. Stable cardiomediastinal silhouette. IMPRESSION: No active disease. Electronically Signed   By: Jasmine Pang M.D.   On: 04/18/2023 23:06    Procedures Procedures  {Document cardiac monitor, telemetry assessment procedure when appropriate:1}  Medications Ordered in ED Medications  sodium chloride 0.9 % bolus 1,000 mL (has no administration in time range)  sodium chloride 0.9 % bolus 1,000 mL (1,000 mLs Intravenous New Bag/Given 04/18/23 2251)    ED Course/ Medical Decision Making/ A&P   {   Click here for ABCD2, HEART and other calculatorsREFRESH Note before signing :1}                               Medical Decision Making Amount and/or Complexity of Data Reviewed Labs: ordered. Radiology: ordered.   Patient with near syncopal episode has an initial hypotension.  {Document critical care time when appropriate:1} {Document review of labs and clinical decision tools ie heart score, Chads2Vasc2 etc:1}  {Document your independent review of radiology images, and any outside records:1} {Document your discussion with family members, caretakers, and with consultants:1} {Document social determinants of health affecting pt's care:1} {Document your decision making why or why not admission, treatments were needed:1} Final Clinical Impression(s) / ED Diagnoses Final diagnoses:  None    Rx / DC Orders  ED Discharge Orders     None

## 2023-04-19 LAB — URINALYSIS, ROUTINE W REFLEX MICROSCOPIC
Bilirubin Urine: NEGATIVE
Glucose, UA: NEGATIVE mg/dL
Hgb urine dipstick: NEGATIVE
Ketones, ur: NEGATIVE mg/dL
Nitrite: NEGATIVE
Protein, ur: NEGATIVE mg/dL
Specific Gravity, Urine: 1.004 — ABNORMAL LOW (ref 1.005–1.030)
pH: 7 (ref 5.0–8.0)

## 2023-04-19 LAB — TROPONIN I (HIGH SENSITIVITY): Troponin I (High Sensitivity): 4 ng/L (ref <18)

## 2023-04-19 LAB — D-DIMER, QUANTITATIVE: D-Dimer, Quant: 0.27 ug{FEU}/mL (ref 0.00–0.50)

## 2023-04-19 NOTE — Discharge Instructions (Signed)
Please drink plenty of hydrating fluids and follow closely with your PCP in the coming week. Return with any new or suddenly worsening symptoms.

## 2023-04-19 NOTE — ED Provider Notes (Signed)
Blood pressure 137/60, pulse 72, temperature 97.6 F (36.4 C), temperature source Oral, resp. rate 16, SpO2 99%.  Assuming care from Dr. Estell Harpin.  In short, [name] is a 77 y.o. female with a chief complaint of Near Syncope .  Refer to the original H&P for additional details.  The current plan of care is to follow up on d dimer and second troponin.  02:08 AM  Patient's second troponin and d dimer are WNL. No acute findings. Patient feeling much better and requesting d/c home with plan for close PCP follow up. Discussed strict ED return precautions.    Maia Plan, MD 04/19/23 [phone]

## 2023-04-21 ENCOUNTER — Telehealth: Payer: Self-pay | Admitting: *Deleted

## 2023-04-21 NOTE — Transitions of Care (Post Inpatient/ED Visit) (Signed)
   04/21/2023  Name: KELCY ALLREAD MRN: [id number] DOB: 21-Nov-1945  Today's TOC FU Call Status: Today's TOC FU Call Status:: Unsuccessful Call (1st Attempt) Unsuccessful Call (1st Attempt) Date: 04/21/23  Attempted to reach the patient regarding the most recent Inpatient/ED visit.  Follow Up Plan: Additional outreach attempts will be made to reach the patient to complete the Transitions of Care (Post Inpatient/ED visit) call.   Signature Rayven Rettig, [facility]

## 2023-04-22 ENCOUNTER — Telehealth: Payer: Self-pay | Admitting: *Deleted

## 2023-04-22 NOTE — Transitions of Care (Post Inpatient/ED Visit) (Signed)
   04/22/2023  Name: [name] MRN: [id number] DOB: 05/06/1946  Today's TOC FU Call Status: Today's TOC FU Call Status:: Unsuccessful Call (2nd Attempt) Unsuccessful Call (2nd Attempt) Date: 04/22/23  Attempted to reach the patient regarding the most recent Inpatient/ED visit.  Follow Up Plan: Additional outreach attempts will be made to reach the patient to complete the Transitions of Care (Post Inpatient/ED visit) call.   Signature Yvan Dority, [facility]

## 2023-04-29 ENCOUNTER — Ambulatory Visit (INDEPENDENT_AMBULATORY_CARE_PROVIDER_SITE_OTHER): Payer: Medicare HMO | Admitting: Family

## 2023-04-29 ENCOUNTER — Encounter: Payer: Self-pay | Admitting: Family

## 2023-04-29 VITALS — BP 134/86 | HR 76 | Ht 65.0 in | Wt 189.6 lb

## 2023-04-29 DIAGNOSIS — M4156 Other secondary scoliosis, lumbar region: Secondary | ICD-10-CM | POA: Diagnosis not present

## 2023-04-29 DIAGNOSIS — E876 Hypokalemia: Secondary | ICD-10-CM

## 2023-04-29 DIAGNOSIS — M5431 Sciatica, right side: Secondary | ICD-10-CM | POA: Diagnosis not present

## 2023-04-29 DIAGNOSIS — R42 Dizziness and giddiness: Secondary | ICD-10-CM | POA: Diagnosis not present

## 2023-04-29 DIAGNOSIS — M41124 Adolescent idiopathic scoliosis, thoracic region: Secondary | ICD-10-CM | POA: Diagnosis not present

## 2023-04-29 DIAGNOSIS — M5432 Sciatica, left side: Secondary | ICD-10-CM | POA: Diagnosis not present

## 2023-04-29 NOTE — Progress Notes (Signed)
[name] is a 77 y.o. female with the following history as recorded in EpicCare:  Patient Active Problem List   Diagnosis Date Noted   Arthritis of left knee 12/26/2021   Allergy 10/17/2021   Lichen sclerosus 12/06/2020   Dizziness 10/27/2020   Vitamin B12 deficiency    Mild anxiety    Kidney stone    History of hiatal hernia    History of chicken pox    GERD (gastroesophageal reflux disease)    Gastric polyps    Dyspnea    Depression    Complication of anesthesia    Chronic back pain    Blood dyscrasia    Arthritis    Ankle effusion, left 03/23/2020   Rheumatoid arthritis (HCC) 11/02/2018   Episode of recurrent major depressive disorder (HCC) 12/05/2017   Primary osteoarthritis of both hands 07/09/2017   Primary osteoarthritis of both knees 07/09/2017   DDD (degenerative disc disease), cervical 07/09/2017   DDD (degenerative disc disease), lumbar 07/09/2017   Peripheral edema 11/26/2016   Medicare annual wellness visit, subsequent 06/30/2016   Hx of adenomatous polyp of colon 07/03/2015   Viral URI with cough 09/11/2014   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   Breast cancer, left breast (HCC) 07/28/2014   Family history of breast cancer    Family history of colon cancer    Family history of pancreatic cancer    Malignant neoplasm of upper-outer quadrant of left breast in female, 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   Epigastric pain 02/02/2014   Peripheral neuropathy 01/01/2013   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   S/P laparoscopic fundoplication 08/09/2011   Steatosis of liver 12/07/2009   COAGULOPATHY 07/12/2008   Melanoma (HCC) 2010   CHEST XRAY, ABNORMAL 01/04/2008   Chronic cough 12/07/2007   Vitamin D deficiency 04/14/2007   VITAMIN B12 DEFICIENCY 02/27/2007   Essential hypertension 02/27/2007   GERD 02/27/2007   Fibromyalgia 02/27/2007   Breast cancer, right breast (HCC) 1995    Current Outpatient Medications  Medication Sig  Dispense Refill   amLODipine (NORVASC) 5 MG tablet Take 1 tablet (5 mg total) by mouth daily. 90 tablet 2   aspirin EC 81 MG tablet Take 81 mg by mouth daily. Swallow whole.     cholecalciferol (VITAMIN D3) 25 MCG (1000 UNIT) tablet Take 1,000 Units by mouth daily. Patient takes 2000 units     citalopram (CELEXA) 20 MG tablet Take 1 tablet (20 mg total) by mouth every morning. 90 tablet 0   Cyanocobalamin (VITAMIN B-12) 5000 MCG TBDP Take by mouth.     Evolocumab (REPATHA SURECLICK) 140 MG/ML SOAJ Inject 140 mg into the skin every 14 (fourteen) days. 6 mL 1   furosemide (LASIX) 40 MG tablet Take 1 tablet (40 mg total) by mouth every morning. 90 tablet 0   isosorbide mononitrate (IMDUR) 30 MG 24 hr tablet TAKE ONE TABLET BY MOUTH EVERY MORNING 90 tablet 0   meloxicam (MOBIC) 15 MG tablet Take 1 tablet (15 mg total) by mouth daily. (Patient taking differently: Take 15 mg by mouth daily. AS NEEDED FOR SPASMS) 30 tablet 0   omeprazole (PRILOSEC) 40 MG capsule TAKE ONE CAPSULE BY MOUTH ONCE DAILY 90 capsule 3   No current facility-administered medications for this visit.    Allergies: Other, Silodosin, Ace inhibitors, Buprenorphine hcl, Morphine and codeine, Codeine, Sulfonamide derivatives, and Telmisartan  Past Medical History:  Diagnosis Date   Allergy    Ankle effusion, left 03/23/2020   Arthritis    "  spine" (07/28/2014)   Blood dyscrasia    bruises and bleed easily   Breast cancer, left breast (HCC) 07/28/2014   Breast cancer, right breast (HCC) 1995   CHEST XRAY, ABNORMAL 01/04/2008   Qualifier: Diagnosis of  By: Plotnikov MD, Georgina Quint    Chronic back pain    "all over"   Chronic cough 12/07/2007   Could be related to GERD - relapsed    COAGULOPATHY 07/12/2008   Qualifier: Diagnosis of  By: Koleen Distance CMA (AAMA), Leisha     Complication of anesthesia    went to sleep easily but hard to wake up up until elbow OR in 2010   CVA (cerebral vascular accident) (HCC)    2022   DDD  (degenerative disc disease), cervical 07/09/2017   DDD (degenerative disc disease), lumbar 07/09/2017   With spinal stenosis.   Dr. Dutch Quint   Depression    Dizziness 10/27/2020   Dyspnea    Normal Spirometry 03/2008 EF 65% BNP normal 11/2007   Episode of recurrent major depressive disorder (HCC) 12/05/2017   Essential hypertension 02/27/2007   Mild Off meds 2013  BP Readings from Last 3 Encounters:  02/10/12 128/90  08/09/11 134/62  03/06/11 150/86      Family history of breast cancer    Family history of colon cancer    Family history of pancreatic cancer    Fibromyalgia 02/27/2007   Chronic    Gastric polyps    GERD 02/27/2007   S/p Nissen's 2010    GERD (gastroesophageal reflux disease)    History of chicken pox    History of hiatal hernia    Hx of adenomatous polyp of colon 07/03/2015   Kidney stone    right kidney    Lichen sclerosus    Malignant neoplasm of upper-outer quadrant of left breast in female, 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   Medicare annual wellness visit, subsequent 06/30/2016   Melanoma (HCC) 2010   "right elbow; treated at [facility]"   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 ([facility])   Mild anxiety    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   Peripheral edema 11/26/2016   Peripheral neuropathy 01/01/2013   Primary osteoarthritis of both hands 07/09/2017   Primary osteoarthritis of both knees 07/09/2017   Chondromalacia patella   Rheumatoid arthritis (HCC) 11/02/2018   S/P laparoscopic fundoplication 08/09/2011   Steatosis of liver 12/07/2009   Qualifier: Diagnosis of  By: Surface RN, Donna     Viral URI with cough 09/11/2014   Vitamin B12 deficiency    VITAMIN B12 DEFICIENCY 02/27/2007   Chronic    Vitamin D deficiency     Past Surgical History:  Procedure Laterality Date   BREAST BIOPSY Left 04/2014   BREAST RECONSTRUCTION WITH PLACEMENT OF TISSUE EXPANDER AND FLEX HD (ACELLULAR HYDRATED DERMIS) Left 07/28/2014    Procedure: LEFT BREAST RECONSTRUCTION PLACEMENT OF LEFT TISSUE EXPANDER ;  Surgeon: Etter Sjogren, MD;  Location: [facility] OR;  Service: Plastics;  Laterality: Left;   BREAST RECONSTRUCTION WITH PLACEMENT OF TISSUE EXPANDER AND FLEX HD (ACELLULAR HYDRATED DERMIS) Left 09/08/2014   Procedure: REMOVAL OF TISSUE EXPANDER FROM LEFT BREAST;  Surgeon: Etter Sjogren, MD;  Location: [facility] OR;  Service: Plastics;  Laterality: Left;   BUNIONECTOMY Bilateral 1970's   COLONOSCOPY     Dr Jarold Motto   CYSTOSCOPY WITH RETROGRADE PYELOGRAM, URETEROSCOPY AND STENT PLACEMENT Right 06/09/2015   Procedure: CYSTOSCOPY WITH RIGHT RETROGRADE PYELOGRAM, RIGHT URETEROSCOPY AND RIGHT URTERAL STENT  PLACEMENT;  Surgeon: Crist Fat, MD;  Location: [facility] ORS;  Service: Urology;  Laterality: Right;   ESOPHAGOGASTRODUODENOSCOPY     HERNIA REPAIR     HOLMIUM LASER APPLICATION Right 06/09/2015   Procedure: HOLMIUM LASER APPLICATION;  Surgeon: Crist Fat, MD;  Location: [facility] ORS;  Service: Urology;  Laterality: Right;   LATISSIMUS FLAP TO BREAST Left 09/08/2014   Procedure: LEFT LATISSIMUS FLAP TO BREAST WITH SALINE IMPLANT FOR BREAST RECONSTRUCTION;  Surgeon: Etter Sjogren, MD;  Location: [facility] OR;  Service: Plastics;  Laterality: Left;   LUMBAR FUSION  01/2018   L5-S1 transitional segmental laminectomy and Fusion   MASTECTOMY Right 1996    chemotherapy. pt. states 13 lymph nodes were removed   MASTECTOMY COMPLETE / SIMPLE W/ SENTINEL NODE BIOPSY Left 07/28/2014   MASTECTOMY W/ SENTINEL NODE BIOPSY Left 07/28/2014   Procedure: LEFT MASTECTOMY WITH SENTINEL LYMPH NODE MAPPING;  Surgeon: Chevis Pretty III, MD;  Location: [facility] OR;  Service: General;  Laterality: Left;   MELANOMA EXCISION Right 2010   From elbow-- Done at [facility]    NISSEN FUNDOPLICATION  09/2008   RECONSTRUCTION BREAST IMMEDIATE / DELAYED W/ TISSUE EXPANDER Left 07/28/2014   TEMPOROMANDIBULAR JOINT SURGERY Bilateral 1987   TONSILLECTOMY      Family History  Problem Relation Age of  Onset   Colon cancer Cousin 3       double first cousin   Colon cancer Cousin 1       double first cousin   Colon polyps Father        between 10-20   Dementia Father    CAD Father        CABG at age 37   Stroke Mother    Pancreatic cancer Brother 100   Colon polyps Brother        between 10-20   Cancer Paternal Uncle        NOS   Anemia Paternal Grandfather        pernicious anemia   Breast cancer Cousin 63       double first cousin   Breast cancer Cousin 76       paternal cousin   Stroke Other        F 1st degree relative 88, M 1st degree relative   Esophageal cancer Neg Hx    Stomach cancer Neg Hx     Social History   Tobacco Use   Smoking status: Never   Smokeless tobacco: Never   Tobacco comments:    Regular Exercise - Yes  Substance Use Topics   Alcohol use: No    Subjective:   Patient was seen at the ER on November 22 with symptoms of feeling dizzy/ felt like she was going to pass out; was taken to the ER- found to have low potassium/ mildly elevated creatinine; felt like "she was coming out of her body" right before the onset of the symptoms; Head CT was normal done at ER; Does take her Lasix daily; is not currently on a potassium supplement;  Normal MRI done in September 202 with similar symptoms- neurology exam was unremarkable in 2023;   Objective:  Vitals:   04/29/23 1430  BP: 134/86  Pulse: 76  SpO2: 98%  Weight: 189 lb 9.6 oz (86 kg)  Height: 5\' 5"  (1.651 m)    General: Well developed, well nourished, in no acute distress  Skin : Warm and dry.  Head: Normocephalic and atraumatic  Eyes: Sclera and conjunctiva clear; pupils round  and reactive to light; extraocular movements intact  Ears: External normal; canals clear; tympanic membranes normal  Oropharynx: Pink, supple. No suspicious lesions  Neck: Supple without thyromegaly, adenopathy  Lungs: Respirations unlabored; clear to auscultation bilaterally without wheeze, rales, rhonchi  CVS exam:  normal rate and regular rhythm.  Neurologic: Alert and oriented; speech intact; face symmetrical; moves all extremities well; CNII-XII intact without focal deficit   Assessment:  1. Dizziness   2. Hypokalemia     Plan:  Will repeat bran MRI and bilateral carotid dopplers; will also repeat labs today- will most likely need to start patient on daily potassium supplement since she is taking Lasix daily; follow up to be determined;   Time spent reviewing recent ER notes/ previous tests and discussing treatment plan  No follow-ups on file.  Orders Placed This Encounter  Procedures   MR Brain Wo Contrast    Standing Status:   Future    Standing Expiration Date:   04/28/2024    Order Specific Question:   What is the patient's sedation requirement?    Answer:   No Sedation    Order Specific Question:   Does the patient have a pacemaker or implanted devices?    Answer:   No    Order Specific Question:   Preferred imaging location?    Answer:   [facility] (table limit 350lbs)   CBC with Differential/Platelet   Comp Met (CMET)    Requested Prescriptions    No prescriptions requested or ordered in this encounter

## 2023-04-30 LAB — CBC WITH DIFFERENTIAL/PLATELET
Basophils Absolute: 0.1 10*3/uL (ref 0.0–0.1)
Basophils Relative: 1 % (ref 0.0–3.0)
Eosinophils Absolute: 0.2 10*3/uL (ref 0.0–0.7)
Eosinophils Relative: 3.4 % (ref 0.0–5.0)
HCT: 42.9 % (ref 36.0–46.0)
Hemoglobin: 14.3 g/dL (ref 12.0–15.0)
Lymphocytes Relative: 25.7 % (ref 12.0–46.0)
Lymphs Abs: 1.8 10*3/uL (ref 0.7–4.0)
MCHC: 33.4 g/dL (ref 30.0–36.0)
MCV: 93.2 fL (ref 78.0–100.0)
Monocytes Absolute: 0.6 10*3/uL (ref 0.1–1.0)
Monocytes Relative: 8.9 % (ref 3.0–12.0)
Neutro Abs: 4.2 10*3/uL (ref 1.4–7.7)
Neutrophils Relative %: 61 % (ref 43.0–77.0)
Platelets: 202 10*3/uL (ref 150.0–400.0)
RBC: 4.6 Mil/uL (ref 3.87–5.11)
RDW: 13.7 % (ref 11.5–15.5)
WBC: 6.9 10*3/uL (ref 4.0–10.5)

## 2023-04-30 LAB — COMPREHENSIVE METABOLIC PANEL
ALT: 13 U/L (ref 0–35)
AST: 18 U/L (ref 0–37)
Albumin: 4.3 g/dL (ref 3.5–5.2)
Alkaline Phosphatase: 65 U/L (ref 39–117)
BUN: 15 mg/dL (ref 6–23)
CO2: 33 meq/L — ABNORMAL HIGH (ref 19–32)
Calcium: 9.1 mg/dL (ref 8.4–10.5)
Chloride: 102 meq/L (ref 96–112)
Creatinine, Ser: 0.85 mg/dL (ref 0.40–1.20)
GFR: 66.16 mL/min (ref >60.00)
Glucose, Bld: 80 mg/dL (ref 70–99)
Potassium: 3.7 meq/L (ref 3.5–5.1)
Sodium: 142 meq/L (ref 135–145)
Total Bilirubin: 0.5 mg/dL (ref 0.2–1.2)
Total Protein: 6.7 g/dL (ref 6.0–8.3)

## 2023-05-05 ENCOUNTER — Encounter: Payer: Self-pay | Admitting: Family

## 2023-05-07 DIAGNOSIS — M48062 Spinal stenosis, lumbar region with neurogenic claudication: Secondary | ICD-10-CM | POA: Diagnosis not present

## 2023-05-16 ENCOUNTER — Ambulatory Visit (HOSPITAL_COMMUNITY)
Admission: RE | Admit: 2023-05-16 | Discharge: 2023-05-16 | Disposition: A | Discharge status: Home / Self Care | Payer: Medicare HMO | Source: Ambulatory Visit | Attending: Cardiovascular Disease | Admitting: Cardiovascular Disease

## 2023-05-16 DIAGNOSIS — R42 Dizziness and giddiness: Secondary | ICD-10-CM | POA: Insufficient documentation

## 2023-05-18 ENCOUNTER — Ambulatory Visit (HOSPITAL_BASED_OUTPATIENT_CLINIC_OR_DEPARTMENT_OTHER)
Admission: RE | Admit: 2023-05-18 | Discharge: 2023-05-18 | Disposition: A | Discharge status: Home / Self Care | Payer: Medicare HMO | Source: Ambulatory Visit | Attending: Family | Admitting: Family

## 2023-05-18 DIAGNOSIS — R42 Dizziness and giddiness: Secondary | ICD-10-CM | POA: Diagnosis not present

## 2023-05-18 DIAGNOSIS — R410 Disorientation, unspecified: Secondary | ICD-10-CM | POA: Diagnosis not present

## 2023-05-18 DIAGNOSIS — I6782 Cerebral ischemia: Secondary | ICD-10-CM | POA: Diagnosis not present

## 2023-05-18 DIAGNOSIS — R55 Syncope and collapse: Secondary | ICD-10-CM | POA: Diagnosis not present

## 2023-05-19 ENCOUNTER — Other Ambulatory Visit: Payer: Self-pay | Admitting: Family

## 2023-05-19 DIAGNOSIS — R42 Dizziness and giddiness: Secondary | ICD-10-CM

## 2023-05-19 DIAGNOSIS — R9089 Other abnormal findings on diagnostic imaging of central nervous system: Secondary | ICD-10-CM

## 2023-05-22 DIAGNOSIS — M963 Postlaminectomy kyphosis: Secondary | ICD-10-CM | POA: Diagnosis not present

## 2023-05-22 DIAGNOSIS — M4156 Other secondary scoliosis, lumbar region: Secondary | ICD-10-CM | POA: Diagnosis not present

## 2023-05-22 DIAGNOSIS — M41124 Adolescent idiopathic scoliosis, thoracic region: Secondary | ICD-10-CM | POA: Diagnosis not present

## 2023-05-30 ENCOUNTER — Ambulatory Visit: Payer: Self-pay | Admitting: Family

## 2023-05-30 ENCOUNTER — Other Ambulatory Visit: Payer: Self-pay | Admitting: Orthopedic Surgery

## 2023-05-30 DIAGNOSIS — M48062 Spinal stenosis, lumbar region with neurogenic claudication: Secondary | ICD-10-CM

## 2023-05-30 DIAGNOSIS — M41124 Adolescent idiopathic scoliosis, thoracic region: Secondary | ICD-10-CM

## 2023-05-30 DIAGNOSIS — M5432 Sciatica, left side: Secondary | ICD-10-CM

## 2023-05-30 NOTE — Telephone Encounter (Signed)
 Noted.

## 2023-05-30 NOTE — Telephone Encounter (Signed)
 Pt requesting abx Chief Complaint: uri s/s Symptoms: runny nose, productive cough,  Frequency: 1+ week Pertinent Negatives: Patient denies fever, denies SOB, denies CP,   Disposition: [] ED /[] Urgent Care (no appt availability in office) / [] Appointment(In office/virtual)/ []  [facility]/ [x] Home Care/ [] Refused Recommended Disposition /[] [place] [facility]/ []  Follow-up with PCP  Additional Notes: Pt using OTC mucinex as well as cough drops. Pt advised to increase fluids, advised to use honey to help with throat. Advised to go to the ED this weekend should she develop fever. Pt agreeable.   Copied from CRM [id number]. Topic: Clinical - Medical Advice >> May 30, 2023 11:03 AM Adaysia C wrote: Reason for CRM: Patient is experiencing sinus infection like symptoms such as coughing up a lot greenish/yellowish mucus with faint trace of blood in it. The patient would like to speak with the provider or a nurse about what medications she could take to help with the symptoms please follow up with patient [phone]. Reason for Disposition  Cough  Answer Assessment - Initial Assessment Questions 1. ONSET: When did the cough begin?      [place] a week ago 2. SEVERITY: How bad is the cough today?      Mild, worse with lying 3. SPUTUM: Describe the color of your sputum (none, dry cough; clear, white, yellow, green)     Yellow/green 4. HEMOPTYSIS: Are you coughing up any blood? If so ask: How much? (flecks, streaks, tablespoons, etc.)     denies 5. DIFFICULTY BREATHING: Are you having difficulty breathing? If Yes, ask: How bad is it? (e.g., mild, moderate, severe)    - MILD: No SOB at rest, mild SOB with walking, speaks normally in sentences, can lie down, no retractions, pulse < 100.    - MODERATE: SOB at rest, SOB with minimal exertion and prefers to sit, cannot lie down flat, speaks in phrases, mild retractions, audible wheezing, pulse 100-120.    - SEVERE: Very SOB at  rest, speaks in single words, struggling to breathe, sitting hunched forward, retractions, pulse > 120      denies 6. FEVER: Do you have a fever? If Yes, ask: What is your temperature, how was it measured, and when did it start?     denies 7. CARDIAC HISTORY: Do you have any history of heart disease? (e.g., heart attack, congestive heart failure)      denies 8. LUNG HISTORY: Do you have any history of lung disease?  (e.g., pulmonary embolus, asthma, emphysema)     denies 9. PE RISK FACTORS: Do you have a history of blood clots? (or: recent major surgery, recent prolonged travel, bedridden)     denies 10. OTHER SYMPTOMS: Do you have any other symptoms? (e.g., runny nose, wheezing, chest pain)       + runny nose, denies CP 12. TRAVEL: Have you traveled out of the country in the last month? (e.g., travel history, exposures)       denies  Protocols used: Cough - Acute Productive-A-AH

## 2023-06-01 ENCOUNTER — Other Ambulatory Visit (HOSPITAL_BASED_OUTPATIENT_CLINIC_OR_DEPARTMENT_OTHER): Payer: Medicare HMO

## 2023-06-03 ENCOUNTER — Encounter: Payer: Self-pay | Admitting: Orthopedic Surgery

## 2023-06-05 ENCOUNTER — Ambulatory Visit
Admission: RE | Admit: 2023-06-05 | Discharge: 2023-06-05 | Disposition: A | Discharge status: Home / Self Care | Payer: Medicare HMO | Source: Ambulatory Visit | Attending: Orthopedic Surgery | Admitting: Orthopedic Surgery

## 2023-06-05 DIAGNOSIS — M5134 Other intervertebral disc degeneration, thoracic region: Secondary | ICD-10-CM | POA: Diagnosis not present

## 2023-06-05 DIAGNOSIS — M5431 Sciatica, right side: Secondary | ICD-10-CM

## 2023-06-05 DIAGNOSIS — M48062 Spinal stenosis, lumbar region with neurogenic claudication: Secondary | ICD-10-CM

## 2023-06-05 DIAGNOSIS — M41124 Adolescent idiopathic scoliosis, thoracic region: Secondary | ICD-10-CM

## 2023-06-07 ENCOUNTER — Other Ambulatory Visit: Payer: Self-pay | Admitting: Cardiology

## 2023-06-07 DIAGNOSIS — I25118 Atherosclerotic heart disease of native coronary artery with other forms of angina pectoris: Secondary | ICD-10-CM

## 2023-06-13 ENCOUNTER — Encounter: Payer: Self-pay | Admitting: Vascular Surgery

## 2023-06-13 ENCOUNTER — Encounter: Payer: Self-pay | Admitting: Orthopedic Surgery

## 2023-06-13 ENCOUNTER — Other Ambulatory Visit: Payer: Self-pay | Admitting: Orthopedic Surgery

## 2023-06-13 ENCOUNTER — Ambulatory Visit
Admission: RE | Admit: 2023-06-13 | Discharge: 2023-06-13 | Disposition: A | Discharge status: Home / Self Care | Payer: Medicare HMO | Source: Ambulatory Visit | Attending: Orthopedic Surgery | Admitting: Orthopedic Surgery

## 2023-06-13 ENCOUNTER — Ambulatory Visit: Payer: Medicare HMO | Admitting: Vascular Surgery

## 2023-06-13 VITALS — BP 149/90 | HR 77 | Temp 97.9°F | Resp 20 | Ht 65.0 in | Wt 187.5 lb

## 2023-06-13 DIAGNOSIS — M48061 Spinal stenosis, lumbar region without neurogenic claudication: Secondary | ICD-10-CM | POA: Diagnosis not present

## 2023-06-13 DIAGNOSIS — M963 Postlaminectomy kyphosis: Secondary | ICD-10-CM

## 2023-06-13 DIAGNOSIS — M48062 Spinal stenosis, lumbar region with neurogenic claudication: Secondary | ICD-10-CM

## 2023-06-13 DIAGNOSIS — G629 Polyneuropathy, unspecified: Secondary | ICD-10-CM | POA: Diagnosis not present

## 2023-06-13 DIAGNOSIS — I6523 Occlusion and stenosis of bilateral carotid arteries: Secondary | ICD-10-CM | POA: Diagnosis not present

## 2023-06-13 DIAGNOSIS — M41124 Adolescent idiopathic scoliosis, thoracic region: Secondary | ICD-10-CM

## 2023-06-13 DIAGNOSIS — M5431 Sciatica, right side: Secondary | ICD-10-CM

## 2023-06-13 NOTE — Progress Notes (Signed)
Patient ID: [name], female   DOB: Nov 08, 1945, 78 y.o.   MRN: [id number]  Reason for Consult: New Patient (Initial Visit)   Referred by Olive Bass,*  Subjective:     HPI  [name] is a 78 y.o. female presenting for evaluation of carotid stenosis.  A few months ago she had an episode of syncope while sitting at the kitchen table doing paperwork.  She presented to the emergency department via EMS and had a full cardiac workup along with CT scans which were negative.  She has since been doing well.  She denies any previous strokelike symptoms, specifically she denies one-sided weakness, numbness, amaurosis or speech issues.  She is a never smoker. She also reports some burning tingling pain that travels down her legs.  She denies pain in the feet or toes.  She denies rest pain or ulceration.  Past Medical History:  Diagnosis Date   Allergy    Ankle effusion, left 03/23/2020   Arthritis    "spine" (07/28/2014)   Blood dyscrasia    bruises and bleed easily   Breast cancer, left breast (HCC) 07/28/2014   Breast cancer, right breast (HCC) 1995   CHEST XRAY, ABNORMAL 01/04/2008   Qualifier: Diagnosis of  By: Plotnikov MD, Georgina Quint    Chronic back pain    "all over"   Chronic cough 12/07/2007   Could be related to GERD - relapsed    COAGULOPATHY 07/12/2008   Qualifier: Diagnosis of  By: Koleen Distance CMA (AAMA), Leisha     Complication of anesthesia    went to sleep easily but hard to wake up up until elbow OR in 2010   CVA (cerebral vascular accident) (HCC)    2022   DDD (degenerative disc disease), cervical 07/09/2017   DDD (degenerative disc disease), lumbar 07/09/2017   With spinal stenosis.   Dr. Dutch Quint   Depression    Dizziness 10/27/2020   Dyspnea    Normal Spirometry 03/2008 EF 65% BNP normal 11/2007   Episode of recurrent major depressive disorder (HCC) 12/05/2017   Essential hypertension 02/27/2007   Mild Off meds 2013  BP Readings from Last 3 Encounters:   02/10/12 128/90  08/09/11 134/62  03/06/11 150/86      Family history of breast cancer    Family history of colon cancer    Family history of pancreatic cancer    Fibromyalgia 02/27/2007   Chronic    Gastric polyps    GERD 02/27/2007   S/p Nissen's 2010    GERD (gastroesophageal reflux disease)    History of chicken pox    History of hiatal hernia    Hx of adenomatous polyp of colon 07/03/2015   Kidney stone    right kidney    Lichen sclerosus    Malignant neoplasm of upper-outer quadrant of left breast in female, 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   Medicare annual wellness visit, subsequent 06/30/2016   Melanoma (HCC) 2010   "right elbow; [facility] at [facility]"   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 ([facility])   Mild anxiety    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   Peripheral edema 11/26/2016   Peripheral neuropathy 01/01/2013   Primary osteoarthritis of both hands 07/09/2017   Primary osteoarthritis of both knees 07/09/2017   Chondromalacia patella   Rheumatoid arthritis (HCC) 11/02/2018   S/P laparoscopic fundoplication 08/09/2011   Steatosis of liver 12/07/2009   Qualifier: Diagnosis of  By: Surface  RN, Lupita Leash     Viral URI with cough 09/11/2014   Vitamin B12 deficiency    VITAMIN B12 DEFICIENCY 02/27/2007   Chronic    Vitamin D deficiency    Family History  Problem Relation Age of Onset   Colon cancer Cousin 32       double first cousin   Colon cancer Cousin 110       double first cousin   Colon polyps Father        between 10-20   Dementia Father    CAD Father        CABG at age 78   Stroke Mother    Pancreatic cancer Brother 27   Colon polyps Brother        between 10-20   Cancer Paternal Uncle        NOS   Anemia Paternal Grandfather        pernicious anemia   Breast cancer Cousin 50       double first cousin   Breast cancer Cousin 53       paternal cousin   Stroke Other        F 1st degree relative 69, M 1st degree  relative   Esophageal cancer Neg Hx    Stomach cancer Neg Hx    Past Surgical History:  Procedure Laterality Date   BREAST BIOPSY Left 04/2014   BREAST RECONSTRUCTION WITH PLACEMENT OF TISSUE EXPANDER AND FLEX HD (ACELLULAR HYDRATED DERMIS) Left 07/28/2014   Procedure: LEFT BREAST RECONSTRUCTION PLACEMENT OF LEFT TISSUE EXPANDER ;  Surgeon: Etter Sjogren, MD;  Location: [facility] OR;  Service: Plastics;  Laterality: Left;   BREAST RECONSTRUCTION WITH PLACEMENT OF TISSUE EXPANDER AND FLEX HD (ACELLULAR HYDRATED DERMIS) Left 09/08/2014   Procedure: REMOVAL OF TISSUE EXPANDER FROM LEFT BREAST;  Surgeon: Etter Sjogren, MD;  Location: [facility] OR;  Service: Plastics;  Laterality: Left;   BUNIONECTOMY Bilateral 1970's   COLONOSCOPY     Dr Jarold Motto   CYSTOSCOPY WITH RETROGRADE PYELOGRAM, URETEROSCOPY AND STENT PLACEMENT Right 06/09/2015   Procedure: CYSTOSCOPY WITH RIGHT RETROGRADE PYELOGRAM, RIGHT URETEROSCOPY AND RIGHT URTERAL STENT PLACEMENT;  Surgeon: Crist Fat, MD;  Location: [facility] ORS;  Service: Urology;  Laterality: Right;   ESOPHAGOGASTRODUODENOSCOPY     HERNIA REPAIR     HOLMIUM LASER APPLICATION Right 06/09/2015   Procedure: HOLMIUM LASER APPLICATION;  Surgeon: Crist Fat, MD;  Location: [facility] ORS;  Service: Urology;  Laterality: Right;   LATISSIMUS FLAP TO BREAST Left 09/08/2014   Procedure: LEFT LATISSIMUS FLAP TO BREAST WITH SALINE IMPLANT FOR BREAST RECONSTRUCTION;  Surgeon: Etter Sjogren, MD;  Location: [facility] OR;  Service: Plastics;  Laterality: Left;   LUMBAR FUSION  01/2018   L5-S1 transitional segmental laminectomy and Fusion   MASTECTOMY Right 1996    chemotherapy. pt. states 13 lymph nodes were removed   MASTECTOMY COMPLETE / SIMPLE W/ SENTINEL NODE BIOPSY Left 07/28/2014   MASTECTOMY W/ SENTINEL NODE BIOPSY Left 07/28/2014   Procedure: LEFT MASTECTOMY WITH SENTINEL LYMPH NODE MAPPING;  Surgeon: Chevis Pretty III, MD;  Location: [facility] OR;  Service: General;  Laterality: Left;   MELANOMA EXCISION  Right 2010   From elbow-- Done at [facility]    NISSEN FUNDOPLICATION  09/2008   RECONSTRUCTION BREAST IMMEDIATE / DELAYED W/ TISSUE EXPANDER Left 07/28/2014   TEMPOROMANDIBULAR JOINT SURGERY Bilateral 1987   TONSILLECTOMY      Short Social History:  Social History   Tobacco Use   Smoking status: Never  Smokeless tobacco: Never   Tobacco comments:    Regular Exercise - Yes  Substance Use Topics   Alcohol use: No    Allergies  Allergen Reactions   Other Anaphylaxis and Swelling    Glue (eye lash glue, gorilla glue)   Silodosin Rash    Facial rash   Ace Inhibitors Other (See Comments)    REACTION: angioedema   Buprenorphine Hcl Other (See Comments)    "crazy"   Morphine And Codeine Other (See Comments)    "crazy" "crazy"   Codeine Rash   Sulfonamide Derivatives Rash   Telmisartan Rash    Current Outpatient Medications  Medication Sig Dispense Refill   amLODipine (NORVASC) 5 MG tablet Take 1 tablet (5 mg total) by mouth daily. 90 tablet 2   aspirin EC 81 MG tablet Take 81 mg by mouth daily. Swallow whole.     cholecalciferol (VITAMIN D3) 25 MCG (1000 UNIT) tablet Take 1,000 Units by mouth daily. Patient takes 2000 units     citalopram (CELEXA) 20 MG tablet Take 1 tablet (20 mg total) by mouth every morning. 90 tablet 0   Cyanocobalamin (VITAMIN B-12) 5000 MCG TBDP Take by mouth.     Evolocumab (REPATHA SURECLICK) 140 MG/ML SOAJ Inject 140 mg into the skin every 14 (fourteen) days. 6 mL 1   furosemide (LASIX) 40 MG tablet Take 1 tablet (40 mg total) by mouth every morning. 90 tablet 0   isosorbide mononitrate (IMDUR) 30 MG 24 hr tablet Take 1 tablet (30 mg total) by mouth every morning. 30 tablet 0   meloxicam (MOBIC) 15 MG tablet Take 1 tablet (15 mg total) by mouth daily. (Patient taking differently: Take 15 mg by mouth daily. AS NEEDED FOR SPASMS) 30 tablet 0   omeprazole (PRILOSEC) 40 MG capsule TAKE ONE CAPSULE BY MOUTH ONCE DAILY 90 capsule 3   No current  facility-administered medications for this visit.    REVIEW OF SYSTEMS   All other systems were reviewed and are negative     Objective:  Objective   Vitals:   06/13/23 1036  BP: (!) 149/90  Pulse: 77  Resp: 20  Temp: 97.9 F (36.6 C)  SpO2: 96%  Weight: 187 lb 8 oz (85 kg)  Height: 5\' 5"  (1.651 m)   Body mass index is 31.2 kg/m.  Physical Exam General: no acute distress Cardiac: hemodynamically stable, nontachycardic Pulm: normal work of breathing GI: non-tender, no pulsatile mass Neuro: alert, no focal deficit Extremities: Mild edema bilaterally, some venous congestion and reticular veins present bilaterally Vascular:   Right: Palpable femoral, DP  Left: Palpable femoral, DP   Data: Carotid duplex Right Carotid Findings:  +----------+--------+--------+--------+------------------+-----------------  ----+           PSV cm/sEDV cm/sStenosisPlaque DescriptionComments                +----------+--------+--------+--------+------------------+-----------------  ----+  CCA Prox  82      8                                                         +----------+--------+--------+--------+------------------+-----------------  ----+  CCA Distal72      17                                                        +----------+--------+--------+--------+------------------+-----------------  ----+  ICA Prox  56      13                                                        +----------+--------+--------+--------+------------------+-----------------  ----+  ICA Mid   123     32      1-39%                     tortuous and  mild                                                          bead-like  appearance                                                       with turbulent  flow    +----------+--------+--------+--------+------------------+-----------------  ----+  ICA [id number]     32                                 tortuous and  mild                                                          bead-like  appearance                                                       with turbulent  flow    +----------+--------+--------+--------+------------------+-----------------  ----+  ECA      84      10                                                        +----------+--------+--------+--------+------------------+-----------------  ----+   +----------+--------+-------+----------------+-----------------------------  ----+           PSV cm/sEDV cmsDescribe        Arm Pressure (mmHG)                 +----------+--------+-------+----------------+-----------------------------  ----+  [id number]           Multiphasic, WNLBreast Cancer History, Lymph                                                 Nodes Removed                       +----------+--------+-------+----------------+-----------------------------  ----+   +---------+--------+--+--------+--+---------+  VertebralPSV cm/s80EDV cm/s15Antegrade  +---------+--------+--+--------+--+---------+   The mid and distal ICA appears to have a mild bead-like appearance  consistent with fibromuscular dysplasia.   Left Carotid Findings:  +----------+--------+--------+--------+------------------+--------+           PSV cm/sEDV cm/sStenosisPlaque DescriptionComments  +----------+--------+--------+--------+------------------+--------+  CCA Prox  78      13                                          +----------+--------+--------+--------+------------------+--------+  CCA Distal70      20                                          +----------+--------+--------+--------+------------------+--------+  ICA Prox  92      23      1-39%   heterogenous                +----------+--------+--------+--------+------------------+--------+  ICA Mid   119     23                                           +----------+--------+--------+--------+------------------+--------+  ICA Distal88      26                                          +----------+--------+--------+--------+------------------+--------+  ECA      91      17                                          +----------+--------+--------+--------+------------------+--------+   +----------+--------+--------+----------------+-------------------+           PSV cm/sEDV cm/sDescribe        Arm Pressure (mmHG)  +----------+--------+--------+----------------+-------------------+  Subclavian100            Multiphasic, WNL140                  +----------+--------+--------+----------------+-------------------+   +---------+--------+--+--------+--+---------+  VertebralPSV cm/s80EDV cm/s10Antegrade  +---------+--------+--+--------+--+---------+   CMP reviewed Cr 0.85  MRA from 2022 reviewed No evidence of FMD     Assessment/Plan:     TAHARA VOSTERS is a 78 y.o. female with asymptomatic carotid artery stenosis and neuropathy.  I explained that her carotid plaque is very minimal and that a syncopal episode is unrelated to carotid disease.  I reviewed her previous MRA as there was a beaded appearance noted during the carotid duplex.  There is no evidence of FMD on the MRA. Explained that she has normal perfusion to her feet based on physical exam and that her burning sensation through her legs is likely related to her back pain.  Plan to follow-up in 12 months with repeat carotid duplex    Recommendations to optimize cardiovascular risk: Abstinence from all tobacco products. Blood glucose control with goal A1c < 7%. Blood pressure control with goal blood pressure < 140/90 mmHg. Lipid reduction therapy with goal LDL-C <100 mg/dL  Aspirin 81mg  PO QD.  Atorvastatin 40-80mg  PO QD (or other "high intensity" statin therapy).  Daria Pastures MD [facility]

## 2023-06-17 DIAGNOSIS — Z6830 Body mass index (BMI) 30.0-30.9, adult: Secondary | ICD-10-CM | POA: Diagnosis not present

## 2023-06-17 DIAGNOSIS — M963 Postlaminectomy kyphosis: Secondary | ICD-10-CM | POA: Diagnosis not present

## 2023-06-17 DIAGNOSIS — M4156 Other secondary scoliosis, lumbar region: Secondary | ICD-10-CM | POA: Diagnosis not present

## 2023-06-17 DIAGNOSIS — M41124 Adolescent idiopathic scoliosis, thoracic region: Secondary | ICD-10-CM | POA: Diagnosis not present

## 2023-06-19 DIAGNOSIS — F321 Major depressive disorder, single episode, moderate: Secondary | ICD-10-CM | POA: Diagnosis not present

## 2023-06-19 DIAGNOSIS — I69351 Hemiplegia and hemiparesis following cerebral infarction affecting right dominant side: Secondary | ICD-10-CM | POA: Diagnosis not present

## 2023-06-19 DIAGNOSIS — M810 Age-related osteoporosis without current pathological fracture: Secondary | ICD-10-CM | POA: Diagnosis not present

## 2023-06-19 DIAGNOSIS — K219 Gastro-esophageal reflux disease without esophagitis: Secondary | ICD-10-CM | POA: Diagnosis not present

## 2023-06-19 DIAGNOSIS — I25119 Atherosclerotic heart disease of native coronary artery with unspecified angina pectoris: Secondary | ICD-10-CM | POA: Diagnosis not present

## 2023-06-19 DIAGNOSIS — E785 Hyperlipidemia, unspecified: Secondary | ICD-10-CM | POA: Diagnosis not present

## 2023-06-19 DIAGNOSIS — R32 Unspecified urinary incontinence: Secondary | ICD-10-CM | POA: Diagnosis not present

## 2023-06-19 DIAGNOSIS — Z8249 Family history of ischemic heart disease and other diseases of the circulatory system: Secondary | ICD-10-CM | POA: Diagnosis not present

## 2023-06-19 DIAGNOSIS — M1990 Unspecified osteoarthritis, unspecified site: Secondary | ICD-10-CM | POA: Diagnosis not present

## 2023-06-19 DIAGNOSIS — Z7982 Long term (current) use of aspirin: Secondary | ICD-10-CM | POA: Diagnosis not present

## 2023-06-19 DIAGNOSIS — G629 Polyneuropathy, unspecified: Secondary | ICD-10-CM | POA: Diagnosis not present

## 2023-06-19 DIAGNOSIS — Z008 Encounter for other general examination: Secondary | ICD-10-CM | POA: Diagnosis not present

## 2023-06-19 DIAGNOSIS — I129 Hypertensive chronic kidney disease with stage 1 through stage 4 chronic kidney disease, or unspecified chronic kidney disease: Secondary | ICD-10-CM | POA: Diagnosis not present

## 2023-06-24 ENCOUNTER — Encounter: Payer: Medicare HMO | Admitting: Vascular Surgery

## 2023-06-26 DIAGNOSIS — C50911 Malignant neoplasm of unspecified site of right female breast: Secondary | ICD-10-CM | POA: Diagnosis not present

## 2023-06-26 DIAGNOSIS — C50912 Malignant neoplasm of unspecified site of left female breast: Secondary | ICD-10-CM | POA: Diagnosis not present

## 2023-06-30 ENCOUNTER — Other Ambulatory Visit: Payer: Self-pay | Admitting: Family

## 2023-06-30 DIAGNOSIS — R609 Edema, unspecified: Secondary | ICD-10-CM

## 2023-06-30 DIAGNOSIS — F419 Anxiety disorder, unspecified: Secondary | ICD-10-CM

## 2023-06-30 MED ORDER — FUROSEMIDE 40 MG PO TABS: 40.0000 mg | ORAL_TABLET | Freq: Every morning | ORAL | 1 refills | Status: DC

## 2023-06-30 MED ORDER — CITALOPRAM HYDROBROMIDE 20 MG PO TABS: 20.0000 mg | ORAL_TABLET | Freq: Every morning | ORAL | 1 refills | Status: DC

## 2023-06-30 NOTE — Progress Notes (Signed)
 Follow-up Visit   Date: 07/01/2023    [name] MRN: [id number] DOB: 05/04/1946    [name] is a 78 y.o. right-handed Caucasian female with hyperlipidemia, GERD, history of breast cancer (1996, 2016) s/p chemotherapy and bilateral mastectomy and right elbow melanoma, lumbar canal stenosis returning to the clinic for follow-up of dizziness.  The patient was accompanied to the clinic by husband who also provides collateral information.    IMPRESSION/PLAN: Dizziness vs presyncope, unclear etiology. MRI brain was personally viewed which shows scattered white matter changes, no abnormalities involving the cerebellum.  Orthostatic vital signs were negative.  She has neuropathy, but her symptoms are suggestive of more spinning sensation than lightheadedness and are more than I would expect with sensory ataxia.  I will check vitamin B12 and TSH.  She has mild peripheral neuorpathy affecting both feet manifesting with distal numbness, most likely chemotherapy-induced.  Return to clinic as needed  --------------------------------------------- UPDATE 07/01/2023:  She went to the ER in November after feeling as if she was going to pass out.  She reports being very lethargic and does not recall all the events.  In the ER, she was found to be dehydrated and potassium is very low.  Her symptoms improved within about an 1.5 hr. Since this time, she continues to have spells of dizziness. Spells last about a minute and then resolve.  She usually needs to hold onto something for support.  Prior testing including US  carotids 1-39% bilateral ICA stenosis, MRI brain with chronic microvascular changes, and she is wearing a heart monitor.    Medications:  Current Outpatient Medications on File Prior to Visit  Medication Sig Dispense Refill   amLODipine  (NORVASC ) 5 MG tablet Take 1 tablet (5 mg total) by mouth daily. 90 tablet 2   aspirin  EC 81 MG tablet Take 81 mg by mouth daily. Swallow whole.      cholecalciferol (VITAMIN D3) 25 MCG (1000 UNIT) tablet Take 1,000 Units by mouth daily. Patient takes 2000 units     citalopram  (CELEXA ) 20 MG tablet Take 1 tablet (20 mg total) by mouth every morning. 90 tablet 1   Cyanocobalamin  (VITAMIN B-12) 5000 MCG TBDP Take by mouth.     furosemide  (LASIX ) 40 MG tablet Take 1 tablet (40 mg total) by mouth every morning. 90 tablet 1   isosorbide  mononitrate (IMDUR ) 30 MG 24 hr tablet Take 1 tablet (30 mg total) by mouth every morning. 30 tablet 0   meloxicam  (MOBIC ) 15 MG tablet Take 1 tablet (15 mg total) by mouth daily. (Patient taking differently: Take 15 mg by mouth daily. AS NEEDED FOR SPASMS) 30 tablet 0   omeprazole  (PRILOSEC) 40 MG capsule TAKE ONE CAPSULE BY MOUTH ONCE DAILY 90 capsule 3   No current facility-administered medications on file prior to visit.    Allergies:  Allergies  Allergen Reactions   Other Anaphylaxis and Swelling    Glue (eye lash glue, gorilla glue)   Silodosin Rash    Facial rash   Ace Inhibitors Other (See Comments)    REACTION: angioedema   Buprenorphine Hcl Other (See Comments)    crazy   Morphine  And Codeine Other (See Comments)    crazy crazy   Codeine Rash   Sulfonamide Derivatives Rash   Telmisartan Rash    Vital Signs:  BP 132/84   Pulse 81   Ht 5' 5 (1.651 m)   Wt 184 lb (83.5 kg)   LMP  (LMP Unknown)  SpO2 97%   BMI 30.62 kg/m   Neurological Exam: MENTAL STATUS including orientation to time, place, person, recent and remote memory, attention span and concentration, language, and fund of knowledge is normal.  Speech is not dysarthric.  CRANIAL NERVES:  No visual field defects.  Pupils equal round and reactive to light.  Normal conjugate, extra-ocular eye movements in all directions of gaze.  No ptosis.  Face is symmetric. Palate elevates symmetrically.  Tongue is midline.  MOTOR:  Motor strength is 5/5 in all extremities.  No atrophy, fasciculations or abnormal movements.  No  pronator drift.  Tone is normal.    MSRs:  Reflexes are 2+/4 throughout and reduced at the ankles.  SENSORY:  Intact to vibration and temperature throughout.  Rhomberg testing is positive.   COORDINATION/GAIT:  Normal finger-to- nose-finger.  Intact rapid alternating movements bilaterally.  Gait is slow, unassisted and unsteady with turns.     Data: MRI brain 05/18/2023: 1.  No evidence of an acute intracranial abnormality. 2. Chronic small vessel ischemic changes within the cerebral white matter, basal ganglia and pons, overall moderate in severity and progressed from the prior MRI of 02/09/2022. 3. Minor paranasal sinus disease.  MRI lumbar spine 06/17/2023: 1. Transitional anatomy with a lumbarized S1 level, previous decompression and fusion at L5-S1. This is the same numbering system used on the September CT but differs from the MRI 1 year ago. Correlation with radiographs is recommended prior to any intervention.   2. Chronically augmented L1 compression fracture with stable adjacent L2 superior endplate degeneration. No new osseous abnormality.   3. Chronic L5-S1 decompression and fusion with no adverse features. Stable lumbar spine degeneration elsewhere since last year, including adjacent segment disease at L4-L5: Mild multifactorial spinal stenosis at T12-L1 through L4-L5 with mild lateral recess stenosis. No mass effect on the lower thoracic spinal cord or conus. Up to severe right T12 and moderate left L1 and L3 foraminal stenosis appears stable.  Lab Results  Component Value Date   TSH 2.61 02/01/2022   Lab Results  Component Value Date   VITAMINB12 333 02/01/2022      Thank you for allowing me to participate in patient's care.  If I can answer any additional questions, I would be pleased to do so.    Sincerely,    Dani Danis K. Tobie, DO

## 2023-06-30 NOTE — Telephone Encounter (Signed)
Copied from CRM [id number]. Topic: Clinical - Medication Refill >> Jun 30, 2023  3:07 PM Drema Balzarine wrote: Most Recent Primary Care Visit:  Provider: Ria Clock [facility]  Department: [facility]  Visit Type: OFFICE VISIT  Date: 04/29/2023  Medication: Furosemide & Citalopram   Has the patient contacted their pharmacy? Yes, pharmacy called - says patient is out of refills   (Agent: If no, request that the patient contact the pharmacy for the refill. If patient does not wish to contact the pharmacy document the reason why and proceed with request.) (Agent: If yes, when and what did the pharmacy advise?)  Is this the correct pharmacy for this prescription? Yes If no, delete pharmacy and type the correct one.  This is the patient's preferred pharmacy:   [facility] [address] [place], [place] - [address] [place] [address] Phone: [phone] Fax: [phone]   Has the prescription been filled recently? Yes  Is the patient out of the medication? Not Sure  Has the patient been seen for an appointment in the last year OR does the patient have an upcoming appointment? Yes  Can we respond through MyChart? No  Agent: Please be advised that Rx refills may take up to 3 business days. We ask that you follow-up with your pharmacy.

## 2023-07-01 ENCOUNTER — Ambulatory Visit (INDEPENDENT_AMBULATORY_CARE_PROVIDER_SITE_OTHER): Payer: Medicare HMO | Admitting: Neurology

## 2023-07-01 ENCOUNTER — Other Ambulatory Visit: Payer: Medicare HMO

## 2023-07-01 ENCOUNTER — Encounter: Payer: Self-pay | Admitting: Neurology

## 2023-07-01 VITALS — BP 132/84 | HR 81 | Ht 65.0 in | Wt 184.0 lb

## 2023-07-01 DIAGNOSIS — R42 Dizziness and giddiness: Secondary | ICD-10-CM

## 2023-07-01 DIAGNOSIS — Z5181 Encounter for therapeutic drug level monitoring: Secondary | ICD-10-CM | POA: Diagnosis not present

## 2023-07-01 DIAGNOSIS — F419 Anxiety disorder, unspecified: Secondary | ICD-10-CM | POA: Diagnosis not present

## 2023-07-01 DIAGNOSIS — E538 Deficiency of other specified B group vitamins: Secondary | ICD-10-CM | POA: Diagnosis not present

## 2023-07-01 DIAGNOSIS — G629 Polyneuropathy, unspecified: Secondary | ICD-10-CM | POA: Diagnosis not present

## 2023-07-01 DIAGNOSIS — E569 Vitamin deficiency, unspecified: Secondary | ICD-10-CM | POA: Diagnosis not present

## 2023-07-01 LAB — TSH: TSH: 1.8 m[IU]/L (ref 0.40–4.50)

## 2023-07-01 LAB — VITAMIN B12: Vitamin B-12: 283 pg/mL (ref 200–1100)

## 2023-07-07 ENCOUNTER — Telehealth: Payer: Self-pay | Admitting: Cardiology

## 2023-07-07 NOTE — Telephone Encounter (Signed)
 Pt requested to switch to Dr. Abel Hoe to be closer to home, is this switch okay?

## 2023-07-08 ENCOUNTER — Other Ambulatory Visit: Payer: Self-pay

## 2023-07-08 DIAGNOSIS — I6523 Occlusion and stenosis of bilateral carotid arteries: Secondary | ICD-10-CM

## 2023-07-15 DIAGNOSIS — R519 Headache, unspecified: Secondary | ICD-10-CM | POA: Diagnosis not present

## 2023-07-15 DIAGNOSIS — Z853 Personal history of malignant neoplasm of breast: Secondary | ICD-10-CM | POA: Diagnosis not present

## 2023-07-15 DIAGNOSIS — M5432 Sciatica, left side: Secondary | ICD-10-CM | POA: Diagnosis not present

## 2023-07-15 DIAGNOSIS — M4126 Other idiopathic scoliosis, lumbar region: Secondary | ICD-10-CM | POA: Diagnosis not present

## 2023-07-15 DIAGNOSIS — M963 Postlaminectomy kyphosis: Secondary | ICD-10-CM | POA: Diagnosis not present

## 2023-07-15 DIAGNOSIS — R42 Dizziness and giddiness: Secondary | ICD-10-CM | POA: Diagnosis not present

## 2023-07-15 DIAGNOSIS — M48062 Spinal stenosis, lumbar region with neurogenic claudication: Secondary | ICD-10-CM | POA: Diagnosis not present

## 2023-07-15 DIAGNOSIS — Z85820 Personal history of malignant melanoma of skin: Secondary | ICD-10-CM | POA: Diagnosis not present

## 2023-07-22 DIAGNOSIS — I6782 Cerebral ischemia: Secondary | ICD-10-CM | POA: Diagnosis not present

## 2023-07-22 DIAGNOSIS — R519 Headache, unspecified: Secondary | ICD-10-CM | POA: Diagnosis not present

## 2023-07-31 ENCOUNTER — Encounter (HOSPITAL_BASED_OUTPATIENT_CLINIC_OR_DEPARTMENT_OTHER): Payer: Self-pay

## 2023-08-01 ENCOUNTER — Encounter: Payer: Self-pay | Admitting: Cardiovascular Disease

## 2023-08-01 ENCOUNTER — Ambulatory Visit: Payer: Medicare HMO | Attending: Cardiovascular Disease | Admitting: Cardiovascular Disease

## 2023-08-01 VITALS — BP 130/80 | HR 73 | Ht 65.0 in | Wt 189.0 lb

## 2023-08-01 DIAGNOSIS — I10 Essential (primary) hypertension: Secondary | ICD-10-CM

## 2023-08-01 DIAGNOSIS — I2510 Atherosclerotic heart disease of native coronary artery without angina pectoris: Secondary | ICD-10-CM

## 2023-08-01 DIAGNOSIS — E7800 Pure hypercholesterolemia, unspecified: Secondary | ICD-10-CM | POA: Diagnosis not present

## 2023-08-01 NOTE — Progress Notes (Signed)
 Chief Complaint  Patient presents with   Follow-up    CAD   History of Present Illness: 78 yo female with history of CAD, HTN, HLD, SVT, breast cancer, prior CVA and fibromyalgia here today for follow up. She has been followed by Dr. Dulce Sellar but is changing to our [place] office today. She is known to have CAD with Coronary CTA in June 2022 with moderate non-obstructive LAD and Ramus intermediate stenosis, mild disease in RCA,  calcium score of 63. Echo June 2022 with LVEF=60-65%. Aortic valve sclerosis without stenosis. She wore a Zio cardiac monitor that was arranged by her insurance company and she says this showed sinus and SVT. She has had ongoing dizziness. She is being followed by Neurology and brain MRI showed chronic microvascular changes.   She is here today for follow up. The patient denies any exertional chest pain, dyspnea, palpitations, lower extremity edema, orthopnea, PND, dizziness, near syncope or syncope. She has ongoing issues with blurry vision. She has heartburn at times.   Primary Care Physician: Olive Bass, FNP   Past Medical History:  Diagnosis Date   Allergy    Ankle effusion, left 03/23/2020   Arthritis    "spine" (07/28/2014)   Blood dyscrasia    bruises and bleed easily   Breast cancer, left breast (HCC) 07/28/2014   Breast cancer, right breast (HCC) 1995   CHEST XRAY, ABNORMAL 01/04/2008   Qualifier: Diagnosis of  By: Plotnikov MD, Georgina Quint    Chronic back pain    "all over"   Chronic cough 12/07/2007   Could be related to GERD - relapsed    COAGULOPATHY 07/12/2008   Qualifier: Diagnosis of  By: Koleen Distance CMA (AAMA), Leisha     Complication of anesthesia    went to sleep easily but hard to wake up up until elbow OR in 2010   CVA (cerebral vascular accident) (HCC)    2022   DDD (degenerative disc disease), cervical 07/09/2017   DDD (degenerative disc disease), lumbar 07/09/2017   With spinal stenosis.   Dr. Dutch Quint   Depression     Dizziness 10/27/2020   Dyspnea    Normal Spirometry 03/2008 EF 65% BNP normal 11/2007   Episode of recurrent major depressive disorder (HCC) 12/05/2017   Essential hypertension 02/27/2007   Mild Off meds 2013  BP Readings from Last 3 Encounters:  02/10/12 128/90  08/09/11 134/62  03/06/11 150/86      Family history of breast cancer    Family history of colon cancer    Family history of pancreatic cancer    Fibromyalgia 02/27/2007   Chronic    Gastric polyps    GERD 02/27/2007   S/p Nissen's 2010    GERD (gastroesophageal reflux disease)    History of chicken pox    History of hiatal hernia    Hx of adenomatous polyp of colon 07/03/2015   Kidney stone    right kidney    Lichen sclerosus    Malignant neoplasm of upper-outer quadrant of left breast in female, 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   Medicare annual wellness visit, subsequent 06/30/2016   Melanoma (HCC) 2010   "right elbow; treated at [facility]"   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 ([facility])   Mild anxiety    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   Peripheral edema 11/26/2016   Peripheral neuropathy 01/01/2013   Primary osteoarthritis of both hands 07/09/2017   Primary osteoarthritis of both  knees 07/09/2017   Chondromalacia patella   Rheumatoid arthritis (HCC) 11/02/2018   S/P laparoscopic fundoplication 08/09/2011   Steatosis of liver 12/07/2009   Qualifier: Diagnosis of  By: Surface RN, Donna     Viral URI with cough 09/11/2014   Vitamin B12 deficiency    VITAMIN B12 DEFICIENCY 02/27/2007   Chronic    Vitamin D deficiency     Past Surgical History:  Procedure Laterality Date   BREAST BIOPSY Left 04/2014   BREAST RECONSTRUCTION WITH PLACEMENT OF TISSUE EXPANDER AND FLEX HD (ACELLULAR HYDRATED DERMIS) Left 07/28/2014   Procedure: LEFT BREAST RECONSTRUCTION PLACEMENT OF LEFT TISSUE EXPANDER ;  Surgeon: Etter Sjogren, MD;  Location: [facility] OR;  Service: Plastics;  Laterality: Left;    BREAST RECONSTRUCTION WITH PLACEMENT OF TISSUE EXPANDER AND FLEX HD (ACELLULAR HYDRATED DERMIS) Left 09/08/2014   Procedure: REMOVAL OF TISSUE EXPANDER FROM LEFT BREAST;  Surgeon: Etter Sjogren, MD;  Location: [facility] OR;  Service: Plastics;  Laterality: Left;   BUNIONECTOMY Bilateral 1970's   COLONOSCOPY     Dr Jarold Motto   CYSTOSCOPY WITH RETROGRADE PYELOGRAM, URETEROSCOPY AND STENT PLACEMENT Right 06/09/2015   Procedure: CYSTOSCOPY WITH RIGHT RETROGRADE PYELOGRAM, RIGHT URETEROSCOPY AND RIGHT URTERAL STENT PLACEMENT;  Surgeon: Crist Fat, MD;  Location: [facility] ORS;  Service: Urology;  Laterality: Right;   ESOPHAGOGASTRODUODENOSCOPY     HERNIA REPAIR     HOLMIUM LASER APPLICATION Right 06/09/2015   Procedure: HOLMIUM LASER APPLICATION;  Surgeon: Crist Fat, MD;  Location: [facility] ORS;  Service: Urology;  Laterality: Right;   LATISSIMUS FLAP TO BREAST Left 09/08/2014   Procedure: LEFT LATISSIMUS FLAP TO BREAST WITH SALINE IMPLANT FOR BREAST RECONSTRUCTION;  Surgeon: Etter Sjogren, MD;  Location: [facility] OR;  Service: Plastics;  Laterality: Left;   LUMBAR FUSION  01/2018   L5-S1 transitional segmental laminectomy and Fusion   MASTECTOMY Right 1996    chemotherapy. pt. states 13 lymph nodes were removed   MASTECTOMY COMPLETE / SIMPLE W/ SENTINEL NODE BIOPSY Left 07/28/2014   MASTECTOMY W/ SENTINEL NODE BIOPSY Left 07/28/2014   Procedure: LEFT MASTECTOMY WITH SENTINEL LYMPH NODE MAPPING;  Surgeon: Chevis Pretty III, MD;  Location: [facility] OR;  Service: General;  Laterality: Left;   MELANOMA EXCISION Right 2010   From elbow-- Done at [facility]    NISSEN FUNDOPLICATION  09/2008   RECONSTRUCTION BREAST IMMEDIATE / DELAYED W/ TISSUE EXPANDER Left 07/28/2014   TEMPOROMANDIBULAR JOINT SURGERY Bilateral 1987   TONSILLECTOMY      Current Outpatient Medications  Medication Sig Dispense Refill   aspirin EC 81 MG tablet Take 81 mg by mouth daily. Swallow whole.     cholecalciferol (VITAMIN D3) 25 MCG (1000 UNIT) tablet Take  1,000 Units by mouth daily. Patient takes 2000 units     citalopram (CELEXA) 20 MG tablet Take 1 tablet (20 mg total) by mouth every morning. 90 tablet 1   Cyanocobalamin (VITAMIN B-12) 5000 MCG TBDP Take by mouth.     furosemide (LASIX) 40 MG tablet Take 1 tablet (40 mg total) by mouth every morning. 90 tablet 1   meloxicam (MOBIC) 15 MG tablet Take 1 tablet (15 mg total) by mouth daily. (Patient taking differently: Take 15 mg by mouth daily. AS NEEDED FOR SPASMS) 30 tablet 0   amLODipine (NORVASC) 5 MG tablet Take 1 tablet (5 mg total) by mouth daily. 90 tablet 2   isosorbide mononitrate (IMDUR) 30 MG 24 hr tablet Take 1 tablet (30 mg total) by mouth every morning. 30 tablet  0   omeprazole (PRILOSEC) 40 MG capsule TAKE ONE CAPSULE BY MOUTH ONCE DAILY 90 capsule 3   No current facility-administered medications for this visit.    Allergies  Allergen Reactions   Other Anaphylaxis and Swelling    Glue (eye lash glue, gorilla glue)   Silodosin Rash    Facial rash   Ace Inhibitors Other (See Comments)    REACTION: angioedema   Buprenorphine Hcl Other (See Comments)    "crazy"   Morphine And Codeine Other (See Comments)    "crazy" "crazy"   Codeine Rash   Sulfonamide Derivatives Rash   Telmisartan Rash    Social History   Socioeconomic History   Marital status: Married    Spouse name: Not on file   Number of children: 2   Years of education: Not on file   Highest education level: Not on file  Occupational History   Occupation: Production designer, theatre/television/film  Tobacco Use   Smoking status: Never   Smokeless tobacco: Never   Tobacco comments:    Regular Exercise - Yes  Vaping Use   Vaping status: Never Used  Substance and Sexual Activity   Alcohol use: No   Drug use: No   Sexual activity: Yes  Other Topics Concern   Not on file  Social History Narrative   She lives with husband.     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         Right  Handed    Lives in a one story home with a basement    Social Drivers of Health   Financial Resource Strain: Low Risk  (09/12/2022)   Overall Financial Resource Strain (CARDIA)    Difficulty of Paying Living Expenses: Not hard at all  Food Insecurity: No Food Insecurity (09/12/2022)   Hunger Vital Sign    Worried About Running Out of Food in the Last Year: Never true    Ran Out of Food in the Last Year: Never true  Transportation Needs: No Transportation Needs (09/12/2022)   PRAPARE - Administrator, Civil Service (Medical): No    Lack of Transportation (Non-Medical): No  Physical Activity: Inactive (09/12/2022)   Exercise Vital Sign    Days of Exercise per Week: 0 days    Minutes of Exercise per Session: 0 min  Stress: No Stress Concern Present (09/12/2022)   [employer] of Occupational Health - Occupational Stress Questionnaire    Feeling of Stress : Only a little  Social Connections: Socially Integrated (09/12/2022)   Social Connection and Isolation Panel [NHANES]    Frequency of Communication with Friends and Family: More than three times a week    Frequency of Social Gatherings with Friends and Family: More than three times a week    Attends Religious Services: More than 4 times per year    Active Member of [employer] or Organizations: Yes    Attends Engineer, structural: More than 4 times per year    Marital Status: Married  Catering manager Violence: Not At Risk (09/12/2022)   Humiliation, Afraid, Rape, and Kick questionnaire    Fear of Current or Ex-Partner: No    Emotionally Abused: No    Physically Abused: No    Sexually Abused: No    Family History  Problem Relation Age of Onset   Colon cancer Cousin 49       double first cousin   Colon cancer Cousin 54  double first cousin   Colon polyps Father        between 10-20   Dementia Father    CAD Father        CABG at age 73   Stroke Mother    Pancreatic cancer Brother 45   Colon polyps  Brother        between 10-20   Cancer Paternal Uncle        NOS   Anemia Paternal Grandfather        pernicious anemia   Breast cancer Cousin 51       double first cousin   Breast cancer Cousin 26       paternal cousin   Stroke Other        F 1st degree relative 13, M 1st degree relative   Esophageal cancer Neg Hx    Stomach cancer Neg Hx     Review of Systems:  As stated in the HPI and otherwise negative.   BP 130/80   Pulse 73   Ht 5\' 5"  (1.651 m)   Wt 85.7 kg   LMP  (LMP Unknown)   SpO2 98%   BMI 31.45 kg/m   Physical Examination: General: Well developed, well nourished, NAD  HEENT: OP clear, mucus membranes moist  SKIN: warm, dry. No rashes. Neuro: No focal deficits  Musculoskeletal: Muscle strength 5/5 all ext  Psychiatric: Mood and affect normal  Neck: No JVD, no carotid bruits, no thyromegaly, no lymphadenopathy.  Lungs:Clear bilaterally, no wheezes, rhonci, crackles Cardiovascular: Regular rate and rhythm. No murmurs, gallops or rubs. Abdomen:Soft. Bowel sounds present. Non-tender.  Extremities: No lower extremity edema. Pulses are 2 + in the bilateral DP/PT.  EKG:  EKG is not ordered today. The ekg ordered today demonstrates   Recent Labs: 04/29/2023: ALT 13; BUN 15; Creatinine, Ser 0.85; Hemoglobin 14.3; Platelets 202.0; Potassium 3.7; Sodium 142 07/01/2023: TSH 1.80   Lipid Panel    Component Value Date/Time   CHOL 153 07/03/2022 1535   TRIG 90 07/03/2022 1535   HDL 50 07/03/2022 1535   CHOLHDL 3.1 07/03/2022 1535   CHOLHDL 4.6 10/27/2020 0050   VLDL 16 10/27/2020 0050   LDLCALC 86 07/03/2022 1535   LDLDIRECT 179.7 02/10/2012 0920     Wt Readings from Last 3 Encounters:  08/01/23 85.7 kg  07/01/23 83.5 kg  06/13/23 85 kg    Assessment and Plan:   1. CAD without angina: No chest pain suggestive of angina. Moderate CAD by coronary CTA in June 2022. Continue ASA. We discussed her previous intolerance to statin therapy. She had been on Repatha  but stopped due to cost. See below.    2. HTN: BP is normal She stopped her Norvasc and Imdur last week when her prescription ran out. Her BP has been normal. Will not restart Norvasc or Imdur.   3. Hyperlipidemia: Lipids followed in primary care. Given her CAD, lipid lowering therapy is indicated. She has not tolerated Crestor in the past. She had been on Repatha but stopped this due to cost. Will refer to lipid clinic to discuss options for therapy.   Labs/ tests ordered today include:  No orders of the defined types were placed in this encounter.  Disposition:   F/U with me in 12 months    Signed, Verne Carrow, MD, [facility] 08/01/2023 11:17 AM    [facility] [address] [place], [place], [place]  [address] Phone: [phone]; Fax: [phone]

## 2023-08-01 NOTE — Patient Instructions (Signed)
 Medication Instructions:  No changes *If you need a refill on your cardiac medications before your next appointment, please call your pharmacy*   Lab Work: none If you have labs (blood work) drawn today and your tests are completely normal, you will receive your results only by: MyChart Message (if you have MyChart) OR A paper copy in the mail If you have any lab test that is abnormal or we need to change your treatment, we will call you to review the results.   Testing/Procedures: none   Follow-Up: At [facility], you and your health needs are our priority.  As part of our continuing mission to provide you with exceptional heart care, we have created designated Provider Care Teams.  These Care Teams include your primary Cardiologist (physician) and Advanced Practice Providers (APPs -  Physician Assistants and Nurse Practitioners) who all work together to provide you with the care you need, when you need it.   Your next appointment:   12 month(s)  Provider:   Verne Carrow, MD  Other Instructions You have been referred to our clinical pharmacy team for lipid management.

## 2023-08-15 DIAGNOSIS — W19XXXS Unspecified fall, sequela: Secondary | ICD-10-CM | POA: Diagnosis not present

## 2023-08-15 DIAGNOSIS — M79606 Pain in leg, unspecified: Secondary | ICD-10-CM | POA: Diagnosis not present

## 2023-08-15 DIAGNOSIS — M6281 Muscle weakness (generalized): Secondary | ICD-10-CM | POA: Diagnosis not present

## 2023-08-15 DIAGNOSIS — M4316 Spondylolisthesis, lumbar region: Secondary | ICD-10-CM | POA: Diagnosis not present

## 2023-08-15 DIAGNOSIS — G8929 Other chronic pain: Secondary | ICD-10-CM | POA: Diagnosis not present

## 2023-08-15 DIAGNOSIS — M546 Pain in thoracic spine: Secondary | ICD-10-CM | POA: Diagnosis not present

## 2023-08-15 DIAGNOSIS — R269 Unspecified abnormalities of gait and mobility: Secondary | ICD-10-CM | POA: Diagnosis not present

## 2023-08-15 DIAGNOSIS — H538 Other visual disturbances: Secondary | ICD-10-CM | POA: Diagnosis not present

## 2023-08-15 DIAGNOSIS — R293 Abnormal posture: Secondary | ICD-10-CM | POA: Diagnosis not present

## 2023-08-15 DIAGNOSIS — Z7409 Other reduced mobility: Secondary | ICD-10-CM | POA: Diagnosis not present

## 2023-08-21 DIAGNOSIS — W19XXXS Unspecified fall, sequela: Secondary | ICD-10-CM | POA: Diagnosis not present

## 2023-08-21 DIAGNOSIS — R269 Unspecified abnormalities of gait and mobility: Secondary | ICD-10-CM | POA: Diagnosis not present

## 2023-08-21 DIAGNOSIS — M4316 Spondylolisthesis, lumbar region: Secondary | ICD-10-CM | POA: Diagnosis not present

## 2023-08-21 DIAGNOSIS — M6281 Muscle weakness (generalized): Secondary | ICD-10-CM | POA: Diagnosis not present

## 2023-08-21 DIAGNOSIS — Z7409 Other reduced mobility: Secondary | ICD-10-CM | POA: Diagnosis not present

## 2023-08-21 DIAGNOSIS — M79606 Pain in leg, unspecified: Secondary | ICD-10-CM | POA: Diagnosis not present

## 2023-08-21 DIAGNOSIS — M546 Pain in thoracic spine: Secondary | ICD-10-CM | POA: Diagnosis not present

## 2023-08-21 DIAGNOSIS — H538 Other visual disturbances: Secondary | ICD-10-CM | POA: Diagnosis not present

## 2023-08-21 DIAGNOSIS — G8929 Other chronic pain: Secondary | ICD-10-CM | POA: Diagnosis not present

## 2023-08-21 DIAGNOSIS — R293 Abnormal posture: Secondary | ICD-10-CM | POA: Diagnosis not present

## 2023-08-22 ENCOUNTER — Telehealth: Payer: Self-pay

## 2023-08-22 NOTE — Telephone Encounter (Signed)
 Copied from CRM [id number]. Topic: Clinical - Medical Advice >> Aug 21, 2023  5:15 PM Ernst Spell wrote: Reason for CRM: Vicie Mutters, physical therapist, from [facility] called & stated that today was the patient's 1st return for Evaluation. She was referred to them from her Spine Specialist.    [facility] called to report that the patient has had 3 falls this week and noticeable soft tissue on her left leg. She fell on her leg, head, and buttocks on different occasions. The last fall was Sunday. She wanted to discuss if Vernona Rieger would like to see the patient tomorrow.   Additionally, the patient reported to them that she has been experiencing light headedness, black out, & she has lost her balance multiple times which has caused some of the most recent incidents that are being reported. Allison's best contact is [phone]. Please advise.

## 2023-08-22 NOTE — Telephone Encounter (Signed)
 Called [name] back and left a VM asking her to give the office a call back.

## 2023-08-25 DIAGNOSIS — R293 Abnormal posture: Secondary | ICD-10-CM | POA: Diagnosis not present

## 2023-08-25 DIAGNOSIS — M546 Pain in thoracic spine: Secondary | ICD-10-CM | POA: Diagnosis not present

## 2023-08-25 DIAGNOSIS — Z7409 Other reduced mobility: Secondary | ICD-10-CM | POA: Diagnosis not present

## 2023-08-25 DIAGNOSIS — M79606 Pain in leg, unspecified: Secondary | ICD-10-CM | POA: Diagnosis not present

## 2023-08-25 DIAGNOSIS — H538 Other visual disturbances: Secondary | ICD-10-CM | POA: Diagnosis not present

## 2023-08-25 DIAGNOSIS — M6281 Muscle weakness (generalized): Secondary | ICD-10-CM | POA: Diagnosis not present

## 2023-08-25 DIAGNOSIS — G8929 Other chronic pain: Secondary | ICD-10-CM | POA: Diagnosis not present

## 2023-08-25 DIAGNOSIS — R269 Unspecified abnormalities of gait and mobility: Secondary | ICD-10-CM | POA: Diagnosis not present

## 2023-08-25 DIAGNOSIS — M4316 Spondylolisthesis, lumbar region: Secondary | ICD-10-CM | POA: Diagnosis not present

## 2023-08-25 DIAGNOSIS — W19XXXS Unspecified fall, sequela: Secondary | ICD-10-CM | POA: Diagnosis not present

## 2023-08-26 DIAGNOSIS — R42 Dizziness and giddiness: Secondary | ICD-10-CM | POA: Diagnosis not present

## 2023-08-26 DIAGNOSIS — H2513 Age-related nuclear cataract, bilateral: Secondary | ICD-10-CM | POA: Diagnosis not present

## 2023-08-26 DIAGNOSIS — H532 Diplopia: Secondary | ICD-10-CM | POA: Diagnosis not present

## 2023-08-26 DIAGNOSIS — H25013 Cortical age-related cataract, bilateral: Secondary | ICD-10-CM | POA: Diagnosis not present

## 2023-08-26 DIAGNOSIS — R269 Unspecified abnormalities of gait and mobility: Secondary | ICD-10-CM | POA: Diagnosis not present

## 2023-08-27 DIAGNOSIS — M6281 Muscle weakness (generalized): Secondary | ICD-10-CM | POA: Diagnosis not present

## 2023-08-27 DIAGNOSIS — R293 Abnormal posture: Secondary | ICD-10-CM | POA: Diagnosis not present

## 2023-08-27 DIAGNOSIS — M546 Pain in thoracic spine: Secondary | ICD-10-CM | POA: Diagnosis not present

## 2023-08-27 DIAGNOSIS — R269 Unspecified abnormalities of gait and mobility: Secondary | ICD-10-CM | POA: Diagnosis not present

## 2023-08-27 DIAGNOSIS — M4316 Spondylolisthesis, lumbar region: Secondary | ICD-10-CM | POA: Diagnosis not present

## 2023-08-27 DIAGNOSIS — G8929 Other chronic pain: Secondary | ICD-10-CM | POA: Diagnosis not present

## 2023-08-27 DIAGNOSIS — Z7409 Other reduced mobility: Secondary | ICD-10-CM | POA: Diagnosis not present

## 2023-08-27 DIAGNOSIS — W19XXXS Unspecified fall, sequela: Secondary | ICD-10-CM | POA: Diagnosis not present

## 2023-08-27 DIAGNOSIS — M79606 Pain in leg, unspecified: Secondary | ICD-10-CM | POA: Diagnosis not present

## 2023-08-27 DIAGNOSIS — H538 Other visual disturbances: Secondary | ICD-10-CM | POA: Diagnosis not present

## 2023-08-28 ENCOUNTER — Ambulatory Visit (HOSPITAL_BASED_OUTPATIENT_CLINIC_OR_DEPARTMENT_OTHER)
Admission: RE | Admit: 2023-08-28 | Discharge: 2023-08-28 | Disposition: A | Discharge status: Home / Self Care | Source: Ambulatory Visit | Attending: Medical | Admitting: Medical

## 2023-08-28 ENCOUNTER — Encounter: Payer: Self-pay | Admitting: Medical

## 2023-08-28 ENCOUNTER — Ambulatory Visit: Admitting: Medical

## 2023-08-28 VITALS — BP 148/80 | HR 90 | Temp 98.0°F | Resp 18 | Ht 65.0 in | Wt 187.0 lb

## 2023-08-28 DIAGNOSIS — M1611 Unilateral primary osteoarthritis, right hip: Secondary | ICD-10-CM | POA: Diagnosis not present

## 2023-08-28 DIAGNOSIS — M25551 Pain in right hip: Secondary | ICD-10-CM | POA: Insufficient documentation

## 2023-08-28 DIAGNOSIS — M7989 Other specified soft tissue disorders: Secondary | ICD-10-CM | POA: Diagnosis not present

## 2023-08-28 DIAGNOSIS — M79662 Pain in left lower leg: Secondary | ICD-10-CM | POA: Diagnosis not present

## 2023-08-28 DIAGNOSIS — R600 Localized edema: Secondary | ICD-10-CM | POA: Diagnosis not present

## 2023-08-28 DIAGNOSIS — M898X6 Other specified disorders of bone, lower leg: Secondary | ICD-10-CM

## 2023-08-28 DIAGNOSIS — M25562 Pain in left knee: Secondary | ICD-10-CM | POA: Insufficient documentation

## 2023-08-28 DIAGNOSIS — T148XXA Other injury of unspecified body region, initial encounter: Secondary | ICD-10-CM | POA: Diagnosis not present

## 2023-08-28 DIAGNOSIS — M85862 Other specified disorders of bone density and structure, left lower leg: Secondary | ICD-10-CM | POA: Diagnosis not present

## 2023-08-28 DIAGNOSIS — I10 Essential (primary) hypertension: Secondary | ICD-10-CM

## 2023-08-28 DIAGNOSIS — M1712 Unilateral primary osteoarthritis, left knee: Secondary | ICD-10-CM | POA: Diagnosis not present

## 2023-08-28 DIAGNOSIS — M79605 Pain in left leg: Secondary | ICD-10-CM | POA: Diagnosis not present

## 2023-08-28 LAB — CBC WITH DIFFERENTIAL/PLATELET
Basophils Absolute: 0.1 10*3/uL (ref 0.0–0.1)
Basophils Relative: 1.1 % (ref 0.0–3.0)
Eosinophils Absolute: 0.3 10*3/uL (ref 0.0–0.7)
Eosinophils Relative: 4.9 % (ref 0.0–5.0)
HCT: 41.5 % (ref 36.0–46.0)
Hemoglobin: 13.9 g/dL (ref 12.0–15.0)
Lymphocytes Relative: 21.9 % (ref 12.0–46.0)
Lymphs Abs: 1.4 10*3/uL (ref 0.7–4.0)
MCHC: 33.4 g/dL (ref 30.0–36.0)
MCV: 92.5 fl (ref 78.0–100.0)
Monocytes Absolute: 0.6 10*3/uL (ref 0.1–1.0)
Monocytes Relative: 9.3 % (ref 3.0–12.0)
Neutro Abs: 4.1 10*3/uL (ref 1.4–7.7)
Neutrophils Relative %: 62.8 % (ref 43.0–77.0)
Platelets: 186 10*3/uL (ref 150.0–400.0)
RBC: 4.49 Mil/uL (ref 3.87–5.11)
RDW: 14 % (ref 11.5–15.5)
WBC: 6.5 10*3/uL (ref 4.0–10.5)

## 2023-08-28 LAB — COMPREHENSIVE METABOLIC PANEL WITH GFR
ALT: 15 U/L (ref 0–35)
AST: 18 U/L (ref 0–37)
Albumin: 4.2 g/dL (ref 3.5–5.2)
Alkaline Phosphatase: 64 U/L (ref 39–117)
BUN: 14 mg/dL (ref 6–23)
CO2: 34 meq/L — ABNORMAL HIGH (ref 19–32)
Calcium: 9.5 mg/dL (ref 8.4–10.5)
Chloride: 105 meq/L (ref 96–112)
Creatinine, Ser: 0.89 mg/dL (ref 0.40–1.20)
GFR: 62.47 mL/min (ref >60.00)
Glucose, Bld: 64 mg/dL — ABNORMAL LOW (ref 70–99)
Potassium: 4.3 meq/L (ref 3.5–5.1)
Sodium: 144 meq/L (ref 135–145)
Total Bilirubin: 0.6 mg/dL (ref 0.2–1.2)
Total Protein: 6.4 g/dL (ref 6.0–8.3)

## 2023-08-28 NOTE — Patient Instructions (Signed)
 Frequent falls(with pain in tibia, knee and hip) Three falls in eight days linked to balance issues and leg weakness. Left leg swelling and bruising suggest possible fractures or blood clots. Right hip bruised but not painful during ambulation. - Order left knee and tibia-fibula x-rays. - Order left lower extremity ultrasound. - Order right hip x-ray.  Left lower extremity swelling and bruising Significant swelling and bruising in left pretibial area with potential for severe contusion, hematoma, or blood clot. Bruising may indicate low platelet levels or trauma severity. - Order CBC to assess platelet levels. - Order metabolic panel to evaluate kidney function and electrolytes. -lt lower ext [place]  Hypertension Blood pressure elevated at 148/80 mmHg, possibly due to reduced Lasix dosage. Monitoring and lab evaluation needed for management. - Monitor blood pressure daily for one week.. Give me update by mychart. Plan to forward bp results and lab to your pcp.  Follow up date to be determined after lab, imaging review and bp update review. - Review blood pressure and lab results to determine if adjustments to diuretic or antihypertensive therapy are needed. - Consider low-dose antihypertensive medication such as losartan or valsartan if blood pressure remains elevated and kidney function is stable.  Diuretic management Lasix 20 mg every other day reduced due to dehydration concerns. Hydration status and kidney function to guide diuretic management. Potential combination therapy with low-dose Lasix and antihypertensive based on lab results and blood pressure. - Review metabolic panel results to assess hydration status and kidney function. - Coordinate with Vernona Rieger regarding potential adjustments to diuretic therapy based on lab results and blood pressure readings.

## 2023-08-28 NOTE — Progress Notes (Signed)
 Subjective:    Patient ID: [name], female    DOB: August 21, 1945, 78 y.o.   MRN: [id number]  HPI       Review of Systems  Constitutional:  Negative for chills, fatigue and fever.  Respiratory:  Negative for cough, chest tightness and wheezing.   Cardiovascular:  Negative for chest pain and palpitations.  Gastrointestinal:  Negative for abdominal pain.  Genitourinary:  Negative for dysuria.  Neurological:  Negative for seizures, weakness and headaches.       Baseline chronic dizziness and balance issues. She is seeing PT for balance.   No described siginicant head type injury on fall.   Hematological:  Negative for adenopathy. Does not bruise/bleed easily.    Past Medical History:  Diagnosis Date   Allergy    Ankle effusion, left 03/23/2020   Arthritis    "spine" (07/28/2014)   Blood dyscrasia    bruises and bleed easily   Breast cancer, left breast (HCC) 07/28/2014   Breast cancer, right breast (HCC) 1995   CHEST XRAY, ABNORMAL 01/04/2008   Qualifier: Diagnosis of  By: Plotnikov MD, Georgina Quint    Chronic back pain    "all over"   Chronic cough 12/07/2007   Could be related to GERD - relapsed    COAGULOPATHY 07/12/2008   Qualifier: Diagnosis of  By: Koleen Distance CMA (AAMA), Leisha     Complication of anesthesia    went to sleep easily but hard to wake up up until elbow OR in 2010   CVA (cerebral vascular accident) (HCC)    2022   DDD (degenerative disc disease), cervical 07/09/2017   DDD (degenerative disc disease), lumbar 07/09/2017   With spinal stenosis.   Dr. Dutch Quint   Depression    Dizziness 10/27/2020   Dyspnea    Normal Spirometry 03/2008 EF 65% BNP normal 11/2007   Episode of recurrent major depressive disorder (HCC) 12/05/2017   Essential hypertension 02/27/2007   Mild Off meds 2013  BP Readings from Last 3 Encounters:  02/10/12 128/90  08/09/11 134/62  03/06/11 150/86      Family history of breast cancer    Family history of colon cancer    Family history  of pancreatic cancer    Fibromyalgia 02/27/2007   Chronic    Gastric polyps    GERD 02/27/2007   S/p Nissen's 2010    GERD (gastroesophageal reflux disease)    History of chicken pox    History of hiatal hernia    Hx of adenomatous polyp of colon 07/03/2015   Kidney stone    right kidney    Lichen sclerosus    Malignant neoplasm of upper-outer quadrant of left breast in female, 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   Medicare annual wellness visit, subsequent 06/30/2016   Melanoma (HCC) 2010   "right elbow; treated at [facility]"   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 ([facility])   Mild anxiety    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   Peripheral edema 11/26/2016   Peripheral neuropathy 01/01/2013   Primary osteoarthritis of both hands 07/09/2017   Primary osteoarthritis of both knees 07/09/2017   Chondromalacia patella   Rheumatoid arthritis (HCC) 11/02/2018   S/P laparoscopic fundoplication 08/09/2011   Steatosis of liver 12/07/2009   Qualifier: Diagnosis of  By: Surface RN, Donna     Viral URI with cough 09/11/2014   Vitamin B12 deficiency    VITAMIN B12 DEFICIENCY 02/27/2007   Chronic  Vitamin D deficiency      Social History   Socioeconomic History   Marital status: Married    Spouse name: Not on file   Number of children: 2   Years of education: Not on file   Highest education level: Not on file  Occupational History   Occupation: Production designer, theatre/television/film  Tobacco Use   Smoking status: Never   Smokeless tobacco: Never   Tobacco comments:    Regular Exercise - Yes  Vaping Use   Vaping status: Never Used  Substance and Sexual Activity   Alcohol use: No   Drug use: No   Sexual activity: Yes  Other Topics Concern   Not on file  Social History Narrative   She lives with husband.     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         Right Handed    Lives in a one story home with a basement     Social Drivers of Health   Financial Resource Strain: Low Risk  (09/12/2022)   Overall Financial Resource Strain (CARDIA)    Difficulty of Paying Living Expenses: Not hard at all  Food Insecurity: No Food Insecurity (09/12/2022)   Hunger Vital Sign    Worried About Running Out of Food in the Last Year: Never true    Ran Out of Food in the Last Year: Never true  Transportation Needs: No Transportation Needs (09/12/2022)   PRAPARE - Administrator, Civil Service (Medical): No    Lack of Transportation (Non-Medical): No  Physical Activity: Inactive (09/12/2022)   Exercise Vital Sign    Days of Exercise per Week: 0 days    Minutes of Exercise per Session: 0 min  Stress: No Stress Concern Present (09/12/2022)   [employer] of Occupational Health - Occupational Stress Questionnaire    Feeling of Stress : Only a little  Social Connections: Socially Integrated (09/12/2022)   Social Connection and Isolation Panel [NHANES]    Frequency of Communication with Friends and Family: More than three times a week    Frequency of Social Gatherings with Friends and Family: More than three times a week    Attends Religious Services: More than 4 times per year    Active Member of [employer] or Organizations: Yes    Attends Banker Meetings: More than 4 times per year    Marital Status: Married  Catering manager Violence: Not At Risk (09/12/2022)   Humiliation, Afraid, Rape, and Kick questionnaire    Fear of Current or Ex-Partner: No    Emotionally Abused: No    Physically Abused: No    Sexually Abused: No    Past Surgical History:  Procedure Laterality Date   BREAST BIOPSY Left 04/2014   BREAST RECONSTRUCTION WITH PLACEMENT OF TISSUE EXPANDER AND FLEX HD (ACELLULAR HYDRATED DERMIS) Left 07/28/2014   Procedure: LEFT BREAST RECONSTRUCTION PLACEMENT OF LEFT TISSUE EXPANDER ;  Surgeon: Etter Sjogren, MD;  Location: [facility] OR;  Service: Plastics;  Laterality: Left;   BREAST  RECONSTRUCTION WITH PLACEMENT OF TISSUE EXPANDER AND FLEX HD (ACELLULAR HYDRATED DERMIS) Left 09/08/2014   Procedure: REMOVAL OF TISSUE EXPANDER FROM LEFT BREAST;  Surgeon: Etter Sjogren, MD;  Location: [facility] OR;  Service: Plastics;  Laterality: Left;   BUNIONECTOMY Bilateral 1970's   COLONOSCOPY     Dr Jarold Motto   CYSTOSCOPY WITH RETROGRADE PYELOGRAM, URETEROSCOPY AND STENT PLACEMENT Right 06/09/2015   Procedure: CYSTOSCOPY WITH RIGHT  RETROGRADE PYELOGRAM, RIGHT URETEROSCOPY AND RIGHT URTERAL STENT PLACEMENT;  Surgeon: Crist Fat, MD;  Location: [facility] ORS;  Service: Urology;  Laterality: Right;   ESOPHAGOGASTRODUODENOSCOPY     HERNIA REPAIR     HOLMIUM LASER APPLICATION Right 06/09/2015   Procedure: HOLMIUM LASER APPLICATION;  Surgeon: Crist Fat, MD;  Location: [facility] OR[facility];  Service: Urology;  Laterality: Right;   LATISSIMUS FLAP TO BREAST Left 09/08/2014   Procedure: LEFT LATISSIMUS FLAP TO BREAST WITH SALINE IMPLANT FOR BREAST RECONSTRUCTION;  Surgeon: Etter Sjogren, MD;  Location: [facility] OR;  Service: Plastics;  Laterality: Left;   LUMBAR FUSION  01/2018   L5-S1 transitional segmental laminectomy and Fusion   MASTECTOMY Right 1996    chemotherapy. pt. states 13 lymph nodes were removed   MASTECTOMY COMPLETE / SIMPLE W/ SENTINEL NODE BIOPSY Left 07/28/2014   MASTECTOMY W/ SENTINEL NODE BIOPSY Left 07/28/2014   Procedure: LEFT MASTECTOMY WITH SENTINEL LYMPH NODE MAPPING;  Surgeon: Chevis Pretty III, MD;  Location: [facility] OR;  Service: General;  Laterality: Left;   MELANOMA EXCISION Right 2010   From elbow-- Done at [facility]    NISSEN FUNDOPLICATION  09/2008   RECONSTRUCTION BREAST IMMEDIATE / DELAYED W/ TISSUE EXPANDER Left 07/28/2014   TEMPOROMANDIBULAR JOINT SURGERY Bilateral 1987   TONSILLECTOMY      Family History  Problem Relation Age of Onset   Colon cancer Cousin 19       double first cousin   Colon cancer Cousin 55       double first cousin   Colon polyps Father        between 10-20    Dementia Father    CAD Father        CABG at age 58   Stroke Mother    Pancreatic cancer Brother 33   Colon polyps Brother        between 10-20   Cancer Paternal Uncle        NOS   Anemia Paternal Grandfather        pernicious anemia   Breast cancer Cousin 73       double first cousin   Breast cancer Cousin 23       paternal cousin   Stroke Other        F 1st degree relative 33, M 1st degree relative   Esophageal cancer Neg Hx    Stomach cancer Neg Hx     Allergies  Allergen Reactions   Other Anaphylaxis and Swelling    Glue (eye lash glue, gorilla glue)   Silodosin Rash    Facial rash   Ace Inhibitors Other (See Comments)    REACTION: angioedema   Buprenorphine Hcl Other (See Comments)    "crazy"   Morphine And Codeine Other (See Comments)    "crazy" "crazy"   Codeine Rash   Sulfonamide Derivatives Rash   Telmisartan Rash    Current Outpatient Medications on File Prior to Visit  Medication Sig Dispense Refill   aspirin EC 81 MG tablet Take 81 mg by mouth daily. Swallow whole.     cholecalciferol (VITAMIN D3) 25 MCG (1000 UNIT) tablet Take 1,000 Units by mouth daily. Patient takes 2000 units     citalopram (CELEXA) 20 MG tablet Take 1 tablet (20 mg total) by mouth every morning. 90 tablet 1   Cyanocobalamin (VITAMIN B-12) 5000 MCG TBDP Take by mouth.     furosemide (LASIX) 40 MG tablet Take 1 tablet (40 mg total) by mouth every morning. 90  tablet 1   meloxicam (MOBIC) 15 MG tablet Take 1 tablet (15 mg total) by mouth daily. (Patient taking differently: Take 15 mg by mouth daily. AS NEEDED FOR SPASMS) 30 tablet 0   omeprazole (PRILOSEC) 40 MG capsule TAKE ONE CAPSULE BY MOUTH ONCE DAILY 90 capsule 3   No current facility-administered medications on file prior to visit.    BP (!) 148/80   Pulse 90   Temp 98 F (36.7 C)   Resp 18   Ht 5\' 5"  (1.651 m)   Wt 187 lb (84.8 kg)   LMP  (LMP Unknown)   SpO2 97%   BMI 31.12 kg/m          Objective:   Physical  Exam  General Mental Status- Alert. General Appearance- Not in acute distress.   Skin General: Color- Normal Color. Moisture- Normal Moisture.  Neck Carotid Arteries- Normal color. Moisture- Normal Moisture. No carotid bruits. No JVD.  Chest and Lung Exam Auscultation: Breath Sounds:-Normal.  Cardiovascular Auscultation:Rythm- Regular. Murmurs & Other Heart Sounds:Auscultation of the heart reveals- No Murmurs.  Abdomen Inspection:-Inspeection Normal. Palpation/Percussion:Note:No mass. Palpation and Percussion of the abdomen reveal- Non Tender, Non Distended + BS, no rebound or guarding.   Neurologic Cranial Nerve exam:- CN III-XII intact(No nystagmus), symmetric smile. Strength:- 5/5 equal and symmetric strength both upper and lower extremities.    Left lower ext- moderate swelling to calf and left knee. Large pretibial bruise present. Left tibia- tender to palpation mid aspect Rt calf not swollen- negative homans signs.  Rt hip- mild tender to palpation and 6 cm circular bruise.     Assessment & Plan:   Patient Instructions  Frequent falls(with pain in tibia, knee and hip) Three falls in eight days linked to balance issues and leg weakness. Left leg swelling and bruising suggest possible fractures or blood clots. Right hip bruised but not painful during ambulation. - Order left knee and tibia-fibula x-rays. - Order left lower extremity ultrasound. - Order right hip x-ray.  Left lower extremity swelling and bruising Significant swelling and bruising in left pretibial area with potential for severe contusion, hematoma, or blood clot. Bruising may indicate low platelet levels or trauma severity. - Order CBC to assess platelet levels. - Order metabolic panel to evaluate kidney function and electrolytes. -lt lower ext [place]  Hypertension Blood pressure elevated at 148/80 mmHg, possibly due to reduced Lasix dosage. Monitoring and lab evaluation needed for management. -  Monitor blood pressure daily for one week.. Give me update by mychart. Plan to forward bp results and lab to your pcp.  Follow up date to be determined after lab, imaging review and bp update review. - Review blood pressure and lab results to determine if adjustments to diuretic or antihypertensive therapy are needed. - Consider low-dose antihypertensive medication such as losartan or valsartan if blood pressure remains elevated and kidney function is stable.  Diuretic management Lasix 20 mg every other day reduced due to dehydration concerns. Hydration status and kidney function to guide diuretic management. Potential combination therapy with low-dose Lasix and antihypertensive based on lab results and blood pressure. - Review metabolic panel results to assess hydration status and kidney function. - Coordinate with Vernona Rieger regarding potential adjustments to diuretic therapy based on lab results and blood pressure readings.   Esperanza Richters, PA-C

## 2023-08-29 ENCOUNTER — Telehealth: Admitting: Family

## 2023-08-29 ENCOUNTER — Encounter: Payer: Self-pay | Admitting: Medical

## 2023-09-05 DIAGNOSIS — M79606 Pain in leg, unspecified: Secondary | ICD-10-CM | POA: Diagnosis not present

## 2023-09-05 DIAGNOSIS — W19XXXS Unspecified fall, sequela: Secondary | ICD-10-CM | POA: Diagnosis not present

## 2023-09-05 DIAGNOSIS — G8929 Other chronic pain: Secondary | ICD-10-CM | POA: Diagnosis not present

## 2023-09-05 DIAGNOSIS — M546 Pain in thoracic spine: Secondary | ICD-10-CM | POA: Diagnosis not present

## 2023-09-05 DIAGNOSIS — M4316 Spondylolisthesis, lumbar region: Secondary | ICD-10-CM | POA: Diagnosis not present

## 2023-09-05 DIAGNOSIS — M6281 Muscle weakness (generalized): Secondary | ICD-10-CM | POA: Diagnosis not present

## 2023-09-05 DIAGNOSIS — Z7409 Other reduced mobility: Secondary | ICD-10-CM | POA: Diagnosis not present

## 2023-09-05 DIAGNOSIS — R269 Unspecified abnormalities of gait and mobility: Secondary | ICD-10-CM | POA: Diagnosis not present

## 2023-09-05 DIAGNOSIS — H538 Other visual disturbances: Secondary | ICD-10-CM | POA: Diagnosis not present

## 2023-09-05 DIAGNOSIS — R293 Abnormal posture: Secondary | ICD-10-CM | POA: Diagnosis not present

## 2023-09-08 DIAGNOSIS — R269 Unspecified abnormalities of gait and mobility: Secondary | ICD-10-CM | POA: Diagnosis not present

## 2023-09-08 DIAGNOSIS — M79606 Pain in leg, unspecified: Secondary | ICD-10-CM | POA: Diagnosis not present

## 2023-09-08 DIAGNOSIS — H538 Other visual disturbances: Secondary | ICD-10-CM | POA: Diagnosis not present

## 2023-09-08 DIAGNOSIS — M4316 Spondylolisthesis, lumbar region: Secondary | ICD-10-CM | POA: Diagnosis not present

## 2023-09-08 DIAGNOSIS — G8929 Other chronic pain: Secondary | ICD-10-CM | POA: Diagnosis not present

## 2023-09-08 DIAGNOSIS — R293 Abnormal posture: Secondary | ICD-10-CM | POA: Diagnosis not present

## 2023-09-08 DIAGNOSIS — M6281 Muscle weakness (generalized): Secondary | ICD-10-CM | POA: Diagnosis not present

## 2023-09-08 DIAGNOSIS — M546 Pain in thoracic spine: Secondary | ICD-10-CM | POA: Diagnosis not present

## 2023-09-08 DIAGNOSIS — Z7409 Other reduced mobility: Secondary | ICD-10-CM | POA: Diagnosis not present

## 2023-09-08 DIAGNOSIS — W19XXXS Unspecified fall, sequela: Secondary | ICD-10-CM | POA: Diagnosis not present

## 2023-09-10 DIAGNOSIS — M4316 Spondylolisthesis, lumbar region: Secondary | ICD-10-CM | POA: Diagnosis not present

## 2023-09-10 DIAGNOSIS — M546 Pain in thoracic spine: Secondary | ICD-10-CM | POA: Diagnosis not present

## 2023-09-10 DIAGNOSIS — R269 Unspecified abnormalities of gait and mobility: Secondary | ICD-10-CM | POA: Diagnosis not present

## 2023-09-10 DIAGNOSIS — Z7409 Other reduced mobility: Secondary | ICD-10-CM | POA: Diagnosis not present

## 2023-09-10 DIAGNOSIS — G8929 Other chronic pain: Secondary | ICD-10-CM | POA: Diagnosis not present

## 2023-09-10 DIAGNOSIS — W19XXXS Unspecified fall, sequela: Secondary | ICD-10-CM | POA: Diagnosis not present

## 2023-09-10 DIAGNOSIS — R293 Abnormal posture: Secondary | ICD-10-CM | POA: Diagnosis not present

## 2023-09-10 DIAGNOSIS — M79606 Pain in leg, unspecified: Secondary | ICD-10-CM | POA: Diagnosis not present

## 2023-09-10 DIAGNOSIS — M6281 Muscle weakness (generalized): Secondary | ICD-10-CM | POA: Diagnosis not present

## 2023-09-10 DIAGNOSIS — H538 Other visual disturbances: Secondary | ICD-10-CM | POA: Diagnosis not present

## 2023-09-16 ENCOUNTER — Ambulatory Visit: Payer: Medicare HMO

## 2023-09-22 DIAGNOSIS — M6281 Muscle weakness (generalized): Secondary | ICD-10-CM | POA: Diagnosis not present

## 2023-09-22 DIAGNOSIS — M4316 Spondylolisthesis, lumbar region: Secondary | ICD-10-CM | POA: Diagnosis not present

## 2023-09-22 DIAGNOSIS — M546 Pain in thoracic spine: Secondary | ICD-10-CM | POA: Diagnosis not present

## 2023-09-22 DIAGNOSIS — W19XXXS Unspecified fall, sequela: Secondary | ICD-10-CM | POA: Diagnosis not present

## 2023-09-22 DIAGNOSIS — M79606 Pain in leg, unspecified: Secondary | ICD-10-CM | POA: Diagnosis not present

## 2023-09-22 DIAGNOSIS — H538 Other visual disturbances: Secondary | ICD-10-CM | POA: Diagnosis not present

## 2023-09-22 DIAGNOSIS — Z7409 Other reduced mobility: Secondary | ICD-10-CM | POA: Diagnosis not present

## 2023-09-22 DIAGNOSIS — G8929 Other chronic pain: Secondary | ICD-10-CM | POA: Diagnosis not present

## 2023-09-22 DIAGNOSIS — R293 Abnormal posture: Secondary | ICD-10-CM | POA: Diagnosis not present

## 2023-09-22 DIAGNOSIS — R269 Unspecified abnormalities of gait and mobility: Secondary | ICD-10-CM | POA: Diagnosis not present

## 2023-09-24 ENCOUNTER — Ambulatory Visit: Admitting: Pharmacist

## 2023-09-24 NOTE — Progress Notes (Deleted)
 Patient ID: [name]                 DOB: March 04, 1946                    MRN: [id number]      HPI: [name] is a 78 y.o. female patient referred to lipid clinic by Dr. Abel [name]. PMH is significant for CAD, HTN, HLD, SVT, breast cancer, prior CVA and fibromyalgia. Hx of statin intolerance. Previously on Repatha , but stopped due to cost.   Reviewed options for lowering LDL cholesterol, including ezetimibe, PCSK-9 inhibitors, bempedoic acid  and inclisiran.  Discussed mechanisms of action, dosing, side effects and potential decreases in LDL cholesterol.  Also reviewed cost information and potential options for patient assistance.   Current Medications: none Intolerances: Atorvastatin  (fatigue) Rosuvastatin  (fatigue) Risk Factors: elevated CAC score, age, HTN, CVA LDL-C goal: <70 (might consider <55) ApoB goal:   Diet:   Exercise:   Family History:   Social History:   Labs: Lipid Panel     Component Value Date/Time   CHOL 153 07/03/2022 1535   TRIG 90 07/03/2022 1535   HDL 50 07/03/2022 1535   CHOLHDL 3.1 07/03/2022 1535   CHOLHDL 4.6 10/27/2020 0050   VLDL 16 10/27/2020 0050   LDLCALC 86 07/03/2022 1535   LDLDIRECT 179.7 02/10/2012 0920   LABVLDL 17 07/03/2022 1535    Past Medical History:  Diagnosis Date   Allergy    Ankle effusion, left 03/23/2020   Arthritis    "spine" (07/28/2014)   Blood dyscrasia    bruises and bleed easily   Breast cancer, left breast (HCC) 07/28/2014   Breast cancer, right breast (HCC) 1995   CHEST XRAY, ABNORMAL 01/04/2008   Qualifier: Diagnosis of  By: Plotnikov MD, Oakley Bellman    Chronic back pain    "all over"   Chronic cough 12/07/2007   Could be related to GERD - relapsed    COAGULOPATHY 07/12/2008   Qualifier: Diagnosis of  By: Kowalk CMA (AAMA), Leisha     Complication of anesthesia    went to sleep easily but hard to wake up up until elbow OR in 2010   CVA (cerebral vascular accident) (HCC)    2022   DDD (degenerative  disc disease), cervical 07/09/2017   DDD (degenerative disc disease), lumbar 07/09/2017   With spinal stenosis.   Dr. Gwendlyn Lemmings   Depression    Dizziness 10/27/2020   Dyspnea    Normal Spirometry 03/2008 EF 65% BNP normal 11/2007   Episode of recurrent major depressive disorder (HCC) 12/05/2017   Essential hypertension 02/27/2007   Mild Off meds 2013  BP Readings from Last 3 Encounters:  02/10/12 128/90  08/09/11 134/62  03/06/11 150/86      Family history of breast cancer    Family history of colon cancer    Family history of pancreatic cancer    Fibromyalgia 02/27/2007   Chronic    Gastric polyps    GERD 02/27/2007   S/p Nissen's 2010    GERD (gastroesophageal reflux disease)    History of chicken pox    History of hiatal hernia    Hx of adenomatous polyp of colon 07/03/2015   Kidney stone    right kidney    Lichen sclerosus    Malignant neoplasm of upper-outer quadrant of left breast in female, 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   Medicare annual wellness visit, subsequent 06/30/2016   Melanoma (HCC) 2010   "right  elbow; treated at [facility]"   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 ([facility])   Mild anxiety    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   Peripheral edema 11/26/2016   Peripheral neuropathy 01/01/2013   Primary osteoarthritis of both hands 07/09/2017   Primary osteoarthritis of both knees 07/09/2017   Chondromalacia patella   Rheumatoid arthritis (HCC) 11/02/2018   S/P laparoscopic fundoplication 08/09/2011   Steatosis of liver 12/07/2009   Qualifier: Diagnosis of  By: Surface RN, Abe Abed     Viral URI with cough 09/11/2014   Vitamin B12 deficiency    VITAMIN B12 DEFICIENCY 02/27/2007   Chronic    Vitamin D  deficiency     Current Outpatient Medications on File Prior to Visit  Medication Sig Dispense Refill   aspirin  EC 81 MG tablet Take 81 mg by mouth daily. Swallow whole.     cholecalciferol (VITAMIN D3) 25 MCG (1000 UNIT) tablet  Take 1,000 Units by mouth daily. Patient takes 2000 units     citalopram  (CELEXA ) 20 MG tablet Take 1 tablet (20 mg total) by mouth every morning. 90 tablet 1   Cyanocobalamin  (VITAMIN B-12) 5000 MCG TBDP Take by mouth.     furosemide  (LASIX ) 40 MG tablet Take 1 tablet (40 mg total) by mouth every morning. 90 tablet 1   meloxicam  (MOBIC ) 15 MG tablet Take 1 tablet (15 mg total) by mouth daily. (Patient taking differently: Take 15 mg by mouth daily. AS NEEDED FOR SPASMS) 30 tablet 0   omeprazole  (PRILOSEC) 40 MG capsule TAKE ONE CAPSULE BY MOUTH ONCE DAILY 90 capsule 3   No current facility-administered medications on file prior to visit.    Allergies  Allergen Reactions   Other Anaphylaxis and Swelling    Glue (eye lash glue, gorilla glue)   Silodosin Rash    Facial rash   Ace Inhibitors Other (See Comments)    REACTION: angioedema   Buprenorphine Hcl Other (See Comments)    "crazy"   Morphine  And Codeine Other (See Comments)    "crazy" "crazy"   Codeine Rash   Sulfonamide Derivatives Rash   Telmisartan Rash    Assessment/Plan:  1. Hyperlipidemia -  No problem-specific Assessment & Plan notes found for this encounter.    Thank you,  [place] Throckmorton D Lizzie An, Pharm.D, BCACP, CPP [facility] [address] [place], [place] [address]  Phone: [phone]; Fax: [phone]

## 2023-10-06 ENCOUNTER — Ambulatory Visit (INDEPENDENT_AMBULATORY_CARE_PROVIDER_SITE_OTHER): Admitting: *Deleted

## 2023-10-06 VITALS — Ht 65.0 in | Wt 187.0 lb

## 2023-10-06 DIAGNOSIS — Z0000 Encounter for general adult medical examination without abnormal findings: Secondary | ICD-10-CM

## 2023-10-06 DIAGNOSIS — Z6831 Body mass index (BMI) 31.0-31.9, adult: Secondary | ICD-10-CM | POA: Diagnosis not present

## 2023-10-06 DIAGNOSIS — E66811 Obesity, class 1: Secondary | ICD-10-CM | POA: Diagnosis not present

## 2023-10-06 NOTE — Progress Notes (Unsigned)
 Subjective:  Please attest this visit in the absence of patient primary care provider.      [name] is a 78 y.o. who presents for a Medicare Wellness preventive visit.  As a reminder, Annual Wellness Visits don't include a physical exam, and some assessments may be limited, especially if this visit is performed virtually. We may recommend an in-person visit if needed.  Visit Complete: Virtual I connected with  [name] on 10/06/23 by a audio enabled telemedicine application and verified that I am speaking with the correct person using two identifiers.  Patient Location: Home  Provider Location: Office/Clinic  I discussed the limitations of evaluation and management by telemedicine. The patient expressed understanding and agreed to proceed.  Vital Signs: Because this visit was a virtual/telehealth visit, some criteria may be missing or patient reported. Any vitals not documented were not able to be obtained and vitals that have been documented are patient reported.  VideoDeclined- This patient declined Librarian, academic. Therefore the visit was completed with audio only.  Persons Participating in Visit: Patient.  AWV Questionnaire: No: Patient Medicare AWV questionnaire was not completed prior to this visit.  Cardiac Risk Factors include: advanced age (>73men, >40 women);hypertension,obesity     Objective:     Today's Vitals   10/06/23 1059 10/06/23 1101  Weight: 187 lb (84.8 kg)   Height: 5\' 5"  (1.651 m)   PainSc:  0-No pain   Body mass index is 31.12 kg/m.     10/06/2023   11:34 AM 07/01/2023    8:01 AM 04/18/2023   10:29 PM 01/24/2023    7:46 AM 09/12/2022    3:13 PM 05/08/2022    8:07 AM 04/24/2022    8:21 AM  Advanced Directives  Does Patient Have a Medical Advance Directive? Yes Yes Yes Yes Yes Yes Yes  Type of Advance Directive Living will;Healthcare Power of [employer] Power of [place];Living will Living will  Healthcare Power of [place];Living will Healthcare Power of [place];Living will Healthcare Power of [place];Living will Healthcare Power of [place];Living will;Out of facility DNR (pink MOST or yellow form)  Does patient want to make changes to medical advance directive? No - Patient declined    No - Patient declined No - Patient declined   Copy of Healthcare Power of Attorney in Chart? Yes - validated most recent copy scanned in chart (See row information)   No - copy requested Yes - validated most recent copy scanned in chart (See row information) Yes - validated most recent copy scanned in chart (See row information)   Would patient like information on creating a medical advance directive?    No - Patient declined       Current Medications (verified) Outpatient Encounter Medications as of 10/06/2023  Medication Sig   aspirin  EC 81 MG tablet Take 81 mg by mouth daily. Swallow whole.   cholecalciferol (VITAMIN D3) 25 MCG (1000 UNIT) tablet Take 1,000 Units by mouth daily. Patient takes 2000 units   citalopram  (CELEXA ) 20 MG tablet Take 1 tablet (20 mg total) by mouth every morning.   Cyanocobalamin  (VITAMIN B-12) 5000 MCG TBDP Take by mouth.   furosemide  (LASIX ) 40 MG tablet Take 1 tablet (40 mg total) by mouth every morning.   meloxicam  (MOBIC ) 15 MG tablet Take 1 tablet (15 mg total) by mouth daily. (Patient taking differently: Take 15 mg by mouth daily. AS NEEDED FOR SPASMS)   omeprazole  (PRILOSEC) 40 MG capsule TAKE ONE CAPSULE BY  MOUTH ONCE DAILY   No facility-administered encounter medications on file as of 10/06/2023.    Allergies (verified) Other, Silodosin, Ace inhibitors, Buprenorphine hcl, Morphine  and codeine, Codeine, Sulfonamide derivatives, and Telmisartan   History: Past Medical History:  Diagnosis Date   Allergy    Ankle effusion, left 03/23/2020   Arthritis    "spine" (07/28/2014)   Blood dyscrasia    bruises and bleed easily   Breast cancer, left breast (HCC)  07/28/2014   Breast cancer, right breast (HCC) 1995   CHEST XRAY, ABNORMAL 01/04/2008   Qualifier: Diagnosis of  By: Plotnikov MD, Oakley Bellman    Chronic back pain    "all over"   Chronic cough 12/07/2007   Could be related to GERD - relapsed    COAGULOPATHY 07/12/2008   Qualifier: Diagnosis of  By: Celestia Colander CMA (AAMA), Leisha     Complication of anesthesia    went to sleep easily but hard to wake up up until elbow OR in 2010   CVA (cerebral vascular accident) (HCC)    2022   DDD (degenerative disc disease), cervical 07/09/2017   DDD (degenerative disc disease), lumbar 07/09/2017   With spinal stenosis.   Dr. Gwendlyn Lemmings   Depression    Dizziness 10/27/2020   Dyspnea    Normal Spirometry 03/2008 EF 65% BNP normal 11/2007   Episode of recurrent major depressive disorder (HCC) 12/05/2017   Essential hypertension 02/27/2007   Mild Off meds 2013  BP Readings from Last 3 Encounters:  02/10/12 128/90  08/09/11 134/62  03/06/11 150/86      Family history of breast cancer    Family history of colon cancer    Family history of pancreatic cancer    Fibromyalgia 02/27/2007   Chronic    Gastric polyps    GERD 02/27/2007   S/p Nissen's 2010    GERD (gastroesophageal reflux disease)    History of chicken pox    History of hiatal hernia    Hx of adenomatous polyp of colon 07/03/2015   Kidney stone    right kidney    Lichen sclerosus    Malignant neoplasm of upper-outer quadrant of left breast in female, 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   Medicare annual wellness visit, subsequent 06/30/2016   Melanoma (HCC) 2010   "right elbow; [facility] at [facility]"   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 ([facility])   Mild anxiety    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   Peripheral edema 11/26/2016   Peripheral neuropathy 01/01/2013   Primary osteoarthritis of both hands 07/09/2017   Primary osteoarthritis of both knees 07/09/2017   Chondromalacia patella   Rheumatoid arthritis  (HCC) 11/02/2018   S/P laparoscopic fundoplication 08/09/2011   Steatosis of liver 12/07/2009   Qualifier: Diagnosis of  By: Surface RN, Donna     Viral URI with cough 09/11/2014   Vitamin B12 deficiency    VITAMIN B12 DEFICIENCY 02/27/2007   Chronic    Vitamin D  deficiency    Past Surgical History:  Procedure Laterality Date   BREAST BIOPSY Left 04/2014   BREAST RECONSTRUCTION WITH PLACEMENT OF TISSUE EXPANDER AND FLEX HD (ACELLULAR HYDRATED DERMIS) Left 07/28/2014   Procedure: LEFT BREAST RECONSTRUCTION PLACEMENT OF LEFT TISSUE EXPANDER ;  Surgeon: Elidia Grout, MD;  Location: [facility] OR;  Service: Plastics;  Laterality: Left;   BREAST RECONSTRUCTION WITH PLACEMENT OF TISSUE EXPANDER AND FLEX HD (ACELLULAR HYDRATED DERMIS) Left 09/08/2014   Procedure: REMOVAL OF TISSUE EXPANDER FROM  LEFT BREAST;  Surgeon: Elidia Grout, MD;  Location: [facility] OR;  Service: Plastics;  Laterality: Left;   BUNIONECTOMY Bilateral 1970's   COLONOSCOPY     Dr Adan Holms   CYSTOSCOPY WITH RETROGRADE PYELOGRAM, URETEROSCOPY AND STENT PLACEMENT Right 06/09/2015   Procedure: CYSTOSCOPY WITH RIGHT RETROGRADE PYELOGRAM, RIGHT URETEROSCOPY AND RIGHT URTERAL STENT PLACEMENT;  Surgeon: Andrez Banker, MD;  Location: [facility] ORS;  Service: Urology;  Laterality: Right;   ESOPHAGOGASTRODUODENOSCOPY     HERNIA REPAIR     HOLMIUM LASER APPLICATION Right 06/09/2015   Procedure: HOLMIUM LASER APPLICATION;  Surgeon: Andrez Banker, MD;  Location: [facility] ORS;  Service: Urology;  Laterality: Right;   LATISSIMUS FLAP TO BREAST Left 09/08/2014   Procedure: LEFT LATISSIMUS FLAP TO BREAST WITH SALINE IMPLANT FOR BREAST RECONSTRUCTION;  Surgeon: Elidia Grout, MD;  Location[facility] OR;  Service: Plastics;  Laterality: Left;   LUMBAR FUSION  01/2018   L5-S1 transitional segmental laminectomy and Fusion   MASTECTOMY Right 1996    chemotherapy. pt. states 13 lymph nodes were removed   MASTECTOMY COMPLETE / SIMPLE W/ SENTINEL NODE BIOPSY Left 07/28/2014    MASTECTOMY W/ SENTINEL NODE BIOPSY Left 07/28/2014   Procedure: LEFT MASTECTOMY WITH SENTINEL LYMPH NODE MAPPING;  Surgeon: Lillette Reid III, MD;  Location: [facility] OR;  Service: General;  Laterality: Left;   MELANOMA EXCISION Right 2010   From elbow-- Done at [facility]    NISSEN FUNDOPLICATION  09/2008   RECONSTRUCTION BREAST IMMEDIATE / DELAYED W/ TISSUE EXPANDER Left 07/28/2014   TEMPOROMANDIBULAR JOINT SURGERY Bilateral 1987   TONSILLECTOMY     Family History  Problem Relation Age of Onset   Stroke Mother    Colon polyps Father        between 10-20   Dementia Father    CAD Father        CABG at age 38   Pancreatic cancer Brother 36   Colon polyps Brother        between 10-20   Anemia Paternal Grandfather        pernicious anemia   Cancer Paternal Uncle        NOS   Colon cancer Cousin 49       double first cousin   Cancer Cousin        breast   Colon cancer Cousin 55       double first cousin   Cancer Cousin    Breast cancer Cousin 70       double first cousin   Stroke Other        F 1st degree relative 64, M 1st degree relative   Esophageal cancer Neg Hx    Stomach cancer Neg Hx    Social History   Socioeconomic History   Marital status: Married    Spouse name: Not on file   Number of children: 2   Years of education: Not on file   Highest education level: Not on file  Occupational History   Occupation: Production designer, theatre/television/film  Tobacco Use   Smoking status: Never   Smokeless tobacco: Never   Tobacco comments:    Regular Exercise - Yes  Vaping Use   Vaping status: Never Used  Substance and Sexual Activity   Alcohol use: No   Drug use: No   Sexual activity: Yes  Other Topics Concern   Not on file  Social History Narrative   She lives with husband.     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         Right Handed    Lives in a one story home with a basement    Social Drivers of Health   Financial Resource Strain: Low Risk   (10/06/2023)   Overall Financial Resource Strain (CARDIA)    Difficulty of Paying Living Expenses: Not very hard  Food Insecurity: No Food Insecurity (10/06/2023)   Hunger Vital Sign    Worried About Running Out of Food in the Last Year: Never true    Ran Out of Food in the Last Year: Never true  Transportation Needs: No Transportation Needs (10/06/2023)   PRAPARE - Administrator, Civil Service (Medical): No    Lack of Transportation (Non-Medical): No  Physical Activity: Unknown (10/06/2023)   Exercise Vital Sign    Days of Exercise per Week: 0 days    Minutes of Exercise per Session: Not on file  Stress: No Stress Concern Present (10/06/2023)   [employer] of Occupational Health - Occupational Stress Questionnaire    Feeling of Stress : Not at all  Social Connections: Socially Integrated (10/06/2023)   Social Connection and Isolation Panel [NHANES]    Frequency of Communication with Friends and Family: More than three times a week    Frequency of Social Gatherings with Friends and Family: Twice a week    Attends Religious Services: More than 4 times per year    Active Member of [employer] or Organizations: Yes    Attends Engineer, structural: More than 4 times per year    Marital Status: Married    Tobacco Counseling Counseling given: Not Answered Tobacco comments: Regular Exercise - Yes    Clinical Intake:  Pre-visit preparation completed: Yes  Pain : No/denies pain Pain Score: 0-No pain     BMI - recorded: 31.12 Nutritional Status: BMI > 30  Obese Nutritional Risks: None Diabetes: No  Lab Results  Component Value Date   HGBA1C 5.3 10/27/2020   HGBA1C 5.6 02/17/2019   HGBA1C 5.2 04/07/2018     How often do you need to have someone help you when you read instructions, pamphlets, or other written materials from your doctor or pharmacy?: 1 - Never What is the last grade level you completed in school?: 12  Interpreter Needed?: No  Information  entered by :: Susa Engman, CMA   Activities of Daily Living     10/06/2023   11:52 AM 10/06/2023   11:05 AM  In your present state of health, do you have any difficulty performing the following activities:  Hearing?  0  Vision?  1  Comment  has cataracts  Difficulty concentrating or making decisions?  0  Walking or climbing stairs?  0  Dressing or bathing?  0  Doing errands, shopping?  0  Preparing Food and eating ? N N  Using the Toilet? N N  In the past six months, have you accidently leaked urine? N Y  Comment  Wears a pad and can't always tell when she needs to urinate. Also on fluid pill.  Do you have problems with loss of bowel control? N N  Managing your Medications? N N  Managing your Finances? N N  Housekeeping or managing your Housekeeping? N N    Patient Care Team: Adra Alanis, FNP as PCP - General (Internal Medicine) Hassan Links, MD as PCP - Cardiology (Cardiology) Loyde Rule, MD (Cardiology) Cyd Dowse, MD as Attending Physician (Obstetrics  and Gynecology) Magrinat, Rozella Cornfield, MD (Inactive) (Hematology and Oncology) Kenney Peacemaker, MD as Consulting Physician (Gastroenterology) Leim Pupa, OD as Referring Physician (Optometry) Caralyn Chandler, MD as Consulting Physician (General Surgery) Andrez Banker, MD as Attending Physician (Urology) Rolan Clerk, MD as Consulting Physician (Dentistry) Ruthell Cowboy (Spine Surgery) Alger Infield, MD as Consulting Physician (Plastic Surgery) Rolando Cliche, [facility] as Pharmacist (Pharmacist) Patel, Donika K, DO as Consulting Physician (Neurology) P.A., [facility] as Consulting Physician (Ophthalmology)  Indicate any recent Medical Services you may have received from other than [facility] providers in the past year (date may be approximate).     Assessment:    This is a routine wellness examination for [place].  Hearing/Vision screen Hearing Screening - Comments::  Denies difficulty Vision Screening - Comments:: Wears glasses, has cataracts. Sees Dr Esaw Heckler.  Last exam 04/2023.   Goals Addressed             This Visit's Progress    Increase physical activity   Not on track    10/06/23  Currently busy in everyday life, has sick son-in-law in [place]  and traveling back and forth for that. Doesn't feel like she has time currently. Has silver sneakers. Plans to use this year.   Walking and water  aerobics         Depression Screen     10/06/2023   11:14 AM 09/12/2022    3:17 PM 06/07/2022    3:32 PM 02/01/2022    2:51 PM 10/05/2021    1:04 PM 09/04/2021    3:14 PM 08/09/2021    2:55 PM  PHQ 2/9 Scores  PHQ - 2 Score 0 2 0 0 0 0 0  PHQ- 9 Score 4 2         Fall Risk     10/06/2023   11:38 AM 07/01/2023    8:01 AM 09/12/2022    3:08 PM 08/22/2022    3:57 PM 06/07/2022    3:32 PM  Fall Risk   Falls in the past year?  0 1 1 1   Number falls in past yr: 1 0 1 1 1   Comment 5 or 6  4 or 5 falls    Injury with Fall? 1 0 1 0 0  Comment   bruises    Risk for fall due to : History of fall(s);Impaired balance/gait  History of fall(s);Impaired balance/gait;Orthopedic patient History of fall(s) History of fall(s)  Risk for fall due to: Comment Pt states she has a balance issues. Currently seeing PT for her back problem      Follow up Education provided Falls evaluation completed Education provided;Falls prevention discussed Falls evaluation completed Falls evaluation completed    MEDICARE RISK AT HOME:  Medicare Risk at Home Any stairs in or around the home?: Yes If so, are there any without handrails?: Yes Home free of loose throw rugs in walkways, pet beds, electrical cords, etc?: No Adequate lighting in your home to reduce risk of falls?: Yes Life alert?: No Use of a cane, walker or w/c?: No (has them if needed) Grab bars in the bathroom?: Yes Shower chair or bench in shower?: No Elevated toilet seat or a handicapped toilet?: Yes   Cognitive  Function: 6CIT completed    02/17/2019   12:24 PM 01/29/2018   11:15 AM 04/06/2015   10:11 AM  MMSE - Mini Mental State Exam  Orientation to time 5 5 5   Orientation to Place 5 5 5   Registration 3 3  3  Attention/ Calculation 5 5 5   Recall 3 2 3   Language- name 2 objects 2 2 2   Language- repeat 1 1 1   Language- follow 3 step command 3 3 3   Language- read & follow direction 1 1 1   Write a sentence 1 1 1   Copy design 1 1 1   Total score 30 29 30         10/06/2023   11:41 AM 09/12/2022    3:15 PM 09/04/2021    3:18 PM  6CIT Screen  What Year? 0 points 0 points 0 points  What month? 0 points 0 points 0 points  What time? 0 points 0 points 0 points  Count back from 20 0 points 0 points 0 points  Months in reverse 0 points 0 points 0 points  Repeat phrase 0 points 0 points 0 points  Total Score 0 points 0 points 0 points    Immunizations Immunization History  Administered Date(s) Administered   Fluad Quad(high Dose 65+) 02/17/2019   Influenza Split 02/06/2011, 02/10/2012   Influenza Whole 04/14/2007   Influenza, High Dose Seasonal PF 04/06/2015, 03/18/2017, 01/29/2018, 04/29/2022   Influenza,inj,Quad PF,6+ Mos 07/29/2014, 02/20/2016   Pneumococcal Conjugate-13 02/20/2016   Pneumococcal Polysaccharide-23 07/29/2014   Tdap 01/03/2013, 05/08/2020   Tetanus 01/04/2008   Zoster Recombinant(Shingrix ) 02/14/2018, 06/02/2018    Screening Tests Health Maintenance  Topic Date Due   COVID-19 Vaccine (1) 10/05/2024 (Originally 01/28/1951)   INFLUENZA VACCINE  12/26/2023   Medicare Annual Wellness (AWV)  10/05/2024   Colonoscopy  01/02/2026   DTaP/Tdap/Td (3 - Td or Tdap) 05/08/2030   Pneumonia Vaccine 25+ Years old  Completed   DEXA SCAN  Completed   Hepatitis C Screening  Completed   Zoster Vaccines- Shingrix   Completed   HPV VACCINES  Aged Out   Meningococcal B Vaccine  Aged Out    Health Maintenance  There are no preventive care reminders to display for this  patient.  Health Maintenance Items Addressed:   Additional Screening:  Vision Screening: Recommended annual ophthalmology exams for early detection of glaucoma and other disorders of the eye.  Dental Screening: Recommended annual dental exams for proper oral hygiene  Community Resource Referral / Chronic Care Management: CRR required this visit?  No   CCM required this visit?  No   Plan:    I have personally reviewed and noted the following in the patient's chart:   Medical and social history Use of alcohol, tobacco or illicit drugs  Current medications and supplements including opioid prescriptions. Patient is not currently taking opioid prescriptions. Functional ability and status Nutritional status Physical activity Advanced directives List of other physicians Hospitalizations, surgeries, and ER visits in previous 12 months Vitals Screenings to include cognitive, depression, and falls Referrals and appointments  In addition, I have reviewed and discussed with patient certain preventive protocols, quality metrics, and best practice recommendations. A written personalized care plan for preventive services as well as general preventive health recommendations were provided to patient.   Susa Engman, CMA   10/08/2023   After Visit Summary: (Declined) Due to this being a telephonic visit, with patients personalized plan was offered to patient but patient Declined AVS at this time   Notes: Nothing significant to report at this time.

## 2023-10-08 NOTE — Patient Instructions (Signed)
 [name] , Thank you for taking time out of your busy schedule to complete your Annual Wellness Visit with me. I enjoyed our conversation and look forward to speaking with you again next year. I, as well as your care team,  appreciate your ongoing commitment to your health goals. Please review the following plan we discussed and let me know if I can assist you in the future. Your Game plan/ To Do List    Referrals: If you haven't heard from the office you've been referred to, please reach out to us  and let us  know  Follow up Visits: Next Medicare AWV with our clinical staff: 10/06/24   Have you seen your provider in the last 6 months (3 months if uncontrolled diabetes)? Yes  Clinician Recommendations:  Aim for 30 minutes of exercise or brisk walking, 6-8 glasses of water , and 5 servings of fruits and vegetables each day.       This is a list of the screening recommended for you and due dates:  Health Maintenance  Topic Date Due   COVID-19 Vaccine (1) 10/05/2024*   Flu Shot  12/26/2023   Medicare Annual Wellness Visit  10/05/2024   Colon Cancer Screening  01/02/2026   DTaP/Tdap/Td vaccine (3 - Td or Tdap) 05/08/2030   Pneumonia Vaccine  Completed   DEXA scan (bone density measurement)  Completed   Hepatitis C Screening  Completed   Zoster (Shingles) Vaccine  Completed   HPV Vaccine  Aged Out   Meningitis B Vaccine  Aged Out  *Topic was postponed. The date shown is not the original due date.    See attachments for Preventive Care and Fall Prevention Tips.

## 2023-10-13 DIAGNOSIS — S40869A Insect bite (nonvenomous) of unspecified upper arm, initial encounter: Secondary | ICD-10-CM | POA: Diagnosis not present

## 2023-10-13 DIAGNOSIS — W57XXXA Bitten or stung by nonvenomous insect and other nonvenomous arthropods, initial encounter: Secondary | ICD-10-CM | POA: Diagnosis not present

## 2023-10-13 DIAGNOSIS — I10 Essential (primary) hypertension: Secondary | ICD-10-CM | POA: Diagnosis not present

## 2023-10-15 ENCOUNTER — Emergency Department (HOSPITAL_BASED_OUTPATIENT_CLINIC_OR_DEPARTMENT_OTHER)
Admission: EM | Admit: 2023-10-15 | Discharge: 2023-10-16 | Disposition: A | Discharge status: Home / Self Care | Attending: Emergency Medicine | Admitting: Emergency Medicine

## 2023-10-15 ENCOUNTER — Other Ambulatory Visit: Payer: Self-pay

## 2023-10-15 ENCOUNTER — Emergency Department (HOSPITAL_BASED_OUTPATIENT_CLINIC_OR_DEPARTMENT_OTHER)

## 2023-10-15 ENCOUNTER — Encounter (HOSPITAL_BASED_OUTPATIENT_CLINIC_OR_DEPARTMENT_OTHER): Payer: Self-pay | Admitting: Emergency Medicine

## 2023-10-15 DIAGNOSIS — R5383 Other fatigue: Secondary | ICD-10-CM | POA: Diagnosis not present

## 2023-10-15 DIAGNOSIS — Z7982 Long term (current) use of aspirin: Secondary | ICD-10-CM | POA: Insufficient documentation

## 2023-10-15 DIAGNOSIS — R519 Headache, unspecified: Secondary | ICD-10-CM | POA: Diagnosis not present

## 2023-10-15 DIAGNOSIS — E876 Hypokalemia: Secondary | ICD-10-CM | POA: Insufficient documentation

## 2023-10-15 DIAGNOSIS — R42 Dizziness and giddiness: Secondary | ICD-10-CM | POA: Diagnosis not present

## 2023-10-15 DIAGNOSIS — I10 Essential (primary) hypertension: Secondary | ICD-10-CM | POA: Diagnosis not present

## 2023-10-15 DIAGNOSIS — R5381 Other malaise: Secondary | ICD-10-CM

## 2023-10-15 DIAGNOSIS — R0789 Other chest pain: Secondary | ICD-10-CM | POA: Diagnosis not present

## 2023-10-15 DIAGNOSIS — Z79899 Other long term (current) drug therapy: Secondary | ICD-10-CM | POA: Diagnosis not present

## 2023-10-15 LAB — BASIC METABOLIC PANEL WITH GFR
Anion gap: 13 (ref 5–15)
BUN: 12 mg/dL (ref 8–23)
CO2: 26 mmol/L (ref 22–32)
Calcium: 9.2 mg/dL (ref 8.9–10.3)
Chloride: 102 mmol/L (ref 98–111)
Creatinine, Ser: 0.86 mg/dL (ref 0.44–1.00)
GFR, Estimated: >60 mL/min (ref >60)
Glucose, Bld: 98 mg/dL (ref 70–99)
Potassium: 3 mmol/L — ABNORMAL LOW (ref 3.5–5.1)
Sodium: 141 mmol/L (ref 135–145)

## 2023-10-15 LAB — CBC
HCT: 42.1 % (ref 36.0–46.0)
Hemoglobin: 14.4 g/dL (ref 12.0–15.0)
MCH: 30.8 pg (ref 26.0–34.0)
MCHC: 34.2 g/dL (ref 30.0–36.0)
MCV: 90 fL (ref 80.0–100.0)
Platelets: 206 10*3/uL (ref 150–400)
RBC: 4.68 MIL/uL (ref 3.87–5.11)
RDW: 13.3 % (ref 11.5–15.5)
WBC: 9.3 10*3/uL (ref 4.0–10.5)
nRBC: 0 % (ref 0.0–0.2)

## 2023-10-15 LAB — TROPONIN T, HIGH SENSITIVITY: Troponin T High Sensitivity: <15 ng/L (ref <19)

## 2023-10-15 MED ORDER — METOCLOPRAMIDE HCL 5 MG/ML IJ SOLN
10.0000 mg | Freq: Once | INTRAMUSCULAR | Status: AC
  Administered 2023-10-15: 10 mg via INTRAVENOUS
  Filled 2023-10-15: qty 2

## 2023-10-15 NOTE — ED Triage Notes (Signed)
 Pt states has been sick all day, worse 2 hours ago, with weakness, chest tightness, elevates blood pressure.

## 2023-10-15 NOTE — ED Provider Notes (Signed)
 [place] [facility] Provider Note   CSN: [id number] Arrival date & time: 10/15/23  2313     History  Chief Complaint  Patient presents with   Hypertension    [name] is a 78 y.o. female.  The history is provided by the patient.  Hypertension  [name] is a 78 y.o. female who presents to the Emergency Department complaining of multiple complaints.  She presents to the emergency department for evaluation of multiple complaints.  She states that she had history of high blood pressure but was taken off of her medications back in March due to side effects from the medication.  She saw her PCP on the 19th and was restarted on ramipril.  She states that when she woke up this morning she developed nausea, fatigue, indigestion in her chest as well as feeling lightheaded, which is described as a blurry off-balance feeling.  She has associated slight frontal headache.  No fever, vomiting, diarrhea, dysuria.  She is not having difficulty breathing but feels stifled in her breathing.  She has a history of chronic back pain, balance problems and high blood pressure.      Home Medications Prior to Admission medications   Medication Sig Start Date End Date Taking? Authorizing Provider  amLODipine  (NORVASC ) 5 MG tablet Take 1 tablet (5 mg total) by mouth daily. 10/16/23  Yes Kelsey Patricia, MD  ondansetron  (ZOFRAN -ODT) 4 MG disintegrating tablet Take 1 tablet (4 mg total) by mouth every 8 (eight) hours as needed. 10/16/23  Yes Kelsey Patricia, MD  potassium chloride  (KLOR-CON ) 20 MEQ packet Take 20 mEq by mouth daily. 10/16/23  Yes Kelsey Patricia, MD  aspirin  EC 81 MG tablet Take 81 mg by mouth daily. Swallow whole.    [provider]  cholecalciferol (VITAMIN D3) 25 MCG (1000 UNIT) tablet Take 1,000 Units by mouth daily. Patient takes 2000 units    [provider]  citalopram  (CELEXA ) 20 MG tablet Take 1 tablet (20 mg total) by  mouth every morning. 06/30/23   Adra Alanis, FNP  Cyanocobalamin  (VITAMIN B-12) 5000 MCG TBDP Take by mouth.    [provider]  furosemide  (LASIX ) 40 MG tablet Take 1 tablet (40 mg total) by mouth every morning. 06/30/23   Adra Alanis, FNP  meloxicam  (MOBIC ) 15 MG tablet Take 1 tablet (15 mg total) by mouth daily. Patient taking differently: Take 15 mg by mouth daily. AS NEEDED FOR SPASMS 09/12/22   Everlina Hock, NP  omeprazole  (PRILOSEC) 40 MG capsule TAKE ONE CAPSULE BY MOUTH ONCE DAILY 10/22/22   Kenney Peacemaker, MD      Allergies    Other, Silodosin, Ace inhibitors, Buprenorphine hcl, Morphine  and codeine, Codeine, Sulfonamide derivatives, and Telmisartan    Review of Systems   Review of Systems  All other systems reviewed and are negative.   Physical Exam Updated Vital Signs BP (!) 177/88   Pulse 67   Temp (!) 97.4 F (36.3 C)   Resp (!) 21   Ht 5\' 5"  (1.651 m)   Wt 84.8 kg   LMP  (LMP Unknown)   SpO2 97%   BMI 31.12 kg/m  Physical Exam Vitals and nursing note reviewed.  Constitutional:      Appearance: She is well-developed.  HENT:     Head: Normocephalic and atraumatic.  Cardiovascular:     Rate and Rhythm: Normal rate and regular rhythm.     Heart sounds: No murmur  heard. Pulmonary:     Effort: Pulmonary effort is normal. No respiratory distress.     Breath sounds: Normal breath sounds.  Abdominal:     Palpations: Abdomen is soft.     Tenderness: There is no abdominal tenderness. There is no guarding or rebound.  Musculoskeletal:        General: No tenderness.  Skin:    General: Skin is warm and dry.  Neurological:     Mental Status: She is alert and oriented to person, place, and time.     Comments: No asymmetry of facial movements. Visual fields grossly intact.  5/5 strength in all four extremities with sensation to light touch intact in all four extremities.    Psychiatric:        Behavior: Behavior normal.     ED Results  / Procedures / Treatments   Labs (all labs ordered are listed, but only abnormal results are displayed) Labs Reviewed  BASIC METABOLIC PANEL WITH GFR - Abnormal; Notable for the following components:      Result Value   Potassium 3.0 (*)    All other components within normal limits  CBC  URINALYSIS, ROUTINE W REFLEX MICROSCOPIC  TROPONIN T, HIGH SENSITIVITY  TROPONIN T, HIGH SENSITIVITY    EKG EKG Interpretation Date/Time:  Wednesday Oct 15 2023 23:24:28 EDT Ventricular Rate:  71 PR Interval:  161 QRS Duration:  83 QT Interval:  418 QTC Calculation: 455 R Axis:   16  Text Interpretation: Sinus rhythm Abnormal R-wave progression, early transition Confirmed by Kelsey Patricia [phone] on 10/15/2023 11:38:28 PM  Radiology CT Head Wo Contrast Result Date: 10/16/2023 CLINICAL DATA:  Headache, dizziness, hypertension EXAM: CT HEAD WITHOUT CONTRAST TECHNIQUE: Contiguous axial images were obtained from the base of the skull through the vertex without intravenous contrast. RADIATION DOSE REDUCTION: This exam was performed according to the departmental dose-optimization program which includes automated exposure control, adjustment of the mA and/or kV according to patient size and/or use of iterative reconstruction technique. COMPARISON:  04/18/2023 FINDINGS: Brain: Normal anatomic configuration. Parenchymal volume loss is commensurate with the patient's age. Mild stable periventricular white matter changes are present likely reflecting the sequela of small vessel ischemia. No abnormal intra or extra-axial mass lesion or fluid collection. No abnormal mass effect or midline shift. No evidence of acute intracranial hemorrhage or infarct. Ventricular size is normal. Cerebellum unremarkable. Vascular: No asymmetric hyperdense vasculature at the skull base. Skull: Intact Sinuses/Orbits: Paranasal sinuses are clear. Orbits are unremarkable. Other: Mastoid air cells and middle ear cavities are clear.  IMPRESSION: 1. No acute intracranial abnormality. 2. Stable mild periventricular white matter changes likely reflecting the sequela of small vessel ischemia. Electronically Signed   By: Worthy Heads M.D.   On: 10/16/2023 00:30   DG Chest 2 View Result Date: 10/16/2023 CLINICAL DATA:  Chest tightness, hypertension EXAM: CHEST - 2 VIEW COMPARISON:  None Available. FINDINGS: Lungs are well expanded, symmetric, and clear. No pneumothorax or pleural effusion. Cardiac size within normal limits. Pulmonary vascularity is normal. Osseous structures are age-appropriate. No acute bone abnormality. Numerous surgical clips are seen within the breast and axilla bilaterally. IMPRESSION: No active cardiopulmonary disease. Electronically Signed   By: Worthy Heads M.D.   On: 10/16/2023 00:23    Procedures Procedures    Medications Ordered in ED Medications  metoCLOPramide (REGLAN) injection 10 mg (10 mg Intravenous Given 10/15/23 2342)  potassium chloride  SA (KLOR-CON  M) CR tablet 40 mEq (40 mEq Oral Given 10/16/23 0011)  potassium chloride  (  KLOR-CON ) packet 20 mEq (20 mEq Oral Given 10/16/23 0120)    ED Course/ Medical Decision Making/ A&P                                 Medical Decision Making Amount and/or Complexity of Data Reviewed Labs: ordered. Radiology: ordered.  Risk Prescription drug management.   Patient with history of hypertension here for evaluation of multiple complaints of feeling unwell.  She is nontoxic-appearing on evaluation with no focal neurologic deficits.  She is hypertensive but on provision of her home blood pressures her blood pressure here is similar to priors over the last several months.  CT head is without acute changes.  Renal function with hypokalemia, otherwise within normal limits.  UA is not consistent with UTI.  She did have some chest discomfort, EKG is nonischemic and troponins are negative x 2.  She was treated with metoclopramide for her symptoms and her  headache, nausea resolved.  She did have difficulty swallowing in the potassium and did vomit this back up.  She states it was due to the size of the pill.  Will prescribe potassium packet to take while she takes her furosemide .  Will change her lisinopril to amlodipine  as she does have a history of angioedema in the record.  Discussed that she will need to follow-up closely with her PCP for recheck.  Presentation is not consistent with CVA, hypertensive urgency, temporal arteritis, ACS, dissection, PE.  Patient feeling improved on time of reevaluation, able to tolerate p.o.        Final Clinical Impression(s) / ED Diagnoses Final diagnoses:  Primary hypertension  Hypokalemia  Malaise and fatigue    Rx / DC Orders ED Discharge Orders          Ordered    amLODipine  (NORVASC ) 5 MG tablet  Daily        10/16/23 0046    potassium chloride  (KLOR-CON ) 20 MEQ packet  Daily        10/16/23 0204    ondansetron  (ZOFRAN -ODT) 4 MG disintegrating tablet  Every 8 hours PRN        10/16/23 0204              Kelsey Patricia, MD 10/16/23 [phone]

## 2023-10-16 DIAGNOSIS — R0789 Other chest pain: Secondary | ICD-10-CM | POA: Diagnosis not present

## 2023-10-16 DIAGNOSIS — R519 Headache, unspecified: Secondary | ICD-10-CM | POA: Diagnosis not present

## 2023-10-16 DIAGNOSIS — I10 Essential (primary) hypertension: Secondary | ICD-10-CM | POA: Diagnosis not present

## 2023-10-16 DIAGNOSIS — R42 Dizziness and giddiness: Secondary | ICD-10-CM | POA: Diagnosis not present

## 2023-10-16 LAB — URINALYSIS, ROUTINE W REFLEX MICROSCOPIC
Bilirubin Urine: NEGATIVE
Glucose, UA: NEGATIVE mg/dL
Hgb urine dipstick: NEGATIVE
Ketones, ur: NEGATIVE mg/dL
Leukocytes,Ua: NEGATIVE
Nitrite: NEGATIVE
Protein, ur: NEGATIVE mg/dL
Specific Gravity, Urine: 1.02 (ref 1.005–1.030)
pH: 7 (ref 5.0–8.0)

## 2023-10-16 LAB — TROPONIN T, HIGH SENSITIVITY: Troponin T High Sensitivity: <15 ng/L (ref <19)

## 2023-10-16 MED ORDER — AMLODIPINE BESYLATE 5 MG PO TABS: 5.0000 mg | ORAL_TABLET | Freq: Every day | ORAL | 1 refills | Status: DC

## 2023-10-16 MED ORDER — ONDANSETRON 4 MG PO TBDP: 4.0000 mg | ORAL_TABLET | Freq: Three times a day (TID) | ORAL | 0 refills | Status: DC | PRN

## 2023-10-16 MED ORDER — POTASSIUM CHLORIDE 20 MEQ PO PACK: 20.0000 meq | PACK | Freq: Every day | ORAL | 0 refills | Status: DC

## 2023-10-16 MED ORDER — POTASSIUM CHLORIDE 20 MEQ PO PACK
20.0000 meq | PACK | Freq: Once | ORAL | Status: AC
  Administered 2023-10-16: 20 meq via ORAL
  Filled 2023-10-16: qty 1

## 2023-10-16 MED ORDER — POTASSIUM CHLORIDE CRYS ER 20 MEQ PO TBCR
40.0000 meq | EXTENDED_RELEASE_TABLET | Freq: Once | ORAL | Status: AC
  Administered 2023-10-16: 40 meq via ORAL
  Filled 2023-10-16: qty 2

## 2023-10-16 NOTE — ED Notes (Signed)
 Pt felt like her medication got stuck in her throat again and vomited up what appeared to be most of her oral potassium repletion. RN offered to request nausea medication and pt declined, stating that she feels it is due to her esophagus (prior Nissen procedure) and not due to true nausea. MD notified.

## 2023-10-16 NOTE — ED Notes (Signed)
 Pt attempted to swallow K+ pills but was unable to do so, stating that it felt like they got stuck at the bottom of her esophagus. She vomited up half a tablet and did not attempt to swallow the remaining 1.5 tablets ordered.

## 2023-10-16 NOTE — ED Notes (Signed)
 After a few minutes, pt was able to swallow her medicine and took the remainder of the scheduled dose.

## 2023-11-17 ENCOUNTER — Ambulatory Visit: Attending: Internal Medicine | Admitting: Pharmacist

## 2023-11-17 DIAGNOSIS — E785 Hyperlipidemia, unspecified: Secondary | ICD-10-CM | POA: Insufficient documentation

## 2023-11-17 DIAGNOSIS — E7800 Pure hypercholesterolemia, unspecified: Secondary | ICD-10-CM

## 2023-11-17 DIAGNOSIS — I10 Essential (primary) hypertension: Secondary | ICD-10-CM | POA: Diagnosis not present

## 2023-11-17 LAB — LIPID PANEL

## 2023-11-17 NOTE — Progress Notes (Signed)
 Patient ID: [name]                 DOB: 04-Mar-1946                      MRN: [id number]      HPI: [name] is a 78 y.o. female referred by Dr. Verlin to PharmD clinic. PMH is significant for CAD, HTN, HLD, SVT, breast cancer, prior CVA and fibromyalgia. Coronary artery calcium  score 63 Agatston units. This places the patient in the 55th percentile for age and gender, suggesting intermediate risk. She was previously seen by lipid clinic and started on Repatha . This was stopped due to cost.  When she saw Dr.McAlhany is March she has stopped her amlodipine  and isosorbide  because her rx had run out. Her BP was normal so these were stopped.  However her blood pressure had increased and amlodipine  5 mg daily eventually was resumed.  Of note the stroke listed in patient's chart was a questionable stroke.  CT showed possible stroke however MRI did not show a stroke.  Per patient she said a neurologist told her she did not have a stroke.  Patient presents today to Pharm.D. clinic.  She reports her blood pressure at home 135/80s.  She reports feeling dizzy if her blood pressure is any lower.  She is also reporting spells of feeling dizzy and breaking out in a cold sweat if she does anything for longer than 30 minutes.  She has been suffering from terrible heartburn.  Recently stopped her omeprazole  and started famotidine  20 mg daily.  Reports that this has been helping.  She has been advised that she can increase this to twice a day.  Already has an appointment with GI doctor.  Potassium was low when she was seen in the ER recently.  She was given a few packets of potassium but no follow-up since.  She has been drinking about 3 light body armor's per day because it has potassium in it.  Current HTN meds: amlodipine  5mg  daily Current Lipid meds: None Lipid intolerances: atorvastatin , rosuvastatin  (fatigue) Risk Factors: ?  Stroke, hypertension, age, coronary calcium  score 63 55th  percentile Previously HTN meds tried: telmisartan (rash), ACE (angioedema) BP goal: <130/80 LDL-goal: <70  Family History:  Family History  Problem Relation Age of Onset   Stroke Mother    Colon polyps Father        between 10-20   Dementia Father    CAD Father        CABG at age 35   Pancreatic cancer Brother 73   Colon polyps Brother        between 10-20   Anemia Paternal Grandfather        pernicious anemia   Cancer Paternal Uncle        NOS   Colon cancer Cousin 31       double first cousin   Cancer Cousin        breast   Colon cancer Cousin 63       double first cousin   Cancer Cousin    Breast cancer Cousin 53       double first cousin   Stroke Other        F 1st degree relative 75, M 1st degree relative   Esophageal cancer Neg Hx    Stomach cancer Neg Hx      Social History:  Social History   Socioeconomic History   Marital  status: Married    Spouse name: Not on file   Number of children: 2   Years of education: Not on file   Highest education level: Not on file  Occupational History   Occupation: Production designer, theatre/television/film  Tobacco Use   Smoking status: Never   Smokeless tobacco: Never   Tobacco comments:    Regular Exercise - Yes  Vaping Use   Vaping status: Never Used  Substance and Sexual Activity   Alcohol use: No   Drug use: No   Sexual activity: Yes  Other Topics Concern   Not on file  Social History Narrative   She lives with husband.     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         Right Handed    Lives in a one story home with a basement    Social Drivers of Health   Financial Resource Strain: Low Risk  (10/06/2023)   Overall Financial Resource Strain (CARDIA)    Difficulty of Paying Living Expenses: Not very hard  Food Insecurity: No Food Insecurity (10/06/2023)   Hunger Vital Sign    Worried About Running Out of Food in the Last Year: Never true    Ran Out of Food in the Last Year:  Never true  Transportation Needs: No Transportation Needs (10/06/2023)   PRAPARE - Administrator, Civil Service (Medical): No    Lack of Transportation (Non-Medical): No  Physical Activity: Unknown (10/06/2023)   Exercise Vital Sign    Days of Exercise per Week: 0 days    Minutes of Exercise per Session: Not on file  Stress: No Stress Concern Present (10/06/2023)   [employer] of Occupational Health - Occupational Stress Questionnaire    Feeling of Stress : Not at all  Social Connections: Socially Integrated (10/06/2023)   Social Connection and Isolation Panel    Frequency of Communication with Friends and Family: More than three times a week    Frequency of Social Gatherings with Friends and Family: Twice a week    Attends Religious Services: More than 4 times per year    Active Member of Clubs or Organizations: Yes    Attends Banker Meetings: More than 4 times per year    Marital Status: Married  Catering manager Violence: Not At Risk (10/06/2023)   Humiliation, Afraid, Rape, and Kick questionnaire    Fear of Current or Ex-Partner: No    Emotionally Abused: No    Physically Abused: No    Sexually Abused: No     Diet: Reports she does not eat all that often  Exercise:  Not much energy lately  Home BP readings: 135/80s   Wt Readings from Last 3 Encounters:  10/15/23 186 lb 15.9 oz (84.8 kg)  10/06/23 187 lb (84.8 kg)  08/28/23 187 lb (84.8 kg)   BP Readings from Last 3 Encounters:  11/17/23 124/80  10/16/23 (!) 177/88  08/28/23 (!) 148/80   Pulse Readings from Last 3 Encounters:  11/17/23 70  10/16/23 67  08/28/23 90    Renal function: CrCl cannot be calculated (Patient's most recent lab result is older than the maximum 21 days allowed.).  Past Medical History:  Diagnosis Date   Allergy    Ankle effusion, left 03/23/2020   Arthritis    spine (07/28/2014)   Blood dyscrasia    bruises and bleed easily   Breast cancer, left  breast (HCC) 07/28/2014  Breast cancer, right breast (HCC) 1995   CHEST XRAY, ABNORMAL 01/04/2008   Qualifier: Diagnosis of  By: Plotnikov MD, Karlynn GAILS    Chronic back pain    all over   Chronic cough 12/07/2007   Could be related to GERD - relapsed    COAGULOPATHY 07/12/2008   Qualifier: Diagnosis of  By: Kowalk CMA (AAMA), Leisha     Complication of anesthesia    went to sleep easily but hard to wake up up until elbow OR in 2010   CVA (cerebral vascular accident) (HCC)    2022   DDD (degenerative disc disease), cervical 07/09/2017   DDD (degenerative disc disease), lumbar 07/09/2017   With spinal stenosis.   Dr. Malcolm   Depression    Dizziness 10/27/2020   Dyspnea    Normal Spirometry 03/2008 EF 65% BNP normal 11/2007   Episode of recurrent major depressive disorder (HCC) 12/05/2017   Essential hypertension 02/27/2007   Mild Off meds 2013  BP Readings from Last 3 Encounters:  02/10/12 128/90  08/09/11 134/62  03/06/11 150/86      Family history of breast cancer    Family history of colon cancer    Family history of pancreatic cancer    Fibromyalgia 02/27/2007   Chronic    Gastric polyps    GERD 02/27/2007   S/p Nissen's 2010    GERD (gastroesophageal reflux disease)    History of chicken pox    History of hiatal hernia    Hx of adenomatous polyp of colon 07/03/2015   Kidney stone    right kidney    Lichen sclerosus    Malignant neoplasm of upper-outer quadrant of left breast in female, 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   Medicare annual wellness visit, subsequent 06/30/2016   Melanoma (HCC) 2010   right elbow; [facility] at [facility]   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 ([facility])   Mild anxiety    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   Peripheral edema 11/26/2016   Peripheral neuropathy 01/01/2013   Primary osteoarthritis of both hands 07/09/2017   Primary osteoarthritis of both knees 07/09/2017   Chondromalacia patella    Rheumatoid arthritis (HCC) 11/02/2018   S/P laparoscopic fundoplication 08/09/2011   Steatosis of liver 12/07/2009   Qualifier: Diagnosis of  By: Surface RN, Donna     Viral URI with cough 09/11/2014   Vitamin B12 deficiency    VITAMIN B12 DEFICIENCY 02/27/2007   Chronic    Vitamin D  deficiency     Current Outpatient Medications on File Prior to Visit  Medication Sig Dispense Refill   amLODipine  (NORVASC ) 5 MG tablet Take 1 tablet (5 mg total) by mouth daily. 30 tablet 1   aspirin  EC 81 MG tablet Take 81 mg by mouth daily. Swallow whole.     cholecalciferol (VITAMIN D3) 25 MCG (1000 UNIT) tablet Take 1,000 Units by mouth daily. Patient takes 2000 units     citalopram  (CELEXA ) 20 MG tablet Take 1 tablet (20 mg total) by mouth every morning. 90 tablet 1   Cyanocobalamin  (VITAMIN B-12) 5000 MCG TBDP Take by mouth.     famotidine  (PEPCID ) 20 MG tablet Take 20 mg by mouth daily.     furosemide  (LASIX ) 40 MG tablet Take 1 tablet (40 mg total) by mouth every morning. (Patient taking differently: Take 40 mg by mouth every other day.) 90 tablet 1   potassium chloride  (KLOR-CON ) 20 MEQ packet Take 20 mEq by mouth  daily. 7 packet 0   No current facility-administered medications on file prior to visit.    Allergies  Allergen Reactions   Other Anaphylaxis and Swelling    Glue (eye lash glue, gorilla glue)   Silodosin Rash    Facial rash   Ace Inhibitors Other (See Comments)    REACTION: angioedema   Buprenorphine Hcl Other (See Comments)    crazy   Morphine  And Codeine Other (See Comments)    crazy crazy   Codeine Rash   Sulfonamide Derivatives Rash   Telmisartan Rash    Blood pressure 124/80, pulse 70.   Assessment/Plan:     1. Hypertension -  Essential hypertension Assessment: Blood pressure well-controlled in clinic today Patient tolerating amlodipine  5 mg daily Reporting dizziness that almost sounds more like hypoglycemia than hypotension-I recommended that she eat  more consistently Denies any low blood pressures Has checked her blood pressure a few times when she felt sweaty and dizzy reports her blood pressure not low mildly high Potassium has been low-recheck needed Has not been exercising due to lack of energy potentially due to some depressive symptoms, and due to feeling dizzy after about 30 minutes of being on her feet  Plan: Continue amlodipine  5 mg daily She takes furosemide  about every other day Check BMP today  HLD (hyperlipidemia) Assessment: Last lipid panel was over a-year-old-patient unsure what medication she was on at the time Currently on no lipid therapy Per patient she has had some providers tell her she never had a stroke Her current calcium  score was 63 which put her in the 55th percentile Patient prefer not to take any cholesterol medications, she does understand that they are preventative but really wishes not to take Was on Repatha  previously but stopped due to cost-she should qualify for healthwell grant this year Patient requested to check an updated lipid panel  Plan: Recheck lipid panel today Can calculate her Mesa risk score and discuss labs tomorrow   Thank you  Trejan Buda D Jaelin Devincentis, Pharm.[name], CPP [facility] [address] [place], [place] [address]  Phone: [phone]; Fax: [phone]

## 2023-11-17 NOTE — Assessment & Plan Note (Addendum)
 Assessment: Last lipid panel was over a-year-old-patient unsure what medication she was on at the time Currently on no lipid therapy Per patient she has had some providers tell her she never had a stroke Her current calcium  score was 63 which put her in the 55th percentile Patient prefer not to take any cholesterol medications, she does understand that they are preventative but really wishes not to take Was on Repatha  previously but stopped due to cost-she should qualify for healthwell grant this year Patient requested to check an updated lipid panel  Plan: Recheck lipid panel today Can calculate her Mesa risk score and discuss labs tomorrow

## 2023-11-17 NOTE — Assessment & Plan Note (Addendum)
 Assessment: Blood pressure well-controlled in clinic today Patient tolerating amlodipine  5 mg daily Reporting dizziness that almost sounds more like hypoglycemia than hypotension-I recommended that she eat more consistently Denies any low blood pressures Has checked her blood pressure a few times when she felt sweaty and dizzy reports her blood pressure not low mildly high Potassium has been low-recheck needed Has not been exercising due to lack of energy potentially due to some depressive symptoms, and due to feeling dizzy after about 30 minutes of being on her feet  Plan: Continue amlodipine  5 mg daily She takes furosemide  about every other day Check BMP today

## 2023-11-17 NOTE — Patient Instructions (Addendum)
 Please continue amlodipine  5mg  daily  We will check your electrolytes and cholesterol today  Please send me mychart message or call me at [phone] with any questions

## 2023-11-18 ENCOUNTER — Telehealth: Payer: Self-pay | Admitting: Pharmacy Technician

## 2023-11-18 ENCOUNTER — Other Ambulatory Visit (HOSPITAL_BASED_OUTPATIENT_CLINIC_OR_DEPARTMENT_OTHER): Payer: Self-pay

## 2023-11-18 ENCOUNTER — Ambulatory Visit: Payer: Self-pay | Admitting: Pharmacist

## 2023-11-18 ENCOUNTER — Other Ambulatory Visit (HOSPITAL_COMMUNITY): Payer: Self-pay

## 2023-11-18 DIAGNOSIS — E7800 Pure hypercholesterolemia, unspecified: Secondary | ICD-10-CM

## 2023-11-18 LAB — LIPID PANEL
Cholesterol, Total: 223 mg/dL — ABNORMAL HIGH (ref 100–199)
HDL: 40 mg/dL (ref >39)
LDL CALC COMMENT:: 5.6 ratio — ABNORMAL HIGH (ref 0.0–4.4)
LDL Chol Calc (NIH): 164 mg/dL — ABNORMAL HIGH (ref 0–99)
Triglycerides: 104 mg/dL (ref 0–149)
VLDL Cholesterol Cal: 19 mg/dL (ref 5–40)

## 2023-11-18 LAB — COMPREHENSIVE METABOLIC PANEL WITH GFR
ALT: 21 IU/L (ref 0–32)
AST: 24 IU/L (ref 0–40)
Albumin: 4.3 g/dL (ref 3.8–4.8)
Alkaline Phosphatase: 69 IU/L (ref 44–121)
BUN/Creatinine Ratio: 19 (ref 12–28)
BUN: 20 mg/dL (ref 8–27)
Bilirubin Total: 0.6 mg/dL (ref 0.0–1.2)
CO2: 24 mmol/L (ref 20–29)
Calcium: 9.2 mg/dL (ref 8.7–10.3)
Chloride: 105 mmol/L (ref 96–106)
Creatinine, Ser: 1.07 mg/dL — ABNORMAL HIGH (ref 0.57–1.00)
Globulin, Total: 2.1 g/dL (ref 1.5–4.5)
Glucose: 79 mg/dL (ref 70–99)
Potassium: 4 mmol/L (ref 3.5–5.2)
Sodium: 145 mmol/L — ABNORMAL HIGH (ref 134–144)
Total Protein: 6.4 g/dL (ref 6.0–8.5)
eGFR: 53 mL/min/{1.73_m2} — ABNORMAL LOW (ref >59)

## 2023-11-18 MED ORDER — REPATHA SURECLICK 140 MG/ML ~~LOC~~ SOAJ
1.0000 mL | SUBCUTANEOUS | 3 refills | Status: AC
  Filled 2023-11-18: qty 6, 84d supply, fill #0
  Filled 2024-03-24 – 2024-05-11 (×2): qty 6, 84d supply, fill #1

## 2023-11-18 NOTE — Addendum Note (Signed)
 Addended by: Johnny Gorter D on: 11/18/2023 04:21 PM   Modules accepted: Orders

## 2023-11-18 NOTE — Telephone Encounter (Signed)
 I called patient just now to make her aware the pharmacy has her prescription ready now for free. Patient to pickup

## 2023-11-18 NOTE — Telephone Encounter (Signed)
 Sent this message to [facility], I called the patient to notify her that the prior authorization is approved and her grant is approved. She said she lives near our pharmacy at [address], [address], [place], [place] [address] if the repatha  can be sent [facility] please? The healthwell grant information is in [place] and I am sending her a mychart so she has the [employer] information as well. Thank you

## 2023-11-18 NOTE — Telephone Encounter (Signed)
 Patient Advocate Encounter   The patient was approved for a Healthwell grant that will help cover the cost of repatha  Total amount awarded, 2500.  Effective: 10/19/23 - 10/17/24   APW:[id number] ERW:[id number] Hmnle:[id number]  PI:[id number] Healthwell ID: [id number]   Pharmacy provided with approval and processing information. Patient informed via telephone and mychart

## 2023-11-18 NOTE — Telephone Encounter (Signed)
 Please do PA for Repatha - see my note from yesterday If approved, please get healthwell grant- add to [facility] just incase pt agreeable to using [facility]

## 2023-11-25 ENCOUNTER — Telehealth: Payer: Self-pay | Admitting: Internal Medicine

## 2023-11-25 NOTE — Telephone Encounter (Signed)
 The pt has worse GERD and night time water  brash.  She was taking omeprazole  twice daily but stopped because she thinks it was not working and added Pepcid .  I did advise that she go back on omeprazole  twice daily and add pepcid  at bedtime.  Anti reflux precautions have also been discussed.   She has been set up for office visit with Dr Avram.  She will call back if the recommendations do not help or get worse in the meantime.

## 2023-11-25 NOTE — Telephone Encounter (Signed)
 Patient called and stated that her GERD is severe and she is feeling like some liquid is bubbling up in her throat. Please advise.

## 2023-12-18 DIAGNOSIS — H524 Presbyopia: Secondary | ICD-10-CM | POA: Diagnosis not present

## 2023-12-31 ENCOUNTER — Ambulatory Visit: Admitting: Internal Medicine

## 2023-12-31 ENCOUNTER — Encounter: Payer: Self-pay | Admitting: Internal Medicine

## 2023-12-31 VITALS — BP 124/72 | HR 79 | Ht 65.0 in | Wt 181.0 lb

## 2023-12-31 DIAGNOSIS — K224 Dyskinesia of esophagus: Secondary | ICD-10-CM

## 2023-12-31 DIAGNOSIS — Z9889 Other specified postprocedural states: Secondary | ICD-10-CM

## 2023-12-31 DIAGNOSIS — K219 Gastro-esophageal reflux disease without esophagitis: Secondary | ICD-10-CM | POA: Diagnosis not present

## 2023-12-31 DIAGNOSIS — R1310 Dysphagia, unspecified: Secondary | ICD-10-CM

## 2023-12-31 DIAGNOSIS — R6881 Early satiety: Secondary | ICD-10-CM | POA: Diagnosis not present

## 2023-12-31 DIAGNOSIS — R1319 Other dysphagia: Secondary | ICD-10-CM

## 2023-12-31 NOTE — Patient Instructions (Signed)
 You have been scheduled for an endoscopy. Please follow written instructions given to you at your visit today.  If you use inhalers (even only as needed), please bring them with you on the day of your procedure.  If you take any of the following medications, they will need to be adjusted prior to your procedure:   DO NOT TAKE 7 DAYS PRIOR TO TEST- Trulicity (dulaglutide) Ozempic, Wegovy (semaglutide) Mounjaro (tirzepatide) Bydureon Bcise (exanatide extended release)  DO NOT TAKE 1 DAY PRIOR TO YOUR TEST Rybelsus (semaglutide) Adlyxin (lixisenatide) Victoza (liraglutide) Byetta (exanatide) ___________________________________________________________________________   I appreciate the opportunity to care for you. Stan Head, MD, [facility]

## 2023-12-31 NOTE — Progress Notes (Signed)
 [name] 78 y.o. 28-Nov-1945 [id number]  Assessment & Plan:   Encounter Diagnoses  Name Primary?   Esophageal dysphagia Yes   Gastroesophageal reflux disease, unspecified whether esophagitis present    History of Nissen fundoplication    Early satiety    Esophageal dysmotility    Cause of these problems is not clear to me at this time.  She could have had further slippage and recurrence of hiatal hernia status post fundoplication.  However chest x-ray in May did not show hiatal hernia.  Esophageal dysmotility certainly could be in play, she could have stricture effect.  Upper GI tract malignancy could have developed.  Evaluate with EGD, possible esophageal dilation at that time.The risks and benefits as well as alternatives of endoscopic procedure(s) have been discussed and reviewed. All questions answered. The patient agrees to proceed.   Continue twice daily omeprazole  20 mg.  May need dose titration upward.    Subjective:   Chief Complaint: Dysphagia, regurgitation early satiety  Patient consented to the use of artificial intelligence scribe  HPI [name] is a 78 year old female with GERD and esophageal dysmotility status post Nissen fundoplication who presents with swallowing difficulties and heartburn.  She experiences persistent swallowing difficulties, heartburn, and occasional vomiting. There is a sensation of fullness after consuming small amounts of food, such as half a bacon biscuit, and a burning sensation in her chest, which she refers to as heartburn. These symptoms have impacted her appetite, leading to a decreased desire to eat.  She experiences regurgitation, particularly when leaning over after drinking fluids, and has noticed a recurrence of a cough, which she associates with regurgitation.   She has been taking over-the-counter omeprazole  twice daily, once in the morning before breakfast and once at night before dinner, after switching from  pantoprazole . Despite this, she continues to experience symptoms. She has also been sleeping on a wedge and avoiding eating after 7 PM to manage her symptoms.  She reports a recent episode in May of this year where she was transported to the emergency department due to elevated blood pressure, weakness, and chest tightness, which was attributed to dehydration and low potassium levels. Her potassium levels have since normalized.  I have reviewed these records.  Troponin was negative.    She regularly sees a cardiologist and had a normal ejection fraction on echocardiogram in June 2022.  Wt Readings from Last 3 Encounters:  12/31/23 181 lb (82.1 kg)  10/15/23 186 lb 15.9 oz (84.8 kg)  10/06/23 187 lb (84.8 kg)     EGD 10/17/2022 - Benign-appearing esophageal stenosis. Subtle stenosis Dilated 54 Fr as last year with similar result - A Nissen fundoplication was found. The wrap appears loose. - The examination was otherwise normal. - No specimens collected.  EGD 11/20/2021 Tortuous esophagus. - LA Grade A reflux esophagitis. Biopsied. - A Nissen fundoplication was found. The wrap appears slightly loose but overall intact - ? sliding component - Erosive gastropathy with no bleeding and no stigmata of recent bleeding. Single erosion in cardia on the wrap. Unable to biopsy this (position) looks benign. - The examination was otherwise normal. - Dilation performed in the upper third of the esophagus. 54 Fr w/ mucosal disruption - presumably corresponding to stenosis seen on ba swallow.  Barium swallow 11/12/2021 IMPRESSION: 1. Focal area of narrowing in the upper esophagus without evidence of obstruction. The 13 mm barium tablet passes through this segment.   2.  Moderate-severe esophageal dysmotility.  3. Recurrent, small hiatal hernia status post Nissen fundoplication.   Allergies  Allergen Reactions   Other Anaphylaxis and Swelling    Glue (eye lash glue, gorilla glue)   Silodosin Rash     Facial rash   Ace Inhibitors Other (See Comments)    REACTION: angioedema   Buprenorphine Hcl Other (See Comments)    crazy   Morphine  And Codeine Other (See Comments)    crazy crazy   Codeine Rash   Sulfonamide Derivatives Rash   Telmisartan Rash   Current Meds  Medication Sig   amLODipine  (NORVASC ) 5 MG tablet Take 1 tablet (5 mg total) by mouth daily.   aspirin  EC 81 MG tablet Take 81 mg by mouth daily. Swallow whole.   cholecalciferol (VITAMIN D3) 25 MCG (1000 UNIT) tablet Take 1,000 Units by mouth daily. Patient takes 2000 units   citalopram  (CELEXA ) 20 MG tablet Take 1 tablet (20 mg total) by mouth every morning.   Cyanocobalamin  (VITAMIN B-12) 5000 MCG TBDP Take by mouth.   Evolocumab  (REPATHA  SURECLICK) 140 MG/ML SOAJ Inject 140 mg into the skin every 14 (fourteen) days. Please apply healthwell grant   famotidine  (PEPCID ) 20 MG tablet Take 20 mg by mouth daily.   furosemide  (LASIX ) 40 MG tablet Take 1 tablet (40 mg total) by mouth every morning. (Patient taking differently: Take 40 mg by mouth every other day.)   omeprazole  (PRILOSEC) 20 MG capsule Take 20 mg by mouth 2 (two) times daily before a meal.   Past Medical History:  Diagnosis Date   Allergy    Ankle effusion, left 03/23/2020   Arthritis    spine (07/28/2014)   Blood dyscrasia    bruises and bleed easily   Breast cancer, left breast (HCC) 07/28/2014   Breast cancer, right breast (HCC) 1995   CHEST XRAY, ABNORMAL 01/04/2008   Qualifier: Diagnosis of  By: Plotnikov MD, Karlynn GAILS    Chronic back pain    all over   Chronic cough 12/07/2007   Could be related to GERD - relapsed    COAGULOPATHY 07/12/2008   Qualifier: Diagnosis of  By: Genie CMA (AAMA), Leisha     Complication of anesthesia    went to sleep easily but hard to wake up up until elbow OR in 2010   CVA (cerebral vascular accident) (HCC)    2022   DDD (degenerative disc disease), cervical 07/09/2017   DDD (degenerative disc disease),  lumbar 07/09/2017   With spinal stenosis.   Dr. Malcolm   Depression    Dizziness 10/27/2020   Dyspnea    Normal Spirometry 03/2008 EF 65% BNP normal 11/2007   Episode of recurrent major depressive disorder (HCC) 12/05/2017   Essential hypertension 02/27/2007   Mild Off meds 2013  BP Readings from Last 3 Encounters:  02/10/12 128/90  08/09/11 134/62  03/06/11 150/86      Family history of breast cancer    Family history of colon cancer    Family history of pancreatic cancer    Fibromyalgia 02/27/2007   Chronic    Gastric polyps    GERD 02/27/2007   S/p Nissen's 2010    GERD (gastroesophageal reflux disease)    History of chicken pox    History of hiatal hernia    Hx of adenomatous polyp of colon 07/03/2015   Kidney stone    right kidney    Lichen sclerosus    Malignant neoplasm of upper-outer quadrant of left breast in female, 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   Medicare annual wellness visit, subsequent 06/30/2016   Melanoma (HCC) 2010   right elbow; treated at [facility]   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 ([facility])   Mild anxiety    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   Peripheral edema 11/26/2016   Peripheral neuropathy 01/01/2013   Primary osteoarthritis of both hands 07/09/2017   Primary osteoarthritis of both knees 07/09/2017   Chondromalacia patella   Rheumatoid arthritis (HCC) 11/02/2018   S/P laparoscopic fundoplication 08/09/2011   Steatosis of liver 12/07/2009   Qualifier: Diagnosis of  By: Surface RN, Donna     Viral URI with cough 09/11/2014   Vitamin B12 deficiency    VITAMIN B12 DEFICIENCY 02/27/2007   Chronic    Vitamin D  deficiency    Past Surgical History:  Procedure Laterality Date   BREAST BIOPSY Left 04/2014   BREAST RECONSTRUCTION WITH PLACEMENT OF TISSUE EXPANDER AND FLEX HD (ACELLULAR HYDRATED DERMIS) Left 07/28/2014   Procedure: LEFT BREAST RECONSTRUCTION PLACEMENT OF LEFT TISSUE EXPANDER ;  Surgeon: Alm Sick, MD;  Location: [facility] OR;  Service: Plastics;  Laterality: Left;   BREAST RECONSTRUCTION WITH PLACEMENT OF TISSUE EXPANDER AND FLEX HD (ACELLULAR HYDRATED DERMIS) Left 09/08/2014   Procedure: REMOVAL OF TISSUE EXPANDER FROM LEFT BREAST;  Surgeon: Alm Sick, MD;  Location: [facility] OR;  Service: Plastics;  Laterality: Left;   BUNIONECTOMY Bilateral 1970's   COLONOSCOPY     Dr Jakie   CYSTOSCOPY WITH RETROGRADE PYELOGRAM, URETEROSCOPY AND STENT PLACEMENT Right 06/09/2015   Procedure: CYSTOSCOPY WITH RIGHT RETROGRADE PYELOGRAM, RIGHT URETEROSCOPY AND RIGHT URTERAL STENT PLACEMENT;  Surgeon: Morene LELON Salines, MD;  Location: [facility] ORS;  Service: Urology;  Laterality: Right;   ESOPHAGOGASTRODUODENOSCOPY     HERNIA REPAIR     HOLMIUM LASER APPLICATION Right 06/09/2015   Procedure: HOLMIUM LASER APPLICATION;  Surgeon: Morene LELON Salines, MD;  Location: [facility] ORS;  Service: Urology;  Laterality: Right;   LATISSIMUS FLAP TO BREAST Left 09/08/2014   Procedure: LEFT LATISSIMUS FLAP TO BREAST WITH SALINE IMPLANT FOR BREAST RECONSTRUCTION;  Surgeon: Alm Sick, MD;  Location: [facility] OR;  Service: Plastics;  Laterality: Left;   LUMBAR FUSION  01/2018   L5-S1 transitional segmental laminectomy and Fusion   MASTECTOMY Right 1996    chemotherapy. pt. states 13 lymph nodes were removed   MASTECTOMY COMPLETE / SIMPLE W/ SENTINEL NODE BIOPSY Left 07/28/2014   MASTECTOMY W/ SENTINEL NODE BIOPSY Left 07/28/2014   Procedure: LEFT MASTECTOMY WITH SENTINEL LYMPH NODE MAPPING;  Surgeon: Deward Null III, MD;  Location: [facility] OR;  Service: General;  Laterality: Left;   MELANOMA EXCISION Right 2010   From elbow-- Done at [facility]    NISSEN FUNDOPLICATION  09/2008   RECONSTRUCTION BREAST IMMEDIATE / DELAYED W/ TISSUE EXPANDER Left 07/28/2014   TEMPOROMANDIBULAR JOINT SURGERY Bilateral 1987   TONSILLECTOMY     Social History   Social History Narrative   She lives with husband.     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   Review of Systems As per HPI  Objective:   Physical Exam @BP  124/72   Pulse 79   Ht 5' 5 (1.651 m)   Wt 181 lb (82.1 kg)   LMP  (LMP Unknown)   BMI 30.12 kg/m @  General:  NAD Eyes:   anicteric Lungs:  clear Heart::  S1S2 no rubs, murmurs or gallops Abdomen:  Soft, mildly tender epigastrium, BS+     Data Reviewed:  See HPI

## 2024-01-13 DIAGNOSIS — D692 Other nonthrombocytopenic purpura: Secondary | ICD-10-CM | POA: Diagnosis not present

## 2024-01-13 DIAGNOSIS — L570 Actinic keratosis: Secondary | ICD-10-CM | POA: Diagnosis not present

## 2024-01-13 DIAGNOSIS — D1801 Hemangioma of skin and subcutaneous tissue: Secondary | ICD-10-CM | POA: Diagnosis not present

## 2024-01-13 DIAGNOSIS — S0081XA Abrasion of other part of head, initial encounter: Secondary | ICD-10-CM | POA: Diagnosis not present

## 2024-01-13 DIAGNOSIS — L814 Other melanin hyperpigmentation: Secondary | ICD-10-CM | POA: Diagnosis not present

## 2024-01-13 DIAGNOSIS — L821 Other seborrheic keratosis: Secondary | ICD-10-CM | POA: Diagnosis not present

## 2024-01-13 DIAGNOSIS — L820 Inflamed seborrheic keratosis: Secondary | ICD-10-CM | POA: Diagnosis not present

## 2024-01-13 DIAGNOSIS — Z85820 Personal history of malignant melanoma of skin: Secondary | ICD-10-CM | POA: Diagnosis not present

## 2024-01-14 ENCOUNTER — Encounter: Payer: Self-pay | Admitting: Internal Medicine

## 2024-01-22 ENCOUNTER — Encounter: Payer: Self-pay | Admitting: Internal Medicine

## 2024-01-22 ENCOUNTER — Ambulatory Visit: Admitting: Internal Medicine

## 2024-01-22 VITALS — BP 195/93 | HR 75 | Temp 97.1°F | Resp 16 | Ht 65.0 in | Wt 181.0 lb

## 2024-01-22 DIAGNOSIS — Q399 Congenital malformation of esophagus, unspecified: Secondary | ICD-10-CM

## 2024-01-22 DIAGNOSIS — K2100 Gastro-esophageal reflux disease with esophagitis, without bleeding: Secondary | ICD-10-CM | POA: Diagnosis not present

## 2024-01-22 DIAGNOSIS — R1310 Dysphagia, unspecified: Secondary | ICD-10-CM

## 2024-01-22 DIAGNOSIS — K219 Gastro-esophageal reflux disease without esophagitis: Secondary | ICD-10-CM

## 2024-01-22 DIAGNOSIS — Z9889 Other specified postprocedural states: Secondary | ICD-10-CM

## 2024-01-22 DIAGNOSIS — K449 Diaphragmatic hernia without obstruction or gangrene: Secondary | ICD-10-CM | POA: Diagnosis not present

## 2024-01-22 DIAGNOSIS — T85521A Displacement of esophageal anti-reflux device, initial encounter: Secondary | ICD-10-CM

## 2024-01-22 DIAGNOSIS — R1319 Other dysphagia: Secondary | ICD-10-CM

## 2024-01-22 MED ORDER — OMEPRAZOLE 40 MG PO CPDR
40.0000 mg | DELAYED_RELEASE_CAPSULE | Freq: Two times a day (BID) | ORAL | 3 refills | Status: AC
Start: 2024-01-22 — End: ?

## 2024-01-22 MED ORDER — SODIUM CHLORIDE 0.9 % IV SOLN: 500.0000 mL | INTRAVENOUS | Status: DC

## 2024-01-22 MED ORDER — METOCLOPRAMIDE HCL 5 MG PO TABS: 5.0000 mg | ORAL_TABLET | Freq: Three times a day (TID) | ORAL | 0 refills | Status: DC

## 2024-01-22 MED ORDER — METOCLOPRAMIDE HCL 5 MG PO TABS
5.0000 mg | ORAL_TABLET | Freq: Three times a day (TID) | ORAL | 2 refills | Status: DC
Start: 2024-01-22 — End: 2024-01-22

## 2024-01-22 MED ORDER — OMEPRAZOLE 40 MG PO CPDR: 40.0000 mg | DELAYED_RELEASE_CAPSULE | Freq: Two times a day (BID) | ORAL | 3 refills | Status: AC

## 2024-01-22 NOTE — Progress Notes (Unsigned)
 Pt's states no medical or surgical changes since previsit or office visit.

## 2024-01-22 NOTE — Progress Notes (Unsigned)
 Sedate, gd SR, tolerated procedure well, VSS, report to RN

## 2024-01-22 NOTE — Progress Notes (Unsigned)
 History and Physical Interval Note:  01/22/2024 4:07 PM  [name]  has presented today for endoscopic procedure(s), with the diagnosis of  Encounter Diagnoses  Name Primary?   Esophageal dysphagia Yes   Gastroesophageal reflux disease, unspecified whether esophagitis present   .  The various methods of evaluation and treatment have been discussed with the patient and/or family. After consideration of risks, benefits and other options for treatment, the patient has consented to  the endoscopic procedure(s).   The patient's history has been reviewed, patient examined, no change in status, stable for endoscopic procedure(s).  I have reviewed the patient's chart and labs.  Questions were answered to the patient's satisfaction.     Lupita CHARLENA Commander, MD, [facility]

## 2024-01-22 NOTE — Op Note (Signed)
 [facility] Patient Name: [name] Procedure Date: 01/22/2024 3:58 PM MRN: [id number] Endoscopist: Lupita FORBES Commander , MD, [id number] Age: 78 Referring MD:  Date of Birth: 05/04/1946 Gender: Female Account #: [id number] Procedure:                Upper GI endoscopy Indications:              Dysphagia, Gastro-esophageal reflux disease Medicines:                Monitored Anesthesia Care Procedure:                Pre-Anesthesia Assessment:                           - Prior to the procedure, a History and Physical                            was performed, and patient medications and                            allergies were reviewed. The patient's tolerance of                            previous anesthesia was also reviewed. The risks                            and benefits of the procedure and the sedation                            options and risks were discussed with the patient.                            All questions were answered, and informed consent                            was obtained. Prior Anticoagulants: The patient has                            taken no anticoagulant or antiplatelet agents. ASA                            Grade Assessment: III - A patient with severe                            systemic disease. After reviewing the risks and                            benefits, the patient was deemed in satisfactory                            condition to undergo the procedure.                           After obtaining informed consent, the endoscope was  passed under direct vision. Throughout the                            procedure, the patient's blood pressure, pulse, and                            oxygen saturations were monitored continuously. The                            Olympus Scope [id number] was introduced through the                            mouth, and advanced to the second part of duodenum.                            The  upper GI endoscopy was accomplished without                            difficulty. The patient tolerated the procedure                            fairly well. Scope In: Scope Out: Findings:                 The examined esophagus was moderately tortuous.                           LA Grade A (one or more mucosal breaks less than 5                            mm, not extending between tops of 2 mucosal folds)                            esophagitis with no bleeding was found in the lower                            third of the esophagus.                           A 5 cm hiatal hernia was present.                           Evidence of a Nissen fundoplication was found in                            the cardia. The wrap appeared loose.                           The exam was otherwise without abnormality.                           The cardia and gastric fundus were otherwise normal  on retroflexion. Complications:            No immediate complications. She did have transient                            O2 desaturation managed by anesthetist. Estimated Blood Loss:     Estimated blood loss: none. Impression:               - Tortuous esophagus. Known dysmotility. Know                            stricture seen today so no dilation.                           - LA Grade A reflux esophagitis with no bleeding.                           - 5 cm hiatal hernia. Not seen on 2024, 2023 exams                            though has been noted on ba swallow                           - A Nissen fundoplication was found. The wrap                            appears loose.                           - The examination was otherwise normal.                           - No specimens collected. Recommendation:           - Patient has a contact number available for                            emergencies. The signs and symptoms of potential                            delayed complications were  discussed with the                            patient. Return to normal activities tomorrow.                            Written discharge instructions were provided to the                            patient.                           - Cut food into small pieces (thumnail size)                           - GERD prevention diet. Continue wedge                           -  Change omeprazole  to 40 mg bid                           - add reglan  5 mg tid ac                           - RTC to see me about 2 months Lupita FORBES Commander, MD 01/22/2024 4:44:58 PM This report has been signed electronically.

## 2024-01-22 NOTE — Patient Instructions (Addendum)
 Cut food into small pieces. GERD prevention diet. Continue wedge. Change omeprazole  to 40 mg twice a day. Add Reglan  5 mg three times a day at bedtime. RTC to see me about 2 months. Handouts provided on GERD and hiatal hernia.  YOU HAD AN ENDOSCOPIC PROCEDURE TODAY AT THE [facility]:   Refer to the procedure report that was given to you for any specific questions about what was found during the examination.  If the procedure report does not answer your questions, please call your gastroenterologist to clarify.  If you requested that your care partner not be given the details of your procedure findings, then the procedure report has been included in a sealed envelope for you to review at your convenience later.  YOU SHOULD EXPECT: Some feelings of bloating in the abdomen. Passage of more gas than usual.  Walking can help get rid of the air that was put into your GI tract during the procedure and reduce the bloating. If you had a lower endoscopy (such as a colonoscopy or flexible sigmoidoscopy) you may notice spotting of blood in your stool or on the toilet paper. If you underwent a bowel prep for your procedure, you may not have a normal bowel movement for a few days.  Please Note:  You might notice some irritation and congestion in your nose or some drainage.  This is from the oxygen used during your procedure.  There is no need for concern and it should clear up in a day or so.  SYMPTOMS TO REPORT IMMEDIATELY:  Following upper endoscopy (EGD)  Vomiting of blood or coffee ground material  New chest pain or pain under the shoulder blades  Painful or persistently difficult swallowing  New shortness of breath  Fever of 100F or higher  Black, tarry-looking stools  For urgent or emergent issues, a gastroenterologist can be reached at any hour by calling [phone]. Do not use MyChart messaging for urgent concerns.    DIET:  We do recommend a small meal at first, but then you  may proceed to your regular diet.  Drink plenty of fluids but you should avoid alcoholic beverages for 24 hours.  ACTIVITY:  You should plan to take it easy for the rest of today and you should NOT DRIVE or use heavy machinery until tomorrow (because of the sedation medicines used during the test).    FOLLOW UP: Our staff will call the number listed on your records the next business day following your procedure.  We will call around 7:15- 8:00 am to check on you and address any questions or concerns that you may have regarding the information given to you following your procedure. If we do not reach you, we will leave a message.     If any biopsies were taken you will be contacted by phone or by letter within the next 1-3 weeks.  Please call us  at [phone] if you have not heard about the biopsies in 3 weeks.    SIGNATURES/CONFIDENTIALITY: You and/or your care partner have signed paperwork which will be entered into your electronic medical record.  These signatures attest to the fact that that the information above on your After Visit Summary has been reviewed and is understood.  Full responsibility of the confidentiality of this discharge information lies with you and/or your care-partner.It looks like the fundoplication is looser and there is a hiatal hernia seen.  Also have reflux esophagitis.  I did not see any stricture or narrow  areas.  I am changing the omeprazole  to 40 mg twice a day help heal the esophagus and hopefully help your symptoms. Also adding metaclopramide.  You had some spasm of the airway and the anesthetist had to hold your jaw so it may be sore.  We also know your esophagus does not squeeze and relax properly (esophageal dysmotility). Need to cut food small and chew well - would have meat and other harder foods cut to size of thumbnail.  If the medication change does not help after 2 months (need to give it that long) let me know.  I appreciate the opportunity  to care for you. Lupita CHARLENA Commander, MD, [facility]

## 2024-01-23 ENCOUNTER — Telehealth: Payer: Self-pay

## 2024-01-23 NOTE — Telephone Encounter (Signed)
  Follow up Call-     01/22/2024    3:03 PM 10/17/2022    9:09 AM 11/20/2021    3:06 PM 11/20/2021    3:03 PM  Call back number  Post procedure Call Back phone  # [phone]   Permission to leave phone message Yes Yes  Yes     Patient questions:  Do you have a fever, pain , or abdominal swelling? No. Pain Score  0 *  Have you tolerated food without any problems? Yes.    Have you been able to return to your normal activities? Yes.    Do you have any questions about your discharge instructions: Diet   No. Medications  No. Follow up visit  No.  Do you have questions or concerns about your Care? No.  Actions: * If pain score is 4 or above: No action needed, pain <4.

## 2024-02-26 ENCOUNTER — Encounter: Payer: Self-pay | Admitting: Pharmacist

## 2024-03-24 ENCOUNTER — Other Ambulatory Visit: Payer: Self-pay | Admitting: Internal Medicine

## 2024-03-24 DIAGNOSIS — K219 Gastro-esophageal reflux disease without esophagitis: Secondary | ICD-10-CM

## 2024-03-24 DIAGNOSIS — R1319 Other dysphagia: Secondary | ICD-10-CM

## 2024-03-24 NOTE — Telephone Encounter (Signed)
 Please advise Sir, thank you. She is coming back to see Delon Failing PA mid December.

## 2024-03-26 ENCOUNTER — Ambulatory Visit: Admitting: Internal Medicine

## 2024-04-05 ENCOUNTER — Other Ambulatory Visit (HOSPITAL_BASED_OUTPATIENT_CLINIC_OR_DEPARTMENT_OTHER): Payer: Self-pay

## 2024-04-05 ENCOUNTER — Other Ambulatory Visit: Payer: Self-pay

## 2024-04-09 ENCOUNTER — Encounter: Payer: Self-pay | Admitting: Family Medicine

## 2024-04-09 ENCOUNTER — Ambulatory Visit (INDEPENDENT_AMBULATORY_CARE_PROVIDER_SITE_OTHER): Admitting: Family Medicine

## 2024-04-09 VITALS — BP 133/70 | HR 95 | Ht 65.0 in | Wt 181.0 lb

## 2024-04-09 DIAGNOSIS — Z0000 Encounter for general adult medical examination without abnormal findings: Secondary | ICD-10-CM

## 2024-04-09 DIAGNOSIS — I10 Essential (primary) hypertension: Secondary | ICD-10-CM | POA: Diagnosis not present

## 2024-04-09 DIAGNOSIS — E782 Mixed hyperlipidemia: Secondary | ICD-10-CM

## 2024-04-09 DIAGNOSIS — F419 Anxiety disorder, unspecified: Secondary | ICD-10-CM | POA: Diagnosis not present

## 2024-04-09 DIAGNOSIS — R5383 Other fatigue: Secondary | ICD-10-CM

## 2024-04-09 DIAGNOSIS — E559 Vitamin D deficiency, unspecified: Secondary | ICD-10-CM

## 2024-04-09 LAB — COMPREHENSIVE METABOLIC PANEL WITH GFR
ALT: 15 U/L (ref 0–35)
AST: 16 U/L (ref 0–37)
Albumin: 4.1 g/dL (ref 3.5–5.2)
Alkaline Phosphatase: 64 U/L (ref 39–117)
BUN: 16 mg/dL (ref 6–23)
CO2: 29 meq/L (ref 19–32)
Calcium: 9 mg/dL (ref 8.4–10.5)
Chloride: 106 meq/L (ref 96–112)
Creatinine, Ser: 0.93 mg/dL (ref 0.40–1.20)
GFR: 59 mL/min — ABNORMAL LOW (ref >60.00)
Glucose, Bld: 86 mg/dL (ref 70–99)
Potassium: 3.8 meq/L (ref 3.5–5.1)
Sodium: 142 meq/L (ref 135–145)
Total Bilirubin: 0.5 mg/dL (ref 0.2–1.2)
Total Protein: 6.3 g/dL (ref 6.0–8.3)

## 2024-04-09 LAB — B12 AND FOLATE PANEL
Folate: 13.7 ng/mL (ref >5.9)
Vitamin B-12: 1181 pg/mL — ABNORMAL HIGH (ref 211–911)

## 2024-04-09 LAB — VITAMIN D 25 HYDROXY (VIT D DEFICIENCY, FRACTURES): VITD: 26.46 ng/mL — ABNORMAL LOW (ref 30.00–100.00)

## 2024-04-09 LAB — LIPID PANEL
Cholesterol: 140 mg/dL (ref 0–200)
HDL: 42.9 mg/dL (ref >39.00)
LDL Cholesterol: 76 mg/dL (ref 0–99)
NonHDL: 97.54
Total CHOL/HDL Ratio: 3
Triglycerides: 110 mg/dL (ref 0.0–149.0)
VLDL: 22 mg/dL (ref 0.0–40.0)

## 2024-04-09 LAB — CBC WITH DIFFERENTIAL/PLATELET
Basophils Absolute: 0.1 K/uL (ref 0.0–0.1)
Basophils Relative: 0.9 % (ref 0.0–3.0)
Eosinophils Absolute: 0.2 K/uL (ref 0.0–0.7)
Eosinophils Relative: 3.3 % (ref 0.0–5.0)
HCT: 41.6 % (ref 36.0–46.0)
Hemoglobin: 14.1 g/dL (ref 12.0–15.0)
Lymphocytes Relative: 22.7 % (ref 12.0–46.0)
Lymphs Abs: 1.4 K/uL (ref 0.7–4.0)
MCHC: 34 g/dL (ref 30.0–36.0)
MCV: 91.9 fl (ref 78.0–100.0)
Monocytes Absolute: 0.4 K/uL (ref 0.1–1.0)
Monocytes Relative: 7.3 % (ref 3.0–12.0)
Neutro Abs: 4 K/uL (ref 1.4–7.7)
Neutrophils Relative %: 65.8 % (ref 43.0–77.0)
Platelets: 199 K/uL (ref 150.0–400.0)
RBC: 4.52 Mil/uL (ref 3.87–5.11)
RDW: 13.1 % (ref 11.5–15.5)
WBC: 6 K/uL (ref 4.0–10.5)

## 2024-04-09 LAB — IBC + FERRITIN
Ferritin: 28 ng/mL (ref 10.0–291.0)
Iron: 93 ug/dL (ref 42–145)
Saturation Ratios: 26.7 % (ref 20.0–50.0)
TIBC: 348.6 ug/dL (ref 250.0–450.0)
Transferrin: 249 mg/dL (ref 212.0–360.0)

## 2024-04-09 LAB — TSH: TSH: 0.97 u[IU]/mL (ref 0.35–5.50)

## 2024-04-09 NOTE — Progress Notes (Signed)
 New Patient Office Visit   Subjective     Patient ID: [name], female   DOB: 12/18/1945  Age: 78 y.o. MRN: [id number]   CC:  Chief Complaint  Patient presents with   Establish Care      HPI [name] presents to establish care. She has been doing good overall, but feeling more fatigued/down, low energy the past few weeks. No obvious triggers other than possibly the weather/season changes.    Hypertension: - Medications: Amlodipine  5 mg daily and Lasix  20 mg daily. - Compliance: good - Checking BP at home: usually good  - Denies any SOB, recurrent headaches, CP, vision changes, LE edema, dizziness, palpitations, or medication side effects. - Diet: heart healthy - Exercise: occasional    Hyperlipidemia: - medications: Repatha  140 mg every two weeks. - compliance: good - medication SEs: none The ASCVD Risk score (Arnett DK, et al., 2019) failed to calculate for the following reasons:   Risk score cannot be calculated because patient has a medical history suggesting prior/existing ASCVD   Anxiety: - Treatment: citalopram  20 mg daily - Medication side effects: no - SI/HI: no - Update: Stable.    B12 deficiency: - OTC daily supplement        Outpatient Medications Prior to Visit  Medication Sig   amLODipine  (NORVASC ) 5 MG tablet Take 1 tablet (5 mg total) by mouth daily.   aspirin  EC 81 MG tablet Take 81 mg by mouth daily. Swallow whole.   cholecalciferol (VITAMIN D3) 25 MCG (1000 UNIT) tablet Take 1,000 Units by mouth daily. Patient takes 2000 units   citalopram  (CELEXA ) 20 MG tablet Take 1 tablet (20 mg total) by mouth every morning.   Cyanocobalamin  (VITAMIN B-12) 5000 MCG TBDP Take by mouth.   Evolocumab  (REPATHA  SURECLICK) 140 MG/ML SOAJ Inject 140 mg into the skin every 14 (fourteen) days. Please apply healthwell grant   furosemide  (LASIX ) 40 MG tablet Take 1 tablet (40 mg total) by mouth every morning. (Patient taking differently: Take 40 mg by  mouth every other day.)   metoCLOPramide  (REGLAN ) 5 MG tablet TAKE 1 TABLET BY MOUTH THREE TIMES DAILY BEFORE MEAL(S)   omeprazole  (PRILOSEC) 40 MG capsule Take 1 capsule (40 mg total) by mouth 2 (two) times daily before a meal. 30 minutes before breakfast and supper   omeprazole  (PRILOSEC) 40 MG capsule Take 1 capsule (40 mg total) by mouth 2 (two) times daily.   No facility-administered medications prior to visit.   Past Medical History:  Diagnosis Date   Allergy    Ankle effusion, left 03/23/2020   Arthritis    spine (07/28/2014)   Blood dyscrasia    bruises and bleed easily   Breast cancer, left breast (HCC) 07/28/2014   Breast cancer, right breast (HCC) 1995   CHEST XRAY, ABNORMAL 01/04/2008   Qualifier: Diagnosis of  By: Plotnikov MD, Karlynn GAILS    Chronic back pain    all over   Chronic cough 12/07/2007   Could be related to GERD - relapsed    COAGULOPATHY 07/12/2008   Qualifier: Diagnosis of  By: Kowalk CMA (AAMA), Leisha     Complication of anesthesia    went to sleep easily but hard to wake up up until elbow OR in 2010   CVA (cerebral vascular accident) (HCC)    2022   DDD (degenerative disc disease), cervical 07/09/2017   DDD (degenerative disc disease), lumbar 07/09/2017   With spinal stenosis.   Dr. Malcolm  Depression    Dizziness 10/27/2020   Dyspnea    Normal Spirometry 03/2008 EF 65% BNP normal 11/2007   Episode of recurrent major depressive disorder 12/05/2017   Essential hypertension 02/27/2007   Mild Off meds 2013  BP Readings from Last 3 Encounters:  02/10/12 128/90  08/09/11 134/62  03/06/11 150/86      Family history of breast cancer    Family history of colon cancer    Family history of pancreatic cancer    Fibromyalgia 02/27/2007   Chronic    Gastric polyps    GERD 02/27/2007   S/p Nissen's 2010    GERD (gastroesophageal reflux disease)    History of chicken pox    History of hiatal hernia    Hx of adenomatous polyp of colon 07/03/2015    Kidney stone    right kidney    Lichen sclerosus    Malignant neoplasm of upper-outer quadrant of left breast in female, 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   Medicare annual wellness visit, subsequent 06/30/2016   Melanoma (HCC) 2010   right elbow; [facility] at [facility]   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 ([facility])   Mild anxiety    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   Peripheral edema 11/26/2016   Peripheral neuropathy 01/01/2013   Primary osteoarthritis of both hands 07/09/2017   Primary osteoarthritis of both knees 07/09/2017   Chondromalacia patella   Rheumatoid arthritis (HCC) 11/02/2018   S/P laparoscopic fundoplication 08/09/2011   Steatosis of liver 12/07/2009   Qualifier: Diagnosis of  By: Surface RN, Donna     Viral URI with cough 09/11/2014   Vitamin B12 deficiency    VITAMIN B12 DEFICIENCY 02/27/2007   Chronic    Vitamin D  deficiency     Past Surgical History:  Procedure Laterality Date   BREAST BIOPSY Left 04/2014   BREAST RECONSTRUCTION WITH PLACEMENT OF TISSUE EXPANDER AND FLEX HD (ACELLULAR HYDRATED DERMIS) Left 07/28/2014   Procedure: LEFT BREAST RECONSTRUCTION PLACEMENT OF LEFT TISSUE EXPANDER ;  Surgeon: Alm Sick, MD;  Location: [facility] OR;  Service: Plastics;  Laterality: Left;   BREAST RECONSTRUCTION WITH PLACEMENT OF TISSUE EXPANDER AND FLEX HD (ACELLULAR HYDRATED DERMIS) Left 09/08/2014   Procedure: REMOVAL OF TISSUE EXPANDER FROM LEFT BREAST;  Surgeon: Alm Sick, MD;  Location: [facility] OR;  Service: Plastics;  Laterality: Left;   BUNIONECTOMY Bilateral 1970's   COLONOSCOPY     Dr Jakie   CYSTOSCOPY WITH RETROGRADE PYELOGRAM, URETEROSCOPY AND STENT PLACEMENT Right 06/09/2015   Procedure: CYSTOSCOPY WITH RIGHT RETROGRADE PYELOGRAM, RIGHT URETEROSCOPY AND RIGHT URTERAL STENT PLACEMENT;  Surgeon: Morene LELON Salines, MD;  Location: [facility] ORS;  Service: Urology;  Laterality: Right;   ESOPHAGOGASTRODUODENOSCOPY     HERNIA REPAIR      HOLMIUM LASER APPLICATION Right 06/09/2015   Procedure: HOLMIUM LASER APPLICATION;  Surgeon: Morene LELON Salines, MD;  Location: [facility] ORS;  Service: Urology;  Laterality: Right;   LATISSIMUS FLAP TO BREAST Left 09/08/2014   Procedure: LEFT LATISSIMUS FLAP TO BREAST WITH SALINE IMPLANT FOR BREAST RECONSTRUCTION;  Surgeon: Alm Sick, MD;  Location: [facility] OR;  Service: Plastics;  Laterality: Left;   LUMBAR FUSION  01/2018   L5-S1 transitional segmental laminectomy and Fusion   MASTECTOMY Right 1996    chemotherapy. pt. states 13 lymph nodes were removed   MASTECTOMY COMPLETE / SIMPLE W/ SENTINEL NODE BIOPSY Left 07/28/2014   MASTECTOMY W/ SENTINEL NODE BIOPSY Left 07/28/2014   Procedure: LEFT MASTECTOMY WITH  SENTINEL LYMPH NODE MAPPING;  Surgeon: Deward Null III, MD;  Location: [facility] OR;  Service: General;  Laterality: Left;   MELANOMA EXCISION Right 2010   From elbow-- Done at [facility]    NISSEN FUNDOPLICATION  09/2008   RECONSTRUCTION BREAST IMMEDIATE / DELAYED W/ TISSUE EXPANDER Left 07/28/2014   TEMPOROMANDIBULAR JOINT SURGERY Bilateral 1987   TONSILLECTOMY       Family History  Problem Relation Age of Onset   Stroke Mother    Colon polyps Father        between 10-20   Dementia Father    CAD Father        CABG at age 34   Pancreatic cancer Brother 74   Colon polyps Brother        between 10-20   Cancer Paternal Uncle        blood cancer   Anemia Paternal Grandfather        pernicious anemia   Colon cancer Cousin 38       double first cousin   Cancer Cousin        breast   Colon cancer Cousin 78       double first cousin   Cancer Cousin    Breast cancer Cousin 43       double first cousin   Stroke Other        F 1st degree relative 89, M 1st degree relative   Esophageal cancer Neg Hx    Stomach cancer Neg Hx     Social History   Socioeconomic History   Marital status: Married    Spouse name: Not on file   Number of children: 2   Years of education: Not on file   Highest  education level: Not on file  Occupational History   Occupation: Production Designer, Theatre/television/film  Tobacco Use   Smoking status: Never   Smokeless tobacco: Never   Tobacco comments:    Regular Exercise - Yes  Vaping Use   Vaping status: Never Used  Substance and Sexual Activity   Alcohol use: No   Drug use: No   Sexual activity: Yes  Other Topics Concern   Not on file  Social History Narrative   She lives with husband.     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         Right Handed    Lives in a one story home with a basement    Social Drivers of Health   Financial Resource Strain: Low Risk  (10/06/2023)   Overall Financial Resource Strain (CARDIA)    Difficulty of Paying Living Expenses: Not very hard  Food Insecurity: No Food Insecurity (10/06/2023)   Hunger Vital Sign    Worried About Running Out of Food in the Last Year: Never true    Ran Out of Food in the Last Year: Never true  Transportation Needs: No Transportation Needs (10/06/2023)   PRAPARE - Administrator, Civil Service (Medical): No    Lack of Transportation (Non-Medical): No  Physical Activity: Unknown (10/06/2023)   Exercise Vital Sign    Days of Exercise per Week: 0 days    Minutes of Exercise per Session: Not on file  Stress: No Stress Concern Present (10/06/2023)   [employer] of Occupational Health - Occupational Stress Questionnaire    Feeling of Stress : Not at all  Social Connections: Socially Integrated (10/06/2023)   Social Connection and Isolation Panel  Frequency of Communication with Friends and Family: More than three times a week    Frequency of Social Gatherings with Friends and Family: Twice a week    Attends Religious Services: More than 4 times per year    Active Member of [employer] or Organizations: Yes    Attends Engineer, Structural: More than 4 times per year    Marital Status: Married       ROS All review of systems negative  except what is listed in the HPI    Objective     BP 133/70   Pulse 95   Ht 5' 5 (1.651 m)   Wt 181 lb (82.1 kg)   LMP  (LMP Unknown)   SpO2 96%   BMI 30.12 kg/m   Physical Exam Vitals reviewed.  Constitutional:      Appearance: Normal appearance.  Cardiovascular:     Rate and Rhythm: Normal rate and regular rhythm.  Pulmonary:     Effort: Pulmonary effort is normal.     Breath sounds: Normal breath sounds.  Skin:    General: Skin is warm and dry.  Neurological:     Mental Status: She is alert and oriented to person, place, and time.  Psychiatric:        Mood and Affect: Mood normal.        Behavior: Behavior normal.        Thought Content: Thought content normal.        Judgment: Judgment normal.          Assessment & Plan:     Problem List Items Addressed This Visit       Active Problems   Vitamin D  deficiency   OTC supplement. Check labs.      Relevant Orders   VITAMIN D  25 Hydroxy (Vit-D Deficiency, Fractures)   Essential hypertension   Blood pressure is at goal for age and co-morbidities.   Recommendations: continue current regimen - BP goal <130/80 - monitor and log blood pressures at home - check around the same time each day in a relaxed setting - Limit salt to <2000 mg/day - Follow DASH eating plan (heart healthy diet) - limit alcohol to 2 standard drinks per day for men and 1 per day for women - avoid tobacco products - get at least 2 hours of regular aerobic exercise weekly Patient aware of signs/symptoms requiring further/urgent evaluation. Labs updated today.       Mild anxiety   On citalopram  20 mg with recent onset of low mood and lack of motivation. Possible seasonal affective disorder considered. PHQ9/GAD7 reviewed. No SI/HI.  - Checked vitamin D  and B12 levels. - Not interested in adjust medication at this time.      HLD (hyperlipidemia)   On Repatha  with no side effects. Cholesterol levels previously elevated but expected  improvement with continued therapy. - Checked cholesterol levels. - Continue Repatha  therapy.      Relevant Orders   Lipid panel   Other Visit Diagnoses       Fatigue, unspecified type    -  Primary Update labs today.    Relevant Orders   B12 and Folate Panel   TSH   CBC with Differential/Platelet   Comprehensive metabolic panel with GFR   IBC + Ferritin     Encounter for medical examination to establish care                 Return for chronic disease management 3-4 months.  Waddell  KATHEE Mon, NP  I,Emily Lagle,acting as a neurosurgeon for Waddell KATHEE Mon, NP.,have documented all relevant documentation on the behalf of Waddell KATHEE Mon, NP.  I, Waddell KATHEE Mon, NP, have reviewed all documentation for this visit. The documentation on 04/09/2024 for the exam, diagnosis, procedures, and orders are all accurate and complete.

## 2024-04-09 NOTE — Assessment & Plan Note (Signed)
Blood pressure is at goal for age and co-morbidities.   Recommendations: continue current regimen - BP goal <130/80 - monitor and log blood pressures at home - check around the same time each day in a relaxed setting - Limit salt to <2000 mg/day - Follow DASH eating plan (heart healthy diet) - limit alcohol to 2 standard drinks per day for men and 1 per day for women - avoid tobacco products - get at least 2 hours of regular aerobic exercise weekly Patient aware of signs/symptoms requiring further/urgent evaluation. Labs updated today.  

## 2024-04-09 NOTE — Assessment & Plan Note (Signed)
 On citalopram  20 mg with recent onset of low mood and lack of motivation. Possible seasonal affective disorder considered. PHQ9/GAD7 reviewed. No SI/HI.  - Checked vitamin D  and B12 levels. - Not interested in adjust medication at this time.

## 2024-04-09 NOTE — Assessment & Plan Note (Signed)
 OTC supplement. Check labs.

## 2024-04-09 NOTE — Assessment & Plan Note (Signed)
 On Repatha  with no side effects. Cholesterol levels previously elevated but expected improvement with continued therapy. - Checked cholesterol levels. - Continue Repatha  therapy.

## 2024-04-12 ENCOUNTER — Ambulatory Visit: Payer: Self-pay | Admitting: Family Medicine

## 2024-04-21 NOTE — Progress Notes (Addendum)
 [name]                                          MRN: [id number]   04/21/2024   The [facility] Quality Team Specialist reviewed this patient medical record for the purposes of chart review for care gap closure. The following were reviewed: abstraction for care gap closure-controlling blood pressure.  06/08/2024- NO NEW CBP TO CLOSE    [facility] Quality Team

## 2024-05-05 ENCOUNTER — Other Ambulatory Visit: Payer: Self-pay

## 2024-05-05 DIAGNOSIS — F419 Anxiety disorder, unspecified: Secondary | ICD-10-CM

## 2024-05-05 MED ORDER — CITALOPRAM HYDROBROMIDE 20 MG PO TABS: 20.0000 mg | ORAL_TABLET | Freq: Every morning | ORAL | 1 refills | Status: AC

## 2024-05-11 ENCOUNTER — Ambulatory Visit: Admitting: Physician Assistant

## 2024-05-11 ENCOUNTER — Other Ambulatory Visit (HOSPITAL_BASED_OUTPATIENT_CLINIC_OR_DEPARTMENT_OTHER): Payer: Self-pay

## 2024-05-27 ENCOUNTER — Other Ambulatory Visit: Payer: Self-pay | Admitting: Internal Medicine

## 2024-05-27 DIAGNOSIS — R1319 Other dysphagia: Secondary | ICD-10-CM

## 2024-05-27 DIAGNOSIS — K219 Gastro-esophageal reflux disease without esophagitis: Secondary | ICD-10-CM

## 2024-06-01 ENCOUNTER — Ambulatory Visit: Admitting: Internal Medicine

## 2024-06-28 ENCOUNTER — Other Ambulatory Visit: Payer: Self-pay | Admitting: Internal Medicine

## 2024-06-28 DIAGNOSIS — R1319 Other dysphagia: Secondary | ICD-10-CM

## 2024-06-28 DIAGNOSIS — K219 Gastro-esophageal reflux disease without esophagitis: Secondary | ICD-10-CM

## 2024-07-02 ENCOUNTER — Telehealth: Payer: Self-pay | Admitting: Family

## 2024-07-02 ENCOUNTER — Ambulatory Visit: Admitting: Family

## 2024-07-02 ENCOUNTER — Ambulatory Visit: Payer: Self-pay

## 2024-07-02 VITALS — BP 181/88 | HR 79 | Temp 98.1°F | Resp 16 | Ht 65.5 in | Wt 184.0 lb

## 2024-07-02 DIAGNOSIS — J019 Acute sinusitis, unspecified: Secondary | ICD-10-CM | POA: Insufficient documentation

## 2024-07-02 DIAGNOSIS — I10 Essential (primary) hypertension: Secondary | ICD-10-CM

## 2024-07-02 DIAGNOSIS — R350 Frequency of micturition: Secondary | ICD-10-CM | POA: Insufficient documentation

## 2024-07-02 DIAGNOSIS — R609 Edema, unspecified: Secondary | ICD-10-CM

## 2024-07-02 LAB — POC URINALSYSI DIPSTICK (AUTOMATED)
Blood, UA: NEGATIVE
Glucose, UA: NEGATIVE
Leukocytes, UA: NEGATIVE
Nitrite, UA: POSITIVE
Protein, UA: NEGATIVE
Spec Grav, UA: 1.015
Urobilinogen, UA: 0.2 U/dL
pH, UA: 6

## 2024-07-02 MED ORDER — AMLODIPINE BESYLATE 5 MG PO TABS: 5.0000 mg | ORAL_TABLET | Freq: Every day | ORAL | 0 refills | Status: AC

## 2024-07-02 MED ORDER — FUROSEMIDE 40 MG PO TABS: 40.0000 mg | ORAL_TABLET | ORAL | Status: AC

## 2024-07-02 MED ORDER — AMOXICILLIN-POT CLAVULANATE 875-125 MG PO TABS: 1.0000 | ORAL_TABLET | Freq: Two times a day (BID) | ORAL | 0 refills | Status: AC

## 2024-07-02 NOTE — Telephone Encounter (Signed)
 FYI Only or Action Required?: FYI only for provider: appointment scheduled on 2/6.  Patient was last seen in primary care on 04/09/2024 by Almarie Waddell NOVAK, NP.  Called Nurse Triage reporting Facial Pain and Nasal Congestion.  Symptoms began several weeks ago.  Interventions attempted: Rest, hydration, or home remedies.  Symptoms are: gradually worsening.  Triage Disposition: See HCP Within 4 Hours (Or PCP Triage)  Patient/caregiver understands and will follow disposition?: Yes       Reason for Disposition  [1] Redness or swelling on the cheek, forehead or around the eye AND [2] no fever  Answer Assessment - Initial Assessment Questions This RN recommended pt be examined in next 4 hours, scheduled for this afternoon with PCP office. Advised call back or seek immediate care if new or worsening symptoms.     1. LOCATION: Where does it hurt?      Sinus pain in cheeks  2. ONSET: When did the sinus pain start?  (e.g., hours, days)      Started few days ago but virus started 3 weeks ago, been dragging on  3. SEVERITY: How bad is the pain?   (Scale 0-10; or none, mild, moderate or severe)     6/10  4. RECURRENT SYMPTOM: Have you ever had sinus problems before? If Yes, ask: When was the last time? and What happened that time?      Sinus infections in past, feels similar  5. NASAL CONGESTION: Is the nose blocked? If Yes, ask: Can you open it or must you breathe through your mouth?     Lot of congestion  6. NASAL DISCHARGE: Do you have discharge from your nose? If so ask, What color?     Now yellow, was having some blood streaks in it  7. FEVER: Do you have a fever? If Yes, ask: What is it, how was it measured, and when did it start?      Denies  8. OTHER SYMPTOMS: Do you have any other symptoms? (e.g., sore throat, cough, earache, difficulty breathing)     Swelling and crustiness to eyes in morning, goes down during the day Headache Ear  pressure  Denies: SOB Severe pain Fever Redness to sinus areas  Protocols used: Sinus Pain or Congestion-A-AH

## 2024-07-02 NOTE — Progress Notes (Cosign Needed)
" ° °  Acute Office Visit  Subjective:    Patient ID: [name], female    DOB: 11/09/1945, 79 y.o.   MRN: [id number]  Chief Complaint  Patient presents with   Sinus Problem    Patient complains of sinus pressure and pain   Urine odor    Patient reports urine odor and frequency   Sinus Problem Associated symptoms include congestion and headaches. Pertinent negatives include no coughing.   Patient is in today for sinus congestion and headache for the last 3-4 weeks. Patient reports she was sick 3-4 weeks ago, feels like she recovered from that and has an ongoing lingering headache and sinus congestion. Patient also reports pungent odor in her urine. She has tried over the counter sinus medication and pain away with temporary relief while the medication is on board, but returns.   Review of Systems  Constitutional: Negative.   HENT:  Positive for congestion and sinus pain.   Eyes:        Reports eyes are swollen especially the right  Respiratory: Negative.  Negative for cough.   Cardiovascular: Negative.  Negative for chest pain.  Gastrointestinal: Negative.   Genitourinary:  Positive for frequency.       Reports pungent odor in urine   Musculoskeletal: Negative.   Neurological:  Positive for headaches.  Psychiatric/Behavioral: Negative.       Objective:    BP (!) 181/88 (BP Location: Left Arm, Patient Position: Sitting, Cuff Size: Large)   Pulse 79   Temp 98.1 F (36.7 C) (Oral)   Resp 16   Ht 5' 5.5 (1.664 m)   Wt 184 lb (83.5 kg)   LMP  (LMP Unknown)   SpO2 97%   BMI 30.15 kg/m   Physical Exam Vitals reviewed.  Constitutional:      Appearance: Normal appearance.  HENT:     Head: Normocephalic.     Right Ear: Tympanic membrane normal.     Left Ear: Tympanic membrane normal.     Mouth/Throat:     Mouth: Mucous membranes are moist.  Cardiovascular:     Rate and Rhythm: Normal rate and regular rhythm.     Heart sounds: Normal heart sounds.  Pulmonary:      Effort: Pulmonary effort is normal.     Breath sounds: Normal breath sounds.  Neurological:     Mental Status: She is alert and oriented to person, place, and time.    Results for orders placed or performed in visit on 07/02/24  POCT Urinalysis Dipstick (Automated)  Result Value Ref Range   Color, UA yellow    Clarity, UA cloudy    Glucose, UA Negative Negative   Bilirubin, UA small    Ketones, UA trace    Spec Grav, UA 1.015 1.010 - 1.025   Blood, UA negative    pH, UA 6.0 5.0 - 8.0   Protein, UA Negative Negative   Urobilinogen, UA 0.2 0.2 or 1.0 E.U./dL   Nitrite, UA positive    Leukocytes, UA Negative Negative     Assessment & Plan:  Urinary frequency- -Urine dip +for nitrites meaning bacteria is present will send for urine culture to see what if any type of bacteria grows and adjust antibiotic as needed  Acute sinusitis  -Augmentin  rx given due to <10 days of symptoms and will cover the most common bacteria that cause sinusitis   Follow up as needed or sooner if new or worsening symptoms Levon Budd FNP student   "

## 2024-07-02 NOTE — Progress Notes (Signed)
 "  Subjective:     Patient ID: [name], female    DOB: Feb 18, 1946, 79 y.o.   MRN: [id number]  Chief Complaint  Patient presents with   Sinus Problem    Patient complains of sinus pressure and pain   Urine odor    Patient reports urine odor and frequency    HPI  Discussed the use of AI scribe software for clinical note transcription with the patient, who gave verbal consent to proceed.  History of Present Illness [name] is a 79 year old female who presents with persistent headache and sinus symptoms.  She has been experiencing a persistent headache for the past three to four weeks, rated as an 8 out of 10 in severity. The headache is located in the midsection of her head and radiates to the right side. She describes a sensation of swelling in her right eye, although no visible swelling is noted. Over-the-counter sinus medications and 'Pain Away' provide temporary relief, but symptoms return once the medication wears off.  In addition to the headache, she reports sinus pain and pressure, low energy, and yellow nasal discharge when blowing her nose. She experiences coughing at night without mucus production. No gastrointestinal symptoms are present, but she notes a lack of appetite during this period. She mentions having a sick contact prior to the onset of her symptoms.  She has a history of fluid retention and takes a diuretic, although the specific medication is not identified. She states that she can lose up to seven pounds of fluid weight with this medication. She also reports a pungent, foul odor in her urine, and a urinalysis showed positive nitrites.  During the review of symptoms, she reports tenderness on the right side of her neck. No gastrointestinal symptoms.      There are no preventive care reminders to display for this patient.  Past Medical History:  Diagnosis Date   Allergy    Ankle effusion, left 03/23/2020   Arthritis    spine (07/28/2014)   Blood  dyscrasia    bruises and bleed easily   Breast cancer, left breast (HCC) 07/28/2014   Breast cancer, right breast (HCC) 1995   CHEST XRAY, ABNORMAL 01/04/2008   Qualifier: Diagnosis of  By: Plotnikov MD, Karlynn GAILS    Chronic back pain    all over   Chronic cough 12/07/2007   Could be related to GERD - relapsed    COAGULOPATHY 07/12/2008   Qualifier: Diagnosis of  By: Kowalk CMA (AAMA), Leisha     Complication of anesthesia    went to sleep easily but hard to wake up up until elbow OR in 2010   CVA (cerebral vascular accident) (HCC)    2022   DDD (degenerative disc disease), cervical 07/09/2017   DDD (degenerative disc disease), lumbar 07/09/2017   With spinal stenosis.   Dr. Malcolm   Depression    Dizziness 10/27/2020   Dyspnea    Normal Spirometry 03/2008 EF 65% BNP normal 11/2007   Episode of recurrent major depressive disorder 12/05/2017   Essential hypertension 02/27/2007   Mild Off meds 2013  BP Readings from Last 3 Encounters:  02/10/12 128/90  08/09/11 134/62  03/06/11 150/86      Family history of breast cancer    Family history of colon cancer    Family history of pancreatic cancer    Fibromyalgia 02/27/2007   Chronic    Gastric polyps    GERD 02/27/2007   S/p Nissen's  2010    GERD (gastroesophageal reflux disease)    History of chicken pox    History of hiatal hernia    Hx of adenomatous polyp of colon 07/03/2015   Kidney stone    right kidney    Lichen sclerosus    Malignant neoplasm of upper-outer quadrant of left breast in female, 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   Medicare annual wellness visit, subsequent 06/30/2016   Melanoma (HCC) 2010   right elbow; [facility] at [facility]   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 ([facility])   Mild anxiety    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   Peripheral edema 11/26/2016   Peripheral neuropathy 01/01/2013   Primary osteoarthritis of both hands 07/09/2017   Primary osteoarthritis of  both knees 07/09/2017   Chondromalacia patella   Rheumatoid arthritis (HCC) 11/02/2018   S/P laparoscopic fundoplication 08/09/2011   Steatosis of liver 12/07/2009   Qualifier: Diagnosis of  By: Surface RN, Donna     Viral URI with cough 09/11/2014   Vitamin B12 deficiency    VITAMIN B12 DEFICIENCY 02/27/2007   Chronic    Vitamin D  deficiency     Past Surgical History:  Procedure Laterality Date   BREAST BIOPSY Left 04/2014   BREAST RECONSTRUCTION WITH PLACEMENT OF TISSUE EXPANDER AND FLEX HD (ACELLULAR HYDRATED DERMIS) Left 07/28/2014   Procedure: LEFT BREAST RECONSTRUCTION PLACEMENT OF LEFT TISSUE EXPANDER ;  Surgeon: Alm Sick, MD;  Location: [facility] OR;  Service: Plastics;  Laterality: Left;   BREAST RECONSTRUCTION WITH PLACEMENT OF TISSUE EXPANDER AND FLEX HD (ACELLULAR HYDRATED DERMIS) Left 09/08/2014   Procedure: REMOVAL OF TISSUE EXPANDER FROM LEFT BREAST;  Surgeon: Alm Sick, MD;  Location: [facility] OR;  Service: Plastics;  Laterality: Left;   BUNIONECTOMY Bilateral 1970's   COLONOSCOPY     Dr Jakie   CYSTOSCOPY WITH RETROGRADE PYELOGRAM, URETEROSCOPY AND STENT PLACEMENT Right 06/09/2015   Procedure: CYSTOSCOPY WITH RIGHT RETROGRADE PYELOGRAM, RIGHT URETEROSCOPY AND RIGHT URTERAL STENT PLACEMENT;  Surgeon: Morene LELON Salines, MD;  Location: [facility] ORS;  Service: Urology;  Laterality: Right;   ESOPHAGOGASTRODUODENOSCOPY     HERNIA REPAIR     HOLMIUM LASER APPLICATION Right 06/09/2015   Procedure: HOLMIUM LASER APPLICATION;  Surgeon: Morene LELON Salines, MD;  Location: [facility] ORS;  Service: Urology;  Laterality: Right;   LATISSIMUS FLAP TO BREAST Left 09/08/2014   Procedure: LEFT LATISSIMUS FLAP TO BREAST WITH SALINE IMPLANT FOR BREAST RECONSTRUCTION;  Surgeon: Alm Sick, MD;  Location: [facility] OR;  Service: Plastics;  Laterality: Left;   LUMBAR FUSION  01/2018   L5-S1 transitional segmental laminectomy and Fusion   MASTECTOMY Right 1996    chemotherapy. pt. states 13 lymph nodes were removed    MASTECTOMY COMPLETE / SIMPLE W/ SENTINEL NODE BIOPSY Left 07/28/2014   MASTECTOMY W/ SENTINEL NODE BIOPSY Left 07/28/2014   Procedure: LEFT MASTECTOMY WITH SENTINEL LYMPH NODE MAPPING;  Surgeon: Deward Null III, MD;  Location: [facility] OR;  Service: General;  Laterality: Left;   MELANOMA EXCISION Right 2010   From elbow-- Done at [facility]    NISSEN FUNDOPLICATION  09/2008   RECONSTRUCTION BREAST IMMEDIATE / DELAYED W/ TISSUE EXPANDER Left 07/28/2014   TEMPOROMANDIBULAR JOINT SURGERY Bilateral 1987   TONSILLECTOMY      Family History  Problem Relation Age of Onset   Stroke Mother    Colon polyps Father        between 10-20   Dementia Father    CAD Father  CABG at age 62   Pancreatic cancer Brother 24   Colon polyps Brother        between 10-20   Cancer Paternal Uncle        blood cancer   Anemia Paternal Grandfather        pernicious anemia   Colon cancer Cousin 62       double first cousin   Cancer Cousin        breast   Colon cancer Cousin 42       double first cousin   Cancer Cousin    Breast cancer Cousin 80       double first cousin   Stroke Other        F 1st degree relative 40, M 1st degree relative   Esophageal cancer Neg Hx    Stomach cancer Neg Hx     Social History   Socioeconomic History   Marital status: Married    Spouse name: Not on file   Number of children: 2   Years of education: Not on file   Highest education level: Not on file  Occupational History   Occupation: Production Designer, Theatre/television/film  Tobacco Use   Smoking status: Never   Smokeless tobacco: Never   Tobacco comments:    Regular Exercise - Yes  Vaping Use   Vaping status: Never Used  Substance and Sexual Activity   Alcohol use: No   Drug use: No   Sexual activity: Yes  Other Topics Concern   Not on file  Social History Narrative   She lives with husband.     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         Right Handed    Lives in a one story home  with a basement    Social Drivers of Health   Tobacco Use: Low Risk (04/09/2024)   Patient History    Smoking Tobacco Use: Never    Smokeless Tobacco Use: Never    Passive Exposure: Not on file  Financial Resource Strain: Low Risk (10/06/2023)   Overall Financial Resource Strain (CARDIA)    Difficulty of Paying Living Expenses: Not very hard  Food Insecurity: No Food Insecurity (10/06/2023)   Hunger Vital Sign    Worried About Running Out of Food in the Last Year: Never true    Ran Out of Food in the Last Year: Never true  Transportation Needs: No Transportation Needs (10/06/2023)   PRAPARE - Administrator, Civil Service (Medical): No    Lack of Transportation (Non-Medical): No  Physical Activity: Unknown (10/06/2023)   Exercise Vital Sign    Days of Exercise per Week: 0 days    Minutes of Exercise per Session: Not on file  Stress: No Stress Concern Present (10/06/2023)   [employer] of Occupational Health - Occupational Stress Questionnaire    Feeling of Stress : Not at all  Social Connections: Socially Integrated (10/06/2023)   Social Connection and Isolation Panel    Frequency of Communication with Friends and Family: More than three times a week    Frequency of Social Gatherings with Friends and Family: Twice a week    Attends Religious Services: More than 4 times per year    Active Member of [employer] or Organizations: Yes    Attends Banker Meetings: More than 4 times per year    Marital Status: Married  Catering Manager Violence: Not At Risk (10/06/2023)  Humiliation, Afraid, Rape, and Kick questionnaire    Fear of Current or Ex-Partner: No    Emotionally Abused: No    Physically Abused: No    Sexually Abused: No  Depression (PHQ2-9): Medium Risk (04/09/2024)   Depression (PHQ2-9)    PHQ-2 Score: 5  Alcohol Screen: Low Risk (10/06/2023)   Alcohol Screen    Last Alcohol Screening Score (AUDIT): 0  Housing: Low Risk (10/06/2023)   Housing  Stability Vital Sign    Unable to Pay for Housing in the Last Year: No    Number of Times Moved in the Last Year: 0    Homeless in the Last Year: No  Utilities: Not At Risk (10/06/2023)   AHC Utilities    Threatened with loss of utilities: No  Health Literacy: Adequate Health Literacy (10/06/2023)   B1300 Health Literacy    Frequency of need for help with medical instructions: Never    Outpatient Medications Prior to Visit  Medication Sig Dispense Refill   aspirin  EC 81 MG tablet Take 81 mg by mouth daily. Swallow whole.     cholecalciferol (VITAMIN D3) 25 MCG (1000 UNIT) tablet Take 1,000 Units by mouth daily. Patient takes 2000 units     citalopram  (CELEXA ) 20 MG tablet Take 1 tablet (20 mg total) by mouth every morning. 90 tablet 1   Cyanocobalamin  (VITAMIN B-12) 5000 MCG TBDP Take by mouth.     Evolocumab  (REPATHA  SURECLICK) 140 MG/ML SOAJ Inject 140 mg into the skin every 14 (fourteen) days. Please apply healthwell grant 6 mL 3   metoCLOPramide  (REGLAN ) 5 MG tablet TAKE 1 TABLET BY MOUTH THREE TIMES DAILY BEFORE MEAL(S) 90 tablet 0   omeprazole  (PRILOSEC) 40 MG capsule Take 1 capsule (40 mg total) by mouth 2 (two) times daily before a meal. 30 minutes before breakfast and supper 180 capsule 3   omeprazole  (PRILOSEC) 40 MG capsule Take 1 capsule (40 mg total) by mouth 2 (two) times daily. 180 capsule 3   amLODipine  (NORVASC ) 5 MG tablet Take 1 tablet (5 mg total) by mouth daily. 30 tablet 1   furosemide  (LASIX ) 40 MG tablet Take 1 tablet (40 mg total) by mouth every morning. (Patient taking differently: Take 40 mg by mouth every other day.) 90 tablet 1   No facility-administered medications prior to visit.    Allergies[1]  ROS See HPI    Objective:    Physical Exam Constitutional:      General: She is not in acute distress.    Appearance: Normal appearance. She is well-developed.  HENT:     Head: Normocephalic and atraumatic.     Right Ear: Tympanic membrane, ear canal and  external ear normal.     Left Ear: Tympanic membrane, ear canal and external ear normal.     Nose:     Right Sinus: Maxillary sinus tenderness and frontal sinus tenderness present.     Left Sinus: Maxillary sinus tenderness and frontal sinus tenderness present.     Mouth/Throat:     Pharynx: Oropharynx is clear. No oropharyngeal exudate.  Eyes:     General: No scleral icterus. Neck:     Thyroid : No thyromegaly.  Cardiovascular:     Rate and Rhythm: Normal rate and regular rhythm.     Heart sounds: Normal heart sounds. No murmur heard. Pulmonary:     Effort: Pulmonary effort is normal. No respiratory distress.     Breath sounds: Normal breath sounds. No wheezing.  Musculoskeletal:     Cervical  back: Neck supple.  Skin:    General: Skin is warm and dry.  Neurological:     Mental Status: She is alert and oriented to person, place, and time.  Psychiatric:        Mood and Affect: Mood normal.        Behavior: Behavior normal.        Thought Content: Thought content normal.        Judgment: Judgment normal.      BP (!) 181/88 (BP Location: Left Arm, Patient Position: Sitting, Cuff Size: Large)   Pulse 79   Temp 98.1 F (36.7 C) (Oral)   Resp 16   Ht 5' 5.5 (1.664 m)   Wt 184 lb (83.5 kg)   LMP  (LMP Unknown)   SpO2 97%   BMI 30.15 kg/m  Wt Readings from Last 3 Encounters:  07/02/24 184 lb (83.5 kg)  04/09/24 181 lb (82.1 kg)  01/22/24 181 lb (82.1 kg)   BP Readings from Last 3 Encounters:  07/02/24 (!) 181/88  04/09/24 133/70  01/22/24 (!) 195/93       Assessment & Plan:   Problem List Items Addressed This Visit       Unprioritized   Urinary frequency - Primary   Urinalysis positive for nitrites. Augmentin  may cover UTI. Culture ordered to confirm diagnosis. - Sent urine for culture. - Monitor for symptoms of UTI and adjust treatment if necessary based on culture results.       Relevant Orders   POCT Urinalysis Dipstick (Automated) (Completed)   Urine  Culture   Essential hypertension   BP very high today.  Looks like she ran out of amlodipine  over the summer. Will restart amlodipine , follow up with PCP in 2 weeks for BP recheck.      Relevant Medications   furosemide  (LASIX ) 40 MG tablet   amLODipine  (NORVASC ) 5 MG tablet   Acute sinusitis    Sinus pain, pressure, headache, and drainage for four weeks. Augmentin  chosen for efficacy. Discussed yeast infection risk with Augmentin . - Prescribed Augmentin  875 mg BID for 7 days. - Advised taking Augmentin  with food. - Continue nasal saline spray and Mucinex. - Use Tylenol  as needed for pain. - Instructed to report if no improvement in headache after completing antibiotics.      Relevant Medications   amoxicillin -clavulanate (AUGMENTIN ) 875-125 MG tablet   Other Visit Diagnoses       Edema, unspecified       Relevant Medications   furosemide  (LASIX ) 40 MG tablet       Assessment & Plan    I have changed [name]'s furosemide . I am also having her start on amoxicillin -clavulanate. Additionally, I am having her maintain her aspirin  EC, cholecalciferol, Vitamin B-12, Repatha  SureClick, omeprazole , omeprazole , citalopram , metoCLOPramide , and amLODipine .  Meds ordered this encounter  Medications   furosemide  (LASIX ) 40 MG tablet    Sig: Take 1 tablet (40 mg total) by mouth every other day.    Supervising Provider:   DOMENICA BLACKBIRD A [[id number]]   amoxicillin -clavulanate (AUGMENTIN ) 875-125 MG tablet    Sig: Take 1 tablet by mouth 2 (two) times daily.    Dispense:  14 tablet    Refill:  0    Supervising Provider:   DOMENICA BLACKBIRD A [[id number]]   amLODipine  (NORVASC ) 5 MG tablet    Sig: Take 1 tablet (5 mg total) by mouth daily.    Dispense:  90 tablet    Refill:  0  Supervising Provider:   DOMENICA BLACKBIRD A [[id number]]      [1]  Allergies Allergen Reactions   Ace Inhibitors Other (See Comments)    REACTION: angioedema   Omega-3 Anaphylaxis and Swelling    Glue (eye lash glue,  gorilla glue)   Other Anaphylaxis and Swelling    Glue (eye lash glue, gorilla glue)   Silodosin Rash    Facial rash   Buprenorphine Hcl Other (See Comments)    crazy   Codeine Rash   Morphine  Other (See Comments)    crazy crazy   Morphine  And Codeine Other (See Comments)    crazy crazy   Sulfonamide Derivatives Rash   Telmisartan Rash   "

## 2024-07-02 NOTE — Assessment & Plan Note (Signed)
 Urinalysis positive for nitrites. Augmentin  may cover UTI. Culture ordered to confirm diagnosis. - Sent urine for culture. - Monitor for symptoms of UTI and adjust treatment if necessary based on culture results.

## 2024-07-02 NOTE — Assessment & Plan Note (Signed)
 BP very high today.  Looks like she ran out of amlodipine  over the summer. Will restart amlodipine , follow up with PCP in 2 weeks for BP recheck.

## 2024-07-02 NOTE — Telephone Encounter (Signed)
 Patient was contacted and scheduled to come in 07/16/24 to see pcp

## 2024-07-02 NOTE — Telephone Encounter (Signed)
 Left detailed message on pt's voicemail as follows: Noted after pt's visit this afternoon that her BP was very elevated. It looks like she had been on amlodipine  for a month or two over the summer but refills ran out. Recommended that she restart amlodipine  5mg  and schedule follow up with [place] in 2 weeks.   Levorn, can you please follow back up with her on Monday to make sure she received my message?

## 2024-07-02 NOTE — Assessment & Plan Note (Signed)
" °  Sinus pain, pressure, headache, and drainage for four weeks. Augmentin  chosen for efficacy. Discussed yeast infection risk with Augmentin . - Prescribed Augmentin  875 mg BID for 7 days. - Advised taking Augmentin  with food. - Continue nasal saline spray and Mucinex. - Use Tylenol  as needed for pain. - Instructed to report if no improvement in headache after completing antibiotics. "

## 2024-07-02 NOTE — Patient Instructions (Signed)
" °  VISIT SUMMARY: During your visit, we discussed your persistent headache, sinus symptoms, and urinary concerns. You have been experiencing a severe headache and sinus issues for the past few weeks, along with a foul odor in your urine.  YOUR PLAN: -ACUTE SINUSITIS: Acute sinusitis is an infection of the sinuses causing pain, pressure, headache, and drainage. We have prescribed Augmentin  875 mg to be taken twice daily for 7 days. Please take it with food to avoid stomach upset. Continue using nasal saline spray and Mucinex, and use Tylenol  as needed for pain. If your headache does not improve after completing the antibiotics, please let us  know.  -URINARY TRACT INFECTION: A urinary tract infection (UTI) is an infection in any part of your urinary system. Your urinalysis showed positive nitrites, indicating a UTI. We have sent your urine for culture to confirm the diagnosis. Augmentin  may also help treat the UTI. Please monitor your symptoms and we will adjust your treatment if necessary based on the culture results.  HTN- Your BP is high, please restart amlodipine  5mg .   INSTRUCTIONS: Please complete the full course of Augmentin  as prescribed. If your headache does not improve after completing the antibiotics, or if you experience any worsening symptoms, please contact our office. We will follow up with you regarding the results of your urine culture to determine if any changes to your treatment are needed.   Contains text generated by [employer]bridge.   "

## 2024-07-14 ENCOUNTER — Ambulatory Visit: Admitting: Internal Medicine

## 2024-07-16 ENCOUNTER — Ambulatory Visit: Admitting: Family Medicine

## 2024-10-06 ENCOUNTER — Ambulatory Visit
